# Patient Record
Sex: Male | Born: 1965
Health system: Southern US, Community
[De-identification: ages and names within clinical notes are randomized; demographics above are authoritative.]

## PROBLEM LIST (undated history)

## (undated) DIAGNOSIS — F17200 Nicotine dependence, unspecified, uncomplicated: Secondary | ICD-10-CM

## (undated) DIAGNOSIS — I739 Peripheral vascular disease, unspecified: Secondary | ICD-10-CM

## (undated) DIAGNOSIS — Z939 Artificial opening status, unspecified: Secondary | ICD-10-CM

## (undated) DIAGNOSIS — E079 Disorder of thyroid, unspecified: Secondary | ICD-10-CM

## (undated) DIAGNOSIS — I10 Essential (primary) hypertension: Secondary | ICD-10-CM

## (undated) DIAGNOSIS — N289 Disorder of kidney and ureter, unspecified: Secondary | ICD-10-CM

## (undated) DIAGNOSIS — R809 Proteinuria, unspecified: Secondary | ICD-10-CM

## (undated) DIAGNOSIS — E119 Type 2 diabetes mellitus without complications: Secondary | ICD-10-CM

## (undated) DIAGNOSIS — E669 Obesity, unspecified: Secondary | ICD-10-CM

## (undated) DIAGNOSIS — E785 Hyperlipidemia, unspecified: Secondary | ICD-10-CM

## (undated) HISTORY — DX: Essential (primary) hypertension: I10

## (undated) HISTORY — DX: Proteinuria, unspecified: R80.9

## (undated) HISTORY — DX: Hyperlipidemia, unspecified: E78.5

## (undated) HISTORY — DX: Peripheral vascular disease, unspecified: I73.9

## (undated) HISTORY — DX: Artificial opening status, unspecified: Z93.9

## (undated) HISTORY — DX: Obesity, unspecified: E66.9

## (undated) HISTORY — DX: Nicotine dependence, unspecified, uncomplicated: F17.200

## (undated) HISTORY — DX: Type 2 diabetes mellitus without complications: E11.9

---

## 1993-08-16 DIAGNOSIS — E119 Type 2 diabetes mellitus without complications: Secondary | ICD-10-CM

## 1993-08-16 HISTORY — DX: Type 2 diabetes mellitus without complications: E11.9

## 2015-12-15 DIAGNOSIS — E782 Mixed hyperlipidemia: Secondary | ICD-10-CM | POA: Diagnosis not present

## 2015-12-15 DIAGNOSIS — F17200 Nicotine dependence, unspecified, uncomplicated: Secondary | ICD-10-CM | POA: Diagnosis not present

## 2015-12-15 DIAGNOSIS — E1140 Type 2 diabetes mellitus with diabetic neuropathy, unspecified: Secondary | ICD-10-CM | POA: Diagnosis not present

## 2016-02-16 DIAGNOSIS — E782 Mixed hyperlipidemia: Secondary | ICD-10-CM | POA: Diagnosis not present

## 2016-02-16 DIAGNOSIS — Z125 Encounter for screening for malignant neoplasm of prostate: Secondary | ICD-10-CM | POA: Diagnosis not present

## 2016-02-16 DIAGNOSIS — E1140 Type 2 diabetes mellitus with diabetic neuropathy, unspecified: Secondary | ICD-10-CM | POA: Diagnosis not present

## 2016-04-26 DIAGNOSIS — E782 Mixed hyperlipidemia: Secondary | ICD-10-CM | POA: Diagnosis not present

## 2016-04-26 DIAGNOSIS — Z23 Encounter for immunization: Secondary | ICD-10-CM | POA: Diagnosis not present

## 2016-04-26 DIAGNOSIS — E1140 Type 2 diabetes mellitus with diabetic neuropathy, unspecified: Secondary | ICD-10-CM | POA: Diagnosis not present

## 2016-04-26 DIAGNOSIS — Z7984 Long term (current) use of oral hypoglycemic drugs: Secondary | ICD-10-CM | POA: Diagnosis not present

## 2016-04-26 DIAGNOSIS — F17200 Nicotine dependence, unspecified, uncomplicated: Secondary | ICD-10-CM | POA: Diagnosis not present

## 2017-02-15 DIAGNOSIS — E782 Mixed hyperlipidemia: Secondary | ICD-10-CM | POA: Diagnosis not present

## 2017-02-15 DIAGNOSIS — I10 Essential (primary) hypertension: Secondary | ICD-10-CM | POA: Diagnosis not present

## 2017-02-15 DIAGNOSIS — E1140 Type 2 diabetes mellitus with diabetic neuropathy, unspecified: Secondary | ICD-10-CM | POA: Diagnosis not present

## 2017-02-23 ENCOUNTER — Other Ambulatory Visit (HOSPITAL_COMMUNITY): Payer: Self-pay | Admitting: Family Medicine

## 2017-02-23 DIAGNOSIS — I739 Peripheral vascular disease, unspecified: Secondary | ICD-10-CM

## 2017-02-24 ENCOUNTER — Encounter (HOSPITAL_COMMUNITY): Payer: Self-pay

## 2017-03-15 ENCOUNTER — Ambulatory Visit (HOSPITAL_COMMUNITY): Payer: BLUE CROSS/BLUE SHIELD

## 2017-03-21 ENCOUNTER — Ambulatory Visit (HOSPITAL_COMMUNITY)
Admission: RE | Admit: 2017-03-21 | Discharge: 2017-03-21 | Disposition: A | Discharge status: Home / Self Care | Payer: BLUE CROSS/BLUE SHIELD | Source: Ambulatory Visit | Attending: Family Medicine | Admitting: Family Medicine

## 2017-03-21 DIAGNOSIS — I739 Peripheral vascular disease, unspecified: Secondary | ICD-10-CM | POA: Diagnosis not present

## 2017-03-21 NOTE — Progress Notes (Signed)
VASCULAR LAB PRELIMINARY  ARTERIAL  ABI completed:    RIGHT    LEFT    PRESSURE WAVEFORM  PRESSURE WAVEFORM  BRACHIAL 183 Tri BRACHIAL 173 Tri  DP 136 Bi DP 93 Mono  PT 159 Bi PT 92 Mono  GREAT TOE  NA GREAT TOE  NA    RIGHT LEFT  ABI 0.87 0.51   ABI of the right lower extremity demonstrates mild peripheral arterial disease.  ABI of the left lower extremity demonstrates moderate peripheral arterial disease.   Andres Richardson, RVT 03/21/2017, 9:30 AM

## 2017-03-29 ENCOUNTER — Other Ambulatory Visit: Payer: Self-pay

## 2017-03-29 DIAGNOSIS — I739 Peripheral vascular disease, unspecified: Secondary | ICD-10-CM

## 2017-03-30 ENCOUNTER — Encounter: Payer: Self-pay | Admitting: Surgery

## 2017-04-05 ENCOUNTER — Encounter: Payer: Self-pay | Admitting: Vascular Surgery

## 2017-04-06 ENCOUNTER — Encounter: Payer: Self-pay | Admitting: Vascular Surgery

## 2017-04-07 ENCOUNTER — Ambulatory Visit (HOSPITAL_COMMUNITY)
Admission: RE | Admit: 2017-04-07 | Discharge: 2017-04-07 | Disposition: A | Discharge status: Home / Self Care | Payer: BLUE CROSS/BLUE SHIELD | Source: Ambulatory Visit | Attending: Surgery | Admitting: Surgery

## 2017-04-07 ENCOUNTER — Ambulatory Visit (INDEPENDENT_AMBULATORY_CARE_PROVIDER_SITE_OTHER): Payer: BLUE CROSS/BLUE SHIELD | Admitting: Vascular Surgery

## 2017-04-07 ENCOUNTER — Encounter: Payer: Self-pay | Admitting: Vascular Surgery

## 2017-04-07 VITALS — BP 125/79 | HR 60 | Temp 97.5°F | Resp 20 | Ht 71.0 in | Wt 236.0 lb

## 2017-04-07 DIAGNOSIS — Z716 Tobacco abuse counseling: Secondary | ICD-10-CM

## 2017-04-07 DIAGNOSIS — I7409 Other arterial embolism and thrombosis of abdominal aorta: Secondary | ICD-10-CM

## 2017-04-07 DIAGNOSIS — I739 Peripheral vascular disease, unspecified: Secondary | ICD-10-CM | POA: Insufficient documentation

## 2017-04-07 LAB — VAS US LOWER EXTREMITY ARTERIAL DUPLEX
LEFT PERO DIST SYS: 24 cm/s
LPOPDPSV: -52 cm/s
LPOPPPSV: 37 cm/s
LSFDPSV: -56 cm/s
Left ant tibial distal sys: -13 cm/s
Left super femoral mid sys PSV: -55 cm/s
Left super femoral prox sys PSV: -41 cm/s
RIGHT ANT DIST TIBAL SYS PSV: 28 cm/s
RIGHT POST TIB DIST SYS: 68 cm/s
RPERPSV: 30 cm/s
RPOPDPSV: -56 cm/s
RSFMPSV: -86 cm/s
RSFPPSV: -79 cm/s
Right popliteal prox sys PSV: 46 cm/s
Right super femoral dist sys PSV: -70 cm/s
left post tibial dist sys: 52 cm/s

## 2017-04-07 NOTE — Progress Notes (Signed)
Patient name: Andres Richardson MRN: [id number] DOB: 04-03-1966 Sex: male   REASON FOR CONSULT:    Peripheral vascular disease with bilateral lower extremity claudication. The consult is requested by Dr. [name].   HPI:   Andres Richardson is a pleasant 51 y.o. male,  Who is had bilateral lower extreme claudication for about 18 months. He experiences pain in both calves and feet which is brought on by ambulation and relieved with rest. There are no other aggravating or alleviating factors. His symptoms are much worse on the left side. He denies any significant thigh or hip claudication. He also describes rest pain in the left foot.  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 He denies any history of nonhealing wounds.  He also notes some hyperpigmentation in both lower extremities and leg swelling. This is worse when his been driving a lot. He spends long hours on the road.  He does not take aspirin on a regular basis. He does not tolerate statins.  I have reviewed the records that were sent from the referring office. The patient has a history of type 2 diabetes which is controlled on oral medications. The patient also has hypertension and hyperlipidemia.  Past Medical History:  Diagnosis Date  . Diabetes mellitus without complication (HCC)    Type !! 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 Diabetic neuropathy.  . Hyperlipidemia    Mixed  . Hypertension    Essential  . Microalbuminuria   . Obesity   . Peripheral arterial disease (HCC)    With Claudication  . Tobacco use disorder     No family history on file.  There is no family history of premature cardiovascular disease.  SOCIAL HISTORY: He has cut back to 1 pack per day. He had smoked up to 2-1/2 packs per day up to a year ago. He began smoking when he was 51 years old.  Social History   Social  History  . Marital status: Single    Spouse name: N/A  . Number of children: N/A  . Years of education: N/A   Occupational History  . Not on file.   Social History Main Topics  . Smoking status: Current Every Day Smoker    Packs/day: 1.00  . Smokeless tobacco: Never Used     Comment: Uses Nicorette Gum.   . Alcohol use Not on file  . Drug use: Unknown  . Sexual activity: Not on file   Other Topics Concern  . Not on file   Social History Narrative  . No narrative on file    Allergies  Allergen Reactions  . Adhesive [Tape] Rash    Current Outpatient Prescriptions  Medication Sig Dispense Refill  . buPROPion (WELLBUTRIN XL) 150 MG 24 hr tablet Take 150 mg by mouth 2 (two) times daily.    Marland Kitchen GLIPIZIDE PO Take 5 mg by mouth 1 day or 1 dose.    . ibuprofen (ADVIL,MOTRIN) 200 MG tablet Take 200 mg by mouth every 6 (six) hours as needed.    . metFORMIN (GLUCOPHAGE-XR) 750 MG 24 hr tablet Take 750 mg by mouth 2 (two) times daily.    . nicotine polacrilex (NICORETTE) 4 MG gum Take 4 mg by mouth as needed for smoking cessation.    . sildenafil (REVATIO) 20 MG tablet Take 20 mg by  mouth 3 (three) times daily. 2-5 tab Prn    . sildenafil (VIAGRA) 25 MG tablet Take 25 mg by mouth daily as needed for erectile dysfunction.     No current facility-administered medications for this visit.     REVIEW OF SYSTEMS:  [X]  denotes positive finding, [ ]  denotes negative finding Cardiac  Comments:  Chest pain or chest pressure:    Shortness of breath upon exertion:    Short of breath when lying flat:    Irregular heart rhythm:        Vascular    Pain in calf, thigh, or hip brought on by ambulation: X   Pain in feet at night that wakes you up from your sleep:  X   Blood clot in your veins:    Leg swelling:  X       Pulmonary    Oxygen at home:    Productive cough:     Wheezing:         Neurologic    Sudden weakness in arms or legs:     Sudden numbness in arms or legs:     Sudden  onset of difficulty speaking or slurred speech:    Temporary loss of vision in one eye:     Problems with dizziness:         Gastrointestinal    Blood in stool:     Vomited blood:         Genitourinary    Burning when urinating:     Blood in urine:        Psychiatric    Major depression:         Hematologic    Bleeding problems:    Problems with blood clotting too easily:        Skin    Rashes or ulcers:        Constitutional    Fever or chills:     PHYSICAL EXAM:   Vitals:   04/07/17 1414  BP: 125/79  Pulse: 60  Resp: 20  Temp: (!) 97.5 F (36.4 C)  TempSrc: Oral  SpO2: 97%  Weight: 236 lb (107 kg)  Height: 5\' 11"  (1.803 m)    GENERAL: The patient is a well-nourished male, in no acute distress. The vital signs are documented above. CARDIAC: There is a regular rate and rhythm.  VASCULAR: I do not detect carotid bruits. I cannot palpate femoral, popliteal, or pedal pulses bilaterally. He has no significant lower extremity swelling. He has hyperpigmentation bilaterally consistent with chronic venous insufficiency. PULMONARY: There is good air exchange bilaterally without wheezing or rales. ABDOMEN: Soft and non-tender with normal pitched bowel sounds. Because of his obesity, it is difficult to palpate for an aneurysm. I cannot palpate an aneurysm. MUSCULOSKELETAL: There are no major deformities or cyanosis. NEUROLOGIC: No focal weakness or paresthesias are detected. SKIN: There are no ulcers or rashes noted. PSYCHIATRIC: The patient has a normal affect.  DATA:    ARTERIAL DOPPLER STUDY: I have reviewed the arterial Doppler study that was done on 03/21/2017 at [facility].  On the right side, there was a biphasic dorsalis pedis and posterior tibial signal with an ABI of 87%.  On the left side it was a monophasic dorsalis pedis and posterior tibial signal with an ABI of 51%.  ARTERIAL DUPLEX: I have independently interpreted his bilateral lower extremity arterial  duplex can today.  On the right side there are monophasic Doppler signals from the common femoral artery distally.  On  the left side there are monophasic Doppler signals from the common femoral artery distally.  By duplex there is no significant infrainguinal arterial occlusive disease noted bilaterally. The patient has evidence of aortoiliac occlusive disease.  MEDICAL ISSUES:   AORTOILIAC OCCLUSIVE DISEASE: Based on his exam and noninvasive studies I think the patient has significant aortoiliac occlusive disease. I cannot palpate femoral pulses. Given his having rest pain and disabling claudication, I think that we should consider revascularization. I've explained that normally I would recommend an arteriogram. However given that he has absent femoral pulses I think would be better to start with a CT angiogram. If this shows that he has disease amenable to angioplasty then certainly we could consider this. Otherwise it would provide a roadmap for possible aortobifemoral bypass grafting. Certainly if her heart surgery he would need preoperative cardiac evaluation. We have also discussed the option of conservative treatment which would be to try to get off the cigarettes completely, which she is working on, and a structured walking program. However symptoms are significantly disabling and he progressed to the point of rest pain so that I feel that this option is likely not going to be successful.  He currently does not take aspirin and I have instructed him to begin taking 81 mg of aspirin daily. He does not tolerate statins.  He will have to work around his work schedule but hopefully can schedule a CT Carlyon Prows in the near future. I'll see him back after discussed these results and make further recommendations.  CHRONIC VENOUS INSUFFICIENCY: Based on his exam, he does have evidence of chronic venous insufficiency with hyperpigmentation bilaterally. I've explained that normally the treatment for  this would be intermittent leg elevation and compression stockings. However, given his severe peripheral vascular disease, he really cannot elevate his legs. Therefore we will simply follow this and if he ultimately returned undergoes revascularization we can rediscuss these conservative measures for the treatment of his chronic venous insufficiency. 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  We have also had a long discussion about the importance of tobacco cessation. I spent more than 3 minutes on this conversation.  Waverly Ferrari [facility] [place] [phone]

## 2017-04-07 NOTE — Addendum Note (Signed)
Addended by: Burton Apley A on: 04/07/2017 03:22 PM   Modules accepted: Orders

## 2017-04-11 ENCOUNTER — Other Ambulatory Visit: Payer: BLUE CROSS/BLUE SHIELD

## 2017-04-13 ENCOUNTER — Ambulatory Visit: Payer: BLUE CROSS/BLUE SHIELD | Admitting: Vascular Surgery

## 2017-05-04 ENCOUNTER — Ambulatory Visit
Admission: RE | Admit: 2017-05-04 | Discharge: 2017-05-04 | Disposition: A | Discharge status: Home / Self Care | Payer: BLUE CROSS/BLUE SHIELD | Source: Ambulatory Visit | Attending: Vascular Surgery | Admitting: Vascular Surgery

## 2017-05-04 ENCOUNTER — Ambulatory Visit (INDEPENDENT_AMBULATORY_CARE_PROVIDER_SITE_OTHER): Payer: BLUE CROSS/BLUE SHIELD | Admitting: Vascular Surgery

## 2017-05-04 ENCOUNTER — Encounter: Payer: Self-pay | Admitting: Vascular Surgery

## 2017-05-04 VITALS — BP 150/86 | HR 97 | Temp 97.5°F | Resp 20 | Ht 71.0 in | Wt 237.0 lb

## 2017-05-04 DIAGNOSIS — I7409 Other arterial embolism and thrombosis of abdominal aorta: Secondary | ICD-10-CM

## 2017-05-04 DIAGNOSIS — R161 Splenomegaly, not elsewhere classified: Secondary | ICD-10-CM | POA: Diagnosis not present

## 2017-05-04 MED ORDER — IOPAMIDOL (ISOVUE-370) INJECTION 76%
125.0000 mL | Freq: Once | INTRAVENOUS | Status: AC | PRN
  Administered 2017-05-04: 125 mL via INTRAVENOUS

## 2017-05-04 NOTE — Progress Notes (Signed)
Patient name: Andres Richardson MRN: [id number] DOB: 12-Jul-1966 Sex: male  REASON FOR VISIT:    Follow up of aortoiliac occlusive disease.  HPI:   Andres Richardson is a pleasant 51 y.o. male who I saw in consultation on 04/07/2017 with bilateral lower extremity claudication. The symptoms had been going on for 18 months. Arterial Doppler study showed an ABI of 87% on the right and 51% on the left. I could not palpate femoral pulses. I recommended a CT angiogram. I also encouraged him to begin taking aspirin daily. We also discussed the importance of tobacco cessation. He also had evidence of chronic venous insufficiency.  Since I saw him last his pain remains stable. He has claudication of both thighs and calves which is brought on by ambulation and relieved with rest. He also has some mild rest pain bilaterally. His symptoms are worse on the left side.  He is continuing to work on tobacco cessation. He had smoked 2-1/2 packs per day but has cut back to 8-9 cigarettes a day. 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 20 laps around the truck as 1 mile. He is able to walk 6-7 laps around his truck.   Past Medical History:  Diagnosis Date  . Diabetes mellitus without complication (HCC)    Type !! 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 Diabetic neuropathy.  . Hyperlipidemia    Mixed  . Hypertension    Essential  . Microalbuminuria   . Obesity   . Peripheral arterial disease (HCC)    With Claudication  . Tobacco use disorder     No family history on file.  SOCIAL HISTORY: Social History  Substance Use Topics  . Smoking status: Current Every Day Smoker    Packs/day: 1.00  . Smokeless tobacco: Never Used     Comment: Uses Nicorette Gum.   . Alcohol use Not on file    Allergies  Allergen Reactions  . Adhesive [Tape] Rash    Current Outpatient Prescriptions  Medication Sig Dispense Refill  . buPROPion (WELLBUTRIN XL) 150 MG 24 hr tablet Take 150 mg by mouth 2  (two) times daily.    Marland Kitchen GLIPIZIDE PO Take 5 mg by mouth 1 day or 1 dose.    . ibuprofen (ADVIL,MOTRIN) 200 MG tablet Take 200 mg by mouth every 6 (six) hours as needed.    . metFORMIN (GLUCOPHAGE-XR) 750 MG 24 hr tablet Take 750 mg by mouth 2 (two) times daily.    . nicotine polacrilex (NICORETTE) 4 MG gum Take 4 mg by mouth as needed for smoking cessation.    . sildenafil (REVATIO) 20 MG tablet Take 20 mg by mouth 3 (three) times daily. 2-5 tab Prn    . sildenafil (VIAGRA) 25 MG tablet Take 25 mg by mouth daily as needed for erectile dysfunction.     No current facility-administered medications for this visit.     REVIEW OF SYSTEMS:   denotes positive finding,  denotes negative finding Cardiac  Comments:  Chest pain or chest pressure:    Shortness of breath upon exertion:    Short of breath when lying flat:    Irregular heart rhythm:        Vascular    Pain in calf, thigh, or hip brought on by ambulation:    Pain in feet at night that wakes you up from your sleep:     Blood clot in your veins:    Leg swelling:  Pulmonary    Oxygen at home:    Productive cough:     Wheezing:         Neurologic    Sudden weakness in arms or legs:     Sudden numbness in arms or legs:     Sudden onset of difficulty speaking or slurred speech:    Temporary loss of vision in one eye:     Problems with dizziness:         Gastrointestinal    Blood in stool:     Vomited blood:         Genitourinary    Burning when urinating:     Blood in urine:        Psychiatric    Major depression:         Hematologic    Bleeding problems:    Problems with blood clotting too easily:        Skin    Rashes or ulcers:        Constitutional    Fever or chills:     PHYSICAL EXAM:   Vitals:   05/04/17 1132  BP: (!) 150/86  Pulse: 97  Resp: 20  Temp: (!) 97.5 F (36.4 C)  SpO2: 99%  Weight: 237 lb (107.5 kg)  Height:  (1.803 m)    GENERAL: The patient is a well-nourished  male, in no acute distress. The vital signs are documented above. CARDIAC: There is a regular rate and rhythm.  VASCULAR: I do not detect carotid bruits. I cannot palpate femoral pulses.  I cannot palpate pedal pulses. PULMONRY: There is good air exchange bilaterally without wheezing or rales. ABDOMEN: Soft and non-tender with normal pitched bowel sounds.  MUSCULOSKELETAL: There are no major deformities or cyanosis. NEUROLOGIC: No focal weakness or paresthesias are detected. SKIN: There are no ulcers or rashes noted. PSYCHIATRIC: The patient has a normal affect.  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 He has mild arterial disease on the right with extensive plaque in the abdominal aorta also.  MEDICAL ISSUES:   AORTOILIAC OCCLUSIVE DISEASE:  This patient has diffuse disease of his infrarenal aorta although no critical stenosis. The right common iliac artery has some moderate diffuse disease but the artery appears patent. However I am unable to palpate a right femoral pulse. 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 We have discussed the options today in the office. Conservative treatment would include continued efforts to get off nicotine completely. We've also discussed the importance of continuing to walk as much as possible and again the importance of nutrition.   If this is not successful then we could consider arteriography and potentially he could have an endovascular approach on the right opening up the option for a right to left femorofemoral bypass graft. However, if he has significant infrarenal aortic disease and this would compromise the inflow on the right and the other consideration would be aortofemoral bypass grafting. We have discussed the risks and benefits of all these options and he is agreeable to attempt a conservative  approach. He is not really anxious to consider aortobifemoral bypass grafting at this point. If he does ultimately come to that and certainly he would require preoperative cardiac clearance.   I have ordered follow up ABIs in 6 months and I'll see him back at that time. He  knows to call sooner if he has problems.   Waverly Ferrari [facility] [place] [phone]

## 2017-05-05 NOTE — Addendum Note (Signed)
Addended by: Burton Apley A on: 05/05/2017 03:55 PM   Modules accepted: Orders

## 2017-05-16 ENCOUNTER — Encounter (HOSPITAL_COMMUNITY): Payer: BLUE CROSS/BLUE SHIELD

## 2017-05-16 ENCOUNTER — Encounter: Payer: BLUE CROSS/BLUE SHIELD | Admitting: Surgery

## 2017-09-02 DIAGNOSIS — H52223 Regular astigmatism, bilateral: Secondary | ICD-10-CM | POA: Diagnosis not present

## 2017-11-02 ENCOUNTER — Encounter: Payer: Self-pay | Admitting: Vascular Surgery

## 2017-11-02 ENCOUNTER — Other Ambulatory Visit: Payer: Self-pay

## 2017-11-02 ENCOUNTER — Ambulatory Visit (HOSPITAL_COMMUNITY)
Admission: RE | Admit: 2017-11-02 | Discharge: 2017-11-02 | Disposition: A | Discharge status: Home / Self Care | Payer: BLUE CROSS/BLUE SHIELD | Source: Ambulatory Visit | Attending: Vascular Surgery | Admitting: Vascular Surgery

## 2017-11-02 ENCOUNTER — Ambulatory Visit: Payer: BLUE CROSS/BLUE SHIELD | Admitting: Vascular Surgery

## 2017-11-02 VITALS — BP 153/89 | HR 87 | Resp 20 | Ht 71.0 in | Wt 235.0 lb

## 2017-11-02 DIAGNOSIS — I7409 Other arterial embolism and thrombosis of abdominal aorta: Secondary | ICD-10-CM | POA: Diagnosis not present

## 2017-11-02 NOTE — Progress Notes (Signed)
Patient name: Andres Richardson MRN: [id number] DOB: 1965-11-08 Sex: male  REASON FOR VISIT:   Follow-up of aortoiliac occlusive disease  HPI:   Andres Richardson is a pleasant 52 y.o. male who I last saw on 05/04/2017.  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  He had mild arterial disease on the right with extensive plaque in the abdominal aorta also.  There was diffuse infrarenal aortic disease but no critical stenosis.  The right common iliac artery had some moderate diffuse disease but was patent.  I was unable to palpate femoral pulses on either side.  We discussed the importance of tobacco cessation.  My feeling at that time was that we should try conservative treatment but that if this was not successful we could consider arteriography and possibly an endovascular approach on the right opening up the option for a right to left femorofemoral bypass.  The other consideration would be aortofemoral bypass grafting.  Since I saw him last his symptoms remain stable.  He experiences pain mostly in his feet which is brought on by ambulation and relieved with rest.  The symptoms have not progressed over the last 6 months.  He denies significant hip or thigh claudication although he occasionally gets some pain in the left hip which sounds potentially like arthritic pain.  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  He denies any history of rest pain or nonhealing ulcers.  Of note the places he has been traveling recently are quite cold to he is not been walking as much lately.  He does take 81 mg of aspirin a day although this is not listed on his medications.  He is not on a statin.  He continues to try to work on tobacco cessation.  He had smoked 2-1/2 packs/day for a long time but is cut back to 8-12 cigarettes a day.  He denies any  previous history of DVT or phlebitis.  Past Medical History:  Diagnosis Date  . Diabetes mellitus without complication (HCC)    Type !! 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 Diabetic neuropathy.  . Hyperlipidemia    Mixed  . Hypertension    Essential  . Microalbuminuria   . Obesity   . Peripheral arterial disease (HCC)    With Claudication  . Tobacco use disorder     History reviewed. No pertinent family history.  SOCIAL HISTORY: Currently 8-12 cigarettes a day. Social History   Tobacco Use  . Smoking status: Current Every Day Smoker    Packs/day: 1.00  . Smokeless tobacco: Never Used  . Tobacco comment: Uses Nicorette Gum.   Substance Use Topics  . Alcohol use: Not on file    Allergies  Allergen Reactions  . Adhesive [Tape] Rash    Current Outpatient Medications  Medication Sig Dispense Refill  . buPROPion (WELLBUTRIN XL) 150 MG 24 hr tablet Take 150 mg by mouth 2 (two) times daily.    Marland Kitchen GLIPIZIDE PO Take 5 mg by mouth 1 day or 1 dose.    . ibuprofen (ADVIL,MOTRIN) 200 MG tablet Take 200 mg by mouth every 6 (six) hours as needed.    . metFORMIN (GLUCOPHAGE-XR) 750 MG 24 hr tablet Take 750 mg by mouth 2 (two) times daily.    . nicotine polacrilex (NICORETTE) 4 MG gum Take 4 mg by mouth as  needed for smoking cessation.    . sildenafil (REVATIO) 20 MG tablet Take 20 mg by mouth 3 (three) times daily. 2-5 tab Prn    . sildenafil (VIAGRA) 25 MG tablet Take 25 mg by mouth daily as needed for erectile dysfunction.     No current facility-administered medications for this visit.     REVIEW OF SYSTEMS:  [X]  denotes positive finding, [ ]  denotes negative finding Cardiac  Comments:  Chest pain or chest pressure:    Shortness of breath upon exertion:    Short of breath when lying flat:    Irregular heart rhythm:        Vascular    Pain in calf, thigh, or hip brought on by ambulation: x   Pain in feet at night that wakes you up from your sleep:  x   Blood clot  in your veins:    Leg swelling:  x       Pulmonary    Oxygen at home:    Productive cough:     Wheezing:         Neurologic    Sudden weakness in arms or legs:     Sudden numbness in arms or legs:     Sudden onset of difficulty speaking or slurred speech:    Temporary loss of vision in one eye:     Problems with dizziness:         Gastrointestinal    Blood in stool:     Vomited blood:         Genitourinary    Burning when urinating:     Blood in urine:        Psychiatric    Major depression:         Hematologic    Bleeding problems:    Problems with blood clotting too easily:        Skin    Rashes or ulcers:        Constitutional    Fever or chills:     PHYSICAL EXAM:   Vitals:   11/02/17 0943 11/02/17 0946  BP: (!) 155/88 (!) 153/89  Pulse: 87   Resp: 20   SpO2: 98%   Weight: 235 lb (106.6 kg)   Height: 5\' 11"  (1.803 m)     GENERAL: The patient is a well-nourished male, in no acute distress. The vital signs are documented above. CARDIAC: There is a regular rate and rhythm.  VASCULAR: I do not detect carotid bruits. On the right side he has a palpable femoral pulse.  It is slightly diminished.  I cannot palpate a popliteal or pedal pulses. On the left side I cannot palpate a femoral pulse.  I cannot palpate pedal pulses.  Both feet are warm and well perfused. PULMONARY: There is good air exchange bilaterally without wheezing or rales. ABDOMEN: Soft and non-tender with normal pitched bowel sounds.  I do not palpate an abdominal aortic aneurysm. MUSCULOSKELETAL: There are no major deformities or cyanosis. NEUROLOGIC: No focal weakness or paresthesias are detected. SKIN: There are no ulcers or rashes noted.  He does have some hyperpigmentation bilaterally consistent with chronic venous insufficiency. PSYCHIATRIC: The patient has a normal affect.  DATA:    ARTERIAL DOPPLER STUDY: I have independently interpreted his arterial Doppler study.  On the right  side has a monophasic dorsalis pedis signal with a biphasic posterior tibial signal.  ABI is 84%.  Toe pressure is 85 mmHg.  On the left side, he has a  monophasic posterior tibial signal.  A dorsalis pedis signal cannot be obtained.  ABI  is 55%.  Toe pressure is 52 mmHg  MEDICAL ISSUES:   MULTILEVEL ARTERIAL OCCLUSIVE DISEASE: This patient has aortoiliac occlusive disease with a left common iliac artery occlusion and also infrainguinal arterial occlusive disease on the right.  When I saw him previously we discussed conservative treatment with hopes of avoiding aortofemoral bypass grafting or an endovascular approach to the right iliac disease with a femorofemoral bypass.  His symptoms have improved since last time he continues to work on tobacco cessation.  We again discussed the importance of exercise and maintaining a structured walking routine.  We also discussed the importance of nutrition.  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  I think this is very encouraging.  I have encouraged him to try to get off the cigarettes completely.  Given that his symptoms are stable I have stretched his follow-up out to 1 year.  I have ordered follow-up ABIs at that time and I will see him back at that time.  He knows to call sooner if he has problems.    CHRONIC VENOUS INSUFFICIENCY: On exam he does have some evidence of chronic venous insufficiency.  I encouraged him to elevate his legs some on long trips if possible and also to consider wearing compression stockings.  He did not want a prescription for these today.  I will see him back in 1 year.  He knows to call sooner if he has problems.    Waverly Ferrari [facility] [phone]

## 2018-04-01 DIAGNOSIS — J811 Chronic pulmonary edema: Secondary | ICD-10-CM | POA: Diagnosis not present

## 2018-04-01 DIAGNOSIS — S36530A Laceration of ascending [right] colon, initial encounter: Secondary | ICD-10-CM | POA: Diagnosis not present

## 2018-04-01 DIAGNOSIS — J181 Lobar pneumonia, unspecified organism: Secondary | ICD-10-CM | POA: Diagnosis not present

## 2018-04-01 DIAGNOSIS — R0989 Other specified symptoms and signs involving the circulatory and respiratory systems: Secondary | ICD-10-CM | POA: Diagnosis not present

## 2018-04-01 DIAGNOSIS — J810 Acute pulmonary edema: Secondary | ICD-10-CM | POA: Diagnosis not present

## 2018-04-01 DIAGNOSIS — J9584 Transfusion-related acute lung injury (TRALI): Secondary | ICD-10-CM | POA: Diagnosis not present

## 2018-04-01 DIAGNOSIS — M40204 Unspecified kyphosis, thoracic region: Secondary | ICD-10-CM | POA: Diagnosis not present

## 2018-04-01 DIAGNOSIS — J9601 Acute respiratory failure with hypoxia: Secondary | ICD-10-CM | POA: Diagnosis not present

## 2018-04-01 DIAGNOSIS — R1011 Right upper quadrant pain: Secondary | ICD-10-CM | POA: Diagnosis not present

## 2018-04-01 DIAGNOSIS — R0902 Hypoxemia: Secondary | ICD-10-CM | POA: Diagnosis not present

## 2018-04-01 DIAGNOSIS — F17210 Nicotine dependence, cigarettes, uncomplicated: Secondary | ICD-10-CM | POA: Diagnosis not present

## 2018-04-01 DIAGNOSIS — Z4682 Encounter for fitting and adjustment of non-vascular catheter: Secondary | ICD-10-CM | POA: Diagnosis not present

## 2018-04-01 DIAGNOSIS — S31630A Puncture wound without foreign body of abdominal wall, right upper quadrant with penetration into peritoneal cavity, initial encounter: Secondary | ICD-10-CM | POA: Diagnosis not present

## 2018-04-01 DIAGNOSIS — W3400XA Accidental discharge from unspecified firearms or gun, initial encounter: Secondary | ICD-10-CM | POA: Diagnosis not present

## 2018-04-01 DIAGNOSIS — S36409A Unspecified injury of unspecified part of small intestine, initial encounter: Secondary | ICD-10-CM | POA: Diagnosis not present

## 2018-04-01 DIAGNOSIS — J189 Pneumonia, unspecified organism: Secondary | ICD-10-CM | POA: Diagnosis not present

## 2018-04-01 DIAGNOSIS — S31109A Unspecified open wound of abdominal wall, unspecified quadrant without penetration into peritoneal cavity, initial encounter: Secondary | ICD-10-CM | POA: Diagnosis not present

## 2018-04-01 DIAGNOSIS — E8770 Fluid overload, unspecified: Secondary | ICD-10-CM | POA: Diagnosis not present

## 2018-04-01 DIAGNOSIS — D649 Anemia, unspecified: Secondary | ICD-10-CM | POA: Diagnosis not present

## 2018-04-01 DIAGNOSIS — R079 Chest pain, unspecified: Secondary | ICD-10-CM | POA: Diagnosis not present

## 2018-04-01 DIAGNOSIS — K5900 Constipation, unspecified: Secondary | ICD-10-CM | POA: Diagnosis not present

## 2018-04-01 DIAGNOSIS — I708 Atherosclerosis of other arteries: Secondary | ICD-10-CM | POA: Diagnosis not present

## 2018-04-01 DIAGNOSIS — I471 Supraventricular tachycardia: Secondary | ICD-10-CM | POA: Diagnosis not present

## 2018-04-01 DIAGNOSIS — S31139A Puncture wound of abdominal wall without foreign body, unspecified quadrant without penetration into peritoneal cavity, initial encounter: Secondary | ICD-10-CM | POA: Diagnosis not present

## 2018-04-01 DIAGNOSIS — T1490XA Injury, unspecified, initial encounter: Secondary | ICD-10-CM | POA: Diagnosis not present

## 2018-04-01 DIAGNOSIS — R0603 Acute respiratory distress: Secondary | ICD-10-CM | POA: Diagnosis not present

## 2018-04-01 DIAGNOSIS — A419 Sepsis, unspecified organism: Secondary | ICD-10-CM | POA: Diagnosis not present

## 2018-04-01 DIAGNOSIS — R109 Unspecified abdominal pain: Secondary | ICD-10-CM | POA: Diagnosis not present

## 2018-04-01 DIAGNOSIS — Y249XXA Unspecified firearm discharge, undetermined intent, initial encounter: Secondary | ICD-10-CM | POA: Diagnosis not present

## 2018-04-01 DIAGNOSIS — R918 Other nonspecific abnormal finding of lung field: Secondary | ICD-10-CM | POA: Diagnosis not present

## 2018-04-01 DIAGNOSIS — T794XXA Traumatic shock, initial encounter: Secondary | ICD-10-CM | POA: Diagnosis not present

## 2018-04-01 DIAGNOSIS — S36438A Laceration of other part of small intestine, initial encounter: Secondary | ICD-10-CM | POA: Diagnosis not present

## 2018-04-01 DIAGNOSIS — R0602 Shortness of breath: Secondary | ICD-10-CM | POA: Diagnosis not present

## 2018-04-01 DIAGNOSIS — E119 Type 2 diabetes mellitus without complications: Secondary | ICD-10-CM | POA: Diagnosis not present

## 2018-04-01 DIAGNOSIS — S31130A Puncture wound of abdominal wall without foreign body, right upper quadrant without penetration into peritoneal cavity, initial encounter: Secondary | ICD-10-CM | POA: Diagnosis not present

## 2018-04-01 DIAGNOSIS — S36509A Unspecified injury of unspecified part of colon, initial encounter: Secondary | ICD-10-CM | POA: Diagnosis not present

## 2018-04-01 DIAGNOSIS — Z9889 Other specified postprocedural states: Secondary | ICD-10-CM | POA: Diagnosis not present

## 2018-04-01 DIAGNOSIS — R008 Other abnormalities of heart beat: Secondary | ICD-10-CM | POA: Diagnosis not present

## 2018-04-01 DIAGNOSIS — R000 Tachycardia, unspecified: Secondary | ICD-10-CM | POA: Diagnosis not present

## 2018-04-01 DIAGNOSIS — R0600 Dyspnea, unspecified: Secondary | ICD-10-CM | POA: Diagnosis not present

## 2018-04-02 DIAGNOSIS — J9601 Acute respiratory failure with hypoxia: Secondary | ICD-10-CM | POA: Diagnosis not present

## 2018-04-02 DIAGNOSIS — S31130A Puncture wound of abdominal wall without foreign body, right upper quadrant without penetration into peritoneal cavity, initial encounter: Secondary | ICD-10-CM | POA: Diagnosis not present

## 2018-04-02 DIAGNOSIS — T794XXA Traumatic shock, initial encounter: Secondary | ICD-10-CM | POA: Diagnosis not present

## 2018-04-03 DIAGNOSIS — J9601 Acute respiratory failure with hypoxia: Secondary | ICD-10-CM | POA: Diagnosis not present

## 2018-04-03 DIAGNOSIS — R1011 Right upper quadrant pain: Secondary | ICD-10-CM | POA: Diagnosis not present

## 2018-04-03 DIAGNOSIS — R0902 Hypoxemia: Secondary | ICD-10-CM | POA: Diagnosis not present

## 2018-04-03 DIAGNOSIS — D649 Anemia, unspecified: Secondary | ICD-10-CM | POA: Diagnosis not present

## 2018-04-03 DIAGNOSIS — E119 Type 2 diabetes mellitus without complications: Secondary | ICD-10-CM | POA: Diagnosis not present

## 2018-04-04 DIAGNOSIS — R1011 Right upper quadrant pain: Secondary | ICD-10-CM | POA: Diagnosis not present

## 2018-04-04 DIAGNOSIS — J9601 Acute respiratory failure with hypoxia: Secondary | ICD-10-CM | POA: Diagnosis not present

## 2018-04-04 DIAGNOSIS — D649 Anemia, unspecified: Secondary | ICD-10-CM | POA: Diagnosis not present

## 2018-04-04 DIAGNOSIS — E119 Type 2 diabetes mellitus without complications: Secondary | ICD-10-CM | POA: Diagnosis not present

## 2018-04-04 DIAGNOSIS — R918 Other nonspecific abnormal finding of lung field: Secondary | ICD-10-CM | POA: Diagnosis not present

## 2018-04-04 DIAGNOSIS — R0902 Hypoxemia: Secondary | ICD-10-CM | POA: Diagnosis not present

## 2018-04-05 DIAGNOSIS — J189 Pneumonia, unspecified organism: Secondary | ICD-10-CM | POA: Diagnosis not present

## 2018-04-05 DIAGNOSIS — R1011 Right upper quadrant pain: Secondary | ICD-10-CM | POA: Diagnosis not present

## 2018-04-05 DIAGNOSIS — E119 Type 2 diabetes mellitus without complications: Secondary | ICD-10-CM | POA: Diagnosis not present

## 2018-04-05 DIAGNOSIS — D649 Anemia, unspecified: Secondary | ICD-10-CM | POA: Diagnosis not present

## 2018-04-05 DIAGNOSIS — J9601 Acute respiratory failure with hypoxia: Secondary | ICD-10-CM | POA: Diagnosis not present

## 2018-04-05 DIAGNOSIS — R0902 Hypoxemia: Secondary | ICD-10-CM | POA: Diagnosis not present

## 2018-04-06 DIAGNOSIS — D649 Anemia, unspecified: Secondary | ICD-10-CM | POA: Diagnosis not present

## 2018-04-06 DIAGNOSIS — R0902 Hypoxemia: Secondary | ICD-10-CM | POA: Diagnosis not present

## 2018-04-06 DIAGNOSIS — J9601 Acute respiratory failure with hypoxia: Secondary | ICD-10-CM | POA: Diagnosis not present

## 2018-04-06 DIAGNOSIS — E119 Type 2 diabetes mellitus without complications: Secondary | ICD-10-CM | POA: Diagnosis not present

## 2018-04-06 DIAGNOSIS — R1011 Right upper quadrant pain: Secondary | ICD-10-CM | POA: Diagnosis not present

## 2018-04-06 DIAGNOSIS — J810 Acute pulmonary edema: Secondary | ICD-10-CM | POA: Diagnosis not present

## 2018-04-07 DIAGNOSIS — J811 Chronic pulmonary edema: Secondary | ICD-10-CM | POA: Diagnosis not present

## 2018-04-07 DIAGNOSIS — J9601 Acute respiratory failure with hypoxia: Secondary | ICD-10-CM | POA: Diagnosis not present

## 2018-04-07 DIAGNOSIS — D649 Anemia, unspecified: Secondary | ICD-10-CM | POA: Diagnosis not present

## 2018-04-07 DIAGNOSIS — R1011 Right upper quadrant pain: Secondary | ICD-10-CM | POA: Diagnosis not present

## 2018-04-07 DIAGNOSIS — E119 Type 2 diabetes mellitus without complications: Secondary | ICD-10-CM | POA: Diagnosis not present

## 2018-04-07 DIAGNOSIS — R0902 Hypoxemia: Secondary | ICD-10-CM | POA: Diagnosis not present

## 2018-04-08 DIAGNOSIS — R0902 Hypoxemia: Secondary | ICD-10-CM | POA: Diagnosis not present

## 2018-04-08 DIAGNOSIS — E8770 Fluid overload, unspecified: Secondary | ICD-10-CM | POA: Diagnosis not present

## 2018-04-08 DIAGNOSIS — R079 Chest pain, unspecified: Secondary | ICD-10-CM | POA: Diagnosis not present

## 2018-04-09 DIAGNOSIS — R918 Other nonspecific abnormal finding of lung field: Secondary | ICD-10-CM | POA: Diagnosis not present

## 2018-04-09 DIAGNOSIS — Z4682 Encounter for fitting and adjustment of non-vascular catheter: Secondary | ICD-10-CM | POA: Diagnosis not present

## 2018-04-09 DIAGNOSIS — J9601 Acute respiratory failure with hypoxia: Secondary | ICD-10-CM | POA: Diagnosis not present

## 2018-04-09 DIAGNOSIS — R0902 Hypoxemia: Secondary | ICD-10-CM | POA: Diagnosis not present

## 2018-04-09 DIAGNOSIS — S31130A Puncture wound of abdominal wall without foreign body, right upper quadrant without penetration into peritoneal cavity, initial encounter: Secondary | ICD-10-CM | POA: Diagnosis not present

## 2018-04-09 DIAGNOSIS — E119 Type 2 diabetes mellitus without complications: Secondary | ICD-10-CM | POA: Diagnosis not present

## 2018-04-10 DIAGNOSIS — R0600 Dyspnea, unspecified: Secondary | ICD-10-CM | POA: Diagnosis not present

## 2018-04-10 DIAGNOSIS — R0989 Other specified symptoms and signs involving the circulatory and respiratory systems: Secondary | ICD-10-CM | POA: Diagnosis not present

## 2018-04-10 DIAGNOSIS — E8770 Fluid overload, unspecified: Secondary | ICD-10-CM | POA: Diagnosis not present

## 2018-04-10 DIAGNOSIS — R0902 Hypoxemia: Secondary | ICD-10-CM | POA: Diagnosis not present

## 2018-04-11 DIAGNOSIS — R918 Other nonspecific abnormal finding of lung field: Secondary | ICD-10-CM | POA: Diagnosis not present

## 2018-04-11 DIAGNOSIS — R0902 Hypoxemia: Secondary | ICD-10-CM | POA: Diagnosis not present

## 2018-04-11 DIAGNOSIS — R0989 Other specified symptoms and signs involving the circulatory and respiratory systems: Secondary | ICD-10-CM | POA: Diagnosis not present

## 2018-04-13 DIAGNOSIS — R079 Chest pain, unspecified: Secondary | ICD-10-CM | POA: Diagnosis not present

## 2018-04-13 DIAGNOSIS — R0602 Shortness of breath: Secondary | ICD-10-CM | POA: Diagnosis not present

## 2018-04-14 DIAGNOSIS — E119 Type 2 diabetes mellitus without complications: Secondary | ICD-10-CM | POA: Diagnosis not present

## 2018-04-14 DIAGNOSIS — J9601 Acute respiratory failure with hypoxia: Secondary | ICD-10-CM | POA: Diagnosis not present

## 2018-04-14 DIAGNOSIS — R1011 Right upper quadrant pain: Secondary | ICD-10-CM | POA: Diagnosis not present

## 2018-04-14 DIAGNOSIS — D649 Anemia, unspecified: Secondary | ICD-10-CM | POA: Diagnosis not present

## 2018-04-17 DIAGNOSIS — A419 Sepsis, unspecified organism: Secondary | ICD-10-CM | POA: Diagnosis not present

## 2018-04-17 DIAGNOSIS — T794XXA Traumatic shock, initial encounter: Secondary | ICD-10-CM | POA: Diagnosis not present

## 2018-04-17 DIAGNOSIS — J9601 Acute respiratory failure with hypoxia: Secondary | ICD-10-CM | POA: Diagnosis not present

## 2018-04-17 DIAGNOSIS — E119 Type 2 diabetes mellitus without complications: Secondary | ICD-10-CM | POA: Diagnosis not present

## 2018-04-18 DIAGNOSIS — E119 Type 2 diabetes mellitus without complications: Secondary | ICD-10-CM | POA: Diagnosis not present

## 2018-04-18 DIAGNOSIS — J9601 Acute respiratory failure with hypoxia: Secondary | ICD-10-CM | POA: Diagnosis not present

## 2018-04-18 DIAGNOSIS — T794XXA Traumatic shock, initial encounter: Secondary | ICD-10-CM | POA: Diagnosis not present

## 2018-04-18 DIAGNOSIS — A419 Sepsis, unspecified organism: Secondary | ICD-10-CM | POA: Diagnosis not present

## 2018-04-19 DIAGNOSIS — T794XXA Traumatic shock, initial encounter: Secondary | ICD-10-CM | POA: Diagnosis not present

## 2018-04-19 DIAGNOSIS — A419 Sepsis, unspecified organism: Secondary | ICD-10-CM | POA: Diagnosis not present

## 2018-04-19 DIAGNOSIS — E119 Type 2 diabetes mellitus without complications: Secondary | ICD-10-CM | POA: Diagnosis not present

## 2018-04-19 DIAGNOSIS — J9601 Acute respiratory failure with hypoxia: Secondary | ICD-10-CM | POA: Diagnosis not present

## 2018-04-20 DIAGNOSIS — J9601 Acute respiratory failure with hypoxia: Secondary | ICD-10-CM | POA: Diagnosis not present

## 2018-04-20 DIAGNOSIS — R008 Other abnormalities of heart beat: Secondary | ICD-10-CM | POA: Diagnosis not present

## 2018-04-20 DIAGNOSIS — A419 Sepsis, unspecified organism: Secondary | ICD-10-CM | POA: Diagnosis not present

## 2018-04-20 DIAGNOSIS — J181 Lobar pneumonia, unspecified organism: Secondary | ICD-10-CM | POA: Diagnosis not present

## 2018-04-20 DIAGNOSIS — T794XXA Traumatic shock, initial encounter: Secondary | ICD-10-CM | POA: Diagnosis not present

## 2018-04-20 DIAGNOSIS — E119 Type 2 diabetes mellitus without complications: Secondary | ICD-10-CM | POA: Diagnosis not present

## 2018-04-21 DIAGNOSIS — J9601 Acute respiratory failure with hypoxia: Secondary | ICD-10-CM | POA: Diagnosis not present

## 2018-04-21 DIAGNOSIS — R0603 Acute respiratory distress: Secondary | ICD-10-CM | POA: Diagnosis not present

## 2018-04-21 DIAGNOSIS — T794XXA Traumatic shock, initial encounter: Secondary | ICD-10-CM | POA: Diagnosis not present

## 2018-04-21 DIAGNOSIS — E119 Type 2 diabetes mellitus without complications: Secondary | ICD-10-CM | POA: Diagnosis not present

## 2018-04-21 DIAGNOSIS — A419 Sepsis, unspecified organism: Secondary | ICD-10-CM | POA: Diagnosis not present

## 2018-04-24 DIAGNOSIS — R000 Tachycardia, unspecified: Secondary | ICD-10-CM | POA: Diagnosis not present

## 2018-04-24 DIAGNOSIS — R0603 Acute respiratory distress: Secondary | ICD-10-CM | POA: Diagnosis not present

## 2018-04-24 DIAGNOSIS — Z9889 Other specified postprocedural states: Secondary | ICD-10-CM | POA: Diagnosis not present

## 2018-04-24 DIAGNOSIS — R109 Unspecified abdominal pain: Secondary | ICD-10-CM | POA: Diagnosis not present

## 2018-04-24 DIAGNOSIS — I471 Supraventricular tachycardia: Secondary | ICD-10-CM | POA: Diagnosis not present

## 2018-04-29 DIAGNOSIS — J9601 Acute respiratory failure with hypoxia: Secondary | ICD-10-CM | POA: Diagnosis not present

## 2018-04-29 DIAGNOSIS — S31130A Puncture wound of abdominal wall without foreign body, right upper quadrant without penetration into peritoneal cavity, initial encounter: Secondary | ICD-10-CM | POA: Diagnosis not present

## 2018-04-29 DIAGNOSIS — E119 Type 2 diabetes mellitus without complications: Secondary | ICD-10-CM | POA: Diagnosis not present

## 2018-05-02 DIAGNOSIS — Z09 Encounter for follow-up examination after completed treatment for conditions other than malignant neoplasm: Secondary | ICD-10-CM | POA: Diagnosis not present

## 2018-05-02 DIAGNOSIS — Z9889 Other specified postprocedural states: Secondary | ICD-10-CM | POA: Diagnosis not present

## 2018-05-02 DIAGNOSIS — Z5189 Encounter for other specified aftercare: Secondary | ICD-10-CM | POA: Diagnosis not present

## 2018-05-11 DIAGNOSIS — S31109A Unspecified open wound of abdominal wall, unspecified quadrant without penetration into peritoneal cavity, initial encounter: Secondary | ICD-10-CM | POA: Diagnosis not present

## 2018-05-12 DIAGNOSIS — Z9889 Other specified postprocedural states: Secondary | ICD-10-CM | POA: Diagnosis not present

## 2018-05-12 DIAGNOSIS — S31109A Unspecified open wound of abdominal wall, unspecified quadrant without penetration into peritoneal cavity, initial encounter: Secondary | ICD-10-CM | POA: Diagnosis not present

## 2018-05-15 DIAGNOSIS — Z5189 Encounter for other specified aftercare: Secondary | ICD-10-CM | POA: Diagnosis not present

## 2018-05-17 DIAGNOSIS — S31139D Puncture wound of abdominal wall without foreign body, unspecified quadrant without penetration into peritoneal cavity, subsequent encounter: Secondary | ICD-10-CM | POA: Diagnosis not present

## 2018-05-24 DIAGNOSIS — S31139A Puncture wound of abdominal wall without foreign body, unspecified quadrant without penetration into peritoneal cavity, initial encounter: Secondary | ICD-10-CM | POA: Diagnosis not present

## 2018-05-24 DIAGNOSIS — W3400XA Accidental discharge from unspecified firearms or gun, initial encounter: Secondary | ICD-10-CM | POA: Diagnosis not present

## 2018-05-31 DIAGNOSIS — Z23 Encounter for immunization: Secondary | ICD-10-CM | POA: Diagnosis not present

## 2018-06-14 DIAGNOSIS — W3400XA Accidental discharge from unspecified firearms or gun, initial encounter: Secondary | ICD-10-CM | POA: Diagnosis not present

## 2018-06-14 DIAGNOSIS — S31139A Puncture wound of abdominal wall without foreign body, unspecified quadrant without penetration into peritoneal cavity, initial encounter: Secondary | ICD-10-CM | POA: Diagnosis not present

## 2018-06-28 DIAGNOSIS — S31139D Puncture wound of abdominal wall without foreign body, unspecified quadrant without penetration into peritoneal cavity, subsequent encounter: Secondary | ICD-10-CM | POA: Diagnosis not present

## 2018-06-28 DIAGNOSIS — W3400XD Accidental discharge from unspecified firearms or gun, subsequent encounter: Secondary | ICD-10-CM | POA: Diagnosis not present

## 2018-07-05 DIAGNOSIS — S31139D Puncture wound of abdominal wall without foreign body, unspecified quadrant without penetration into peritoneal cavity, subsequent encounter: Secondary | ICD-10-CM | POA: Diagnosis not present

## 2018-07-05 DIAGNOSIS — W3400XD Accidental discharge from unspecified firearms or gun, subsequent encounter: Secondary | ICD-10-CM | POA: Diagnosis not present

## 2018-07-26 DIAGNOSIS — E1140 Type 2 diabetes mellitus with diabetic neuropathy, unspecified: Secondary | ICD-10-CM | POA: Diagnosis not present

## 2018-07-26 DIAGNOSIS — I10 Essential (primary) hypertension: Secondary | ICD-10-CM | POA: Diagnosis not present

## 2018-07-26 DIAGNOSIS — E782 Mixed hyperlipidemia: Secondary | ICD-10-CM | POA: Diagnosis not present

## 2018-07-26 DIAGNOSIS — E119 Type 2 diabetes mellitus without complications: Secondary | ICD-10-CM | POA: Diagnosis not present

## 2018-11-27 DIAGNOSIS — I10 Essential (primary) hypertension: Secondary | ICD-10-CM | POA: Diagnosis not present

## 2018-11-27 DIAGNOSIS — E119 Type 2 diabetes mellitus without complications: Secondary | ICD-10-CM | POA: Diagnosis not present

## 2018-11-27 DIAGNOSIS — E785 Hyperlipidemia, unspecified: Secondary | ICD-10-CM | POA: Diagnosis not present

## 2019-05-10 ENCOUNTER — Other Ambulatory Visit: Payer: Self-pay

## 2019-05-10 ENCOUNTER — Inpatient Hospital Stay (HOSPITAL_COMMUNITY)
Admission: EM | Admit: 2019-05-10 | Discharge: 2019-05-14 | DRG: 638 | Disposition: A | Discharge status: Home / Self Care | Payer: BLUE CROSS/BLUE SHIELD | Attending: Internal Medicine | Admitting: Internal Medicine

## 2019-05-10 ENCOUNTER — Encounter (HOSPITAL_COMMUNITY): Payer: Self-pay | Admitting: Emergency Medicine

## 2019-05-10 DIAGNOSIS — R17 Unspecified jaundice: Secondary | ICD-10-CM | POA: Diagnosis present

## 2019-05-10 DIAGNOSIS — Z20828 Contact with and (suspected) exposure to other viral communicable diseases: Secondary | ICD-10-CM | POA: Diagnosis present

## 2019-05-10 DIAGNOSIS — N179 Acute kidney failure, unspecified: Secondary | ICD-10-CM | POA: Diagnosis present

## 2019-05-10 DIAGNOSIS — D72829 Elevated white blood cell count, unspecified: Secondary | ICD-10-CM | POA: Diagnosis present

## 2019-05-10 DIAGNOSIS — F17210 Nicotine dependence, cigarettes, uncomplicated: Secondary | ICD-10-CM | POA: Diagnosis present

## 2019-05-10 DIAGNOSIS — E1151 Type 2 diabetes mellitus with diabetic peripheral angiopathy without gangrene: Secondary | ICD-10-CM | POA: Diagnosis present

## 2019-05-10 DIAGNOSIS — I10 Essential (primary) hypertension: Secondary | ICD-10-CM | POA: Diagnosis present

## 2019-05-10 DIAGNOSIS — E1110 Type 2 diabetes mellitus with ketoacidosis without coma: Principal | ICD-10-CM | POA: Diagnosis present

## 2019-05-10 DIAGNOSIS — Z7984 Long term (current) use of oral hypoglycemic drugs: Secondary | ICD-10-CM

## 2019-05-10 DIAGNOSIS — R112 Nausea with vomiting, unspecified: Secondary | ICD-10-CM | POA: Diagnosis present

## 2019-05-10 DIAGNOSIS — E1140 Type 2 diabetes mellitus with diabetic neuropathy, unspecified: Secondary | ICD-10-CM | POA: Diagnosis present

## 2019-05-10 DIAGNOSIS — R1310 Dysphagia, unspecified: Secondary | ICD-10-CM | POA: Diagnosis present

## 2019-05-10 DIAGNOSIS — E782 Mixed hyperlipidemia: Secondary | ICD-10-CM | POA: Diagnosis present

## 2019-05-10 DIAGNOSIS — E876 Hypokalemia: Secondary | ICD-10-CM | POA: Diagnosis present

## 2019-05-10 LAB — CBC
HCT: 49.4 % (ref 39.0–52.0)
Hemoglobin: 16.6 g/dL (ref 13.0–17.0)
MCH: 29.9 pg (ref 26.0–34.0)
MCHC: 33.6 g/dL (ref 30.0–36.0)
MCV: 89 fL (ref 80.0–100.0)
Platelets: 414 10*3/uL — ABNORMAL HIGH (ref 150–400)
RBC: 5.55 MIL/uL (ref 4.22–5.81)
RDW: 14.9 % (ref 11.5–15.5)
WBC: 16.3 10*3/uL — ABNORMAL HIGH (ref 4.0–10.5)
nRBC: 0 % (ref 0.0–0.2)

## 2019-05-10 LAB — POCT I-STAT EG7
Acid-base deficit: 10 mmol/L — ABNORMAL HIGH (ref 0.0–2.0)
Bicarbonate: 13.4 mmol/L — ABNORMAL LOW (ref 20.0–28.0)
Calcium, Ion: 1.01 mmol/L — ABNORMAL LOW (ref 1.15–1.40)
HCT: 51 % (ref 39.0–52.0)
Hemoglobin: 17.3 g/dL — ABNORMAL HIGH (ref 13.0–17.0)
O2 Saturation: 86 %
Potassium: 5.2 mmol/L — ABNORMAL HIGH (ref 3.5–5.1)
Sodium: 134 mmol/L — ABNORMAL LOW (ref 135–145)
TCO2: 14 mmol/L — ABNORMAL LOW (ref 22–32)
pCO2, Ven: 25.8 mmHg — ABNORMAL LOW (ref 44.0–60.0)
pH, Ven: 7.325 (ref 7.250–7.430)
pO2, Ven: 55 mmHg — ABNORMAL HIGH (ref 32.0–45.0)

## 2019-05-10 LAB — LIPASE, BLOOD: Lipase: 193 U/L — ABNORMAL HIGH (ref 11–51)

## 2019-05-10 LAB — COMPREHENSIVE METABOLIC PANEL
ALT: 16 U/L (ref 0–44)
AST: 13 U/L — ABNORMAL LOW (ref 15–41)
Albumin: 4 g/dL (ref 3.5–5.0)
Alkaline Phosphatase: 104 U/L (ref 38–126)
Anion gap: 38 — ABNORMAL HIGH (ref 5–15)
BUN: 76 mg/dL — ABNORMAL HIGH (ref 6–20)
CO2: 12 mmol/L — ABNORMAL LOW (ref 22–32)
Calcium: 9 mg/dL (ref 8.9–10.3)
Chloride: 86 mmol/L — ABNORMAL LOW (ref 98–111)
Creatinine, Ser: 3.7 mg/dL — ABNORMAL HIGH (ref 0.61–1.24)
GFR calc Af Amer: 20 mL/min — ABNORMAL LOW (ref >60)
GFR calc non Af Amer: 18 mL/min — ABNORMAL LOW (ref >60)
Glucose, Bld: 1076 mg/dL (ref 70–99)
Potassium: 4.8 mmol/L (ref 3.5–5.1)
Sodium: 136 mmol/L (ref 135–145)
Total Bilirubin: 2.8 mg/dL — ABNORMAL HIGH (ref 0.3–1.2)
Total Protein: 8 g/dL (ref 6.5–8.1)

## 2019-05-10 LAB — CBG MONITORING, ED
Glucose-Capillary: >600 mg/dL (ref 70–99)
Glucose-Capillary: >600 mg/dL (ref 70–99)

## 2019-05-10 MED ORDER — DEXTROSE-NACL 5-0.45 % IV SOLN
INTRAVENOUS | Status: DC
  Administered 2019-05-11 – 2019-05-12 (×3): via INTRAVENOUS

## 2019-05-10 MED ORDER — LACTATED RINGERS IV BOLUS
1000.0000 mL | Freq: Once | INTRAVENOUS | Status: AC
  Administered 2019-05-10: 1000 mL via INTRAVENOUS

## 2019-05-10 MED ORDER — SODIUM CHLORIDE 0.9% FLUSH: 3.0000 mL | Freq: Once | INTRAVENOUS | Status: DC

## 2019-05-10 MED ORDER — INSULIN REGULAR(HUMAN) IN NACL 100-0.9 UT/100ML-% IV SOLN
INTRAVENOUS | Status: DC
  Administered 2019-05-10: 5.4 [IU]/h via INTRAVENOUS
  Filled 2019-05-10: qty 100

## 2019-05-10 MED ORDER — LACTATED RINGERS IV BOLUS
1000.0000 mL | Freq: Once | INTRAVENOUS | Status: AC
  Administered 2019-05-11: 1000 mL via INTRAVENOUS
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
## 2019-05-10 NOTE — ED Triage Notes (Signed)
Pt reports vomiting and lower abd painx 3 days, denies fevers or diarrhea, denies any recent sick contacts.

## 2019-05-11 DIAGNOSIS — N179 Acute kidney failure, unspecified: Secondary | ICD-10-CM

## 2019-05-11 DIAGNOSIS — E1110 Type 2 diabetes mellitus with ketoacidosis without coma: Secondary | ICD-10-CM | POA: Diagnosis present

## 2019-05-11 LAB — BASIC METABOLIC PANEL
Anion gap: 29 — ABNORMAL HIGH (ref 5–15)
Anion gap: 22 — ABNORMAL HIGH (ref 5–15)
Anion gap: 15 (ref 5–15)
Anion gap: 15 (ref 5–15)
Anion gap: 14 (ref 5–15)
BUN: 78 mg/dL — ABNORMAL HIGH (ref 6–20)
BUN: 74 mg/dL — ABNORMAL HIGH (ref 6–20)
BUN: 73 mg/dL — ABNORMAL HIGH (ref 6–20)
BUN: 60 mg/dL — ABNORMAL HIGH (ref 6–20)
BUN: 55 mg/dL — ABNORMAL HIGH (ref 6–20)
CO2: 17 mmol/L — ABNORMAL LOW (ref 22–32)
CO2: 20 mmol/L — ABNORMAL LOW (ref 22–32)
CO2: 27 mmol/L (ref 22–32)
CO2: 25 mmol/L (ref 22–32)
CO2: 24 mmol/L (ref 22–32)
Calcium: 9 mg/dL (ref 8.9–10.3)
Calcium: 8.8 mg/dL — ABNORMAL LOW (ref 8.9–10.3)
Calcium: 9 mg/dL (ref 8.9–10.3)
Calcium: 8.7 mg/dL — ABNORMAL LOW (ref 8.9–10.3)
Calcium: 8.3 mg/dL — ABNORMAL LOW (ref 8.9–10.3)
Chloride: 95 mmol/L — ABNORMAL LOW (ref 98–111)
Chloride: 102 mmol/L (ref 98–111)
Chloride: 105 mmol/L (ref 98–111)
Chloride: 110 mmol/L (ref 98–111)
Chloride: 108 mmol/L (ref 98–111)
Creatinine, Ser: 3.35 mg/dL — ABNORMAL HIGH (ref 0.61–1.24)
Creatinine, Ser: 2.65 mg/dL — ABNORMAL HIGH (ref 0.61–1.24)
Creatinine, Ser: 2.37 mg/dL — ABNORMAL HIGH (ref 0.61–1.24)
Creatinine, Ser: 1.75 mg/dL — ABNORMAL HIGH (ref 0.61–1.24)
Creatinine, Ser: 1.71 mg/dL — ABNORMAL HIGH (ref 0.61–1.24)
GFR calc Af Amer: 23 mL/min — ABNORMAL LOW (ref >60)
GFR calc Af Amer: 31 mL/min — ABNORMAL LOW (ref >60)
GFR calc Af Amer: 35 mL/min — ABNORMAL LOW (ref >60)
GFR calc Af Amer: 50 mL/min — ABNORMAL LOW (ref >60)
GFR calc Af Amer: 52 mL/min — ABNORMAL LOW (ref >60)
GFR calc non Af Amer: 20 mL/min — ABNORMAL LOW (ref >60)
GFR calc non Af Amer: 26 mL/min — ABNORMAL LOW (ref >60)
GFR calc non Af Amer: 30 mL/min — ABNORMAL LOW (ref >60)
GFR calc non Af Amer: 43 mL/min — ABNORMAL LOW (ref >60)
GFR calc non Af Amer: 45 mL/min — ABNORMAL LOW (ref >60)
Glucose, Bld: 791 mg/dL (ref 70–99)
Glucose, Bld: 521 mg/dL (ref 70–99)
Glucose, Bld: 264 mg/dL — ABNORMAL HIGH (ref 70–99)
Glucose, Bld: 157 mg/dL — ABNORMAL HIGH (ref 70–99)
Glucose, Bld: 268 mg/dL — ABNORMAL HIGH (ref 70–99)
Potassium: 4.1 mmol/L (ref 3.5–5.1)
Potassium: 4 mmol/L (ref 3.5–5.1)
Potassium: 3.7 mmol/L (ref 3.5–5.1)
Potassium: 3.7 mmol/L (ref 3.5–5.1)
Potassium: 3.4 mmol/L — ABNORMAL LOW (ref 3.5–5.1)
Sodium: 141 mmol/L (ref 135–145)
Sodium: 144 mmol/L (ref 135–145)
Sodium: 147 mmol/L — ABNORMAL HIGH (ref 135–145)
Sodium: 150 mmol/L — ABNORMAL HIGH (ref 135–145)
Sodium: 146 mmol/L — ABNORMAL HIGH (ref 135–145)

## 2019-05-11 LAB — URINALYSIS, ROUTINE W REFLEX MICROSCOPIC
Bacteria, UA: NONE SEEN
Bilirubin Urine: NEGATIVE
Glucose, UA: >=500 mg/dL — AB
Ketones, ur: 80 mg/dL — AB
Leukocytes,Ua: NEGATIVE
Nitrite: NEGATIVE
Protein, ur: 30 mg/dL — AB
Specific Gravity, Urine: 1.021 (ref 1.005–1.030)
pH: 5 (ref 5.0–8.0)

## 2019-05-11 LAB — CBC
HCT: 48.7 % (ref 39.0–52.0)
Hemoglobin: 16.2 g/dL (ref 13.0–17.0)
MCH: 29 pg (ref 26.0–34.0)
MCHC: 33.3 g/dL (ref 30.0–36.0)
MCV: 87.3 fL (ref 80.0–100.0)
Platelets: 309 10*3/uL (ref 150–400)
RBC: 5.58 MIL/uL (ref 4.22–5.81)
RDW: 14.4 % (ref 11.5–15.5)
WBC: 12.2 10*3/uL — ABNORMAL HIGH (ref 4.0–10.5)
nRBC: 0 % (ref 0.0–0.2)

## 2019-05-11 LAB — CBG MONITORING, ED
Glucose-Capillary: 121 mg/dL — ABNORMAL HIGH (ref 70–99)
Glucose-Capillary: 127 mg/dL — ABNORMAL HIGH (ref 70–99)
Glucose-Capillary: 200 mg/dL — ABNORMAL HIGH (ref 70–99)
Glucose-Capillary: 201 mg/dL — ABNORMAL HIGH (ref 70–99)
Glucose-Capillary: 219 mg/dL — ABNORMAL HIGH (ref 70–99)
Glucose-Capillary: 292 mg/dL — ABNORMAL HIGH (ref 70–99)
Glucose-Capillary: 377 mg/dL — ABNORMAL HIGH (ref 70–99)
Glucose-Capillary: 426 mg/dL — ABNORMAL HIGH (ref 70–99)
Glucose-Capillary: 459 mg/dL — ABNORMAL HIGH (ref 70–99)
Glucose-Capillary: 500 mg/dL — ABNORMAL HIGH (ref 70–99)
Glucose-Capillary: >600 mg/dL (ref 70–99)
Glucose-Capillary: >600 mg/dL (ref 70–99)

## 2019-05-11 LAB — HIV ANTIBODY (ROUTINE TESTING W REFLEX): HIV Screen 4th Generation wRfx: NONREACTIVE

## 2019-05-11 LAB — GLUCOSE, CAPILLARY
Glucose-Capillary: 152 mg/dL — ABNORMAL HIGH (ref 70–99)
Glucose-Capillary: 178 mg/dL — ABNORMAL HIGH (ref 70–99)
Glucose-Capillary: 191 mg/dL — ABNORMAL HIGH (ref 70–99)
Glucose-Capillary: 195 mg/dL — ABNORMAL HIGH (ref 70–99)
Glucose-Capillary: 373 mg/dL — ABNORMAL HIGH (ref 70–99)

## 2019-05-11 LAB — BRAIN NATRIURETIC PEPTIDE: B Natriuretic Peptide: 39.1 pg/mL (ref 0.0–100.0)

## 2019-05-11 LAB — HEMOGLOBIN A1C
Hgb A1c MFr Bld: 12.1 % — ABNORMAL HIGH (ref 4.8–5.6)
Mean Plasma Glucose: 300.57 mg/dL

## 2019-05-11 MED ORDER — ONDANSETRON HCL 4 MG/2ML IJ SOLN
4.0000 mg | Freq: Four times a day (QID) | INTRAMUSCULAR | Status: DC | PRN
  Administered 2019-05-11 – 2019-05-12 (×3): 4 mg via INTRAVENOUS
  Filled 2019-05-11 (×4): qty 2

## 2019-05-11 MED ORDER — ENOXAPARIN SODIUM 40 MG/0.4ML ~~LOC~~ SOLN
40.0000 mg | SUBCUTANEOUS | Status: DC
  Administered 2019-05-11 – 2019-05-14 (×4): 40 mg via SUBCUTANEOUS
  Filled 2019-05-11 (×4): qty 0.4

## 2019-05-11 MED ORDER — ACETAMINOPHEN 325 MG PO TABS
650.0000 mg | ORAL_TABLET | Freq: Four times a day (QID) | ORAL | Status: DC | PRN
  Administered 2019-05-13 – 2019-05-14 (×3): 650 mg via ORAL
  Filled 2019-05-11 (×3): qty 2

## 2019-05-11 MED ORDER — MORPHINE SULFATE (PF) 2 MG/ML IV SOLN
2.0000 mg | INTRAVENOUS | Status: DC | PRN
  Filled 2019-05-11: qty 1

## 2019-05-11 MED ORDER — METOCLOPRAMIDE HCL 5 MG/ML IJ SOLN
10.0000 mg | Freq: Four times a day (QID) | INTRAMUSCULAR | Status: DC | PRN
  Administered 2019-05-11 – 2019-05-12 (×2): 10 mg via INTRAVENOUS
  Filled 2019-05-11 (×2): qty 2

## 2019-05-11 MED ORDER — SODIUM CHLORIDE 0.9 % IV SOLN: INTRAVENOUS | Status: DC

## 2019-05-11 MED ORDER — SODIUM CHLORIDE 0.9 % IV SOLN
INTRAVENOUS | Status: DC
  Administered 2019-05-11: 04:00:00 via INTRAVENOUS

## 2019-05-11 MED ORDER — INSULIN STARTER KIT- SYRINGES (SPANISH)
1.0000 | Freq: Once | Status: DC
  Filled 2019-05-11: qty 1

## 2019-05-11 MED ORDER — TRAMADOL HCL 50 MG PO TABS: 50.0000 mg | ORAL_TABLET | Freq: Four times a day (QID) | ORAL | Status: DC | PRN

## 2019-05-11 MED ORDER — LIVING WELL WITH DIABETES BOOK
Freq: Once | Status: DC
  Filled 2019-05-11: qty 1

## 2019-05-11 MED ORDER — DEXTROSE-NACL 5-0.45 % IV SOLN: INTRAVENOUS | Status: DC

## 2019-05-11 MED ORDER — INSULIN REGULAR(HUMAN) IN NACL 100-0.9 UT/100ML-% IV SOLN
INTRAVENOUS | Status: DC
  Administered 2019-05-11: 7.3 [IU]/h via INTRAVENOUS
  Administered 2019-05-11: 12.7 [IU]/h via INTRAVENOUS
  Administered 2019-05-12: 4.6 [IU]/h via INTRAVENOUS
  Filled 2019-05-11 (×2): qty 100
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
## 2019-05-11 NOTE — ED Notes (Signed)
Insulin drip rate changed. Witnessed by Mel Almond, RN

## 2019-05-11 NOTE — Progress Notes (Signed)
Patient ID: Andres Richardson, male   DOB: 28-Mar-1966, 53 y.o.   MRN: [id number] Patient was admitted early this morning for DKA and started on insulin drip and IV fluids.  Patient seen and examined at bedside.  Plan of care discussed with him.  I have reviewed patient's medical records including this morning's H&P myself.  Patient is still nauseous and intermittently vomiting.  Will not advance diet yet.  Anion gap in the latest BMP is still 15.  Continue insulin drip and IV fluids and follow repeat BMP.

## 2019-05-11 NOTE — Progress Notes (Addendum)
Inpatient Diabetes Program Recommendations  AACE/ADA: New Consensus Statement on Inpatient Glycemic Control (2015)  Target Ranges:  Prepandial:   less than 140 mg/dL      Peak postprandial:   less than 180 mg/dL (1-2 hours)      Critically ill patients:  140 - 180 mg/dL   Lab Results  Component Value Date   GLUCAP 201 (H) 05/11/2019    Review of Glycemic Control Results for EYDAN, CHIANESE (MRN [id number]) as of 05/11/2019 11:38  Ref. Range 05/11/2019 08:43 05/11/2019 09:47 05/11/2019 10:52  Glucose-Capillary Latest Ref Range: 70 - 99 mg/dL 219 (H) 200 (H) 201 (H)   Diabetes history: DM 2 Outpatient Diabetes medications: Metformin 750 mg bid, Glucotrol 5 mg daily Current orders for Inpatient glycemic control:  DKA order set/IV insulin  Inpatient Diabetes Program Recommendations:    Patient admitted with glucose>600 mg/dL.   He is only on oral agents at home for DM. Still on insulin drip and BMP's being checked q 4 hours.  Patient will likely  Need insulin at d/c.   -Please add A1C to labs. Consider weight based basal insulin once patient is ready for transition.   -Consider Levemir 10 units bid upon transition off insulin drip (once acidosis cleared).   Thanks  Adah Perl, RN, BC-ADM Inpatient Diabetes Coordinator Pager [phone] (8a-5p)  Addendum [phone]- Spoke with RN regarding patient care. She states that patient is not feeling well at this time and is vommitting.  Will f/u on 9/26 regarding A1C and potential need for insulin.
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
## 2019-05-12 DIAGNOSIS — D72829 Elevated white blood cell count, unspecified: Secondary | ICD-10-CM

## 2019-05-12 LAB — GLUCOSE, CAPILLARY
Glucose-Capillary: 261 mg/dL — ABNORMAL HIGH (ref 70–99)
Glucose-Capillary: 136 mg/dL — ABNORMAL HIGH (ref 70–99)
Glucose-Capillary: 181 mg/dL — ABNORMAL HIGH (ref 70–99)
Glucose-Capillary: 178 mg/dL — ABNORMAL HIGH (ref 70–99)
Glucose-Capillary: 168 mg/dL — ABNORMAL HIGH (ref 70–99)
Glucose-Capillary: 175 mg/dL — ABNORMAL HIGH (ref 70–99)
Glucose-Capillary: 149 mg/dL — ABNORMAL HIGH (ref 70–99)
Glucose-Capillary: 125 mg/dL — ABNORMAL HIGH (ref 70–99)
Glucose-Capillary: 147 mg/dL — ABNORMAL HIGH (ref 70–99)
Glucose-Capillary: 241 mg/dL — ABNORMAL HIGH (ref 70–99)
Glucose-Capillary: 264 mg/dL — ABNORMAL HIGH (ref 70–99)
Glucose-Capillary: 256 mg/dL — ABNORMAL HIGH (ref 70–99)
Glucose-Capillary: 254 mg/dL — ABNORMAL HIGH (ref 70–99)

## 2019-05-12 LAB — BASIC METABOLIC PANEL
Anion gap: 12 (ref 5–15)
Anion gap: 8 (ref 5–15)
BUN: 35 mg/dL — ABNORMAL HIGH (ref 6–20)
BUN: 44 mg/dL — ABNORMAL HIGH (ref 6–20)
CO2: 26 mmol/L (ref 22–32)
CO2: 29 mmol/L (ref 22–32)
Calcium: 8.2 mg/dL — ABNORMAL LOW (ref 8.9–10.3)
Calcium: 8.4 mg/dL — ABNORMAL LOW (ref 8.9–10.3)
Chloride: 109 mmol/L (ref 98–111)
Chloride: 111 mmol/L (ref 98–111)
Creatinine, Ser: 1.24 mg/dL (ref 0.61–1.24)
Creatinine, Ser: 1.48 mg/dL — ABNORMAL HIGH (ref 0.61–1.24)
GFR calc Af Amer: >60 mL/min (ref >60)
GFR calc Af Amer: >60 mL/min (ref >60)
GFR calc non Af Amer: 53 mL/min — ABNORMAL LOW (ref >60)
GFR calc non Af Amer: >60 mL/min (ref >60)
Glucose, Bld: 139 mg/dL — ABNORMAL HIGH (ref 70–99)
Glucose, Bld: 152 mg/dL — ABNORMAL HIGH (ref 70–99)
Potassium: 3.1 mmol/L — ABNORMAL LOW (ref 3.5–5.1)
Potassium: 3.2 mmol/L — ABNORMAL LOW (ref 3.5–5.1)
Sodium: 147 mmol/L — ABNORMAL HIGH (ref 135–145)
Sodium: 148 mmol/L — ABNORMAL HIGH (ref 135–145)

## 2019-05-12 LAB — CBC WITH DIFFERENTIAL/PLATELET
Abs Immature Granulocytes: 0.03 10*3/uL (ref 0.00–0.07)
Basophils Absolute: 0 10*3/uL (ref 0.0–0.1)
Basophils Relative: 0 %
Eosinophils Absolute: 0 10*3/uL (ref 0.0–0.5)
Eosinophils Relative: 0 %
HCT: 42.3 % (ref 39.0–52.0)
Hemoglobin: 14.4 g/dL (ref 13.0–17.0)
Immature Granulocytes: 0 %
Lymphocytes Relative: 13 %
Lymphs Abs: 1.2 10*3/uL (ref 0.7–4.0)
MCH: 29.8 pg (ref 26.0–34.0)
MCHC: 34 g/dL (ref 30.0–36.0)
MCV: 87.6 fL (ref 80.0–100.0)
Monocytes Absolute: 0.9 10*3/uL (ref 0.1–1.0)
Monocytes Relative: 11 %
Neutro Abs: 6.8 10*3/uL (ref 1.7–7.7)
Neutrophils Relative %: 76 %
Platelets: 253 10*3/uL (ref 150–400)
RBC: 4.83 MIL/uL (ref 4.22–5.81)
RDW: 14.5 % (ref 11.5–15.5)
WBC: 9 10*3/uL (ref 4.0–10.5)
nRBC: 0 % (ref 0.0–0.2)

## 2019-05-12 LAB — COMPREHENSIVE METABOLIC PANEL
ALT: 11 U/L (ref 0–44)
AST: 12 U/L — ABNORMAL LOW (ref 15–41)
Albumin: 3 g/dL — ABNORMAL LOW (ref 3.5–5.0)
Alkaline Phosphatase: 72 U/L (ref 38–126)
Anion gap: 11 (ref 5–15)
BUN: 40 mg/dL — ABNORMAL HIGH (ref 6–20)
CO2: 26 mmol/L (ref 22–32)
Calcium: 8.2 mg/dL — ABNORMAL LOW (ref 8.9–10.3)
Chloride: 110 mmol/L (ref 98–111)
Creatinine, Ser: 1.3 mg/dL — ABNORMAL HIGH (ref 0.61–1.24)
GFR calc Af Amer: >60 mL/min (ref >60)
GFR calc non Af Amer: >60 mL/min (ref >60)
Glucose, Bld: 191 mg/dL — ABNORMAL HIGH (ref 70–99)
Potassium: 3.2 mmol/L — ABNORMAL LOW (ref 3.5–5.1)
Sodium: 147 mmol/L — ABNORMAL HIGH (ref 135–145)
Total Bilirubin: 0.3 mg/dL (ref 0.3–1.2)
Total Protein: 5.9 g/dL — ABNORMAL LOW (ref 6.5–8.1)

## 2019-05-12 LAB — MAGNESIUM: Magnesium: 2.3 mg/dL (ref 1.7–2.4)

## 2019-05-12 MED ORDER — INSULIN DETEMIR 100 UNIT/ML ~~LOC~~ SOLN
10.0000 [IU] | Freq: Two times a day (BID) | SUBCUTANEOUS | Status: DC
  Administered 2019-05-12 (×2): 10 [IU] via SUBCUTANEOUS
  Filled 2019-05-12 (×4): qty 0.1

## 2019-05-12 MED ORDER — POTASSIUM CHLORIDE 10 MEQ/100ML IV SOLN
10.0000 meq | INTRAVENOUS | Status: AC
  Administered 2019-05-12 (×4): 10 meq via INTRAVENOUS
  Filled 2019-05-12 (×3): qty 100

## 2019-05-12 MED ORDER — SODIUM CHLORIDE 0.45 % IV SOLN
INTRAVENOUS | Status: DC
  Administered 2019-05-12 – 2019-05-13 (×3): via INTRAVENOUS

## 2019-05-12 MED ORDER — PANTOPRAZOLE SODIUM 40 MG IV SOLR
40.0000 mg | INTRAVENOUS | Status: DC
  Administered 2019-05-12 – 2019-05-14 (×3): 40 mg via INTRAVENOUS
  Filled 2019-05-12 (×4): qty 40

## 2019-05-12 MED ORDER — INSULIN ASPART 100 UNIT/ML ~~LOC~~ SOLN
0.0000 [IU] | Freq: Three times a day (TID) | SUBCUTANEOUS | Status: DC
  Administered 2019-05-12: 5 [IU] via SUBCUTANEOUS
  Administered 2019-05-12: 8 [IU] via SUBCUTANEOUS
  Administered 2019-05-13: 19:00:00 2 [IU] via SUBCUTANEOUS
  Administered 2019-05-13: 5 [IU] via SUBCUTANEOUS

## 2019-05-12 MED ORDER — ONDANSETRON HCL 4 MG/2ML IJ SOLN
4.0000 mg | Freq: Four times a day (QID) | INTRAMUSCULAR | Status: DC
  Administered 2019-05-12 – 2019-05-13 (×6): 4 mg via INTRAVENOUS
  Filled 2019-05-12 (×6): qty 2

## 2019-05-12 MED ORDER — INSULIN ASPART 100 UNIT/ML ~~LOC~~ SOLN
0.0000 [IU] | Freq: Every day | SUBCUTANEOUS | Status: DC
  Administered 2019-05-12: 3 [IU] via SUBCUTANEOUS
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
## 2019-05-12 NOTE — ED Notes (Signed)
Withdrew 2 mg morphine medication for pt, pt stated he was not in pain so morphine was not given. Was unable to return morphine to [facility] due to the pt being off the floor. Pharmacy was contacted, was asked if we should return the medication or waste. Was directed to waste.   Wasted 2mg  morphine with patty perez RN.

## 2019-05-13 DIAGNOSIS — R1310 Dysphagia, unspecified: Secondary | ICD-10-CM

## 2019-05-13 LAB — NOVEL CORONAVIRUS, NAA (HOSP ORDER, SEND-OUT TO REF LAB; TAT 18-24 HRS): SARS-CoV-2, NAA: NOT DETECTED

## 2019-05-13 LAB — GLUCOSE, CAPILLARY
Glucose-Capillary: 234 mg/dL — ABNORMAL HIGH (ref 70–99)
Glucose-Capillary: 423 mg/dL — ABNORMAL HIGH (ref 70–99)
Glucose-Capillary: 145 mg/dL — ABNORMAL HIGH (ref 70–99)
Glucose-Capillary: 156 mg/dL — ABNORMAL HIGH (ref 70–99)

## 2019-05-13 LAB — BASIC METABOLIC PANEL
Anion gap: 8 (ref 5–15)
BUN: 18 mg/dL (ref 6–20)
CO2: 27 mmol/L (ref 22–32)
Calcium: 7.7 mg/dL — ABNORMAL LOW (ref 8.9–10.3)
Chloride: 107 mmol/L (ref 98–111)
Creatinine, Ser: 0.91 mg/dL (ref 0.61–1.24)
GFR calc Af Amer: >60 mL/min (ref >60)
GFR calc non Af Amer: >60 mL/min (ref >60)
Glucose, Bld: 307 mg/dL — ABNORMAL HIGH (ref 70–99)
Potassium: 3.4 mmol/L — ABNORMAL LOW (ref 3.5–5.1)
Sodium: 142 mmol/L (ref 135–145)

## 2019-05-13 LAB — MAGNESIUM: Magnesium: 2 mg/dL (ref 1.7–2.4)

## 2019-05-13 LAB — CBC WITH DIFFERENTIAL/PLATELET
Abs Immature Granulocytes: 0.02 10*3/uL (ref 0.00–0.07)
Basophils Absolute: 0 10*3/uL (ref 0.0–0.1)
Basophils Relative: 0 %
Eosinophils Absolute: 0 10*3/uL (ref 0.0–0.5)
Eosinophils Relative: 1 %
HCT: 37.2 % — ABNORMAL LOW (ref 39.0–52.0)
Hemoglobin: 12.5 g/dL — ABNORMAL LOW (ref 13.0–17.0)
Immature Granulocytes: 0 %
Lymphocytes Relative: 22 %
Lymphs Abs: 1.3 10*3/uL (ref 0.7–4.0)
MCH: 30.3 pg (ref 26.0–34.0)
MCHC: 33.6 g/dL (ref 30.0–36.0)
MCV: 90.3 fL (ref 80.0–100.0)
Monocytes Absolute: 0.7 10*3/uL (ref 0.1–1.0)
Monocytes Relative: 11 %
Neutro Abs: 4 10*3/uL (ref 1.7–7.7)
Neutrophils Relative %: 66 %
Platelets: 171 10*3/uL (ref 150–400)
RBC: 4.12 MIL/uL — ABNORMAL LOW (ref 4.22–5.81)
RDW: 14 % (ref 11.5–15.5)
WBC: 6.1 10*3/uL (ref 4.0–10.5)
nRBC: 0 % (ref 0.0–0.2)

## 2019-05-13 MED ORDER — INSULIN DETEMIR 100 UNIT/ML ~~LOC~~ SOLN
15.0000 [IU] | Freq: Two times a day (BID) | SUBCUTANEOUS | Status: DC
  Administered 2019-05-13 – 2019-05-14 (×3): 15 [IU] via SUBCUTANEOUS
  Filled 2019-05-13 (×4): qty 0.15

## 2019-05-13 MED ORDER — ONDANSETRON HCL 4 MG/2ML IJ SOLN: 4.0000 mg | Freq: Four times a day (QID) | INTRAMUSCULAR | Status: DC | PRN

## 2019-05-13 MED ORDER — LIP MEDEX EX OINT
TOPICAL_OINTMENT | CUTANEOUS | Status: DC | PRN
  Filled 2019-05-13: qty 7

## 2019-05-13 MED ORDER — PHENOL 1.4 % MT LIQD
1.0000 | OROMUCOSAL | Status: DC | PRN
  Administered 2019-05-13: 1 via OROMUCOSAL
  Filled 2019-05-13: qty 177

## 2019-05-13 MED ORDER — ORAL CARE MOUTH RINSE: 15.0000 mL | Freq: Two times a day (BID) | OROMUCOSAL | Status: DC

## 2019-05-13 MED ORDER — POTASSIUM CHLORIDE CRYS ER 20 MEQ PO TBCR
40.0000 meq | EXTENDED_RELEASE_TABLET | Freq: Once | ORAL | Status: AC
  Administered 2019-05-13: 40 meq via ORAL
  Filled 2019-05-13: qty 2

## 2019-05-13 MED ORDER — INSULIN ASPART 100 UNIT/ML ~~LOC~~ SOLN
20.0000 [IU] | Freq: Once | SUBCUTANEOUS | Status: AC
  Administered 2019-05-13: 20 [IU] via SUBCUTANEOUS
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
## 2019-05-13 NOTE — Progress Notes (Signed)
BG 423. Patient has consumed multiple fruit juice containers including apple and grape. Patient has also consumed applesauce and chicken broth. Dr. Starla Link notified via page.

## 2019-05-13 NOTE — Progress Notes (Signed)
Inpatient Diabetes Program Recommendations  AACE/ADA: New Consensus Statement on Inpatient Glycemic Control (2015)  Target Ranges:  Prepandial:   less than 140 mg/dL      Peak postprandial:   less than 180 mg/dL (1-2 hours)      Critically ill patients:  140 - 180 mg/dL   Lab Results  Component Value Date   GLUCAP 234 (H) 05/13/2019   HGBA1C 12.1 (H) 05/11/2019    Review of Glycemic Control  Blood sugars 307, 234 this am.  Inpatient Diabetes Program Recommendations:     Increase Levemir to 12 units bid.   Spoke with pt at length regarding HgbA1C of 12.1% and importance of getting his blood sugars in control and aim for 7% HgbA1C. Pt has given himself insulin injections and RN to also teach insulin pen administration. Discussed giving insulin in abdomen and rotating sites. Has PCP (Dr Justin Mend at [facility]) and will call tomorrow to set up appt for f/u from hospitalization within a week. Discussed monitoring blood sugars 3-4x/day and recording in logbook. Instructed to take logbook, meter to MD appt. Discussed taking both basal and bolus insulin for the best control. Discussed diet, exercise and pt states he is still only taking liquids at this point. States throat is really sore. Discussed hypoglycemia s/s and treatment. Pt voices understanding.  Will have Diabetes Coordinator to f/u in am for any further questions. Pt seems to be motivated to be on top of his diabetes.  Thank you. Lorenda Peck, RD, LDN, CDE Inpatient Diabetes Coordinator [phone]

## 2019-05-13 NOTE — Progress Notes (Signed)
   05/13/19 2021  MEWS Score  Resp 18  Pulse Rate 81  BP 124/60  Temp 98.2 F (36.8 C)  SpO2 99 %  O2 Device Nasal Cannula  O2 Flow Rate (L/min) 2 L/min  MEWS Score  MEWS RR 0  MEWS Pulse 0  MEWS Systolic 0  MEWS LOC 0  MEWS Temp 0  MEWS Score 0  MEWS Score Color Green  MEWS Assessment  Is this an acute change? No  MEWS Guidelines - (patients age 53 and over)  Red - At High Risk for Deterioration Yellow - At risk for Deterioration  1. Go to room and assess patient 2. Validate data. Is this patient's baseline? If data confirmed: 3. Is this an acute change? 4. Administer prn meds/treatments as ordered. 5. Note Sepsis score 6. Review goals of care 7. Sports coach, RRT nurse and Provider. 8. Ask Provider to come to bedside.  9. Document patient condition/interventions/response. 10. Increase frequency of vital signs and focused assessments to at least q15 minutes x 4, then q30 minutes x2. - If stable, then q1h x3, then q4h x3 and then q8h or dept. routine. - If unstable, contact Provider & RRT nurse. Prepare for possible transfer. 11. Add entry in progress notes using the smart phrase ".MEWS". 1. Go to room and assess patient 2. Validate data. Is this patient's baseline? If data confirmed: 3. Is this an acute change? 4. Administer prn meds/treatments as ordered? 5. Note Sepsis score 6. Review goals of care 7. Sports coach and Provider 8. Call RRT nurse as needed. 9. Document patient condition/interventions/response. 10. Increase frequency of vital signs and focused assessments to at least q2h x2. - If stable, then q4h x2 and then q8h or dept. routine. - If unstable, contact Provider & RRT nurse. Prepare for possible transfer. 11. Add entry in progress notes using the smart phrase ".MEWS".  Green - Likely stable Lavender - Comfort Care Only  1. Continue routine/ordered monitoring.  2. Review goals of care. 1. Continue routine/ordered monitoring. 2.  Review goals of care.

## 2019-05-13 NOTE — Plan of Care (Signed)
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
## 2019-05-14 LAB — BASIC METABOLIC PANEL
Anion gap: 6 (ref 5–15)
BUN: 10 mg/dL (ref 6–20)
CO2: 28 mmol/L (ref 22–32)
Calcium: 7.8 mg/dL — ABNORMAL LOW (ref 8.9–10.3)
Chloride: 108 mmol/L (ref 98–111)
Creatinine, Ser: 0.9 mg/dL (ref 0.61–1.24)
GFR calc Af Amer: >60 mL/min (ref >60)
GFR calc non Af Amer: >60 mL/min (ref >60)
Glucose, Bld: 162 mg/dL — ABNORMAL HIGH (ref 70–99)
Potassium: 3.9 mmol/L (ref 3.5–5.1)
Sodium: 142 mmol/L (ref 135–145)

## 2019-05-14 LAB — MAGNESIUM: Magnesium: 1.9 mg/dL (ref 1.7–2.4)

## 2019-05-14 LAB — GLUCOSE, CAPILLARY: Glucose-Capillary: 119 mg/dL — ABNORMAL HIGH (ref 70–99)

## 2019-05-14 MED ORDER — PHENOL 1.4 % MT LIQD: 1.0000 | OROMUCOSAL | 0 refills | Status: DC | PRN

## 2019-05-14 MED ORDER — PANTOPRAZOLE SODIUM 40 MG PO TBEC: 40.0000 mg | DELAYED_RELEASE_TABLET | Freq: Every day | ORAL | 0 refills | Status: DC

## 2019-05-14 MED ORDER — INSULIN DETEMIR 100 UNIT/ML ~~LOC~~ SOLN: 15.0000 [IU] | Freq: Two times a day (BID) | SUBCUTANEOUS | 0 refills | Status: DC

## 2019-05-14 MED ORDER — "INSULIN SYRINGE 31G X 5/16"" 0.3 ML MISC": 0 refills | Status: DC

## 2019-05-14 MED ORDER — ONDANSETRON HCL 4 MG PO TABS: 4.0000 mg | ORAL_TABLET | Freq: Four times a day (QID) | ORAL | 0 refills | Status: DC | PRN

## 2019-05-14 NOTE — Progress Notes (Signed)
PT Cancellation Note  Patient Details Name: Andres Richardson MRN: [id number] DOB: 08-18-1965   Cancelled Treatment:    Reason Eval/Treat Not Completed: PT screened, no needs identified, will sign off patient very pleasant but surprised to see therapy this morning, reports he is moving great and actually just finished taking a shower, reports no skilled PT needs. PT signing off, thank you for the referral.    Deniece Ree PT, DPT, CBIS  Supplemental Physical Therapist [facility]    Pager [phone] Acute Rehab Office [phone]

## 2019-05-14 NOTE — Progress Notes (Signed)
Nsg Discharge Note  Admit Date:  05/10/2019 Discharge date: 05/14/2019   Allyson Sabal to be D/C'd Home per MD order.  AVS completed.  Copy for chart, and copy for patient signed, and dated. Patient/caregiver able to verbalize understanding.  Discharge Medication: Allergies as of 05/14/2019      Reactions   Adhesive [tape] Rash      Medication List    STOP taking these medications   glipiZIDE 5 MG tablet Commonly known as: GLUCOTROL   metFORMIN 750 MG 24 hr tablet Commonly known as: GLUCOPHAGE-XR     TAKE these medications   insulin detemir 100 UNIT/ML injection Commonly known as: LEVEMIR Inject 0.15 mLs (15 Units total) into the skin 2 (two) times daily.   INSULIN SYRINGE .3CC/31GX5/16" 31G X 5/16" 0.3 ML Misc Levemir 15 units twice a day   ondansetron 4 MG tablet Commonly known as: Zofran Take 1 tablet (4 mg total) by mouth every 6 (six) hours as needed for nausea or vomiting.   pantoprazole 40 MG tablet Commonly known as: Protonix Take 1 tablet (40 mg total) by mouth daily.   phenol 1.4 % Liqd Commonly known as: CHLORASEPTIC Use as directed 1 spray in the mouth or throat as needed for throat irritation / pain.       Discharge Assessment: Vitals:   05/14/19 0407 05/14/19 0757  BP: 120/68 124/67  Pulse: 80 75  Resp: 18 17  Temp: 98.5 F (36.9 C) 98.5 F (36.9 C)  SpO2: 100% 100%   Skin clean, dry and intact without evidence of skin break down, no evidence of skin tears noted. IV catheter discontinued intact. Site without signs and symptoms of complications - no redness or edema noted at insertion site, patient denies c/o pain - only slight tenderness at site.  Dressing with slight pressure applied.  D/c Instructions-Education: Discharge instructions given to patient/family with verbalized understanding. D/c education completed with patient/family including follow up instructions, medication list, d/c activities limitations if indicated, with other d/c  instructions as indicated by MD - patient able to verbalize understanding, all questions fully answered. Patient instructed to return to ED, call 911, or call MD for any changes in condition.  Patient escorted via [place], and D/C home via private auto.  Walker Shadow, RN 05/14/2019 10:33 AM
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
## 2019-05-14 NOTE — Progress Notes (Signed)
Inpatient Diabetes Program Recommendations  AACE/ADA: New Consensus Statement on Inpatient Glycemic Control (2015)  Target Ranges:  Prepandial:   less than 140 mg/dL      Peak postprandial:   less than 180 mg/dL (1-2 hours)      Critically ill patients:  140 - 180 mg/dL   Lab Results  Component Value Date   GLUCAP 119 (H) 05/14/2019   HGBA1C 12.1 (H) 05/11/2019    Review of Glycemic Control  Diabetes history: DM 2  Inpatient Diabetes Program Recommendations:    Spoke with patient regarding DM. Pt has lost weight in the past for DM control. Discussed at length lifestyle modifications especially diet and exercise. Discussed at length the type of insulin, when to take and how to take. Discussed hypoglycemia. Pt had no further questions.  Thanks,  Tama Headings RN, MSN, BC-ADM Inpatient Diabetes Coordinator Team Pager [phone] (8a-5p)

## 2019-06-06 ENCOUNTER — Inpatient Hospital Stay: Payer: Self-pay | Admitting: Family Medicine

## 2020-08-16 DIAGNOSIS — N493 Fournier gangrene: Secondary | ICD-10-CM

## 2020-08-16 DIAGNOSIS — I2510 Atherosclerotic heart disease of native coronary artery without angina pectoris: Secondary | ICD-10-CM

## 2020-08-16 HISTORY — DX: Fournier gangrene: N49.3

## 2020-08-16 HISTORY — DX: Atherosclerotic heart disease of native coronary artery without angina pectoris: I25.10

## 2021-02-22 ENCOUNTER — Inpatient Hospital Stay (HOSPITAL_COMMUNITY)
Admission: EM | Admit: 2021-02-22 | Discharge: 2021-03-10 | DRG: 717 | Disposition: A | Discharge status: Home Health | Payer: Self-pay | Attending: Student | Admitting: Student

## 2021-02-22 ENCOUNTER — Other Ambulatory Visit: Payer: Self-pay

## 2021-02-22 ENCOUNTER — Encounter (HOSPITAL_COMMUNITY): Payer: Self-pay | Admitting: Emergency Medicine

## 2021-02-22 DIAGNOSIS — E1110 Type 2 diabetes mellitus with ketoacidosis without coma: Secondary | ICD-10-CM | POA: Diagnosis present

## 2021-02-22 DIAGNOSIS — Z20822 Contact with and (suspected) exposure to covid-19: Secondary | ICD-10-CM | POA: Diagnosis present

## 2021-02-22 DIAGNOSIS — Z66 Do not resuscitate: Secondary | ICD-10-CM | POA: Diagnosis present

## 2021-02-22 DIAGNOSIS — E876 Hypokalemia: Secondary | ICD-10-CM | POA: Diagnosis present

## 2021-02-22 DIAGNOSIS — Z7984 Long term (current) use of oral hypoglycemic drugs: Secondary | ICD-10-CM

## 2021-02-22 DIAGNOSIS — Z794 Long term (current) use of insulin: Secondary | ICD-10-CM

## 2021-02-22 DIAGNOSIS — E1151 Type 2 diabetes mellitus with diabetic peripheral angiopathy without gangrene: Secondary | ICD-10-CM | POA: Diagnosis present

## 2021-02-22 DIAGNOSIS — E1140 Type 2 diabetes mellitus with diabetic neuropathy, unspecified: Secondary | ICD-10-CM | POA: Diagnosis present

## 2021-02-22 DIAGNOSIS — F17210 Nicotine dependence, cigarettes, uncomplicated: Secondary | ICD-10-CM | POA: Diagnosis present

## 2021-02-22 DIAGNOSIS — K611 Rectal abscess: Secondary | ICD-10-CM | POA: Diagnosis present

## 2021-02-22 DIAGNOSIS — I10 Essential (primary) hypertension: Secondary | ICD-10-CM | POA: Diagnosis present

## 2021-02-22 DIAGNOSIS — E870 Hyperosmolality and hypernatremia: Secondary | ICD-10-CM | POA: Diagnosis present

## 2021-02-22 DIAGNOSIS — E669 Obesity, unspecified: Secondary | ICD-10-CM | POA: Diagnosis present

## 2021-02-22 DIAGNOSIS — E782 Mixed hyperlipidemia: Secondary | ICD-10-CM | POA: Diagnosis present

## 2021-02-22 DIAGNOSIS — N179 Acute kidney failure, unspecified: Secondary | ICD-10-CM | POA: Diagnosis present

## 2021-02-22 DIAGNOSIS — R5381 Other malaise: Secondary | ICD-10-CM | POA: Diagnosis present

## 2021-02-22 DIAGNOSIS — Z6831 Body mass index (BMI) 31.0-31.9, adult: Secondary | ICD-10-CM

## 2021-02-22 DIAGNOSIS — E1010 Type 1 diabetes mellitus with ketoacidosis without coma: Secondary | ICD-10-CM

## 2021-02-22 DIAGNOSIS — K660 Peritoneal adhesions (postprocedural) (postinfection): Secondary | ICD-10-CM | POA: Diagnosis present

## 2021-02-22 DIAGNOSIS — N493 Fournier gangrene: Principal | ICD-10-CM | POA: Clinically undetermined

## 2021-02-22 DIAGNOSIS — Z939 Artificial opening status, unspecified: Secondary | ICD-10-CM

## 2021-02-22 LAB — MRSA NEXT GEN BY PCR, NASAL: MRSA by PCR Next Gen: NOT DETECTED

## 2021-02-22 LAB — COMPREHENSIVE METABOLIC PANEL
ALT: 10 U/L (ref 0–44)
AST: 10 U/L — ABNORMAL LOW (ref 15–41)
Albumin: 3.3 g/dL — ABNORMAL LOW (ref 3.5–5.0)
Alkaline Phosphatase: 80 U/L (ref 38–126)
Anion gap: 19 — ABNORMAL HIGH (ref 5–15)
BUN: 31 mg/dL — ABNORMAL HIGH (ref 6–20)
CO2: 17 mmol/L — ABNORMAL LOW (ref 22–32)
Calcium: 8.9 mg/dL (ref 8.9–10.3)
Chloride: 102 mmol/L (ref 98–111)
Creatinine, Ser: 1.41 mg/dL — ABNORMAL HIGH (ref 0.61–1.24)
GFR, Estimated: 59 mL/min — ABNORMAL LOW (ref >60)
Glucose, Bld: 498 mg/dL — ABNORMAL HIGH (ref 70–99)
Potassium: 4.1 mmol/L (ref 3.5–5.1)
Sodium: 138 mmol/L (ref 135–145)
Total Bilirubin: 1.4 mg/dL — ABNORMAL HIGH (ref 0.3–1.2)
Total Protein: 7.7 g/dL (ref 6.5–8.1)

## 2021-02-22 LAB — BASIC METABOLIC PANEL
Anion gap: 13 (ref 5–15)
Anion gap: 13 (ref 5–15)
Anion gap: 9 (ref 5–15)
BUN: 27 mg/dL — ABNORMAL HIGH (ref 6–20)
BUN: 27 mg/dL — ABNORMAL HIGH (ref 6–20)
BUN: 25 mg/dL — ABNORMAL HIGH (ref 6–20)
CO2: 23 mmol/L (ref 22–32)
CO2: 24 mmol/L (ref 22–32)
CO2: 26 mmol/L (ref 22–32)
Calcium: 8.9 mg/dL (ref 8.9–10.3)
Calcium: 8.8 mg/dL — ABNORMAL LOW (ref 8.9–10.3)
Calcium: 8.6 mg/dL — ABNORMAL LOW (ref 8.9–10.3)
Chloride: 109 mmol/L (ref 98–111)
Chloride: 108 mmol/L (ref 98–111)
Chloride: 110 mmol/L (ref 98–111)
Creatinine, Ser: 1.39 mg/dL — ABNORMAL HIGH (ref 0.61–1.24)
Creatinine, Ser: 1.21 mg/dL (ref 0.61–1.24)
Creatinine, Ser: 1.21 mg/dL (ref 0.61–1.24)
GFR, Estimated: 60 mL/min — ABNORMAL LOW (ref >60)
GFR, Estimated: >60 mL/min (ref >60)
GFR, Estimated: >60 mL/min (ref >60)
Glucose, Bld: 238 mg/dL — ABNORMAL HIGH (ref 70–99)
Glucose, Bld: 213 mg/dL — ABNORMAL HIGH (ref 70–99)
Glucose, Bld: 174 mg/dL — ABNORMAL HIGH (ref 70–99)
Potassium: 3.9 mmol/L (ref 3.5–5.1)
Potassium: 3.6 mmol/L (ref 3.5–5.1)
Potassium: 3.7 mmol/L (ref 3.5–5.1)
Sodium: 145 mmol/L (ref 135–145)
Sodium: 145 mmol/L (ref 135–145)
Sodium: 145 mmol/L (ref 135–145)

## 2021-02-22 LAB — I-STAT CHEM 8, ED
BUN: 28 mg/dL — ABNORMAL HIGH (ref 6–20)
Calcium, Ion: 1.11 mmol/L — ABNORMAL LOW (ref 1.15–1.40)
Chloride: 106 mmol/L (ref 98–111)
Creatinine, Ser: 1.2 mg/dL (ref 0.61–1.24)
Glucose, Bld: 520 mg/dL (ref 70–99)
HCT: 39 % (ref 39.0–52.0)
Hemoglobin: 13.3 g/dL (ref 13.0–17.0)
Potassium: 4.1 mmol/L (ref 3.5–5.1)
Sodium: 139 mmol/L (ref 135–145)
TCO2: 18 mmol/L — ABNORMAL LOW (ref 22–32)

## 2021-02-22 LAB — RESP PANEL BY RT-PCR (FLU A&B, COVID) ARPGX2
Influenza A by PCR: NEGATIVE
Influenza B by PCR: NEGATIVE
SARS Coronavirus 2 by RT PCR: NEGATIVE

## 2021-02-22 LAB — CBC WITH DIFFERENTIAL/PLATELET
Abs Immature Granulocytes: 0.1 10*3/uL — ABNORMAL HIGH (ref 0.00–0.07)
Basophils Absolute: 0 10*3/uL (ref 0.0–0.1)
Basophils Relative: 0 %
Eosinophils Absolute: 0 10*3/uL (ref 0.0–0.5)
Eosinophils Relative: 0 %
HCT: 40.5 % (ref 39.0–52.0)
Hemoglobin: 13.2 g/dL (ref 13.0–17.0)
Immature Granulocytes: 1 %
Lymphocytes Relative: 5 %
Lymphs Abs: 0.7 10*3/uL (ref 0.7–4.0)
MCH: 29 pg (ref 26.0–34.0)
MCHC: 32.6 g/dL (ref 30.0–36.0)
MCV: 89 fL (ref 80.0–100.0)
Monocytes Absolute: 1 10*3/uL (ref 0.1–1.0)
Monocytes Relative: 8 %
Neutro Abs: 11.4 10*3/uL — ABNORMAL HIGH (ref 1.7–7.7)
Neutrophils Relative %: 86 %
Platelets: 286 10*3/uL (ref 150–400)
RBC: 4.55 MIL/uL (ref 4.22–5.81)
RDW: 14.7 % (ref 11.5–15.5)
WBC: 13.2 10*3/uL — ABNORMAL HIGH (ref 4.0–10.5)
nRBC: 0 % (ref 0.0–0.2)

## 2021-02-22 LAB — CBG MONITORING, ED
Glucose-Capillary: 424 mg/dL — ABNORMAL HIGH (ref 70–99)
Glucose-Capillary: 465 mg/dL — ABNORMAL HIGH (ref 70–99)
Glucose-Capillary: 430 mg/dL — ABNORMAL HIGH (ref 70–99)
Glucose-Capillary: 437 mg/dL — ABNORMAL HIGH (ref 70–99)

## 2021-02-22 LAB — HEMOGLOBIN A1C
Hgb A1c MFr Bld: 9.5 % — ABNORMAL HIGH (ref 4.8–5.6)
Mean Plasma Glucose: 225.95 mg/dL

## 2021-02-22 LAB — GLUCOSE, CAPILLARY
Glucose-Capillary: 333 mg/dL — ABNORMAL HIGH (ref 70–99)
Glucose-Capillary: 239 mg/dL — ABNORMAL HIGH (ref 70–99)
Glucose-Capillary: 214 mg/dL — ABNORMAL HIGH (ref 70–99)
Glucose-Capillary: 214 mg/dL — ABNORMAL HIGH (ref 70–99)
Glucose-Capillary: 205 mg/dL — ABNORMAL HIGH (ref 70–99)
Glucose-Capillary: 210 mg/dL — ABNORMAL HIGH (ref 70–99)
Glucose-Capillary: 190 mg/dL — ABNORMAL HIGH (ref 70–99)
Glucose-Capillary: 152 mg/dL — ABNORMAL HIGH (ref 70–99)
Glucose-Capillary: 181 mg/dL — ABNORMAL HIGH (ref 70–99)

## 2021-02-22 LAB — BLOOD GAS, VENOUS
Acid-base deficit: 8.9 mmol/L — ABNORMAL HIGH (ref 0.0–2.0)
Bicarbonate: 15.5 mmol/L — ABNORMAL LOW (ref 20.0–28.0)
FIO2: 21
O2 Saturation: 88.1 %
Patient temperature: 98.6
pCO2, Ven: 30.2 mmHg — ABNORMAL LOW (ref 44.0–60.0)
pH, Ven: 7.33 (ref 7.250–7.430)
pO2, Ven: 64.5 mmHg — ABNORMAL HIGH (ref 32.0–45.0)

## 2021-02-22 LAB — BETA-HYDROXYBUTYRIC ACID
Beta-Hydroxybutyric Acid: 5.56 mmol/L — ABNORMAL HIGH (ref 0.05–0.27)
Beta-Hydroxybutyric Acid: 1.9 mmol/L — ABNORMAL HIGH (ref 0.05–0.27)
Beta-Hydroxybutyric Acid: 1.92 mmol/L — ABNORMAL HIGH (ref 0.05–0.27)
Beta-Hydroxybutyric Acid: 0.4 mmol/L — ABNORMAL HIGH (ref 0.05–0.27)

## 2021-02-22 LAB — HIV ANTIBODY (ROUTINE TESTING W REFLEX): HIV Screen 4th Generation wRfx: NONREACTIVE

## 2021-02-22 MED ORDER — DEXTROSE 50 % IV SOLN: 0.0000 mL | INTRAVENOUS | Status: DC | PRN

## 2021-02-22 MED ORDER — ALUM & MAG HYDROXIDE-SIMETH 200-200-20 MG/5ML PO SUSP
30.0000 mL | Freq: Once | ORAL | Status: AC
  Administered 2021-02-22: 30 mL via ORAL
  Filled 2021-02-22: qty 30

## 2021-02-22 MED ORDER — METOPROLOL TARTRATE 5 MG/5ML IV SOLN
5.0000 mg | Freq: Four times a day (QID) | INTRAVENOUS | Status: DC | PRN
  Administered 2021-02-23 (×2): 5 mg via INTRAVENOUS
  Filled 2021-02-22 (×2): qty 5

## 2021-02-22 MED ORDER — PANTOPRAZOLE SODIUM 40 MG IV SOLR
40.0000 mg | Freq: Once | INTRAVENOUS | Status: DC
  Filled 2021-02-22: qty 40

## 2021-02-22 MED ORDER — ONDANSETRON HCL 4 MG/2ML IJ SOLN
4.0000 mg | Freq: Once | INTRAMUSCULAR | Status: AC
  Administered 2021-02-22: 4 mg via INTRAVENOUS
  Filled 2021-02-22: qty 2

## 2021-02-22 MED ORDER — DEXTROSE IN LACTATED RINGERS 5 % IV SOLN: INTRAVENOUS | Status: DC

## 2021-02-22 MED ORDER — SODIUM CHLORIDE 0.9 % IV BOLUS
2000.0000 mL | Freq: Once | INTRAVENOUS | Status: AC
  Administered 2021-02-22: 2000 mL via INTRAVENOUS

## 2021-02-22 MED ORDER — INSULIN REGULAR(HUMAN) IN NACL 100-0.9 UT/100ML-% IV SOLN
INTRAVENOUS | Status: DC
  Administered 2021-02-22: 10 [IU]/h via INTRAVENOUS
  Administered 2021-02-22: 15 [IU]/h via INTRAVENOUS
  Administered 2021-02-23: 4.2 [IU]/h via INTRAVENOUS
  Filled 2021-02-22 (×3): qty 100

## 2021-02-22 MED ORDER — POTASSIUM CHLORIDE 10 MEQ/100ML IV SOLN
10.0000 meq | INTRAVENOUS | Status: AC
  Administered 2021-02-22: 10 meq via INTRAVENOUS
  Filled 2021-02-22: qty 100

## 2021-02-22 MED ORDER — ONDANSETRON HCL 4 MG/2ML IJ SOLN
4.0000 mg | Freq: Four times a day (QID) | INTRAMUSCULAR | Status: DC | PRN
  Administered 2021-02-22 – 2021-02-25 (×8): 4 mg via INTRAVENOUS
  Filled 2021-02-22 (×8): qty 2

## 2021-02-22 MED ORDER — ENOXAPARIN SODIUM 40 MG/0.4ML IJ SOSY
40.0000 mg | PREFILLED_SYRINGE | INTRAMUSCULAR | Status: DC
  Administered 2021-02-22 – 2021-02-23 (×2): 40 mg via SUBCUTANEOUS
  Filled 2021-02-22: qty 0.4

## 2021-02-22 MED ORDER — HYDRALAZINE HCL 20 MG/ML IJ SOLN
10.0000 mg | Freq: Three times a day (TID) | INTRAMUSCULAR | Status: DC | PRN
  Administered 2021-02-22: 10 mg via INTRAVENOUS
  Filled 2021-02-22: qty 1

## 2021-02-22 MED ORDER — LIDOCAINE VISCOUS HCL 2 % MT SOLN
15.0000 mL | Freq: Once | OROMUCOSAL | Status: AC
  Administered 2021-02-22: 15 mL via ORAL
  Filled 2021-02-22: qty 15

## 2021-02-22 MED ORDER — LACTATED RINGERS IV SOLN: INTRAVENOUS | Status: DC

## 2021-02-22 MED ORDER — CHLORHEXIDINE GLUCONATE CLOTH 2 % EX PADS
6.0000 | MEDICATED_PAD | Freq: Every day | CUTANEOUS | Status: DC
  Administered 2021-02-23 – 2021-03-09 (×13): 6 via TOPICAL

## 2021-02-22 MED ORDER — ORAL CARE MOUTH RINSE
15.0000 mL | Freq: Two times a day (BID) | OROMUCOSAL | Status: DC
  Administered 2021-02-22 – 2021-03-10 (×26): 15 mL via OROMUCOSAL

## 2021-02-22 MED ORDER — ACETAMINOPHEN 325 MG PO TABS
650.0000 mg | ORAL_TABLET | Freq: Four times a day (QID) | ORAL | Status: DC | PRN
  Administered 2021-02-22 – 2021-02-24 (×4): 650 mg via ORAL
  Filled 2021-02-22 (×4): qty 2

## 2021-02-22 NOTE — ED Provider Notes (Signed)
[facility]-EMERGENCY DEPT Provider Note   CSN: [id number] Arrival date & time: 02/22/21  1033     History No chief complaint on file.   Andres Richardson is a 55 y.o. male.  Patient states he has been vomiting for 4 days.  No diarrhea no fever no chills.  He is afraid he does not DKA   Emesis Severity:  Moderate Duration: 4 days. Timing:  Constant Quality:  Bilious material Able to tolerate:  Liquids Progression:  Worsening Chronicity:  Recurrent Recent urination:  Normal Relieved by:  Nothing Worsened by:  Nothing Ineffective treatments:  None tried Associated symptoms: no abdominal pain, no cough, no diarrhea and no headaches       Past Medical History:  Diagnosis Date   Diabetes mellitus without complication (HCC)    Type !! 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 Diabetic neuropathy.   Hyperlipidemia    Mixed   Hypertension    Essential   Microalbuminuria    Obesity    Peripheral arterial disease (HCC)    With Claudication   Tobacco use disorder     Patient Active Problem List   Diagnosis Date Noted   DKA (diabetic ketoacidoses) 05/11/2019    No past surgical history on file.     No family history on file.  Social History   Tobacco Use   Smoking status: Every Day    Packs/day: 1.00    Pack years: 0.00    Types: Cigarettes   Smokeless tobacco: Never   Tobacco comments:    Uses Nicorette Gum.     Home Medications Prior to Admission medications   Medication Sig Start Date End Date Taking? Authorizing Provider  insulin detemir (LEVEMIR) 100 UNIT/ML injection Inject 0.15 mLs (15 Units total) into the skin 2 (two) times daily. 05/14/19   Glade Lloyd, MD  Insulin Syringe-Needle U-100 (INSULIN SYRINGE .3CC/31GX5/16") 31G X 5/16" 0.3 ML MISC Levemir 15 units twice a day 05/14/19   Glade Lloyd, MD  ondansetron (ZOFRAN) 4 MG tablet Take 1 tablet (4 mg total) by mouth every 6 (six) hours as needed for nausea or  vomiting. 05/14/19   Glade Lloyd, MD  pantoprazole (PROTONIX) 40 MG tablet Take 1 tablet (40 mg total) by mouth daily. 05/14/19 06/13/19  Glade Lloyd, MD  phenol (CHLORASEPTIC) 1.4 % LIQD Use as directed 1 spray in the mouth or throat as needed for throat irritation / pain. 05/14/19   Glade Lloyd, MD    Allergies    Adhesive [tape]  Review of Systems   Review of Systems  Constitutional:  Negative for appetite change and fatigue.  HENT:  Negative for congestion, ear discharge and sinus pressure.   Eyes:  Negative for discharge.  Respiratory:  Negative for cough.   Cardiovascular:  Negative for chest pain.  Gastrointestinal:  Positive for vomiting. Negative for abdominal pain and diarrhea.  Genitourinary:  Negative for frequency and hematuria.  Musculoskeletal:  Negative for back pain.  Skin:  Negative for rash.  Neurological:  Negative for seizures and headaches.  Psychiatric/Behavioral:  Negative for hallucinations.    Physical Exam Updated Vital Signs BP (!) 189/86 (BP Location: Right Arm)   Pulse (!) 107   Temp 98.7 F (37.1 C) (Oral)   Resp 17   Ht 5\' 11"  (1.803 m)   Wt 93 kg   SpO2 99%   BMI 28.60 kg/m   Physical Exam Vitals reviewed.  Constitutional:      Appearance: He is well-developed.  HENT:  Head: Normocephalic.     Nose: Nose normal.     Mouth/Throat:     Comments: Dry mucous membrane Eyes:     General: No scleral icterus.    Conjunctiva/sclera: Conjunctivae normal.  Neck:     Thyroid: No thyromegaly.  Cardiovascular:     Rate and Rhythm: Normal rate and regular rhythm.     Heart sounds: No murmur heard.   No friction rub. No gallop.  Pulmonary:     Breath sounds: No stridor. No wheezing or rales.  Chest:     Chest wall: No tenderness.  Abdominal:     General: There is no distension.     Tenderness: There is no abdominal tenderness. There is no rebound.  Musculoskeletal:        General: Normal range of motion.     Cervical back: Neck  supple.  Lymphadenopathy:     Cervical: No cervical adenopathy.  Skin:    Findings: No erythema or rash.  Neurological:     Mental Status: He is alert and oriented to person, place, and time.     Motor: No abnormal muscle tone.     Coordination: Coordination normal.  Psychiatric:        Behavior: Behavior normal.    ED Results / Procedures / Treatments   Labs (all labs ordered are listed, but only abnormal results are displayed) Labs Reviewed  CBC WITH DIFFERENTIAL/PLATELET - Abnormal; Notable for the following components:      Result Value   WBC 13.2 (*)    Neutro Abs 11.4 (*)    Abs Immature Granulocytes 0.10 (*)    All other components within normal limits  COMPREHENSIVE METABOLIC PANEL - Abnormal; Notable for the following components:   CO2 17 (*)    Glucose, Bld 498 (*)    BUN 31 (*)    Creatinine, Ser 1.41 (*)    Albumin 3.3 (*)    AST 10 (*)    Total Bilirubin 1.4 (*)    GFR, Estimated 59 (*)    Anion gap 19 (*)    All other components within normal limits  CBG MONITORING, ED - Abnormal; Notable for the following components:   Glucose-Capillary 424 (*)    All other components within normal limits  I-STAT CHEM 8, ED - Abnormal; Notable for the following components:   BUN 28 (*)    Glucose, Bld 520 (*)    Calcium, Ion 1.11 (*)    TCO2 18 (*)    All other components within normal limits  CBG MONITORING, ED - Abnormal; Notable for the following components:   Glucose-Capillary 465 (*)    All other components within normal limits  RESP PANEL BY RT-PCR (FLU A&B, COVID) ARPGX2  BETA-HYDROXYBUTYRIC ACID  BETA-HYDROXYBUTYRIC ACID  URINALYSIS, ROUTINE W REFLEX MICROSCOPIC  BLOOD GAS, VENOUS  CBG MONITORING, ED    EKG None  Radiology No results found.  Procedures Procedures   Medications Ordered in ED Medications  pantoprazole (PROTONIX) injection 40 mg (has no administration in time range)  alum & mag hydroxide-simeth (MAALOX/MYLANTA) 200-200-20 MG/5ML  suspension 30 mL (has no administration in time range)    And  lidocaine (XYLOCAINE) 2 % viscous mouth solution 15 mL (has no administration in time range)  insulin regular, human (MYXREDLIN) 100 units/ 100 mL infusion (has no administration in time range)  lactated ringers infusion (has no administration in time range)  dextrose 5 % in lactated ringers infusion (0 mLs Intravenous Hold 02/22/21 1259)  dextrose 50 % solution 0-50 mL (has no administration in time range)  potassium chloride 10 mEq in 100 mL IVPB (has no administration in time range)  sodium chloride 0.9 % bolus 2,000 mL (2,000 mLs Intravenous New Bag/Given 02/22/21 1143)  ondansetron (ZOFRAN) injection 4 mg (4 mg Intravenous Given 02/22/21 1137)    ED Course  I have reviewed the triage vital signs and the nursing notes.  Pertinent labs & imaging results that were available during my care of the patient were reviewed by me and considered in my medical decision making (see chart for details). CRITICAL CARE Performed by: Bethann Berkshire Total critical care time: 35 minutes Critical care time was exclusive of separately billable procedures and treating other patients. Critical care was necessary to treat or prevent imminent or life-threatening deterioration. Critical care was time spent personally by me on the following activities: development of treatment plan with patient and/or surrogate as well as nursing, discussions with consultants, evaluation of patient's response to treatment, examination of patient, obtaining history from patient or surrogate, ordering and performing treatments and interventions, ordering and review of laboratory studies, ordering and review of radiographic studies, pulse oximetry and re-evaluation of patient's condition.    MDM Rules/Calculators/A&P                         Patient in DKA.  He is started on insulin drip and will be admitted to medicine Final Clinical Impression(s) / ED Diagnoses Final  diagnoses:  None    Rx / DC Orders ED Discharge Orders     None        Bethann Berkshire, MD 02/22/21 1305

## 2021-02-22 NOTE — ED Triage Notes (Signed)
Patient states he is unable to keep anything down, unable to keep down his meds, abdominal pain from emesis x4 days. Endorses generalized fatigue. Hot and cold chills.

## 2021-02-22 NOTE — H&P (Signed)
History and Physical    Andres Richardson QRF:[id number] DOB: July 14, 1966 DOA: 02/22/2021  PCP: [name], MD  Patient coming from: Home  Chief Complaint: N/V  HPI: Andres Richardson is a 55 y.o. male with medical history significant of DM2, HLD, HTN. Presenting with N/V. His symptoms started 4 days ago. It was sudden onset. He was unable to keep solid, fluids or meds down. He states that his sugars have been under better control as of late and that he was trying to quit smoking. Despite reducing his diet to just fluids and taking pepto bismol, he could not get his nausea and vomiting under control. He eventually transitioned to just dry heaving. When his symptoms were still present this morning, he decided to come to the ED. He denies any other aggravating or alleviating factors.    ED Course: He was found to have elevated sugar, GAP, and was acidotic. He was started on Endotool. [facility] was called for admission.   Review of Systems:  Denies CP, dyspnea, palpitations, diarrhea, fever. Review of systems is otherwise negative for all not mentioned in HPI.   PMHx Past Medical History:  Diagnosis Date   Diabetes mellitus without complication (HCC)    Type !! 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 Diabetic neuropathy.   Hyperlipidemia    Mixed   Hypertension    Essential   Microalbuminuria    Obesity    Peripheral arterial disease (HCC)    With Claudication   Tobacco use disorder     PSHx No past surgical history on file.  SocHx  reports that he has been smoking. He has been smoking an average of 1.00 packs per day. He has never used smokeless tobacco. No history on file for alcohol use and drug use.  Allergies  Allergen Reactions   Adhesive [Tape] Rash    FamHx No family history on file.  Prior to Admission medications   Medication Sig Start Date End Date Taking? Authorizing Provider  insulin detemir (LEVEMIR) 100 UNIT/ML injection Inject 0.15 mLs (15 Units total) into  the skin 2 (two) times daily. 05/14/19   Glade Lloyd, MD  Insulin Syringe-Needle U-100 (INSULIN SYRINGE .3CC/31GX5/16") 31G X 5/16" 0.3 ML MISC Levemir 15 units twice a day 05/14/19   Glade Lloyd, MD  ondansetron (ZOFRAN) 4 MG tablet Take 1 tablet (4 mg total) by mouth every 6 (six) hours as needed for nausea or vomiting. 05/14/19   Glade Lloyd, MD  pantoprazole (PROTONIX) 40 MG tablet Take 1 tablet (40 mg total) by mouth daily. 05/14/19 06/13/19  Glade Lloyd, MD  phenol (CHLORASEPTIC) 1.4 % LIQD Use as directed 1 spray in the mouth or throat as needed for throat irritation / pain. 05/14/19   Glade Lloyd, MD    Physical Exam: Vitals:   02/22/21 1043 02/22/21 1050 02/22/21 1211  BP: (!) 174/93  (!) 189/86  Pulse: (!) 122  (!) 107  Resp: 18  17  Temp: 98.7 F (37.1 C)    TempSrc: Oral    SpO2: 100%  99%  Weight:  93 kg   Height:  5\' 11"  (1.803 m)     General: 55 y.o. male resting in bed in NAD Eyes: PERRL, normal sclera ENMT: Nares patent w/o discharge, orophaynx clear, dentition normal, ears w/o discharge/lesions/ulcers Neck: Supple, trachea midline Cardiovascular: RRR, +S1, S2, no m/g/r, equal pulses throughout Respiratory: CTABL, no w/r/r, normal WOB GI: BS+, NDNT, no masses noted, no organomegaly noted MSK: No e/c/c Skin: No rashes, bruises, ulcerations  noted Neuro: A&O x 3, no focal deficits Psyc: Appropriate interaction and affect, calm/cooperative  Labs on Admission: I have personally reviewed following labs and imaging studies  CBC: Recent Labs  Lab 02/22/21 1118 02/22/21 1155  WBC 13.2*  --   NEUTROABS 11.4*  --   HGB 13.2 13.3  HCT 40.5 39.0  MCV 89.0  --   PLT 286  --    Basic Metabolic Panel: Recent Labs  Lab 02/22/21 1118 02/22/21 1155  NA 138 139  K 4.1 4.1  CL 102 106  CO2 17*  --   GLUCOSE 498* 520*  BUN 31* 28*  CREATININE 1.41* 1.20  CALCIUM 8.9  --    GFR: Estimated Creatinine Clearance: 81.1 mL/min (by C-G formula based on SCr  of 1.2 mg/dL). Liver Function Tests: Recent Labs  Lab 02/22/21 1118  AST 10*  ALT 10  ALKPHOS 80  BILITOT 1.4*  PROT 7.7  ALBUMIN 3.3*   No results for input(s): LIPASE, AMYLASE in the last 168 hours. No results for input(s): AMMONIA in the last 168 hours. Coagulation Profile: No results for input(s): INR, PROTIME in the last 168 hours. Cardiac Enzymes: No results for input(s): CKTOTAL, CKMB, CKMBINDEX, TROPONINI in the last 168 hours. BNP (last 3 results) No results for input(s): PROBNP in the last 8760 hours. HbA1C: No results for input(s): HGBA1C in the last 72 hours. CBG: Recent Labs  Lab 02/22/21 1042 02/22/21 1136  GLUCAP 424* 465*   Lipid Profile: No results for input(s): CHOL, HDL, LDLCALC, TRIG, CHOLHDL, LDLDIRECT in the last 72 hours. Thyroid Function Tests: No results for input(s): TSH, T4TOTAL, FREET4, T3FREE, THYROIDAB in the last 72 hours. Anemia Panel: No results for input(s): VITAMINB12, FOLATE, FERRITIN, TIBC, IRON, RETICCTPCT in the last 72 hours. Urine analysis:    Component Value Date/Time   COLORURINE YELLOW 05/10/2019 2350   APPEARANCEUR CLEAR 05/10/2019 2350   LABSPEC 1.021 05/10/2019 2350   PHURINE 5.0 05/10/2019 2350   GLUCOSEU >=500 (A) 05/10/2019 2350   HGBUR SMALL (A) 05/10/2019 2350   BILIRUBINUR NEGATIVE 05/10/2019 2350   KETONESUR 80 (A) 05/10/2019 2350   PROTEINUR 30 (A) 05/10/2019 2350   NITRITE NEGATIVE 05/10/2019 2350   LEUKOCYTESUR NEGATIVE 05/10/2019 2350    Radiological Exams on Admission: No results found.  EKG: None obtained in ED  Assessment/Plan DM2 DKA N/V     - admit to inpt, SDU     - insulin gtt, fluids; A1c     - NPO for now except for non-calorics     - anti-emetics     - DM coordinator  HTN     - says he doesn't take BP meds     - with N/V, will start him on PRN Iv hydralazine first; as he is able to tolerate PO; will transition to PO meds  DVT prophylaxis: lovenox  Code Status: DNR  Family  Communication: w/ brother at bedside  Consults called: None   Status is: Inpatient  Remains inpatient appropriate because:Inpatient level of care appropriate due to severity of illness  Dispo: The patient is from: Home              Anticipated d/c is to: Home              Patient currently is not medically stable to d/c.   Difficult to place patient No  Time spent coordinating admission: 45 minutes  Clea Dubach A Brittanya Winburn DO [facility]  If 7PM-7AM, please contact night-coverage [email or web address]  02/22/2021, 1:09 PM

## 2021-02-23 ENCOUNTER — Encounter (HOSPITAL_COMMUNITY): Payer: Self-pay | Admitting: Internal Medicine

## 2021-02-23 ENCOUNTER — Inpatient Hospital Stay (HOSPITAL_COMMUNITY): Payer: Self-pay

## 2021-02-23 DIAGNOSIS — N493 Fournier gangrene: Secondary | ICD-10-CM | POA: Clinically undetermined

## 2021-02-23 LAB — BASIC METABOLIC PANEL
Anion gap: 10 (ref 5–15)
Anion gap: 10 (ref 5–15)
Anion gap: 10 (ref 5–15)
BUN: 25 mg/dL — ABNORMAL HIGH (ref 6–20)
BUN: 24 mg/dL — ABNORMAL HIGH (ref 6–20)
BUN: 20 mg/dL (ref 6–20)
CO2: 27 mmol/L (ref 22–32)
CO2: 20 mmol/L — ABNORMAL LOW (ref 22–32)
CO2: 25 mmol/L (ref 22–32)
Calcium: 8.6 mg/dL — ABNORMAL LOW (ref 8.9–10.3)
Calcium: 8.6 mg/dL — ABNORMAL LOW (ref 8.9–10.3)
Calcium: 8.6 mg/dL — ABNORMAL LOW (ref 8.9–10.3)
Chloride: 110 mmol/L (ref 98–111)
Chloride: 116 mmol/L — ABNORMAL HIGH (ref 98–111)
Chloride: 110 mmol/L (ref 98–111)
Creatinine, Ser: 1.14 mg/dL (ref 0.61–1.24)
Creatinine, Ser: 1.34 mg/dL — ABNORMAL HIGH (ref 0.61–1.24)
Creatinine, Ser: 1.08 mg/dL (ref 0.61–1.24)
GFR, Estimated: >60 mL/min (ref >60)
GFR, Estimated: >60 mL/min (ref >60)
GFR, Estimated: >60 mL/min (ref >60)
Glucose, Bld: 165 mg/dL — ABNORMAL HIGH (ref 70–99)
Glucose, Bld: 165 mg/dL — ABNORMAL HIGH (ref 70–99)
Glucose, Bld: 177 mg/dL — ABNORMAL HIGH (ref 70–99)
Potassium: 3.6 mmol/L (ref 3.5–5.1)
Potassium: 5 mmol/L (ref 3.5–5.1)
Potassium: 3.6 mmol/L (ref 3.5–5.1)
Sodium: 147 mmol/L — ABNORMAL HIGH (ref 135–145)
Sodium: 146 mmol/L — ABNORMAL HIGH (ref 135–145)
Sodium: 145 mmol/L (ref 135–145)

## 2021-02-23 LAB — CBC
HCT: 33.3 % — ABNORMAL LOW (ref 39.0–52.0)
Hemoglobin: 10.9 g/dL — ABNORMAL LOW (ref 13.0–17.0)
MCH: 29.2 pg (ref 26.0–34.0)
MCHC: 32.7 g/dL (ref 30.0–36.0)
MCV: 89.3 fL (ref 80.0–100.0)
Platelets: 266 10*3/uL (ref 150–400)
RBC: 3.73 MIL/uL — ABNORMAL LOW (ref 4.22–5.81)
RDW: 14.9 % (ref 11.5–15.5)
WBC: 12.6 10*3/uL — ABNORMAL HIGH (ref 4.0–10.5)
nRBC: 0 % (ref 0.0–0.2)

## 2021-02-23 LAB — GLUCOSE, CAPILLARY
Glucose-Capillary: 147 mg/dL — ABNORMAL HIGH (ref 70–99)
Glucose-Capillary: 153 mg/dL — ABNORMAL HIGH (ref 70–99)
Glucose-Capillary: 156 mg/dL — ABNORMAL HIGH (ref 70–99)
Glucose-Capillary: 159 mg/dL — ABNORMAL HIGH (ref 70–99)
Glucose-Capillary: 160 mg/dL — ABNORMAL HIGH (ref 70–99)
Glucose-Capillary: 161 mg/dL — ABNORMAL HIGH (ref 70–99)
Glucose-Capillary: 163 mg/dL — ABNORMAL HIGH (ref 70–99)
Glucose-Capillary: 172 mg/dL — ABNORMAL HIGH (ref 70–99)
Glucose-Capillary: 178 mg/dL — ABNORMAL HIGH (ref 70–99)
Glucose-Capillary: 196 mg/dL — ABNORMAL HIGH (ref 70–99)
Glucose-Capillary: 213 mg/dL — ABNORMAL HIGH (ref 70–99)
Glucose-Capillary: 251 mg/dL — ABNORMAL HIGH (ref 70–99)

## 2021-02-23 LAB — BETA-HYDROXYBUTYRIC ACID
Beta-Hydroxybutyric Acid: 0.29 mmol/L — ABNORMAL HIGH (ref 0.05–0.27)
Beta-Hydroxybutyric Acid: 0.36 mmol/L — ABNORMAL HIGH (ref 0.05–0.27)

## 2021-02-23 LAB — VITAMIN B12: Vitamin B-12: 158 pg/mL — ABNORMAL LOW (ref 180–914)

## 2021-02-23 LAB — FOLATE: Folate: 7.8 ng/mL (ref >5.9)

## 2021-02-23 MED ORDER — CYANOCOBALAMIN 1000 MCG/ML IJ SOLN
1000.0000 ug | Freq: Every day | INTRAMUSCULAR | Status: DC
  Administered 2021-02-23 – 2021-03-10 (×16): 1000 ug via INTRAMUSCULAR
  Filled 2021-02-23 (×17): qty 1

## 2021-02-23 MED ORDER — CLINDAMYCIN PHOSPHATE 600 MG/50ML IV SOLN
600.0000 mg | Freq: Three times a day (TID) | INTRAVENOUS | Status: AC
  Administered 2021-02-23 – 2021-02-26 (×10): 600 mg via INTRAVENOUS
  Filled 2021-02-23 (×11): qty 50

## 2021-02-23 MED ORDER — INSULIN ASPART 100 UNIT/ML IJ SOLN
0.0000 [IU] | INTRAMUSCULAR | Status: DC
  Administered 2021-02-23: 8 [IU] via SUBCUTANEOUS
  Administered 2021-02-23: 3 [IU] via SUBCUTANEOUS
  Administered 2021-02-24: 5 [IU] via SUBCUTANEOUS
  Administered 2021-02-24: 3 [IU] via SUBCUTANEOUS
  Administered 2021-02-24: 5 [IU] via SUBCUTANEOUS
  Administered 2021-02-24: 8 [IU] via SUBCUTANEOUS
  Administered 2021-02-24 (×2): 5 [IU] via SUBCUTANEOUS
  Administered 2021-02-25: 8 [IU] via SUBCUTANEOUS
  Administered 2021-02-25: 3 [IU] via SUBCUTANEOUS
  Administered 2021-02-25: 8 [IU] via SUBCUTANEOUS
  Administered 2021-02-25: 11 [IU] via SUBCUTANEOUS
  Administered 2021-02-25: 3 [IU] via SUBCUTANEOUS
  Administered 2021-02-26 (×2): 2 [IU] via SUBCUTANEOUS
  Administered 2021-02-26: 3 [IU] via SUBCUTANEOUS
  Administered 2021-02-26: 2 [IU] via SUBCUTANEOUS
  Administered 2021-02-27: 3 [IU] via SUBCUTANEOUS
  Administered 2021-02-27 – 2021-02-28 (×2): 2 [IU] via SUBCUTANEOUS
  Administered 2021-02-28 (×3): 5 [IU] via SUBCUTANEOUS
  Administered 2021-02-28: 3 [IU] via SUBCUTANEOUS
  Administered 2021-03-01: 11 [IU] via SUBCUTANEOUS
  Administered 2021-03-01 (×2): 5 [IU] via SUBCUTANEOUS
  Administered 2021-03-01 (×2): 2 [IU] via SUBCUTANEOUS
  Administered 2021-03-02: 3 [IU] via SUBCUTANEOUS
  Administered 2021-03-02 (×2): 2 [IU] via SUBCUTANEOUS
  Administered 2021-03-02: 3 [IU] via SUBCUTANEOUS
  Administered 2021-03-02 (×2): 5 [IU] via SUBCUTANEOUS
  Administered 2021-03-03: 2 [IU] via SUBCUTANEOUS
  Administered 2021-03-03 (×2): 3 [IU] via SUBCUTANEOUS
  Administered 2021-03-03: 2 [IU] via SUBCUTANEOUS
  Administered 2021-03-03: 5 [IU] via SUBCUTANEOUS
  Administered 2021-03-03: 2 [IU] via SUBCUTANEOUS
  Administered 2021-03-04: 3 [IU] via SUBCUTANEOUS
  Administered 2021-03-04: 2 [IU] via SUBCUTANEOUS
  Administered 2021-03-04 (×2): 3 [IU] via SUBCUTANEOUS
  Administered 2021-03-04: 5 [IU] via SUBCUTANEOUS
  Administered 2021-03-04 – 2021-03-05 (×3): 3 [IU] via SUBCUTANEOUS
  Administered 2021-03-05: 2 [IU] via SUBCUTANEOUS
  Administered 2021-03-05: 5 [IU] via SUBCUTANEOUS
  Administered 2021-03-05: 3 [IU] via SUBCUTANEOUS
  Administered 2021-03-06: 2 [IU] via SUBCUTANEOUS
  Administered 2021-03-06: 5 [IU] via SUBCUTANEOUS
  Administered 2021-03-06 (×3): 3 [IU] via SUBCUTANEOUS
  Administered 2021-03-07: 2 [IU] via SUBCUTANEOUS
  Administered 2021-03-07: 3 [IU] via SUBCUTANEOUS
  Administered 2021-03-07: 5 [IU] via SUBCUTANEOUS
  Administered 2021-03-07: 3 [IU] via SUBCUTANEOUS
  Administered 2021-03-07: 5 [IU] via SUBCUTANEOUS
  Administered 2021-03-08 (×3): 3 [IU] via SUBCUTANEOUS
  Administered 2021-03-08: 2 [IU] via SUBCUTANEOUS
  Administered 2021-03-08: 5 [IU] via SUBCUTANEOUS
  Administered 2021-03-09: 8 [IU] via SUBCUTANEOUS
  Administered 2021-03-09 – 2021-03-10 (×6): 3 [IU] via SUBCUTANEOUS
  Administered 2021-03-10: 2 [IU] via SUBCUTANEOUS

## 2021-02-23 MED ORDER — VANCOMYCIN HCL 2000 MG/400ML IV SOLN
2000.0000 mg | Freq: Once | INTRAVENOUS | Status: AC
  Administered 2021-02-23: 2000 mg via INTRAVENOUS
  Filled 2021-02-23: qty 400

## 2021-02-23 MED ORDER — SODIUM CHLORIDE 0.9 % IV SOLN
12.5000 mg | Freq: Four times a day (QID) | INTRAVENOUS | Status: DC | PRN
  Administered 2021-02-25: 12.5 mg via INTRAVENOUS
  Filled 2021-02-23: qty 0.5
  Filled 2021-02-23: qty 12.5

## 2021-02-23 MED ORDER — SODIUM CHLORIDE (PF) 0.9 % IJ SOLN
INTRAMUSCULAR | Status: AC
  Filled 2021-02-23: qty 50

## 2021-02-23 MED ORDER — NICOTINE 21 MG/24HR TD PT24
21.0000 mg | MEDICATED_PATCH | Freq: Every day | TRANSDERMAL | Status: DC
  Administered 2021-02-23 – 2021-03-10 (×16): 21 mg via TRANSDERMAL
  Filled 2021-02-23 (×16): qty 1

## 2021-02-23 MED ORDER — INSULIN DETEMIR 100 UNIT/ML ~~LOC~~ SOLN
10.0000 [IU] | Freq: Two times a day (BID) | SUBCUTANEOUS | Status: DC
  Administered 2021-02-23 – 2021-02-24 (×3): 10 [IU] via SUBCUTANEOUS
  Filled 2021-02-23 (×4): qty 0.1

## 2021-02-23 MED ORDER — PROCHLORPERAZINE EDISYLATE 10 MG/2ML IJ SOLN
10.0000 mg | Freq: Four times a day (QID) | INTRAMUSCULAR | Status: DC | PRN
  Administered 2021-02-23 – 2021-02-25 (×5): 10 mg via INTRAVENOUS
  Filled 2021-02-23 (×6): qty 2

## 2021-02-23 MED ORDER — PIPERACILLIN-TAZOBACTAM 3.375 G IVPB 30 MIN
3.3750 g | Freq: Three times a day (TID) | INTRAVENOUS | Status: DC
  Administered 2021-02-23 – 2021-02-27 (×12): 3.375 g via INTRAVENOUS
  Filled 2021-02-23 (×20): qty 50

## 2021-02-23 MED ORDER — VANCOMYCIN HCL 1000 MG/200ML IV SOLN
1000.0000 mg | Freq: Two times a day (BID) | INTRAVENOUS | Status: DC
  Administered 2021-02-24 – 2021-02-25 (×3): 1000 mg via INTRAVENOUS
  Filled 2021-02-23 (×3): qty 200

## 2021-02-23 MED ORDER — MORPHINE SULFATE (PF) 2 MG/ML IV SOLN
2.0000 mg | INTRAVENOUS | Status: DC | PRN
  Administered 2021-02-23 – 2021-03-03 (×9): 2 mg via INTRAVENOUS
  Filled 2021-02-23 (×10): qty 1

## 2021-02-23 MED ORDER — DEXTROSE-NACL 5-0.45 % IV SOLN: INTRAVENOUS | Status: DC

## 2021-02-23 MED ORDER — IOHEXOL 350 MG/ML SOLN
80.0000 mL | Freq: Once | INTRAVENOUS | Status: AC | PRN
  Administered 2021-02-23: 80 mL via INTRAVENOUS

## 2021-02-23 NOTE — Consult Note (Signed)
General Surgery [facility]  Reason for Consult: Fournier's gangrene  Referring Physician: Dr. Lacretia Nicks, [facility]  Andres Richardson is an 55 y.o. male.  HPI: Patient is a 55 year old male admitted to the medical service with diabetic ketoacidosis.  The patient had complained of abdominal pain, nausea, and dry heaves.  A CT scan of the abdomen and pelvis was ordered for evaluation.  CT scan demonstrated findings of inflammation, a small abscess, and gas in the tissues of the perineum consistent with a developing Fournier's gangrene.  Patient states that he has had an infection in that area on the medial right buttock for approximately 2 weeks.  It has waxed and waned.  He has had some drainage.  He has had no prior significant infections of the perineum.  General surgery was called for evaluation and management.  Past Medical History:  Diagnosis Date   Diabetes mellitus without complication (HCC)    Type !! 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 Diabetic neuropathy.   Hyperlipidemia    Mixed   Hypertension    Essential   Microalbuminuria    Obesity    Peripheral arterial disease (HCC)    With Claudication   Tobacco use disorder     History reviewed. No pertinent surgical history.  History reviewed. No pertinent family history.  Social History:  reports that he has been smoking cigarettes. He has been smoking an average of 1.00 packs per day. He has never used smokeless tobacco. He reports that he does not drink alcohol and does not use drugs.  Allergies:  Allergies  Allergen Reactions   Adhesive [Tape] Rash    Medications: I have reviewed the patient's current medications.  Results for orders placed or performed during the hospital encounter of 02/22/21 (from the past 48 hour(s))  CBG monitoring, ED     Status: Abnormal   Collection Time: 02/22/21 10:42 AM  Result Value Ref Range   Glucose-Capillary 424 (H) 70 - 99 mg/dL     Comment: Glucose reference range applies only to samples taken after fasting for at least 8 hours.   Comment 1 Notify RN   CBC with Differential/Platelet     Status: Abnormal   Collection Time: 02/22/21 11:18 AM  Result Value Ref Range   WBC 13.2 (H) 4.0 - 10.5 K/uL   RBC 4.55 4.22 - 5.81 MIL/uL   Hemoglobin 13.2 13.0 - 17.0 g/dL   HCT 50.5 39.7 - 67.3 %   MCV 89.0 80.0 - 100.0 fL   MCH 29.0 26.0 - 34.0 pg   MCHC 32.6 30.0 - 36.0 g/dL   RDW 41.9 37.9 - 02.4 %   Platelets 286 150 - 400 K/uL   nRBC 0.0 0.0 - 0.2 %   Neutrophils Relative % 86 %   Neutro Abs 11.4 (H) 1.7 - 7.7 K/uL   Lymphocytes Relative 5 %   Lymphs Abs 0.7 0.7 - 4.0 K/uL   Monocytes Relative 8 %   Monocytes Absolute 1.0 0.1 - 1.0 K/uL   Eosinophils Relative 0 %   Eosinophils Absolute 0.0 0.0 - 0.5 K/uL   Basophils Relative 0 %   Basophils Absolute 0.0 0.0 - 0.1 K/uL   Immature Granulocytes 1 %   Abs Immature Granulocytes 0.10 (H) 0.00 - 0.07 K/uL    Comment: Performed at [facility], [address] [place], [place] [address]  Comprehensive metabolic panel     Status: Abnormal   Collection Time: 02/22/21 11:18 AM  Result Value Ref Range   Sodium 138 135 - 145 mmol/L   Potassium 4.1 3.5 - 5.1 mmol/L   Chloride 102 98 - 111 mmol/L   CO2 17 (L) 22 - 32 mmol/L   Glucose, Bld 498 (H) 70 - 99 mg/dL    Comment: Glucose reference range applies only to samples taken after fasting for at least 8 hours.   BUN 31 (H) 6 - 20 mg/dL   Creatinine, Ser 1.611.41 (H) 0.61 - 1.24 mg/dL   Calcium 8.9 8.9 - 09.610.3 mg/dL   Total Protein 7.7 6.5 - 8.1 g/dL   Albumin 3.3 (L) 3.5 - 5.0 g/dL   AST 10 (L) 15 - 41 U/L   ALT 10 0 - 44 U/L   Alkaline Phosphatase 80 38 - 126 U/L   Total Bilirubin 1.4 (H) 0.3 - 1.2 mg/dL   GFR, Estimated 59 (L) >60 mL/min    Comment: (NOTE) Calculated using the CKD-EPI Creatinine Equation (2021)    Anion gap 19 (H) 5 - 15    Comment: Performed at [facility], [address] [place], [place] [id number]  CBG monitoring, ED     Status: Abnormal   Collection Time: 02/22/21 11:36 AM  Result Value Ref Range   Glucose-Capillary 465 (H) 70 - 99 mg/dL    Comment: Glucose reference range applies only to samples taken after fasting for at least 8 hours.  I-stat chem 8, ED (not at [facility] or [facility])     Status: Abnormal   Collection Time: 02/22/21 11:55 AM  Result Value Ref Range   Sodium 139 135 - 145 mmol/L   Potassium 4.1 3.5 - 5.1 mmol/L   Chloride 106 98 - 111 mmol/L   BUN 28 (H) 6 - 20 mg/dL   Creatinine, Ser 9.811.20 0.61 - 1.24 mg/dL   Glucose, Bld 191520 (HH) 70 - 99 mg/dL    Comment: Glucose reference range applies only to samples taken after fasting for at least 8 hours.   Calcium, Ion 1.11 (L) 1.15 - 1.40 mmol/L   TCO2 18 (L) 22 - 32 mmol/L   Hemoglobin 13.3 13.0 - 17.0 g/dL   HCT 47.839.0 29.539.0 - 62.152.0 %   Comment NOTIFIED PHYSICIAN   Beta-hydroxybutyric acid     Status: Abnormal   Collection Time: 02/22/21  1:25 PM  Result Value Ref Range   Beta-Hydroxybutyric Acid 5.56 (H) 0.05 - 0.27 mmol/L    Comment: RESULTS CONFIRMED BY MANUAL DILUTION Performed at [facility], [address] [place], [place] [address]   Blood gas, venous     Status: Abnormal   Collection Time: 02/22/21  1:25 PM  Result Value Ref Range   FIO2 21.00    pH, Ven 7.330 7.250 - 7.430   pCO2, Ven 30.2 (L) 44.0 - 60.0 mmHg   pO2, Ven 64.5 (H) 32.0 - 45.0 mmHg   Bicarbonate 15.5 (L) 20.0 - 28.0 mmol/L   Acid-base deficit 8.9 (H) 0.0 - 2.0 mmol/L   O2 Saturation 88.1 %   Patient temperature 98.6     Comment: Performed at [facility], [address] [place], [place] [address]  Resp Panel by RT-PCR (Flu A&B, Covid) Nasopharyngeal Swab     Status: None   Collection Time: 02/22/21  1:25 PM   Specimen: Nasopharyngeal Swab; Nasopharyngeal(NP) swabs in vial transport medium  Result Value Ref Range   SARS Coronavirus 2 by RT PCR NEGATIVE NEGATIVE     Comment: (NOTE)  SARS-CoV-2 target nucleic acids are NOT DETECTED.  The SARS-CoV-2 RNA is generally detectable in upper respiratory specimens during the acute phase of infection. The lowest concentration of SARS-CoV-2 viral copies this assay can detect is 138 copies/mL. A negative result does not preclude SARS-Cov-2 infection and should not be used as the sole basis for treatment or other patient management decisions. A negative result may occur with  improper specimen collection/handling, submission of specimen other than nasopharyngeal swab, presence of viral mutation(s) within the areas targeted by this assay, and inadequate number of viral copies(<138 copies/mL). A negative result must be combined with clinical observations, patient history, and epidemiological information. The expected result is Negative.  Fact Sheet for Patients:  BloggerCourse.com  Fact Sheet for Healthcare Providers:  SeriousBroker.it  This test is no t yet approved or cleared by the [place] FDA and  has been authorized for detection and/or diagnosis of SARS-CoV-2 by FDA under an Emergency Use Authorization (EUA). This EUA will remain  in effect (meaning this test can be used) for the duration of the COVID-19 declaration under Section 564(b)(1) of the Act, 21 U.S.C.section 360bbb-3(b)(1), unless the authorization is terminated  or revoked sooner.       Influenza A by PCR NEGATIVE NEGATIVE   Influenza B by PCR NEGATIVE NEGATIVE    Comment: (NOTE) The Xpert Xpress SARS-CoV-2/FLU/RSV plus assay is intended as an aid in the diagnosis of influenza from Nasopharyngeal swab specimens and should not be used as a sole basis for treatment. Nasal washings and aspirates are unacceptable for Xpert Xpress SARS-CoV-2/FLU/RSV testing.  Fact Sheet for Patients: BloggerCourse.com  Fact Sheet for Healthcare  Providers: SeriousBroker.it  This test is not yet approved or cleared by the [place] FDA and has been authorized for detection and/or diagnosis of SARS-CoV-2 by FDA under an Emergency Use Authorization (EUA). This EUA will remain in effect (meaning this test can be used) for the duration of the COVID-19 declaration under Section 564(b)(1) of the Act, 21 U.S.C. section 360bbb-3(b)(1), unless the authorization is terminated or revoked.  Performed at [facility], [address], [place], [place] [address]   CBG monitoring, ED     Status: Abnormal   Collection Time: 02/22/21  1:26 PM  Result Value Ref Range   Glucose-Capillary 430 (H) 70 - 99 mg/dL    Comment: Glucose reference range applies only to samples taken after fasting for at least 8 hours.  CBG monitoring, ED     Status: Abnormal   Collection Time: 02/22/21  2:26 PM  Result Value Ref Range   Glucose-Capillary 437 (H) 70 - 99 mg/dL    Comment: Glucose reference range applies only to samples taken after fasting for at least 8 hours.  MRSA Next Gen by PCR, Nasal     Status: None   Collection Time: 02/22/21  3:10 PM   Specimen: Nasal Mucosa; Nasal Swab  Result Value Ref Range   MRSA by PCR Next Gen NOT DETECTED NOT DETECTED    Comment: (NOTE) The GeneXpert MRSA Assay (FDA approved for NASAL specimens only), is one component of a comprehensive MRSA colonization surveillance program. It is not intended to diagnose MRSA infection nor to guide or monitor treatment for MRSA infections. Test performance is not FDA approved in patients less than 23 years old. Performed at [facility], [address], [place], [place] [address]   Glucose, capillary     Status: Abnormal   Collection Time: 02/22/21  3:18 PM  Result Value Ref Range   Glucose-Capillary 333 (H) 70 - 99 mg/dL    Comment: Glucose reference range applies only to samples taken after fasting for at least  8 hours.  Glucose, capillary     Status: Abnormal   Collection Time: 02/22/21  4:25 PM  Result Value Ref Range   Glucose-Capillary 239 (H) 70 - 99 mg/dL    Comment: Glucose reference range applies only to samples taken after fasting for at least 8 hours.  HIV Antibody (routine testing w rflx)     Status: None   Collection Time: 02/22/21  4:38 PM  Result Value Ref Range   HIV Screen 4th Generation wRfx Non Reactive Non Reactive    Comment: Performed at [facility], [address] [place], [place] [address]  Beta-hydroxybutyric acid     Status: Abnormal   Collection Time: 02/22/21  4:38 PM  Result Value Ref Range   Beta-Hydroxybutyric Acid 1.90 (H) 0.05 - 0.27 mmol/L    Comment: Performed at [facility], [address], [place], [place] [address]  Hemoglobin A1c     Status: Abnormal   Collection Time: 02/22/21  4:38 PM  Result Value Ref Range   Hgb A1c MFr Bld 9.5 (H) 4.8 - 5.6 %    Comment: (NOTE) Pre diabetes:          5.7%-6.4%  Diabetes:              >6.4%  Glycemic control for   <7.0% adults with diabetes    Mean Plasma Glucose 225.95 mg/dL    Comment: Performed at [facility], [address] [place], [place] [address]  Basic metabolic panel     Status: Abnormal   Collection Time: 02/22/21  4:38 PM  Result Value Ref Range   Sodium 145 135 - 145 mmol/L   Potassium 3.9 3.5 - 5.1 mmol/L   Chloride 109 98 - 111 mmol/L   CO2 23 22 - 32 mmol/L   Glucose, Bld 238 (H) 70 - 99 mg/dL    Comment: Glucose reference range applies only to samples taken after fasting for at least 8 hours.   BUN 27 (H) 6 - 20 mg/dL   Creatinine, Ser 5.18 (H) 0.61 - 1.24 mg/dL   Calcium 8.9 8.9 - 84.1 mg/dL   GFR, Estimated 60 (L) >60 mL/min    Comment: (NOTE) Calculated using the CKD-EPI Creatinine Equation (2021)    Anion gap 13 5 - 15    Comment: Performed at [facility], [address], [place], [place] [address]  Glucose, capillary      Status: Abnormal   Collection Time: 02/22/21  4:42 PM  Result Value Ref Range   Glucose-Capillary 214 (H) 70 - 99 mg/dL    Comment: Glucose reference range applies only to samples taken after fasting for at least 8 hours.  Glucose, capillary     Status: Abnormal   Collection Time: 02/22/21  5:52 PM  Result Value Ref Range   Glucose-Capillary 214 (H) 70 - 99 mg/dL    Comment: Glucose reference range applies only to samples taken after fasting for at least 8 hours.  Glucose, capillary     Status: Abnormal   Collection Time: 02/22/21  6:57 PM  Result Value Ref Range   Glucose-Capillary 205 (H) 70 - 99 mg/dL    Comment: Glucose reference range applies only to samples taken after fasting for at least 8 hours.  Beta-hydroxybutyric acid  Status: Abnormal   Collection Time: 02/22/21  7:11 PM  Result Value Ref Range   Beta-Hydroxybutyric Acid 1.92 (H) 0.05 - 0.27 mmol/L    Comment: Performed at [facility], [address], [place], [place] [address]  Basic metabolic panel     Status: Abnormal   Collection Time: 02/22/21  7:11 PM  Result Value Ref Range   Sodium 145 135 - 145 mmol/L   Potassium 3.6 3.5 - 5.1 mmol/L   Chloride 108 98 - 111 mmol/L   CO2 24 22 - 32 mmol/L   Glucose, Bld 213 (H) 70 - 99 mg/dL    Comment: Glucose reference range applies only to samples taken after fasting for at least 8 hours.   BUN 27 (H) 6 - 20 mg/dL   Creatinine, Ser 6.59 0.61 - 1.24 mg/dL   Calcium 8.8 (L) 8.9 - 10.3 mg/dL   GFR, Estimated >93 >57 mL/min    Comment: (NOTE) Calculated using the CKD-EPI Creatinine Equation (2021)    Anion gap 13 5 - 15    Comment: Performed at [facility], [address] [place], [place] [address]  Glucose, capillary     Status: Abnormal   Collection Time: 02/22/21  8:03 PM  Result Value Ref Range   Glucose-Capillary 210 (H) 70 - 99 mg/dL    Comment: Glucose reference range applies only to samples taken after fasting for at  least 8 hours.  Glucose, capillary     Status: Abnormal   Collection Time: 02/22/21  9:05 PM  Result Value Ref Range   Glucose-Capillary 190 (H) 70 - 99 mg/dL    Comment: Glucose reference range applies only to samples taken after fasting for at least 8 hours.  Glucose, capillary     Status: Abnormal   Collection Time: 02/22/21 10:22 PM  Result Value Ref Range   Glucose-Capillary 152 (H) 70 - 99 mg/dL    Comment: Glucose reference range applies only to samples taken after fasting for at least 8 hours.  Glucose, capillary     Status: Abnormal   Collection Time: 02/22/21 11:19 PM  Result Value Ref Range   Glucose-Capillary 181 (H) 70 - 99 mg/dL    Comment: Glucose reference range applies only to samples taken after fasting for at least 8 hours.  Basic metabolic panel     Status: Abnormal   Collection Time: 02/22/21 11:22 PM  Result Value Ref Range   Sodium 145 135 - 145 mmol/L   Potassium 3.7 3.5 - 5.1 mmol/L   Chloride 110 98 - 111 mmol/L   CO2 26 22 - 32 mmol/L   Glucose, Bld 174 (H) 70 - 99 mg/dL    Comment: Glucose reference range applies only to samples taken after fasting for at least 8 hours.   BUN 25 (H) 6 - 20 mg/dL   Creatinine, Ser 3.90 0.61 - 1.24 mg/dL   Calcium 8.6 (L) 8.9 - 10.3 mg/dL   GFR, Estimated >30 >09 mL/min    Comment: (NOTE) Calculated using the CKD-EPI Creatinine Equation (2021)    Anion gap 9 5 - 15    Comment: Performed at [facility], [address], [place], [place] [address]  Beta-hydroxybutyric acid     Status: Abnormal   Collection Time: 02/22/21 11:22 PM  Result Value Ref Range   Beta-Hydroxybutyric Acid 0.40 (H) 0.05 - 0.27 mmol/L    Comment: Performed at [facility], [address] [place], [place] [address]  Glucose, capillary     Status: Abnormal   Collection Time: 02/23/21 12:08 AM  Result Value Ref Range   Glucose-Capillary 161 (H) 70 - 99 mg/dL    Comment: Glucose reference range applies only to  samples taken after fasting for at least 8 hours.  Glucose, capillary     Status: Abnormal   Collection Time: 02/23/21  1:01 AM  Result Value Ref Range   Glucose-Capillary 159 (H) 70 - 99 mg/dL    Comment: Glucose reference range applies only to samples taken after fasting for at least 8 hours.  Glucose, capillary     Status: Abnormal   Collection Time: 02/23/21  2:05 AM  Result Value Ref Range   Glucose-Capillary 160 (H) 70 - 99 mg/dL    Comment: Glucose reference range applies only to samples taken after fasting for at least 8 hours.  Basic metabolic panel     Status: Abnormal   Collection Time: 02/23/21  3:12 AM  Result Value Ref Range   Sodium 147 (H) 135 - 145 mmol/L   Potassium 3.6 3.5 - 5.1 mmol/L   Chloride 110 98 - 111 mmol/L   CO2 27 22 - 32 mmol/L   Glucose, Bld 165 (H) 70 - 99 mg/dL    Comment: Glucose reference range applies only to samples taken after fasting for at least 8 hours.   BUN 25 (H) 6 - 20 mg/dL   Creatinine, Ser 4.54 0.61 - 1.24 mg/dL   Calcium 8.6 (L) 8.9 - 10.3 mg/dL   GFR, Estimated >09 >81 mL/min    Comment: (NOTE) Calculated using the CKD-EPI Creatinine Equation (2021)    Anion gap 10 5 - 15    Comment: Performed at [facility], [address] [place], [place] [address]  Beta-hydroxybutyric acid     Status: Abnormal   Collection Time: 02/23/21  3:12 AM  Result Value Ref Range   Beta-Hydroxybutyric Acid 0.29 (H) 0.05 - 0.27 mmol/L    Comment: Performed at [facility], [address], [place], [place] [address]  Glucose, capillary     Status: Abnormal   Collection Time: 02/23/21  4:07 AM  Result Value Ref Range   Glucose-Capillary 163 (H) 70 - 99 mg/dL    Comment: Glucose reference range applies only to samples taken after fasting for at least 8 hours.  Glucose, capillary     Status: Abnormal   Collection Time: 02/23/21  6:25 AM  Result Value Ref Range   Glucose-Capillary 172 (H) 70 - 99 mg/dL    Comment:  Glucose reference range applies only to samples taken after fasting for at least 8 hours.  Basic metabolic panel     Status: Abnormal   Collection Time: 02/23/21  7:42 AM  Result Value Ref Range   Sodium 146 (H) 135 - 145 mmol/L   Potassium 5.0 3.5 - 5.1 mmol/L    Comment: DELTA CHECK NOTED   Chloride 116 (H) 98 - 111 mmol/L   CO2 20 (L) 22 - 32 mmol/L   Glucose, Bld 165 (H) 70 - 99 mg/dL    Comment: Glucose reference range applies only to samples taken after fasting for at least 8 hours.   BUN 24 (H) 6 - 20 mg/dL   Creatinine, Ser 2.13 (H) 0.61 - 1.24 mg/dL   Calcium 8.6 (L) 8.9 - 10.3 mg/dL   GFR, Estimated >08 >65 mL/min    Comment: (NOTE) Calculated using the CKD-EPI Creatinine Equation (2021)  Anion gap 10 5 - 15    Comment: Performed at [facility], [address], [place], [place] [address]  Glucose, capillary     Status: Abnormal   Collection Time: 02/23/21  8:35 AM  Result Value Ref Range   Glucose-Capillary 147 (H) 70 - 99 mg/dL    Comment: Glucose reference range applies only to samples taken after fasting for at least 8 hours.  Beta-hydroxybutyric acid     Status: Abnormal   Collection Time: 02/23/21  9:56 AM  Result Value Ref Range   Beta-Hydroxybutyric Acid 0.36 (H) 0.05 - 0.27 mmol/L    Comment: Performed at [facility], [address], [place], [place] [address]  CBC     Status: Abnormal   Collection Time: 02/23/21  9:56 AM  Result Value Ref Range   WBC 12.6 (H) 4.0 - 10.5 K/uL   RBC 3.73 (L) 4.22 - 5.81 MIL/uL   Hemoglobin 10.9 (L) 13.0 - 17.0 g/dL   HCT 09.8 (L) 11.9 - 14.7 %   MCV 89.3 80.0 - 100.0 fL   MCH 29.2 26.0 - 34.0 pg   MCHC 32.7 30.0 - 36.0 g/dL   RDW 82.9 56.2 - 13.0 %   Platelets 266 150 - 400 K/uL   nRBC 0.0 0.0 - 0.2 %    Comment: Performed at [facility], [address], [place], [place] [address]  Vitamin B12     Status: Abnormal   Collection Time: 02/23/21  9:56 AM  Result  Value Ref Range   Vitamin B-12 158 (L) 180 - 914 pg/mL    Comment: (NOTE) This assay is not validated for testing neonatal or myeloproliferative syndrome specimens for Vitamin B12 levels. Performed at [facility], [address] [place], [place] [address]   Folate, serum, performed at [facility] lab     Status: None   Collection Time: 02/23/21  9:56 AM  Result Value Ref Range   Folate 7.8 >5.9 ng/mL    Comment: Performed at [facility], [address] [place], [place] [address]  Glucose, capillary     Status: Abnormal   Collection Time: 02/23/21 10:31 AM  Result Value Ref Range   Glucose-Capillary 156 (H) 70 - 99 mg/dL    Comment: Glucose reference range applies only to samples taken after fasting for at least 8 hours.  Glucose, capillary     Status: Abnormal   Collection Time: 02/23/21 12:26 PM  Result Value Ref Range   Glucose-Capillary 153 (H) 70 - 99 mg/dL    Comment: Glucose reference range applies only to samples taken after fasting for at least 8 hours.  Basic metabolic panel     Status: Abnormal   Collection Time: 02/23/21  2:19 PM  Result Value Ref Range   Sodium 145 135 - 145 mmol/L   Potassium 3.6 3.5 - 5.1 mmol/L    Comment: DELTA CHECK NOTED   Chloride 110 98 - 111 mmol/L   CO2 25 22 - 32 mmol/L   Glucose, Bld 177 (H) 70 - 99 mg/dL    Comment: Glucose reference range applies only to samples taken after fasting for at least 8 hours.   BUN 20 6 - 20 mg/dL   Creatinine, Ser 1.32 0.61 - 1.24 mg/dL   Calcium 8.6 (L) 8.9 - 10.3 mg/dL   GFR, Estimated >44 >01 mL/min    Comment: (NOTE) Calculated using the CKD-EPI Creatinine Equation (2021)    Anion  gap 10 5 - 15    Comment: Performed at [facility], [address] [place], [place] [address]  Glucose, capillary     Status: Abnormal   Collection Time: 02/23/21  2:29 PM  Result Value Ref Range   Glucose-Capillary 196 (H) 70 - 99 mg/dL    Comment: Glucose  reference range applies only to samples taken after fasting for at least 8 hours.  Glucose, capillary     Status: Abnormal   Collection Time: 02/23/21  4:56 PM  Result Value Ref Range   Glucose-Capillary 178 (H) 70 - 99 mg/dL    Comment: Glucose reference range applies only to samples taken after fasting for at least 8 hours.  Glucose, capillary     Status: Abnormal   Collection Time: 02/23/21  7:46 PM  Result Value Ref Range   Glucose-Capillary 251 (H) 70 - 99 mg/dL    Comment: Glucose reference range applies only to samples taken after fasting for at least 8 hours.    CT ABDOMEN PELVIS W CONTRAST  Result Date: 02/23/2021 CLINICAL DATA:  Abdominal pain with nausea and vomiting EXAM: CT ABDOMEN AND PELVIS WITH CONTRAST TECHNIQUE: Multidetector CT imaging of the abdomen and pelvis was performed using the standard protocol following bolus administration of intravenous contrast. CONTRAST:  80mL OMNIPAQUE IOHEXOL 350 MG/ML SOLN COMPARISON:  05/04/2017 FINDINGS: Lower chest: Mild emphysematous changes are noted. No focal infiltrate or sizable effusion is seen. Hepatobiliary: Fatty infiltration of the liver is noted. The gallbladder is well distended. Dependent density is seen which may be related to gallbladder sludge or small stones. Pancreas: Unremarkable. No pancreatic ductal dilatation or surrounding inflammatory changes. Spleen: Normal in size without focal abnormality. Adrenals/Urinary Tract: Adrenal glands are within normal limits. Kidneys are well visualized bilaterally. Normal enhancement pattern is seen bilaterally. No renal calculi or obstructive changes are noted. Delayed images demonstrate normal excretion of contrast material. Ureters are within normal limits. Bladder is well distended. Stomach/Bowel: Colon is well visualized without obstructive or inflammatory changes. Mild retained fecal material is noted within the right colon consistent with a degree of constipation. The appendix is not  well visualized and may have been surgically removed. Small bowel shows no obstructive changes. The stomach is within normal limits. Vascular/Lymphatic: Aortic atherosclerosis. Few small reactive lymph nodes are noted in the right iliac chain. Reproductive: Prostate is unremarkable. Other: There is subcutaneous inflammatory change in the region of the perineum extending into both buttocks superiorly slightly greater on the left than the right. A small focal subcutaneous fluid collection is noted which measures 2.7 x 2.2 cm best noted on image number 12 of series 4 in the medial aspect of the right buttock along the intergluteal cleft. The subcutaneous air extends superiorly adjacent to the right inguinal canal. No other definitive fluid collection is seen. These changes are most consistent with Fournier's gangrene. Additionally some inflammatory change extends along the right lateral pelvic wall posteriorly with some reactive lymphadenopathy identified. No free fluid is noted. Musculoskeletal: No acute bony abnormality is noted. IMPRESSION: Changes in the subcutaneous tissues of the perineum/bilateral buttocks and extending into the right lateral pelvic wall consistent with Fournier's gangrene. Some thickening of the operator internus muscle on the right is noted. A small subcutaneous fluid collection is noted in the medial aspect of the right buttocks. Mild changes of colonic constipation in the right colon. Dependent density in the gallbladder likely representing sludge or small gallstones. Mild emphysematous changes. These results will be called to  the ordering clinician or representative by the Radiologist Assistant, and communication documented in the PACS or [employer]. Electronically Signed   By: Alcide Clever M.D.   On: 02/23/2021 20:01    Review of Systems  Constitutional:  Positive for appetite change.  HENT: Negative.    Eyes: Negative.   Respiratory: Negative.    Cardiovascular: Negative.    Gastrointestinal:  Positive for abdominal pain, nausea, rectal pain and vomiting.  Endocrine: Negative.   Genitourinary: Negative.   Musculoskeletal: Negative.   Skin: Negative.   Allergic/Immunologic: Negative.   Neurological: Negative.   Hematological: Negative.   Psychiatric/Behavioral: Negative.     Physical Exam  Blood pressure (!) 192/100, pulse (!) 122, temperature (!) 102 F (38.9 C), temperature source Oral, resp. rate (!) 23, height  (1.803 m), weight 101.7 kg, SpO2 98 %.  CONSTITUTIONAL: no acute distress; conversant; no obvious deformities  EYES: conjunctiva moist; no lid lag; anicteric; pupils equal bilaterally  NECK: trachea midline; no thyroid nodularity  LUNGS: respiratory effort normal & unlabored; no wheeze; no rales; no tactile fremitus  CV: Mild tachycardia, rhythm regular; no palpable thrills; no murmur; no edema bilat lower extremities  GI: abdomen soft, quiet, mild distension; no hepatosplenomegaly; no obvious hernia  GU: In the perianal area there is induration in the medial right buttock with an area of fluctuance and a small amount of foul-smelling drainage.  There is no obvious cellulitis.  There is minimal tenderness on the left side.  Scrotum appears normal and without tenderness.  MSK: normal range of motion of extremities; no clubbing; no cyanosis  PSYCH: appropriate affect for situation; alert and oriented to person, place, & time  LYMPHATIC: no palpable cervical lymphadenopathy; no evidence lymphedema in extremities    Assessment/Plan:  Fournier's gangrene of perineum Diabetes mellitus, resolving DKA   IV abx's per medical service - clindamycin, Zosyn, Vancomycin ordered  NPO, prepare for OR  Plan operative incision, drainage, and open packing  I discussed the above findings with the patient.  I explained the procedure with incision, drainage, and open packing in order to try to control the infection.  The patient  understands and agrees to proceed.  The operating room is currently busy with other emergent procedures.  This will probably take several hours.  We will hold Lovenox on this patient.  We will await administration of intravenous antibiotics.  The plan will be to take him to the operating room for incision, drainage, and open packing the first case tomorrow morning, February 24, 2021.  This is discussed with the patient and the operating room.  The risks and benefits of the procedure have been discussed at length with the patient.  The patient understands the proposed procedure, potential alternative treatments, and the course of recovery to be expected.  All of the patient's questions have been answered at this time.  The patient wishes to proceed with surgery.  Darnell Level, MD [facility] Office: [phone]   Darnell Level 02/23/2021, 10:31 PM

## 2021-02-23 NOTE — Progress Notes (Signed)
eLink Physician-Brief Progress Note Patient Name: Andres Richardson DOB: 1965/11/01 MRN: [id number]   Date of Service  02/23/2021  HPI/Events of Note  RN informs me that [facility] is advising transition to [place] insulin. From a glycemic standpoint, the patient is ready to transition. However, clinically the patient is still having ongoing nausea/vomiting.   eICU Interventions  Continue insulin infusion w/ dextrose fluids until patient can tolerate PO / nausea and vomiting resolve.     Intervention Category Major Interventions: Hyperglycemia - active titration of insulin therapy  Marveen Reeks Edmonia Gonser 02/23/2021, 1:23 AM

## 2021-02-23 NOTE — Progress Notes (Addendum)
Inpatient Diabetes Program Recommendations  AACE/ADA: New Consensus Statement on Inpatient Glycemic Control (2015)  Target Ranges:  Prepandial:   less than 140 mg/dL      Peak postprandial:   less than 180 mg/dL (1-2 hours)      Critically ill patients:  140 - 180 mg/dL   Lab Results  Component Value Date   GLUCAP 172 (H) 02/23/2021   HGBA1C 9.5 (H) 02/22/2021    Review of Glycemic Control Results for Andres Richardson, Andres Richardson (MRN [id number]) as of 02/23/2021 08:25  Ref. Range 02/23/2021 00:08 02/23/2021 01:01 02/23/2021 02:05 02/23/2021 04:07 02/23/2021 06:25  Glucose-Capillary Latest Ref Range: 70 - 99 mg/dL 497 (H) 026 (H) 378 (H) 163 (H) 172 (H)   Diabetes history: DM 2 Outpatient Diabetes medications:  Glucotrol XL 5 mg daily, Metformin 750 mg bid, Novolin 70/30 20 units q HS Current orders for Inpatient glycemic control:  IV insulin Inpatient Diabetes Program Recommendations:    Please consider adding Levemir 10 units bid (start Levemir 2 hours prior to stopping IV insulin) and Novolog moderate tid with meals and HS scale. Will follow-up with patient when appropriate.  Thanks,  Beryl Meager, RN, BC-ADM Inpatient Diabetes Coordinator Pager [phone]  (8a-5p)

## 2021-02-23 NOTE — Progress Notes (Signed)
Pharmacy Antibiotic Note  Andres Richardson is a 55 y.o. male admitted on 02/22/2021 with DKA found to have fournier's gangrene  Pharmacy has been consulted to dose vancomycin and zosyn.  Plan: Vancomycin 2gm IV x 1 then 1gm q12h (AUC 540.9, Scr 1.08) Zosyn 3.375g IV Q8H infused over 4hrs. Clinda per MD Daily Scr x 3  Height: 5\' 11"  (180.3 cm) Weight: 101.7 kg (224 lb 3.3 oz) IBW/kg (Calculated) : 75.3  Temp (24hrs), Avg:100.4 F (38 C), Min:99.3 F (37.4 C), Max:102 F (38.9 C)  Recent Labs  Lab 02/22/21 1118 02/22/21 1155 02/22/21 1911 02/22/21 2322 02/23/21 0312 02/23/21 0742 02/23/21 0956 02/23/21 1419  WBC 13.2*  --   --   --   --   --  12.6*  --   CREATININE 1.41*   < > 1.21 1.21 1.14 1.34*  --  1.08   < > = values in this interval not displayed.    Estimated Creatinine Clearance: 93.9 mL/min (by C-G formula based on SCr of 1.08 mg/dL).    Allergies  Allergen Reactions   Adhesive [Tape] Rash    Antimicrobials this admission: 7/11 vanc >> 7/12 zosyn >> 7/12 clinda >>  Dose adjustments this admission:   Microbiology results: 7/11 BCA: 7/11 MRSA PCR: neg  Thank you for allowing pharmacy to be a part of this patient's care.  9/11 RPh 02/23/2021, 10:14 PM
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
## 2021-02-23 NOTE — Progress Notes (Signed)
Night Coverage   Called from daytime provider with results from CT abd/pelvis.  Notable for changes in subcutaneous tissues of perineum/bilateral buttocks and extending into R lateral pelvic wall c/w fournier's gangrene.    Vitals reviewed Today's Vitals   02/23/21 1700 02/23/21 1800 02/23/21 1900 02/23/21 2000  BP: (!) 172/84 (!) 109/58    Pulse: (!) 119 96    Resp: 18 17    Temp:   100 F (37.8 C)   TempSrc:   Oral   SpO2: 98% 97%    Weight:      Height:      PainSc:    0-No pain   Body mass index is 31.27 kg/m.  Tachycardia improved from earlier in day.  Last temp, 100 degrees F.  Last BP appropriate.  AP 55 yo hx T2DM who presented with DKA, nausea/vomiting, now found to have Fournier's gangrene.  Discussed with surgery on call, Dr. Gerrit Friends, they'll see. Broad spectrum abx - vanc/zosyn + clinda.  Blood cultures Trend vitals.  Discussed pt with RN.

## 2021-02-23 NOTE — Progress Notes (Signed)
eLink Physician-Brief Progress Note Patient Name: EGE MUCKEY DOB: [birth year] MRN: [id number]   Date of Service  02/23/2021  HPI/Events of Note  Ongoing nausea/vomiting despite Zofran.  eICU Interventions  Ordered compazine PRN.     Intervention Category Minor Interventions: Other:  Janae Bridgeman 02/23/2021, 5:18 AM

## 2021-02-24 ENCOUNTER — Encounter (HOSPITAL_COMMUNITY)
Admission: EM | Disposition: A | Discharge status: Home Health | Payer: Self-pay | Source: Home / Self Care | Attending: Internal Medicine

## 2021-02-24 ENCOUNTER — Inpatient Hospital Stay (HOSPITAL_COMMUNITY): Payer: Self-pay | Admitting: Certified Registered Nurse Anesthetist

## 2021-02-24 ENCOUNTER — Encounter (HOSPITAL_COMMUNITY): Payer: Self-pay | Admitting: Internal Medicine

## 2021-02-24 HISTORY — PX: INCISION AND DRAINAGE ABSCESS: SHX5864

## 2021-02-24 LAB — BASIC METABOLIC PANEL
Anion gap: 13 (ref 5–15)
BUN: 18 mg/dL (ref 6–20)
CO2: 22 mmol/L (ref 22–32)
Calcium: 8.1 mg/dL — ABNORMAL LOW (ref 8.9–10.3)
Chloride: 109 mmol/L (ref 98–111)
Creatinine, Ser: 1.1 mg/dL (ref 0.61–1.24)
GFR, Estimated: >60 mL/min (ref >60)
Glucose, Bld: 197 mg/dL — ABNORMAL HIGH (ref 70–99)
Potassium: 3.5 mmol/L (ref 3.5–5.1)
Sodium: 144 mmol/L (ref 135–145)

## 2021-02-24 LAB — GLUCOSE, CAPILLARY
Glucose-Capillary: 196 mg/dL — ABNORMAL HIGH (ref 70–99)
Glucose-Capillary: 221 mg/dL — ABNORMAL HIGH (ref 70–99)
Glucose-Capillary: 222 mg/dL — ABNORMAL HIGH (ref 70–99)
Glucose-Capillary: 227 mg/dL — ABNORMAL HIGH (ref 70–99)
Glucose-Capillary: 249 mg/dL — ABNORMAL HIGH (ref 70–99)
Glucose-Capillary: 269 mg/dL — ABNORMAL HIGH (ref 70–99)

## 2021-02-24 LAB — CBC WITH DIFFERENTIAL/PLATELET
Abs Immature Granulocytes: 0.13 10*3/uL — ABNORMAL HIGH (ref 0.00–0.07)
Basophils Absolute: 0 10*3/uL (ref 0.0–0.1)
Basophils Relative: 0 %
Eosinophils Absolute: 0 10*3/uL (ref 0.0–0.5)
Eosinophils Relative: 0 %
HCT: 37.7 % — ABNORMAL LOW (ref 39.0–52.0)
Hemoglobin: 11.8 g/dL — ABNORMAL LOW (ref 13.0–17.0)
Immature Granulocytes: 1 %
Lymphocytes Relative: 6 %
Lymphs Abs: 0.7 10*3/uL (ref 0.7–4.0)
MCH: 29 pg (ref 26.0–34.0)
MCHC: 31.3 g/dL (ref 30.0–36.0)
MCV: 92.6 fL (ref 80.0–100.0)
Monocytes Absolute: 1.3 10*3/uL — ABNORMAL HIGH (ref 0.1–1.0)
Monocytes Relative: 11 %
Neutro Abs: 10 10*3/uL — ABNORMAL HIGH (ref 1.7–7.7)
Neutrophils Relative %: 82 %
Platelets: 256 10*3/uL (ref 150–400)
RBC: 4.07 MIL/uL — ABNORMAL LOW (ref 4.22–5.81)
RDW: 15 % (ref 11.5–15.5)
WBC: 12.2 10*3/uL — ABNORMAL HIGH (ref 4.0–10.5)
nRBC: 0 % (ref 0.0–0.2)

## 2021-02-24 LAB — CREATININE, SERUM
Creatinine, Ser: 1.09 mg/dL (ref 0.61–1.24)
GFR, Estimated: >60 mL/min (ref >60)

## 2021-02-24 SURGERY — INCISION AND DRAINAGE, ABSCESS
Anesthesia: General

## 2021-02-24 MED ORDER — LACTATED RINGERS IV SOLN: INTRAVENOUS | Status: DC

## 2021-02-24 MED ORDER — ROCURONIUM BROMIDE 100 MG/10ML IV SOLN
INTRAVENOUS | Status: DC | PRN
  Administered 2021-02-24: 60 mg via INTRAVENOUS

## 2021-02-24 MED ORDER — FENTANYL CITRATE (PF) 100 MCG/2ML IJ SOLN: 25.0000 ug | INTRAMUSCULAR | Status: DC | PRN

## 2021-02-24 MED ORDER — ONDANSETRON HCL 4 MG/2ML IJ SOLN
INTRAMUSCULAR | Status: DC | PRN
  Administered 2021-02-24: 4 mg via INTRAVENOUS

## 2021-02-24 MED ORDER — PROPOFOL 10 MG/ML IV BOLUS
INTRAVENOUS | Status: AC
  Filled 2021-02-24: qty 40

## 2021-02-24 MED ORDER — LIDOCAINE HCL (CARDIAC) PF 100 MG/5ML IV SOSY
PREFILLED_SYRINGE | INTRAVENOUS | Status: DC | PRN
  Administered 2021-02-24: 100 mg via INTRAVENOUS

## 2021-02-24 MED ORDER — INSULIN DETEMIR 100 UNIT/ML ~~LOC~~ SOLN
12.0000 [IU] | Freq: Two times a day (BID) | SUBCUTANEOUS | Status: DC
  Administered 2021-02-24 – 2021-03-06 (×21): 12 [IU] via SUBCUTANEOUS
  Filled 2021-02-24 (×22): qty 0.12

## 2021-02-24 MED ORDER — ROCURONIUM BROMIDE 10 MG/ML (PF) SYRINGE
PREFILLED_SYRINGE | INTRAVENOUS | Status: AC
  Filled 2021-02-24: qty 10

## 2021-02-24 MED ORDER — DEXAMETHASONE SODIUM PHOSPHATE 10 MG/ML IJ SOLN
INTRAMUSCULAR | Status: AC
  Filled 2021-02-24: qty 1

## 2021-02-24 MED ORDER — OXYCODONE HCL 5 MG PO TABS: 5.0000 mg | ORAL_TABLET | Freq: Once | ORAL | Status: DC | PRN

## 2021-02-24 MED ORDER — ACETAMINOPHEN 500 MG PO TABS: 1000.0000 mg | ORAL_TABLET | Freq: Once | ORAL | Status: DC | PRN

## 2021-02-24 MED ORDER — ACETAMINOPHEN 160 MG/5ML PO SOLN: 1000.0000 mg | Freq: Once | ORAL | Status: DC | PRN

## 2021-02-24 MED ORDER — MIDAZOLAM HCL 2 MG/2ML IJ SOLN
INTRAMUSCULAR | Status: AC
  Filled 2021-02-24: qty 2

## 2021-02-24 MED ORDER — DEXAMETHASONE SODIUM PHOSPHATE 10 MG/ML IJ SOLN
INTRAMUSCULAR | Status: DC | PRN
  Administered 2021-02-24: 5 mg via INTRAVENOUS

## 2021-02-24 MED ORDER — PROPOFOL 10 MG/ML IV BOLUS
INTRAVENOUS | Status: DC | PRN
  Administered 2021-02-24: 150 mg via INTRAVENOUS

## 2021-02-24 MED ORDER — SODIUM CHLORIDE 0.45 % IV SOLN: INTRAVENOUS | Status: DC

## 2021-02-24 MED ORDER — PHENYLEPHRINE 40 MCG/ML (10ML) SYRINGE FOR IV PUSH (FOR BLOOD PRESSURE SUPPORT)
PREFILLED_SYRINGE | INTRAVENOUS | Status: AC
  Filled 2021-02-24: qty 10

## 2021-02-24 MED ORDER — LIDOCAINE 2% (20 MG/ML) 5 ML SYRINGE
INTRAMUSCULAR | Status: AC
  Filled 2021-02-24: qty 5

## 2021-02-24 MED ORDER — OXYCODONE HCL 5 MG/5ML PO SOLN: 5.0000 mg | Freq: Once | ORAL | Status: DC | PRN

## 2021-02-24 MED ORDER — PHENYLEPHRINE HCL (PRESSORS) 10 MG/ML IV SOLN
INTRAVENOUS | Status: AC
  Filled 2021-02-24: qty 1

## 2021-02-24 MED ORDER — ACETAMINOPHEN 10 MG/ML IV SOLN: 1000.0000 mg | Freq: Once | INTRAVENOUS | Status: DC | PRN

## 2021-02-24 MED ORDER — SODIUM CHLORIDE 0.9 % IV SOLN
INTRAVENOUS | Status: DC | PRN
  Administered 2021-02-24: 30 ug/min via INTRAVENOUS

## 2021-02-24 MED ORDER — PHENYLEPHRINE HCL (PRESSORS) 10 MG/ML IV SOLN
INTRAVENOUS | Status: DC | PRN
  Administered 2021-02-24 (×3): 80 ug via INTRAVENOUS

## 2021-02-24 MED ORDER — SODIUM CHLORIDE 0.9 % IR SOLN
Status: DC | PRN
  Administered 2021-02-24: 1000 mL

## 2021-02-24 MED ORDER — CHLORHEXIDINE GLUCONATE 0.12 % MT SOLN
15.0000 mL | OROMUCOSAL | Status: AC
  Administered 2021-02-24: 15 mL via OROMUCOSAL

## 2021-02-24 MED ORDER — ONDANSETRON HCL 4 MG/2ML IJ SOLN
INTRAMUSCULAR | Status: AC
  Filled 2021-02-24: qty 2

## 2021-02-24 MED ORDER — FENTANYL CITRATE (PF) 100 MCG/2ML IJ SOLN
INTRAMUSCULAR | Status: AC
  Filled 2021-02-24: qty 2

## 2021-02-24 MED ORDER — MIDAZOLAM HCL 5 MG/5ML IJ SOLN
INTRAMUSCULAR | Status: DC | PRN
  Administered 2021-02-24: 2 mg via INTRAVENOUS

## 2021-02-24 MED ORDER — FENTANYL CITRATE (PF) 100 MCG/2ML IJ SOLN
INTRAMUSCULAR | Status: DC | PRN
  Administered 2021-02-24: 50 ug via INTRAVENOUS

## 2021-02-24 MED ORDER — SUGAMMADEX SODIUM 200 MG/2ML IV SOLN
INTRAVENOUS | Status: DC | PRN
  Administered 2021-02-24: 200 mg via INTRAVENOUS

## 2021-02-24 SURGICAL SUPPLY — 30 items
BAG COUNTER SPONGE SURGICOUNT (BAG) IMPLANT
BLADE SURG SZ11 CARB STEEL (BLADE) ×2 IMPLANT
BNDG GAUZE ELAST 4 BULKY (GAUZE/BANDAGES/DRESSINGS) ×2 IMPLANT
CHLORAPREP W/TINT 26 (MISCELLANEOUS) IMPLANT
COVER SURGICAL LIGHT HANDLE (MISCELLANEOUS) ×2 IMPLANT
DECANTER SPIKE VIAL GLASS SM (MISCELLANEOUS) IMPLANT
DRAPE LAPAROSCOPIC ABDOMINAL (DRAPES) IMPLANT
DRAPE LAPAROTOMY TRNSV 102X78 (DRAPES) IMPLANT
DRSG PAD ABDOMINAL 8X10 ST (GAUZE/BANDAGES/DRESSINGS) ×4 IMPLANT
ELECT REM PT RETURN 15FT ADLT (MISCELLANEOUS) ×2 IMPLANT
GAUZE SPONGE 4X4 12PLY STRL (GAUZE/BANDAGES/DRESSINGS) IMPLANT
GLOVE SURG POLYISO LF SZ7 (GLOVE) ×2 IMPLANT
GLOVE SURG UNDER POLY LF SZ7 (GLOVE) ×2 IMPLANT
GOWN STRL REUS W/TWL LRG LVL3 (GOWN DISPOSABLE) ×2 IMPLANT
GOWN STRL REUS W/TWL XL LVL3 (GOWN DISPOSABLE) ×2 IMPLANT
KIT BASIN OR (CUSTOM PROCEDURE TRAY) ×4 IMPLANT
KIT TURNOVER KIT A (KITS) ×2 IMPLANT
PACK GENERAL/GYN (CUSTOM PROCEDURE TRAY) ×2 IMPLANT
PACK LITHOTOMY IV (CUSTOM PROCEDURE TRAY) ×2 IMPLANT
PENCIL SMOKE EVACUATOR (MISCELLANEOUS) IMPLANT
SPONGE T-LAP 18X18 ~~LOC~~+RFID (SPONGE) ×4 IMPLANT
SURGILUBE 2OZ TUBE FLIPTOP (MISCELLANEOUS) ×2 IMPLANT
SUT MNCRL AB 4-0 PS2 18 (SUTURE) IMPLANT
SWAB COLLECTION DEVICE MRSA (MISCELLANEOUS) ×2 IMPLANT
SWAB CULTURE ESWAB REG 1ML (MISCELLANEOUS) ×2 IMPLANT
TAPE CLOTH SURG 6X10 WHT LF (GAUZE/BANDAGES/DRESSINGS) ×2 IMPLANT
TOWEL OR 17X26 10 PK STRL BLUE (TOWEL DISPOSABLE) ×2 IMPLANT
TOWEL OR NON WOVEN STRL DISP B (DISPOSABLE) ×2 IMPLANT
TRAY FOLEY MTR SLVR 16FR STAT (SET/KITS/TRAYS/PACK) ×2 IMPLANT
TRAY PREP A LATEX SAFE STRL (SET/KITS/TRAYS/PACK) ×2 IMPLANT

## 2021-02-24 NOTE — Progress Notes (Signed)
Pre Procedure note for inpatients:   Andres Richardson has been scheduled for Procedure(s): INCISION AND DRAINAGE PERINEUM (N/A) today. The various methods of treatment have been discussed with the patient. After consideration of the risks, benefits and treatment options the patient has consented to the planned procedure.   The patient has been seen and labs reviewed. There are no changes in the patient's condition to prevent proceeding with the planned procedure today.  Recent labs:  Lab Results  Component Value Date   WBC 12.2 (H) 02/24/2021   HGB 11.8 (L) 02/24/2021   HCT 37.7 (L) 02/24/2021   PLT 256 02/24/2021   GLUCOSE 197 (H) 02/24/2021   ALT 10 02/22/2021   AST 10 (L) 02/22/2021   NA 144 02/24/2021   K 3.5 02/24/2021   CL 109 02/24/2021   CREATININE 1.10 02/24/2021   BUN 18 02/24/2021   CO2 22 02/24/2021   HGBA1C 9.5 (H) 02/22/2021    [name], MD 02/24/2021 7:24 AM

## 2021-02-24 NOTE — Consult Note (Signed)
H&P Physician requesting consult: Feliciana RossettiLuke Kinsinger  Chief Complaint: Fournier gangrene  History of Present Illness: 55 year old male admitted with diabetic ketoacidosis complained of abdominal pain.  He underwent CT scan of the abdomen and pelvis that demonstrating findings consistent with perirectal abscess and Fournier's gangrene.  There was tracking up the perineum and right inguinal area.  There was some air close to the bulbous urethra and corpora there.  I was asked to assess and be available for surgery in the operating room.  Past Medical History:  Diagnosis Date   Diabetes mellitus without complication (HCC)    Type !! 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 Diabetic neuropathy.   Hyperlipidemia    Mixed   Hypertension    Essential   Microalbuminuria    Obesity    Peripheral arterial disease (HCC)    With Claudication   Tobacco use disorder    History reviewed. No pertinent surgical history.  Home Medications:  Medications Prior to Admission  Medication Sig Dispense Refill Last Dose   glipiZIDE (GLUCOTROL XL) 5 MG 24 hr tablet Take 5 mg by mouth daily.   02/21/2021   metFORMIN (GLUCOPHAGE-XR) 750 MG 24 hr tablet Take 750 mg by mouth 2 (two) times daily.   02/21/2021   NOVOLIN 70/30 RELION (70-30) 100 UNIT/ML injection Inject 20 Units into the skin at bedtime.   02/21/2021   Insulin Syringe-Needle U-100 (INSULIN SYRINGE .3CC/31GX5/16") 31G X 5/16" 0.3 ML MISC Levemir 15 units twice a day (Patient not taking: Reported on 02/22/2021) 100 each 0 Not Taking   ondansetron (ZOFRAN) 4 MG tablet Take 1 tablet (4 mg total) by mouth every 6 (six) hours as needed for nausea or vomiting. (Patient not taking: Reported on 02/22/2021) 20 tablet 0 Not Taking   pantoprazole (PROTONIX) 40 MG tablet Take 1 tablet (40 mg total) by mouth daily. (Patient not taking: Reported on 02/22/2021) 30 tablet 0 Not Taking   phenol (CHLORASEPTIC) 1.4 % LIQD Use as directed 1 spray in the mouth or throat as needed  for throat irritation / pain. (Patient not taking: Reported on 02/22/2021) 29 mL 0 Not Taking   Allergies:  Allergies  Allergen Reactions   Adhesive [Tape] Rash    History reviewed. No pertinent family history. Social History:  reports that he has been smoking cigarettes. He has been smoking an average of 1.00 packs per day. He has never used smokeless tobacco. He reports that he does not drink alcohol and does not use drugs.  ROS: A complete review of systems was performed.  All systems are negative except for pertinent findings as noted. ROS   Physical Exam:  Vital signs in last 24 hours: Temp:  [97.9 F (36.6 C)-102 F (38.9 C)] 99.5 F (37.5 C) (07/12 1526) Pulse Rate:  [105-124] 108 (07/12 1800) Resp:  [20-29] 26 (07/12 1800) BP: (105-192)/(60-100) 107/60 (07/12 1800) SpO2:  [90 %-99 %] 97 % (07/12 1800) Weight:  [101.7 kg] 101.7 kg (07/12 0751) General:  Alert and oriented, No acute distress HEENT: Normocephalic, atraumatic Neck: No JVD or lymphadenopathy Cardiovascular: Regular rate and rhythm Lungs: Regular rate and effort Abdomen: Soft, nontender, nondistended, no abdominal masses Back: No CVA tenderness Genitourinary: Patient has normal phallus.  There is no crepitus or fluctuance.  Scrotum itself was normal.  There was some erythema but no crepitus.  No fluctuance.  No evidence of scrotal or penile involvement. Extremities: No edema Neurologic: Grossly intact  Laboratory Data:  Results for orders placed or performed during the hospital encounter  of 02/22/21 (from the past 24 hour(s))  Glucose, capillary     Status: Abnormal   Collection Time: 02/23/21  7:46 PM  Result Value Ref Range   Glucose-Capillary 251 (H) 70 - 99 mg/dL  Glucose, capillary     Status: Abnormal   Collection Time: 02/23/21 11:33 PM  Result Value Ref Range   Glucose-Capillary 213 (H) 70 - 99 mg/dL  Creatinine, serum     Status: None   Collection Time: 02/24/21  2:59 AM  Result Value Ref  Range   Creatinine, Ser 1.09 0.61 - 1.24 mg/dL   GFR, Estimated >23 >53 mL/min  Glucose, capillary     Status: Abnormal   Collection Time: 02/24/21  4:18 AM  Result Value Ref Range   Glucose-Capillary 221 (H) 70 - 99 mg/dL  Basic metabolic panel     Status: Abnormal   Collection Time: 02/24/21  6:36 AM  Result Value Ref Range   Sodium 144 135 - 145 mmol/L   Potassium 3.5 3.5 - 5.1 mmol/L   Chloride 109 98 - 111 mmol/L   CO2 22 22 - 32 mmol/L   Glucose, Bld 197 (H) 70 - 99 mg/dL   BUN 18 6 - 20 mg/dL   Creatinine, Ser 6.14 0.61 - 1.24 mg/dL   Calcium 8.1 (L) 8.9 - 10.3 mg/dL   GFR, Estimated >43 >15 mL/min   Anion gap 13 5 - 15  CBC with Differential/Platelet     Status: Abnormal   Collection Time: 02/24/21  6:36 AM  Result Value Ref Range   WBC 12.2 (H) 4.0 - 10.5 K/uL   RBC 4.07 (L) 4.22 - 5.81 MIL/uL   Hemoglobin 11.8 (L) 13.0 - 17.0 g/dL   HCT 40.0 (L) 86.7 - 61.9 %   MCV 92.6 80.0 - 100.0 fL   MCH 29.0 26.0 - 34.0 pg   MCHC 31.3 30.0 - 36.0 g/dL   RDW 50.9 32.6 - 71.2 %   Platelets 256 150 - 400 K/uL   nRBC 0.0 0.0 - 0.2 %   Neutrophils Relative % 82 %   Neutro Abs 10.0 (H) 1.7 - 7.7 K/uL   Lymphocytes Relative 6 %   Lymphs Abs 0.7 0.7 - 4.0 K/uL   Monocytes Relative 11 %   Monocytes Absolute 1.3 (H) 0.1 - 1.0 K/uL   Eosinophils Relative 0 %   Eosinophils Absolute 0.0 0.0 - 0.5 K/uL   Basophils Relative 0 %   Basophils Absolute 0.0 0.0 - 0.1 K/uL   Immature Granulocytes 1 %   Abs Immature Granulocytes 0.13 (H) 0.00 - 0.07 K/uL  Glucose, capillary     Status: Abnormal   Collection Time: 02/24/21  7:40 AM  Result Value Ref Range   Glucose-Capillary 227 (H) 70 - 99 mg/dL  Glucose, capillary     Status: Abnormal   Collection Time: 02/24/21  9:55 AM  Result Value Ref Range   Glucose-Capillary 222 (H) 70 - 99 mg/dL   Comment 1 Notify RN    Comment 2 Document in Chart   Glucose, capillary     Status: Abnormal   Collection Time: 02/24/21  3:13 PM  Result Value Ref  Range   Glucose-Capillary 269 (H) 70 - 99 mg/dL   Comment 1 Notify RN    Comment 2 Document in Chart    Recent Results (from the past 240 hour(s))  Resp Panel by RT-PCR (Flu A&B, Covid) Nasopharyngeal Swab     Status: None   Collection Time: 02/22/21  1:25 PM   Specimen: Nasopharyngeal Swab; Nasopharyngeal(NP) swabs in vial transport medium  Result Value Ref Range Status   SARS Coronavirus 2 by RT PCR NEGATIVE NEGATIVE Final    Comment: (NOTE) SARS-CoV-2 target nucleic acids are NOT DETECTED.  The SARS-CoV-2 RNA is generally detectable in upper respiratory specimens during the acute phase of infection. The lowest concentration of SARS-CoV-2 viral copies this assay can detect is 138 copies/mL. A negative result does not preclude SARS-Cov-2 infection and should not be used as the sole basis for treatment or other patient management decisions. A negative result may occur with  improper specimen collection/handling, submission of specimen other than nasopharyngeal swab, presence of viral mutation(s) within the areas targeted by this assay, and inadequate number of viral copies(<138 copies/mL). A negative result must be combined with clinical observations, patient history, and epidemiological information. The expected result is Negative.  Fact Sheet for Patients:  BloggerCourse.com  Fact Sheet for Healthcare Providers:  SeriousBroker.it  This test is no t yet approved or cleared by the [place] FDA and  has been authorized for detection and/or diagnosis of SARS-CoV-2 by FDA under an Emergency Use Authorization (EUA). This EUA will remain  in effect (meaning this test can be used) for the duration of the COVID-19 declaration under Section 564(b)(1) of the Act, 21 U.S.C.section 360bbb-3(b)(1), unless the authorization is terminated  or revoked sooner.       Influenza A by PCR NEGATIVE NEGATIVE Final   Influenza B by PCR  NEGATIVE NEGATIVE Final    Comment: (NOTE) The Xpert Xpress SARS-CoV-2/FLU/RSV plus assay is intended as an aid in the diagnosis of influenza from Nasopharyngeal swab specimens and should not be used as a sole basis for treatment. Nasal washings and aspirates are unacceptable for Xpert Xpress SARS-CoV-2/FLU/RSV testing.  Fact Sheet for Patients: BloggerCourse.com  Fact Sheet for Healthcare Providers: SeriousBroker.it  This test is not yet approved or cleared by the [place] FDA and has been authorized for detection and/or diagnosis of SARS-CoV-2 by FDA under an Emergency Use Authorization (EUA). This EUA will remain in effect (meaning this test can be used) for the duration of the COVID-19 declaration under Section 564(b)(1) of the Act, 21 U.S.C. section 360bbb-3(b)(1), unless the authorization is terminated or revoked.  Performed at [facility], [address] [place], [place] [address]   MRSA Next Gen by PCR, Nasal     Status: None   Collection Time: 02/22/21  3:10 PM   Specimen: Nasal Mucosa; Nasal Swab  Result Value Ref Range Status   MRSA by PCR Next Gen NOT DETECTED NOT DETECTED Final    Comment: (NOTE) The GeneXpert MRSA Assay (FDA approved for NASAL specimens only), is one component of a comprehensive MRSA colonization surveillance program. It is not intended to diagnose MRSA infection nor to guide or monitor treatment for MRSA infections. Test performance is not FDA approved in patients less than 13 years old. Performed at [facility], [address], [place], [place] [address]    Creatinine: Recent Labs    02/22/21 1911 02/22/21 2322 02/23/21 0312 02/23/21 0742 02/23/21 1419 02/24/21 0259 02/24/21 0636  CREATININE 1.21 1.21 1.14 1.34* 1.08 1.09 1.10   CT scan personally reviewed and is detailed in the history of present illness  Impression/Assessment:   Fournier's gangrene  Plan:  I will be available for the operating room for assessment with general surgery.  Discussed the possible need for debridement of scrotum and penis with the  patient.  Please note the patient was seen at 730 this morning prior to going to surgery.  Ray Church, III 02/24/2021, 6:25 PM

## 2021-02-24 NOTE — Progress Notes (Signed)
Inpatient Diabetes Program Recommendations  AACE/ADA: New Consensus Statement on Inpatient Glycemic Control (2015)  Target Ranges:  Prepandial:   less than 140 mg/dL      Peak postprandial:   less than 180 mg/dL (1-2 hours)      Critically ill patients:  140 - 180 mg/dL   Lab Results  Component Value Date   GLUCAP 222 (H) 02/24/2021   HGBA1C 9.5 (H) 02/22/2021    Review of Glycemic Control  Diabetes history: DM2 Outpatient Diabetes medications: Novolin 70/30 20 units QHS, metformin 750 mg BID, glipizide 5 mg QD Current orders for Inpatient glycemic control: Levemir 10 units BID, Novolog 0-15 units Q4H  HgbA1C - 9.5% CBGs 227, 222 Received Decadron 5 mg this am.  Inpatient Diabetes Program Recommendations:    Increase Levemir to 12 units BID Will need meal coverage insulin when eating regular diet.  Spoke with pt about his HgbA1C of 9.5%. Pt states he has been taking 70/30 insulin 20 units QHS. Questioned pt about taking 70/30 before bedtime, instead of with a meal. Discussed how 70/30 works and a BID dosing at breakfast and dinner would better control his blood sugars.  Pt voices understanding. [place] Spaniel he has lost his insurance and has been getting insulin from [employer]. Stressed importance of f/u with PCP to manage his diabetes. Mentioned [facility] since pt has no insurance. Will order [facility] consult for assistance with PCP. Will continue to follow.  Thank you. Ailene Ards, RD, LDN, CDE Inpatient Diabetes Coordinator [phone]

## 2021-02-24 NOTE — Transfer of Care (Signed)
Immediate Anesthesia Transfer of Care Note  Patient: Andres Richardson  Procedure(s) Performed: INCISION AND DRAINAGE PERINEUM  Patient Location: PACU  Anesthesia Type:General  Level of Consciousness: awake and patient cooperative  Airway & Oxygen Therapy: Patient Spontanous Breathing and Patient connected to face mask oxygen  Post-op Assessment: Report given to RN and Post -op Vital signs reviewed and stable  Post vital signs: Reviewed and stable  Last Vitals:  Vitals Value Taken Time  BP 105/75 02/24/21 0951  Temp 36.9 C 02/24/21 0951  Pulse 109 02/24/21 0956  Resp 20 02/24/21 0956  SpO2 96 % 02/24/21 0956  Vitals shown include unvalidated device data.  Last Pain:  Vitals:   02/24/21 0951  TempSrc:   PainSc: 0-No pain      Patients Stated Pain Goal: 0 (02/23/21 0400)  Complications: No notable events documented.

## 2021-02-24 NOTE — Progress Notes (Signed)
PROGRESS NOTE  Andres Richardson  IRS:[id number] DOB: August 11, 1966 DOA: 02/22/2021 PCP: [name], MD   Brief Narrative: Andres Richardson is a 55 y.o. male with a history of tobacco use, T2DM who presented to the ED with several days of worsening intractable nausea and vomiting found to be in DKA started on insulin infusion at admission 7/10. Metabolic parameters improved though the patient demonstrated ongoing fever and vomiting for which CT abd/pelvis was performed revealing perirectal abscess and changes consistent with Fournier's gangrene. Surgery was emergently consulted and took the patient for debridement.   Assessment & Plan: Principal Problem:   Fournier's gangrene Active Problems:   DKA (diabetic ketoacidosis) (HCC)  DKA in T2DM: Has had diabetes since mid-1980's, only on insulin for past year or so managed by PCP. DKA improving metabolically, still elevated BHB.  - Continue levemir BID + SSI. Will augment.  Fournier's gangrene: Precipitant of DKA.  - Monitor cultures - Continue broad IV antibiotics.  - Wound care and pain control as ordered. Will need trip back to OR in 24-48 hours.   Hypernatremia:  - Continue low level hypotonic saline for now, monitor in AM.   HTN: Elevated due to duress.  - Continue prn's and monitoring in PCU  Tobacco use:   - Patch ordered.   Obesity: Estimated body mass index is 31.27 kg/m as calculated from the following:   Height as of this encounter: 5\' 11"  (1.803 m).   Weight as of this encounter: 101.7 kg.  DVT prophylaxis: SCDs Code Status: DNR Family Communication: Friend at bedside Disposition Plan:  Status is: Inpatient  Remains inpatient appropriate because:Persistent severe electrolyte disturbances, Ongoing diagnostic testing needed not appropriate for outpatient work up, and Inpatient level of care appropriate due to severity of illness  Dispo: The patient is from: Home              Anticipated d/c is to: Home               Patient currently is not medically stable to d/c.   Difficult to place patient No  Consultants:  Surgery Urology  Procedures:  Debridement of perineal area (15 x 10 x 8 cm, containing skin, subcutaneous tissue, fascia)  Antimicrobials: Vancomycin, zosyn, clindamycin   Subjective: Nausea and vomiting much better since surgery, pain very well controlled. Eating some. No actual complaints. Of note, he denied having any wounds or other pain yesterday.  Objective: Vitals:   02/24/21 1400 02/24/21 1526 02/24/21 1600 02/24/21 1800  BP: (!) 151/75   107/60  Pulse: (!) 111  (!) 105 (!) 108  Resp: (!) 24   (!) 26  Temp:  99.5 F (37.5 C)    TempSrc:  Oral    SpO2: 95%  96% 97%  Weight:      Height:        Intake/Output Summary (Last 24 hours) at 02/24/2021 1926 Last data filed at 02/24/2021 1800 Gross per 24 hour  Intake 2366.65 ml  Output 1800 ml  Net 566.65 ml   Filed Weights   02/22/21 1509 02/23/21 0400 02/24/21 0751  Weight: 101.1 kg 101.7 kg 101.7 kg   Gen: 56 y.o. male in no distress Pulm: Nonlabored breathing room air. Clear. CV: Regular tachycardia No murmur, rub, or gallop. No JVD, no dependent edema. GI: Abdomen soft, non-tender, non-distended, with normoactive bowel sounds.  Ext: Warm, no deformities Skin: Perineum not examined right after surgery. No other new rashes, lesions or ulcers on visualized skin. Neuro: Alert  and oriented. No focal neurological deficits. Psych: Judgement and insight appear fair. Mood euthymic & affect congruent. Behavior is appropriate.    Data Reviewed: I have personally reviewed following labs and imaging studies  CBC: Recent Labs  Lab 02/22/21 1118 02/22/21 1155 02/23/21 0956 02/24/21 0636  WBC 13.2*  --  12.6* 12.2*  NEUTROABS 11.4*  --   --  10.0*  HGB 13.2 13.3 10.9* 11.8*  HCT 40.5 39.0 33.3* 37.7*  MCV 89.0  --  89.3 92.6  PLT 286  --  266 256   Basic Metabolic Panel: Recent Labs  Lab 02/22/21 2322 02/23/21 0312  02/23/21 0742 02/23/21 1419 02/24/21 0259 02/24/21 0636  NA 145 147* 146* 145  --  144  K 3.7 3.6 5.0 3.6  --  3.5  CL 110 110 116* 110  --  109  CO2 26 27 20* 25  --  22  GLUCOSE 174* 165* 165* 177*  --  197*  BUN 25* 25* 24* 20  --  18  CREATININE 1.21 1.14 1.34* 1.08 1.09 1.10  CALCIUM 8.6* 8.6* 8.6* 8.6*  --  8.1*   GFR: Estimated Creatinine Clearance: 92.2 mL/min (by C-G formula based on SCr of 1.1 mg/dL). Liver Function Tests: Recent Labs  Lab 02/22/21 1118  AST 10*  ALT 10  ALKPHOS 80  BILITOT 1.4*  PROT 7.7  ALBUMIN 3.3*   No results for input(s): LIPASE, AMYLASE in the last 168 hours. No results for input(s): AMMONIA in the last 168 hours. Coagulation Profile: No results for input(s): INR, PROTIME in the last 168 hours. Cardiac Enzymes: No results for input(s): CKTOTAL, CKMB, CKMBINDEX, TROPONINI in the last 168 hours. BNP (last 3 results) No results for input(s): PROBNP in the last 8760 hours. HbA1C: Recent Labs    02/22/21 1638  HGBA1C 9.5*   CBG: Recent Labs  Lab 02/24/21 0418 02/24/21 0740 02/24/21 0955 02/24/21 1513 02/24/21 1917  GLUCAP 221* 227* 222* 269* 249*   Lipid Profile: No results for input(s): CHOL, HDL, LDLCALC, TRIG, CHOLHDL, LDLDIRECT in the last 72 hours. Thyroid Function Tests: No results for input(s): TSH, T4TOTAL, FREET4, T3FREE, THYROIDAB in the last 72 hours. Anemia Panel: Recent Labs    02/23/21 0956  VITAMINB12 158*  FOLATE 7.8   Urine analysis:    Component Value Date/Time   COLORURINE YELLOW 05/10/2019 2350   APPEARANCEUR CLEAR 05/10/2019 2350   LABSPEC 1.021 05/10/2019 2350   PHURINE 5.0 05/10/2019 2350   GLUCOSEU >=500 (A) 05/10/2019 2350   HGBUR SMALL (A) 05/10/2019 2350   BILIRUBINUR NEGATIVE 05/10/2019 2350   KETONESUR 80 (A) 05/10/2019 2350   PROTEINUR 30 (A) 05/10/2019 2350   NITRITE NEGATIVE 05/10/2019 2350   LEUKOCYTESUR NEGATIVE 05/10/2019 2350   Recent Results (from the past 240 hour(s))   Resp Panel by RT-PCR (Flu A&B, Covid) Nasopharyngeal Swab     Status: None   Collection Time: 02/22/21  1:25 PM   Specimen: Nasopharyngeal Swab; Nasopharyngeal(NP) swabs in vial transport medium  Result Value Ref Range Status   SARS Coronavirus 2 by RT PCR NEGATIVE NEGATIVE Final    Comment: (NOTE) SARS-CoV-2 target nucleic acids are NOT DETECTED.  The SARS-CoV-2 RNA is generally detectable in upper respiratory specimens during the acute phase of infection. The lowest concentration of SARS-CoV-2 viral copies this assay can detect is 138 copies/mL. A negative result does not preclude SARS-Cov-2 infection and should not be used as the sole basis for treatment or other patient management decisions. A negative  result may occur with  improper specimen collection/handling, submission of specimen other than nasopharyngeal swab, presence of viral mutation(s) within the areas targeted by this assay, and inadequate number of viral copies(<138 copies/mL). A negative result must be combined with clinical observations, patient history, and epidemiological information. The expected result is Negative.  Fact Sheet for Patients:  BloggerCourse.com  Fact Sheet for Healthcare Providers:  SeriousBroker.it  This test is no t yet approved or cleared by the [place] FDA and  has been authorized for detection and/or diagnosis of SARS-CoV-2 by FDA under an Emergency Use Authorization (EUA). This EUA will remain  in effect (meaning this test can be used) for the duration of the COVID-19 declaration under Section 564(b)(1) of the Act, 21 U.S.C.section 360bbb-3(b)(1), unless the authorization is terminated  or revoked sooner.       Influenza A by PCR NEGATIVE NEGATIVE Final   Influenza B by PCR NEGATIVE NEGATIVE Final    Comment: (NOTE) The Xpert Xpress SARS-CoV-2/FLU/RSV plus assay is intended as an aid in the diagnosis of influenza from  Nasopharyngeal swab specimens and should not be used as a sole basis for treatment. Nasal washings and aspirates are unacceptable for Xpert Xpress SARS-CoV-2/FLU/RSV testing.  Fact Sheet for Patients: BloggerCourse.com  Fact Sheet for Healthcare Providers: SeriousBroker.it  This test is not yet approved or cleared by the [place] FDA and has been authorized for detection and/or diagnosis of SARS-CoV-2 by FDA under an Emergency Use Authorization (EUA). This EUA will remain in effect (meaning this test can be used) for the duration of the COVID-19 declaration under Section 564(b)(1) of the Act, 21 U.S.C. section 360bbb-3(b)(1), unless the authorization is terminated or revoked.  Performed at [facility], [address], [place], [place] [address]   MRSA Next Gen by PCR, Nasal     Status: None   Collection Time: 02/22/21  3:10 PM   Specimen: Nasal Mucosa; Nasal Swab  Result Value Ref Range Status   MRSA by PCR Next Gen NOT DETECTED NOT DETECTED Final    Comment: (NOTE) The GeneXpert MRSA Assay (FDA approved for NASAL specimens only), is one component of a comprehensive MRSA colonization surveillance program. It is not intended to diagnose MRSA infection nor to guide or monitor treatment for MRSA infections. Test performance is not FDA approved in patients less than 80 years old. Performed at [facility], [address], [place], [place] [address]       Radiology Studies: CT ABDOMEN PELVIS W CONTRAST  Result Date: 02/23/2021 CLINICAL DATA:  Abdominal pain with nausea and vomiting EXAM: CT ABDOMEN AND PELVIS WITH CONTRAST TECHNIQUE: Multidetector CT imaging of the abdomen and pelvis was performed using the standard protocol following bolus administration of intravenous contrast. CONTRAST:  23mL OMNIPAQUE IOHEXOL 350 MG/ML SOLN COMPARISON:  05/04/2017 FINDINGS: Lower chest: Mild  emphysematous changes are noted. No focal infiltrate or sizable effusion is seen. Hepatobiliary: Fatty infiltration of the liver is noted. The gallbladder is well distended. Dependent density is seen which may be related to gallbladder sludge or small stones. Pancreas: Unremarkable. No pancreatic ductal dilatation or surrounding inflammatory changes. Spleen: Normal in size without focal abnormality. Adrenals/Urinary Tract: Adrenal glands are within normal limits. Kidneys are well visualized bilaterally. Normal enhancement pattern is seen bilaterally. No renal calculi or obstructive changes are noted. Delayed images demonstrate normal excretion of contrast material. Ureters are within normal limits. Bladder is well distended. Stomach/Bowel: Colon is well visualized without obstructive or inflammatory changes. Mild  retained fecal material is noted within the right colon consistent with a degree of constipation. The appendix is not well visualized and may have been surgically removed. Small bowel shows no obstructive changes. The stomach is within normal limits. Vascular/Lymphatic: Aortic atherosclerosis. Few small reactive lymph nodes are noted in the right iliac chain. Reproductive: Prostate is unremarkable. Other: There is subcutaneous inflammatory change in the region of the perineum extending into both buttocks superiorly slightly greater on the left than the right. A small focal subcutaneous fluid collection is noted which measures 2.7 x 2.2 cm best noted on image number 12 of series 4 in the medial aspect of the right buttock along the intergluteal cleft. The subcutaneous air extends superiorly adjacent to the right inguinal canal. No other definitive fluid collection is seen. These changes are most consistent with Fournier's gangrene. Additionally some inflammatory change extends along the right lateral pelvic wall posteriorly with some reactive lymphadenopathy identified. No free fluid is noted.  Musculoskeletal: No acute bony abnormality is noted. IMPRESSION: Changes in the subcutaneous tissues of the perineum/bilateral buttocks and extending into the right lateral pelvic wall consistent with Fournier's gangrene. Some thickening of the operator internus muscle on the right is noted. A small subcutaneous fluid collection is noted in the medial aspect of the right buttocks. Mild changes of colonic constipation in the right colon. Dependent density in the gallbladder likely representing sludge or small gallstones. Mild emphysematous changes. These results will be called to the ordering clinician or representative by the Radiologist Assistant, and communication documented in the PACS or [employer] Dashboard. Electronically Signed   By: Alcide CleverMark  Lukens M.D.   On: 02/23/2021 20:01    Scheduled Meds:  Chlorhexidine Gluconate Cloth  6 each Topical Q0600   cyanocobalamin  1,000 mcg Intramuscular Daily   insulin aspart  0-15 Units Subcutaneous Q4H   insulin detemir  10 Units Subcutaneous BID   mouth rinse  15 mL Mouth Rinse BID   nicotine  21 mg Transdermal Daily   pantoprazole (PROTONIX) IV  40 mg Intravenous Once   Continuous Infusions:  clindamycin (CLEOCIN) IV Stopped (02/24/21 1358)   piperacillin-tazobactam Stopped (02/24/21 1842)   promethazine (PHENERGAN) injection (IM or IVPB)     vancomycin Stopped (02/24/21 1130)     LOS: 2 days   Time spent: 35 minutes.  Tyrone Nineyan B Tomasina Keasling, MD [facility] [email or web address] 02/24/2021, 7:26 PM

## 2021-02-24 NOTE — Op Note (Signed)
Operative Note  Preoperative diagnosis:  1.  Fournier's gangrene  Postoperative diagnosis: 1.  Fournier's gangrene  Procedure(s): 1.  Simple Foley catheter insertion 2.  Examination under anesthesia with digital debridement of perineum   Surgeon: Modena Slater, MD  Co-surgeon: Feliciana Rossetti  Anesthesia: General  Complications: None immediate  EBL: Minimal  Specimens: 1.  None for my portion  Drains/Catheters: 1.  Foley catheter  Intraoperative findings: Perineum had been debrided with viable tissue underneath.  All necrotic tissue had been debrided.  Urethra was not violated.  Debridement did not involve the urethra.  There was some tracking up the inguinal area bilaterally but there was no evidence of any scrotal or penile involvement.  Indication: 55 year old male with Fournier's gangrene presents for debridement  Description of procedure:  The patient was identified and consent was obtained.    Andres Richardson with general surgery performed his portion of the surgery which was the majority of the surgery.  Upon entry, the patient was under general anesthesia and in lithotomy position.  He had undergone extensive perineal debridement.  I scrubbed in and inspected the area.  I first placed a 82 French Foley catheter which passed easily into the bladder with return of clear yellow urine.  I inspected the entire perineal wound.  There was no evidence of tracking towards the urethra.  There was no evidence of urethral injury or urethral involvement.  Scrotum was normal without crepitus or fluctuance.  I palpated up the inguinal region/groin bilaterally and digitally probed that area.  This had already been drained .  The dartos was felt intact and I did not feel it necessary to explore further.  No further debridement was necessary.  The case was handed back over to general surgery  Plan: Continue Foley catheter as long as it is necessary for urinary diversion.  We will be  available if needed for future debridements.  Did not appear to be affecting any genitourinary organs.

## 2021-02-24 NOTE — Anesthesia Preprocedure Evaluation (Addendum)
Anesthesia Evaluation  Patient identified by MRN, date of birth, ID band Patient awake    Reviewed: Allergy & Precautions, NPO status , Patient's Chart, lab work & pertinent test results  History of Anesthesia Complications Negative for: history of anesthetic complications  Airway Mallampati: III  TM Distance: >3 FB Neck ROM: Full    Dental  (+) Dental Advisory Given, Poor Dentition   Pulmonary Current Smoker and Patient abstained from smoking.,    breath sounds clear to auscultation       Cardiovascular hypertension, + Peripheral Vascular Disease   Rhythm:Regular     Neuro/Psych    GI/Hepatic negative GI ROS, Neg liver ROS,   Endo/Other  diabetes, Poorly ControlledLab Results      Component                Value               Date                      HGBA1C                   9.5 (H)             02/22/2021             Renal/GU negative Renal ROS     Musculoskeletal   Abdominal   Peds  Hematology   Anesthesia Other Findings fournier gangrene  Reproductive/Obstetrics                            Anesthesia Physical Anesthesia Plan  ASA: 3  Anesthesia Plan: General   Post-op Pain Management:    Induction: Intravenous  PONV Risk Score and Plan: 1 and Ondansetron and Treatment may vary due to age or medical condition  Airway Management Planned: Oral ETT and LMA  Additional Equipment: None  Intra-op Plan:   Post-operative Plan: Extubation in OR  Informed Consent: I have reviewed the patients History and Physical, chart, labs and discussed the procedure including the risks, benefits and alternatives for the proposed anesthesia with the patient or authorized representative who has indicated his/her understanding and acceptance.     Dental advisory given  Plan Discussed with: CRNA and Surgeon  Anesthesia Plan Comments:         Anesthesia Quick Evaluation

## 2021-02-24 NOTE — Anesthesia Procedure Notes (Signed)
Procedure Name: Intubation Date/Time: 02/24/2021 8:40 AM Performed by: Jari Pigg, CRNA Pre-anesthesia Checklist: Patient identified, Emergency Drugs available, Suction available and Patient being monitored Patient Re-evaluated:Patient Re-evaluated prior to induction Oxygen Delivery Method: Circle system utilized Preoxygenation: Pre-oxygenation with 100% oxygen Induction Type: IV induction Ventilation: Mask ventilation without difficulty Laryngoscope Size: Mac and 4 Grade View: Grade I Tube type: Oral Number of attempts: 1 Airway Equipment and Method: Stylet and Oral airway Placement Confirmation: ETT inserted through vocal cords under direct vision, positive ETCO2 and breath sounds checked- equal and bilateral Secured at: 23 cm Tube secured with: Tape Dental Injury: Teeth and Oropharynx as per pre-operative assessment

## 2021-02-24 NOTE — Op Note (Signed)
Preoperative diagnosis: Fournier's gangrene  Postoperative diagnosis: same   Procedure: debridement of perineal area (15 x 10 x 8 cm, containing skin, subcutaneous tissue, fascia)  Surgeon: Feliciana Rossetti, M.D.  Co-Surgeon: Janeece Agee, Urology  Anesthesia: General  Indications for procedure: Andres Richardson is a 55 y.o. year old male with symptoms of perianal pain and drainage. On exam he had induration. He was in DKA and treated in the ICU. He had a CT scan showing gas around the anal area and tracking along the scrotum. He was therefore taking to the operating room.  Description of procedure: The patient was brought into the operative suite. Anesthesia was administered with General endotracheal anesthesia. WHO checklist was applied. The patient was then placed in lithotomy position. The area was prepped and draped in the usual sterile fashion.  Next, incision was made over the induration on the left and right of the anus. Wallace Cullens thin fluid drained out consistent with necrotizing soft tissue infection. Cultures were sent. Cautery and knife were used for sharp debridement. The infection tracted anteriorly along both inguinal areas with further thin gray drainage and necrotic fat. It also tracted posteriorly on the right side. Continued sharp dissection was performed of subcutaneous tissue and musculature. Bleeding was treated with cautery. The wound measured 15 cm by 10 cm and 8 cm deep with tracking up to the anterior groin on the right side. The majority of tissue surrounding the anal canal was necrotic leading to an [place] of tissue containing the anus.  At this time Dr. Alvester Morin came in and placed a foley. He examined the contents of the scrotum and penis. It appeared the scrotum was intact and there was no tracking within the dartos fascia or spongiosum/urethra.  The area was packed with salinated kerlex and dressing placed. He returned to the ICU hemodynamically stable.  Excisional  debridement:  1.  Progress note or procedure note with a detailed description of the procedure.  2.  Tool used for debridement (curette, scapel, etc.)  cautery, scalpel, curette, blunt dissection  3.  Frequency of surgical debridement.   First time  4.  Measurement of total devitalized tissue (wound surface) before and after surgical debridement.   15 x 10  5.  Area and depth of devitalized tissue removed from wound.  Perineal 8 cm depth  6.  Blood loss and description of tissue removed.  200 ml  7.  Evidence of the progress of the wound's response to treatment.  A.  Current wound volume (current dimensions and depth).  15 x 10 x 8 cm  B.  Presence (and extent of) of infection.  Yes, necrotizing infection  C.  Presence (and extent of) of non viable tissue.  yes  D.  Other material in the wound that is expected to inhibit healing.  no  8.  Was there any viable tissue removed (measurements): no  Findings: soft tissue infection of the perineal space  Specimen: culture of soft tissue infection  Implant: kerlex   Blood loss: 200 ml  Local anesthesia: none  Complications: none  Treatment plan: He will continue on antibiotics, strict glycemic control with return to the operating room in 24-48h for recheck and potential debridement and potential fecal diversion  Feliciana Rossetti, M.D. General, Bariatric, & Minimally Invasive Surgery [facility]

## 2021-02-25 ENCOUNTER — Encounter (HOSPITAL_COMMUNITY)
Admission: EM | Disposition: A | Discharge status: Home Health | Payer: Self-pay | Source: Home / Self Care | Attending: Internal Medicine

## 2021-02-25 ENCOUNTER — Encounter (HOSPITAL_COMMUNITY): Payer: Self-pay | Admitting: Internal Medicine

## 2021-02-25 ENCOUNTER — Inpatient Hospital Stay (HOSPITAL_COMMUNITY): Payer: Self-pay | Admitting: Certified Registered Nurse Anesthetist

## 2021-02-25 DIAGNOSIS — N493 Fournier gangrene: Principal | ICD-10-CM

## 2021-02-25 HISTORY — PX: INCISION AND DRAINAGE ABSCESS: SHX5864

## 2021-02-25 HISTORY — PX: LAPAROSCOPIC DIVERTED COLOSTOMY: SHX5892

## 2021-02-25 LAB — BASIC METABOLIC PANEL
Anion gap: 10 (ref 5–15)
BUN: 24 mg/dL — ABNORMAL HIGH (ref 6–20)
CO2: 23 mmol/L (ref 22–32)
Calcium: 7.2 mg/dL — ABNORMAL LOW (ref 8.9–10.3)
Chloride: 107 mmol/L (ref 98–111)
Creatinine, Ser: 1.26 mg/dL — ABNORMAL HIGH (ref 0.61–1.24)
GFR, Estimated: >60 mL/min (ref >60)
Glucose, Bld: 190 mg/dL — ABNORMAL HIGH (ref 70–99)
Potassium: 3.3 mmol/L — ABNORMAL LOW (ref 3.5–5.1)
Sodium: 140 mmol/L (ref 135–145)

## 2021-02-25 LAB — CBC WITH DIFFERENTIAL/PLATELET
Abs Immature Granulocytes: 0.08 10*3/uL — ABNORMAL HIGH (ref 0.00–0.07)
Basophils Absolute: 0 10*3/uL (ref 0.0–0.1)
Basophils Relative: 0 %
Eosinophils Absolute: 0 10*3/uL (ref 0.0–0.5)
Eosinophils Relative: 0 %
HCT: 33.6 % — ABNORMAL LOW (ref 39.0–52.0)
Hemoglobin: 10.8 g/dL — ABNORMAL LOW (ref 13.0–17.0)
Immature Granulocytes: 1 %
Lymphocytes Relative: 7 %
Lymphs Abs: 0.8 10*3/uL (ref 0.7–4.0)
MCH: 29.3 pg (ref 26.0–34.0)
MCHC: 32.1 g/dL (ref 30.0–36.0)
MCV: 91.1 fL (ref 80.0–100.0)
Monocytes Absolute: 1 10*3/uL (ref 0.1–1.0)
Monocytes Relative: 10 %
Neutro Abs: 8.6 10*3/uL — ABNORMAL HIGH (ref 1.7–7.7)
Neutrophils Relative %: 82 %
Platelets: 222 10*3/uL (ref 150–400)
RBC: 3.69 MIL/uL — ABNORMAL LOW (ref 4.22–5.81)
RDW: 15.1 % (ref 11.5–15.5)
WBC: 10.5 10*3/uL (ref 4.0–10.5)
nRBC: 0 % (ref 0.0–0.2)

## 2021-02-25 LAB — GLUCOSE, CAPILLARY
Glucose-Capillary: 173 mg/dL — ABNORMAL HIGH (ref 70–99)
Glucose-Capillary: 166 mg/dL — ABNORMAL HIGH (ref 70–99)
Glucose-Capillary: 151 mg/dL — ABNORMAL HIGH (ref 70–99)
Glucose-Capillary: 169 mg/dL — ABNORMAL HIGH (ref 70–99)
Glucose-Capillary: 280 mg/dL — ABNORMAL HIGH (ref 70–99)
Glucose-Capillary: 304 mg/dL — ABNORMAL HIGH (ref 70–99)
Glucose-Capillary: 272 mg/dL — ABNORMAL HIGH (ref 70–99)

## 2021-02-25 SURGERY — INCISION AND DRAINAGE, ABSCESS
Anesthesia: General | Site: Perineum

## 2021-02-25 MED ORDER — SUGAMMADEX SODIUM 200 MG/2ML IV SOLN
INTRAVENOUS | Status: DC | PRN
  Administered 2021-02-25: 200 mg via INTRAVENOUS

## 2021-02-25 MED ORDER — MIDAZOLAM HCL 2 MG/2ML IJ SOLN
INTRAMUSCULAR | Status: DC | PRN
  Administered 2021-02-25: 2 mg via INTRAVENOUS

## 2021-02-25 MED ORDER — AMISULPRIDE (ANTIEMETIC) 5 MG/2ML IV SOLN: 10.0000 mg | Freq: Once | INTRAVENOUS | Status: DC | PRN

## 2021-02-25 MED ORDER — ALBUMIN HUMAN 5 % IV SOLN: INTRAVENOUS | Status: DC | PRN

## 2021-02-25 MED ORDER — BUPIVACAINE-EPINEPHRINE (PF) 0.25% -1:200000 IJ SOLN
INTRAMUSCULAR | Status: DC | PRN
  Administered 2021-02-25: 20 mL

## 2021-02-25 MED ORDER — ONDANSETRON HCL 4 MG/2ML IJ SOLN
INTRAMUSCULAR | Status: AC
  Filled 2021-02-25: qty 2

## 2021-02-25 MED ORDER — OXYCODONE HCL 5 MG PO TABS
ORAL_TABLET | ORAL | Status: AC
  Administered 2021-02-25: 5 mg via ORAL
  Filled 2021-02-25: qty 1

## 2021-02-25 MED ORDER — OXYCODONE HCL 5 MG PO TABS: 5.0000 mg | ORAL_TABLET | Freq: Once | ORAL | Status: AC | PRN

## 2021-02-25 MED ORDER — FENTANYL CITRATE (PF) 100 MCG/2ML IJ SOLN
INTRAMUSCULAR | Status: AC
  Administered 2021-02-25: 25 ug via INTRAVENOUS
  Filled 2021-02-25: qty 2

## 2021-02-25 MED ORDER — SODIUM CHLORIDE 0.45 % IV SOLN: INTRAVENOUS | Status: AC

## 2021-02-25 MED ORDER — ACETAMINOPHEN 10 MG/ML IV SOLN
1000.0000 mg | Freq: Once | INTRAVENOUS | Status: DC | PRN
  Administered 2021-02-25: 1000 mg via INTRAVENOUS

## 2021-02-25 MED ORDER — PHENYLEPHRINE HCL-NACL 10-0.9 MG/250ML-% IV SOLN
INTRAVENOUS | Status: DC | PRN
  Administered 2021-02-25: 50 ug/min via INTRAVENOUS

## 2021-02-25 MED ORDER — DEXAMETHASONE SODIUM PHOSPHATE 10 MG/ML IJ SOLN
INTRAMUSCULAR | Status: AC
  Filled 2021-02-25: qty 1

## 2021-02-25 MED ORDER — ROCURONIUM BROMIDE 10 MG/ML (PF) SYRINGE
PREFILLED_SYRINGE | INTRAVENOUS | Status: AC
  Filled 2021-02-25: qty 10

## 2021-02-25 MED ORDER — PANTOPRAZOLE SODIUM 40 MG IV SOLR
40.0000 mg | INTRAVENOUS | Status: DC
  Administered 2021-02-25 – 2021-02-28 (×4): 40 mg via INTRAVENOUS
  Filled 2021-02-25 (×4): qty 40

## 2021-02-25 MED ORDER — BUPIVACAINE-EPINEPHRINE (PF) 0.25% -1:200000 IJ SOLN
INTRAMUSCULAR | Status: AC
  Filled 2021-02-25: qty 30

## 2021-02-25 MED ORDER — ORAL CARE MOUTH RINSE: 15.0000 mL | Freq: Once | OROMUCOSAL | Status: DC

## 2021-02-25 MED ORDER — ACETAMINOPHEN 325 MG PO TABS: 325.0000 mg | ORAL_TABLET | ORAL | Status: DC | PRN

## 2021-02-25 MED ORDER — SODIUM CHLORIDE 0.9 % IR SOLN
Status: DC | PRN
  Administered 2021-02-25: 1000 mL

## 2021-02-25 MED ORDER — ROCURONIUM BROMIDE 10 MG/ML (PF) SYRINGE
PREFILLED_SYRINGE | INTRAVENOUS | Status: DC | PRN
  Administered 2021-02-25: 60 mg via INTRAVENOUS
  Administered 2021-02-25: 10 mg via INTRAVENOUS

## 2021-02-25 MED ORDER — DEXAMETHASONE SODIUM PHOSPHATE 10 MG/ML IJ SOLN
INTRAMUSCULAR | Status: DC | PRN
  Administered 2021-02-25: 5 mg via INTRAVENOUS

## 2021-02-25 MED ORDER — PROPOFOL 10 MG/ML IV BOLUS
INTRAVENOUS | Status: AC
  Filled 2021-02-25: qty 20

## 2021-02-25 MED ORDER — ACETAMINOPHEN 10 MG/ML IV SOLN
INTRAVENOUS | Status: AC
  Filled 2021-02-25: qty 100

## 2021-02-25 MED ORDER — ACETAMINOPHEN 160 MG/5ML PO SOLN: 325.0000 mg | ORAL | Status: DC | PRN

## 2021-02-25 MED ORDER — FENTANYL CITRATE (PF) 250 MCG/5ML IJ SOLN
INTRAMUSCULAR | Status: AC
  Filled 2021-02-25: qty 5

## 2021-02-25 MED ORDER — ONDANSETRON HCL 4 MG/2ML IJ SOLN
INTRAMUSCULAR | Status: DC | PRN
  Administered 2021-02-25: 4 mg via INTRAVENOUS

## 2021-02-25 MED ORDER — POTASSIUM CHLORIDE 10 MEQ/100ML IV SOLN
10.0000 meq | INTRAVENOUS | Status: AC
  Administered 2021-02-25 (×3): 10 meq via INTRAVENOUS
  Filled 2021-02-25 (×3): qty 100

## 2021-02-25 MED ORDER — LIDOCAINE HCL (CARDIAC) PF 100 MG/5ML IV SOSY
PREFILLED_SYRINGE | INTRAVENOUS | Status: DC | PRN
  Administered 2021-02-25: 60 mg via INTRAVENOUS

## 2021-02-25 MED ORDER — VANCOMYCIN HCL 750 MG/150ML IV SOLN
750.0000 mg | Freq: Two times a day (BID) | INTRAVENOUS | Status: DC
  Administered 2021-02-25 – 2021-02-27 (×4): 750 mg via INTRAVENOUS
  Filled 2021-02-25 (×5): qty 150

## 2021-02-25 MED ORDER — FENTANYL CITRATE (PF) 250 MCG/5ML IJ SOLN
INTRAMUSCULAR | Status: DC | PRN
  Administered 2021-02-25: 100 ug via INTRAVENOUS

## 2021-02-25 MED ORDER — PROPOFOL 10 MG/ML IV BOLUS
INTRAVENOUS | Status: DC | PRN
  Administered 2021-02-25: 150 mg via INTRAVENOUS

## 2021-02-25 MED ORDER — PHENYLEPHRINE 40 MCG/ML (10ML) SYRINGE FOR IV PUSH (FOR BLOOD PRESSURE SUPPORT)
PREFILLED_SYRINGE | INTRAVENOUS | Status: DC | PRN
  Administered 2021-02-25 (×3): 120 ug via INTRAVENOUS

## 2021-02-25 MED ORDER — CHLORHEXIDINE GLUCONATE 0.12 % MT SOLN: 15.0000 mL | Freq: Once | OROMUCOSAL | Status: DC

## 2021-02-25 MED ORDER — MIDAZOLAM HCL 2 MG/2ML IJ SOLN
INTRAMUSCULAR | Status: AC
  Filled 2021-02-25: qty 2

## 2021-02-25 MED ORDER — LACTATED RINGERS IV SOLN: INTRAVENOUS | Status: DC

## 2021-02-25 MED ORDER — PHENYLEPHRINE HCL (PRESSORS) 10 MG/ML IV SOLN
INTRAVENOUS | Status: AC
  Filled 2021-02-25: qty 1

## 2021-02-25 MED ORDER — ALBUMIN HUMAN 5 % IV SOLN
INTRAVENOUS | Status: AC
  Filled 2021-02-25: qty 500

## 2021-02-25 MED ORDER — FENTANYL CITRATE (PF) 100 MCG/2ML IJ SOLN
25.0000 ug | INTRAMUSCULAR | Status: DC | PRN
  Administered 2021-02-25: 50 ug via INTRAVENOUS

## 2021-02-25 MED ORDER — PROMETHAZINE HCL 25 MG/ML IJ SOLN: 6.2500 mg | INTRAMUSCULAR | Status: DC | PRN

## 2021-02-25 MED ORDER — OXYCODONE HCL 5 MG/5ML PO SOLN: 5.0000 mg | Freq: Once | ORAL | Status: AC | PRN

## 2021-02-25 MED ORDER — LIDOCAINE 2% (20 MG/ML) 5 ML SYRINGE
INTRAMUSCULAR | Status: AC
  Filled 2021-02-25: qty 5

## 2021-02-25 SURGICAL SUPPLY — 33 items
BAG COUNTER SPONGE SURGICOUNT (BAG) IMPLANT
BLADE SURG SZ11 CARB STEEL (BLADE) ×3 IMPLANT
BNDG GAUZE ELAST 4 BULKY (GAUZE/BANDAGES/DRESSINGS) ×3 IMPLANT
CHLORAPREP W/TINT 26 (MISCELLANEOUS) ×6 IMPLANT
COVER SURGICAL LIGHT HANDLE (MISCELLANEOUS) ×3 IMPLANT
DECANTER SPIKE VIAL GLASS SM (MISCELLANEOUS) IMPLANT
DRAPE LAPAROSCOPIC ABDOMINAL (DRAPES) IMPLANT
DRAPE LAPAROTOMY TRNSV 102X78 (DRAPES) IMPLANT
DRSG PAD ABDOMINAL 8X10 ST (GAUZE/BANDAGES/DRESSINGS) ×6 IMPLANT
DRSG TEGADERM 2-3/8X2-3/4 SM (GAUZE/BANDAGES/DRESSINGS) ×3 IMPLANT
ELECT REM PT RETURN 15FT ADLT (MISCELLANEOUS) ×3 IMPLANT
GAUZE SPONGE 2X2 8PLY STRL LF (GAUZE/BANDAGES/DRESSINGS) ×2 IMPLANT
GAUZE SPONGE 4X4 12PLY STRL (GAUZE/BANDAGES/DRESSINGS) IMPLANT
GLOVE SURG POLYISO LF SZ7 (GLOVE) ×3 IMPLANT
GLOVE SURG UNDER POLY LF SZ7 (GLOVE) ×3 IMPLANT
GOWN STRL REUS W/TWL LRG LVL3 (GOWN DISPOSABLE) ×3 IMPLANT
GOWN STRL REUS W/TWL XL LVL3 (GOWN DISPOSABLE) ×3 IMPLANT
KIT BASIN OR (CUSTOM PROCEDURE TRAY) ×3 IMPLANT
KIT TURNOVER KIT A (KITS) ×3 IMPLANT
LOOP OSTOMY BRIDGE (OSTOMY) ×3 IMPLANT
PACK GENERAL/GYN (CUSTOM PROCEDURE TRAY) ×3 IMPLANT
PENCIL SMOKE EVACUATOR (MISCELLANEOUS) ×3 IMPLANT
SHEET LAVH (DRAPES) ×3 IMPLANT
SPONGE GAUZE 2X2 STER 10/PKG (GAUZE/BANDAGES/DRESSINGS) ×1
STAPLER VISISTAT 35W (STAPLE) ×3 IMPLANT
SURGILUBE 2OZ TUBE FLIPTOP (MISCELLANEOUS) IMPLANT
SUT MNCRL AB 4-0 PS2 18 (SUTURE) IMPLANT
SUT SILK 2 0 SH CR/8 (SUTURE) ×3 IMPLANT
SUT VIC AB 3-0 SH 18 (SUTURE) ×3 IMPLANT
SWAB COLLECTION DEVICE MRSA (MISCELLANEOUS) IMPLANT
SWAB CULTURE ESWAB REG 1ML (MISCELLANEOUS) IMPLANT
TOWEL OR 17X26 10 PK STRL BLUE (TOWEL DISPOSABLE) ×3 IMPLANT
TOWEL OR NON WOVEN STRL DISP B (DISPOSABLE) ×3 IMPLANT

## 2021-02-25 NOTE — Anesthesia Preprocedure Evaluation (Addendum)
Anesthesia Evaluation  Patient identified by MRN, date of birth, ID band Patient awake    Reviewed: Allergy & Precautions, NPO status , Patient's Chart, lab work & pertinent test results  Airway Mallampati: II  TM Distance: >3 FB Neck ROM: Full    Dental  (+) Edentulous Upper, Edentulous Lower   Pulmonary Current Smoker and Patient abstained from smoking.,     + decreased breath sounds      Cardiovascular hypertension, + Peripheral Vascular Disease   Rhythm:Regular Rate:Normal  EKG:  Sinus tach w/ PVC's   Neuro/Psych negative neurological ROS  negative psych ROS   GI/Hepatic negative GI ROS, Neg liver ROS,   Endo/Other  diabetes, Type 2, Oral Hypoglycemic Agents, Insulin Dependent  Renal/GU negative Renal ROS     Musculoskeletal negative musculoskeletal ROS (+)   Abdominal Normal abdominal exam  (+)   Peds  Hematology negative hematology ROS (+)   Anesthesia Other Findings   Reproductive/Obstetrics                           Anesthesia Physical Anesthesia Plan  ASA: 3  Anesthesia Plan: General   Post-op Pain Management:    Induction: Intravenous  PONV Risk Score and Plan: 2 and Ondansetron, Dexamethasone and Midazolam  Airway Management Planned: Oral ETT  Additional Equipment: None  Intra-op Plan:   Post-operative Plan: Extubation in OR  Informed Consent: I have reviewed the patients History and Physical, chart, labs and discussed the procedure including the risks, benefits and alternatives for the proposed anesthesia with the patient or authorized representative who has indicated his/her understanding and acceptance.   Patient has DNR.  Discussed DNR with patient and Suspend DNR.   Dental advisory given  Plan Discussed with: CRNA  Anesthesia Plan Comments:       Anesthesia Quick Evaluation

## 2021-02-25 NOTE — Transfer of Care (Signed)
Immediate Anesthesia Transfer of Care Note  Patient: Andres Richardson  Procedure(s) Performed: REPEAT WASHOUT OF PERINEUM (Perineum) LAPAROSCOPIC DIVERTING SIGMOID COLOSTOMY (Abdomen)  Patient Location: PACU  Anesthesia Type:General  Level of Consciousness: awake, alert , oriented and patient cooperative  Airway & Oxygen Therapy: Patient Spontanous Breathing and Patient connected to face mask oxygen  Post-op Assessment: Report given to RN and Post -op Vital signs reviewed and stable  Post vital signs: Reviewed and stable  Last Vitals:  Vitals Value Taken Time  BP 101/51 02/25/21 1230  Temp    Pulse 94 02/25/21 1232  Resp 21 02/25/21 1232  SpO2 98 % 02/25/21 1232  Vitals shown include unvalidated device data.  Last Pain:  Vitals:   02/25/21 0745  TempSrc:   PainSc: 0-No pain      Patients Stated Pain Goal: 0 (02/23/21 0400)  Complications: No notable events documented.

## 2021-02-25 NOTE — Progress Notes (Signed)
Pharmacy Antibiotic Note  Andres Richardson is a 55 y.o. male admitted on 02/22/2021 with DKA found to have fournier's gangrene.  S/p debridement on 7/12 and loop sigmoid colostomy 7/13. Pharmacy has been consulted to dose vancomycin and zosyn, clindamycin per MD.  02/25/2021 - day #2 full abx - Tm 100.8 - WBC 10.5, improved - SCr 1.26, increased  Plan: Decrease vancomycin to 750mg  q12h for rising SCr (est AUC 468, Scr 1.26) Continue Zosyn 3.375g IV Q8H infused over 4hrs. Clinda per MD Monitor SCr daily F/u cultures taken in OR  Height: 5\' 11"  (180.3 cm) Weight: 101.7 kg (224 lb 3.3 oz) IBW/kg (Calculated) : 75.3  Temp (24hrs), Avg:99.5 F (37.5 C), Min:98 F (36.7 C), Max:100.8 F (38.2 C)  Recent Labs  Lab 02/22/21 1118 02/22/21 1155 02/23/21 0742 02/23/21 0956 02/23/21 1419 02/24/21 0259 02/24/21 0636 02/25/21 0300  WBC 13.2*  --   --  12.6*  --   --  12.2* 10.5  CREATININE 1.41*   < > 1.34*  --  1.08 1.09 1.10 1.26*   < > = values in this interval not displayed.     Estimated Creatinine Clearance: 80.5 mL/min (A) (by C-G formula based on SCr of 1.26 mg/dL (H)).    Allergies  Allergen Reactions   Adhesive [Tape] Rash    Antimicrobials this admission: 7/11 vanc >> 7/12 zosyn >> 7/12 clinda >>  Dose adjustments this admission: 7/13 decrease vancomycin from 1g to 750mg  q12h for rising SCr  Microbiology results: 7/10 MRSA PCR: neg 7/11 BCx: ngtd 7/12 Bcx: ngtd 7/12 perineum abscess: pending - AFB: sent - Fungus: sent  Thank you for allowing pharmacy to be a part of this patient's care.  9/11, PharmD, BCPS Pharmacy: [phone] 02/25/2021, 1:50 PM

## 2021-02-25 NOTE — Progress Notes (Signed)
Day of Surgery   Chief Complaint/Subjective: Pain minimal  Review of Systems See above, otherwise other systems negative   Objective: Vital signs in last 24 hours: Temp:  [97.9 F (36.6 C)-100.8 F (38.2 C)] 99.4 F (37.4 C) (07/13 0826) Pulse Rate:  [97-111] 98 (07/13 0826) Resp:  [15-26] 15 (07/13 0826) BP: (95-151)/(60-88) 127/73 (07/13 0826) SpO2:  [95 %-99 %] 98 % (07/13 0826) Last BM Date: 02/20/21 Intake/Output from previous day: 07/12 0701 - 07/13 0700 In: 3119.2 [P.O.:500; I.V.:1662.1; IV Piggyback:957.1] Out: 1550 [Urine:1350; Blood:200] Intake/Output this shift: No intake/output data recorded.  PE: Gen: NAD Resp: nonlabored Card: RRR Abd: soft, Nt, ND  Lab Results:  Recent Labs    02/24/21 0636 02/25/21 0300  WBC 12.2* 10.5  HGB 11.8* 10.8*  HCT 37.7* 33.6*  PLT 256 222   BMET Recent Labs    02/24/21 0636 02/25/21 0300  NA 144 140  K 3.5 3.3*  CL 109 107  CO2 22 23  GLUCOSE 197* 190*  BUN 18 24*  CREATININE 1.10 1.26*  CALCIUM 8.1* 7.2*   PT/INR No results for input(s): LABPROT, INR in the last 72 hours. CMP     Component Value Date/Time   NA 140 02/25/2021 0300   K 3.3 (L) 02/25/2021 0300   CL 107 02/25/2021 0300   CO2 23 02/25/2021 0300   GLUCOSE 190 (H) 02/25/2021 0300   BUN 24 (H) 02/25/2021 0300   CREATININE 1.26 (H) 02/25/2021 0300   CALCIUM 7.2 (L) 02/25/2021 0300   PROT 7.7 02/22/2021 1118   ALBUMIN 3.3 (L) 02/22/2021 1118   AST 10 (L) 02/22/2021 1118   ALT 10 02/22/2021 1118   ALKPHOS 80 02/22/2021 1118   BILITOT 1.4 (H) 02/22/2021 1118   GFRNONAA >60 02/25/2021 0300   GFRAA >60 05/14/2019 0317   Lipase     Component Value Date/Time   LIPASE 193 (H) 05/10/2019 1946    Studies/Results: CT ABDOMEN PELVIS W CONTRAST  Result Date: 02/23/2021 CLINICAL DATA:  Abdominal pain with nausea and vomiting EXAM: CT ABDOMEN AND PELVIS WITH CONTRAST TECHNIQUE: Multidetector CT imaging of the abdomen and pelvis was performed  using the standard protocol following bolus administration of intravenous contrast. CONTRAST:  19mL OMNIPAQUE IOHEXOL 350 MG/ML SOLN COMPARISON:  05/04/2017 FINDINGS: Lower chest: Mild emphysematous changes are noted. No focal infiltrate or sizable effusion is seen. Hepatobiliary: Fatty infiltration of the liver is noted. The gallbladder is well distended. Dependent density is seen which may be related to gallbladder sludge or small stones. Pancreas: Unremarkable. No pancreatic ductal dilatation or surrounding inflammatory changes. Spleen: Normal in size without focal abnormality. Adrenals/Urinary Tract: Adrenal glands are within normal limits. Kidneys are well visualized bilaterally. Normal enhancement pattern is seen bilaterally. No renal calculi or obstructive changes are noted. Delayed images demonstrate normal excretion of contrast material. Ureters are within normal limits. Bladder is well distended. Stomach/Bowel: Colon is well visualized without obstructive or inflammatory changes. Mild retained fecal material is noted within the right colon consistent with a degree of constipation. The appendix is not well visualized and may have been surgically removed. Small bowel shows no obstructive changes. The stomach is within normal limits. Vascular/Lymphatic: Aortic atherosclerosis. Few small reactive lymph nodes are noted in the right iliac chain. Reproductive: Prostate is unremarkable. Other: There is subcutaneous inflammatory change in the region of the perineum extending into both buttocks superiorly slightly greater on the left than the right. A small focal subcutaneous fluid collection is noted which measures 2.7 x  2.2 cm best noted on image number 12 of series 4 in the medial aspect of the right buttock along the intergluteal cleft. The subcutaneous air extends superiorly adjacent to the right inguinal canal. No other definitive fluid collection is seen. These changes are most consistent with Fournier's  gangrene. Additionally some inflammatory change extends along the right lateral pelvic wall posteriorly with some reactive lymphadenopathy identified. No free fluid is noted. Musculoskeletal: No acute bony abnormality is noted. IMPRESSION: Changes in the subcutaneous tissues of the perineum/bilateral buttocks and extending into the right lateral pelvic wall consistent with Fournier's gangrene. Some thickening of the operator internus muscle on the right is noted. A small subcutaneous fluid collection is noted in the medial aspect of the right buttocks. Mild changes of colonic constipation in the right colon. Dependent density in the gallbladder likely representing sludge or small gallstones. Mild emphysematous changes. These results will be called to the ordering clinician or representative by the Radiologist Assistant, and communication documented in the PACS or [employer]. Electronically Signed   By: Alcide Clever M.D.   On: 02/23/2021 20:01    Anti-infectives: Anti-infectives (From admission, onward)    Start     Dose/Rate Route Frequency Ordered Stop   02/24/21 1000  [MAR Hold]  vancomycin (VANCOREADY) IVPB 1000 mg/200 mL        (MAR Hold since Wed 02/25/2021 at 0834.Hold Reason: Transfer to a Procedural area)   1,000 mg 200 mL/hr over 60 Minutes Intravenous Every 12 hours 02/23/21 2215     02/23/21 2215  [MAR Hold]  clindamycin (CLEOCIN) IVPB 600 mg        (MAR Hold since Wed 02/25/2021 at 0834.Hold Reason: Transfer to a Procedural area)   600 mg 100 mL/hr over 30 Minutes Intravenous Every 8 hours 02/23/21 2122     02/23/21 2200  vancomycin (VANCOREADY) IVPB 2000 mg/400 mL        2,000 mg 200 mL/hr over 120 Minutes Intravenous  Once 02/23/21 2133 02/23/21 2353   02/23/21 2200  [MAR Hold]  piperacillin-tazobactam (ZOSYN) IVPB 3.375 g        (MAR Hold since Wed 02/25/2021 at 0834.Hold Reason: Transfer to a Procedural area)   3.375 g 12.5 mL/hr over 4 Hours Intravenous Every 8 hours 02/23/21  2133         Assessment/Plan  s/p Procedure(s): REPEAT INCISION AND DRAINAGE LAPAROSCOPIC DIVERTED COLOSTOMY 02/25/2021  -OR today for look at exploration with colostomy diversion  FEN - NPO VTE - lovenox ID - zosyn, vancomycin Disposition - inpatient   LOS: 3 days   [name] , MD [facility] 02/25/2021, 8:44 AM Please see Amion for pager number during day hours 7:00am-4:30pm or 7:00am -11:30am on weekends

## 2021-02-25 NOTE — Anesthesia Procedure Notes (Signed)
Procedure Name: Intubation Date/Time: 02/25/2021 10:46 AM Performed by: Yolonda Kida, CRNA Pre-anesthesia Checklist: Patient identified, Emergency Drugs available, Suction available and Patient being monitored Patient Re-evaluated:Patient Re-evaluated prior to induction Oxygen Delivery Method: Circle system utilized Preoxygenation: Pre-oxygenation with 100% oxygen Induction Type: IV induction Ventilation: Mask ventilation without difficulty Laryngoscope Size: Miller and 3 Grade View: Grade I Tube type: Oral Tube size: 7.5 mm Number of attempts: 1 Airway Equipment and Method: Stylet Placement Confirmation: ETT inserted through vocal cords under direct vision, positive ETCO2 and breath sounds checked- equal and bilateral Secured at: 22 cm Tube secured with: Tape Dental Injury: Teeth and Oropharynx as per pre-operative assessment

## 2021-02-25 NOTE — Op Note (Signed)
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  Asst: none  Anesthesia: general  Indications for procedure: QASIM DIVELEY is a 55 y.o. year old male with soft tissue necrotizing infection treated with debridement yesterday. He presents for reevaluation and fecal diversion.  Description of procedure: The patient was brought into the operative suite. Anesthesia was administered with General endotracheal anesthesia. WHO checklist was applied. The patient was then placed in lithotomy position. The area was prepped and draped in the usual sterile fashion.  Next, a small right subcostal incision was made. A 101mm trocar was used to gain access to the peritoneal cavity by optical entry technique. Pneumoperitoneum was applied with a high flow and low pressure. The laparoscope was reinserted to confirm position.  There were lots of filmy adhesions in the right abdomen that were taken down with laparoscope blunt dissection to identify a safe space for a second 5 mm trocar in the right mid abdomen.  Marcaine was injected in the TAP plane on the right. There was a large mid abdominal hernia from previous laparotomy with small intestine adhered to the inferior edge. These adhesions were taken down to visualize the sigmoid colon. The sigmoid colon was freed from adhesions to the abdominal wall and white line of Toldt incised sharply. Blunt and sharp dissection of the adhesions was performed for 35 minutes. The sigmoid colon was mobile enough for loop colostomy. Marcaine was inject in the TAP on the left. Next a incision was made in the left abdomen cautery and blunt dissection used to dissect down to the fascia and a cruciate incision was made through the fascia at the rectus in the sigmoid colon was  brought out of this hole and colostomy was created with 3 to 4 cm of Brooking on the proximal end using 3-0 Vicryl.  A ostomy rod was placed under the sigmoid colon to keep it above the fascia and this rod was sutured in place with 2-0 silk in 4 positions.  Ostomy appliance was applied.  Abdomen was desufflated trochars removed and all ports were closed with staples and dressing placed.  Next attention was turned towards the perineum.  There was some gray filmy liquid along the anus and over the right posterior gluteal area.  Blunt dissection with laparotomy sponge was performed and underlying there was no gross necrosis.  Area was irrigated with warm saline.  And a new saline and Kerlix was placed for dressing.  Patient awoke from anesthesia was brought to PACU in stable condition.  All counts were correct.  Findings: sigmoid loop colostomy, small amount gray fluid along anus and right gluteus without further necrosis  Specimen: none  Implant: ostomy appliance and ostomy rod   Blood loss: 20  Local anesthesia:  20 ml marcaine   Complications: none  Feliciana Rossetti, M.D. General, Bariatric, & Minimally Invasive Surgery [facility]

## 2021-02-25 NOTE — Anesthesia Postprocedure Evaluation (Signed)
Anesthesia Post Note  Patient: Andres Richardson  Procedure(s) Performed: REPEAT WASHOUT OF PERINEUM (Perineum) LAPAROSCOPIC DIVERTING SIGMOID COLOSTOMY (Abdomen)     Patient location during evaluation: PACU Anesthesia Type: General Level of consciousness: awake and alert, patient cooperative and oriented Pain management: pain level controlled Vital Signs Assessment: post-procedure vital signs reviewed and stable Respiratory status: spontaneous breathing, nonlabored ventilation, respiratory function stable and patient connected to nasal cannula oxygen Cardiovascular status: blood pressure returned to baseline and stable Postop Assessment: no apparent nausea or vomiting Anesthetic complications: no   No notable events documented.  Last Vitals:  Vitals:   02/25/21 1245 02/25/21 1309  BP: 122/65   Pulse: 93 91  Resp: 16 13  Temp:  36.7 C  SpO2: 94% 96%    Last Pain:  Vitals:   02/25/21 1331  TempSrc:   PainSc: 1                  Kenyotta Dorfman,E. Karyna Bessler

## 2021-02-25 NOTE — Progress Notes (Signed)
PROGRESS NOTE    Andres Richardson  QZE:[id number] DOB: [birth year] DOA: 02/22/2021 PCP: [name], MD   No chief complaint on file.  Brief Narrative: 55 year old male with diabetes to with diabetes admitted with intractable nausea vomiting, DKA managed with insulin drip on 7/10.  Patient however demonstrated ongoing fever and vomiting CT abdomen showed perirectal abscess for fournier gangrene surgery, urology was consulted. Patient is status post debridement by general surgery and urology 7/12  Subjective:  Overnight low-grade fever C/o nausea, going to OR again today. No other complaints   Assessment & Plan:  Fournier's gangrene: General surgery urology following s/p debridement 7/12.  On broad-spectrum antibiotic vancomycin, Zosyn and clindamycin.  Low-grade fever overnight, leukocytosis resolved.  Follow-up aerobic anaerobic fungal OR culture from 7/12. Going to OR again today. Cont to monitor Recent Labs  Lab 02/22/21 1118 02/23/21 0956 02/24/21 0636 02/25/21 0300  WBC 13.2* 12.6* 12.2* 10.5     DKA Type 2 diabetes mellitus, DKA is resolved.  Diabetes poorly controlled, a1c 9.5 Continue Levemir, SSI diabetic diet, DM coordinator following Recent Labs  Lab 02/24/21 1513 02/24/21 1917 02/24/21 2337 02/25/21 0351 02/25/21 0723  GLUCAP 269* 249* 196* 173* 166*   Nausea- add ppi, cont prn zofran  AKI creatinine slightly up overnight monitor continue hydration Recent Labs  Lab 02/23/21 0312 02/23/21 0742 02/23/21 1419 02/24/21 0259 02/24/21 0636 02/25/21 0300  BUN 25* 24* 20  --  18 24*  CREATININE 1.14 1.34* 1.08 1.09 1.10 1.26*    Hypokalemia we will replete Hypernatremia monitor Hypertension, continue as needed meds Tobacco abuse continue nicotine patch  Morbid obesity BMI more than 30 will benefit with weight loss healthy lifestyle.   Diet Order             Diet NPO time specified Except for: Sips with Meds  Diet effective midnight                    Patient's Body mass index is 31.27 kg/m. DVT prophylaxis: SCD. Lovenox once okay w Surgery Code Status:   Code Status: DNR  Family Communication: plan of care discussed with patient at bedside.  Status is: Inpatient Remains inpatient appropriate because:IV treatments appropriate due to intensity of illness or inability to take PO and Inpatient level of care appropriate due to severity of illness Dispo: The patient is from: Home              Anticipated d/c is to: Home              Patient currently is not medically stable to d/c.   Difficult to place patient No Unresulted Labs (From admission, onward)     Start     Ordered   02/24/21 0909  Fungus Culture With Stain  RELEASE UPON ORDERING,   TIMED       Comments: Specimen A: Pre-op diagnosis: fournier gangrene    02/24/21 0909   02/24/21 0909  Aerobic/Anaerobic Culture w Gram Stain (surgical/deep wound)  RELEASE UPON ORDERING,   TIMED       Comments: Specimen A: Pre-op diagnosis: fournier gangrene    02/24/21 0909   02/24/21 0909  Acid Fast Culture with reflexed sensitivities  RELEASE UPON ORDERING,   TIMED       Comments: Specimen A: Pre-op diagnosis: fournier gangrene    02/24/21 0909   02/24/21 0909  Acid Fast Smear (AFB)  RELEASE UPON ORDERING,   TIMED       Comments: Specimen A:  Pre-op diagnosis: fournier gangrene    02/24/21 0909   02/24/21 0500  Creatinine, serum  Daily,   R     Question:  Specimen collection method  Answer:  Lab=Lab collect   02/23/21 2215   02/23/21 2123  Culture, blood (routine x 2)  BLOOD CULTURE X 2,   R      02/23/21 2122            Medications reviewed:  Scheduled Meds:  Chlorhexidine Gluconate Cloth  6 each Topical Q0600   cyanocobalamin  1,000 mcg Intramuscular Daily   insulin aspart  0-15 Units Subcutaneous Q4H   insulin detemir  12 Units Subcutaneous BID   mouth rinse  15 mL Mouth Rinse BID   nicotine  21 mg Transdermal Daily   pantoprazole (PROTONIX) IV  40 mg  Intravenous Once   Continuous Infusions:  sodium chloride Stopped (02/25/21 0610)   clindamycin (CLEOCIN) IV Stopped (02/25/21 0641)   piperacillin-tazobactam 12.5 mL/hr at 02/25/21 0700   promethazine (PHENERGAN) injection (IM or IVPB) Stopped (02/25/21 0444)   vancomycin Stopped (02/24/21 2319)    Consultants:see note  Procedures:see note  Antimicrobials: Anti-infectives (From admission, onward)    Start     Dose/Rate Route Frequency Ordered Stop   02/24/21 1000  vancomycin (VANCOREADY) IVPB 1000 mg/200 mL        1,000 mg 200 mL/hr over 60 Minutes Intravenous Every 12 hours 02/23/21 2215     02/23/21 2215  clindamycin (CLEOCIN) IVPB 600 mg        600 mg 100 mL/hr over 30 Minutes Intravenous Every 8 hours 02/23/21 2122     02/23/21 2200  vancomycin (VANCOREADY) IVPB 2000 mg/400 mL        2,000 mg 200 mL/hr over 120 Minutes Intravenous  Once 02/23/21 2133 02/23/21 2353   02/23/21 2200  piperacillin-tazobactam (ZOSYN) IVPB 3.375 g        3.375 g 12.5 mL/hr over 4 Hours Intravenous Every 8 hours 02/23/21 2133        Culture/Microbiology No results found for: SDES, SPECREQUEST, CULT, REPTSTATUS  Other culture-see note  Objective: Vitals: Today's Vitals   02/25/21 0000 02/25/21 0400 02/25/21 0600 02/25/21 0745  BP: 95/65  (!) 148/88   Pulse: (!) 106  97 99  Resp: (!) 22     Temp: 99.6 F (37.6 C) 98.8 F (37.1 C)    TempSrc: Axillary Axillary    SpO2: 99%  95% 95%  Weight:      Height:      PainSc: 0-No pain   0-No pain    Intake/Output Summary (Last 24 hours) at 02/25/2021 0825 Last data filed at 02/25/2021 0700 Gross per 24 hour  Intake 3119.22 ml  Output 1550 ml  Net 1569.22 ml   Filed Weights   02/22/21 1509 02/23/21 0400 02/24/21 0751  Weight: 101.1 kg 101.7 kg 101.7 kg   Weight change:   Intake/Output from previous day: 07/12 0701 - 07/13 0700 In: 3119.2 [P.O.:500; I.V.:1662.1; IV Piggyback:957.1] Out: 1550 [Urine:1350; Blood:200] Intake/Output  this shift: No intake/output data recorded. Filed Weights   02/22/21 1509 02/23/21 0400 02/24/21 0751  Weight: 101.1 kg 101.7 kg 101.7 kg    Examination: General exam: AAOx3, weak, pleasant HEENT:Oral mucosa moist, Ear/Nose WNL grossly,dentition normal. Respiratory system: bilaterally diminished, no crackles, no use of accessory muscle, non tender. Cardiovascular system: S1 & S2 +,No JVD. Gastrointestinal system: Abdomen soft, NT,ND, BS+. Nervous System:Alert, awake, moving extremities Extremities: no edema, distal peripheral pulses palpable.  Skin: No rashes,no icterus. MSK: Normal muscle bulk,tone, power  Data Reviewed: I have personally reviewed following labs and imaging studies CBC: Recent Labs  Lab 02/22/21 1118 02/22/21 1155 02/23/21 0956 02/24/21 0636 02/25/21 0300  WBC 13.2*  --  12.6* 12.2* 10.5  NEUTROABS 11.4*  --   --  10.0* 8.6*  HGB 13.2 13.3 10.9* 11.8* 10.8*  HCT 40.5 39.0 33.3* 37.7* 33.6*  MCV 89.0  --  89.3 92.6 91.1  PLT 286  --  266 256 222   Basic Metabolic Panel: Recent Labs  Lab 02/23/21 0312 02/23/21 0742 02/23/21 1419 02/24/21 0259 02/24/21 0636 02/25/21 0300  NA 147* 146* 145  --  144 140  K 3.6 5.0 3.6  --  3.5 3.3*  CL 110 116* 110  --  109 107  CO2 27 20* 25  --  22 23  GLUCOSE 165* 165* 177*  --  197* 190*  BUN 25* 24* 20  --  18 24*  CREATININE 1.14 1.34* 1.08 1.09 1.10 1.26*  CALCIUM 8.6* 8.6* 8.6*  --  8.1* 7.2*   GFR: Estimated Creatinine Clearance: 80.5 mL/min (A) (by C-G formula based on SCr of 1.26 mg/dL (H)). Liver Function Tests: Recent Labs  Lab 02/22/21 1118  AST 10*  ALT 10  ALKPHOS 80  BILITOT 1.4*  PROT 7.7  ALBUMIN 3.3*   No results for input(s): LIPASE, AMYLASE in the last 168 hours. No results for input(s): AMMONIA in the last 168 hours. Coagulation Profile: No results for input(s): INR, PROTIME in the last 168 hours. Cardiac Enzymes: No results for input(s): CKTOTAL, CKMB, CKMBINDEX, TROPONINI in  the last 168 hours. BNP (last 3 results) No results for input(s): PROBNP in the last 8760 hours. HbA1C: Recent Labs    02/22/21 1638  HGBA1C 9.5*   CBG: Recent Labs  Lab 02/24/21 1513 02/24/21 1917 02/24/21 2337 02/25/21 0351 02/25/21 0723  GLUCAP 269* 249* 196* 173* 166*   Lipid Profile: No results for input(s): CHOL, HDL, LDLCALC, TRIG, CHOLHDL, LDLDIRECT in the last 72 hours. Thyroid Function Tests: No results for input(s): TSH, T4TOTAL, FREET4, T3FREE, THYROIDAB in the last 72 hours. Anemia Panel: Recent Labs    02/23/21 0956  VITAMINB12 158*  FOLATE 7.8   Sepsis Labs: No results for input(s): PROCALCITON, LATICACIDVEN in the last 168 hours.  Recent Results (from the past 240 hour(s))  Resp Panel by RT-PCR (Flu A&B, Covid) Nasopharyngeal Swab     Status: None   Collection Time: 02/22/21  1:25 PM   Specimen: Nasopharyngeal Swab; Nasopharyngeal(NP) swabs in vial transport medium  Result Value Ref Range Status   SARS Coronavirus 2 by RT PCR NEGATIVE NEGATIVE Final    Comment: (NOTE) SARS-CoV-2 target nucleic acids are NOT DETECTED.  The SARS-CoV-2 RNA is generally detectable in upper respiratory specimens during the acute phase of infection. The lowest concentration of SARS-CoV-2 viral copies this assay can detect is 138 copies/mL. A negative result does not preclude SARS-Cov-2 infection and should not be used as the sole basis for treatment or other patient management decisions. A negative result may occur with  improper specimen collection/handling, submission of specimen other than nasopharyngeal swab, presence of viral mutation(s) within the areas targeted by this assay, and inadequate number of viral copies(<138 copies/mL). A negative result must be combined with clinical observations, patient history, and epidemiological information. The expected result is Negative.  Fact Sheet for Patients:  BloggerCourse.com  Fact Sheet for  Healthcare Providers:  SeriousBroker.it  This test  is no t yet approved or cleared by the [place] and  has been authorized for detection and/or diagnosis of SARS-CoV-2 by FDA under an Emergency Use Authorization (EUA). This EUA will remain  in effect (meaning this test can be used) for the duration of the COVID-19 declaration under Section 564(b)(1) of the Act, 21 U.S.C.section 360bbb-3(b)(1), unless the authorization is terminated  or revoked sooner.       Influenza A by PCR NEGATIVE NEGATIVE Final   Influenza B by PCR NEGATIVE NEGATIVE Final    Comment: (NOTE) The Xpert Xpress SARS-CoV-2/FLU/RSV plus assay is intended as an aid in the diagnosis of influenza from Nasopharyngeal swab specimens and should not be used as a sole basis for treatment. Nasal washings and aspirates are unacceptable for Xpert Xpress SARS-CoV-2/FLU/RSV testing.  Fact Sheet for Patients: BloggerCourse.com  Fact Sheet for Healthcare Providers: SeriousBroker.it  This test is not yet approved or cleared by the [place] FDA and has been authorized for detection and/or diagnosis of SARS-CoV-2 by FDA under an Emergency Use Authorization (EUA). This EUA will remain in effect (meaning this test can be used) for the duration of the COVID-19 declaration under Section 564(b)(1) of the Act, 21 U.S.C. section 360bbb-3(b)(1), unless the authorization is terminated or revoked.  Performed at [facility], [address], [place], [place] [address]   MRSA Next Gen by PCR, Nasal     Status: None   Collection Time: 02/22/21  3:10 PM   Specimen: Nasal Mucosa; Nasal Swab  Result Value Ref Range Status   MRSA by PCR Next Gen NOT DETECTED NOT DETECTED Final    Comment: (NOTE) The GeneXpert MRSA Assay (FDA approved for NASAL specimens only), is one component of a comprehensive MRSA colonization  surveillance program. It is not intended to diagnose MRSA infection nor to guide or monitor treatment for MRSA infections. Test performance is not FDA approved in patients less than 2 years old. Performed at [facility], [address] [place], [place] [address]      Radiology Studies: CT ABDOMEN PELVIS W CONTRAST  Result Date: 02/23/2021 CLINICAL DATA:  Abdominal pain with nausea and vomiting EXAM: CT ABDOMEN AND PELVIS WITH CONTRAST TECHNIQUE: Multidetector CT imaging of the abdomen and pelvis was performed using the standard protocol following bolus administration of intravenous contrast. CONTRAST:  37mL OMNIPAQUE IOHEXOL 350 MG/ML SOLN COMPARISON:  05/04/2017 FINDINGS: Lower chest: Mild emphysematous changes are noted. No focal infiltrate or sizable effusion is seen. Hepatobiliary: Fatty infiltration of the liver is noted. The gallbladder is well distended. Dependent density is seen which may be related to gallbladder sludge or small stones. Pancreas: Unremarkable. No pancreatic ductal dilatation or surrounding inflammatory changes. Spleen: Normal in size without focal abnormality. Adrenals/Urinary Tract: Adrenal glands are within normal limits. Kidneys are well visualized bilaterally. Normal enhancement pattern is seen bilaterally. No renal calculi or obstructive changes are noted. Delayed images demonstrate normal excretion of contrast material. Ureters are within normal limits. Bladder is well distended. Stomach/Bowel: Colon is well visualized without obstructive or inflammatory changes. Mild retained fecal material is noted within the right colon consistent with a degree of constipation. The appendix is not well visualized and may have been surgically removed. Small bowel shows no obstructive changes. The stomach is within normal limits. Vascular/Lymphatic: Aortic atherosclerosis. Few small reactive lymph nodes are noted in the right iliac chain. Reproductive: Prostate is  unremarkable. Other: There is subcutaneous inflammatory change in the region of the perineum extending  into both buttocks superiorly slightly greater on the left than the right. A small focal subcutaneous fluid collection is noted which measures 2.7 x 2.2 cm best noted on image number 12 of series 4 in the medial aspect of the right buttock along the intergluteal cleft. The subcutaneous air extends superiorly adjacent to the right inguinal canal. No other definitive fluid collection is seen. These changes are most consistent with Fournier's gangrene. Additionally some inflammatory change extends along the right lateral pelvic wall posteriorly with some reactive lymphadenopathy identified. No free fluid is noted. Musculoskeletal: No acute bony abnormality is noted. IMPRESSION: Changes in the subcutaneous tissues of the perineum/bilateral buttocks and extending into the right lateral pelvic wall consistent with Fournier's gangrene. Some thickening of the operator internus muscle on the right is noted. A small subcutaneous fluid collection is noted in the medial aspect of the right buttocks. Mild changes of colonic constipation in the right colon. Dependent density in the gallbladder likely representing sludge or small gallstones. Mild emphysematous changes. These results will be called to the ordering clinician or representative by the Radiologist Assistant, and communication documented in the PACS or [employer]. Electronically Signed   By: Alcide Clever M.D.   On: 02/23/2021 20:01     LOS: 3 days   Lanae Boast, MD [facility]  02/25/2021, 8:25 AM

## 2021-02-25 NOTE — Consult Note (Signed)
WOC consulted in the hallway in ICU for new diverting ostomy.  Patient has Fourniers gangrene and needs diverting ostomy.  Dr. Francena Hanly noted patient to go to surgery around 10am. Discussed with WOC Nurse in the hallway, however I was tied up with another new COVID + patient with teaching family members present on the unit.  10 mins later attempted to see patient for marking, he has been taken to surgery already.   I had suggested marking as best practice for placement, however I was not able to see patient.  Made surgeon aware.   Laurin Paulo [facility], CNS, [employer] [phone]

## 2021-02-26 ENCOUNTER — Encounter (HOSPITAL_COMMUNITY): Payer: Self-pay | Admitting: General Surgery

## 2021-02-26 LAB — CBC
HCT: 31.3 % — ABNORMAL LOW (ref 39.0–52.0)
Hemoglobin: 10.1 g/dL — ABNORMAL LOW (ref 13.0–17.0)
MCH: 29.6 pg (ref 26.0–34.0)
MCHC: 32.3 g/dL (ref 30.0–36.0)
MCV: 91.8 fL (ref 80.0–100.0)
Platelets: 252 10*3/uL (ref 150–400)
RBC: 3.41 MIL/uL — ABNORMAL LOW (ref 4.22–5.81)
RDW: 15.2 % (ref 11.5–15.5)
WBC: 10.8 10*3/uL — ABNORMAL HIGH (ref 4.0–10.5)
nRBC: 0 % (ref 0.0–0.2)

## 2021-02-26 LAB — BASIC METABOLIC PANEL
Anion gap: 11 (ref 5–15)
BUN: 25 mg/dL — ABNORMAL HIGH (ref 6–20)
CO2: 21 mmol/L — ABNORMAL LOW (ref 22–32)
Calcium: 7.1 mg/dL — ABNORMAL LOW (ref 8.9–10.3)
Chloride: 107 mmol/L (ref 98–111)
Creatinine, Ser: 1.24 mg/dL (ref 0.61–1.24)
GFR, Estimated: >60 mL/min (ref >60)
Glucose, Bld: 184 mg/dL — ABNORMAL HIGH (ref 70–99)
Potassium: 3.4 mmol/L — ABNORMAL LOW (ref 3.5–5.1)
Sodium: 139 mmol/L (ref 135–145)

## 2021-02-26 LAB — GLUCOSE, CAPILLARY
Glucose-Capillary: 151 mg/dL — ABNORMAL HIGH (ref 70–99)
Glucose-Capillary: 130 mg/dL — ABNORMAL HIGH (ref 70–99)
Glucose-Capillary: 134 mg/dL — ABNORMAL HIGH (ref 70–99)
Glucose-Capillary: 150 mg/dL — ABNORMAL HIGH (ref 70–99)
Glucose-Capillary: 110 mg/dL — ABNORMAL HIGH (ref 70–99)
Glucose-Capillary: 123 mg/dL — ABNORMAL HIGH (ref 70–99)

## 2021-02-26 MED ORDER — JUVEN PO PACK
1.0000 | PACK | Freq: Two times a day (BID) | ORAL | Status: DC
  Administered 2021-02-26 – 2021-03-10 (×22): 1 via ORAL
  Filled 2021-02-26 (×24): qty 1

## 2021-02-26 MED ORDER — BOOST / RESOURCE BREEZE PO LIQD CUSTOM
1.0000 | Freq: Two times a day (BID) | ORAL | Status: DC
  Administered 2021-02-26 – 2021-03-04 (×4): 1 via ORAL

## 2021-02-26 MED ORDER — PROSOURCE PLUS PO LIQD
30.0000 mL | Freq: Two times a day (BID) | ORAL | Status: DC
  Administered 2021-02-26 – 2021-02-28 (×4): 30 mL via ORAL
  Filled 2021-02-26 (×8): qty 30

## 2021-02-26 MED ORDER — METHOCARBAMOL 500 MG PO TABS
500.0000 mg | ORAL_TABLET | Freq: Three times a day (TID) | ORAL | Status: DC
  Administered 2021-02-26 – 2021-03-10 (×37): 500 mg via ORAL
  Filled 2021-02-26 (×37): qty 1

## 2021-02-26 MED ORDER — ZINC SULFATE 220 (50 ZN) MG PO CAPS
220.0000 mg | ORAL_CAPSULE | Freq: Every day | ORAL | Status: DC
  Administered 2021-02-26 – 2021-03-07 (×10): 220 mg via ORAL
  Filled 2021-02-26 (×11): qty 1

## 2021-02-26 MED ORDER — ASCORBIC ACID 500 MG PO TABS
500.0000 mg | ORAL_TABLET | Freq: Two times a day (BID) | ORAL | Status: DC
  Administered 2021-02-26 – 2021-03-10 (×25): 500 mg via ORAL
  Filled 2021-02-26 (×25): qty 1

## 2021-02-26 MED ORDER — ACETAMINOPHEN 500 MG PO TABS
1000.0000 mg | ORAL_TABLET | Freq: Four times a day (QID) | ORAL | Status: DC
  Administered 2021-02-26 – 2021-03-10 (×39): 1000 mg via ORAL
  Filled 2021-02-26 (×44): qty 2

## 2021-02-26 MED ORDER — ADULT MULTIVITAMIN W/MINERALS CH
1.0000 | ORAL_TABLET | Freq: Every day | ORAL | Status: DC
  Administered 2021-02-26 – 2021-03-10 (×13): 1 via ORAL
  Filled 2021-02-26 (×13): qty 1

## 2021-02-26 MED ORDER — OXYCODONE HCL 5 MG PO TABS
5.0000 mg | ORAL_TABLET | ORAL | Status: DC | PRN
  Administered 2021-02-26 – 2021-02-28 (×7): 10 mg via ORAL
  Administered 2021-02-28 – 2021-03-02 (×2): 5 mg via ORAL
  Administered 2021-03-02 – 2021-03-06 (×7): 10 mg via ORAL
  Administered 2021-03-06: 5 mg via ORAL
  Administered 2021-03-07 – 2021-03-10 (×7): 10 mg via ORAL
  Filled 2021-02-26 (×12): qty 2
  Filled 2021-02-26: qty 1
  Filled 2021-02-26 (×5): qty 2
  Filled 2021-02-26: qty 1
  Filled 2021-02-26 (×5): qty 2

## 2021-02-26 MED ORDER — ENOXAPARIN SODIUM 40 MG/0.4ML IJ SOSY
40.0000 mg | PREFILLED_SYRINGE | INTRAMUSCULAR | Status: DC
  Administered 2021-02-26 – 2021-03-10 (×13): 40 mg via SUBCUTANEOUS
  Filled 2021-02-26 (×13): qty 0.4

## 2021-02-26 NOTE — Progress Notes (Signed)
Initial Nutrition Assessment  DOCUMENTATION CODES:   Obesity unspecified  INTERVENTION:  - will order Boost Breeze BID, each supplement provides 250 kcal and 9 grams of protein. - will order 1 packet Juven BID, each packet provides 95 calories, 2.5 grams of protein (collagen), and 9.8 grams of carbohydrate (3 grams sugar); also contains 7 grams of L-arginine and L-glutamine, 300 mg vitamin C, 15 mg vitamin E, 1.2 mcg vitamin B-12, 9.5 mg zinc, 200 mg calcium, and 1.5 g  Calcium Beta-hydroxy-Beta-methylbutyrate to support wound healing. - will order 30 ml Prosource Plus BID, each supplement provides 100 kcal and 15 grams protein.  - will order 1 tablet multivitamin with minerals/day. - recommend 220 mg zinc sulfate/day and 500 mg ascorbic acid BID to aid in wound healing.   NUTRITION DIAGNOSIS:   Increased nutrient needs related to post-op healing, wound healing as evidenced by estimated needs.  GOAL:   Patient will meet greater than or equal to 90% of their needs  MONITOR:   PO intake, Supplement acceptance, Diet advancement, Labs, Weight trends, Skin  REASON FOR ASSESSMENT:   Diagnosis  ASSESSMENT:   55 year old male with medical history of DM, PAD, HTN, HLD, and microalbuminuria. He was admitted on 7/10 with intractable N/V and found to be in DKA. CT scan showed perirectal abscess and he was dx with Fournier's gangrene.  Unable to see patient x2 attempts. He is POD #2 I&D of perineum including skin, subcutaneous tissue, and fascia. POD #1 lap loop sigmoid colostomy.   He has not been seen by a Anna RD at any time in the past.  Weight on 7/12 was 224 lb, weight on 7/10 was 205 lb, and PTA the most recently documented weight was on 05/11/19 when he weighed 203 lb.  No information documented in the edema section of flow sheet.   Per notes: - DM poorly controlled PTA - plan for patient to remain on CLD until bowel function returns - AKI--resolved - no plan for return  to the OR   Labs reviewed; HgbA1c on 7/10: 9.5%, CBGs: 151, 130, 134 mg/dl, K: 3.4 mmol/l, BUN: 25 mg/dl, Ca: 7.1 mg/dl, Y10 on 1/75: 102 pg/ml. Medications reviewed; 1000 mcg IM cyanocobalamin/day, sliding scale novolog, 12 units levemir BID, 40 mg IV protonix/day.     NUTRITION - FOCUSED PHYSICAL EXAM:  Unable to complete at this time.  Diet Order:   Diet Order             Diet clear liquid Room service appropriate? Yes; Fluid consistency: Thin  Diet effective now                   EDUCATION NEEDS:   Not appropriate for education at this time  Skin:  Skin Assessment: Skin Integrity Issues: Skin Integrity Issues:: Incisions Incisions: perineum (7/12 and 7/13); abdomen (7/13 x2)  Last BM:  7/8 (2 days PTA), per documentation in flow sheet  Height:   Ht Readings from Last 1 Encounters:  02/24/21 5\' 11"  (1.803 m)    Weight:   Wt Readings from Last 1 Encounters:  02/24/21 101.7 kg      Estimated Nutritional Needs:  Kcal:  2350-2600 kcal Protein:  120-135 grams Fluid:  >/= 2.5 L/day       04/27/21, MS, RD, LDN, CNSC Inpatient Clinical Dietitian RD pager # available in [facility]  After hours/weekend pager # available in [facility]

## 2021-02-26 NOTE — Progress Notes (Addendum)
PROGRESS NOTE    Andres Richardson  BVQ:[id number] DOB: 1965/09/24 DOA: 02/22/2021 PCP: [name], MD   No chief complaint on file.  Brief Narrative: 55 year old male with diabetes to with diabetes admitted with intractable nausea vomiting, DKA managed with insulin drip on 7/10.  Patient however demonstrated ongoing fever and vomiting CT abdomen showed perirectal abscess for fournier gangrene surgery, urology was consulted. Patient is status post debridement by general surgery and urology 7/12- "laparoscopic creation of loop sigmoid colostomy, laparoscopic lysis of adhesions, perineal exam under anesthesia"  Subjective: Seen this morning is resting comfortably denies nausea vomiting. Left-sided colostomy , Foley in place    Assessment & Plan:  Fournier's gangrene: General surgery urology following- s/p debridement 7/12 and loop sigmoid colostomy, LOS by gen surgery 7/13. Cont on vancomycin, Zosyn and clindamycin.  Tmax 99.4. ,leukocytosis resolved.  OR culture 7/12- stain w/ abundant GPR, few GPC GNCB. Recent Labs  Lab 02/22/21 1118 02/23/21 0956 02/24/21 0636 02/25/21 0300 02/26/21 0249  WBC 13.2* 12.6* 12.2* 10.5 10.8*    DKA Type 2 diabetes mellitus-poorly controlled DKA is resolved.  Diabetes poorly controlled, a1c 9.5 Continue Levemir 12 u bid,SSI diabetic diet, DM coordinator following Recent Labs  Lab 02/25/21 1532 02/25/21 1947 02/25/21 2317 02/26/21 0327 02/26/21 0739  GLUCAP 280* 304* 272* 151* 130*   Nausea- cont prn zofran, PPI.  AKI -resolved  Recent Labs  Lab 02/23/21 0742 02/23/21 1419 02/24/21 0259 02/24/21 0636 02/25/21 0300 02/26/21 0249  BUN 24* 20  --  18 24* 25*  CREATININE 1.34* 1.08 1.09 1.10 1.26* 1.24  Hypokalemia 3.4 replete po. Hypernatremia resolved Hypertension, controlled. Tobacco abuse continue nicotine patch  Morbid obesity BMI more than 30 will benefit with weight loss healthy lifestyle.   Diet Order             Diet  clear liquid Room service appropriate? Yes; Fluid consistency: Thin  Diet effective now                   Patient's Body mass index is 31.27 kg/m. DVT prophylaxis: SCD. Lovenox once okay w Surgery Code Status:   Code Status: DNR  Family Communication: plan of care discussed with patient at bedside.  Status is: Inpatient Remains inpatient appropriate because:IV treatments appropriate due to intensity of illness or inability to take PO and Inpatient level of care appropriate due to severity of illness Dispo: The patient is from: Home              Anticipated d/c is to: To be decided.  Okay for transfer out of stepdown today.  PT OT once Foley out tomorrow              Patient currently is not medically stable to d/c.   Difficult to place patient No Unresulted Labs (From admission, onward)     Start     Ordered   02/26/21 0500  Basic metabolic panel  Daily,   R     Question:  Specimen collection method  Answer:  Lab=Lab collect   02/25/21 0827   02/26/21 0500  CBC  Daily,   R     Question:  Specimen collection method  Answer:  Lab=Lab collect   02/25/21 0827   02/24/21 0909  Fungus Culture With Stain  RELEASE UPON ORDERING,   TIMED       Comments: Specimen A: Pre-op diagnosis: fournier gangrene    02/24/21 0909   02/24/21 0909  Acid Fast Culture with reflexed  sensitivities  RELEASE UPON ORDERING,   TIMED       Comments: Specimen A: Pre-op diagnosis: fournier gangrene    02/24/21 0909   02/24/21 0909  Acid Fast Smear (AFB)  RELEASE UPON ORDERING,   TIMED       Comments: Specimen A: Pre-op diagnosis: fournier gangrene    02/24/21 0909            Medications reviewed:  Scheduled Meds:  Chlorhexidine Gluconate Cloth  6 each Topical Q0600   cyanocobalamin  1,000 mcg Intramuscular Daily   insulin aspart  0-15 Units Subcutaneous Q4H   insulin detemir  12 Units Subcutaneous BID   mouth rinse  15 mL Mouth Rinse BID   nicotine  21 mg Transdermal Daily   pantoprazole  (PROTONIX) IV  40 mg Intravenous Q24H   Continuous Infusions:  clindamycin (CLEOCIN) IV Stopped (02/26/21 0254)   piperacillin-tazobactam 3.375 g (02/26/21 0556)   promethazine (PHENERGAN) injection (IM or IVPB) Stopped (02/25/21 0444)   vancomycin Stopped (02/25/21 2248)    Consultants:see note  Procedures:see note  Antimicrobials: Anti-infectives (From admission, onward)    Start     Dose/Rate Route Frequency Ordered Stop   02/25/21 2200  vancomycin (VANCOREADY) IVPB 750 mg/150 mL        750 mg 150 mL/hr over 60 Minutes Intravenous Every 12 hours 02/25/21 1349     02/24/21 1000  vancomycin (VANCOREADY) IVPB 1000 mg/200 mL  Status:  Discontinued        1,000 mg 200 mL/hr over 60 Minutes Intravenous Every 12 hours 02/23/21 2215 02/25/21 1349   02/23/21 2215  clindamycin (CLEOCIN) IVPB 600 mg        600 mg 100 mL/hr over 30 Minutes Intravenous Every 8 hours 02/23/21 2122     02/23/21 2200  vancomycin (VANCOREADY) IVPB 2000 mg/400 mL        2,000 mg 200 mL/hr over 120 Minutes Intravenous  Once 02/23/21 2133 02/23/21 2353   02/23/21 2200  piperacillin-tazobactam (ZOSYN) IVPB 3.375 g        3.375 g 12.5 mL/hr over 4 Hours Intravenous Every 8 hours 02/23/21 2133        Culture/Microbiology    Component Value Date/Time   SDES  02/24/2021 2706    ABSCESS PERINEUM Performed at [facility], [address], [place], [place] [address]    SPECREQUEST  02/24/2021 8315    NONE Performed at [facility], [address], [place], [place] [address]    CULT  02/24/2021 [phone]    CULTURE REINCUBATED FOR BETTER GROWTH Performed at [facility], [address] [place], [place] [address]    REPTSTATUS PENDING 02/24/2021 9485    Other culture-see note  Objective: Vitals: Today's Vitals   02/26/21 0500 02/26/21 0600 02/26/21 0700 02/26/21 0800  BP:  (!) 157/71    Pulse: 96 94 89   Resp: 17 14 17    Temp:    99 F (37.2 C)  TempSrc:     Oral  SpO2: 97% 98% 93%   Weight:      Height:      PainSc: 6        Intake/Output Summary (Last 24 hours) at 02/26/2021 0847 Last data filed at 02/26/2021 02/28/2021 Gross per 24 hour  Intake 1127.26 ml  Output 1470 ml  Net -342.74 ml   Filed Weights   02/22/21 1509 02/23/21 0400 02/24/21 0751  Weight: 101.1 kg 101.7 kg 101.7 kg   Weight  change:   Intake/Output from previous day: 07/13 0701 - 07/14 0700 In: 1127.3 [I.V.:401.5; IV Piggyback:725.8] Out: 1470 [Urine:1450; Blood:20] Intake/Output this shift: No intake/output data recorded. Filed Weights   02/22/21 1509 02/23/21 0400 02/24/21 0751  Weight: 101.1 kg 101.7 kg 101.7 kg    Examination: General exam: AAOx3,older than stated age, weak appearing. HEENT:Oral mucosa moist, Ear/Nose WNL grossly, dentition normal. Respiratory system: bilaterally clear breath sound, no use of accessory muscle Cardiovascular system: S1 & S2 +, No JVD,. Gastrointestinal system: Abdomen soft, surgical site clean dry intact with dressing no swelling, left-sided colostomy in place and empty.foley+ Nervous System:Alert, awake, moving extremities and grossly nonfocal Extremities: no edema, distal peripheral pulses palpable.  Skin: No rashes,no icterus. MSK: Normal muscle bulk,tone, power   Data Reviewed: I have personally reviewed following labs and imaging studies CBC: Recent Labs  Lab 02/22/21 1118 02/22/21 1155 02/23/21 0956 02/24/21 0636 02/25/21 0300 02/26/21 0249  WBC 13.2*  --  12.6* 12.2* 10.5 10.8*  NEUTROABS 11.4*  --   --  10.0* 8.6*  --   HGB 13.2 13.3 10.9* 11.8* 10.8* 10.1*  HCT 40.5 39.0 33.3* 37.7* 33.6* 31.3*  MCV 89.0  --  89.3 92.6 91.1 91.8  PLT 286  --  266 256 222 252   Basic Metabolic Panel: Recent Labs  Lab 02/23/21 0742 02/23/21 1419 02/24/21 0259 02/24/21 0636 02/25/21 0300 02/26/21 0249  NA 146* 145  --  144 140 139  K 5.0 3.6  --  3.5 3.3* 3.4*  CL 116* 110  --  109 107 107  CO2 20* 25  --  22 23 21*   GLUCOSE 165* 177*  --  197* 190* 184*  BUN 24* 20  --  18 24* 25*  CREATININE 1.34* 1.08 1.09 1.10 1.26* 1.24  CALCIUM 8.6* 8.6*  --  8.1* 7.2* 7.1*   GFR: Estimated Creatinine Clearance: 81.8 mL/min (by C-G formula based on SCr of 1.24 mg/dL). Liver Function Tests: Recent Labs  Lab 02/22/21 1118  AST 10*  ALT 10  ALKPHOS 80  BILITOT 1.4*  PROT 7.7  ALBUMIN 3.3*   No results for input(s): LIPASE, AMYLASE in the last 168 hours. No results for input(s): AMMONIA in the last 168 hours. Coagulation Profile: No results for input(s): INR, PROTIME in the last 168 hours. Cardiac Enzymes: No results for input(s): CKTOTAL, CKMB, CKMBINDEX, TROPONINI in the last 168 hours. BNP (last 3 results) No results for input(s): PROBNP in the last 8760 hours. HbA1C: No results for input(s): HGBA1C in the last 72 hours.  CBG: Recent Labs  Lab 02/25/21 1532 02/25/21 1947 02/25/21 2317 02/26/21 0327 02/26/21 0739  GLUCAP 280* 304* 272* 151* 130*   Lipid Profile: No results for input(s): CHOL, HDL, LDLCALC, TRIG, CHOLHDL, LDLDIRECT in the last 72 hours. Thyroid Function Tests: No results for input(s): TSH, T4TOTAL, FREET4, T3FREE, THYROIDAB in the last 72 hours. Anemia Panel: Recent Labs    02/23/21 0956  VITAMINB12 158*  FOLATE 7.8   Sepsis Labs: No results for input(s): PROCALCITON, LATICACIDVEN in the last 168 hours.  Recent Results (from the past 240 hour(s))  Resp Panel by RT-PCR (Flu A&B, Covid) Nasopharyngeal Swab     Status: None   Collection Time: 02/22/21  1:25 PM   Specimen: Nasopharyngeal Swab; Nasopharyngeal(NP) swabs in vial transport medium  Result Value Ref Range Status   SARS Coronavirus 2 by RT PCR NEGATIVE NEGATIVE Final    Comment: (NOTE) SARS-CoV-2 target nucleic acids are NOT DETECTED.  The SARS-CoV-2 RNA is generally detectable in upper respiratory specimens during the acute phase of infection. The lowest concentration of SARS-CoV-2 viral copies this  assay can detect is 138 copies/mL. A negative result does not preclude SARS-Cov-2 infection and should not be used as the sole basis for treatment or other patient management decisions. A negative result may occur with  improper specimen collection/handling, submission of specimen other than nasopharyngeal swab, presence of viral mutation(s) within the areas targeted by this assay, and inadequate number of viral copies(<138 copies/mL). A negative result must be combined with clinical observations, patient history, and epidemiological information. The expected result is Negative.  Fact Sheet for Patients:  BloggerCourse.comhttps://www.fda.gov/media/152166/download  Fact Sheet for Healthcare Providers:  SeriousBroker.ithttps://www.fda.gov/media/152162/download  This test is no t yet approved or cleared by the [place] FDA and  has been authorized for detection and/or diagnosis of SARS-CoV-2 by FDA under an Emergency Use Authorization (EUA). This EUA will remain  in effect (meaning this test can be used) for the duration of the COVID-19 declaration under Section 564(b)(1) of the Act, 21 U.S.C.section 360bbb-3(b)(1), unless the authorization is terminated  or revoked sooner.       Influenza A by PCR NEGATIVE NEGATIVE Final   Influenza B by PCR NEGATIVE NEGATIVE Final    Comment: (NOTE) The Xpert Xpress SARS-CoV-2/FLU/RSV plus assay is intended as an aid in the diagnosis of influenza from Nasopharyngeal swab specimens and should not be used as a sole basis for treatment. Nasal washings and aspirates are unacceptable for Xpert Xpress SARS-CoV-2/FLU/RSV testing.  Fact Sheet for Patients: BloggerCourse.comhttps://www.fda.gov/media/152166/download  Fact Sheet for Healthcare Providers: SeriousBroker.ithttps://www.fda.gov/media/152162/download  This test is not yet approved or cleared by the [place] FDA and has been authorized for detection and/or diagnosis of SARS-CoV-2 by FDA under an Emergency Use Authorization (EUA). This EUA will  remain in effect (meaning this test can be used) for the duration of the COVID-19 declaration under Section 564(b)(1) of the Act, 21 U.S.C. section 360bbb-3(b)(1), unless the authorization is terminated or revoked.  Performed at [facility], [address] [place], [place] [address]   MRSA Next Gen by PCR, Nasal     Status: None   Collection Time: 02/22/21  3:10 PM   Specimen: Nasal Mucosa; Nasal Swab  Result Value Ref Range Status   MRSA by PCR Next Gen NOT DETECTED NOT DETECTED Final    Comment: (NOTE) The GeneXpert MRSA Assay (FDA approved for NASAL specimens only), is one component of a comprehensive MRSA colonization surveillance program. It is not intended to diagnose MRSA infection nor to guide or monitor treatment for MRSA infections. Test performance is not FDA approved in patients less than 55 years old. Performed at [facility], [address] [place], [place] [address]   Culture, blood (routine x 2)     Status: None (Preliminary result)   Collection Time: 02/23/21 10:09 PM   Specimen: BLOOD  Result Value Ref Range Status   Specimen Description   Final    BLOOD BLOOD LEFT HAND Performed at [facility], [address] [place], [place] [address]    Special Requests   Final    BOTTLES DRAWN AEROBIC ONLY Blood Culture adequate volume Performed at [facility], [address] [place], [place] [address]    Culture   Final    NO GROWTH 2 DAYS Performed at [facility], [address] [place], [place] [address]    Report Status PENDING  Incomplete  Culture, blood (routine x 2)     Status: None (Preliminary result)   Collection Time: 02/24/21  2:59 AM   Specimen: BLOOD  Result Value Ref Range Status   Specimen Description   Final    BLOOD BLOOD RIGHT HAND Performed at [facility], [address] [place], [place] [address]    Special Requests   Final     BOTTLES DRAWN AEROBIC ONLY Blood Culture adequate volume Performed at [facility], [address] [place] [address], [place], [place] [address]    Culture   Final    NO GROWTH 2 DAYS Performed at [facility], [address], [place], [place] [address]    Report Status PENDING  Incomplete  Aerobic/Anaerobic Culture w Gram Stain (surgical/deep wound)     Status: None (Preliminary result)   Collection Time: 02/24/21  9:03 AM   Specimen: PATH Cytology Peritoneal fluid; Body Fluid  Result Value Ref Range Status   Specimen Description   Final    ABSCESS PERINEUM Performed at [facility], [address], [place], [place] [address]    Special Requests   Final    NONE Performed at [facility], [address], [place], [place] [address]    Gram Stain   Final    RARE WBC PRESENT,BOTH PMN AND MONONUCLEAR ABUNDANT GRAM POSITIVE RODS FEW GRAM POSITIVE COCCI IN PAIRS MODERATE GRAM NEGATIVE COCCOBACILLI    Culture   Final    CULTURE REINCUBATED FOR BETTER GROWTH Performed at [facility], [address], [place], [place] [address]    Report Status PENDING  Incomplete     Radiology Studies: No results found.   LOS: 4 days   Lanae Boast, MD [facility]  02/26/2021, 8:47 AM

## 2021-02-26 NOTE — Progress Notes (Signed)
Inpatient Diabetes Program Recommendations  AACE/ADA: New Consensus Statement on Inpatient Glycemic Control (2015)  Target Ranges:  Prepandial:   less than 140 mg/dL      Peak postprandial:   less than 180 mg/dL (1-2 hours)      Critically ill patients:  140 - 180 mg/dL   Lab Results  Component Value Date   GLUCAP 130 (H) 02/26/2021   HGBA1C 9.5 (H) 02/22/2021    Review of Glycemic Control  Post-prandials elevated.  Current orders for Inpatient glycemic control: Levemir 12 units BID, Novolog 0-15 units Q4H  Still on CL diet  Inpatient Diabetes Program Recommendations:    When diet is advanced, add Novolog 3-4 units TID for meal coverage insulin if eating > 50%  Continue to follow glucose trends.   Thank you. Ailene Ards, RD, LDN, CDE Inpatient Diabetes Coordinator [phone]

## 2021-02-26 NOTE — Progress Notes (Signed)
Progress Note  1 Day Post-Op  Subjective: Patient reports gas pains in abdomen and pain in perineum. Has not really used any of the PO pain meds. Has been tolerating CLD overnight and denies nausea. Has not been out of bed yet. He reports he has a friend who is an Charity fundraiser who may be able to help with dressing changes after he is discharged.   Objective: Vital signs in last 24 hours: Temp:  [98 F (36.7 C)-99 F (37.2 C)] 99 F (37.2 C) (07/14 0800) Pulse Rate:  [85-96] 89 (07/14 0700) Resp:  [10-20] 17 (07/14 0700) BP: (95-157)/(49-78) 157/71 (07/14 0600) SpO2:  [90 %-98 %] 93 % (07/14 0700) Last BM Date: 02/20/21  Intake/Output from previous day: 07/13 0701 - 07/14 0700 In: 1127.3 [I.V.:401.5; IV Piggyback:725.8] Out: 1470 [Urine:1450; Blood:20] Intake/Output this shift: No intake/output data recorded.  PE: General: pleasant, WD, obese male who is laying in bed in NAD Heart: regular, rate, and rhythm.   Lungs:  Respiratory effort nonlabored Abd: soft, appropriately ttp, mild-mod distention, BS hypoactive, stoma in LLQ without much in bag GU: foley present, perineal wound with small amount purulent drainage on packing, cellulitis improving     Lab Results:  Recent Labs    02/25/21 0300 02/26/21 0249  WBC 10.5 10.8*  HGB 10.8* 10.1*  HCT 33.6* 31.3*  PLT 222 252   BMET Recent Labs    02/25/21 0300 02/26/21 0249  NA 140 139  K 3.3* 3.4*  CL 107 107  CO2 23 21*  GLUCOSE 190* 184*  BUN 24* 25*  CREATININE 1.26* 1.24  CALCIUM 7.2* 7.1*   PT/INR No results for input(s): LABPROT, INR in the last 72 hours. CMP     Component Value Date/Time   NA 139 02/26/2021 0249   K 3.4 (L) 02/26/2021 0249   CL 107 02/26/2021 0249   CO2 21 (L) 02/26/2021 0249   GLUCOSE 184 (H) 02/26/2021 0249   BUN 25 (H) 02/26/2021 0249   CREATININE 1.24 02/26/2021 0249   CALCIUM 7.1 (L) 02/26/2021 0249   PROT 7.7 02/22/2021 1118   ALBUMIN 3.3 (L) 02/22/2021 1118   AST 10 (L)  02/22/2021 1118   ALT 10 02/22/2021 1118   ALKPHOS 80 02/22/2021 1118   BILITOT 1.4 (H) 02/22/2021 1118   GFRNONAA >60 02/26/2021 0249   GFRAA >60 05/14/2019 0317   Lipase     Component Value Date/Time   LIPASE 193 (H) 05/10/2019 1946       Studies/Results: No results found.  Anti-infectives: Anti-infectives (From admission, onward)    Start     Dose/Rate Route Frequency Ordered Stop   02/25/21 2200  vancomycin (VANCOREADY) IVPB 750 mg/150 mL        750 mg 150 mL/hr over 60 Minutes Intravenous Every 12 hours 02/25/21 1349     02/24/21 1000  vancomycin (VANCOREADY) IVPB 1000 mg/200 mL  Status:  Discontinued        1,000 mg 200 mL/hr over 60 Minutes Intravenous Every 12 hours 02/23/21 2215 02/25/21 1349   02/23/21 2215  clindamycin (CLEOCIN) IVPB 600 mg        600 mg 100 mL/hr over 30 Minutes Intravenous Every 8 hours 02/23/21 2122     02/23/21 2200  vancomycin (VANCOREADY) IVPB 2000 mg/400 mL        2,000 mg 200 mL/hr over 120 Minutes Intravenous  Once 02/23/21 2133 02/23/21 2353   02/23/21 2200  piperacillin-tazobactam (ZOSYN) IVPB 3.375 g  3.375 g 12.5 mL/hr over 4 Hours Intravenous Every 8 hours 02/23/21 2133          Assessment/Plan Fournier's gangrene  POD2 I&D perineum 15 x 10 x 8 cm, containing skin, subcutaneous tissue, fascia POD1 laparoscopic loop sigmoid colostomy with LOA  - no output from colostomy yet but tolerating CLD - keep on CLD until more bowel function  - WOC to see for new colostomy  - PT/OT, needs to start mobilizing  - ordered pressure redistribution mat for chair  - wound care: BID saline WTD dressing to perineal wounds, cover with ABD and brief - WBC 10.8, afeb, VSS; cx pending - continue abx   FEN: CLD, IVF per TRH VTE: SCDs, ok to start LMWH or SQH from a surgery standpoint  ID: [email or web address]/[email or web address]/[email or web address] 7/11>> Foley: keep today, possibly remove 7/15  - below per primary -  DKA/T2DM, poorly controlled HLD HTN PAD with  claudication  Tobacco abuse Obesity - BMI 31.27  LOS: 4 days    Juliet Rude, [facility] Surgery 02/26/2021, 8:55 AM Please see Amion for pager number during day hours 7:00am-4:30pm
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
## 2021-02-26 NOTE — Evaluation (Signed)
Occupational Therapy Evaluation Patient Details Name: Andres Richardson MRN: [id number] DOB: 12/30/1965 Today's Date: 02/26/2021    History of Present Illness patient admiited for Fournier's gangrene. 55 year old male with diabetes to with diabetes admitted with intractable nausea vomiting, DKA managed with insulin drip on 7/10. Patient is status post debridement by general surgery and urology 7/12- "laparoscopic creation of loop sigmoid colostomy, laparoscopic lysis of adhesions, perineal exam under anesthesia"   Clinical Impression   [name] is a 55 year old man s/p above procedures who presents with pain and decreased activity tolerance. Patient min assist for bed transfers and min guard with RW to ambulate short distance. Patient needing max assist for LB ADLs and is currently total assist for toileting due to catheter and colostomy. Patient will benefit from skilled OT services while in hospital to improve deficits and learn compensatory strategies as needed in order to return PLOF.       Follow Up Recommendations  Home health OT    Equipment Recommendations  3 in 1 bedside commode    Recommendations for Other Services       Precautions / Restrictions Precautions Precautions: Other (comment) Precaution Comments: L side colostomy, perineal wounds Restrictions Weight Bearing Restrictions: No      Mobility Bed Mobility Overal bed mobility: Needs Assistance Bed Mobility: Supine to Sit;Sit to Supine     Supine to sit: Min assist Sit to supine: Min assist        Transfers Overall transfer level: Needs assistance Equipment used: Rolling walker (2 wheeled) Transfers: Sit to/from [employer] Sit to Stand: Min guard Stand pivot transfers: Min guard       General transfer comment: Min guard for standing and ambulating towards window and back to bed.    Balance Overall balance assessment: Mild deficits observed, not formally tested                                          ADL either performed or assessed with clinical judgement   ADL Overall ADL's : Needs assistance/impaired Eating/Feeding: Independent   Grooming: Set up;Sitting;Bed level   Upper Body Bathing: Set up;Sitting   Lower Body Bathing: Maximal assistance;Sitting/lateral leans   Upper Body Dressing : Set up;Sitting   Lower Body Dressing: Maximal assistance;Sit to/from stand   Toilet Transfer: Min guard;BSC;Comfort height toilet;RW   Toileting- Architect and Hygiene: Total assistance Toileting - Clothing Manipulation Details (indicate cue type and reason): colostomy and foley catheter     Functional mobility during ADLs: Min guard;Rolling walker       Vision Baseline Vision/History: Wears glasses       Perception     Praxis      Pertinent Vitals/Pain Pain Assessment: Faces Faces Pain Scale: Hurts little more Pain Location: abd/perineal Pain Descriptors / Indicators: Grimacing;Guarding Pain Intervention(s): Repositioned;Premedicated before session     Hand Dominance Right   Extremity/Trunk Assessment Upper Extremity Assessment Upper Extremity Assessment: Overall WFL for tasks assessed   Lower Extremity Assessment Lower Extremity Assessment: Overall WFL for tasks assessed   Cervical / Trunk Assessment Cervical / Trunk Assessment: Kyphotic   Communication Communication Communication: No difficulties   Cognition Arousal/Alertness: Awake/alert Behavior During Therapy: WFL for tasks assessed/performed Overall Cognitive Status: Within Functional Limits for tasks assessed  General Comments       Exercises     Shoulder Instructions      Home Living Family/patient expects to be discharged to:: Private residence Living Arrangements: Non-relatives/Friends Available Help at Discharge: Available PRN/intermittently Type of Home: House Home Access: Stairs to  enter Entrance Stairs-Number of Steps: 3   Home Layout: Two level;Able to live on main level with bedroom/bathroom     Bathroom Shower/Tub: Producer, television/film/video: Handicapped height     Home Equipment: Cane - single point          Prior Functioning/Environment Level of Independence: Independent                 OT Problem List: Decreased activity tolerance;Decreased knowledge of use of DME or AE;Obesity;Pain      OT Treatment/Interventions: Self-care/ADL training;DME and/or AE instruction;Therapeutic activities;Patient/family education    OT Goals(Current goals can be found in the care plan section) Acute Rehab OT Goals Patient Stated Goal: improve independence to go home OT Goal Formulation: With patient Time For Goal Achievement: 03/12/21 Potential to Achieve Goals: Good  OT Frequency: Min 2X/week   Barriers to D/C:            Co-evaluation              AM-PAC OT "6 Clicks" Daily Activity     Outcome Measure Help from another person eating meals?: None Help from another person taking care of personal grooming?: A Little Help from another person toileting, which includes using toliet, bedpan, or urinal?: Total (foley, colostomy) Help from another person bathing (including washing, rinsing, drying)?: A Lot Help from another person to put on and taking off regular upper body clothing?: A Little Help from another person to put on and taking off regular lower body clothing?: A Lot 6 Click Score: 15   End of Session Equipment Utilized During Treatment: Rolling walker Nurse Communication: Mobility status  Activity Tolerance: Patient tolerated treatment well Patient left: in bed;with call bell/phone within reach;with family/visitor present  OT Visit Diagnosis: Pain                Time: 3383-2919 OT Time Calculation (min): 18 min Charges:  OT General Charges $OT Visit: 1 Visit OT Evaluation $OT Eval Low Complexity: 1 Low  Andres Richardson,  OTR/L Acute Care Rehab Services  Office [phone] Pager: [phone]   Andres Richardson 02/26/2021, 4:37 PM

## 2021-02-27 LAB — CBC
HCT: 30.8 % — ABNORMAL LOW (ref 39.0–52.0)
Hemoglobin: 9.6 g/dL — ABNORMAL LOW (ref 13.0–17.0)
MCH: 28.8 pg (ref 26.0–34.0)
MCHC: 31.2 g/dL (ref 30.0–36.0)
MCV: 92.5 fL (ref 80.0–100.0)
Platelets: 254 10*3/uL (ref 150–400)
RBC: 3.33 MIL/uL — ABNORMAL LOW (ref 4.22–5.81)
RDW: 15.3 % (ref 11.5–15.5)
WBC: 10.2 10*3/uL (ref 4.0–10.5)
nRBC: 0 % (ref 0.0–0.2)

## 2021-02-27 LAB — GLUCOSE, CAPILLARY
Glucose-Capillary: 120 mg/dL — ABNORMAL HIGH (ref 70–99)
Glucose-Capillary: 86 mg/dL (ref 70–99)
Glucose-Capillary: 76 mg/dL (ref 70–99)
Glucose-Capillary: 138 mg/dL — ABNORMAL HIGH (ref 70–99)
Glucose-Capillary: 155 mg/dL — ABNORMAL HIGH (ref 70–99)
Glucose-Capillary: 107 mg/dL — ABNORMAL HIGH (ref 70–99)

## 2021-02-27 LAB — BASIC METABOLIC PANEL
Anion gap: 9 (ref 5–15)
BUN: 22 mg/dL — ABNORMAL HIGH (ref 6–20)
CO2: 23 mmol/L (ref 22–32)
Calcium: 6.8 mg/dL — ABNORMAL LOW (ref 8.9–10.3)
Chloride: 104 mmol/L (ref 98–111)
Creatinine, Ser: 1.22 mg/dL (ref 0.61–1.24)
GFR, Estimated: >60 mL/min (ref >60)
Glucose, Bld: 127 mg/dL — ABNORMAL HIGH (ref 70–99)
Potassium: 2.9 mmol/L — ABNORMAL LOW (ref 3.5–5.1)
Sodium: 136 mmol/L (ref 135–145)

## 2021-02-27 MED ORDER — POTASSIUM CHLORIDE 10 MEQ/100ML IV SOLN
10.0000 meq | INTRAVENOUS | Status: AC
  Administered 2021-02-27 (×4): 10 meq via INTRAVENOUS
  Filled 2021-02-27 (×4): qty 100

## 2021-02-27 MED ORDER — SODIUM CHLORIDE 0.9 % IV SOLN
3.0000 g | Freq: Four times a day (QID) | INTRAVENOUS | Status: DC
  Administered 2021-02-27 – 2021-03-06 (×27): 3 g via INTRAVENOUS
  Filled 2021-02-27 (×3): qty 8
  Filled 2021-02-27 (×3): qty 3
  Filled 2021-02-27 (×2): qty 8
  Filled 2021-02-27: qty 3
  Filled 2021-02-27: qty 8
  Filled 2021-02-27 (×3): qty 3
  Filled 2021-02-27 (×2): qty 8
  Filled 2021-02-27: qty 3
  Filled 2021-02-27 (×3): qty 8
  Filled 2021-02-27 (×2): qty 3
  Filled 2021-02-27 (×4): qty 8
  Filled 2021-02-27 (×3): qty 3
  Filled 2021-02-27: qty 8
  Filled 2021-02-27: qty 3

## 2021-02-27 MED ORDER — POTASSIUM CHLORIDE CRYS ER 20 MEQ PO TBCR
40.0000 meq | EXTENDED_RELEASE_TABLET | Freq: Once | ORAL | Status: AC
  Administered 2021-02-27: 40 meq via ORAL
  Filled 2021-02-27: qty 2

## 2021-02-27 NOTE — Progress Notes (Signed)
PROGRESS NOTE    Andres Richardson  AVW:[id number] DOB: [birth year] DOA: 02/22/2021 PCP: [name], MD   No chief complaint on file.  Brief Narrative: 55 year old male with diabetes to with diabetes admitted with intractable nausea vomiting, DKA managed with insulin drip on 7/10.  Patient however demonstrated ongoing fever and vomiting CT abdomen showed perirectal abscess for fournier gangrene surgery, urology was consulted. Patient is status post debridement by general surgery and urology 7/12- "laparoscopic creation of loop sigmoid colostomy, laparoscopic lysis of adhesions, perineal exam under anesthesia"  Subjective: Seen this morning.  Patient is wondering about advancing diet. Passing gas in the colostomy with some stool. K low, no fever.   Assessment & Plan:  Fournier's gangrene: CCS and urology following- s/p debridement 7/12 and loop sigmoid colostomy, LOS by gen surgery 7/13. leukocytosis resolved.  OR culture 7/12- stain w/ abundant GPR, few GPC GNCB- culture strep anginosis, no anerobes.Cont on vancomycin, Zosyn. S/p Clindamycinx 72 hr. Await CCS inputs.  Recent Labs  Lab 02/23/21 0956 02/24/21 0636 02/25/21 0300 02/26/21 0249 02/27/21 0245  WBC 12.6* 12.2* 10.5 10.8* 10.2   Hyponatremia: we will replete aggressively  DKA-resolved Type 2 diabetes mellitus-poorly controlled Diabetes poorly controlled, a1c 9.5 Continue Levemir 12 u bid,SSI diabetic diet, DM coordinator following. Monitor cbg. Recent Labs  Lab 02/26/21 1549 02/26/21 2003 02/26/21 2326 02/27/21 0338 02/27/21 0738  GLUCAP 150* 110* 123* 120* 86   Nausea- cont prn zofran, PPI.  AKI -resolved  Recent Labs  Lab 02/23/21 1419 02/24/21 0259 02/24/21 0636 02/25/21 0300 02/26/21 0249 02/27/21 0245  BUN 20  --  18 24* 25* 22*  CREATININE 1.08 1.09 1.10 1.26* 1.24 1.22  Hypernatremia resolved Hypertension:BP stable Tobacco abuse continue nicotine patch  Morbid obesity BMI more than 30 will  benefit with weight loss healthy lifestyle.  Diet Order             Diet clear liquid Room service appropriate? Yes; Fluid consistency: Thin  Diet effective now                   Patient's Body mass index is 31.27 kg/m. DVT prophylaxis: enoxaparin (LOVENOX) injection 40 mg Start: 02/26/21 1000SCD. Lovenox once okay w Surgery Code Status:   Code Status: DNR  Family Communication: plan of care discussed with patient at bedside.  Status is: Inpatient Remains inpatient appropriate because:IV treatments appropriate due to intensity of illness or inability to take PO and Inpatient level of care appropriate due to severity of illness Dispo: The patient is from: Home              Anticipated d/c is to: HHPT.              Patient currently is not medically stable to d/c.   Difficult to place patient No Unresulted Labs (From admission, onward)     Start     Ordered   02/26/21 0500  Basic metabolic panel  Daily,   R     Question:  Specimen collection method  Answer:  Lab=Lab collect   02/25/21 0827   02/26/21 0500  CBC  Daily,   R     Question:  Specimen collection method  Answer:  Lab=Lab collect   02/25/21 0827   02/24/21 0909  Acid Fast Culture with reflexed sensitivities  RELEASE UPON ORDERING,   TIMED       Comments: Specimen A: Pre-op diagnosis: fournier gangrene    02/24/21 0909   02/24/21 0909  Acid Fast  Smear (AFB)  RELEASE UPON ORDERING,   TIMED       Comments: Specimen A: Pre-op diagnosis: fournier gangrene    02/24/21 0909            Medications reviewed:  Scheduled Meds:  (feeding supplement) PROSource Plus  30 mL Oral BID BM   acetaminophen  1,000 mg Oral Q6H   vitamin C  500 mg Oral BID   Chlorhexidine Gluconate Cloth  6 each Topical Q0600   cyanocobalamin  1,000 mcg Intramuscular Daily   enoxaparin (LOVENOX) injection  40 mg Subcutaneous Q24H   feeding supplement  1 Container Oral BID BM   insulin aspart  0-15 Units Subcutaneous Q4H   insulin detemir   12 Units Subcutaneous BID   mouth rinse  15 mL Mouth Rinse BID   methocarbamol  500 mg Oral TID   multivitamin with minerals  1 tablet Oral Daily   nicotine  21 mg Transdermal Daily   nutrition supplement (JUVEN)  1 packet Oral BID BM   pantoprazole (PROTONIX) IV  40 mg Intravenous Q24H   zinc sulfate  220 mg Oral Daily   Continuous Infusions:  piperacillin-tazobactam 3.375 g (02/27/21 0606)   potassium chloride 10 mEq (02/27/21 0943)   promethazine (PHENERGAN) injection (IM or IVPB) Stopped (02/25/21 0444)   vancomycin Stopped (02/26/21 2256)    Consultants:see note  Procedures:see note  Antimicrobials: Anti-infectives (From admission, onward)    Start     Dose/Rate Route Frequency Ordered Stop   02/25/21 2200  vancomycin (VANCOREADY) IVPB 750 mg/150 mL        750 mg 150 mL/hr over 60 Minutes Intravenous Every 12 hours 02/25/21 1349     02/24/21 1000  vancomycin (VANCOREADY) IVPB 1000 mg/200 mL  Status:  Discontinued        1,000 mg 200 mL/hr over 60 Minutes Intravenous Every 12 hours 02/23/21 2215 02/25/21 1349   02/23/21 2215  clindamycin (CLEOCIN) IVPB 600 mg        600 mg 100 mL/hr over 30 Minutes Intravenous Every 8 hours 02/23/21 2122 02/26/21 2209   02/23/21 2200  vancomycin (VANCOREADY) IVPB 2000 mg/400 mL        2,000 mg 200 mL/hr over 120 Minutes Intravenous  Once 02/23/21 2133 02/23/21 2353   02/23/21 2200  piperacillin-tazobactam (ZOSYN) IVPB 3.375 g        3.375 g 12.5 mL/hr over 4 Hours Intravenous Every 8 hours 02/23/21 2133        Culture/Microbiology    Component Value Date/Time   SDES  02/24/2021 8657    ABSCESS PERINEUM Performed at [facility], [address], [place], [place] [address]    SPECREQUEST  02/24/2021 2952    NONE Performed at [facility], [address] [place], [place] [address]    CULT  02/24/2021 [phone]    FEW STREPTOCOCCUS ANGINOSIS SUSCEPTIBILITIES TO FOLLOW NO ANAEROBES ISOLATED;  CULTURE IN PROGRESS FOR 5 DAYS    REPTSTATUS PENDING 02/24/2021 0272    Other culture-see note  Objective: Vitals: Today's Vitals   02/27/21 0400 02/27/21 0500 02/27/21 0600 02/27/21 0800  BP: (!) 149/61  (!) 159/71   Pulse: 88 84 86   Resp: 19 13 15    Temp:    98.9 F (37.2 C)  TempSrc:    Oral  SpO2: 94% 92% 91%   Weight:      Height:      PainSc: 4        Intake/Output  Summary (Last 24 hours) at 02/27/2021 1031 Last data filed at 02/27/2021 0606 Gross per 24 hour  Intake 1015.4 ml  Output 1550 ml  Net -534.6 ml   Filed Weights   02/22/21 1509 02/23/21 0400 02/24/21 0751  Weight: 101.1 kg 101.7 kg 101.7 kg   Weight change:   Intake/Output from previous day: 07/14 0701 - 07/15 0700 In: 1015.4 [P.O.:225; IV Piggyback:790.4] Out: 1550 [Urine:1550] Intake/Output this shift: No intake/output data recorded. Filed Weights   02/22/21 1509 02/23/21 0400 02/24/21 0751  Weight: 101.1 kg 101.7 kg 101.7 kg    Examination: General exam: AAOx3, pleasant, on RA HEENT:Oral mucosa moist, Ear/Nose WNL grossly, dentition normal. Respiratory system: bilaterally clear breath sounds, no use of accessory muscle Cardiovascular system: S1 & S2 +, No JVD,. Gastrointestinal system: Abdomen soft, left colostomy site with small amount of stool, NT,ND, BS+ Nervous System:Alert, awake, moving extremities and grossly nonfocal Extremities: no edema, distal peripheral pulses palpable.  Skin: No rashes,no icterus. MSK: Normal muscle bulk,tone, power   Data Reviewed: I have personally reviewed following labs and imaging studies CBC: Recent Labs  Lab 02/22/21 1118 02/22/21 1155 02/23/21 0956 02/24/21 0636 02/25/21 0300 02/26/21 0249 02/27/21 0245  WBC 13.2*  --  12.6* 12.2* 10.5 10.8* 10.2  NEUTROABS 11.4*  --   --  10.0* 8.6*  --   --   HGB 13.2   < > 10.9* 11.8* 10.8* 10.1* 9.6*  HCT 40.5   < > 33.3* 37.7* 33.6* 31.3* 30.8*  MCV 89.0  --  89.3 92.6 91.1 91.8 92.5  PLT 286  --  266  256 222 252 254   < > = values in this interval not displayed.   Basic Metabolic Panel: Recent Labs  Lab 02/23/21 1419 02/24/21 0259 02/24/21 0636 02/25/21 0300 02/26/21 0249 02/27/21 0245  NA 145  --  144 140 139 136  K 3.6  --  3.5 3.3* 3.4* 2.9*  CL 110  --  109 107 107 104  CO2 25  --  22 23 21* 23  GLUCOSE 177*  --  197* 190* 184* 127*  BUN 20  --  18 24* 25* 22*  CREATININE 1.08 1.09 1.10 1.26* 1.24 1.22  CALCIUM 8.6*  --  8.1* 7.2* 7.1* 6.8*   GFR: Estimated Creatinine Clearance: 83.1 mL/min (by C-G formula based on SCr of 1.22 mg/dL). Liver Function Tests: Recent Labs  Lab 02/22/21 1118  AST 10*  ALT 10  ALKPHOS 80  BILITOT 1.4*  PROT 7.7  ALBUMIN 3.3*   No results for input(s): LIPASE, AMYLASE in the last 168 hours. No results for input(s): AMMONIA in the last 168 hours. Coagulation Profile: No results for input(s): INR, PROTIME in the last 168 hours. Cardiac Enzymes: No results for input(s): CKTOTAL, CKMB, CKMBINDEX, TROPONINI in the last 168 hours. BNP (last 3 results) No results for input(s): PROBNP in the last 8760 hours. HbA1C: No results for input(s): HGBA1C in the last 72 hours.  CBG: Recent Labs  Lab 02/26/21 1549 02/26/21 2003 02/26/21 2326 02/27/21 0338 02/27/21 0738  GLUCAP 150* 110* 123* 120* 86   Lipid Profile: No results for input(s): CHOL, HDL, LDLCALC, TRIG, CHOLHDL, LDLDIRECT in the last 72 hours. Thyroid Function Tests: No results for input(s): TSH, T4TOTAL, FREET4, T3FREE, THYROIDAB in the last 72 hours. Anemia Panel: No results for input(s): VITAMINB12, FOLATE, FERRITIN, TIBC, IRON, RETICCTPCT in the last 72 hours.  Sepsis Labs: No results for input(s): PROCALCITON, LATICACIDVEN in the last 168 hours.  Recent Results (from the past 240 hour(s))  Resp Panel by RT-PCR (Flu A&B, Covid) Nasopharyngeal Swab     Status: None   Collection Time: 02/22/21  1:25 PM   Specimen: Nasopharyngeal Swab; Nasopharyngeal(NP) swabs in vial  transport medium  Result Value Ref Range Status   SARS Coronavirus 2 by RT PCR NEGATIVE NEGATIVE Final    Comment: (NOTE) SARS-CoV-2 target nucleic acids are NOT DETECTED.  The SARS-CoV-2 RNA is generally detectable in upper respiratory specimens during the acute phase of infection. The lowest concentration of SARS-CoV-2 viral copies this assay can detect is 138 copies/mL. A negative result does not preclude SARS-Cov-2 infection and should not be used as the sole basis for treatment or other patient management decisions. A negative result may occur with  improper specimen collection/handling, submission of specimen other than nasopharyngeal swab, presence of viral mutation(s) within the areas targeted by this assay, and inadequate number of viral copies(<138 copies/mL). A negative result must be combined with clinical observations, patient history, and epidemiological information. The expected result is Negative.  Fact Sheet for Patients:  BloggerCourse.com  Fact Sheet for Healthcare Providers:  SeriousBroker.it  This test is no t yet approved or cleared by the [place] FDA and  has been authorized for detection and/or diagnosis of SARS-CoV-2 by FDA under an Emergency Use Authorization (EUA). This EUA will remain  in effect (meaning this test can be used) for the duration of the COVID-19 declaration under Section 564(b)(1) of the Act, 21 U.S.C.section 360bbb-3(b)(1), unless the authorization is terminated  or revoked sooner.       Influenza A by PCR NEGATIVE NEGATIVE Final   Influenza B by PCR NEGATIVE NEGATIVE Final    Comment: (NOTE) The Xpert Xpress SARS-CoV-2/FLU/RSV plus assay is intended as an aid in the diagnosis of influenza from Nasopharyngeal swab specimens and should not be used as a sole basis for treatment. Nasal washings and aspirates are unacceptable for Xpert Xpress SARS-CoV-2/FLU/RSV testing.  Fact  Sheet for Patients: BloggerCourse.com  Fact Sheet for Healthcare Providers: SeriousBroker.it  This test is not yet approved or cleared by the [place] FDA and has been authorized for detection and/or diagnosis of SARS-CoV-2 by FDA under an Emergency Use Authorization (EUA). This EUA will remain in effect (meaning this test can be used) for the duration of the COVID-19 declaration under Section 564(b)(1) of the Act, 21 U.S.C. section 360bbb-3(b)(1), unless the authorization is terminated or revoked.  Performed at [facility], [address], [place], [place] [address]   MRSA Next Gen by PCR, Nasal     Status: None   Collection Time: 02/22/21  3:10 PM   Specimen: Nasal Mucosa; Nasal Swab  Result Value Ref Range Status   MRSA by PCR Next Gen NOT DETECTED NOT DETECTED Final    Comment: (NOTE) The GeneXpert MRSA Assay (FDA approved for NASAL specimens only), is one component of a comprehensive MRSA colonization surveillance program. It is not intended to diagnose MRSA infection nor to guide or monitor treatment for MRSA infections. Test performance is not FDA approved in patients less than 56 years old. Performed at [facility], [address] [place], [place] [address]   Culture, blood (routine x 2)     Status: None (Preliminary result)   Collection Time: 02/23/21 10:09 PM   Specimen: BLOOD  Result Value Ref Range Status   Specimen Description   Final    BLOOD BLOOD LEFT HAND Performed at [facility],  [address] [place], [place] [address]    Special Requests   Final    BOTTLES DRAWN AEROBIC ONLY Blood Culture adequate volume Performed at [facility], [address] [place], [place] [address]    Culture   Final    NO GROWTH 3 DAYS Performed at [facility], [address] [place], [place] [address]    Report Status PENDING   Incomplete  Culture, blood (routine x 2)     Status: None (Preliminary result)   Collection Time: 02/24/21  2:59 AM   Specimen: BLOOD  Result Value Ref Range Status   Specimen Description   Final    BLOOD BLOOD RIGHT HAND Performed at [facility], [address] [place] [place] [place], [place] [address]    Special Requests   Final    BOTTLES DRAWN AEROBIC ONLY Blood Culture adequate volume Performed at [facility], [address] [place], [place] [address]    Culture   Final    NO GROWTH 3 DAYS Performed at [facility], [address] [place], [place] [address]    Report Status PENDING  Incomplete  Fungus Culture With Stain     Status: None (Preliminary result)   Collection Time: 02/24/21  9:03 AM   Specimen: PATH Cytology Peritoneal fluid; Body Fluid  Result Value Ref Range Status   Fungus Stain Final report  Final    Comment: (NOTE) Performed At: [facility] [address] [place], [place] [id number] Jolene SchimkeNagendra Sanjai MD BM:[id number]    Fungus (Mycology) Culture PENDING  Incomplete   Fungal Source ABSCESS  Final    Comment: PERINEUM Performed at [facility], [address] [place], [place] [address]   Aerobic/Anaerobic Culture w Gram Stain (surgical/deep wound)     Status: None (Preliminary result)   Collection Time: 02/24/21  9:03 AM   Specimen: PATH Cytology Peritoneal fluid; Body Fluid  Result Value Ref Range Status   Specimen Description   Final    ABSCESS PERINEUM Performed at [facility], [address] [place], [place] [address]    Special Requests   Final    NONE Performed at [facility], [address] [place], [place] [address]    Gram Stain   Final    RARE WBC PRESENT,BOTH PMN AND MONONUCLEAR ABUNDANT GRAM POSITIVE RODS FEW GRAM POSITIVE COCCI IN PAIRS MODERATE GRAM NEGATIVE COCCOBACILLI Performed at [facility], [address]  [place], [place] [address]    Culture   Final    FEW STREPTOCOCCUS ANGINOSIS SUSCEPTIBILITIES TO FOLLOW NO ANAEROBES ISOLATED; CULTURE IN PROGRESS FOR 5 DAYS    Report Status PENDING  Incomplete  Fungus Culture Result     Status: None   Collection Time: 02/24/21  9:03 AM  Result Value Ref Range Status   Result 1 Comment  Final    Comment: (NOTE) KOH/Calcofluor preparation:  no fungus observed. Performed At: [facility] [address] [place], [place] [id number] Jolene SchimkeNagendra Sanjai MD JJ:[id number]      Radiology Studies: No results found.   LOS: 5 days   Lanae Boastamesh Aleila Syverson, MD [facility]  02/27/2021, 10:31 AM

## 2021-02-27 NOTE — Progress Notes (Signed)
Progress Note  2 Days Post-Op  Subjective: Patient tolerating diet. Pain in backside with shifting around.   Objective: Vital signs in last 24 hours: Temp:  [97.8 F (36.6 C)-99 F (37.2 C)] 98.9 F (37.2 C) (07/15 0800) Pulse Rate:  [84-96] 86 (07/15 0600) Resp:  [13-19] 15 (07/15 0600) BP: (91-159)/(17-71) 159/71 (07/15 0600) SpO2:  [91 %-96 %] 91 % (07/15 0600) Last BM Date: 02/20/21  Intake/Output from previous day: 07/14 0701 - 07/15 0700 In: 1015.4 [P.O.:225; IV Piggyback:790.4] Out: 1550 [Urine:1550] Intake/Output this shift: Total I/O In: 29.3 [IV Piggyback:29.3] Out: -   PE: General: pleasant, WD, obese male who is laying in bed in NAD Heart: regular, rate, and rhythm.   Lungs:  Respiratory effort nonlabored Abd: soft, appropriately ttp, mild-mod distention, BS hypoactive, stoma in LLQ without much in bag GU: foley present, perineal wound with some purulent drainage on packing, cellulitis improving       Lab Results:  Recent Labs    02/26/21 0249 02/27/21 0245  WBC 10.8* 10.2  HGB 10.1* 9.6*  HCT 31.3* 30.8*  PLT 252 254   BMET Recent Labs    02/26/21 0249 02/27/21 0245  NA 139 136  K 3.4* 2.9*  CL 107 104  CO2 21* 23  GLUCOSE 184* 127*  BUN 25* 22*  CREATININE 1.24 1.22  CALCIUM 7.1* 6.8*   PT/INR No results for input(s): LABPROT, INR in the last 72 hours. CMP     Component Value Date/Time   NA 136 02/27/2021 0245   K 2.9 (L) 02/27/2021 0245   CL 104 02/27/2021 0245   CO2 23 02/27/2021 0245   GLUCOSE 127 (H) 02/27/2021 0245   BUN 22 (H) 02/27/2021 0245   CREATININE 1.22 02/27/2021 0245   CALCIUM 6.8 (L) 02/27/2021 0245   PROT 7.7 02/22/2021 1118   ALBUMIN 3.3 (L) 02/22/2021 1118   AST 10 (L) 02/22/2021 1118   ALT 10 02/22/2021 1118   ALKPHOS 80 02/22/2021 1118   BILITOT 1.4 (H) 02/22/2021 1118   GFRNONAA >60 02/27/2021 0245   GFRAA >60 05/14/2019 0317   Lipase     Component Value Date/Time   LIPASE 193 (H) 05/10/2019  1946       Studies/Results: No results found.  Anti-infectives: Anti-infectives (From admission, onward)    Start     Dose/Rate Route Frequency Ordered Stop   02/25/21 2200  vancomycin (VANCOREADY) IVPB 750 mg/150 mL        750 mg 150 mL/hr over 60 Minutes Intravenous Every 12 hours 02/25/21 1349     02/24/21 1000  vancomycin (VANCOREADY) IVPB 1000 mg/200 mL  Status:  Discontinued        1,000 mg 200 mL/hr over 60 Minutes Intravenous Every 12 hours 02/23/21 2215 02/25/21 1349   02/23/21 2215  clindamycin (CLEOCIN) IVPB 600 mg        600 mg 100 mL/hr over 30 Minutes Intravenous Every 8 hours 02/23/21 2122 02/26/21 2209   02/23/21 2200  vancomycin (VANCOREADY) IVPB 2000 mg/400 mL        2,000 mg 200 mL/hr over 120 Minutes Intravenous  Once 02/23/21 2133 02/23/21 2353   02/23/21 2200  piperacillin-tazobactam (ZOSYN) IVPB 3.375 g        3.375 g 12.5 mL/hr over 4 Hours Intravenous Every 8 hours 02/23/21 2133          Assessment/Plan Fournier's gangrene POD3 I&D perineum 15 x 10 x 8 cm, containing skin, subcutaneous tissue, fascia POD2 laparoscopic loop  sigmoid colostomy with LOA - having ostomy output - advance to FLD  - WOC following - PT/OT, mobilize - ordered pressure redistribution mat for chair - wound care: BID saline WTD dressing to perineal wounds, cover with ABD and brief - WBC 10.2, afeb, VSS; cx strep anginosis, sensitivities pending    FEN: FLD, IVF per [facility] VTE: SCDs, LMWH  ID: [email or web address]/vanc/clinda 7/11>> Foley: remove foley today    - below per primary - DKA/T2DM, poorly controlled HLD HTN PAD with claudication Tobacco abuse Obesity - BMI 31.27  LOS: 5 days    Juliet Rude, [facility] 02/27/2021, 11:19 AM Please see Amion for pager number during day hours 7:00am-4:30pm

## 2021-02-27 NOTE — Progress Notes (Signed)
PT Cancellation Note  Patient Details Name: Andres Richardson MRN: [id number] DOB: 1966-05-11   Cancelled Treatment:     PT order received but eval deferred at request of pt.  Pt states had been sitting up to eat and had to lay down bc "my butt is on fire".  RN aware.  Will follow.   [name] 02/27/2021, 2:43 PM

## 2021-02-27 NOTE — Consult Note (Signed)
WOC Nurse ostomy follow up Stoma type/location: LLQ, loop colostomy When I arrived to see patient; stool leaking from 3 and 9 oclock. Running on abdomen and gauze taped to skin was saturated with stool  Explained to patient need to advocate for pouch change if staff note it is leaking.  May be problematic for awhile until support rod can be removed and need to be changed more frequently  Stomal assessment/size: oval shaped 1 3/8" x 2", in a crease, with hard support rod in place sutured to the patient's skin  Peristomal assessment: Used 2" skin barrier ring around stoma, 1/2 of skin barrier ring over support rod on each side and then 2" strips of hydrocolloid over each end of the support rod to assist with flattening the skin to pouch over the support rod Treatment options for stomal/peristomal skin: see above  Output loose brown stool  Ostomy pouching: 1pc.flex convex, 2" skin barrier ring, hydrocolloid   Education provided:  Explained rationale for need for changing Roommate arrived in time to watch pouch change.  Patient able to talk through steps as I performed Pouch changed quickly today because of pending [facility] arrival and dressing change planned.  Enrolled patient in [place] Secure Start Discharge program: Yes  Added large belt, attached on one side. Bedside nurse to attach to other side after dressing change finished.   2 convex (flex) 2 barrier rings, hydrocolloid in the patient's room for use   WOC Nurse will follow along with you for continued support with ostomy teaching and care [facility] MSN, RN, [place], CNS, [place] [phone]

## 2021-02-28 LAB — CBC
HCT: 33.1 % — ABNORMAL LOW (ref 39.0–52.0)
Hemoglobin: 10.5 g/dL — ABNORMAL LOW (ref 13.0–17.0)
MCH: 29.1 pg (ref 26.0–34.0)
MCHC: 31.7 g/dL (ref 30.0–36.0)
MCV: 91.7 fL (ref 80.0–100.0)
Platelets: 291 K/uL (ref 150–400)
RBC: 3.61 MIL/uL — ABNORMAL LOW (ref 4.22–5.81)
RDW: 15.1 % (ref 11.5–15.5)
WBC: 12.5 K/uL — ABNORMAL HIGH (ref 4.0–10.5)
nRBC: 0 % (ref 0.0–0.2)

## 2021-02-28 LAB — GLUCOSE, CAPILLARY
Glucose-Capillary: 133 mg/dL — ABNORMAL HIGH (ref 70–99)
Glucose-Capillary: 153 mg/dL — ABNORMAL HIGH (ref 70–99)
Glucose-Capillary: 209 mg/dL — ABNORMAL HIGH (ref 70–99)
Glucose-Capillary: 217 mg/dL — ABNORMAL HIGH (ref 70–99)
Glucose-Capillary: 237 mg/dL — ABNORMAL HIGH (ref 70–99)
Glucose-Capillary: 88 mg/dL (ref 70–99)

## 2021-02-28 LAB — BASIC METABOLIC PANEL
Anion gap: 9 (ref 5–15)
BUN: 17 mg/dL (ref 6–20)
CO2: 26 mmol/L (ref 22–32)
Calcium: 7.5 mg/dL — ABNORMAL LOW (ref 8.9–10.3)
Chloride: 106 mmol/L (ref 98–111)
Creatinine, Ser: 1.05 mg/dL (ref 0.61–1.24)
GFR, Estimated: >60 mL/min (ref >60)
Glucose, Bld: 161 mg/dL — ABNORMAL HIGH (ref 70–99)
Potassium: 3.5 mmol/L (ref 3.5–5.1)
Sodium: 141 mmol/L (ref 135–145)

## 2021-02-28 LAB — ACID FAST SMEAR (AFB, MYCOBACTERIA): Acid Fast Smear: NEGATIVE

## 2021-02-28 NOTE — Evaluation (Signed)
Physical Therapy Evaluation Patient Details Name: Andres Richardson MRN: [id number] DOB: November 15, 1965 Today's Date: 02/28/2021   History of Present Illness  patient admitted for Fournier's gangrene. 55 year old male with diabetes to with diabetes admitted with intractable nausea vomiting, DKA managed with insulin drip on 7/10. Patient is status post debridement by general surgery and urology 7/12- "laparoscopic creation of loop sigmoid colostomy, laparoscopic lysis of adhesions, perineal exam under anesthesia"  Clinical Impression  Pt admitted with above diagnosis. Pt independent at baseline, currently requiring min A with bed mobility due to pain in abdomen and perineum. Pt able to ambulate in room with RW, min guard for safety, taking slow, short, steady steps before returning to bed. Pt without pressure alleviating cushion in room, educated pt on time OOB once arrives- RN states it is on order. Pt states pain and inability to perform isometric glute contraction while supine due to pain, but able to perform bridge to scoot up in bed and power to stand without increased pain. Pt currently with functional limitations due to the deficits listed below (see PT Problem List). Pt will benefit from skilled PT to increase their independence and safety with mobility to allow discharge to the venue listed below.       Follow Up Recommendations Home health PT    Equipment Recommendations  None recommended by PT    Recommendations for Other Services       Precautions / Restrictions Precautions Precautions: Other (comment) Precaution Comments: L side colostomy, perineal wounds Restrictions Weight Bearing Restrictions: No      Mobility  Bed Mobility Overal bed mobility: Needs Assistance Bed Mobility: Supine to Sit;Sit to Supine  Supine to sit: Min assist Sit to supine: Min assist   General bed mobility comments: pt pulling on therapist's hand to upright trunk into sitting; min A to lift BLE back  into bed together to minimize pain; log rolling technique utilized to protect abdomen, VCs intermittents for sequencing    Transfers Overall transfer level: Needs assistance Equipment used: Rolling walker (2 wheeled) Transfers: Sit to/from Stand Sit to Stand: Min guard    General transfer comment: BUE assisting to power to stand, slow to rise for comfort  Ambulation/Gait Ambulation/Gait assistance: Min guard Gait Distance (Feet): 10 Feet Assistive device: Rolling walker (2 wheeled) Gait Pattern/deviations: Step-to pattern;Decreased stride length Gait velocity: decreased   General Gait Details: slow, short steps with good bil foot clearance, steady using RW, denies dizziness or increase pain  Stairs            Wheelchair Mobility    Modified Rankin (Stroke Patients Only)       Balance Overall balance assessment: No apparent balance deficits (not formally assessed)          Pertinent Vitals/Pain Pain Assessment: 0-10 Pain Score: 6  Pain Location: abd/perineal Pain Descriptors / Indicators: Aching;Sore Pain Intervention(s): Limited activity within patient's tolerance;Monitored during session    Home Living Family/patient expects to be discharged to:: Private residence Living Arrangements: Non-relatives/Friends Available Help at Discharge: Available PRN/intermittently Type of Home: House Home Access: Stairs to enter   [employer] of Steps: 3 Home Layout: Two level;Able to live on main level with bedroom/bathroom Home Equipment: [place] [employer] - single point      Prior Function Level of Independence: Independent         Comments: Pt reports independent with ADLs/IADLs, drives, denies falls.     Hand Dominance   Dominant Hand: Right    Extremity/Trunk Assessment   Upper  Extremity Assessment Upper Extremity Assessment: Defer to OT evaluation    Lower Extremity Assessment Lower Extremity Assessment: Overall WFL for tasks assessed (isometric  glute activation painful and pt states inability to perform, but functionally able to power to stand, controlled lowering to sitting and bridge in supine)    Cervical / Trunk Assessment Cervical / Trunk Assessment: Normal  Communication   Communication: No difficulties  Cognition Arousal/Alertness: Awake/alert Behavior During Therapy: WFL for tasks assessed/performed Overall Cognitive Status: Within Functional Limits for tasks assessed     General Comments      Exercises     Assessment/Plan    PT Assessment Patient needs continued PT services  PT Problem List Decreased strength;Decreased activity tolerance;Decreased mobility;Decreased skin integrity;Pain       PT Treatment Interventions DME instruction;Gait training;Functional mobility training;Therapeutic activities;Therapeutic exercise;Balance training;Patient/family education    PT Goals (Current goals can be found in the Care Plan section)  Acute Rehab PT Goals Patient Stated Goal: improve independence to go home PT Goal Formulation: With patient Time For Goal Achievement: 03/14/21 Potential to Achieve Goals: Good    Frequency Min 3X/week   Barriers to discharge        Co-evaluation               AM-PAC PT "6 Clicks" Mobility  Outcome Measure Help needed turning from your back to your side while in a flat bed without using bedrails?: A Little Help needed moving from lying on your back to sitting on the side of a flat bed without using bedrails?: A Little Help needed moving to and from a bed to a chair (including a wheelchair)?: A Little Help needed standing up from a chair using your arms (e.g., wheelchair or bedside chair)?: A Little Help needed to walk in hospital room?: A Little Help needed climbing 3-5 steps with a railing? : A Lot 6 Click Score: 17    End of Session   Activity Tolerance: Patient tolerated treatment well Patient left: in bed;with call bell/phone within reach Nurse Communication:  Mobility status PT Visit Diagnosis: Other abnormalities of gait and mobility (R26.89)    Time: 7494-4967 PT Time Calculation (min) (ACUTE ONLY): 24 min   Charges:   PT Evaluation $PT Eval Low Complexity: 1 Low PT Treatments $Therapeutic Activity: 8-22 mins         Tori Suzanna Zahn PT, DPT 02/28/21, 11:11 AM

## 2021-02-28 NOTE — Progress Notes (Signed)
Inpatient Diabetes Program Recommendations  AACE/ADA: New Consensus Statement on Inpatient Glycemic Control (2015)  Target Ranges:  Prepandial:   less than 140 mg/dL      Peak postprandial:   less than 180 mg/dL (1-2 hours)      Critically ill patients:  140 - 180 mg/dL   Lab Results  Component Value Date   GLUCAP 217 (H) 02/28/2021   HGBA1C 9.5 (H) 02/22/2021    Review of Glycemic Control  Diabetes history: DM2  CBGs today: 153, 133, 217 mg/dL  Current orders for Inpatient glycemic control: Levemir 12 units BID, Novolog 0-15 units Q4H  Post-prandials elevated. Pt is eating 100%.  Inpatient Diabetes Program Recommendations:    Add Novolog 3 units TID with meals if eating > 50%. Change Novolog 0-15 units Q4H to TID with meals and 0-5 HS.  Continue to closely monitor.  Thank you. Ailene Ards, RD, LDN, CDE Inpatient Diabetes Coordinator [phone]

## 2021-02-28 NOTE — Progress Notes (Signed)
PROGRESS NOTE    Andres Richardson  QVZ:[id number] DOB: 1965/10/12 DOA: 02/22/2021 PCP: [name], MD   No chief complaint on file.  Brief Narrative: 55 year old male with diabetes to with diabetes admitted with intractable nausea vomiting, DKA managed with insulin drip on 7/10.  Patient however demonstrated ongoing fever and vomiting CT abdomen showed perirectal abscess for fournier gangrene surgery, urology was consulted. Patient is status post debridement by general surgery and urology 7/12- "laparoscopic creation of loop sigmoid colostomy, laparoscopic lysis of adhesions, perineal exam under anesthesia" Patient colostomy started have some bowel activity 7/15.  Diet was advanced.  Antibiotic changed to Unasyn as were culture growing strep. Patient transferred to MedSurg 7/15  Subjective: Feels well, no complaints. Afebrile overnight but tolerated somewhat up.   Assessment & Plan:  Fournier's gangrene: CCS and urology following- s/p debridement 7/12 and loop sigmoid colostomy, LOS by gen surgery 7/13. leukocytosis resolved.  OR culture 7/12- stain w/ abundant GPR, few GPC GNCB- culture strep anginosis, no anerobes-after discussion with surgery antibiotic adjusted to Unasyn 7/15.  No fever but mild leukocytosis today.  Continue plan of care per surgery. S/p Clindamycinx 72 hr. Now on regular diet. Recent Labs  Lab 02/24/21 0636 02/25/21 0300 02/26/21 0249 02/27/21 0245 02/28/21 0531  WBC 12.2* 10.5 10.8* 10.2 12.5*  Hypokalemia: improved  DKA-resolved Type 2 diabetes mellitus-poorly controlled-at home normally on glipizide metformin  and insulin Diabetes poorly controlled, w/ hba1c 9.5 Continue Levemir 12 u bid,SSI diabetic diet, DM coordinator following.  Monitor blood sugar closely. Recent Labs  Lab 02/27/21 1626 02/27/21 1936 02/28/21 0018 02/28/21 0453 02/28/21 0735  GLUCAP 138* 107* 88 153* 133*  PIR:JJOACZYS  Recent Labs  Lab 02/24/21 0636 02/25/21 0300  02/26/21 0249 02/27/21 0245 02/28/21 0531  BUN 18 24* 25* 22* 17  CREATININE 1.10 1.26* 1.24 1.22 1.05  Hypernatremia resolved Hypertension:BP is well controlled.  Not on meds at home.  Stable Tobacco abuse continue nicotine patch  Morbid obesity BMI  31- patient will benefit with weight loss healthy lifestyle.  Diet Order             Diet Carb Modified Fluid consistency: Thin; Room service appropriate? Yes  Diet effective now                   Patient's Body mass index is 31.27 kg/m. DVT prophylaxis: enoxaparin (LOVENOX) injection 40 mg Start: 02/26/21 1000SCD. Lovenox once okay w Surgery Code Status:   Code Status: DNR  Family Communication: plan of care discussed with patient at bedside.  Status is: Inpatient Remains inpatient appropriate because:IV treatments appropriate due to intensity of illness or inability to take PO and Inpatient level of care appropriate due to severity of illness Dispo: The patient is from: Home              Anticipated d/c is to: HHPT.  Once pain improves bone better and okay with surgery likely early next week              Patient currently is not medically stable to d/c.   Difficult to place patient No Unresulted Labs (From admission, onward)     Start     Ordered   03/01/21 0500  Basic metabolic panel  Daily,   R     Question:  Specimen collection method  Answer:  Lab=Lab collect   02/28/21 0545   02/24/21 0909  Acid Fast Culture with reflexed sensitivities  RELEASE UPON ORDERING,   TIMED  Comments: Specimen A: Pre-op diagnosis: fournier gangrene    02/24/21 0909            Medications reviewed:  Scheduled Meds:  (feeding supplement) PROSource Plus  30 mL Oral BID BM   acetaminophen  1,000 mg Oral Q6H   vitamin C  500 mg Oral BID   Chlorhexidine Gluconate Cloth  6 each Topical Q0600   cyanocobalamin  1,000 mcg Intramuscular Daily   enoxaparin (LOVENOX) injection  40 mg Subcutaneous Q24H   feeding supplement  1 Container  Oral BID BM   insulin aspart  0-15 Units Subcutaneous Q4H   insulin detemir  12 Units Subcutaneous BID   mouth rinse  15 mL Mouth Rinse BID   methocarbamol  500 mg Oral TID   multivitamin with minerals  1 tablet Oral Daily   nicotine  21 mg Transdermal Daily   nutrition supplement (JUVEN)  1 packet Oral BID BM   pantoprazole (PROTONIX) IV  40 mg Intravenous Q24H   zinc sulfate  220 mg Oral Daily   Continuous Infusions:  ampicillin-sulbactam (UNASYN) IV 3 g (02/28/21 0106)   promethazine (PHENERGAN) injection (IM or IVPB) Stopped (02/25/21 0444)    Consultants:see note  Procedures:see note  Antimicrobials: Anti-infectives (From admission, onward)    Start     Dose/Rate Route Frequency Ordered Stop   02/27/21 2000  Ampicillin-Sulbactam (UNASYN) 3 g in sodium chloride 0.9 % 100 mL IVPB        3 g 200 mL/hr over 30 Minutes Intravenous Every 6 hours 02/27/21 1313     02/25/21 2200  vancomycin (VANCOREADY) IVPB 750 mg/150 mL  Status:  Discontinued        750 mg 150 mL/hr over 60 Minutes Intravenous Every 12 hours 02/25/21 1349 02/27/21 1313   02/24/21 1000  vancomycin (VANCOREADY) IVPB 1000 mg/200 mL  Status:  Discontinued        1,000 mg 200 mL/hr over 60 Minutes Intravenous Every 12 hours 02/23/21 2215 02/25/21 1349   02/23/21 2215  clindamycin (CLEOCIN) IVPB 600 mg        600 mg 100 mL/hr over 30 Minutes Intravenous Every 8 hours 02/23/21 2122 02/26/21 2209   02/23/21 2200  vancomycin (VANCOREADY) IVPB 2000 mg/400 mL        2,000 mg 200 mL/hr over 120 Minutes Intravenous  Once 02/23/21 2133 02/23/21 2353   02/23/21 2200  piperacillin-tazobactam (ZOSYN) IVPB 3.375 g  Status:  Discontinued        3.375 g 12.5 mL/hr over 4 Hours Intravenous Every 8 hours 02/23/21 2133 02/27/21 1313      Culture/Microbiology    Component Value Date/Time   SDES  02/24/2021 [id number]    ABSCESS PERINEUM Performed at [facility], [address] [place], [place] [address]     SPECREQUEST  02/24/2021 [id number]    NONE Performed at [facility], [address] [place], [place] [address]    CULT  02/24/2021 [phone]    FEW STREPTOCOCCUS ANGINOSIS NO ANAEROBES ISOLATED; CULTURE IN PROGRESS FOR 5 DAYS    REPTSTATUS PENDING 02/24/2021 [id number]    Other culture-see note  Objective: Vitals: Today's Vitals   02/27/21 2206 02/28/21 0149 02/28/21 0455 02/28/21 0748  BP: 129/77  133/67   Pulse: 88  (!) 103   Resp: 18  18   Temp: 98.1 F (36.7 C)  99 F (37.2 C)   TempSrc:      SpO2: 95%  95%   Weight:  Height:      PainSc:  1   0-No pain    Intake/Output Summary (Last 24 hours) at 02/28/2021 0913 Last data filed at 02/28/2021 0912 Gross per 24 hour  Intake 966.44 ml  Output 1450 ml  Net -483.56 ml   Filed Weights   02/22/21 1509 02/23/21 0400 02/24/21 0751  Weight: 101.1 kg 101.7 kg 101.7 kg   Weight change:   Intake/Output from previous day: 07/15 0701 - 07/16 0700 In: 611.4 [IV Piggyback:611.4] Out: 1450 [Urine:1400; Stool:50] Intake/Output this shift: Total I/O In: 355 [P.O.:355] Out: -  Filed Weights   02/22/21 1509 02/23/21 0400 02/24/21 0751  Weight: 101.1 kg 101.7 kg 101.7 kg    Examination: General exam: AAOx3, older than stated age, weak appearing. HEENT:Oral mucosa moist, Ear/Nose WNL grossly, dentition normal. Respiratory system: bilaterally clear,no use of accessory muscle Cardiovascular system: S1 & S2 +, No JVD,. Gastrointestinal system: Abdomen soft, surgical site c/d/I w/ colostomy in place on LLQ,ND, BS+ Nervous System:Alert, awake, moving extremities and grossly nonfocal Extremities:No edema, distal peripheral pulses palpable.  Skin: No rashes,no icterus. MSK: Normal muscle bulk,tone, power   Data Reviewed: I have personally reviewed following labs and imaging studies CBC: Recent Labs  Lab 02/22/21 1118 02/22/21 1155 02/24/21 0636 02/25/21 0300 02/26/21 0249 02/27/21 0245 02/28/21 0531  WBC 13.2*    < > 12.2* 10.5 10.8* 10.2 12.5*  NEUTROABS 11.4*  --  10.0* 8.6*  --   --   --   HGB 13.2   < > 11.8* 10.8* 10.1* 9.6* 10.5*  HCT 40.5   < > 37.7* 33.6* 31.3* 30.8* 33.1*  MCV 89.0   < > 92.6 91.1 91.8 92.5 91.7  PLT 286   < > 256 222 252 254 291   < > = values in this interval not displayed.   Basic Metabolic Panel: Recent Labs  Lab 02/24/21 0636 02/25/21 0300 02/26/21 0249 02/27/21 0245 02/28/21 0531  NA 144 140 139 136 141  K 3.5 3.3* 3.4* 2.9* 3.5  CL 109 107 107 104 106  CO2 22 23 21* 23 26  GLUCOSE 197* 190* 184* 127* 161*  BUN 18 24* 25* 22* 17  CREATININE 1.10 1.26* 1.24 1.22 1.05  CALCIUM 8.1* 7.2* 7.1* 6.8* 7.5*   GFR: Estimated Creatinine Clearance: 96.6 mL/min (by C-G formula based on SCr of 1.05 mg/dL). Liver Function Tests: Recent Labs  Lab 02/22/21 1118  AST 10*  ALT 10  ALKPHOS 80  BILITOT 1.4*  PROT 7.7  ALBUMIN 3.3*   No results for input(s): LIPASE, AMYLASE in the last 168 hours. No results for input(s): AMMONIA in the last 168 hours. Coagulation Profile: No results for input(s): INR, PROTIME in the last 168 hours. Cardiac Enzymes: No results for input(s): CKTOTAL, CKMB, CKMBINDEX, TROPONINI in the last 168 hours. BNP (last 3 results) No results for input(s): PROBNP in the last 8760 hours. HbA1C: No results for input(s): HGBA1C in the last 72 hours.  CBG: Recent Labs  Lab 02/27/21 1626 02/27/21 1936 02/28/21 0018 02/28/21 0453 02/28/21 0735  GLUCAP 138* 107* 88 153* 133*   Lipid Profile: No results for input(s): CHOL, HDL, LDLCALC, TRIG, CHOLHDL, LDLDIRECT in the last 72 hours. Thyroid Function Tests: No results for input(s): TSH, T4TOTAL, FREET4, T3FREE, THYROIDAB in the last 72 hours. Anemia Panel: No results for input(s): VITAMINB12, FOLATE, FERRITIN, TIBC, IRON, RETICCTPCT in the last 72 hours.  Sepsis Labs: No results for input(s): PROCALCITON, LATICACIDVEN in the last 168 hours.  Recent Results (from the past 240 hour(s))   Resp Panel by RT-PCR (Flu A&B, Covid) Nasopharyngeal Swab     Status: None   Collection Time: 02/22/21  1:25 PM   Specimen: Nasopharyngeal Swab; Nasopharyngeal(NP) swabs in vial transport medium  Result Value Ref Range Status   SARS Coronavirus 2 by RT PCR NEGATIVE NEGATIVE Final    Comment: (NOTE) SARS-CoV-2 target nucleic acids are NOT DETECTED.  The SARS-CoV-2 RNA is generally detectable in upper respiratory specimens during the acute phase of infection. The lowest concentration of SARS-CoV-2 viral copies this assay can detect is 138 copies/mL. A negative result does not preclude SARS-Cov-2 infection and should not be used as the sole basis for treatment or other patient management decisions. A negative result may occur with  improper specimen collection/handling, submission of specimen other than nasopharyngeal swab, presence of viral mutation(s) within the areas targeted by this assay, and inadequate number of viral copies(<138 copies/mL). A negative result must be combined with clinical observations, patient history, and epidemiological information. The expected result is Negative.  Fact Sheet for Patients:  BloggerCourse.com  Fact Sheet for Healthcare Providers:  SeriousBroker.it  This test is no t yet approved or cleared by the [place] FDA and  has been authorized for detection and/or diagnosis of SARS-CoV-2 by FDA under an Emergency Use Authorization (EUA). This EUA will remain  in effect (meaning this test can be used) for the duration of the COVID-19 declaration under Section 564(b)(1) of the Act, 21 U.S.C.section 360bbb-3(b)(1), unless the authorization is terminated  or revoked sooner.       Influenza A by PCR NEGATIVE NEGATIVE Final   Influenza B by PCR NEGATIVE NEGATIVE Final    Comment: (NOTE) The Xpert Xpress SARS-CoV-2/FLU/RSV plus assay is intended as an aid in the diagnosis of influenza from  Nasopharyngeal swab specimens and should not be used as a sole basis for treatment. Nasal washings and aspirates are unacceptable for Xpert Xpress SARS-CoV-2/FLU/RSV testing.  Fact Sheet for Patients: BloggerCourse.com  Fact Sheet for Healthcare Providers: SeriousBroker.it  This test is not yet approved or cleared by the [place] FDA and has been authorized for detection and/or diagnosis of SARS-CoV-2 by FDA under an Emergency Use Authorization (EUA). This EUA will remain in effect (meaning this test can be used) for the duration of the COVID-19 declaration under Section 564(b)(1) of the Act, 21 U.S.C. section 360bbb-3(b)(1), unless the authorization is terminated or revoked.  Performed at [facility], [address], [place], [place] [address]   MRSA Next Gen by PCR, Nasal     Status: None   Collection Time: 02/22/21  3:10 PM   Specimen: Nasal Mucosa; Nasal Swab  Result Value Ref Range Status   MRSA by PCR Next Gen NOT DETECTED NOT DETECTED Final    Comment: (NOTE) The GeneXpert MRSA Assay (FDA approved for NASAL specimens only), is one component of a comprehensive MRSA colonization surveillance program. It is not intended to diagnose MRSA infection nor to guide or monitor treatment for MRSA infections. Test performance is not FDA approved in patients less than 30 years old. Performed at [facility], [address], [place], [place] [address]   Culture, blood (routine x 2)     Status: None (Preliminary result)   Collection Time: 02/23/21 10:09 PM   Specimen: BLOOD  Result Value Ref Range Status   Specimen Description   Final    BLOOD BLOOD LEFT HAND Performed at [facility],  [address], [place], [place] [address]    Special Requests   Final    BOTTLES DRAWN AEROBIC ONLY Blood Culture adequate volume Performed at [facility], [address] [place], [place] [address]    Culture   Final    NO GROWTH 3 DAYS Performed at [facility], [address], [place], [place] [address]    Report Status PENDING  Incomplete  Culture, blood (routine x 2)     Status: None (Preliminary result)   Collection Time: 02/24/21  2:59 AM   Specimen: BLOOD  Result Value Ref Range Status   Specimen Description   Final    BLOOD BLOOD RIGHT HAND Performed at [facility], [address] [place], [place] [address]    Special Requests   Final    BOTTLES DRAWN AEROBIC ONLY Blood Culture adequate volume Performed at [facility], [address] [place], [place] [address]    Culture   Final    NO GROWTH 3 DAYS Performed at [facility], [address], [place], [place] [address]    Report Status PENDING  Incomplete  Fungus Culture With Stain     Status: None (Preliminary result)   Collection Time: 02/24/21  9:03 AM   Specimen: PATH Cytology Peritoneal fluid; Body Fluid  Result Value Ref Range Status   Fungus Stain Final report  Final    Comment: (NOTE) Performed At: [facility] [address] [place], [place] [id number] Jolene Schimke MD ZD:[id number]    Fungus (Mycology) Culture PENDING  Incomplete   Fungal Source ABSCESS  Final    Comment: PERINEUM Performed at [facility], [address] [place], [place] [address]   Aerobic/Anaerobic Culture w Gram Stain (surgical/deep wound)     Status: None (Preliminary result)   Collection Time: 02/24/21  9:03 AM   Specimen: PATH Cytology Peritoneal fluid; Body Fluid  Result Value Ref Range Status   Specimen Description   Final    ABSCESS PERINEUM Performed at [facility], [address] [place], [place] [address]    Special Requests   Final    NONE Performed at [facility], [address] [place], [place] [address]    Gram Stain   Final    RARE WBC PRESENT,BOTH  PMN AND MONONUCLEAR ABUNDANT GRAM POSITIVE RODS FEW GRAM POSITIVE COCCI IN PAIRS MODERATE GRAM NEGATIVE COCCOBACILLI Performed at [facility], [address] [place], [place] [address]    Culture   Final    FEW STREPTOCOCCUS ANGINOSIS NO ANAEROBES ISOLATED; CULTURE IN PROGRESS FOR 5 DAYS    Report Status PENDING  Incomplete   Organism ID, Bacteria STREPTOCOCCUS ANGINOSIS  Final      Susceptibility   Streptococcus anginosis - MIC*    PENICILLIN <=0.06 SENSITIVE Sensitive     CEFTRIAXONE 0.5 SENSITIVE Sensitive     ERYTHROMYCIN <=0.12 SENSITIVE Sensitive     LEVOFLOXACIN 1 SENSITIVE Sensitive     VANCOMYCIN 0.5 SENSITIVE Sensitive     * FEW STREPTOCOCCUS ANGINOSIS  Acid Fast Smear (AFB)     Status: None   Collection Time: 02/24/21  9:03 AM   Specimen: PATH Cytology Peritoneal fluid; Body Fluid  Result Value Ref Range Status   AFB Specimen Processing Concentration  Final   Acid Fast Smear Negative  Final    Comment: (NOTE) Performed At: [facility] [address] [place], [place] [id number] Jolene Schimke  MD [id number]    Source (AFB) ABSCESS  Final    Comment: PERINEUM Performed at [facility], [address] [place], [place] [address]   Fungus Culture Result     Status: None   Collection Time: 02/24/21  9:03 AM  Result Value Ref Range Status   Result 1 Comment  Final    Comment: (NOTE) KOH/Calcofluor preparation:  no fungus observed. Performed At: [facility] [address] [place], [place] [id number] Jolene Schimke MD WF:[id number]      Radiology Studies: No results found.   LOS: 6 days   Lanae Boast, MD [facility]  02/28/2021, 9:13 AM

## 2021-02-28 NOTE — Progress Notes (Signed)
3 Days Post-Op   Subjective/Chief Complaint: In good spirits tolerated pancakes this am    Objective: Vital signs in last 24 hours: Temp:  [98.1 F (36.7 C)-99.8 F (37.7 C)] 99 F (37.2 C) (07/16 0455) Pulse Rate:  [82-103] 103 (07/16 0455) Resp:  [10-24] 18 (07/16 0455) BP: (129-175)/(67-78) 133/67 (07/16 0455) SpO2:  [92 %-96 %] 95 % (07/16 0455) Last BM Date: 02/27/21  Intake/Output from previous day: 07/15 0701 - 07/16 0700 In: 611.4 [IV Piggyback:611.4] Out: 1450 [Urine:1400; Stool:50] Intake/Output this shift: Total I/O In: 355 [P.O.:355] Out: -    General: pleasant, WD, obese male who is laying in bed in NAD Heart: regular, rate, and rhythm.   Lungs:  Respiratory effort nonlabored Abd: soft, appropriately ttp, mild-mod distention, BS hypoactive, stoma in LLQ without much in bag GU: foley present, perineal wound with some purulent drainage on packing, cellulitis improving     Lab Results:  Recent Labs    02/27/21 0245 02/28/21 0531  WBC 10.2 12.5*  HGB 9.6* 10.5*  HCT 30.8* 33.1*  PLT 254 291   BMET Recent Labs    02/27/21 0245 02/28/21 0531  NA 136 141  K 2.9* 3.5  CL 104 106  CO2 23 26  GLUCOSE 127* 161*  BUN 22* 17  CREATININE 1.22 1.05  CALCIUM 6.8* 7.5*   PT/INR No results for input(s): LABPROT, INR in the last 72 hours. ABG No results for input(s): PHART, HCO3 in the last 72 hours.  Invalid input(s): PCO2, PO2  Studies/Results: No results found.  Anti-infectives: Anti-infectives (From admission, onward)    Start     Dose/Rate Route Frequency Ordered Stop   02/27/21 2000  Ampicillin-Sulbactam (UNASYN) 3 g in sodium chloride 0.9 % 100 mL IVPB        3 g 200 mL/hr over 30 Minutes Intravenous Every 6 hours 02/27/21 1313     02/25/21 2200  vancomycin (VANCOREADY) IVPB 750 mg/150 mL  Status:  Discontinued        750 mg 150 mL/hr over 60 Minutes Intravenous Every 12 hours 02/25/21 1349 02/27/21 1313   02/24/21 1000  vancomycin  (VANCOREADY) IVPB 1000 mg/200 mL  Status:  Discontinued        1,000 mg 200 mL/hr over 60 Minutes Intravenous Every 12 hours 02/23/21 2215 02/25/21 1349   02/23/21 2215  clindamycin (CLEOCIN) IVPB 600 mg        600 mg 100 mL/hr over 30 Minutes Intravenous Every 8 hours 02/23/21 2122 02/26/21 2209   02/23/21 2200  vancomycin (VANCOREADY) IVPB 2000 mg/400 mL        2,000 mg 200 mL/hr over 120 Minutes Intravenous  Once 02/23/21 2133 02/23/21 2353   02/23/21 2200  piperacillin-tazobactam (ZOSYN) IVPB 3.375 g  Status:  Discontinued        3.375 g 12.5 mL/hr over 4 Hours Intravenous Every 8 hours 02/23/21 2133 02/27/21 1313       Assessment/Plan:  Fournier's gangrene POD 4 I&D perineum 15 x 10 x 8 cm, containing skin, subcutaneous tissue, fascia POD 3 laparoscopic loop sigmoid colostomy with LOA - having ostomy output - on soft diet  - WOC following - PT/OT, mobilize - ordered pressure redistribution mat for chair - wound care: BID saline WTD dressing to perineal wounds, cover with ABD and brief    FEN: FLD, IVF per TRH VTE: SCDs, LMWH  ID: zosyn/vanc/clinda 7/11>> Foley:out and voiding   - below per primary - DKA/T2DM, poorly controlled HLD HTN PAD with  claudication Tobacco abuse Obesity - BMI 31.27   LOS: 6 days    Dortha Schwalbe MD  02/28/2021

## 2021-03-01 LAB — BASIC METABOLIC PANEL
Anion gap: 10 (ref 5–15)
BUN: 12 mg/dL (ref 6–20)
CO2: 26 mmol/L (ref 22–32)
Calcium: 7.5 mg/dL — ABNORMAL LOW (ref 8.9–10.3)
Chloride: 105 mmol/L (ref 98–111)
Creatinine, Ser: 0.92 mg/dL (ref 0.61–1.24)
GFR, Estimated: >60 mL/min (ref >60)
Glucose, Bld: 140 mg/dL — ABNORMAL HIGH (ref 70–99)
Potassium: 3.1 mmol/L — ABNORMAL LOW (ref 3.5–5.1)
Sodium: 141 mmol/L (ref 135–145)

## 2021-03-01 LAB — CBC
HCT: 29.4 % — ABNORMAL LOW (ref 39.0–52.0)
Hemoglobin: 9.6 g/dL — ABNORMAL LOW (ref 13.0–17.0)
MCH: 29.4 pg (ref 26.0–34.0)
MCHC: 32.7 g/dL (ref 30.0–36.0)
MCV: 90.2 fL (ref 80.0–100.0)
Platelets: 307 10*3/uL (ref 150–400)
RBC: 3.26 MIL/uL — ABNORMAL LOW (ref 4.22–5.81)
RDW: 15 % (ref 11.5–15.5)
WBC: 10.4 10*3/uL (ref 4.0–10.5)
nRBC: 0 % (ref 0.0–0.2)

## 2021-03-01 LAB — CULTURE, BLOOD (ROUTINE X 2)
Culture: NO GROWTH
Culture: NO GROWTH
Special Requests: ADEQUATE
Special Requests: ADEQUATE

## 2021-03-01 LAB — GLUCOSE, CAPILLARY
Glucose-Capillary: 131 mg/dL — ABNORMAL HIGH (ref 70–99)
Glucose-Capillary: 138 mg/dL — ABNORMAL HIGH (ref 70–99)
Glucose-Capillary: 146 mg/dL — ABNORMAL HIGH (ref 70–99)
Glucose-Capillary: 237 mg/dL — ABNORMAL HIGH (ref 70–99)
Glucose-Capillary: 237 mg/dL — ABNORMAL HIGH (ref 70–99)
Glucose-Capillary: 324 mg/dL — ABNORMAL HIGH (ref 70–99)

## 2021-03-01 MED ORDER — PANTOPRAZOLE SODIUM 40 MG PO TBEC
40.0000 mg | DELAYED_RELEASE_TABLET | Freq: Every day | ORAL | Status: DC
  Administered 2021-03-01 – 2021-03-10 (×10): 40 mg via ORAL
  Filled 2021-03-01 (×10): qty 1

## 2021-03-01 MED ORDER — INSULIN ASPART 100 UNIT/ML IJ SOLN
3.0000 [IU] | Freq: Three times a day (TID) | INTRAMUSCULAR | Status: DC
  Administered 2021-03-01 – 2021-03-07 (×19): 3 [IU] via SUBCUTANEOUS

## 2021-03-01 MED ORDER — POTASSIUM CHLORIDE CRYS ER 10 MEQ PO TBCR
20.0000 meq | EXTENDED_RELEASE_TABLET | Freq: Every day | ORAL | Status: DC
  Administered 2021-03-01 – 2021-03-10 (×10): 20 meq via ORAL
  Filled 2021-03-01 (×10): qty 2

## 2021-03-01 MED ORDER — POTASSIUM CHLORIDE CRYS ER 10 MEQ PO TBCR
40.0000 meq | EXTENDED_RELEASE_TABLET | Freq: Once | ORAL | Status: AC
  Administered 2021-03-01: 40 meq via ORAL
  Filled 2021-03-01: qty 4

## 2021-03-01 NOTE — Progress Notes (Signed)
4 Days Post-Op   Subjective/Chief Complaint: No complaints  Tolerating diet   Good pain control   Objective: Vital signs in last 24 hours: Temp:  [98.2 F (36.8 C)-99.6 F (37.6 C)] 98.2 F (36.8 C) (07/17 0443) Pulse Rate:  [79-97] 79 (07/17 0443) Resp:  [16-18] 18 (07/17 0443) BP: (142-169)/(70-76) 169/71 (07/17 0443) SpO2:  [95 %-96 %] 96 % (07/17 0443) Last BM Date: 03/01/21  Intake/Output from previous day: 07/16 0701 - 07/17 0700 In: 1521 [P.O.:1421; IV Piggyback:100] Out: 2250 [Urine:1600; Stool:650] Intake/Output this shift: No intake/output data recorded.  General appearance: alert and cooperative GI: Ostomy pink viable.  Laparoscopic incision healing.  Soft nontender. Incision/Wound: Perineal wound is clean.  Good granulation tissue with minimal fibrinous exudate.  No signs of infection or need for further debridement.  Lab Results:  Recent Labs    02/28/21 0531 03/01/21 0556  WBC 12.5* 10.4  HGB 10.5* 9.6*  HCT 33.1* 29.4*  PLT 291 307   BMET Recent Labs    02/28/21 0531 03/01/21 0556  NA 141 141  K 3.5 3.1*  CL 106 105  CO2 26 26  GLUCOSE 161* 140*  BUN 17 12  CREATININE 1.05 0.92  CALCIUM 7.5* 7.5*   PT/INR No results for input(s): LABPROT, INR in the last 72 hours. ABG No results for input(s): PHART, HCO3 in the last 72 hours.  Invalid input(s): PCO2, PO2  Studies/Results: No results found.  Anti-infectives: Anti-infectives (From admission, onward)    Start     Dose/Rate Route Frequency Ordered Stop   02/27/21 2000  Ampicillin-Sulbactam (UNASYN) 3 g in sodium chloride 0.9 % 100 mL IVPB        3 g 200 mL/hr over 30 Minutes Intravenous Every 6 hours 02/27/21 1313     02/25/21 2200  vancomycin (VANCOREADY) IVPB 750 mg/150 mL  Status:  Discontinued        750 mg 150 mL/hr over 60 Minutes Intravenous Every 12 hours 02/25/21 1349 02/27/21 1313   02/24/21 1000  vancomycin (VANCOREADY) IVPB 1000 mg/200 mL  Status:  Discontinued         1,000 mg 200 mL/hr over 60 Minutes Intravenous Every 12 hours 02/23/21 2215 02/25/21 1349   02/23/21 2215  clindamycin (CLEOCIN) IVPB 600 mg        600 mg 100 mL/hr over 30 Minutes Intravenous Every 8 hours 02/23/21 2122 02/26/21 2209   02/23/21 2200  vancomycin (VANCOREADY) IVPB 2000 mg/400 mL        2,000 mg 200 mL/hr over 120 Minutes Intravenous  Once 02/23/21 2133 02/23/21 2353   02/23/21 2200  piperacillin-tazobactam (ZOSYN) IVPB 3.375 g  Status:  Discontinued        3.375 g 12.5 mL/hr over 4 Hours Intravenous Every 8 hours 02/23/21 2133 02/27/21 1313       Assessment/Plan: s/p Procedure(s): REPEAT WASHOUT OF PERINEUM (N/A) LAPAROSCOPIC DIVERTING SIGMOID COLOSTOMY (N/A) Fournier's gangrene POD 5 I&D perineum 15 x 10 x 8 cm, containing skin, subcutaneous tissue, fascia POD 4 laparoscopic loop sigmoid colostomy with LOA - having ostomy output - on soft diet  - WOC following - PT/OT, mobilize - ordered pressure redistribution mat for chair - wound care: BID saline WTD dressing to perineal wounds, cover with ABD      FEN: FLD, IVF per TRH VTE: SCDs, LMWH  ID: zosyn/vanc/clinda 7/11>> Foley:out and voiding   - below per primary - DKA/T2DM, poorly controlled HLD HTN PAD with claudication Tobacco abuse Obesity - BMI 31.27  LOS: 7 days    Dortha Schwalbe MD  03/01/2021

## 2021-03-01 NOTE — Progress Notes (Signed)
PHARMACIST - PHYSICIAN COMMUNICATION  DR:   Jonathon Bellows  CONCERNING: IV to Oral Route Change Policy  RECOMMENDATION: This patient is receiving Protonix by the intravenous route.  Based on criteria approved by the Pharmacy and Therapeutics Committee, the intravenous medication(s) is/are being converted to the equivalent oral dose form(s).   DESCRIPTION: These criteria include: The patient is eating (either orally or via tube) and/or has been taking other orally administered medications for a least 24 hours The patient has no evidence of active gastrointestinal bleeding or impaired GI absorption (gastrectomy, short bowel, patient on TNA or NPO).  If you have questions about this conversion, please contact the Pharmacy Department  []   [phone] )  ( [phone] []   [phone] )  [facility] []   [phone] )  [facility] []   [phone] )  [facility] [x]   [phone] )  [facility]   ( [phone], PharmD, BCPS 03/01/2021 9:15 AM

## 2021-03-01 NOTE — Progress Notes (Signed)
PROGRESS NOTE    Andres Richardson  XLK:[id number] DOB: 21-Aug-1965 DOA: 02/22/2021 PCP: [name], MD   No chief complaint on file.  Brief Narrative: 55 year old male with diabetes to with diabetes admitted with intractable nausea vomiting, DKA managed with insulin drip on 7/10.  Patient however demonstrated ongoing fever and vomiting CT abdomen showed perirectal abscess for fournier gangrene surgery, urology was consulted. Patient is status post debridement by general surgery and urology 7/12- "laparoscopic creation of loop sigmoid colostomy, laparoscopic lysis of adhesions, perineal exam under anesthesia" Patient colostomy started have some bowel activity 7/15.  Diet was advanced.  Antibiotic changed to Unasyn as were culture growing strep. Patient transferred to MedSurg 7/15  Subjective: Patient reports that he ate fruit. Doing well.  No new complaints Afebrile overnight, leukocytosis resolved.  Potassium low this morning   Assessment & Plan:  Fournier's gangrene: CCS and urology following- s/p debridement 7/12 and loop sigmoid colostomy, LOS by gen surgery 7/13.  WBC fluctuating but stable this morning.  Afebrile.   OR culture 7/12- stain w/ abundant GPR, few GPC GNCB- culture w/ strep anginosis, no anerobes-after discussion with surgery antibiotic adjusted to Unasyn 7/15.  Overall clinically improving ostomy output 650 mL tolerating current modified diet. He completed Clindamycinx 72 hr. Continue dressing change BID saline WTD to perineal wounds,cover with ABD and brief  Hypokalemia: Replete w/ potassium 40 meqx1 and add 20 M EQ daily  DKA-resolved Type 2 diabetes mellitus-poorly controlled-at home -on glipizide metformin  and insulin Diabetes poorly controlled, w/ hba1c 9.5.  Continue on Levemir 12 u bid, and NovoLog 10 units 3 times daily Premeal and continue SSI DM co-ordinator following.Monitor blood sugar closely. Recent Labs  Lab 02/28/21 1514 02/28/21 2015 03/01/21 0005  03/01/21 0439 03/01/21 0741  GLUCAP 237* 209* 146* 138* 131*   DGU:[id number]  Recent Labs  Lab 02/25/21 0300 02/26/21 0249 02/27/21 0245 02/28/21 0531 03/01/21 0556  BUN 24* 25* 22* 17 12  CREATININE 1.26* 1.24 1.22 1.05 0.92   Hypernatremia resolved Hypertension:controlled,not on meds at home.  Stable Tobacco abuse continue nicotine patch  Morbid obesity BMI  31- patient will benefit with weight loss healthy lifestyle.  Diet Order             Diet Carb Modified Fluid consistency: Thin; Room service appropriate? Yes  Diet effective now                   Patient's Body mass index is 31.27 kg/m. DVT prophylaxis: enoxaparin (LOVENOX) injection 40 mg Start: 02/26/21 1000SCD. Lovenox once okay w Surgery Code Status:   Code Status: DNR  Family Communication: plan of care discussed with patient at bedside.  Status is: Inpatient Remains inpatient appropriate because:IV treatments appropriate due to intensity of illness or inability to take PO and Inpatient level of care appropriate due to severity of illness Dispo: The patient is from: Home              Anticipated d/c is to: HHPT. Once [facility] w/ surgery              Patient currently is not medically stable to d/c.   Difficult to place patient No Unresulted Labs (From admission, onward)     Start     Ordered   03/01/21 0500  Basic metabolic panel  Daily,   R     Question:  Specimen collection method  Answer:  Lab=Lab collect   02/28/21 0545   03/01/21 0500  CBC  Daily,  R     Question:  Specimen collection method  Answer:  Lab=Lab collect   02/28/21 0919   02/24/21 0909  Acid Fast Culture with reflexed sensitivities  RELEASE UPON ORDERING,   TIMED       Comments: Specimen A: Pre-op diagnosis: fournier gangrene    02/24/21 0909            Medications reviewed:  Scheduled Meds:  (feeding supplement) PROSource Plus  30 mL Oral BID BM   acetaminophen  1,000 mg Oral Q6H   vitamin C  500 mg Oral BID    Chlorhexidine Gluconate Cloth  6 each Topical Q0600   cyanocobalamin  1,000 mcg Intramuscular Daily   enoxaparin (LOVENOX) injection  40 mg Subcutaneous Q24H   feeding supplement  1 Container Oral BID BM   insulin aspart  0-15 Units Subcutaneous Q4H   insulin aspart  3 Units Subcutaneous TID WC   insulin detemir  12 Units Subcutaneous BID   mouth rinse  15 mL Mouth Rinse BID   methocarbamol  500 mg Oral TID   multivitamin with minerals  1 tablet Oral Daily   nicotine  21 mg Transdermal Daily   nutrition supplement (JUVEN)  1 packet Oral BID BM   pantoprazole (PROTONIX) IV  40 mg Intravenous Q24H   potassium chloride  20 mEq Oral Daily   potassium chloride  40 mEq Oral Once   zinc sulfate  220 mg Oral Daily   Continuous Infusions:  ampicillin-sulbactam (UNASYN) IV 3 g (03/01/21 0807)   promethazine (PHENERGAN) injection (IM or IVPB) Stopped (02/25/21 0444)    Consultants:see note  Procedures:see note  Antimicrobials: Anti-infectives (From admission, onward)    Start     Dose/Rate Route Frequency Ordered Stop   02/27/21 2000  Ampicillin-Sulbactam (UNASYN) 3 g in sodium chloride 0.9 % 100 mL IVPB        3 g 200 mL/hr over 30 Minutes Intravenous Every 6 hours 02/27/21 1313     02/25/21 2200  vancomycin (VANCOREADY) IVPB 750 mg/150 mL  Status:  Discontinued        750 mg 150 mL/hr over 60 Minutes Intravenous Every 12 hours 02/25/21 1349 02/27/21 1313   02/24/21 1000  vancomycin (VANCOREADY) IVPB 1000 mg/200 mL  Status:  Discontinued        1,000 mg 200 mL/hr over 60 Minutes Intravenous Every 12 hours 02/23/21 2215 02/25/21 1349   02/23/21 2215  clindamycin (CLEOCIN) IVPB 600 mg        600 mg 100 mL/hr over 30 Minutes Intravenous Every 8 hours 02/23/21 2122 02/26/21 2209   02/23/21 2200  vancomycin (VANCOREADY) IVPB 2000 mg/400 mL        2,000 mg 200 mL/hr over 120 Minutes Intravenous  Once 02/23/21 2133 02/23/21 2353   02/23/21 2200  piperacillin-tazobactam (ZOSYN) IVPB 3.375 g   Status:  Discontinued        3.375 g 12.5 mL/hr over 4 Hours Intravenous Every 8 hours 02/23/21 2133 02/27/21 1313      Culture/Microbiology    Component Value Date/Time   SDES  02/24/2021 9528    ABSCESS PERINEUM Performed at [facility], [address] [place], [place] [address]    SPECREQUEST  02/24/2021 4010    NONE Performed at [facility], [address], [place], [place] [address]    CULT  02/24/2021 [phone]    FEW STREPTOCOCCUS ANGINOSIS NO ANAEROBES ISOLATED; CULTURE IN PROGRESS FOR 5 DAYS    REPTSTATUS  PENDING 02/24/2021 0903    Other culture-see note  Objective: Vitals: Today's Vitals   02/28/21 2200 03/01/21 0010 03/01/21 0041 03/01/21 0443  BP:    (!) 169/71  Pulse:    79  Resp:    18  Temp:    98.2 F (36.8 C)  TempSrc:      SpO2:    96%  Weight:      Height:      PainSc: 0-No pain 8  4      Intake/Output Summary (Last 24 hours) at 03/01/2021 0818 Last data filed at 03/01/2021 0519 Gross per 24 hour  Intake 1521 ml  Output 2250 ml  Net -729 ml    Filed Weights   02/22/21 1509 02/23/21 0400 02/24/21 0751  Weight: 101.1 kg 101.7 kg 101.7 kg   Weight change:   Intake/Output from previous day: 07/16 0701 - 07/17 0700 In: 1521 [P.O.:1421; IV Piggyback:100] Out: 2250 [Urine:1600; Stool:650] Intake/Output this shift: No intake/output data recorded. Filed Weights   02/22/21 1509 02/23/21 0400 02/24/21 0751  Weight: 101.1 kg 101.7 kg 101.7 kg    Examination: General exam: AAOx 3, obese, on RA, pleasant  HEENT:Oral mucosa moist, Ear/Nose WNL grossly, dentition normal. Respiratory system: bilaterally diminished,no use of accessory muscle Cardiovascular system: S1 & S2 +, No JVD,. Gastrointestinal system: Abdomen soft, surgical site with dressing intact colostomy still present NT,ND, BS+ Nervous System:Alert, awake, moving extremities and grossly nonfocal Extremities: no edema, distal peripheral pulses  palpable.  Skin: No rashes,no icterus. MSK: Normal muscle bulk,tone, power   Data Reviewed: I have personally reviewed following labs and imaging studies CBC: Recent Labs  Lab 02/22/21 1118 02/22/21 1155 02/24/21 0636 02/25/21 0300 02/26/21 0249 02/27/21 0245 02/28/21 0531 03/01/21 0556  WBC 13.2*   < > 12.2* 10.5 10.8* 10.2 12.5* 10.4  NEUTROABS 11.4*  --  10.0* 8.6*  --   --   --   --   HGB 13.2   < > 11.8* 10.8* 10.1* 9.6* 10.5* 9.6*  HCT 40.5   < > 37.7* 33.6* 31.3* 30.8* 33.1* 29.4*  MCV 89.0   < > 92.6 91.1 91.8 92.5 91.7 90.2  PLT 286   < > 256 222 252 254 291 307   < > = values in this interval not displayed.    Basic Metabolic Panel: Recent Labs  Lab 02/25/21 0300 02/26/21 0249 02/27/21 0245 02/28/21 0531 03/01/21 0556  NA 140 139 136 141 141  K 3.3* 3.4* 2.9* 3.5 3.1*  CL 107 107 104 106 105  CO2 23 21* 23 26 26   GLUCOSE 190* 184* 127* 161* 140*  BUN 24* 25* 22* 17 12  CREATININE 1.26* 1.24 1.22 1.05 0.92  CALCIUM 7.2* 7.1* 6.8* 7.5* 7.5*    GFR: Estimated Creatinine Clearance: 110.2 mL/min (by C-G formula based on SCr of 0.92 mg/dL). Liver Function Tests: Recent Labs  Lab 02/22/21 1118  AST 10*  ALT 10  ALKPHOS 80  BILITOT 1.4*  PROT 7.7  ALBUMIN 3.3*    No results for input(s): LIPASE, AMYLASE in the last 168 hours. No results for input(s): AMMONIA in the last 168 hours. Coagulation Profile: No results for input(s): INR, PROTIME in the last 168 hours. Cardiac Enzymes: No results for input(s): CKTOTAL, CKMB, CKMBINDEX, TROPONINI in the last 168 hours. BNP (last 3 results) No results for input(s): PROBNP in the last 8760 hours. HbA1C: No results for input(s): HGBA1C in the last 72 hours.  CBG: Recent Labs  Lab 02/28/21  1514 02/28/21 2015 03/01/21 0005 03/01/21 0439 03/01/21 0741  GLUCAP 237* 209* 146* 138* 131*    Lipid Profile: No results for input(s): CHOL, HDL, LDLCALC, TRIG, CHOLHDL, LDLDIRECT in the last 72 hours. Thyroid  Function Tests: No results for input(s): TSH, T4TOTAL, FREET4, T3FREE, THYROIDAB in the last 72 hours. Anemia Panel: No results for input(s): VITAMINB12, FOLATE, FERRITIN, TIBC, IRON, RETICCTPCT in the last 72 hours.  Sepsis Labs: No results for input(s): PROCALCITON, LATICACIDVEN in the last 168 hours.  Recent Results (from the past 240 hour(s))  Resp Panel by RT-PCR (Flu A&B, Covid) Nasopharyngeal Swab     Status: None   Collection Time: 02/22/21  1:25 PM   Specimen: Nasopharyngeal Swab; Nasopharyngeal(NP) swabs in vial transport medium  Result Value Ref Range Status   SARS Coronavirus 2 by RT PCR NEGATIVE NEGATIVE Final    Comment: (NOTE) SARS-CoV-2 target nucleic acids are NOT DETECTED.  The SARS-CoV-2 RNA is generally detectable in upper respiratory specimens during the acute phase of infection. The lowest concentration of SARS-CoV-2 viral copies this assay can detect is 138 copies/mL. A negative result does not preclude SARS-Cov-2 infection and should not be used as the sole basis for treatment or other patient management decisions. A negative result may occur with  improper specimen collection/handling, submission of specimen other than nasopharyngeal swab, presence of viral mutation(s) within the areas targeted by this assay, and inadequate number of viral copies(<138 copies/mL). A negative result must be combined with clinical observations, patient history, and epidemiological information. The expected result is Negative.  Fact Sheet for Patients:  BloggerCourse.comhttps://www.fda.gov/media/152166/download  Fact Sheet for Healthcare Providers:  SeriousBroker.ithttps://www.fda.gov/media/152162/download  This test is no t yet approved or cleared by the [place] FDA and  has been authorized for detection and/or diagnosis of SARS-CoV-2 by FDA under an Emergency Use Authorization (EUA). This EUA will remain  in effect (meaning this test can be used) for the duration of the COVID-19 declaration under  Section 564(b)(1) of the Act, 21 U.S.C.section 360bbb-3(b)(1), unless the authorization is terminated  or revoked sooner.       Influenza A by PCR NEGATIVE NEGATIVE Final   Influenza B by PCR NEGATIVE NEGATIVE Final    Comment: (NOTE) The Xpert Xpress SARS-CoV-2/FLU/RSV plus assay is intended as an aid in the diagnosis of influenza from Nasopharyngeal swab specimens and should not be used as a sole basis for treatment. Nasal washings and aspirates are unacceptable for Xpert Xpress SARS-CoV-2/FLU/RSV testing.  Fact Sheet for Patients: BloggerCourse.comhttps://www.fda.gov/media/152166/download  Fact Sheet for Healthcare Providers: SeriousBroker.ithttps://www.fda.gov/media/152162/download  This test is not yet approved or cleared by the [place] FDA and has been authorized for detection and/or diagnosis of SARS-CoV-2 by FDA under an Emergency Use Authorization (EUA). This EUA will remain in effect (meaning this test can be used) for the duration of the COVID-19 declaration under Section 564(b)(1) of the Act, 21 U.S.C. section 360bbb-3(b)(1), unless the authorization is terminated or revoked.  Performed at [facility], [address] [place], [place] [id number]   MRSA Next Gen by PCR, Nasal     Status: None   Collection Time: 02/22/21  3:10 PM   Specimen: Nasal Mucosa; Nasal Swab  Result Value Ref Range Status   MRSA by PCR Next Gen NOT DETECTED NOT DETECTED Final    Comment: (NOTE) The GeneXpert MRSA Assay (FDA approved for NASAL specimens only), is one component of a comprehensive MRSA colonization surveillance program. It is not intended to diagnose MRSA infection nor to  guide or monitor treatment for MRSA infections. Test performance is not FDA approved in patients less than 70 years old. Performed at [facility], [address] [place], [place] [address]   Culture, blood (routine x 2)     Status: None (Preliminary result)   Collection Time: 02/23/21 10:09  PM   Specimen: BLOOD  Result Value Ref Range Status   Specimen Description   Final    BLOOD BLOOD LEFT HAND Performed at [facility], [address], [place], [place] [address]    Special Requests   Final    BOTTLES DRAWN AEROBIC ONLY Blood Culture adequate volume Performed at [facility], [address] [place], [place] [address]    Culture   Final    NO GROWTH 4 DAYS Performed at [facility], [address] [place], [place] [address]    Report Status PENDING  Incomplete  Culture, blood (routine x 2)     Status: None (Preliminary result)   Collection Time: 02/24/21  2:59 AM   Specimen: BLOOD  Result Value Ref Range Status   Specimen Description   Final    BLOOD BLOOD RIGHT HAND Performed at [facility], [address] [place], [place] [address]    Special Requests   Final    BOTTLES DRAWN AEROBIC ONLY Blood Culture adequate volume Performed at [facility], [address] [place], [place] [address]    Culture   Final    NO GROWTH 4 DAYS Performed at [facility], [address] [place], [place] [address]    Report Status PENDING  Incomplete  Fungus Culture With Stain     Status: None (Preliminary result)   Collection Time: 02/24/21  9:03 AM   Specimen: PATH Cytology Peritoneal fluid; Body Fluid  Result Value Ref Range Status   Fungus Stain Final report  Final    Comment: (NOTE) Performed At: [facility] [address] [place], [place] [id number] Jolene Schimke MD UT:[id number]    Fungus (Mycology) Culture PENDING  Incomplete   Fungal Source ABSCESS  Final    Comment: PERINEUM Performed at [facility], [address], [place], [place] [address]   Aerobic/Anaerobic Culture w Gram Stain (surgical/deep wound)     Status: None (Preliminary result)   Collection Time: 02/24/21  9:03 AM   Specimen: PATH Cytology Peritoneal fluid; Body Fluid  Result  Value Ref Range Status   Specimen Description   Final    ABSCESS PERINEUM Performed at [facility], [address], [place], [place] [address]    Special Requests   Final    NONE Performed at [facility], [address] [place], [place] [address]    Gram Stain   Final    RARE WBC PRESENT,BOTH PMN AND MONONUCLEAR ABUNDANT GRAM POSITIVE RODS FEW GRAM POSITIVE COCCI IN PAIRS MODERATE GRAM NEGATIVE COCCOBACILLI Performed at [facility], [address] [place], [place] [address]    Culture   Final    FEW STREPTOCOCCUS ANGINOSIS NO ANAEROBES ISOLATED; CULTURE IN PROGRESS FOR 5 DAYS    Report Status PENDING  Incomplete   Organism ID, Bacteria STREPTOCOCCUS ANGINOSIS  Final      Susceptibility   Streptococcus anginosis - MIC*    PENICILLIN <=0.06 SENSITIVE Sensitive     CEFTRIAXONE 0.5 SENSITIVE Sensitive     ERYTHROMYCIN <=0.12 SENSITIVE Sensitive     LEVOFLOXACIN 1 SENSITIVE  Sensitive     VANCOMYCIN 0.5 SENSITIVE Sensitive     * FEW STREPTOCOCCUS ANGINOSIS  Acid Fast Smear (AFB)     Status: None   Collection Time: 02/24/21  9:03 AM   Specimen: PATH Cytology Peritoneal fluid; Body Fluid  Result Value Ref Range Status   AFB Specimen Processing Concentration  Final   Acid Fast Smear Negative  Final    Comment: (NOTE) Performed At: [facility] [address] [place], [place] [id number] Jolene Schimke MD JX:[id number]    Source (AFB) ABSCESS  Final    Comment: PERINEUM Performed at [facility], [address], [place], [place] [address]   Fungus Culture Result     Status: None   Collection Time: 02/24/21  9:03 AM  Result Value Ref Range Status   Result 1 Comment  Final    Comment: (NOTE) KOH/Calcofluor preparation:  no fungus observed. Performed At: [facility] [address] [place], [place] [id number] Jolene Schimke MD BM:[id number]       Radiology Studies: No results found.    LOS: 7 days   Lanae Boast, MD [facility]  03/01/2021, 8:18 AM

## 2021-03-02 LAB — GLUCOSE, CAPILLARY
Glucose-Capillary: 149 mg/dL — ABNORMAL HIGH (ref 70–99)
Glucose-Capillary: 210 mg/dL — ABNORMAL HIGH (ref 70–99)
Glucose-Capillary: 139 mg/dL — ABNORMAL HIGH (ref 70–99)
Glucose-Capillary: 166 mg/dL — ABNORMAL HIGH (ref 70–99)
Glucose-Capillary: 157 mg/dL — ABNORMAL HIGH (ref 70–99)
Glucose-Capillary: 206 mg/dL — ABNORMAL HIGH (ref 70–99)

## 2021-03-02 LAB — BASIC METABOLIC PANEL
Anion gap: 11 (ref 5–15)
BUN: 14 mg/dL (ref 6–20)
CO2: 27 mmol/L (ref 22–32)
Calcium: 7.9 mg/dL — ABNORMAL LOW (ref 8.9–10.3)
Chloride: 103 mmol/L (ref 98–111)
Creatinine, Ser: 1.08 mg/dL (ref 0.61–1.24)
GFR, Estimated: >60 mL/min (ref >60)
Glucose, Bld: 151 mg/dL — ABNORMAL HIGH (ref 70–99)
Potassium: 3.5 mmol/L (ref 3.5–5.1)
Sodium: 141 mmol/L (ref 135–145)

## 2021-03-02 LAB — CBC
HCT: 30.8 % — ABNORMAL LOW (ref 39.0–52.0)
Hemoglobin: 10 g/dL — ABNORMAL LOW (ref 13.0–17.0)
MCH: 29 pg (ref 26.0–34.0)
MCHC: 32.5 g/dL (ref 30.0–36.0)
MCV: 89.3 fL (ref 80.0–100.0)
Platelets: 353 10*3/uL (ref 150–400)
RBC: 3.45 MIL/uL — ABNORMAL LOW (ref 4.22–5.81)
RDW: 14.9 % (ref 11.5–15.5)
WBC: 9.8 10*3/uL (ref 4.0–10.5)
nRBC: 0 % (ref 0.0–0.2)

## 2021-03-02 NOTE — Consult Note (Addendum)
WOC Nurse ostomy follow up Stoma type/location: LLQ loop colostomy with plastic bridge sutured to skin. Pouch leakage has occurred at 3 o'clock and belt is stained with dried stool Stomal assessment/size:  Oval in a crease, 1 and 3/8 x 2 inches.  Red in center, sloughing at periphery Peristomal assessment: Intact Treatment options for stomal/peristomal skin:  skin barrier ring around stoma.  1/2 skin barrier ring over each side of plastic bridge. Hydrocolloid half sheet over each side of plastic bridge with barrier ring to promote a flat pouching surface. Output: Light brown effluent, loose Ostomy pouching: 1pc., 2 and 1/8 inch easily compressible convex pouch with viewing window removed and with aforementioned modifications to pouching surface. A belt is attached. Education provided:  Patient reports that he and his friend, Onalee Hua, had a session with my associate, M. Austin and that he feels good about his ability to perform. I am hopeful this is true, but given the complexity of the pouching procedure and the limited success even ostomy nurses are having in maintaining a seal, have my doubts. Patient states that pouch has been changed several times since it was applied since Friday.  I tell him that the date on the pouch is Friday, 7/15, but he remains adamant.  I ask him if perhaps the pouch has been emptied and again, he is adamant that it was "changed". Pouching procedure will continue to be complicated by the presence of the stoma rod and even after removal, it will be challenging given its location in a deep crease.  Patient is able to perform Lock and Roll closure with assistance and cueing today. Patient states that he does not like the belt; he is reminded that the belt is an essential piece of his pouching system and that without it, there is likely to be leakage at the 3 and 9 o'clock positions. Substantial teaching is still needed regarding techniques required for predictable and repeatable  wear time with this complex ostomy. Enrolled patient in [place] [employer] Start Discharge program: Yes, previously.  3 pouches (1-piece, easily compressible, convex pouch 2 and 1/8 inches) with 2 hydrocolloid sheet dressings and 4 skin barrier rings in room. A new large ostomy appliance belt is provided as well as a bottle of stoma powder.  WOC nursing team will continue to follow, and will remain available to this patient, the nursing and medical teams.  Thanks, Ladona Mow, MSN, RN, GNP, [place]  Pager# [phone]

## 2021-03-02 NOTE — Progress Notes (Signed)
Physical Therapy Treatment Patient Details Name: Andres Richardson MRN: [id number] DOB: 02-Oct-1965 Today's Date: 03/02/2021    History of Present Illness 55 year old male admitted with intractable nausea vomiting, DKA managed with insulin drip on 7/10.  Patient however demonstrated ongoing fever and vomiting CT abdomen showed perirectal abscess for fournier gangrene surgery, urology was consulted.  Patient is status post debridement by general surgery and urology 7/12- "laparoscopic creation of loop sigmoid colostomy, laparoscopic lysis of adhesions, perineal exam under anesthesia"    PT Comments    Pt assisted with ambulating in hallway.  Pt reports increased pain with getting into/out of bed due to perineal wounds.  Pt able to mobilize however may need assist at home for bathing and dressing changes.    Follow Up Recommendations  Home health PT     Equipment Recommendations  None recommended by PT    Recommendations for Other Services       Precautions / Restrictions Precautions Precautions: Other (comment) Precaution Comments: L side colostomy, perineal wounds    Mobility  Bed Mobility Overal bed mobility: Needs Assistance Bed Mobility: Rolling;Sidelying to Sit;Sit to Sidelying Rolling: Min guard Sidelying to sit: Min guard     Sit to sidelying: Min guard General bed mobility comments: min/guard for safety, cues for log roll technique, pt reliant on UE assist due to pain    Transfers Overall transfer level: Needs assistance Equipment used: Rolling walker (2 wheeled) Transfers: Sit to/from Stand Sit to Stand: Min guard         General transfer comment: B UE assisting, slow to rise for comfort  Ambulation/Gait Ambulation/Gait assistance: Min guard Gait Distance (Feet): 120 Feet Assistive device: Rolling walker (2 wheeled) Gait Pattern/deviations: Decreased stride length;Step-through pattern;Wide base of support Gait velocity: decreased   General Gait Details:  initial wide short steps however pt improved BOS with increased distance, SpO2 88% on room air upon returning to bed   Stairs             Wheelchair Mobility    Modified Rankin (Stroke Patients Only)       Balance                                            Cognition Arousal/Alertness: Awake/alert Behavior During Therapy: WFL for tasks assessed/performed Overall Cognitive Status: Within Functional Limits for tasks assessed                                        Exercises      General Comments        Pertinent Vitals/Pain Pain Assessment: 0-10 Pain Score: 6  Pain Location: abd/perineal especially with transitional movement Pain Descriptors / Indicators: Aching;Sore Pain Intervention(s): Repositioned;Monitored during session;Premedicated before session    Home Living                      Prior Function            PT Goals (current goals can now be found in the care plan section) Progress towards PT goals: Progressing toward goals    Frequency    Min 3X/week      PT Plan Current plan remains appropriate    Co-evaluation  AM-PAC PT "6 Clicks" Mobility   Outcome Measure  Help needed turning from your back to your side while in a flat bed without using bedrails?: A Little Help needed moving from lying on your back to sitting on the side of a flat bed without using bedrails?: A Little Help needed moving to and from a bed to a chair (including a wheelchair)?: A Little Help needed standing up from a chair using your arms (e.g., wheelchair or bedside chair)?: A Little Help needed to walk in hospital room?: A Little Help needed climbing 3-5 steps with a railing? : A Lot 6 Click Score: 17    End of Session Equipment Utilized During Treatment: Gait belt Activity Tolerance: Patient tolerated treatment well Patient left: in bed;with call bell/phone within reach;with family/visitor present Nurse  Communication: Mobility status PT Visit Diagnosis: Other abnormalities of gait and mobility (R26.89)     Time: 7564-3329 PT Time Calculation (min) (ACUTE ONLY): 20 min  Charges:  $Gait Training: 8-22 mins                     Paulino Door, DPT Acute Rehabilitation Services Pager: [phone] Office: [phone]    Maida Sale E 03/02/2021, 3:51 PM

## 2021-03-02 NOTE — Progress Notes (Addendum)
5 Days Post-Op  Subjective: CC: Tolerating CM diet without abdominal pain, n/v. Having colostomy output. Has not performed bag emptying/changing. Lives at home with roommate. Roommate watched pouch change with WOCN on 7/15. He is working with therapies who recommend HHPT. Voiding.   Objective: Vital signs in last 24 hours: Temp:  [98.1 F (36.7 C)-99.3 F (37.4 C)] 98.2 F (36.8 C) (07/18 0348) Pulse Rate:  [79-84] 79 (07/18 0348) Resp:  [20] 20 (07/17 1314) BP: (151-173)/(71-77) 163/74 (07/18 0348) SpO2:  [97 %] 97 % (07/18 0348) Last BM Date: 03/01/21  Intake/Output from previous day: 07/17 0701 - 07/18 0700 In: 720 [P.O.:720] Out: 1950 [Urine:1650; Stool:300] Intake/Output this shift: No intake/output data recorded.  PE: Gen:  Alert, NAD, pleasant Pulm: Normal rate and effort  Abd: Soft, ND, NT +BS, laparoscopic sites with staples in place, c/d/I. Ostomy bag with stool. Stoma unable to be visualized 2/2 stool Psych: A&Ox3  Skin: no rashes noted, warm and dry GU: Chaperone present. There is some purulent drainage on packing. Wound as noted below with mixed granulation tissue and fibropurulent tissue    Lab Results:  Recent Labs    03/01/21 0556 03/02/21 0500  WBC 10.4 9.8  HGB 9.6* 10.0*  HCT 29.4* 30.8*  PLT 307 353   BMET Recent Labs    03/01/21 0556 03/02/21 0500  NA 141 141  K 3.1* 3.5  CL 105 103  CO2 26 27  GLUCOSE 140* 151*  BUN 12 14  CREATININE 0.92 1.08  CALCIUM 7.5* 7.9*   PT/INR No results for input(s): LABPROT, INR in the last 72 hours. CMP     Component Value Date/Time   NA 141 03/02/2021 0500   K 3.5 03/02/2021 0500   CL 103 03/02/2021 0500   CO2 27 03/02/2021 0500   GLUCOSE 151 (H) 03/02/2021 0500   BUN 14 03/02/2021 0500   CREATININE 1.08 03/02/2021 0500   CALCIUM 7.9 (L) 03/02/2021 0500   PROT 7.7 02/22/2021 1118   ALBUMIN 3.3 (L) 02/22/2021 1118   AST 10 (L) 02/22/2021 1118   ALT 10 02/22/2021 1118   ALKPHOS 80  02/22/2021 1118   BILITOT 1.4 (H) 02/22/2021 1118   GFRNONAA >60 03/02/2021 0500   GFRAA >60 05/14/2019 0317   Lipase     Component Value Date/Time   LIPASE 193 (H) 05/10/2019 1946       Studies/Results: No results found.  Anti-infectives: Anti-infectives (From admission, onward)    Start     Dose/Rate Route Frequency Ordered Stop   02/27/21 2000  Ampicillin-Sulbactam (UNASYN) 3 g in sodium chloride 0.9 % 100 mL IVPB        3 g 200 mL/hr over 30 Minutes Intravenous Every 6 hours 02/27/21 1313     02/25/21 2200  vancomycin (VANCOREADY) IVPB 750 mg/150 mL  Status:  Discontinued        750 mg 150 mL/hr over 60 Minutes Intravenous Every 12 hours 02/25/21 1349 02/27/21 1313   02/24/21 1000  vancomycin (VANCOREADY) IVPB 1000 mg/200 mL  Status:  Discontinued        1,000 mg 200 mL/hr over 60 Minutes Intravenous Every 12 hours 02/23/21 2215 02/25/21 1349   02/23/21 2215  clindamycin (CLEOCIN) IVPB 600 mg        600 mg 100 mL/hr over 30 Minutes Intravenous Every 8 hours 02/23/21 2122 02/26/21 2209   02/23/21 2200  vancomycin (VANCOREADY) IVPB 2000 mg/400 mL        2,000 mg  200 mL/hr over 120 Minutes Intravenous  Once 02/23/21 2133 02/23/21 2353   02/23/21 2200  piperacillin-tazobactam (ZOSYN) IVPB 3.375 g  Status:  Discontinued        3.375 g 12.5 mL/hr over 4 Hours Intravenous Every 8 hours 02/23/21 2133 02/27/21 1313        Assessment/Plan Fournier's gangrene POD6 I&D perineum 15 x 10 x 8 cm, containing skin, subcutaneous tissue, fascia - Dr. Sheliah Hatch 7/12 POD5 laparoscopic loop sigmoid colostomy with LOA - Dr. Sheliah Hatch - 7/13 - Cx w/ strep anginosis. Cont abx - Tolerating diet and having ostomy output - WOC following - PT/OT, mobilize. Recommended for [facility] - wound care: Increased to TID WTD dressing changes. Consider hydro if not cleaning up in next 24-48 hours.    FEN: CM, IVF per [facility] VTE: SCDs, LMWH  ID: Unasyn Foley: Voiding    - below per primary - DKA/T2DM,  poorly controlled HLD HTN PAD with claudication Tobacco abuse Obesity - BMI 31.27   LOS: 8 days    Jacinto Halim , [facility] 03/02/2021, 8:26 AM Please see Amion for pager number during day hours 7:00am-4:30pm

## 2021-03-02 NOTE — Progress Notes (Signed)
PROGRESS NOTE    Andres Richardson  EHM:[id number] DOB: Dec 14, 1965 DOA: 02/22/2021 PCP: [name], MD   No chief complaint on file.  Brief Narrative: 55 year old male with diabetes to with diabetes admitted with intractable nausea vomiting, DKA managed with insulin drip on 7/10.  Patient however demonstrated ongoing fever and vomiting CT abdomen showed perirectal abscess for fournier gangrene surgery, urology was consulted. Patient is status post debridement by general surgery and urology 7/12- "laparoscopic creation of loop sigmoid colostomy, laparoscopic lysis of adhesions, perineal exam under anesthesia" Patient colostomy started have some bowel activity 7/15.  Diet was advanced.  Antibiotic changed to Unasyn as were culture growing strep. Patient transferred to MedSurg 7/15 Clinically improving having colostomy output tolerating regular diet  Subjective: No new complaints.  Tolerating regular diet.  Waiting for wound dressing change by surgery this morning  Assessment & Plan:  Fournier's gangrene: CCS and urology following- s/p debridement 7/12 and loop sigmoid colostomy, LOS by gen surgery 7/13.  WBC fluctuating but stable this morning.  Afebrile.   OR culture 7/12- stain w/ abundant GPR, few GPC GNCB- culture w/ strep anginosis, no anerobes-after discussion with surgery antibiotic adjusted to Unasyn 7/15.  Overall clinically improving ostomy output 650 mL tolerating current modified diet. He completed Clindamycinx 72 hr. dressing change at bedside this morning wound care: Increased to 3 times daily WTD daily dressing changes, consider Hydro if not cleaning up in next 24 to 48 hours.    Hypokalemia: Resolved   DKA-resolved Type 2 diabetes mellitus-poorly controlled-at home -on glipizide metformin  and insulin Blood sugar well controlled currently, poorly controlled, w/ hba1c 9.5.  Continue on Levemir 12 u bid, and NovoLog 3 units  TID AC, SSI  Recent Labs  Lab 03/01/21 1607  03/01/21 1952 03/02/21 0059 03/02/21 0342 03/02/21 0715  GLUCAP 237* 237* 210* 149* 139*   ZMO:[id number]  Hypernatremia resolved Hypertension:controlled,not on meds at home.  Stable Tobacco abuse continue nicotine patch.  Cessation advise.  Patient  Morbid obesity BMI  31-He will benefit with weight loss healthy lifestyle.  Diet Order             Diet Carb Modified Fluid consistency: Thin; Room service appropriate? Yes  Diet effective now                   Patient's Body mass index is 31.27 kg/m. DVT prophylaxis: enoxaparin (LOVENOX) injection 40 mg Start: 02/26/21 1000SCD. Lovenox once okay w Surgery Code Status:   Code Status: DNR  Family Communication: plan of care discussed with patient at bedside.  Status is: Inpatient Remains inpatient appropriate because:IV treatments appropriate due to intensity of illness or inability to take PO and Inpatient level of care appropriate due to severity of illness Dispo: The patient is from: Home              Anticipated d/c is to: HHPT.Once okAy w/ surgery.              Patient currently is not medically stable to d/c.   Difficult to place patient No Unresulted Labs (From admission, onward)     Start     Ordered   03/01/21 0500  Basic metabolic panel  Daily,   R     Question:  Specimen collection method  Answer:  Lab=Lab collect   02/28/21 0545   03/01/21 0500  CBC  Daily,   R     Question:  Specimen collection method  Answer:  Lab=Lab collect  02/28/21 0919   02/24/21 0909  Acid Fast Culture with reflexed sensitivities  RELEASE UPON ORDERING,   TIMED       Comments: Specimen A: Pre-op diagnosis: fournier gangrene    02/24/21 0909            Medications reviewed:  Scheduled Meds:  (feeding supplement) PROSource Plus  30 mL Oral BID BM   acetaminophen  1,000 mg Oral Q6H   vitamin C  500 mg Oral BID   Chlorhexidine Gluconate Cloth  6 each Topical Q0600   cyanocobalamin  1,000 mcg Intramuscular Daily   enoxaparin  (LOVENOX) injection  40 mg Subcutaneous Q24H   feeding supplement  1 Container Oral BID BM   insulin aspart  0-15 Units Subcutaneous Q4H   insulin aspart  3 Units Subcutaneous TID WC   insulin detemir  12 Units Subcutaneous BID   mouth rinse  15 mL Mouth Rinse BID   methocarbamol  500 mg Oral TID   multivitamin with minerals  1 tablet Oral Daily   nicotine  21 mg Transdermal Daily   nutrition supplement (JUVEN)  1 packet Oral BID BM   pantoprazole  40 mg Oral Daily   potassium chloride  20 mEq Oral Daily   zinc sulfate  220 mg Oral Daily   Continuous Infusions:  ampicillin-sulbactam (UNASYN) IV 3 g (03/02/21 0809)   promethazine (PHENERGAN) injection (IM or IVPB) Stopped (02/25/21 0444)    Consultants:see note  Procedures:see note  Antimicrobials: Anti-infectives (From admission, onward)    Start     Dose/Rate Route Frequency Ordered Stop   02/27/21 2000  Ampicillin-Sulbactam (UNASYN) 3 g in sodium chloride 0.9 % 100 mL IVPB        3 g 200 mL/hr over 30 Minutes Intravenous Every 6 hours 02/27/21 1313     02/25/21 2200  vancomycin (VANCOREADY) IVPB 750 mg/150 mL  Status:  Discontinued        750 mg 150 mL/hr over 60 Minutes Intravenous Every 12 hours 02/25/21 1349 02/27/21 1313   02/24/21 1000  vancomycin (VANCOREADY) IVPB 1000 mg/200 mL  Status:  Discontinued        1,000 mg 200 mL/hr over 60 Minutes Intravenous Every 12 hours 02/23/21 2215 02/25/21 1349   02/23/21 2215  clindamycin (CLEOCIN) IVPB 600 mg        600 mg 100 mL/hr over 30 Minutes Intravenous Every 8 hours 02/23/21 2122 02/26/21 2209   02/23/21 2200  vancomycin (VANCOREADY) IVPB 2000 mg/400 mL        2,000 mg 200 mL/hr over 120 Minutes Intravenous  Once 02/23/21 2133 02/23/21 2353   02/23/21 2200  piperacillin-tazobactam (ZOSYN) IVPB 3.375 g  Status:  Discontinued        3.375 g 12.5 mL/hr over 4 Hours Intravenous Every 8 hours 02/23/21 2133 02/27/21 1313      Culture/Microbiology    Component Value  Date/Time   SDES  02/24/2021 2992    ABSCESS PERINEUM Performed at [facility], [address], [place], [place] [address]    SPECREQUEST  02/24/2021 4196    NONE Performed at [facility], [address] [place], [place] [address]    CULT  02/24/2021 [id number]    FEW STREPTOCOCCUS ANGINOSIS HOLDING FOR POSSIBLE ANAEROBE Performed at [facility], [address] [place], [place] [address]    REPTSTATUS PENDING 02/24/2021 7408    Other culture-see note  Objective: Vitals: Today's Vitals   03/02/21 0315 03/02/21 0348  03/02/21 0800 03/02/21 0916  BP:  (!) 163/74    Pulse:  79    Resp:      Temp:  98.2 F (36.8 C)    TempSrc:  Oral    SpO2:  97%    Weight:      Height:      PainSc: 5   8  4      Intake/Output Summary (Last 24 hours) at 03/02/2021 1112 Last data filed at 03/02/2021 0422 Gross per 24 hour  Intake 480 ml  Output 1650 ml  Net -1170 ml    Filed Weights   02/22/21 1509 02/23/21 0400 02/24/21 0751  Weight: 101.1 kg 101.7 kg 101.7 kg   Weight change:   Intake/Output from previous day: 07/17 0701 - 07/18 0700 In: 720 [P.O.:720] Out: 1950 [Urine:1650; Stool:300] Intake/Output this shift: No intake/output data recorded. Filed Weights   02/22/21 1509 02/23/21 0400 02/24/21 0751  Weight: 101.1 kg 101.7 kg 101.7 kg    Examination: General exam: AAOx 3 older than stated age, weak appearing. HEENT:Oral mucosa moist, Ear/Nose WNL grossly, dentition normal. Respiratory system: bilaterally clear and breath sounds, no use of accessory muscle Cardiovascular system: S1 & S2 +, No JVD,. Gastrointestinal system: Abdomen soft, perineal wound see pictures below colostomy bag with stool in place Nervous System:Alert, awake, moving extremities and grossly nonfocal Extremities: NO edema, distal peripheral pulses palpable.  Skin: No rashes,no icterus. MSK: Normal muscle bulk,tone, power    Data Reviewed: I have personally  reviewed following labs and imaging studies CBC: Recent Labs  Lab 02/24/21 0636 02/25/21 0300 02/26/21 0249 02/27/21 0245 02/28/21 0531 03/01/21 0556 03/02/21 0500  WBC 12.2* 10.5 10.8* 10.2 12.5* 10.4 9.8  NEUTROABS 10.0* 8.6*  --   --   --   --   --   HGB 11.8* 10.8* 10.1* 9.6* 10.5* 9.6* 10.0*  HCT 37.7* 33.6* 31.3* 30.8* 33.1* 29.4* 30.8*  MCV 92.6 91.1 91.8 92.5 91.7 90.2 89.3  PLT 256 222 252 254 291 307 353    Basic Metabolic Panel: Recent Labs  Lab 02/26/21 0249 02/27/21 0245 02/28/21 0531 03/01/21 0556 03/02/21 0500  NA 139 136 141 141 141  K 3.4* 2.9* 3.5 3.1* 3.5  CL 107 104 106 105 103  CO2 21* 23 26 26 27   GLUCOSE 184* 127* 161* 140* 151*  BUN 25* 22* 17 12 14   CREATININE 1.24 1.22 1.05 0.92 1.08  CALCIUM 7.1* 6.8* 7.5* 7.5* 7.9*    GFR: Estimated Creatinine Clearance: 93.9 mL/min (by C-G formula based on SCr of 1.08 mg/dL). Liver Function Tests: No results for input(s): AST, ALT, ALKPHOS, BILITOT, PROT, ALBUMIN in the last 168 hours.  No results for input(s): LIPASE, AMYLASE in the last 168 hours. No results for input(s): AMMONIA in the last 168 hours. Coagulation Profile: No results for input(s): INR, PROTIME in the last 168 hours. Cardiac Enzymes: No results for input(s): CKTOTAL, CKMB, CKMBINDEX, TROPONINI in the last 168 hours. BNP (last 3 results) No results for input(s): PROBNP in the last 8760 hours. HbA1C: No results for input(s): HGBA1C in the last 72 hours.  CBG: Recent Labs  Lab 03/01/21 1607 03/01/21 1952 03/02/21 0059 03/02/21 0342 03/02/21 0715  GLUCAP 237* 237* 210* 149* 139*    Lipid Profile: No results for input(s): CHOL, HDL, LDLCALC, TRIG, CHOLHDL, LDLDIRECT in the last 72 hours. Thyroid Function Tests: No results for input(s): TSH, T4TOTAL, FREET4, T3FREE, THYROIDAB in the last 72 hours. Anemia Panel: No results for input(s): VITAMINB12,  FOLATE, FERRITIN, TIBC, IRON, RETICCTPCT in the last 72 hours.  Sepsis  Labs: No results for input(s): PROCALCITON, LATICACIDVEN in the last 168 hours.  Recent Results (from the past 240 hour(s))  Resp Panel by RT-PCR (Flu A&B, Covid) Nasopharyngeal Swab     Status: None   Collection Time: 02/22/21  1:25 PM   Specimen: Nasopharyngeal Swab; Nasopharyngeal(NP) swabs in vial transport medium  Result Value Ref Range Status   SARS Coronavirus 2 by RT PCR NEGATIVE NEGATIVE Final    Comment: (NOTE) SARS-CoV-2 target nucleic acids are NOT DETECTED.  The SARS-CoV-2 RNA is generally detectable in upper respiratory specimens during the acute phase of infection. The lowest concentration of SARS-CoV-2 viral copies this assay can detect is 138 copies/mL. A negative result does not preclude SARS-Cov-2 infection and should not be used as the sole basis for treatment or other patient management decisions. A negative result may occur with  improper specimen collection/handling, submission of specimen other than nasopharyngeal swab, presence of viral mutation(s) within the areas targeted by this assay, and inadequate number of viral copies(<138 copies/mL). A negative result must be combined with clinical observations, patient history, and epidemiological information. The expected result is Negative.  Fact Sheet for Patients:  BloggerCourse.com  Fact Sheet for Healthcare Providers:  SeriousBroker.it  This test is no t yet approved or cleared by the [place] FDA and  has been authorized for detection and/or diagnosis of SARS-CoV-2 by FDA under an Emergency Use Authorization (EUA). This EUA will remain  in effect (meaning this test can be used) for the duration of the COVID-19 declaration under Section 564(b)(1) of the Act, 21 U.S.C.section 360bbb-3(b)(1), unless the authorization is terminated  or revoked sooner.       Influenza A by PCR NEGATIVE NEGATIVE Final   Influenza B by PCR NEGATIVE NEGATIVE Final     Comment: (NOTE) The Xpert Xpress SARS-CoV-2/FLU/RSV plus assay is intended as an aid in the diagnosis of influenza from Nasopharyngeal swab specimens and should not be used as a sole basis for treatment. Nasal washings and aspirates are unacceptable for Xpert Xpress SARS-CoV-2/FLU/RSV testing.  Fact Sheet for Patients: BloggerCourse.com  Fact Sheet for Healthcare Providers: SeriousBroker.it  This test is not yet approved or cleared by the [place] FDA and has been authorized for detection and/or diagnosis of SARS-CoV-2 by FDA under an Emergency Use Authorization (EUA). This EUA will remain in effect (meaning this test can be used) for the duration of the COVID-19 declaration under Section 564(b)(1) of the Act, 21 U.S.C. section 360bbb-3(b)(1), unless the authorization is terminated or revoked.  Performed at [facility], [address], [place], [place] [address]   MRSA Next Gen by PCR, Nasal     Status: None   Collection Time: 02/22/21  3:10 PM   Specimen: Nasal Mucosa; Nasal Swab  Result Value Ref Range Status   MRSA by PCR Next Gen NOT DETECTED NOT DETECTED Final    Comment: (NOTE) The GeneXpert MRSA Assay (FDA approved for NASAL specimens only), is one component of a comprehensive MRSA colonization surveillance program. It is not intended to diagnose MRSA infection nor to guide or monitor treatment for MRSA infections. Test performance is not FDA approved in patients less than 58 years old. Performed at [facility], [address], [place], [place] [address]   Culture, blood (routine x 2)     Status: None   Collection Time: 02/23/21 10:09 PM   Specimen: BLOOD  Result Value  Ref Range Status   Specimen Description   Final    BLOOD BLOOD LEFT HAND Performed at [facility], [address], [place], [place] [address]    Special Requests   Final    BOTTLES  DRAWN AEROBIC ONLY Blood Culture adequate volume Performed at [facility], [address] [place], [place] [address]    Culture   Final    NO GROWTH 5 DAYS Performed at [facility], [address], [place], [place] [address]    Report Status 03/01/2021 FINAL  Final  Culture, blood (routine x 2)     Status: None   Collection Time: 02/24/21  2:59 AM   Specimen: BLOOD  Result Value Ref Range Status   Specimen Description   Final    BLOOD BLOOD RIGHT HAND Performed at [facility], [address], [place], [place] [address]    Special Requests   Final    BOTTLES DRAWN AEROBIC ONLY Blood Culture adequate volume Performed at [facility], [address] [place], [place] [address]    Culture   Final    NO GROWTH 5 DAYS Performed at [facility], [address], [place], [place] [address]    Report Status 03/01/2021 FINAL  Final  Fungus Culture With Stain     Status: None (Preliminary result)   Collection Time: 02/24/21  9:03 AM   Specimen: PATH Cytology Peritoneal fluid; Body Fluid  Result Value Ref Range Status   Fungus Stain Final report  Final    Comment: (NOTE) Performed At: [facility] [address] [place], [place] [id number] Jolene Schimke MD [id number]    Fungus (Mycology) Culture PENDING  Incomplete   Fungal Source ABSCESS  Final    Comment: PERINEUM Performed at [facility], [address], [place], [place] [address]   Aerobic/Anaerobic Culture w Gram Stain (surgical/deep wound)     Status: None (Preliminary result)   Collection Time: 02/24/21  9:03 AM   Specimen: PATH Cytology Peritoneal fluid; Body Fluid  Result Value Ref Range Status   Specimen Description   Final    ABSCESS PERINEUM Performed at [facility], [address] [place], [place] [address]    Special Requests   Final    NONE Performed at [facility], [address] [place], [place] [address]    Gram Stain   Final    RARE WBC PRESENT,BOTH PMN AND MONONUCLEAR ABUNDANT GRAM POSITIVE RODS FEW GRAM POSITIVE COCCI IN PAIRS MODERATE GRAM NEGATIVE COCCOBACILLI    Culture   Final    FEW STREPTOCOCCUS ANGINOSIS HOLDING FOR POSSIBLE ANAEROBE Performed at [facility], [address] [place], [place] [address]    Report Status PENDING  Incomplete   Organism ID, Bacteria STREPTOCOCCUS ANGINOSIS  Final      Susceptibility   Streptococcus anginosis - MIC*    PENICILLIN <=0.06 SENSITIVE Sensitive     CEFTRIAXONE 0.5 SENSITIVE Sensitive     ERYTHROMYCIN <=0.12 SENSITIVE Sensitive     LEVOFLOXACIN 1 SENSITIVE Sensitive     VANCOMYCIN 0.5 SENSITIVE Sensitive     * FEW STREPTOCOCCUS ANGINOSIS  Acid Fast Smear (AFB)     Status: None   Collection Time: 02/24/21  9:03 AM   Specimen: PATH Cytology Peritoneal fluid; Body Fluid  Result Value Ref Range Status   AFB Specimen Processing Concentration  Final   Acid Fast Smear Negative  Final  Comment: (NOTE) Performed At: [facility] [address] [place], [place] [id number] Jolene Schimke MD QV:[id number]    Source (AFB) ABSCESS  Final    Comment: PERINEUM Performed at [facility], [address], [place], [place] [address]   Fungus Culture Result     Status: None   Collection Time: 02/24/21  9:03 AM  Result Value Ref Range Status   Result 1 Comment  Final    Comment: (NOTE) KOH/Calcofluor preparation:  no fungus observed. Performed At: [facility] [address] [place], [place] [id number] Jolene Schimke MD KZ:[id number]       Radiology Studies: No results found.   LOS: 8 days   Lanae Boast, MD [facility]  03/02/2021, 11:12 AM

## 2021-03-03 LAB — AEROBIC/ANAEROBIC CULTURE W GRAM STAIN (SURGICAL/DEEP WOUND)

## 2021-03-03 LAB — BASIC METABOLIC PANEL
Anion gap: 10 (ref 5–15)
BUN: 13 mg/dL (ref 6–20)
CO2: 26 mmol/L (ref 22–32)
Calcium: 8 mg/dL — ABNORMAL LOW (ref 8.9–10.3)
Chloride: 104 mmol/L (ref 98–111)
Creatinine, Ser: 1.05 mg/dL (ref 0.61–1.24)
GFR, Estimated: >60 mL/min (ref >60)
Glucose, Bld: 104 mg/dL — ABNORMAL HIGH (ref 70–99)
Potassium: 3.3 mmol/L — ABNORMAL LOW (ref 3.5–5.1)
Sodium: 140 mmol/L (ref 135–145)

## 2021-03-03 LAB — CBC
HCT: 35.1 % — ABNORMAL LOW (ref 39.0–52.0)
Hemoglobin: 11.4 g/dL — ABNORMAL LOW (ref 13.0–17.0)
MCH: 28.9 pg (ref 26.0–34.0)
MCHC: 32.5 g/dL (ref 30.0–36.0)
MCV: 89.1 fL (ref 80.0–100.0)
Platelets: 355 10*3/uL (ref 150–400)
RBC: 3.94 MIL/uL — ABNORMAL LOW (ref 4.22–5.81)
RDW: 14.7 % (ref 11.5–15.5)
WBC: 8.3 10*3/uL (ref 4.0–10.5)
nRBC: 0 % (ref 0.0–0.2)

## 2021-03-03 LAB — GLUCOSE, CAPILLARY
Glucose-Capillary: 121 mg/dL — ABNORMAL HIGH (ref 70–99)
Glucose-Capillary: 123 mg/dL — ABNORMAL HIGH (ref 70–99)
Glucose-Capillary: 150 mg/dL — ABNORMAL HIGH (ref 70–99)
Glucose-Capillary: 168 mg/dL — ABNORMAL HIGH (ref 70–99)
Glucose-Capillary: 191 mg/dL — ABNORMAL HIGH (ref 70–99)
Glucose-Capillary: 197 mg/dL — ABNORMAL HIGH (ref 70–99)
Glucose-Capillary: 221 mg/dL — ABNORMAL HIGH (ref 70–99)

## 2021-03-03 NOTE — Progress Notes (Signed)
Occupational Therapy Treatment Patient Details Name: Andres Richardson MRN: [id number] DOB: 01-25-1966 Today's Date: 03/03/2021    History of present illness 55 year old male admitted with intractable nausea vomiting, DKA managed with insulin drip on 7/10.  Patient however demonstrated ongoing fever and vomiting CT abdomen showed perirectal abscess for fournier gangrene surgery, urology was consulted.  Patient is status post debridement by general surgery and urology 7/12- "laparoscopic creation of loop sigmoid colostomy, laparoscopic lysis of adhesions, perineal exam under anesthesia"   OT comments  patient was educated on LB dressing with education on using reacher and sock aid to don/doff pants and socks. patient was able to demonstrate understanding with AE with supervision to complete task. Patient was educated on where to obtain LB dressing equipment. Patient verbalized understanding. Patient inquired about resources for caregivers at home. Patient was educated on speaking with SW for more info. SW was sent message for patient at this time. Patient's d/c plan remains appropriate at this time.   Follow Up Recommendations  Home health OT    Equipment Recommendations  3 in 1 bedside commode    Recommendations for Other Services      Precautions / Restrictions Precautions Precautions: Other (comment) Precaution Comments: L side colostomy, perineal wounds       Mobility Bed Mobility                    Transfers                      Balance                                           ADL either performed or assessed with clinical judgement   ADL                         Lower Body Dressing Details (indicate cue type and reason): patient was educated on LB dressing with education on using reacher and sock aid to don/doff pants and socks. patient was able to demonstrate understanding with AE with supervision to complete task.                      Vision       Perception     Praxis      Cognition Arousal/Alertness: Awake/alert Behavior During Therapy: WFL for tasks assessed/performed Overall Cognitive Status: Within Functional Limits for tasks assessed                                          Exercises     Shoulder Instructions       General Comments      Pertinent Vitals/ Pain       Pain Assessment: Faces Faces Pain Scale: Hurts little more Pain Location: abd/perineal especially with transitional movement Pain Descriptors / Indicators: Aching;Sore  Home Living                                          Prior Functioning/Environment              Frequency  Min 2X/week  Progress Toward Goals  OT Goals(current goals can now be found in the care plan section)  Progress towards OT goals: Progressing toward goals  Acute Rehab OT Goals Patient Stated Goal: to be able to get back home. OT Goal Formulation: With patient Time For Goal Achievement: 03/12/21 Potential to Achieve Goals: Good  Plan Discharge plan remains appropriate    Co-evaluation                 AM-PAC OT "6 Clicks" Daily Activity     Outcome Measure   Help from another person eating meals?: None Help from another person taking care of personal grooming?: A Little Help from another person toileting, which includes using toliet, bedpan, or urinal?: Total Help from another person bathing (including washing, rinsing, drying)?: A Lot Help from another person to put on and taking off regular upper body clothing?: A Little Help from another person to put on and taking off regular lower body clothing?: A Little 6 Click Score: 16    End of Session Equipment Utilized During Treatment: Other (comment) (reacher and sock aid.)  OT Visit Diagnosis: Pain Pain - part of body:  (pain is in abdomen and rectal area.)   Activity Tolerance Patient tolerated treatment well   Patient  Left in bed;with call bell/phone within reach   Nurse Communication Other (comment) (nurse cleared patient to participate in therapy.)        Time: 0820-0837 OT Time Calculation (min): 17 min  Charges: OT General Charges $OT Visit: 1 Visit OT Treatments $Self Care/Home Management : 8-22 mins  Sharyn Blitz OTR/L, MS Acute Rehabilitation Department Office# [phone] Pager# [phone]    [place] Guest Kate Larock 03/03/2021, 10:56 AM

## 2021-03-03 NOTE — Progress Notes (Signed)
Inpatient Diabetes Program Recommendations  AACE/ADA: New Consensus Statement on Inpatient Glycemic Control (2015)  Target Ranges:  Prepandial:   less than 140 mg/dL      Peak postprandial:   less than 180 mg/dL (1-2 hours)      Critically ill patients:  140 - 180 mg/dL   Lab Results  Component Value Date   GLUCAP 191 (H) 03/03/2021   HGBA1C 9.5 (H) 02/22/2021    Review of Glycemic Control  CBGs 123, 191 mg/dL.  Current orders for Inpatient glycemic control:  Levemir 12 units BID, Novolog 0-15 units Q4H and Novolog 3 units TID with meals  Inpatient Diabetes Program Recommendations:    Agree with orders.  Pt will need to be discharged on Novolin ReliOn 70/30 insulin at [facility]. Recommend 10 units before breakfast and 10 units before dinner.   PTA, pt was on 70/30 20 units QHS. Not appropriate timing since 70/30 is to be taken with meals.  Only concern pt has with his diabetes is making sure he gets an affordable insulin. Will also need syringes. Has meter and supplies at home.  Asked RN to allow pt to give his own insulin to check his technique.   Will continue to follow.  Thank you. Ailene Ards, RD, LDN, CDE Inpatient Diabetes Coordinator [phone]

## 2021-03-03 NOTE — Progress Notes (Signed)
6 Days Post-Op  Subjective: CC: Pain improved. Tolerating diet without abdominal pain, n/v. Reports ostomy output. Ostomy teaching with WOCN yesterday. Worked with therapies yesterday. Voiding.   Objective: Vital signs in last 24 hours: Temp:  [98.4 F (36.9 C)-99.1 F (37.3 C)] 98.9 F (37.2 C) (07/19 0318) Pulse Rate:  [82-88] 88 (07/19 0318) Resp:  [14-18] 18 (07/19 0318) BP: (149-156)/(71-83) 153/83 (07/19 0318) SpO2:  [96 %-97 %] 96 % (07/19 0318) Last BM Date: 03/03/21  Intake/Output from previous day: 07/18 0701 - 07/19 0700 In: 2242 [P.O.:2242] Out: 2800 [Urine:2800] Intake/Output this shift: Total I/O In: 472 [P.O.:472] Out: -   PE: Gen:  Alert, NAD, pleasant Pulm: Normal rate and effort  Abd: Soft, ND, NT +BS, laparoscopic sites with staples in place, c/d/I. Ostomy bag in place and reported to just by emptied. No stool in bag. Stoma viable Psych: A&Ox3 Skin: no rashes noted, warm and dry GU: Chaperone present. There is no drainage on packing. Wound as noted below with more granulation tissue when compared to yesterday. Still some fibropurulent material noted within wound    Lab Results:  Recent Labs    03/02/21 0500 03/03/21 0451  WBC 9.8 8.3  HGB 10.0* 11.4*  HCT 30.8* 35.1*  PLT 353 355   BMET Recent Labs    03/02/21 0500 03/03/21 0451  NA 141 140  K 3.5 3.3*  CL 103 104  CO2 27 26  GLUCOSE 151* 104*  BUN 14 13  CREATININE 1.08 1.05  CALCIUM 7.9* 8.0*   PT/INR No results for input(s): LABPROT, INR in the last 72 hours. CMP     Component Value Date/Time   NA 140 03/03/2021 0451   K 3.3 (L) 03/03/2021 0451   CL 104 03/03/2021 0451   CO2 26 03/03/2021 0451   GLUCOSE 104 (H) 03/03/2021 0451   BUN 13 03/03/2021 0451   CREATININE 1.05 03/03/2021 0451   CALCIUM 8.0 (L) 03/03/2021 0451   PROT 7.7 02/22/2021 1118   ALBUMIN 3.3 (L) 02/22/2021 1118   AST 10 (L) 02/22/2021 1118   ALT 10 02/22/2021 1118   ALKPHOS 80 02/22/2021 1118    BILITOT 1.4 (H) 02/22/2021 1118   GFRNONAA >60 03/03/2021 0451   GFRAA >60 05/14/2019 0317   Lipase     Component Value Date/Time   LIPASE 193 (H) 05/10/2019 1946       Studies/Results: No results found.  Anti-infectives: Anti-infectives (From admission, onward)    Start     Dose/Rate Route Frequency Ordered Stop   02/27/21 2000  Ampicillin-Sulbactam (UNASYN) 3 g in sodium chloride 0.9 % 100 mL IVPB        3 g 200 mL/hr over 30 Minutes Intravenous Every 6 hours 02/27/21 1313     02/25/21 2200  vancomycin (VANCOREADY) IVPB 750 mg/150 mL  Status:  Discontinued        750 mg 150 mL/hr over 60 Minutes Intravenous Every 12 hours 02/25/21 1349 02/27/21 1313   02/24/21 1000  vancomycin (VANCOREADY) IVPB 1000 mg/200 mL  Status:  Discontinued        1,000 mg 200 mL/hr over 60 Minutes Intravenous Every 12 hours 02/23/21 2215 02/25/21 1349   02/23/21 2215  clindamycin (CLEOCIN) IVPB 600 mg        600 mg 100 mL/hr over 30 Minutes Intravenous Every 8 hours 02/23/21 2122 02/26/21 2209   02/23/21 2200  vancomycin (VANCOREADY) IVPB 2000 mg/400 mL        2,000 mg  200 mL/hr over 120 Minutes Intravenous  Once 02/23/21 2133 02/23/21 2353   02/23/21 2200  piperacillin-tazobactam (ZOSYN) IVPB 3.375 g  Status:  Discontinued        3.375 g 12.5 mL/hr over 4 Hours Intravenous Every 8 hours 02/23/21 2133 02/27/21 1313        Assessment/Plan POD 7 I&D perineum 15 x 10 x 8 cm, containing skin, subcutaneous tissue, fascia for Fournier's gangrene - Dr. Sheliah Hatch 7/12 POD 6 laparoscopic loop sigmoid colostomy with LOA - Dr. Sheliah Hatch - 7/13 - Cx w/ strep anginosis. Cont abx. Can transition to oral abx  - Tolerating diet and having ostomy output - Will discuss with MD timing of stoma rod removal  - WOC following for teaching and assisting with leakage from pouching device - written for [facility] RN and referral placed to ostomy clinic - PT/OT, mobilize. Recommended for [facility] - written for  - wound care:  Increased to TID WTD dressing changes 7/18. Wound looks cleaner today.  - Suspect if wound continues to improve tomorrow he may be stable for discharge from our standpoint tomorrow    FEN: CM, IVF per TRH VTE: SCDs, LMWH  ID: Unasyn Foley: Voiding    - below per primary - DKA/T2DM, poorly controlled HLD HTN PAD with claudication Tobacco abuse Obesity - BMI 31.27   LOS: 9 days    Jacinto Halim , [facility] Surgery 03/03/2021, 9:18 AM Please see Amion for pager number during day hours 7:00am-4:30pm

## 2021-03-03 NOTE — Progress Notes (Signed)
PROGRESS NOTE    Andres Richardson  SJG:[id number] DOB: 10-11-1965 DOA: 02/22/2021 PCP: [name], MD   No chief complaint on file.  Brief Narrative: 55 year old male with diabetes to with diabetes admitted with intractable nausea vomiting, DKA managed with insulin drip on 7/10.  Patient however demonstrated ongoing fever and vomiting CT abdomen showed perirectal abscess for fournier gangrene surgery, urology was consulted. Patient is status post debridement by general surgery and urology 7/12- "laparoscopic creation of loop sigmoid colostomy, laparoscopic lysis of adhesions, perineal exam under anesthesia" Patient colostomy started have some bowel activity 7/15.  Diet was advanced.  Antibiotic changed to Unasyn as were culture growing strep. Patient transferred to MedSurg 7/15 Clinically improving having colostomy output tolerating regular diet  Subjective: Seen this morning.  He is resting comfortably has no new complaints.  Tolerating diet. Assessment & Plan:  Fournier's gangrene: CCS and urology following- s/p debridement 7/12 and loop sigmoid colostomy, LOS by gen surgery 7/13.  WBC fluctuating but stable this morning.  Afebrile.   OR culture 7/12- stain w/ abundant GPR, few GPC GNCB- culture w/ strep anginosis, no anerobes-after discussion with surgery antibiotic adjusted to Unasyn 7/15.  Overall clinically improving ostomy output 650 mL tolerating current modified diet. He completed Clindamycinx 72 hr. dressing changed by surgery today wound looks much cleaner with granulation tissue if continues to improve he may be stable for discharge tomorrow,  Hypokalemia: Repleted  DKA-resolved Type 2 diabetes mellitus- With oorly controlled, w/ hba1c 9.5.On glipizide metformin  and insulin Blood sugar well controlled at this time continue Levemir 12 BID,NovoLog 3 units 3 times daily SSI.   Recent Labs  Lab 03/02/21 1949 03/03/21 0008 03/03/21 0318 03/03/21 0732 03/03/21 1050  GLUCAP 206*  150* 121* 123* 191*   AKI: Resolved Hypernatremia resolved Hypertension:stable. Not on meds at home.  Stable Tobacco abuse continue nicotine patch.  Cessation advise.  Patient  Morbid obesity BMI  31-He will benefit with weight loss healthy lifestyle.  Diet Order             Diet Carb Modified Fluid consistency: Thin; Room service appropriate? Yes  Diet effective now                   Patient's Body mass index is 31.27 kg/m. DVT prophylaxis: enoxaparin (LOVENOX) injection 40 mg Start: 02/26/21 1000SCD. Lovenox once okay w Surgery Code Status:   Code Status: DNR  Family Communication: plan of care discussed with patient at bedside.  Status is: Inpatient Remains inpatient appropriate because:IV treatments appropriate due to intensity of illness or inability to take PO and Inpatient level of care appropriate due to severity of illness Dispo: The patient is from: Home              Anticipated d/c is to: HHPT.Once okAy w/ surgery.              Patient currently is not medically stable to d/c.   Difficult to place patient No Unresulted Labs (From admission, onward)     Start     Ordered   02/24/21 0909  Acid Fast Culture with reflexed sensitivities  RELEASE UPON ORDERING,   TIMED       Comments: Specimen A: Pre-op diagnosis: fournier gangrene    02/24/21 0909            Medications reviewed:  Scheduled Meds:  (feeding supplement) PROSource Plus  30 mL Oral BID BM   acetaminophen  1,000 mg Oral Q6H  vitamin C  500 mg Oral BID   Chlorhexidine Gluconate Cloth  6 each Topical Q0600   cyanocobalamin  1,000 mcg Intramuscular Daily   enoxaparin (LOVENOX) injection  40 mg Subcutaneous Q24H   feeding supplement  1 Container Oral BID BM   insulin aspart  0-15 Units Subcutaneous Q4H   insulin aspart  3 Units Subcutaneous TID WC   insulin detemir  12 Units Subcutaneous BID   mouth rinse  15 mL Mouth Rinse BID   methocarbamol  500 mg Oral TID   multivitamin with minerals  1  tablet Oral Daily   nicotine  21 mg Transdermal Daily   nutrition supplement (JUVEN)  1 packet Oral BID BM   pantoprazole  40 mg Oral Daily   potassium chloride  20 mEq Oral Daily   zinc sulfate  220 mg Oral Daily   Continuous Infusions:  ampicillin-sulbactam (UNASYN) IV 3 g (03/03/21 0955)   promethazine (PHENERGAN) injection (IM or IVPB) Stopped (02/25/21 0444)    Consultants:see note  Procedures:see note  Antimicrobials: Anti-infectives (From admission, onward)    Start     Dose/Rate Route Frequency Ordered Stop   02/27/21 2000  Ampicillin-Sulbactam (UNASYN) 3 g in sodium chloride 0.9 % 100 mL IVPB        3 g 200 mL/hr over 30 Minutes Intravenous Every 6 hours 02/27/21 1313     02/25/21 2200  vancomycin (VANCOREADY) IVPB 750 mg/150 mL  Status:  Discontinued        750 mg 150 mL/hr over 60 Minutes Intravenous Every 12 hours 02/25/21 1349 02/27/21 1313   02/24/21 1000  vancomycin (VANCOREADY) IVPB 1000 mg/200 mL  Status:  Discontinued        1,000 mg 200 mL/hr over 60 Minutes Intravenous Every 12 hours 02/23/21 2215 02/25/21 1349   02/23/21 2215  clindamycin (CLEOCIN) IVPB 600 mg        600 mg 100 mL/hr over 30 Minutes Intravenous Every 8 hours 02/23/21 2122 02/26/21 2209   02/23/21 2200  vancomycin (VANCOREADY) IVPB 2000 mg/400 mL        2,000 mg 200 mL/hr over 120 Minutes Intravenous  Once 02/23/21 2133 02/23/21 2353   02/23/21 2200  piperacillin-tazobactam (ZOSYN) IVPB 3.375 g  Status:  Discontinued        3.375 g 12.5 mL/hr over 4 Hours Intravenous Every 8 hours 02/23/21 2133 02/27/21 1313      Culture/Microbiology    Component Value Date/Time   SDES  02/24/2021 7829    ABSCESS PERINEUM Performed at [facility], [address], [place], [place] [address]    SPECREQUEST  02/24/2021 0865    NONE Performed at [facility], [address] [place], [place] [address]    CULT  02/24/2021 [phone]    FEW STREPTOCOCCUS ANGINOSIS FEW  PREVOTELLA SPECIES BETA LACTAMASE POSITIVE Performed at [facility], [address], [place], [place] [address]    REPTSTATUS 03/03/2021 FINAL 02/24/2021 2440    Other culture-see note  Objective: Vitals: Today's Vitals   03/03/21 0318 03/03/21 0509 03/03/21 0555 03/03/21 0912  BP: (!) 153/83     Pulse: 88     Resp: 18     Temp: 98.9 F (37.2 C)     TempSrc: Oral     SpO2: 96%     Weight:      Height:      PainSc:  8  5  0-No pain    Intake/Output Summary (Last 24  hours) at 03/03/2021 1058 Last data filed at 03/03/2021 0909 Gross per 24 hour  Intake 2124 ml  Output 2625 ml  Net -501 ml    Filed Weights   02/22/21 1509 02/23/21 0400 02/24/21 0751  Weight: 101.1 kg 101.7 kg 101.7 kg   Weight change:   Intake/Output from previous day: 07/18 0701 - 07/19 0700 In: 2242 [P.O.:2242] Out: 2800 [Urine:2800] Intake/Output this shift: Total I/O In: 472 [P.O.:472] Out: -  Filed Weights   02/22/21 1509 02/23/21 0400 02/24/21 0751  Weight: 101.1 kg 101.7 kg 101.7 kg    Examination: General exam: AAOx 3, pleasant,  older than stated age, weak appearing. HEENT:Oral mucosa moist, Ear/Nose WNL grossly, dentition normal. Respiratory system: bilaterally clear breath sounds, no use of accessory muscle Cardiovascular system: S1 & S2 +, No JVD,. Gastrointestinal system: Abdomen soft, NT,ND, BS+ Nervous System:Alert, awake, moving extremities and grossly nonfocal Extremities: no edema, distal peripheral pulses palpable.  Skin: No rashes,no icterus. MSK: Normal muscle bulk,tone, power    Data Reviewed: I have personally reviewed following labs and imaging studies CBC: Recent Labs  Lab 02/25/21 0300 02/26/21 0249 02/27/21 0245 02/28/21 0531 03/01/21 0556 03/02/21 0500 03/03/21 0451  WBC 10.5   < > 10.2 12.5* 10.4 9.8 8.3  NEUTROABS 8.6*  --   --   --   --   --   --   HGB 10.8*   < > 9.6* 10.5* 9.6* 10.0* 11.4*  HCT 33.6*   < > 30.8* 33.1* 29.4* 30.8* 35.1*  MCV  91.1   < > 92.5 91.7 90.2 89.3 89.1  PLT 222   < > 254 291 307 353 355   < > = values in this interval not displayed.    Basic Metabolic Panel: Recent Labs  Lab 02/27/21 0245 02/28/21 0531 03/01/21 0556 03/02/21 0500 03/03/21 0451  NA 136 141 141 141 140  K 2.9* 3.5 3.1* 3.5 3.3*  CL 104 106 105 103 104  CO2 23 26 26 27 26   GLUCOSE 127* 161* 140* 151* 104*  BUN 22* 17 12 14 13   CREATININE 1.22 1.05 0.92 1.08 1.05  CALCIUM 6.8* 7.5* 7.5* 7.9* 8.0*    GFR: Estimated Creatinine Clearance: 96.6 mL/min (by C-G formula based on SCr of 1.05 mg/dL). Liver Function Tests: No results for input(s): AST, ALT, ALKPHOS, BILITOT, PROT, ALBUMIN in the last 168 hours.  No results for input(s): LIPASE, AMYLASE in the last 168 hours. No results for input(s): AMMONIA in the last 168 hours. Coagulation Profile: No results for input(s): INR, PROTIME in the last 168 hours. Cardiac Enzymes: No results for input(s): CKTOTAL, CKMB, CKMBINDEX, TROPONINI in the last 168 hours. BNP (last 3 results) No results for input(s): PROBNP in the last 8760 hours. HbA1C: No results for input(s): HGBA1C in the last 72 hours.  CBG: Recent Labs  Lab 03/02/21 1949 03/03/21 0008 03/03/21 0318 03/03/21 0732 03/03/21 1050  GLUCAP 206* 150* 121* 123* 191*    Lipid Profile: No results for input(s): CHOL, HDL, LDLCALC, TRIG, CHOLHDL, LDLDIRECT in the last 72 hours. Thyroid Function Tests: No results for input(s): TSH, T4TOTAL, FREET4, T3FREE, THYROIDAB in the last 72 hours. Anemia Panel: No results for input(s): VITAMINB12, FOLATE, FERRITIN, TIBC, IRON, RETICCTPCT in the last 72 hours.  Sepsis Labs: No results for input(s): PROCALCITON, LATICACIDVEN in the last 168 hours.  Recent Results (from the past 240 hour(s))  Resp Panel by RT-PCR (Flu A&B, Covid) Nasopharyngeal Swab     Status: None  Collection Time: 02/22/21  1:25 PM   Specimen: Nasopharyngeal Swab; Nasopharyngeal(NP) swabs in vial transport  medium  Result Value Ref Range Status   SARS Coronavirus 2 by RT PCR NEGATIVE NEGATIVE Final    Comment: (NOTE) SARS-CoV-2 target nucleic acids are NOT DETECTED.  The SARS-CoV-2 RNA is generally detectable in upper respiratory specimens during the acute phase of infection. The lowest concentration of SARS-CoV-2 viral copies this assay can detect is 138 copies/mL. A negative result does not preclude SARS-Cov-2 infection and should not be used as the sole basis for treatment or other patient management decisions. A negative result may occur with  improper specimen collection/handling, submission of specimen other than nasopharyngeal swab, presence of viral mutation(s) within the areas targeted by this assay, and inadequate number of viral copies(<138 copies/mL). A negative result must be combined with clinical observations, patient history, and epidemiological information. The expected result is Negative.  Fact Sheet for Patients:  BloggerCourse.com  Fact Sheet for Healthcare Providers:  SeriousBroker.it  This test is no t yet approved or cleared by the [place] FDA and  has been authorized for detection and/or diagnosis of SARS-CoV-2 by FDA under an Emergency Use Authorization (EUA). This EUA will remain  in effect (meaning this test can be used) for the duration of the COVID-19 declaration under Section 564(b)(1) of the Act, 21 U.S.C.section 360bbb-3(b)(1), unless the authorization is terminated  or revoked sooner.       Influenza A by PCR NEGATIVE NEGATIVE Final   Influenza B by PCR NEGATIVE NEGATIVE Final    Comment: (NOTE) The Xpert Xpress SARS-CoV-2/FLU/RSV plus assay is intended as an aid in the diagnosis of influenza from Nasopharyngeal swab specimens and should not be used as a sole basis for treatment. Nasal washings and aspirates are unacceptable for Xpert Xpress SARS-CoV-2/FLU/RSV testing.  Fact Sheet for  Patients: BloggerCourse.com  Fact Sheet for Healthcare Providers: SeriousBroker.it  This test is not yet approved or cleared by the [place] FDA and has been authorized for detection and/or diagnosis of SARS-CoV-2 by FDA under an Emergency Use Authorization (EUA). This EUA will remain in effect (meaning this test can be used) for the duration of the COVID-19 declaration under Section 564(b)(1) of the Act, 21 U.S.C. section 360bbb-3(b)(1), unless the authorization is terminated or revoked.  Performed at [facility], [address] [place], [place] [address]   MRSA Next Gen by PCR, Nasal     Status: None   Collection Time: 02/22/21  3:10 PM   Specimen: Nasal Mucosa; Nasal Swab  Result Value Ref Range Status   MRSA by PCR Next Gen NOT DETECTED NOT DETECTED Final    Comment: (NOTE) The GeneXpert MRSA Assay (FDA approved for NASAL specimens only), is one component of a comprehensive MRSA colonization surveillance program. It is not intended to diagnose MRSA infection nor to guide or monitor treatment for MRSA infections. Test performance is not FDA approved in patients less than 94 years old. Performed at [facility], [address] [place], [place] [address]   Culture, blood (routine x 2)     Status: None   Collection Time: 02/23/21 10:09 PM   Specimen: BLOOD  Result Value Ref Range Status   Specimen Description   Final    BLOOD BLOOD LEFT HAND Performed at [facility], [address] [place], [place] [address]    Special Requests   Final    BOTTLES DRAWN AEROBIC ONLY Blood Culture adequate volume Performed  at [facility], [address] [place], [place] [address]    Culture   Final    NO GROWTH 5 DAYS Performed at [facility], [address] [place], [place] [address]    Report Status 03/01/2021 FINAL  Final  Culture, blood  (routine x 2)     Status: None   Collection Time: 02/24/21  2:59 AM   Specimen: BLOOD  Result Value Ref Range Status   Specimen Description   Final    BLOOD BLOOD RIGHT HAND Performed at [facility], [address] [place], [place] [address]    Special Requests   Final    BOTTLES DRAWN AEROBIC ONLY Blood Culture adequate volume Performed at [facility], [address] [place], [place] [address]    Culture   Final    NO GROWTH 5 DAYS Performed at [facility], [address] [place], [place] [address]    Report Status 03/01/2021 FINAL  Final  Fungus Culture With Stain     Status: None (Preliminary result)   Collection Time: 02/24/21  9:03 AM   Specimen: PATH Cytology Peritoneal fluid; Body Fluid  Result Value Ref Range Status   Fungus Stain Final report  Final    Comment: (NOTE) Performed At: [facility] [address] [place], [place] [id number] Jolene SchimkeNagendra Sanjai MD ON:[id number]:[phone]    Fungus (Mycology) Culture PENDING  Incomplete   Fungal Source ABSCESS  Final    Comment: PERINEUM Performed at [facility], [address] [place], [place] [address]   Aerobic/Anaerobic Culture w Gram Stain (surgical/deep wound)     Status: None   Collection Time: 02/24/21  9:03 AM   Specimen: PATH Cytology Peritoneal fluid; Body Fluid  Result Value Ref Range Status   Specimen Description   Final    ABSCESS PERINEUM Performed at [facility], [address] [place], [place] [address]    Special Requests   Final    NONE Performed at [facility], [address] [place], [place] [address]    Gram Stain   Final    RARE WBC PRESENT,BOTH PMN AND MONONUCLEAR ABUNDANT GRAM POSITIVE RODS FEW GRAM POSITIVE COCCI IN PAIRS MODERATE GRAM NEGATIVE COCCOBACILLI    Culture   Final    FEW STREPTOCOCCUS ANGINOSIS FEW PREVOTELLA SPECIES BETA LACTAMASE POSITIVE Performed at [facility], [address] [place], [place] [address]    Report Status 03/03/2021 FINAL  Final   Organism ID, Bacteria STREPTOCOCCUS ANGINOSIS  Final      Susceptibility   Streptococcus anginosis - MIC*    PENICILLIN <=0.06 SENSITIVE Sensitive     CEFTRIAXONE 0.5 SENSITIVE Sensitive     ERYTHROMYCIN <=0.12 SENSITIVE Sensitive     LEVOFLOXACIN 1 SENSITIVE Sensitive     VANCOMYCIN 0.5 SENSITIVE Sensitive     * FEW STREPTOCOCCUS ANGINOSIS  Acid Fast Smear (AFB)     Status: None   Collection Time: 02/24/21  9:03 AM   Specimen: PATH Cytology Peritoneal fluid; Body Fluid  Result Value Ref Range Status   AFB Specimen Processing Concentration  Final   Acid Fast Smear Negative  Final    Comment: (NOTE) Performed At: [facility]  [address] [place], [place] [id number] Jolene SchimkeNagendra Sanjai MD EP:[id number]:[phone]    Source (AFB) ABSCESS  Final    Comment: PERINEUM Performed at [facility], [address] [place], [place] [address]   Fungus  Culture Result     Status: None   Collection Time: 02/24/21  9:03 AM  Result Value Ref Range Status   Result 1 Comment  Final    Comment: (NOTE) KOH/Calcofluor preparation:  no fungus observed. Performed At: [facility] [address] [place], [place] [address] Jolene Schimke MD WU:[id number]       Radiology Studies: No results found.   LOS: 9 days   Lanae Boast, MD [facility]  03/03/2021, 10:58 AM

## 2021-03-04 LAB — GLUCOSE, CAPILLARY
Glucose-Capillary: 193 mg/dL — ABNORMAL HIGH (ref 70–99)
Glucose-Capillary: 188 mg/dL — ABNORMAL HIGH (ref 70–99)
Glucose-Capillary: 232 mg/dL — ABNORMAL HIGH (ref 70–99)
Glucose-Capillary: 180 mg/dL — ABNORMAL HIGH (ref 70–99)
Glucose-Capillary: 163 mg/dL — ABNORMAL HIGH (ref 70–99)
Glucose-Capillary: 162 mg/dL — ABNORMAL HIGH (ref 70–99)

## 2021-03-04 NOTE — Consult Note (Addendum)
WOC Nurse ostomy follow up Surgical team following for assessment and plan of care for perineal wound.    PA at the bedside to remove plastic rod from stoma. Stoma is located in a deep crease at 3:00 o'clock and 9:00 o'clock.  Stoma is red and oval, approx  1 3/8 X 2 inches, flush with skin level, intact skin surrounding after rod and sutures removed.   Stoma type/location: LLQ, loop colostomy. Pt assisted with pouch change using barrier ring and one piece flexible pouch and belt.  He is able to open and close independently to empty.  Output: mod amt loose brown stool   Enrolled patient in [employer] Discharge program: Yes, previously. 5 sets of convex pouches [employer] [place] # [phone] and 10 barrier rings [employer] [place] # [phone])  and a belt left at the bedside for use after discharge.  Reviewed pouching routines and ordering supplies.  Pt states he feels confident with pouch application. He has been provided with indigent product information and is aware he needs to call to set it up after discharge.    Thank-you,  Cammie Mcgee MSN, RN, CWOCN, [place], CNS [phone]

## 2021-03-04 NOTE — Progress Notes (Signed)
PROGRESS NOTE    Andres Richardson  ZOX:[id number] DOB: [birth year] DOA: 02/22/2021 PCP: Shirlean MylarWebb, Carol, MD   No chief complaint on file.  Brief Narrative: 55 year old male with diabetes to with diabetes admitted with intractable nausea vomiting, DKA managed with insulin drip on 7/10.  Patient however demonstrated ongoing fever and vomiting CT abdomen showed perirectal abscess for fournier gangrene surgery, urology was consulted. Patient is status post debridement by general surgery and urology 7/12- "laparoscopic creation of loop sigmoid colostomy, laparoscopic lysis of adhesions, perineal exam under anesthesia" Patient colostomy started have some bowel activity 7/15.  Diet was advanced.  Antibiotic changed to Unasyn as were culture growing strep. Patient transferred to MedSurg 7/15 Clinically improving having colostomy output tolerating regular diet  Subjective: Seen this am. Concerned abt not having to get HH at home Concerned abt dressing. His friend goes to work and cannot do dressing.  Assessment & Plan:  Fournier's gangrene: CCS and urology following- s/p debridement 7/12 and loop sigmoid colostomy, LOS by gen surgery 7/13.  WBC fluctuating but stable this morning.  Afebrile.   OR culture 7/12- stain w/ abundant GPR, few GPC GNCB- culture w/ strep anginosis, no anerobes-after discussion with surgery antibiotic adjusted to Unasyn 7/15.  Overall clinically improving, surgery is okay with discharge, looking into arranging home health versus SNF   Hypokalemia: Repleted  DKA-resolved Type 2 diabetes mellitus- With oorly controlled, w/ hba1c 9.5.On glipizide metformin  and insulin.blood sugar controlled continue current Levemir but at home he will use NovoLog 70/30 10 units twice daily-follow up w/ endocrine/PCP to adjust insulin. Recent Labs  Lab 03/03/21 2039 03/03/21 2355 03/04/21 0434 03/04/21 0746 03/04/21 1155  GLUCAP 221* 197* 193* 188* 232*   AKI: Resolved Hypernatremia  resolved Hypertension:stable. Not on meds at home.  Stable Tobacco abuse continue nicotine patch.  Cessation advise.  Patient  Morbid obesity BMI  31-He will benefit with weight loss healthy lifestyle.  Diet Order             Diet Carb Modified Fluid consistency: Thin; Room service appropriate? Yes  Diet effective now                   Patient's Body mass index is 31.27 kg/m. DVT prophylaxis: enoxaparin (LOVENOX) injection 40 mg Start: 02/26/21 1000SCD. Lovenox once okay w Surgery Code Status:   Code Status: DNR  Family Communication: plan of care discussed with patient at bedside.  Status is: Inpatient Remains inpatient appropriate because:IV treatments appropriate due to intensity of illness or inability to take PO and Inpatient level of care appropriate due to severity of illness Dispo: The patient is from: Home.              Anticipated d/c is to: HHPT likely tomorrow.              Patient currently is not medically stable to d/c.   Difficult to place patient No Unresulted Labs (From admission, onward)     Start     Ordered   02/24/21 0909  Acid Fast Culture with reflexed sensitivities  RELEASE UPON ORDERING,   TIMED       Comments: Specimen A: Pre-op diagnosis: fournier gangrene    02/24/21 0909            Medications reviewed:  Scheduled Meds:  (feeding supplement) PROSource Plus  30 mL Oral BID BM   acetaminophen  1,000 mg Oral Q6H   vitamin C  500 mg Oral BID   Chlorhexidine  Gluconate Cloth  6 each Topical Q0600   cyanocobalamin  1,000 mcg Intramuscular Daily   enoxaparin (LOVENOX) injection  40 mg Subcutaneous Q24H   feeding supplement  1 Container Oral BID BM   insulin aspart  0-15 Units Subcutaneous Q4H   insulin aspart  3 Units Subcutaneous TID WC   insulin detemir  12 Units Subcutaneous BID   mouth rinse  15 mL Mouth Rinse BID   methocarbamol  500 mg Oral TID   multivitamin with minerals  1 tablet Oral Daily   nicotine  21 mg Transdermal Daily    nutrition supplement (JUVEN)  1 packet Oral BID BM   pantoprazole  40 mg Oral Daily   potassium chloride  20 mEq Oral Daily   zinc sulfate  220 mg Oral Daily   Continuous Infusions:  ampicillin-sulbactam (UNASYN) IV 3 g (03/04/21 1318)   promethazine (PHENERGAN) injection (IM or IVPB) Stopped (02/25/21 0444)    Consultants:see note  Procedures:see note  Antimicrobials: Anti-infectives (From admission, onward)    Start     Dose/Rate Route Frequency Ordered Stop   02/27/21 2000  Ampicillin-Sulbactam (UNASYN) 3 g in sodium chloride 0.9 % 100 mL IVPB        3 g 200 mL/hr over 30 Minutes Intravenous Every 6 hours 02/27/21 1313     02/25/21 2200  vancomycin (VANCOREADY) IVPB 750 mg/150 mL  Status:  Discontinued        750 mg 150 mL/hr over 60 Minutes Intravenous Every 12 hours 02/25/21 1349 02/27/21 1313   02/24/21 1000  vancomycin (VANCOREADY) IVPB 1000 mg/200 mL  Status:  Discontinued        1,000 mg 200 mL/hr over 60 Minutes Intravenous Every 12 hours 02/23/21 2215 02/25/21 1349   02/23/21 2215  clindamycin (CLEOCIN) IVPB 600 mg        600 mg 100 mL/hr over 30 Minutes Intravenous Every 8 hours 02/23/21 2122 02/26/21 2209   02/23/21 2200  vancomycin (VANCOREADY) IVPB 2000 mg/400 mL        2,000 mg 200 mL/hr over 120 Minutes Intravenous  Once 02/23/21 2133 02/23/21 2353   02/23/21 2200  piperacillin-tazobactam (ZOSYN) IVPB 3.375 g  Status:  Discontinued        3.375 g 12.5 mL/hr over 4 Hours Intravenous Every 8 hours 02/23/21 2133 02/27/21 1313      Culture/Microbiology    Component Value Date/Time   SDES  02/24/2021 3716    ABSCESS PERINEUM Performed at [facility], [address], [place], [place] [address]    SPECREQUEST  02/24/2021 3810    NONE Performed at [facility], [address], [place], [place] [address]    CULT  02/24/2021 [id number]    FEW STREPTOCOCCUS ANGINOSIS FEW PREVOTELLA SPECIES BETA LACTAMASE POSITIVE Performed at  [facility], [address] [place], [place] [address]    REPTSTATUS 03/03/2021 FINAL 02/24/2021 4235    Other culture-see note  Objective: Vitals: Today's Vitals   03/04/21 0542 03/04/21 0915 03/04/21 0959 03/04/21 1321  BP:    (!) 149/79  Pulse:    84  Resp:    18  Temp:    98.4 F (36.9 C)  TempSrc:    Oral  SpO2:    97%  Weight:      Height:      PainSc: 2  6  4       Intake/Output Summary (Last 24 hours) at 03/04/2021 1424 Last data filed at 03/04/2021 1000  Gross per 24 hour  Intake 1660 ml  Output 1050 ml  Net 610 ml    Filed Weights   02/22/21 1509 02/23/21 0400 02/24/21 0751  Weight: 101.1 kg 101.7 kg 101.7 kg   Weight change:   Intake/Output from previous day: 07/19 0701 - 07/20 0700 In: 1892 [P.O.:1892] Out: 2050 [Urine:2050] Intake/Output this shift: Total I/O In: 240 [P.O.:240] Out: 50 [Stool:50] Filed Weights   02/22/21 1509 02/23/21 0400 02/24/21 0751  Weight: 101.1 kg 101.7 kg 101.7 kg    Examination: General exam: AAOx3,older than stated age, weak appearing. HEENT:Oral mucosa moist, Ear/Nose WNL grossly, dentition normal. Respiratory system: bilaterally clear, no use of accessory muscle Cardiovascular system: S1 & S2 +, No JVD,. Gastrointestinal system: Abdomen soft, NT,ND, BS+ perineal wound in dressing Nervous System:Alert, awake, moving extremities and grossly nonfocal Extremities: no edema, distal peripheral pulses palpable.  Skin: No rashes,no icterus. MSK: Normal muscle bulk,tone, power    Data Reviewed: I have personally reviewed following labs and imaging studies CBC: Recent Labs  Lab 02/27/21 0245 02/28/21 0531 03/01/21 0556 03/02/21 0500 03/03/21 0451  WBC 10.2 12.5* 10.4 9.8 8.3  HGB 9.6* 10.5* 9.6* 10.0* 11.4*  HCT 30.8* 33.1* 29.4* 30.8* 35.1*  MCV 92.5 91.7 90.2 89.3 89.1  PLT 254 291 307 353 355    Basic Metabolic Panel: Recent Labs  Lab 02/27/21 0245 02/28/21 0531 03/01/21 0556 03/02/21 0500  03/03/21 0451  NA 136 141 141 141 140  K 2.9* 3.5 3.1* 3.5 3.3*  CL 104 106 105 103 104  CO2 23 26 26 27 26   GLUCOSE 127* 161* 140* 151* 104*  BUN 22* 17 12 14 13   CREATININE 1.22 1.05 0.92 1.08 1.05  CALCIUM 6.8* 7.5* 7.5* 7.9* 8.0*    GFR: Estimated Creatinine Clearance: 96.6 mL/min (by C-G formula based on SCr of 1.05 mg/dL). Liver Function Tests: No results for input(s): AST, ALT, ALKPHOS, BILITOT, PROT, ALBUMIN in the last 168 hours.  No results for input(s): LIPASE, AMYLASE in the last 168 hours. No results for input(s): AMMONIA in the last 168 hours. Coagulation Profile: No results for input(s): INR, PROTIME in the last 168 hours. Cardiac Enzymes: No results for input(s): CKTOTAL, CKMB, CKMBINDEX, TROPONINI in the last 168 hours. BNP (last 3 results) No results for input(s): PROBNP in the last 8760 hours. HbA1C: No results for input(s): HGBA1C in the last 72 hours.  CBG: Recent Labs  Lab 03/03/21 2039 03/03/21 2355 03/04/21 0434 03/04/21 0746 03/04/21 1155  GLUCAP 221* 197* 193* 188* 232*    Lipid Profile: No results for input(s): CHOL, HDL, LDLCALC, TRIG, CHOLHDL, LDLDIRECT in the last 72 hours. Thyroid Function Tests: No results for input(s): TSH, T4TOTAL, FREET4, T3FREE, THYROIDAB in the last 72 hours. Anemia Panel: No results for input(s): VITAMINB12, FOLATE, FERRITIN, TIBC, IRON, RETICCTPCT in the last 72 hours.  Sepsis Labs: No results for input(s): PROCALCITON, LATICACIDVEN in the last 168 hours.  Recent Results (from the past 240 hour(s))  MRSA Next Gen by PCR, Nasal     Status: None   Collection Time: 02/22/21  3:10 PM   Specimen: Nasal Mucosa; Nasal Swab  Result Value Ref Range Status   MRSA by PCR Next Gen NOT DETECTED NOT DETECTED Final    Comment: (NOTE) The GeneXpert MRSA Assay (FDA approved for NASAL specimens only), is one component of a comprehensive MRSA colonization surveillance program. It is not intended to diagnose MRSA infection  nor to guide or monitor treatment for MRSA infections. Test  performance is not FDA approved in patients less than 6 years old. Performed at [facility], [address] [place], [place] [address]   Culture, blood (routine x 2)     Status: None   Collection Time: 02/23/21 10:09 PM   Specimen: BLOOD  Result Value Ref Range Status   Specimen Description   Final    BLOOD BLOOD LEFT HAND Performed at [facility], [address], [place], [place] [address]    Special Requests   Final    BOTTLES DRAWN AEROBIC ONLY Blood Culture adequate volume Performed at [facility], [address], [place], [place] [address]    Culture   Final    NO GROWTH 5 DAYS Performed at [facility], [address], [place], [place] [address]    Report Status 03/01/2021 FINAL  Final  Culture, blood (routine x 2)     Status: None   Collection Time: 02/24/21  2:59 AM   Specimen: BLOOD  Result Value Ref Range Status   Specimen Description   Final    BLOOD BLOOD RIGHT HAND Performed at [facility], [address] [place], [place] [address]    Special Requests   Final    BOTTLES DRAWN AEROBIC ONLY Blood Culture adequate volume Performed at [facility], [address], [place], [place] [address]    Culture   Final    NO GROWTH 5 DAYS Performed at [facility], [address], [place], [place] [address]    Report Status 03/01/2021 FINAL  Final  Fungus Culture With Stain     Status: None (Preliminary result)   Collection Time: 02/24/21  9:03 AM   Specimen: PATH Cytology Peritoneal fluid; Body Fluid  Result Value Ref Range Status   Fungus Stain Final report  Final    Comment: (NOTE) Performed At: [facility] [address] [place], [place] [id number] Jolene Schimke MD IZ:[id number]    Fungus (Mycology) Culture PENDING  Incomplete   Fungal Source ABSCESS  Final    Comment:  PERINEUM Performed at [facility], [address] [place], [place] [address]   Aerobic/Anaerobic Culture w Gram Stain (surgical/deep wound)     Status: None   Collection Time: 02/24/21  9:03 AM   Specimen: PATH Cytology Peritoneal fluid; Body Fluid  Result Value Ref Range Status   Specimen Description   Final    ABSCESS PERINEUM Performed at [facility], [address], [place], [place] [address]    Special Requests   Final    NONE Performed at [facility], [address], [place], [place] [address]    Gram Stain   Final    RARE WBC PRESENT,BOTH PMN AND MONONUCLEAR ABUNDANT GRAM POSITIVE RODS FEW GRAM POSITIVE COCCI IN PAIRS MODERATE GRAM NEGATIVE COCCOBACILLI    Culture   Final    FEW STREPTOCOCCUS ANGINOSIS FEW PREVOTELLA SPECIES BETA LACTAMASE POSITIVE Performed at [facility], [address] [place], [place] [address]    Report Status 03/03/2021 FINAL  Final   Organism ID, Bacteria STREPTOCOCCUS ANGINOSIS  Final      Susceptibility   Streptococcus anginosis - MIC*    PENICILLIN <=0.06 SENSITIVE Sensitive     CEFTRIAXONE 0.5 SENSITIVE Sensitive     ERYTHROMYCIN <=0.12 SENSITIVE Sensitive     LEVOFLOXACIN 1 SENSITIVE Sensitive     VANCOMYCIN 0.5 SENSITIVE Sensitive     * FEW  STREPTOCOCCUS ANGINOSIS  Acid Fast Smear (AFB)     Status: None   Collection Time: 02/24/21  9:03 AM   Specimen: PATH Cytology Peritoneal fluid; Body Fluid  Result Value Ref Range Status   AFB Specimen Processing Concentration  Final   Acid Fast Smear Negative  Final    Comment: (NOTE) Performed At: [facility] [address] [place], [place] [id number] Jolene Schimke MD WU:[id number]    Source (AFB) ABSCESS  Final    Comment: PERINEUM Performed at [facility], [address], [place], [place] [address]   Fungus Culture Result     Status: None   Collection Time: 02/24/21  9:03 AM  Result Value  Ref Range Status   Result 1 Comment  Final    Comment: (NOTE) KOH/Calcofluor preparation:  no fungus observed. Performed At: [facility] [address] [place], [place] [id number] Jolene Schimke MD NG:[id number]       Radiology Studies: No results found.   LOS: 10 days   Lanae Boast, MD [facility]  03/04/2021, 2:24 PM

## 2021-03-04 NOTE — Progress Notes (Signed)
7 Days Post-Op  Subjective: CC: Doing well. No abdominal pain, n/v. Ostomy functioning (nothing recorded on I/O however). Tolerating dressing changes.   Objective: Vital signs in last 24 hours: Temp:  [98.3 F (36.8 C)-99.1 F (37.3 C)] 98.3 F (36.8 C) (07/20 0455) Pulse Rate:  [86-95] 95 (07/20 0455) Resp:  [16-20] 18 (07/20 0455) BP: (130-158)/(81-93) 137/81 (07/20 0455) SpO2:  [96 %-100 %] 97 % (07/20 0455) Last BM Date: 03/03/21  Intake/Output from previous day: 07/19 0701 - 07/20 0700 In: 1892 [P.O.:1892] Out: 2050 [Urine:2050] Intake/Output this shift: No intake/output data recorded.  PE: Gen:  Alert, NAD, pleasant Pulm: Normal rate and effort  Abd: Soft, ND, NT +BS, laparoscopic sites with staples in place, c/d/I. Stoma viable. Stoma rod removed.  Psych: A&Ox3 Skin: no rashes noted, warm and dry GU: Chaperone present. There is no drainage on packing. Wound as noted below with >85% granulation tissue. No drainage.       Lab Results:  Recent Labs    03/02/21 0500 03/03/21 0451  WBC 9.8 8.3  HGB 10.0* 11.4*  HCT 30.8* 35.1*  PLT 353 355   BMET Recent Labs    03/02/21 0500 03/03/21 0451  NA 141 140  K 3.5 3.3*  CL 103 104  CO2 27 26  GLUCOSE 151* 104*  BUN 14 13  CREATININE 1.08 1.05  CALCIUM 7.9* 8.0*   PT/INR No results for input(s): LABPROT, INR in the last 72 hours. CMP     Component Value Date/Time   NA 140 03/03/2021 0451   K 3.3 (L) 03/03/2021 0451   CL 104 03/03/2021 0451   CO2 26 03/03/2021 0451   GLUCOSE 104 (H) 03/03/2021 0451   BUN 13 03/03/2021 0451   CREATININE 1.05 03/03/2021 0451   CALCIUM 8.0 (L) 03/03/2021 0451   PROT 7.7 02/22/2021 1118   ALBUMIN 3.3 (L) 02/22/2021 1118   AST 10 (L) 02/22/2021 1118   ALT 10 02/22/2021 1118   ALKPHOS 80 02/22/2021 1118   BILITOT 1.4 (H) 02/22/2021 1118   GFRNONAA >60 03/03/2021 0451   GFRAA >60 05/14/2019 0317   Lipase     Component Value Date/Time   LIPASE 193 (H)  05/10/2019 1946       Studies/Results: No results found.  Anti-infectives: Anti-infectives (From admission, onward)    Start     Dose/Rate Route Frequency Ordered Stop   02/27/21 2000  Ampicillin-Sulbactam (UNASYN) 3 g in sodium chloride 0.9 % 100 mL IVPB        3 g 200 mL/hr over 30 Minutes Intravenous Every 6 hours 02/27/21 1313     02/25/21 2200  vancomycin (VANCOREADY) IVPB 750 mg/150 mL  Status:  Discontinued        750 mg 150 mL/hr over 60 Minutes Intravenous Every 12 hours 02/25/21 1349 02/27/21 1313   02/24/21 1000  vancomycin (VANCOREADY) IVPB 1000 mg/200 mL  Status:  Discontinued        1,000 mg 200 mL/hr over 60 Minutes Intravenous Every 12 hours 02/23/21 2215 02/25/21 1349   02/23/21 2215  clindamycin (CLEOCIN) IVPB 600 mg        600 mg 100 mL/hr over 30 Minutes Intravenous Every 8 hours 02/23/21 2122 02/26/21 2209   02/23/21 2200  vancomycin (VANCOREADY) IVPB 2000 mg/400 mL        2,000 mg 200 mL/hr over 120 Minutes Intravenous  Once 02/23/21 2133 02/23/21 2353   02/23/21 2200  piperacillin-tazobactam (ZOSYN) IVPB 3.375 g  Status:  Discontinued        3.375 g 12.5 mL/hr over 4 Hours Intravenous Every 8 hours 02/23/21 2133 02/27/21 1313        Assessment/Plan POD 8 I&D perineum 15 x 10 x 8 cm, containing skin, subcutaneous tissue, fascia for Fournier's gangrene - Dr. Sheliah Hatch 7/12 POD 7 laparoscopic loop sigmoid colostomy with LOA - Dr. Sheliah Hatch - 7/13 - Cx w/ strep anginosis, Prevotella, beta lactamase +. Cont abx for 14 days total. Can transition to oral abx  - Tolerating diet and having ostomy output - Stoma rod removal  - WOC following for teaching. Referral placed to ostomy clinic - PT/OT, mobilize. Recommended for [facility] - written for  - Wound care: Wound clean. BID WTD - Stable for d/c from our standpoint. However it appears he may not be eligible for [facility], reports he does not have anyone to help with dressing changes at home and does not feel he could  change this himself. CM working on exploring what options. May need SNF? If cannot get wound care at home.    FEN: CM, IVF per TRH VTE: SCDs, LMWH  ID: Unasyn Foley: Voiding    - below per primary - DKA/T2DM, poorly controlled HLD HTN PAD with claudication Tobacco abuse Obesity - BMI 31.27   LOS: 10 days    Jacinto Halim , [facility] 03/04/2021, 10:01 AM Please see Amion for pager number during day hours 7:00am-4:30pm

## 2021-03-04 NOTE — TOC Initial Note (Addendum)
Transition of Care [facility]) - Initial/Assessment Note    Patient Details  Name: Andres Richardson MRN: [id number] Date of Birth: 1966-07-05  [facility] CM/SW Contact:    Ida Rogue, LCSW Phone Number: 03/04/2021, 9:21 AM  Clinical Narrative:    Contacted charity [facility] to ask for [facility] RN, PT.  Was seen by Pearson Grippe, who responded that household income is too high to qualify for charity.  Andres Richardson states he is comfortable with pouch changes, feels he needs no help with that.  Currently wound care requiring dressing change TID.  I contacted WOC, surgery to update.  Will follow up with patient who states he has an RN friend who can help, but not TID nor BID. TOC will continue to follow during the course of hospitalization.  Addendum:  Spoke with patient, roommate Andres Richardson.  Andres Richardson contacted RN friend yesterday evening.  She is working 12 hour shifts-unable to help currently.  Significant other listed in chart is Andres Richardson's sister, and she is not an option.  Andres Richardson has reached out to his cousin in [place] who is a retired Chief Financial Officer to see if she might be able to come help him out.  Hopes to have an answer by the end of the day.                Expected Discharge Plan: Home/Self Care Barriers to Discharge: Other (must enter comment) (Plan for wound care at home)   Patient Goals and CMS Choice        Expected Discharge Plan and Services Expected Discharge Plan: Home/Self Care   Discharge Planning Services: CM Consult   Living arrangements for the past 2 months: Single Family Home                                      Prior Living Arrangements/Services Living arrangements for the past 2 months: Single Family Home Lives with:: Roommate Patient language and need for interpreter reviewed:: Yes        Need for Family Participation in Patient Care: Yes (Comment) Care giver support system in place?: Yes (comment)   Criminal Activity/Legal Involvement Pertinent to  Current Situation/Hospitalization: No - Comment as needed  Activities of Daily Living Home Assistive Devices/Equipment: Eyeglasses, CBG Meter ADL Screening (condition at time of admission) Patient's cognitive ability adequate to safely complete daily activities?: Yes Is the patient deaf or have difficulty hearing?: No Does the patient have difficulty seeing, even when wearing glasses/contacts?: No Does the patient have difficulty concentrating, remembering, or making decisions?: No Patient able to express need for assistance with ADLs?: Yes Does the patient have difficulty dressing or bathing?: Yes Independently performs ADLs?: No Communication: Independent Dressing (OT): Needs assistance Is this a change from baseline?: Change from baseline, expected to last >3 days Grooming: Independent Feeding: Independent Bathing: Needs assistance Is this a change from baseline?: Change from baseline, expected to last >3 days Toileting: Needs assistance Is this a change from baseline?: Change from baseline, expected to last >3days In/Out Bed: Needs assistance Is this a change from baseline?: Change from baseline, expected to last >3 days Walks in Home: Needs assistance Is this a change from baseline?: Change from baseline, expected to last >3 days Does the patient have difficulty walking or climbing stairs?: Yes (secondary to weakness) Weakness of Legs: Both Weakness of Arms/Hands: None  Permission Sought/Granted Permission sought to share information  with : Family Supports Permission granted to share information with : Yes, Verbal Permission Granted  Share Information with NAME: Andres Richardson (Friend)   [phone]           Emotional Assessment Appearance:: Appears stated age Attitude/Demeanor/Rapport: Engaged Affect (typically observed): Appropriate Orientation: : Oriented to Self, Oriented to Place, Oriented to  Time, Oriented to Situation Alcohol / Substance Use: Not  Applicable Psych Involvement: No (comment)  Admission diagnosis:  DKA (diabetic ketoacidosis) (HCC) [E11.10] Patient Active Problem List   Diagnosis Date Noted   Fournier's gangrene 02/23/2021   DKA (diabetic ketoacidosis) (HCC) 02/22/2021   DKA (diabetic ketoacidoses) 05/11/2019   PCP:  [name], MD Pharmacy:   [facility] [address], [place] - [address] [place] [address] Phone: [phone] Fax: [phone]     Social Determinants of Health (SDOH) Interventions    Readmission Risk Interventions No flowsheet data found.

## 2021-03-04 NOTE — Anesthesia Postprocedure Evaluation (Signed)
Anesthesia Post Note  Patient: Andres Richardson  Procedure(s) Performed: INCISION AND DRAINAGE PERINEUM     Patient location during evaluation: PACU Anesthesia Type: General Level of consciousness: awake and alert Pain management: pain level controlled Vital Signs Assessment: post-procedure vital signs reviewed and stable Respiratory status: spontaneous breathing, nonlabored ventilation, respiratory function stable and patient connected to nasal cannula oxygen Cardiovascular status: blood pressure returned to baseline and stable Postop Assessment: no apparent nausea or vomiting Anesthetic complications: no   No notable events documented.  Last Vitals:   Reviewed and stable              Fin Hupp

## 2021-03-04 NOTE — Progress Notes (Signed)
PT Cancellation Note  Patient Details Name: Andres Richardson MRN: [id number] DOB: [birth year]   Cancelled Treatment:    Reason Eval/Treat Not Completed: Fatigue/lethargy limiting ability to participate attempted to coordinate pain meds with nursing and RN reports pt did not get much sleep last night and currently sleeping.  Will check back as schedule permits.   Demareon Coldwell,KATHrine E 03/04/2021, 2:15 PM Paulino Door, DPT Acute Rehabilitation Services Pager: [phone] Office: [phone]

## 2021-03-05 LAB — GLUCOSE, CAPILLARY
Glucose-Capillary: 112 mg/dL — ABNORMAL HIGH (ref 70–99)
Glucose-Capillary: 128 mg/dL — ABNORMAL HIGH (ref 70–99)
Glucose-Capillary: 172 mg/dL — ABNORMAL HIGH (ref 70–99)
Glucose-Capillary: 186 mg/dL — ABNORMAL HIGH (ref 70–99)
Glucose-Capillary: 196 mg/dL — ABNORMAL HIGH (ref 70–99)
Glucose-Capillary: 210 mg/dL — ABNORMAL HIGH (ref 70–99)

## 2021-03-05 MED ORDER — ENSURE MAX PROTEIN PO LIQD
11.0000 [oz_av] | Freq: Two times a day (BID) | ORAL | Status: DC
  Administered 2021-03-07: 11 [oz_av] via ORAL
  Filled 2021-03-05 (×9): qty 330

## 2021-03-05 NOTE — Progress Notes (Signed)
PROGRESS NOTE    Andres Richardson  QQV:[id number] DOB: 1966/03/07 DOA: 02/22/2021 PCP: [name], MD   No chief complaint on file.  Brief Narrative: 55 year old male with diabetes to with diabetes admitted with intractable nausea vomiting, DKA managed with insulin drip on 7/10.  Patient however demonstrated ongoing fever and vomiting CT abdomen showed perirectal abscess for fournier gangrene surgery, urology was consulted. Patient is status post debridement by general surgery and urology 7/12- "laparoscopic creation of loop sigmoid colostomy, laparoscopic lysis of adhesions, perineal exam under anesthesia" Patient colostomy started have some bowel activity 7/15.  Diet was advanced.  Antibiotic changed to Unasyn as were culture growing strep. Patient transferred to MedSurg 7/15 Clinically improving having colostomy output tolerating regular diet  Subjective: Seen and examined.  Doing well tolerating diet.  He is concerned about wound care at home as he has nobody to help. He spoke to his cousin and he has not heard back from her yet  Assessment & Plan:  Fournier's gangrene: CCS and urology following- s/p debridement 7/12 and loop sigmoid colostomy, LOS by gen surgery 7/13.  WBC fluctuating but stable this morning.  Afebrile.   OR culture 7/12-w/ strep anginosis, no anerobes-now on Unasyn and improving since 7/15.  Continue wound dressing as per surgery currently on twice daily.  Looking for home health to arrange for wound care if not may need SNF.    DKA-resolved Type 2 diabetes mellitus-poorly controlled w/ hba1c 9.5.On glipizide metformin  and insulin.blood sugar controlled continue current Levemir but at home he will use NovoLog 70/30 10 units twice daily, and metformin.  Advised to hold off on glipizide and follow-up with PCP to adjust meds. Recent Labs  Lab 03/04/21 2001 03/04/21 2308 03/05/21 0321 03/05/21 0753 03/05/21 1130  GLUCAP 163* 162* 128* 112* 186*   AKI:  Resolved Hypokalemia: Stable Hypernatremia resolved  Hypertension:stable.  Not on meds.  Tobacco abuse continue nicotine patch.  Cessation advise.  Patient  Morbid obesity BMI  31-He will benefit with weight loss healthy lifestyle.  Dietitian consulted.  Diet Order             Diet Carb Modified Fluid consistency: Thin; Room service appropriate? Yes  Diet effective now                   Patient's Body mass index is 31.27 kg/m. DVT prophylaxis: enoxaparin (LOVENOX) injection 40 mg Start: 02/26/21 1000SCD. Lovenox once okay w Surgery Code Status:   Code Status: DNR  Family Communication: plan of care discussed with patient at bedside.  Status is: Inpatient Remains inpatient appropriate because:IV treatments appropriate due to intensity of illness or inability to take PO and Inpatient level of care appropriate due to severity of illness Dispo: The patient is from: Home.              Anticipated d/c is to: HHPT               Patient currently is medically stable for discharge once home health set up. Per SW HH rejected.   Difficult to place patient No Unresulted Labs (From admission, onward)     Start     Ordered   02/24/21 0909  Acid Fast Culture with reflexed sensitivities  RELEASE UPON ORDERING,   TIMED       Comments: Specimen A: Pre-op diagnosis: fournier gangrene    02/24/21 0909            Medications reviewed:  Scheduled Meds:  acetaminophen  1,000 mg Oral Q6H   vitamin C  500 mg Oral BID   Chlorhexidine Gluconate Cloth  6 each Topical Q0600   cyanocobalamin  1,000 mcg Intramuscular Daily   enoxaparin (LOVENOX) injection  40 mg Subcutaneous Q24H   insulin aspart  0-15 Units Subcutaneous Q4H   insulin aspart  3 Units Subcutaneous TID WC   insulin detemir  12 Units Subcutaneous BID   mouth rinse  15 mL Mouth Rinse BID   methocarbamol  500 mg Oral TID   multivitamin with minerals  1 tablet Oral Daily   nicotine  21 mg Transdermal Daily   nutrition  supplement (JUVEN)  1 packet Oral BID BM   pantoprazole  40 mg Oral Daily   potassium chloride  20 mEq Oral Daily   Ensure Max Protein  11 oz Oral BID   zinc sulfate  220 mg Oral Daily   Continuous Infusions:  ampicillin-sulbactam (UNASYN) IV 3 g (03/05/21 0850)   promethazine (PHENERGAN) injection (IM or IVPB) Stopped (02/25/21 0444)    Consultants:see note  Procedures:see note  Antimicrobials: Anti-infectives (From admission, onward)    Start     Dose/Rate Route Frequency Ordered Stop   02/27/21 2000  Ampicillin-Sulbactam (UNASYN) 3 g in sodium chloride 0.9 % 100 mL IVPB        3 g 200 mL/hr over 30 Minutes Intravenous Every 6 hours 02/27/21 1313     02/25/21 2200  vancomycin (VANCOREADY) IVPB 750 mg/150 mL  Status:  Discontinued        750 mg 150 mL/hr over 60 Minutes Intravenous Every 12 hours 02/25/21 1349 02/27/21 1313   02/24/21 1000  vancomycin (VANCOREADY) IVPB 1000 mg/200 mL  Status:  Discontinued        1,000 mg 200 mL/hr over 60 Minutes Intravenous Every 12 hours 02/23/21 2215 02/25/21 1349   02/23/21 2215  clindamycin (CLEOCIN) IVPB 600 mg        600 mg 100 mL/hr over 30 Minutes Intravenous Every 8 hours 02/23/21 2122 02/26/21 2209   02/23/21 2200  vancomycin (VANCOREADY) IVPB 2000 mg/400 mL        2,000 mg 200 mL/hr over 120 Minutes Intravenous  Once 02/23/21 2133 02/23/21 2353   02/23/21 2200  piperacillin-tazobactam (ZOSYN) IVPB 3.375 g  Status:  Discontinued        3.375 g 12.5 mL/hr over 4 Hours Intravenous Every 8 hours 02/23/21 2133 02/27/21 1313      Culture/Microbiology    Component Value Date/Time   SDES  02/24/2021 1308    ABSCESS PERINEUM Performed at [facility], [address] [place], [place] [address]    SPECREQUEST  02/24/2021 6962    NONE Performed at [facility], [address], [place], [place] [address]    CULT  02/24/2021 [phone]    FEW STREPTOCOCCUS ANGINOSIS FEW PREVOTELLA SPECIES BETA  LACTAMASE POSITIVE Performed at [facility], [address], [place], [place] [address]    REPTSTATUS 03/03/2021 FINAL 02/24/2021 7253    Other culture-see note  Objective: Vitals: Today's Vitals   03/05/21 0749 03/05/21 0905 03/05/21 0950 03/05/21 1320  BP:    (!) 150/78  Pulse:    87  Resp:    18  Temp:    98.4 F (36.9 C)  TempSrc:      SpO2:    97%  Weight:      Height:      PainSc: Asleep 5  Asleep  Intake/Output Summary (Last 24 hours) at 03/05/2021 1330 Last data filed at 03/05/2021 0847 Gross per 24 hour  Intake 834 ml  Output 1825 ml  Net -991 ml    Filed Weights   02/22/21 1509 02/23/21 0400 02/24/21 0751  Weight: 101.1 kg 101.7 kg 101.7 kg   Weight change:   Intake/Output from previous day: 07/20 0701 - 07/21 0700 In: 720 [P.O.:720] Out: 1475 [Urine:1425; Stool:50] Intake/Output this shift: Total I/O In: 354 [P.O.:354] Out: 400 [Urine:400] Filed Weights   02/22/21 1509 02/23/21 0400 02/24/21 0751  Weight: 101.1 kg 101.7 kg 101.7 kg    Examination: General exam: AAOx 3, pleasant, older than stated age, weak appearing. HEENT:Oral mucosa moist, Ear/Nose WNL grossly, dentition normal. Respiratory system: bilaterally diminished,  no use of accessory muscle Cardiovascular system: S1 & S2 +, No JVD,. Gastrointestinal system: Abdomen soft,NT,ND, BS+ Nervous System:Alert, awake, moving extremities and grossly nonfocal Extremities: no edema, distal peripheral pulses palpable.  Skin: No rashes,no icterus. MSK: Normal muscle bulk,tone, power  Perineal site with dressing in place  Data Reviewed: I have personally reviewed following labs and imaging studies CBC: Recent Labs  Lab 02/27/21 0245 02/28/21 0531 03/01/21 0556 03/02/21 0500 03/03/21 0451  WBC 10.2 12.5* 10.4 9.8 8.3  HGB 9.6* 10.5* 9.6* 10.0* 11.4*  HCT 30.8* 33.1* 29.4* 30.8* 35.1*  MCV 92.5 91.7 90.2 89.3 89.1  PLT 254 291 307 353 355    Basic Metabolic Panel: Recent Labs   Lab 02/27/21 0245 02/28/21 0531 03/01/21 0556 03/02/21 0500 03/03/21 0451  NA 136 141 141 141 140  K 2.9* 3.5 3.1* 3.5 3.3*  CL 104 106 105 103 104  CO2 23 26 26 27 26   GLUCOSE 127* 161* 140* 151* 104*  BUN 22* 17 12 14 13   CREATININE 1.22 1.05 0.92 1.08 1.05  CALCIUM 6.8* 7.5* 7.5* 7.9* 8.0*    GFR: Estimated Creatinine Clearance: 96.6 mL/min (by C-G formula based on SCr of 1.05 mg/dL). Liver Function Tests: No results for input(s): AST, ALT, ALKPHOS, BILITOT, PROT, ALBUMIN in the last 168 hours.  No results for input(s): LIPASE, AMYLASE in the last 168 hours. No results for input(s): AMMONIA in the last 168 hours. Coagulation Profile: No results for input(s): INR, PROTIME in the last 168 hours. Cardiac Enzymes: No results for input(s): CKTOTAL, CKMB, CKMBINDEX, TROPONINI in the last 168 hours. BNP (last 3 results) No results for input(s): PROBNP in the last 8760 hours. HbA1C: No results for input(s): HGBA1C in the last 72 hours.  CBG: Recent Labs  Lab 03/04/21 2001 03/04/21 2308 03/05/21 0321 03/05/21 0753 03/05/21 1130  GLUCAP 163* 162* 128* 112* 186*    Lipid Profile: No results for input(s): CHOL, HDL, LDLCALC, TRIG, CHOLHDL, LDLDIRECT in the last 72 hours. Thyroid Function Tests: No results for input(s): TSH, T4TOTAL, FREET4, T3FREE, THYROIDAB in the last 72 hours. Anemia Panel: No results for input(s): VITAMINB12, FOLATE, FERRITIN, TIBC, IRON, RETICCTPCT in the last 72 hours.  Sepsis Labs: No results for input(s): PROCALCITON, LATICACIDVEN in the last 168 hours.  Recent Results (from the past 240 hour(s))  Culture, blood (routine x 2)     Status: None   Collection Time: 02/23/21 10:09 PM   Specimen: BLOOD  Result Value Ref Range Status   Specimen Description   Final    BLOOD BLOOD LEFT HAND Performed at [facility], [address] [place], [place]    Special Requests   Final    BOTTLES DRAWN AEROBIC ONLY  Blood  Culture adequate volume Performed at [facility], [address] [place], [place] [address]    Culture   Final    NO GROWTH 5 DAYS Performed at [facility], [address] [place], [place] [address]    Report Status 03/01/2021 FINAL  Final  Culture, blood (routine x 2)     Status: None   Collection Time: 02/24/21  2:59 AM   Specimen: BLOOD  Result Value Ref Range Status   Specimen Description   Final    BLOOD BLOOD RIGHT HAND Performed at [facility], [address] [place], [place] [address]    Special Requests   Final    BOTTLES DRAWN AEROBIC ONLY Blood Culture adequate volume Performed at [facility], [address] [place], [place] [address]    Culture   Final    NO GROWTH 5 DAYS Performed at [facility], [address] [place], [place] [address]    Report Status 03/01/2021 FINAL  Final  Fungus Culture With Stain     Status: None (Preliminary result)   Collection Time: 02/24/21  9:03 AM   Specimen: PATH Cytology Peritoneal fluid; Body Fluid  Result Value Ref Range Status   Fungus Stain Final report  Final    Comment: (NOTE) Performed At: [facility] [address] [place], [place] [id number] Jolene SchimkeNagendra Sanjai MD ON:[phone]    Fungus (Mycology) Culture PENDING  Incomplete   Fungal Source ABSCESS  Final    Comment: PERINEUM Performed at [facility], [address] [place], [place] [address]   Aerobic/Anaerobic Culture w Gram Stain (surgical/deep wound)     Status: None   Collection Time: 02/24/21  9:03 AM   Specimen: PATH Cytology Peritoneal fluid; Body Fluid  Result Value Ref Range Status   Specimen Description   Final    ABSCESS PERINEUM Performed at [facility], [address] [place], [place] [address]    Special Requests   Final    NONE Performed at [facility], [address] [place], [place] [address]     Gram Stain   Final    RARE WBC PRESENT,BOTH PMN AND MONONUCLEAR ABUNDANT GRAM POSITIVE RODS FEW GRAM POSITIVE COCCI IN PAIRS MODERATE GRAM NEGATIVE COCCOBACILLI    Culture   Final    FEW STREPTOCOCCUS ANGINOSIS FEW PREVOTELLA SPECIES BETA LACTAMASE POSITIVE Performed at [facility], [address] [place], [place] [address]    Report Status 03/03/2021 FINAL  Final   Organism ID, Bacteria STREPTOCOCCUS ANGINOSIS  Final      Susceptibility   Streptococcus anginosis - MIC*    PENICILLIN <=0.06 SENSITIVE Sensitive     CEFTRIAXONE 0.5 SENSITIVE Sensitive     ERYTHROMYCIN <=0.12 SENSITIVE Sensitive     LEVOFLOXACIN 1 SENSITIVE Sensitive     VANCOMYCIN 0.5 SENSITIVE Sensitive     * FEW STREPTOCOCCUS ANGINOSIS  Acid Fast Smear (AFB)     Status: None   Collection Time: 02/24/21  9:03 AM   Specimen: PATH Cytology Peritoneal fluid; Body Fluid  Result Value Ref Range Status   AFB Specimen Processing Concentration  Final   Acid Fast Smear Negative  Final    Comment: (NOTE) Performed At: [facility] [address] [place], [place] [id number] Jolene SchimkeNagendra Sanjai MD EP:[id number]    Source (AFB) ABSCESS  Final    Comment: PERINEUM Performed at [facility], [address] [place] [address]   Fungus Culture Result     Status: None   Collection Time: 02/24/21  9:03 AM  Result Value Ref Range Status   Result 1 Comment  Final    Comment: (NOTE) KOH/Calcofluor preparation:  no fungus observed. Performed At: [facility] [address] [place], [place] [address] Jolene Schimke MD FA:[id number]       Radiology Studies: No results found.   LOS: 11 days   Lanae Boast, MD [facility]  03/05/2021, 1:30 PM

## 2021-03-05 NOTE — Progress Notes (Signed)
Occupational Therapy Treatment Patient Details Name: Andres Richardson MRN: [id number] DOB: Feb 23, 1966 Today's Date: 03/05/2021    History of present illness 55 year old male admitted with intractable nausea vomiting, DKA managed with insulin drip on 7/10.  Patient however demonstrated ongoing fever and vomiting CT abdomen showed perirectal abscess for fournier gangrene surgery, urology was consulted.  Patient is status post debridement by general surgery and urology 7/12- "laparoscopic creation of loop sigmoid colostomy, laparoscopic lysis of adhesions, perineal exam under anesthesia"   OT comments  Pt progressing well towards OT goals, pleasant and participatory throughout session. Extended time spent collaborating with pt on compensatory strategies for bed mobility, ADLs, and IADLs in the home to minimize pain in wound region. With task modification, pt able to demo laps in room using RW and ADLs standing at Supervision level. Pt able to demo increased independence with LB dressing tasks bed level and with minimized pain. Plan to further assess carryover of LB ADL strategies and standing tolerance for safe ADL completion at home. Updated DME recs to include rolling walker as pt does endorse feeling more stable using this AD.    Follow Up Recommendations  Home health OT    Equipment Recommendations  3 in 1 bedside commode;Other (comment) (Rolling walker)    Recommendations for Other Services      Precautions / Restrictions Precautions Precautions: Other (comment) Precaution Comments: L side colostomy, perineal wounds Restrictions Weight Bearing Restrictions: No       Mobility Bed Mobility Overal bed mobility: Needs Assistance Bed Mobility: Rolling;Sidelying to Sit;Sit to Sidelying Rolling: Supervision Sidelying to sit: Supervision     Sit to sidelying: Supervision General bed mobility comments: use of bed rail, assistance to raise bed for comfort but pt able to demo all bed  mobility without physical assist using strategies to minimize pain    Transfers Overall transfer level: Needs assistance Equipment used: Rolling walker (2 wheeled) Transfers: Sit to/from Stand Sit to Stand: Supervision         General transfer comment: supervision for safety    Balance Overall balance assessment: Needs assistance Sitting-balance support: No upper extremity supported;Feet supported Sitting balance-Leahy Scale: Fair     Standing balance support: Single extremity supported;Bilateral upper extremity supported;During functional activity Standing balance-Leahy Scale: Fair Standing balance comment: fair static standing, benefits from UE support with mobility                           ADL either performed or assessed with clinical judgement   ADL Overall ADL's : Needs assistance/impaired     Grooming: Set up;Standing;Brushing hair Grooming Details (indicate cue type and reason): able to alternate UEs on RW to complete task in standing             Lower Body Dressing: Minimal assistance;Bed level Lower Body Dressing Details (indicate cue type and reason): Collaborated on compensatory strategies for LB dressing to minimize pain/prolonged sitting. Encouraged pt that dressing in bed may be the best method intiially with pt able to demo figure four position to bring feet to self and bridge hips in bed with minimal pain             Functional mobility during ADLs: Supervision/safety;Rolling walker General ADL Comments: Collaborated with pt on compensatory strategies for ADLs/bed mobility and IADLs at home. Once pt in standing, able to complete various tasks without LOB. Likely able to sponge bathe standing at sink, perform LB dressing bed level and place  cat food bowls/litter box in higher place if bending found to be an issue at home.     Vision   Vision Assessment?: No apparent visual deficits   Perception     Praxis      Cognition  Arousal/Alertness: Awake/alert Behavior During Therapy: WFL for tasks assessed/performed Overall Cognitive Status: Within Functional Limits for tasks assessed                                 General Comments: very pleasant and participatory        Exercises     Shoulder Instructions       General Comments      Pertinent Vitals/ Pain       Pain Assessment: Faces Faces Pain Scale: Hurts little more Pain Location: bottom with bed mobility/sitting Pain Descriptors / Indicators: Grimacing;Sore Pain Intervention(s): Monitored during session;Premedicated before session  Home Living                                          Prior Functioning/Environment              Frequency  Min 2X/week        Progress Toward Goals  OT Goals(current goals can now be found in the care plan section)  Progress towards OT goals: Progressing toward goals  Acute Rehab OT Goals Patient Stated Goal: to be able to get back home. OT Goal Formulation: With patient Time For Goal Achievement: 03/12/21 Potential to Achieve Goals: Good ADL Goals Pt Will Perform Lower Body Dressing: with modified independence;sit to/from stand Additional ADL Goal #1: Patient will stand at sink to perform grooming task as evidence of improving activity tolerance Additional ADL Goal #2: Patient will perform 10 min functional activity or exercise activity as evidence of improving activity tolerance  Plan Discharge plan remains appropriate    Co-evaluation                 AM-PAC OT "6 Clicks" Daily Activity     Outcome Measure   Help from another person eating meals?: None Help from another person taking care of personal grooming?: A Little Help from another person toileting, which includes using toliet, bedpan, or urinal?: A Lot Help from another person bathing (including washing, rinsing, drying)?: A Little Help from another person to put on and taking off regular upper  body clothing?: A Little Help from another person to put on and taking off regular lower body clothing?: A Little 6 Click Score: 18    End of Session Equipment Utilized During Treatment: Rolling walker  OT Visit Diagnosis: Pain Pain - part of body:  (bottom)   Activity Tolerance Patient tolerated treatment well   Patient Left in bed;with call bell/phone within reach   Nurse Communication Mobility status        Time: 1100-1136 OT Time Calculation (min): 36 min  Charges: OT General Charges $OT Visit: 1 Visit OT Treatments $Self Care/Home Management : 8-22 mins $Therapeutic Activity: 8-22 mins  Bradd Canary, OTR/L Acute Rehab Services Office: [phone]    Lorre Munroe 03/05/2021, 12:31 PM

## 2021-03-05 NOTE — Progress Notes (Signed)
Nutrition Follow-up  DOCUMENTATION CODES:   Obesity unspecified  INTERVENTION:   -Needs updated weight (last recorded 7/12)  -Juven Fruit Punch BID, each serving provides 95kcal and 2.5g of protein (amino acids glutamine and arginine)  -Ensure MAX Protein po BID, each supplement provides 150 kcal and 30 grams of protein   -Multivitamin with minerals daily  -D/c Prosource, Boost Breeze  NUTRITION DIAGNOSIS:   Increased nutrient needs related to post-op healing, wound healing as evidenced by estimated needs.  Ongoing.  GOAL:   Patient will meet greater than or equal to 90% of their needs  Progressing.  MONITOR:   PO intake, Supplement acceptance, Diet advancement, Labs, Weight trends, Skin  ASSESSMENT:   55 year old male with medical history of DM, PAD, HTN, HLD, and microalbuminuria. He was admitted on 7/10 with intractable N/V and found to be in DKA. CT scan showed perirectal abscess and he was dx with Fournier's gangrene.  7/10: admitted 7/12: s/p debridement of perineum 7/13: s/p colostomy, lap LOA  Currently consuming 0-100% of meals at this time. Patient refusing Prosource supplements, will d/c. Only accepted 1 Boost Breeze in the past 2 days. Will continue vitamins and Juven.  Admission weight: 205 lbs. Last recorded weight: 224 lbs.  I/Os: -1.1L since admit UOP: 1825 ml x 24 hrs  Medications: Vitamin C, Vitamin B-12, Multivitamin with minerals daily, KLOR-CON, Zinc sulfate  Labs reviewed: CBGs: 112-232  Diet Order:   Diet Order             Diet Carb Modified Fluid consistency: Thin; Room service appropriate? Yes  Diet effective now                   EDUCATION NEEDS:   Not appropriate for education at this time  Skin:  Skin Assessment: Skin Integrity Issues: Skin Integrity Issues:: Incisions Incisions: perineum (7/12 and 7/13); abdomen (7/13 x2)  Last BM:  7/8 (2 days PTA), per documentation in flow sheet  Height:   Ht Readings  from Last 1 Encounters:  02/24/21 5\' 11"  (1.803 m)    Weight:   Wt Readings from Last 1 Encounters:  02/24/21 101.7 kg    Ideal Body Weight:  78.2 kg  BMI:  Body mass index is 31.27 kg/m.  Estimated Nutritional Needs:   Kcal:  2350-2600 kcal  Protein:  120-135 grams  Fluid:  >/= 2.5 L/day  04/27/21, MS, RD, LDN Inpatient Clinical Dietitian Contact information available via Amion

## 2021-03-05 NOTE — Progress Notes (Signed)
Physical Therapy Treatment Patient Details Name: Andres Richardson: [id number] DOB: 03/18/1966 Today's Date: 03/05/2021    History of Present Illness 55 year old male admitted with intractable nausea vomiting, DKA managed with insulin drip on 7/10.  Patient however demonstrated ongoing fever and vomiting CT abdomen showed perirectal abscess for fournier gangrene surgery, urology was consulted.  Patient is status post debridement by general surgery and urology 7/12- "laparoscopic creation of loop sigmoid colostomy, laparoscopic lysis of adhesions, perineal exam under anesthesia"    PT Comments    Pt is progressing toward acute PT goals with increased ambulation distance today. Pt demonstrated compensatory strategies he has learned himself and during OT session to perform bed mobility for pain control. Pt ambulated total of ~257ft with MIN guard- supervision for safety requiring x1 standing rest break. Pt will benefit from skilled PT to increase their independence and safety with mobility to allow discharge to the venue listed below.    Follow Up Recommendations  Home health PT     Equipment Recommendations  None recommended by PT    Recommendations for Other Services       Precautions / Restrictions Precautions Precautions: Other (comment) Precaution Comments: perianal wounds with mobility Restrictions Weight Bearing Restrictions: No    Mobility  Bed Mobility Overal bed mobility: Needs Assistance Bed Mobility: Rolling;Sidelying to Sit;Sit to Sidelying Rolling: Supervision Sidelying to sit: Supervision     Sit to sidelying: Supervision General bed mobility comments: pt with use of strategies he has learned and from OT session earlier today. Pt able to verbally direct care for set up of supine to sit transfers form elevated surface using log roll technique with use of bed rails and quickly swinging LEs back into bed once seated for sit to supine transfer at EOS.     Transfers Overall transfer level: Needs assistance Equipment used: Rolling walker (2 wheeled) Transfers: Sit to/from Stand Sit to Stand: Supervision         General transfer comment: supervision for safety  Ambulation/Gait Ambulation/Gait assistance: Min guard;Supervision Gait Distance (Feet): 160 Feet Assistive device: Rolling walker (2 wheeled) Gait Pattern/deviations: Decreased stride length;Step-through pattern Gait velocity: decreased   General Gait Details: Pt ambulated an additional 9ft, MIN guard progressing to supervision for safety with no LOB observed, pt required x1 standing rest break due to fatigue.   Stairs             Wheelchair Mobility    Modified Rankin (Stroke Patients Only)       Balance Overall balance assessment: Needs assistance Sitting-balance support: No upper extremity supported;Feet supported Sitting balance-Leahy Scale: Fair     Standing balance support: Bilateral upper extremity supported;During functional activity;No upper extremity supported Standing balance-Leahy Scale: Fair Standing balance comment: pt able to maintain static standing balance without UE support                            Cognition Arousal/Alertness: Awake/alert Behavior During Therapy: WFL for tasks assessed/performed Overall Cognitive Status: Within Functional Limits for tasks assessed                                 General Comments: required increased encouragement from PT to participate in mobility this session, ultimately agreeable to ambulation      Exercises      General Comments        Pertinent Vitals/Pain Pain Assessment: Faces  Faces Pain Scale: Hurts a little bit Pain Location: bottom with bed mobility/sitting Pain Descriptors / Indicators: Grimacing;Sore Pain Intervention(s): Limited activity within patient's tolerance;Monitored during session;Repositioned    Home Living                       Prior Function            PT Goals (current goals can now be found in the care plan section) Acute Rehab PT Goals Patient Stated Goal: to be able to get back home. PT Goal Formulation: With patient Time For Goal Achievement: 03/14/21 Potential to Achieve Goals: Good Progress towards PT goals: Progressing toward goals    Frequency    Min 3X/week      PT Plan Current plan remains appropriate    Co-evaluation              AM-PAC PT "6 Clicks" Mobility   Outcome Measure  Help needed turning from your back to your side while in a flat bed without using bedrails?: A Little Help needed moving from lying on your back to sitting on the side of a flat bed without using bedrails?: A Little Help needed moving to and from a bed to a chair (including a wheelchair)?: A Little Help needed standing up from a chair using your arms (e.g., wheelchair or bedside chair)?: A Little Help needed to walk in hospital room?: A Little Help needed climbing 3-5 steps with a railing? : A Lot 6 Click Score: 17    End of Session Equipment Utilized During Treatment: Gait belt Activity Tolerance: Patient tolerated treatment well Patient left: in bed;with call bell/phone within reach;with family/visitor present Nurse Communication: Mobility status PT Visit Diagnosis: Other abnormalities of gait and mobility (R26.89)     Time: 1610-9604 PT Time Calculation (min) (ACUTE ONLY): 27 min  Charges:  $Therapeutic Activity: 23-37 mins                     Lyman Speller PT, DPT  Acute Rehabilitation Services  Office [phone]   03/05/2021, 4:31 PM

## 2021-03-05 NOTE — Progress Notes (Signed)
8 Days Post-Op    CC: Abdominal pain nausea and vomiting x4 days.  Subjective: Patient is in bed seems comfortable.  His ostomy is working well.  Wounds are stable.  He is still working on disposition and dressing changes after discharge.  Objective: Vital signs in last 24 hours: Temp:  [98.4 F (36.9 C)-98.9 F (37.2 C)] 98.8 F (37.1 C) (07/21 0324) Pulse Rate:  [81-88] 88 (07/21 0324) Resp:  [16-19] 19 (07/21 0324) BP: (133-149)/(69-82) 133/69 (07/21 0324) SpO2:  [97 %] 97 % (07/21 0324) Last BM Date: 03/04/21 720 p.o. recorded 1425 urine Stool 50 Afebrile vital signs are stable Glucose 232-112 yesterday/today No other labs since 03/03/2021 intake/Output from previous day: 07/20 0701 - 07/21 0700 In: 720 [P.O.:720] Out: 1475 [Urine:1425; Stool:50] Intake/Output this shift: Total I/O In: 354 [P.O.:354] Out: 400 [Urine:400]  Lab Results:  Recent Labs    03/03/21 0451  WBC 8.3  HGB 11.4*  HCT 35.1*  PLT 355    BMET Recent Labs    03/03/21 0451  NA 140  K 3.3*  CL 104  CO2 26  GLUCOSE 104*  BUN 13  CREATININE 1.05  CALCIUM 8.0*   PT/INR No results for input(s): LABPROT, INR in the last 72 hours.  No results for input(s): AST, ALT, ALKPHOS, BILITOT, PROT, ALBUMIN in the last 168 hours.   Lipase     Component Value Date/Time   LIPASE 193 (H) 05/10/2019 1946     Medications:  (feeding supplement) PROSource Plus  30 mL Oral BID BM   acetaminophen  1,000 mg Oral Q6H   vitamin C  500 mg Oral BID   Chlorhexidine Gluconate Cloth  6 each Topical Q0600   cyanocobalamin  1,000 mcg Intramuscular Daily   enoxaparin (LOVENOX) injection  40 mg Subcutaneous Q24H   feeding supplement  1 Container Oral BID BM   insulin aspart  0-15 Units Subcutaneous Q4H   insulin aspart  3 Units Subcutaneous TID WC   insulin detemir  12 Units Subcutaneous BID   mouth rinse  15 mL Mouth Rinse BID   methocarbamol  500 mg Oral TID   multivitamin with minerals  1 tablet Oral  Daily   nicotine  21 mg Transdermal Daily   nutrition supplement (JUVEN)  1 packet Oral BID BM   pantoprazole  40 mg Oral Daily   potassium chloride  20 mEq Oral Daily   zinc sulfate  220 mg Oral Daily    Assessment/Plan Fournier's gangrene POD 9 I&D perineum 15 x 10 x 8 cm, containing skin, subcutaneous tissue, fascia for Fournier's gangrene - Dr. Sheliah Hatch 7/12 POD 8 laparoscopic loop sigmoid colostomy with LOA - Dr. Sheliah Hatch - 7/13 - Cx w/ strep anginosis, Prevotella, beta lactamase +. Cont abx for 14 days total. Can transition to oral abx  - Tolerating diet and having ostomy output - Stoma rod removal  - WOC following for teaching. Referral placed to ostomy clinic - PT/OT, mobilize. Recommended for [facility] - written for - Wound care: Wound clean. BID WTD - Stable for d/c from our standpoint. However it appears he may not be eligible for [facility], reports he does not have anyone to help with dressing changes at home and does not feel he could change this himself. CM working on exploring what options. May need SNF? If cannot get wound care at home.  Disposition still pending.   FEN: CM, IVF per [facility] VTE: SCDs, LMWH  ID: Unasyn Foley: Voiding    -  below per primary - DKA/T2DM, poorly controlled HLD HTN PAD with claudication Tobacco abuse Obesity - BMI 31.27    LOS: 11 days    Andres Richardson 03/05/2021 Please see Amion

## 2021-03-06 LAB — GLUCOSE, CAPILLARY
Glucose-Capillary: 106 mg/dL — ABNORMAL HIGH (ref 70–99)
Glucose-Capillary: 127 mg/dL — ABNORMAL HIGH (ref 70–99)
Glucose-Capillary: 131 mg/dL — ABNORMAL HIGH (ref 70–99)
Glucose-Capillary: 170 mg/dL — ABNORMAL HIGH (ref 70–99)
Glucose-Capillary: 174 mg/dL — ABNORMAL HIGH (ref 70–99)
Glucose-Capillary: 208 mg/dL — ABNORMAL HIGH (ref 70–99)

## 2021-03-06 MED ORDER — AMOXICILLIN-POT CLAVULANATE 875-125 MG PO TABS
1.0000 | ORAL_TABLET | Freq: Two times a day (BID) | ORAL | Status: DC
  Administered 2021-03-06 – 2021-03-10 (×9): 1 via ORAL
  Filled 2021-03-06 (×9): qty 1

## 2021-03-06 NOTE — Progress Notes (Signed)
9 Days Post-Op    CC: Abdominal pain, nausea and vomiting, x4 days.  Subjective: No real change this AM.  Still awaiting disposition.  Wound changes at 5 AM.  Ostomy working well tolerating diet well.  Objective: Vital signs in last 24 hours: Temp:  [98.2 F (36.8 C)-98.4 F (36.9 C)] 98.4 F (36.9 C) (07/22 0402) Pulse Rate:  [86-96] 86 (07/22 0402) Resp:  [18-20] 20 (07/22 0402) BP: (121-150)/(67-78) 121/67 (07/22 0402) SpO2:  [97 %-98 %] 97 % (07/22 0402) Last BM Date: 03/04/21 Afebrile vital signs are stable Glucose control improved 210-106 range yesterday   Intake/Output from previous day: 07/21 0701 - 07/22 0700 In: 944 [P.O.:944] Out: 1200 [Urine:1200] Intake/Output this shift: No intake/output data recorded.  General appearance: alert, cooperative, and no distress Resp: clear to auscultation bilaterally GI: Ostomy working well.  Lab Results:  No results for input(s): WBC, HGB, HCT, PLT in the last 72 hours.  BMET No results for input(s): NA, K, CL, CO2, GLUCOSE, BUN, CREATININE, CALCIUM in the last 72 hours. PT/INR No results for input(s): LABPROT, INR in the last 72 hours.  No results for input(s): AST, ALT, ALKPHOS, BILITOT, PROT, ALBUMIN in the last 168 hours.   Lipase     Component Value Date/Time   LIPASE 193 (H) 05/10/2019 1946     Medications:  acetaminophen  1,000 mg Oral Q6H   vitamin C  500 mg Oral BID   Chlorhexidine Gluconate Cloth  6 each Topical Q0600   cyanocobalamin  1,000 mcg Intramuscular Daily   enoxaparin (LOVENOX) injection  40 mg Subcutaneous Q24H   insulin aspart  0-15 Units Subcutaneous Q4H   insulin aspart  3 Units Subcutaneous TID WC   insulin detemir  12 Units Subcutaneous BID   mouth rinse  15 mL Mouth Rinse BID   methocarbamol  500 mg Oral TID   multivitamin with minerals  1 tablet Oral Daily   nicotine  21 mg Transdermal Daily   nutrition supplement (JUVEN)  1 packet Oral BID BM   pantoprazole  40 mg Oral Daily    potassium chloride  20 mEq Oral Daily   Ensure Max Protein  11 oz Oral BID   zinc sulfate  220 mg Oral Daily    Assessment/Plan Fournier's gangrene POD 10 I&D perineum 15 x 10 x 8 cm, containing skin, subcutaneous tissue, fascia for Fournier's gangrene - Dr. Sheliah Hatch 7/12 POD 9 laparoscopic loop sigmoid colostomy with LOA - Dr. Sheliah Hatch - 7/13 - Cx w/ strep anginosis, Prevotella, beta lactamase +. Cont abx for 14 days total. Can transition to oral abx  - Tolerating diet and having ostomy output - Stoma rod removal  - WOC following for teaching. Referral placed to ostomy clinic - PT/OT, mobilize. Recommended for [facility] - written for - Wound care: Wound clean. BID WTD - Stable for d/c from our standpoint. However it appears he may not be eligible for [facility], reports he does not have anyone to help with dressing changes at home and does not feel he could change this himself. CM working on exploring what options. May need SNF? If cannot get wound care at home.  Disposition still pending.   FEN: CM, IVF per [facility] VTE: SCDs, LMWH  ID: Unasyn Foley: Voiding    - below per primary - DKA/T2DM, poorly controlled HLD HTN PAD with claudication Tobacco abuse Obesity - BMI 31.27      LOS: 12 days    Andres Richardson 03/06/2021 Please see Amion

## 2021-03-06 NOTE — Progress Notes (Signed)
PROGRESS NOTE    Andres Richardson  FFM:[id number] DOB: 1965-08-30 DOA: 02/22/2021 PCP: [name], MD   No chief complaint on file.  Brief Narrative: 55 year old male with diabetes to with diabetes admitted with intractable nausea vomiting, DKA managed with insulin drip on 7/10.  Patient however demonstrated ongoing fever and vomiting CT abdomen showed perirectal abscess for fournier gangrene surgery, urology was consulted. Patient is status post debridement by general surgery and urology 7/12- "laparoscopic creation of loop sigmoid colostomy, laparoscopic lysis of adhesions, perineal exam under anesthesia" Patient colostomy started have some bowel activity 7/15.  Diet was advanced.  Antibiotic changed to Unasyn as were culture growing strep. Patient transferred to MedSurg 7/15 Clinically improving having colostomy output tolerating regular diet Antibiotic has been changed to oral.  At this time is a stable for discharge awaiting disposition as unable to arrange home health to change his dressing at home.  Subjective: Resting well no complaint.  Dressing changed at 5 AM today.  Tolerating diet and having ostomy output  Assessment & Plan:  Fournier's gangrene: Seen by surgery and urology.S/P debridement 7/12 and loop sigmoid colostomy, LOS by gen surgery 7/13.  Overall afebrile  OR culture 7/12-w/ strep anginosis, no anerobes-ON  Unasyn> changed to Augmentin today, complete 14 days course. Continue wound dressing as per surgery currently on twice daily.  Looking for home health to arrange for wound care if not may need SNF-awaiting for disposition..    DKA-resolved Type 2 diabetes mellitus-poorly controlled w/ hba1c 9.5.On glipizide metformin  and insulin.blood sugar controlled continue current Levemir but at home he will use NovoLog 70/30 10 units twice daily, and metformin. Advised to hold off on glipizide and follow-up with PCP to adjust meds. Recent Labs  Lab 03/05/21 1718 03/05/21 1933  03/05/21 2337 03/06/21 0358 03/06/21 0731  GLUCAP 196* 210* 172* 208* 106*   TTS:[id number] Hypokalemia: Stable Hypernatremia resolved  Hypertension:stable.  Not on meds.  Tobacco abuse continue nicotine patch.  Cessation advise.  Patient  Morbid obesity BMI  31-He will benefit with weight loss healthy lifestyle.  Dietitian consulted.  Diet Order             Diet Carb Modified Fluid consistency: Thin; Room service appropriate? Yes  Diet effective now                   Patient's Body mass index is 31.27 kg/m. DVT prophylaxis: enoxaparin (LOVENOX) injection 40 mg Start: 02/26/21 1000SCD. Lovenox once okay w Surgery Code Status:   Code Status: DNR  Family Communication: plan of care discussed with patient at bedside.  Status is: Inpatient Remains inpatient appropriate because:IV treatments appropriate due to intensity of illness or inability to take PO and Inpatient level of care appropriate due to severity of illness Dispo: The patient is from: Home.              Anticipated d/c is to: HHPT               Patient currently is medically stable for discharge once home health set up for wound care.   Difficult to place patient yes Unresulted Labs (From admission, onward)     Start     Ordered   02/24/21 0909  Acid Fast Culture with reflexed sensitivities  RELEASE UPON ORDERING,   TIMED       Comments: Specimen A: Pre-op diagnosis: fournier gangrene    02/24/21 0909            Medications reviewed:  Scheduled Meds:  acetaminophen  1,000 mg Oral Q6H   amoxicillin-clavulanate  1 tablet Oral Q12H   vitamin C  500 mg Oral BID   Chlorhexidine Gluconate Cloth  6 each Topical Q0600   cyanocobalamin  1,000 mcg Intramuscular Daily   enoxaparin (LOVENOX) injection  40 mg Subcutaneous Q24H   insulin aspart  0-15 Units Subcutaneous Q4H   insulin aspart  3 Units Subcutaneous TID WC   insulin detemir  12 Units Subcutaneous BID   mouth rinse  15 mL Mouth Rinse BID    methocarbamol  500 mg Oral TID   multivitamin with minerals  1 tablet Oral Daily   nicotine  21 mg Transdermal Daily   nutrition supplement (JUVEN)  1 packet Oral BID BM   pantoprazole  40 mg Oral Daily   potassium chloride  20 mEq Oral Daily   Ensure Max Protein  11 oz Oral BID   zinc sulfate  220 mg Oral Daily   Continuous Infusions:  promethazine (PHENERGAN) injection (IM or IVPB) Stopped (02/25/21 0444)    Consultants:see note  Procedures:see note  Antimicrobials: Anti-infectives (From admission, onward)    Start     Dose/Rate Route Frequency Ordered Stop   03/06/21 1400  amoxicillin-clavulanate (AUGMENTIN) 875-125 MG per tablet 1 tablet        1 tablet Oral Every 12 hours 03/06/21 0836 03/11/21 0959   02/27/21 2000  Ampicillin-Sulbactam (UNASYN) 3 g in sodium chloride 0.9 % 100 mL IVPB  Status:  Discontinued        3 g 200 mL/hr over 30 Minutes Intravenous Every 6 hours 02/27/21 1313 03/06/21 0836   02/25/21 2200  vancomycin (VANCOREADY) IVPB 750 mg/150 mL  Status:  Discontinued        750 mg 150 mL/hr over 60 Minutes Intravenous Every 12 hours 02/25/21 1349 02/27/21 1313   02/24/21 1000  vancomycin (VANCOREADY) IVPB 1000 mg/200 mL  Status:  Discontinued        1,000 mg 200 mL/hr over 60 Minutes Intravenous Every 12 hours 02/23/21 2215 02/25/21 1349   02/23/21 2215  clindamycin (CLEOCIN) IVPB 600 mg        600 mg 100 mL/hr over 30 Minutes Intravenous Every 8 hours 02/23/21 2122 02/26/21 2209   02/23/21 2200  vancomycin (VANCOREADY) IVPB 2000 mg/400 mL        2,000 mg 200 mL/hr over 120 Minutes Intravenous  Once 02/23/21 2133 02/23/21 2353   02/23/21 2200  piperacillin-tazobactam (ZOSYN) IVPB 3.375 g  Status:  Discontinued        3.375 g 12.5 mL/hr over 4 Hours Intravenous Every 8 hours 02/23/21 2133 02/27/21 1313      Culture/Microbiology    Component Value Date/Time   SDES  02/24/2021 0973    ABSCESS PERINEUM Performed at [facility], [address] [place], [place] [address]    SPECREQUEST  02/24/2021 2426    NONE Performed at [facility], [address], [place], [place] [address]    CULT  02/24/2021 [phone]    FEW STREPTOCOCCUS ANGINOSIS FEW PREVOTELLA SPECIES BETA LACTAMASE POSITIVE Performed at [facility], [address], [place], [place] [address]    REPTSTATUS 03/03/2021 FINAL 02/24/2021 2119    Other culture-see note  Objective: Vitals: Today's Vitals   03/06/21 0300 03/06/21 0402 03/06/21 0409 03/06/21 0454  BP:  121/67    Pulse:  86    Resp:  20    Temp:  98.4 F (36.9  C)    TempSrc:  Oral    SpO2:  97%    Weight:      Height:      PainSc: Asleep  6  2     Intake/Output Summary (Last 24 hours) at 03/06/2021 1034 Last data filed at 03/06/2021 0900 Gross per 24 hour  Intake 710 ml  Output 800 ml  Net -90 ml    Filed Weights   02/22/21 1509 02/23/21 0400 02/24/21 0751  Weight: 101.1 kg 101.7 kg 101.7 kg   Weight change:   Intake/Output from previous day: 07/21 0701 - 07/22 0700 In: 944 [P.O.:944] Out: 1200 [Urine:1200] Intake/Output this shift: Total I/O In: 120 [P.O.:120] Out: -  Filed Weights   02/22/21 1509 02/23/21 0400 02/24/21 0751  Weight: 101.1 kg 101.7 kg 101.7 kg    Examination: General exam: AAOx 3,older than stated age, weak appearing. HEENT:Oral mucosa moist, Ear/Nose WNL grossly, dentition normal. Respiratory system: bilaterally diminished, , no use of accessory muscle Cardiovascular system: S1 & S2 +, No JVD,. Gastrointestinal system: Abdomen soft, NT,ND, BS+, colostomy present with stool output Nervous System:Alert, awake, moving extremities and grossly nonfocal Extremities: no edema, distal peripheral pulses palpable.  Skin: No rashes,no icterus. MSK: Normal muscle bulk,tone, power   Data Reviewed: I have personally reviewed following labs and imaging studies CBC: Recent Labs  Lab 02/28/21 0531 03/01/21 0556 03/02/21 0500  03/03/21 0451  WBC 12.5* 10.4 9.8 8.3  HGB 10.5* 9.6* 10.0* 11.4*  HCT 33.1* 29.4* 30.8* 35.1*  MCV 91.7 90.2 89.3 89.1  PLT 291 307 353 355    Basic Metabolic Panel: Recent Labs  Lab 02/28/21 0531 03/01/21 0556 03/02/21 0500 03/03/21 0451  NA 141 141 141 140  K 3.5 3.1* 3.5 3.3*  CL 106 105 103 104  CO2 26 26 27 26   GLUCOSE 161* 140* 151* 104*  BUN 17 12 14 13   CREATININE 1.05 0.92 1.08 1.05  CALCIUM 7.5* 7.5* 7.9* 8.0*    GFR: Estimated Creatinine Clearance: 96.6 mL/min (by C-G formula based on SCr of 1.05 mg/dL). Liver Function Tests: No results for input(s): AST, ALT, ALKPHOS, BILITOT, PROT, ALBUMIN in the last 168 hours.  No results for input(s): LIPASE, AMYLASE in the last 168 hours. No results for input(s): AMMONIA in the last 168 hours. Coagulation Profile: No results for input(s): INR, PROTIME in the last 168 hours. Cardiac Enzymes: No results for input(s): CKTOTAL, CKMB, CKMBINDEX, TROPONINI in the last 168 hours. BNP (last 3 results) No results for input(s): PROBNP in the last 8760 hours. HbA1C: No results for input(s): HGBA1C in the last 72 hours.  CBG: Recent Labs  Lab 03/05/21 1718 03/05/21 1933 03/05/21 2337 03/06/21 0358 03/06/21 0731  GLUCAP 196* 210* 172* 208* 106*    Lipid Profile: No results for input(s): CHOL, HDL, LDLCALC, TRIG, CHOLHDL, LDLDIRECT in the last 72 hours. Thyroid Function Tests: No results for input(s): TSH, T4TOTAL, FREET4, T3FREE, THYROIDAB in the last 72 hours. Anemia Panel: No results for input(s): VITAMINB12, FOLATE, FERRITIN, TIBC, IRON, RETICCTPCT in the last 72 hours.  Sepsis Labs: No results for input(s): PROCALCITON, LATICACIDVEN in the last 168 hours.  No results found for this or any previous visit (from the past 240 hour(s)).     Radiology Studies: No results found.   LOS: 12 days   03/08/21, MD [facility]  03/06/2021, 10:34 AM

## 2021-03-06 NOTE — Consult Note (Signed)
WOC Nurse ostomy follow up Stoma type/location: LLQ loop colostomy Stomal assessment/size: oval in a crease, 1 and 1/4 inch x 2 inches, red, skin level and friable mucosa Peristomal assessment: intact Treatment options for stomal/peristomal skin: skin barrier ring and easily compressible convex pouch Output : tan stool, liquid to very soft Ostomy pouching: 1pc.easily compressible convex plus skin barrier ring  Education provided: Patient is lamenting over "push to get me to leave". Discussed some of his barriers to caregiver search. Reports that he instructed bedside RN last night on emptying, he is encouraged to empty his own pouch from this point forward and gain independence in this skill. Enrolled patient in [facility] [employer] Discharge program: Yes, previously  WOC nursing team will follow, and will remain available to this patient, the nursing and medical teams. Next scheduled visit is on Monday, 7/25.  Supplies in room. Thanks, Ladona Mow, MSN, RN, GNP, [facility]  Pager# [phone]

## 2021-03-06 NOTE — Progress Notes (Signed)
Physical Therapy Treatment Patient Details Name: MOE GRACA MRN: [id number] DOB: 11/24/1965 Today's Date: 03/06/2021    History of Present Illness 55 year old male admitted with intractable nausea vomiting, DKA managed with insulin drip on 7/10.  Patient however demonstrated ongoing fever and vomiting CT abdomen showed perirectal abscess for fournier gangrene surgery, urology was consulted.  Patient is status post debridement by general surgery and urology 7/12- "laparoscopic creation of loop sigmoid colostomy, laparoscopic lysis of adhesions, perineal exam under anesthesia"    PT Comments    Pt is progressing toward acute PT goals with supervision for safety with bed mobility and ambulation this session. Introduced standing LE there ex, pt unable to tolerate AROM exercises on R LE due to pain from perianal wounds. Pt with 15s SLS on R LE with single UE support and 5s on L LE requiring B UE support, limited by pain. Pt will benefit from continued skilled PT in order to address listed impairments and maximize functional mobility and activity tolerance.     Follow Up Recommendations  Home health PT     Equipment Recommendations  None recommended by PT    Recommendations for Other Services       Precautions / Restrictions Precautions Precautions: Other (comment) Precaution Comments: caution with perianal wounds with mobility Restrictions Weight Bearing Restrictions: No    Mobility  Bed Mobility Overal bed mobility: Needs Assistance Bed Mobility: Rolling;Sidelying to Sit;Sit to Sidelying Rolling: Supervision Sidelying to sit: Supervision     Sit to sidelying: Supervision General bed mobility comments: pt with use of compensatory strategies for bed mobility. Pt transfers form elevated surface using log roll technique with use of bed rails and quickly swinging LEs back into bed once seated for sit to supine transfer at EOS.    Transfers Overall transfer level: Needs  assistance Equipment used: Rolling walker (2 wheeled) Transfers: Sit to/from Stand Sit to Stand: Supervision;From elevated surface         General transfer comment: supervision for safety. Pt reported feeling dizzy upon which improved with prolonged standing.  Ambulation/Gait Ambulation/Gait assistance: Supervision Gait Distance (Feet): 150 Feet Assistive device: Rolling walker (2 wheeled) Gait Pattern/deviations: Decreased stride length;Step-through pattern;Antalgic Gait velocity: decreased   General Gait Details: Pt ambulated an additional 23ft following x1 standing rest break due to fatigue. supervision provided for safety with no LOB observed. Pt noted with slight decreased in step length and very mild limp on R LE, pt reported increased pain while ambulating.   Stairs             Wheelchair Mobility    Modified Rankin (Stroke Patients Only)       Balance Overall balance assessment: Needs assistance Sitting-balance support: No upper extremity supported;Feet supported Sitting balance-Leahy Scale: Fair     Standing balance support: Bilateral upper extremity supported;During functional activity;No upper extremity supported Standing balance-Leahy Scale: Fair Standing balance comment: pt able to maintain static standing balance without UE support Single Leg Stance - Right Leg: 15 (limited by pain, single UE support) Single Leg Stance - Left Leg: 5 (limited by pain, required B UE support)                        Cognition Arousal/Alertness: Awake/alert Behavior During Therapy: WFL for tasks assessed/performed Overall Cognitive Status: Within Functional Limits for tasks assessed  Exercises General Exercises - Lower Extremity Hip ABduction/ADduction: AROM;Left;10 reps;Standing (B UE support) Hip Flexion/Marching: AROM;Both;10 reps;Standing (single UE support on counter) Other Exercises Other  Exercises: Standing L hip extension x5, single UE support on counter Other Exercises: Standing knee flexion x 10 on L    General Comments        Pertinent Vitals/Pain Pain Assessment: Faces Faces Pain Scale: Hurts even more Pain Location: bottom with bed mobility/sitting Pain Descriptors / Indicators: Grimacing;Sore;Tender Pain Intervention(s): Limited activity within patient's tolerance;Monitored during session;Repositioned;Premedicated before session    Home Living                      Prior Function            PT Goals (current goals can now be found in the care plan section) Acute Rehab PT Goals Patient Stated Goal: to be able to get back home. PT Goal Formulation: With patient Time For Goal Achievement: 03/14/21 Potential to Achieve Goals: Good Progress towards PT goals: Progressing toward goals    Frequency    Min 3X/week      PT Plan Current plan remains appropriate    Co-evaluation              AM-PAC PT "6 Clicks" Mobility   Outcome Measure  Help needed turning from your back to your side while in a flat bed without using bedrails?: A Little Help needed moving from lying on your back to sitting on the side of a flat bed without using bedrails?: A Little Help needed moving to and from a bed to a chair (including a wheelchair)?: A Little Help needed standing up from a chair using your arms (e.g., wheelchair or bedside chair)?: A Little Help needed to walk in hospital room?: A Little Help needed climbing 3-5 steps with a railing? : A Lot 6 Click Score: 17    End of Session Equipment Utilized During Treatment: Gait belt Activity Tolerance: Patient tolerated treatment well;Patient limited by pain Patient left: in bed;with call bell/phone within reach;with family/visitor present Nurse Communication: Mobility status PT Visit Diagnosis: Other abnormalities of gait and mobility (R26.89)     Time: 1341-1400 PT Time Calculation (min) (ACUTE  ONLY): 19 min  Charges:  $Therapeutic Activity: 8-22 mins                     Lyman Speller PT, DPT  Acute Rehabilitation Services  Office [phone]     03/06/2021, 2:17 PM

## 2021-03-07 LAB — GLUCOSE, CAPILLARY
Glucose-Capillary: 103 mg/dL — ABNORMAL HIGH (ref 70–99)
Glucose-Capillary: 125 mg/dL — ABNORMAL HIGH (ref 70–99)
Glucose-Capillary: 153 mg/dL — ABNORMAL HIGH (ref 70–99)
Glucose-Capillary: 165 mg/dL — ABNORMAL HIGH (ref 70–99)
Glucose-Capillary: 200 mg/dL — ABNORMAL HIGH (ref 70–99)
Glucose-Capillary: 206 mg/dL — ABNORMAL HIGH (ref 70–99)
Glucose-Capillary: 217 mg/dL — ABNORMAL HIGH (ref 70–99)

## 2021-03-07 MED ORDER — INSULIN DETEMIR 100 UNIT/ML ~~LOC~~ SOLN
15.0000 [IU] | Freq: Two times a day (BID) | SUBCUTANEOUS | Status: DC
  Administered 2021-03-07: 15 [IU] via SUBCUTANEOUS
  Filled 2021-03-07 (×2): qty 0.15

## 2021-03-07 MED ORDER — INSULIN ASPART PROT & ASPART (70-30 MIX) 100 UNIT/ML ~~LOC~~ SUSP
15.0000 [IU] | Freq: Two times a day (BID) | SUBCUTANEOUS | Status: DC
  Administered 2021-03-08 – 2021-03-10 (×5): 15 [IU] via SUBCUTANEOUS
  Filled 2021-03-07: qty 10

## 2021-03-07 NOTE — Progress Notes (Addendum)
PROGRESS NOTE    Andres Richardson  SWN:[id number] DOB: [birth year] DOA: 02/22/2021 PCP: [name], MD   No chief complaint on file.  Brief Narrative: 55 year old male with diabetes to with diabetes admitted with intractable nausea vomiting, DKA managed with insulin drip on 7/10.  Patient however demonstrated ongoing fever and vomiting CT abdomen showed perirectal abscess concerning for fournier gangrene - Surgery, Urology were consulted. Patient is status post debridement by general surgery and urology 7/12- "laparoscopic creation of loop sigmoid colostomy, laparoscopic lysis of adhesions, perineal exam under anesthesia" Patient colostomy started have some bowel activity 7/15.  Diet was advanced.  Antibiotic changed to Unasyn as culture growing strep. Patient transferred to MedSurg 7/15 At this time is a stable for discharge awaiting disposition as unable to arrange home health to change his dressing at home.  Subjective: Resting well, no complaints.  Dressing changed.  Tolerating diet and having ostomy output. AM sugar 200. Patient reports his dressing change is only happening once daily.  Assessment & Plan:  Fournier's gangrene:  Seen by surgery and urology. S/P debridement 7/12 and loop sigmoid colostomy by gen surgery 7/13.   - OR culture 7/12-w/ strep anginosis - Unasyn> changed to Augmentin today, complete 14 days course.  - Continue wound dressing as per surgery currently on twice daily.  Looking for home health to arrange for wound care if not may need SNF-awaiting disposition; patient working on finding someone to help  DKA-resolved Type 2 diabetes mellitus-poorly controlled  Hba1c 9.5 on admission - Home regimen: glipizide, metformin, he takes 70/30 at home as determir too expensive - Will transition to 70/30 to find stable dose for patient in prep for discharge given previously poorly controlled BGs - can resume oral antihyperglycemics on discharge  Recent Labs  Lab  03/06/21 2348 03/07/21 0359 03/07/21 0800 03/07/21 1135 03/07/21 1650  GLUCAP 127* 206* 200* 217* 103*   XFG:[id number] Hypokalemia: Stable Hypernatremia resolved Hypertension:stable.  Not on meds. Tobacco abuse continue nicotine patch.  Cessation advised.  Obesity BMI  31-He will benefit with weight loss healthy lifestyle.  Dietitian consulted.  Diet Order             Diet Carb Modified Fluid consistency: Thin; Room service appropriate? Yes  Diet effective now                   Patient's Body mass index is 31.27 kg/m. DVT prophylaxis: enoxaparin (LOVENOX) injection 40 mg Start: 02/26/21 1000SCD. Lovenox once okay w Surgery Code Status:   Code Status: DNR  Family Communication: plan of care discussed with patient at bedside.  Status is: Inpatient Remains inpatient appropriate because:IV treatments appropriate due to intensity of illness or inability to take PO and Inpatient level of care appropriate due to severity of illness Dispo: The patient is from: Home.              Anticipated d/c is to: HHPT               Patient currently is medically stable for discharge once home health set up for wound care.   Difficult to place patient yes Unresulted Labs (From admission, onward)     Start     Ordered   02/24/21 0909  Acid Fast Culture with reflexed sensitivities  RELEASE UPON ORDERING,   TIMED       Comments: Specimen A: Pre-op diagnosis: fournier gangrene    02/24/21 0909            Medications  reviewed:  Scheduled Meds:  acetaminophen  1,000 mg Oral Q6H   amoxicillin-clavulanate  1 tablet Oral Q12H   vitamin C  500 mg Oral BID   Chlorhexidine Gluconate Cloth  6 each Topical Q0600   cyanocobalamin  1,000 mcg Intramuscular Daily   enoxaparin (LOVENOX) injection  40 mg Subcutaneous Q24H   insulin aspart  0-15 Units Subcutaneous Q4H   insulin aspart  3 Units Subcutaneous TID WC   insulin detemir  15 Units Subcutaneous BID   mouth rinse  15 mL Mouth Rinse BID    methocarbamol  500 mg Oral TID   multivitamin with minerals  1 tablet Oral Daily   nicotine  21 mg Transdermal Daily   nutrition supplement (JUVEN)  1 packet Oral BID BM   pantoprazole  40 mg Oral Daily   potassium chloride  20 mEq Oral Daily   Ensure Max Protein  11 oz Oral BID   zinc sulfate  220 mg Oral Daily   Continuous Infusions:  promethazine (PHENERGAN) injection (IM or IVPB) Stopped (02/25/21 0444)    Consultants:see note  Procedures:see note  Antimicrobials: Anti-infectives (From admission, onward)    Start     Dose/Rate Route Frequency Ordered Stop   03/06/21 1400  amoxicillin-clavulanate (AUGMENTIN) 875-125 MG per tablet 1 tablet        1 tablet Oral Every 12 hours 03/06/21 0836 03/11/21 0959   02/27/21 2000  Ampicillin-Sulbactam (UNASYN) 3 g in sodium chloride 0.9 % 100 mL IVPB  Status:  Discontinued        3 g 200 mL/hr over 30 Minutes Intravenous Every 6 hours 02/27/21 1313 03/06/21 0836   02/25/21 2200  vancomycin (VANCOREADY) IVPB 750 mg/150 mL  Status:  Discontinued        750 mg 150 mL/hr over 60 Minutes Intravenous Every 12 hours 02/25/21 1349 02/27/21 1313   02/24/21 1000  vancomycin (VANCOREADY) IVPB 1000 mg/200 mL  Status:  Discontinued        1,000 mg 200 mL/hr over 60 Minutes Intravenous Every 12 hours 02/23/21 2215 02/25/21 1349   02/23/21 2215  clindamycin (CLEOCIN) IVPB 600 mg        600 mg 100 mL/hr over 30 Minutes Intravenous Every 8 hours 02/23/21 2122 02/26/21 2209   02/23/21 2200  vancomycin (VANCOREADY) IVPB 2000 mg/400 mL        2,000 mg 200 mL/hr over 120 Minutes Intravenous  Once 02/23/21 2133 02/23/21 2353   02/23/21 2200  piperacillin-tazobactam (ZOSYN) IVPB 3.375 g  Status:  Discontinued        3.375 g 12.5 mL/hr over 4 Hours Intravenous Every 8 hours 02/23/21 2133 02/27/21 1313      Culture/Microbiology    Component Value Date/Time   SDES  02/24/2021 9937    ABSCESS PERINEUM Performed at [facility], [address], [place], [place] [address]    SPECREQUEST  02/24/2021 8938    NONE Performed at [facility], [address], [place], [place] [address]    CULT  02/24/2021 [phone]    FEW STREPTOCOCCUS ANGINOSIS FEW PREVOTELLA SPECIES BETA LACTAMASE POSITIVE Performed at [facility], [address], [place], [place] [address]    REPTSTATUS 03/03/2021 FINAL 02/24/2021 8242    Other culture-see note  Objective: Vitals: Today's Vitals   03/07/21 0356 03/07/21 1141 03/07/21 1504 03/07/21 1607  BP: (!) 147/75  127/75   Pulse: 88  88   Resp: 20  18   Temp: 98.6  F (37 C)  98.9 F (37.2 C)   TempSrc: Oral  Oral   SpO2: 98%  98%   Weight:      Height:      PainSc:  0-No pain  (P) 0-No pain    Intake/Output Summary (Last 24 hours) at 03/07/2021 1653 Last data filed at 03/07/2021 0900 Gross per 24 hour  Intake 440 ml  Output 1300 ml  Net -860 ml    Filed Weights   02/22/21 1509 02/23/21 0400 02/24/21 0751  Weight: 101.1 kg 101.7 kg 101.7 kg   Weight change:   Intake/Output from previous day: 07/22 0701 - 07/23 0700 In: 600 [P.O.:600] Out: 1800 [Urine:1800] Intake/Output this shift: Total I/O In: 200 [P.O.:200] Out: -  Filed Weights   02/22/21 1509 02/23/21 0400 02/24/21 0751  Weight: 101.1 kg 101.7 kg 101.7 kg    Examination: General exam: AAOx 3,older than stated age  HEENT:Oral mucosa moist, Ear/Nose WNL grossly, dentition normal. Respiratory system: CTAB, NL WOB Cardiovascular system: S1 & S2 +, No JVD Gastrointestinal system: Abdomen soft, NT,ND, BS+, colostomy present with stool output GU: rectal wound with gauze dressing, good granulation tissue Nervous System:Alert, awake, moving extremities and grossly nonfocal Extremities: no edema, distal peripheral pulses palpable.  Skin: No rashes, no jaundice. MSK: Normal muscle bulk,tone, power   Data Reviewed: I have personally reviewed following labs and imaging studies CBC: Recent Labs   Lab 03/01/21 0556 03/02/21 0500 03/03/21 0451  WBC 10.4 9.8 8.3  HGB 9.6* 10.0* 11.4*  HCT 29.4* 30.8* 35.1*  MCV 90.2 89.3 89.1  PLT 307 353 355    Basic Metabolic Panel: Recent Labs  Lab 03/01/21 0556 03/02/21 0500 03/03/21 0451  NA 141 141 140  K 3.1* 3.5 3.3*  CL 105 103 104  CO2 26 27 26   GLUCOSE 140* 151* 104*  BUN 12 14 13   CREATININE 0.92 1.08 1.05  CALCIUM 7.5* 7.9* 8.0*    GFR: Estimated Creatinine Clearance: 96.6 mL/min (by C-G formula based on SCr of 1.05 mg/dL). Liver Function Tests: No results for input(s): AST, ALT, ALKPHOS, BILITOT, PROT, ALBUMIN in the last 168 hours.  No results for input(s): LIPASE, AMYLASE in the last 168 hours. No results for input(s): AMMONIA in the last 168 hours. Coagulation Profile: No results for input(s): INR, PROTIME in the last 168 hours. Cardiac Enzymes: No results for input(s): CKTOTAL, CKMB, CKMBINDEX, TROPONINI in the last 168 hours. BNP (last 3 results) No results for input(s): PROBNP in the last 8760 hours. HbA1C: No results for input(s): HGBA1C in the last 72 hours.  CBG: Recent Labs  Lab 03/06/21 2348 03/07/21 0359 03/07/21 0800 03/07/21 1135 03/07/21 1650  GLUCAP 127* 206* 200* 217* 103*    Lipid Profile: No results for input(s): CHOL, HDL, LDLCALC, TRIG, CHOLHDL, LDLDIRECT in the last 72 hours. Thyroid Function Tests: No results for input(s): TSH, T4TOTAL, FREET4, T3FREE, THYROIDAB in the last 72 hours. Anemia Panel: No results for input(s): VITAMINB12, FOLATE, FERRITIN, TIBC, IRON, RETICCTPCT in the last 72 hours.  Sepsis Labs: No results for input(s): PROCALCITON, LATICACIDVEN in the last 168 hours.  No results found for this or any previous visit (from the past 240 hour(s)).    Radiology Studies: No results found.   LOS: 13 days   03/09/21, MD MPH [facility]  03/07/2021, 4:53 PM

## 2021-03-07 NOTE — Progress Notes (Signed)
10 Days Post-Op   Subjective/Chief Complaint: Comfortable No acute issues over night   Objective: Vital signs in last 24 hours: Temp:  [98.6 F (37 C)-99.5 F (37.5 C)] 98.6 F (37 C) (07/23 0356) Pulse Rate:  [88-91] 88 (07/23 0356) Resp:  [18-20] 20 (07/23 0356) BP: (104-147)/(69-75) 147/75 (07/23 0356) SpO2:  [95 %-98 %] 98 % (07/23 0356) Last BM Date: 03/06/21  Intake/Output from previous day: 07/22 0701 - 07/23 0700 In: 600 [P.O.:600] Out: 1800 [Urine:1800] Intake/Output this shift: No intake/output data recorded.  Exam: Comfortable Dressings intact Ostomy viable  Lab Results:  No results for input(s): WBC, HGB, HCT, PLT in the last 72 hours. BMET No results for input(s): NA, K, CL, CO2, GLUCOSE, BUN, CREATININE, CALCIUM in the last 72 hours. PT/INR No results for input(s): LABPROT, INR in the last 72 hours. ABG No results for input(s): PHART, HCO3 in the last 72 hours.  Invalid input(s): PCO2, PO2  Studies/Results: No results found.  Anti-infectives: Anti-infectives (From admission, onward)    Start     Dose/Rate Route Frequency Ordered Stop   03/06/21 1400  amoxicillin-clavulanate (AUGMENTIN) 875-125 MG per tablet 1 tablet        1 tablet Oral Every 12 hours 03/06/21 0836 03/11/21 0959   02/27/21 2000  Ampicillin-Sulbactam (UNASYN) 3 g in sodium chloride 0.9 % 100 mL IVPB  Status:  Discontinued        3 g 200 mL/hr over 30 Minutes Intravenous Every 6 hours 02/27/21 1313 03/06/21 0836   02/25/21 2200  vancomycin (VANCOREADY) IVPB 750 mg/150 mL  Status:  Discontinued        750 mg 150 mL/hr over 60 Minutes Intravenous Every 12 hours 02/25/21 1349 02/27/21 1313   02/24/21 1000  vancomycin (VANCOREADY) IVPB 1000 mg/200 mL  Status:  Discontinued        1,000 mg 200 mL/hr over 60 Minutes Intravenous Every 12 hours 02/23/21 2215 02/25/21 1349   02/23/21 2215  clindamycin (CLEOCIN) IVPB 600 mg        600 mg 100 mL/hr over 30 Minutes Intravenous Every 8  hours 02/23/21 2122 02/26/21 2209   02/23/21 2200  vancomycin (VANCOREADY) IVPB 2000 mg/400 mL        2,000 mg 200 mL/hr over 120 Minutes Intravenous  Once 02/23/21 2133 02/23/21 2353   02/23/21 2200  piperacillin-tazobactam (ZOSYN) IVPB 3.375 g  Status:  Discontinued        3.375 g 12.5 mL/hr over 4 Hours Intravenous Every 8 hours 02/23/21 2133 02/27/21 1313       Assessment/Plan: Fournier's gangrene POD 10 I&D perineum 15 x 10 x 8 cm, containing skin, subcutaneous tissue, fascia for Fournier's gangrene - Dr. Sheliah Hatch 7/12 POD 9 laparoscopic loop sigmoid colostomy with LOA - Dr. Sheliah Hatch - 7/13  Continue current wound care HH vs SNF Surgery will see again on Monday unless there are problems   Abigail Miyamoto MD 03/07/2021

## 2021-03-08 LAB — BASIC METABOLIC PANEL
Anion gap: 6 (ref 5–15)
BUN: 12 mg/dL (ref 6–20)
CO2: 27 mmol/L (ref 22–32)
Calcium: 8.2 mg/dL — ABNORMAL LOW (ref 8.9–10.3)
Chloride: 105 mmol/L (ref 98–111)
Creatinine, Ser: 1.09 mg/dL (ref 0.61–1.24)
GFR, Estimated: >60 mL/min (ref >60)
Glucose, Bld: 196 mg/dL — ABNORMAL HIGH (ref 70–99)
Potassium: 4.2 mmol/L (ref 3.5–5.1)
Sodium: 138 mmol/L (ref 135–145)

## 2021-03-08 LAB — CBC
HCT: 28.9 % — ABNORMAL LOW (ref 39.0–52.0)
Hemoglobin: 9.2 g/dL — ABNORMAL LOW (ref 13.0–17.0)
MCH: 28.5 pg (ref 26.0–34.0)
MCHC: 31.8 g/dL (ref 30.0–36.0)
MCV: 89.5 fL (ref 80.0–100.0)
Platelets: 424 10*3/uL — ABNORMAL HIGH (ref 150–400)
RBC: 3.23 MIL/uL — ABNORMAL LOW (ref 4.22–5.81)
RDW: 14.4 % (ref 11.5–15.5)
WBC: 9.7 10*3/uL (ref 4.0–10.5)
nRBC: 0 % (ref 0.0–0.2)

## 2021-03-08 LAB — GLUCOSE, CAPILLARY
Glucose-Capillary: 158 mg/dL — ABNORMAL HIGH (ref 70–99)
Glucose-Capillary: 171 mg/dL — ABNORMAL HIGH (ref 70–99)
Glucose-Capillary: 175 mg/dL — ABNORMAL HIGH (ref 70–99)
Glucose-Capillary: 200 mg/dL — ABNORMAL HIGH (ref 70–99)
Glucose-Capillary: 210 mg/dL — ABNORMAL HIGH (ref 70–99)
Glucose-Capillary: 251 mg/dL — ABNORMAL HIGH (ref 70–99)

## 2021-03-08 MED ORDER — ZINC SULFATE 220 (50 ZN) MG PO CAPS
220.0000 mg | ORAL_CAPSULE | Freq: Every day | ORAL | Status: DC
  Administered 2021-03-08 – 2021-03-10 (×3): 220 mg via ORAL
  Filled 2021-03-08 (×3): qty 1

## 2021-03-09 DIAGNOSIS — E1165 Type 2 diabetes mellitus with hyperglycemia: Secondary | ICD-10-CM

## 2021-03-09 DIAGNOSIS — F17200 Nicotine dependence, unspecified, uncomplicated: Secondary | ICD-10-CM

## 2021-03-09 LAB — GLUCOSE, CAPILLARY
Glucose-Capillary: 178 mg/dL — ABNORMAL HIGH (ref 70–99)
Glucose-Capillary: 153 mg/dL — ABNORMAL HIGH (ref 70–99)
Glucose-Capillary: 178 mg/dL — ABNORMAL HIGH (ref 70–99)
Glucose-Capillary: 216 mg/dL — ABNORMAL HIGH (ref 70–99)
Glucose-Capillary: 263 mg/dL — ABNORMAL HIGH (ref 70–99)

## 2021-03-09 NOTE — Progress Notes (Signed)
OT Cancellation Note  Patient Details Name: IVEN EARNHART MRN: [id number] DOB: 25-Aug-1965   Cancelled Treatment:    Reason Eval/Treat Not Completed: Patient declined, no reason specified patient refused to participate stating he was going home tomorrow and he needed his nap today.   Sharyn Blitz OTR/L, MS Acute Rehabilitation Department Office# [phone] Pager# [phone]   Chalmers Guest Yehya Brendle 03/09/2021, 1:42 PM

## 2021-03-09 NOTE — TOC Progression Note (Addendum)
Transition of Care [facility]) - Progression Note    Patient Details  Name: Andres Richardson MRN: [id number] Date of Birth: 10-Oct-1965  [facility] CM/SW Contact  Darleene Cleaver, [place] Phone Number: 03/09/2021, 5:01 PM  Clinical Narrative:     [facility] came to assess patient, however he does not want to go to SNF.  Per [facility], patient has made plans to have a friend help out with wound care.  Patient does not qualify for charity home health services.  CSW was advised to see if patient can be set up with an appointment at [facility] [phone] to provide extra support for patient and caregiver if needed.  CSW had to leave a voice mail, left message for them to call covering [facility] worker tomorrow.   Expected Discharge Plan: Home/Self Care Barriers to Discharge: Other (must enter comment) (Plan for wound care at home)  Expected Discharge Plan and Services Expected Discharge Plan: Home/Self Care   Discharge Planning Services: CM Consult   Living arrangements for the past 2 months: Single Family Home                                       Social Determinants of Health (SDOH) Interventions    Readmission Risk Interventions No flowsheet data found.

## 2021-03-09 NOTE — Progress Notes (Signed)
Nutrition Note  RD consulted for nutrition education regarding diabetes.   Lab Results  Component Value Date   HGBA1C 9.5 (H) 02/22/2021    RD provided "Carbohydrate Counting for People with Diabetes" handout from the Academy of Nutrition and Dietetics. Discussed different food groups and their effects on blood sugar, emphasizing carbohydrate-containing foods. Provided list of carbohydrates and recommended serving sizes of common foods.  Discussed diet and wound healing. Discussed protein foods and high protein snacks as pt states he does not like the Ensure Max or other protein shakes.  Discussed importance of controlled and consistent carbohydrate intake throughout the day. Provided examples of ways to balance meals/snacks and encouraged intake of high-fiber, whole grain complex carbohydrates. Teach back method used.  Expect good compliance. Will have support at home.  Body mass index is 31.27 kg/m. Pt meets criteria for overweight based on current BMI.  Current diet order is CHO modified, patient is consuming approximately 100% of meals at this time. Labs and medications reviewed. No further nutrition interventions warranted at this time. RD contact information provided. If additional nutrition issues arise, please re-consult RD.  Tilda Franco, MS, RD, LDN Inpatient Clinical Dietitian Contact information available via Amion

## 2021-03-09 NOTE — Consult Note (Signed)
WOC Nurse ostomy follow up Stoma type/location: LLQ, loop colostomy Output brown stool  Ostomy pouching: 1pc.flex convex, belt, 2" skin barrier ring Education provided: patient and roommate feel comfortable with pouching and care Enrolled patient in [place] Secure [employer] Discharge program: Yes Trying to assist patient with indigent services.  Reminded him to call and stay on hold.  Will plan to change pouch in the am prior to DC.   Alfonso Carden [facility], CNS, [employer] [phone]

## 2021-03-09 NOTE — Progress Notes (Signed)
Physical Therapy Treatment Patient Details Name: Andres Richardson MRN: [id number] DOB: 11-Jan-1966 Today's Date: 03/09/2021    History of Present Illness 55 year old male admitted with intractable nausea vomiting, DKA managed with insulin drip on 7/10.  Patient however demonstrated ongoing fever and vomiting CT abdomen showed perirectal abscess for fournier gangrene surgery, urology was consulted.  Patient is status post debridement by general surgery and urology 7/12- "laparoscopic creation of loop sigmoid colostomy, laparoscopic lysis of adhesions, perineal exam under anesthesia"    PT Comments    Pt required max encouragement for OOB and ambulation this session. Pt required min assist to log roll to EOB and exhibited compensatory strategies to then come to standing at EOB, as pt reports bottom/sacral pain too great to sit upright at EOB. Pt required min assist to power up to standing and to steady. Pt able to ambulate 145 feet in hall with min guard and no LOB. Pt returned to room and standing (due to pain during sitting) LE TE focused on ROM and strength. Pt had mild complaints of dizziness upon standing but stated they had subsided during ambulation.    Follow Up Recommendations  Home health PT     Equipment Recommendations  None recommended by PT    Recommendations for Other Services       Precautions / Restrictions Precautions Precautions: Other (comment) Precaution Comments: caution with perianal wounds with mobility Restrictions Weight Bearing Restrictions: No    Mobility  Bed Mobility Overal bed mobility: Needs Assistance Bed Mobility: Rolling;Sidelying to Sit;Sit to Sidelying Rolling: Supervision Sidelying to sit: Supervision Supine to sit: Min assist Sit to supine: Min assist Sit to sidelying: Supervision General bed mobility comments: pt with use of compensatory strategies for bed mobility due to pain. Pt transfers form elevated surface using log roll technique with  use of bed rails and quickly swinging LEs back into bed once seated for sit to supine transfer at EOS.    Transfers Overall transfer level: Needs assistance Equipment used: Rolling walker (2 wheeled) Transfers: Sit to/from Stand Sit to Stand: Supervision;From elevated surface Stand pivot transfers: Min guard       General transfer comment: supervision for safety.  Ambulation/Gait Ambulation/Gait assistance: Supervision Gait Distance (Feet): 145 Feet Assistive device: Rolling walker (2 wheeled) Gait Pattern/deviations: Decreased stride length;Step-through pattern;Antalgic Gait velocity: decreased   General Gait Details: No LOB observed. Pt noted with slight decreased in step length and very mild limp on R LE, pt reported increased pain while ambulating.   Stairs             Wheelchair Mobility    Modified Rankin (Stroke Patients Only)       Balance Overall balance assessment: Needs assistance Sitting-balance support: No upper extremity supported;Feet supported Sitting balance-Leahy Scale: Fair     Standing balance support: Bilateral upper extremity supported;During functional activity;No upper extremity supported Standing balance-Leahy Scale: Fair                              Cognition Arousal/Alertness: Awake/alert Behavior During Therapy: WFL for tasks assessed/performed Overall Cognitive Status: Within Functional Limits for tasks assessed                                 General Comments: required increased encouragement from PT to participate in mobility this session, ultimately agreeable and in good spirits once up to ambulation  Exercises General Exercises - Lower Extremity Hip ABduction/ADduction: AROM;Left;10 reps;Standing Hip Flexion/Marching: AROM;10 reps;Standing;Left Heel Raises: AROM;Left;5 reps;Standing Mini-Sqauts: AROM;Both;10 reps;Standing Other Exercises Other Exercises: Standing L hip extension x10 B UE  support on elevated bed rail Other Exercises: Standing knee flexion x 10 on L    General Comments        Pertinent Vitals/Pain Pain Assessment: Faces Faces Pain Scale: Hurts even more Pain Location: bottom with bed mobility/sitting Pain Descriptors / Indicators: Grimacing;Sore;Tender Pain Intervention(s): Limited activity within patient's tolerance;Monitored during session;Repositioned    Home Living                      Prior Function            PT Goals (current goals can now be found in the care plan section) Acute Rehab PT Goals Patient Stated Goal: to be able to get back home. PT Goal Formulation: With patient Time For Goal Achievement: 03/14/21 Potential to Achieve Goals: Good Progress towards PT goals: Progressing toward goals    Frequency    Min 3X/week      PT Plan Current plan remains appropriate    Co-evaluation              AM-PAC PT "6 Clicks" Mobility   Outcome Measure  Help needed turning from your back to your side while in a flat bed without using bedrails?: A Little Help needed moving from lying on your back to sitting on the side of a flat bed without using bedrails?: A Little Help needed moving to and from a bed to a chair (including a wheelchair)?: A Little Help needed standing up from a chair using your arms (e.g., wheelchair or bedside chair)?: A Little Help needed to walk in hospital room?: A Little Help needed climbing 3-5 steps with a railing? : A Lot 6 Click Score: 17    End of Session Equipment Utilized During Treatment: Gait belt Activity Tolerance: Patient tolerated treatment well;Patient limited by pain Patient left: in bed;with call bell/phone within reach;with family/visitor present;with bed alarm set Nurse Communication: Mobility status PT Visit Diagnosis: Other abnormalities of gait and mobility (R26.89)     Time: 1425-1440 PT Time Calculation (min) (ACUTE ONLY): 15 min  Charges:  $Gait Training: 8-22  mins                     Alma Friendly, PTA Student  Acute Rehabilitation Services Pager : [phone] Office : [phone]    Alma Friendly 03/09/2021, 3:05 PM

## 2021-03-09 NOTE — Progress Notes (Signed)
12 Days Post-Op  Subjective: CC: Doing well. No pain. Tolerating diet without n/v. Having ostomy output. Reports that his friends daughter is going to help with dressing changes at home and was taught how to do them yesterday evening and will be coming back again today to ensure she feels comfortable with this prior to d/c.   Objective: Vital signs in last 24 hours: Temp:  [98.4 F (36.9 C)-98.6 F (37 C)] 98.4 F (36.9 C) (07/25 0419) Pulse Rate:  [88-92] 88 (07/25 0419) Resp:  [18-19] 18 (07/25 0419) BP: (119-145)/(67-77) 136/75 (07/25 0419) SpO2:  [96 %-98 %] 96 % (07/25 0419) Last BM Date: 03/06/21  Intake/Output from previous day: 07/24 0701 - 07/25 0700 In: 720 [P.O.:720] Out: 1150 [Urine:1150] Intake/Output this shift: Total I/O In: 300 [P.O.:300] Out: 400 [Urine:400]  PE: Gen:  Alert, NAD, pleasant Pulm: Normal rate and effort  Abd: Soft, ND, NT +BS, laparoscopic sites with staples in place, c/d/I.  Psych: A&Ox3 Skin: no rashes noted, warm and dry GU: Chaperone present. There is no drainage on packing. Wound as noted below with >85% granulation tissue. No drainage from wound.     Lab Results:  Recent Labs    03/08/21 0853  WBC 9.7  HGB 9.2*  HCT 28.9*  PLT 424*   BMET Recent Labs    03/08/21 0853  NA 138  K 4.2  CL 105  CO2 27  GLUCOSE 196*  BUN 12  CREATININE 1.09  CALCIUM 8.2*   PT/INR No results for input(s): LABPROT, INR in the last 72 hours. CMP     Component Value Date/Time   NA 138 03/08/2021 0853   K 4.2 03/08/2021 0853   CL 105 03/08/2021 0853   CO2 27 03/08/2021 0853   GLUCOSE 196 (H) 03/08/2021 0853   BUN 12 03/08/2021 0853   CREATININE 1.09 03/08/2021 0853   CALCIUM 8.2 (L) 03/08/2021 0853   PROT 7.7 02/22/2021 1118   ALBUMIN 3.3 (L) 02/22/2021 1118   AST 10 (L) 02/22/2021 1118   ALT 10 02/22/2021 1118   ALKPHOS 80 02/22/2021 1118   BILITOT 1.4 (H) 02/22/2021 1118   GFRNONAA >60 03/08/2021 0853   GFRAA >60  05/14/2019 0317   Lipase     Component Value Date/Time   LIPASE 193 (H) 05/10/2019 1946    Studies/Results: No results found.  Anti-infectives: Anti-infectives (From admission, onward)    Start     Dose/Rate Route Frequency Ordered Stop   03/06/21 1400  amoxicillin-clavulanate (AUGMENTIN) 875-125 MG per tablet 1 tablet        1 tablet Oral Every 12 hours 03/06/21 0836 03/11/21 0959   02/27/21 2000  Ampicillin-Sulbactam (UNASYN) 3 g in sodium chloride 0.9 % 100 mL IVPB  Status:  Discontinued        3 g 200 mL/hr over 30 Minutes Intravenous Every 6 hours 02/27/21 1313 03/06/21 0836   02/25/21 2200  vancomycin (VANCOREADY) IVPB 750 mg/150 mL  Status:  Discontinued        750 mg 150 mL/hr over 60 Minutes Intravenous Every 12 hours 02/25/21 1349 02/27/21 1313   02/24/21 1000  vancomycin (VANCOREADY) IVPB 1000 mg/200 mL  Status:  Discontinued        1,000 mg 200 mL/hr over 60 Minutes Intravenous Every 12 hours 02/23/21 2215 02/25/21 1349   02/23/21 2215  clindamycin (CLEOCIN) IVPB 600 mg        600 mg 100 mL/hr over 30 Minutes Intravenous Every 8 hours 02/23/21  2122 02/26/21 2209   02/23/21 2200  vancomycin (VANCOREADY) IVPB 2000 mg/400 mL        2,000 mg 200 mL/hr over 120 Minutes Intravenous  Once 02/23/21 2133 02/23/21 2353   02/23/21 2200  piperacillin-tazobactam (ZOSYN) IVPB 3.375 g  Status:  Discontinued        3.375 g 12.5 mL/hr over 4 Hours Intravenous Every 8 hours 02/23/21 2133 02/27/21 1313        Assessment/Plan Fournier's gangrene POD 13 I&D perineum 15 x 10 x 8 cm, containing skin, subcutaneous tissue, fascia for Fournier's gangrene - Dr. Sheliah Hatch 7/12 POD 12 laparoscopic loop sigmoid colostomy with LOA - Dr. Sheliah Hatch - 7/13 - Cx w/ strep anginosis, Prevotella, beta lactamase +. Cont abx for 14 days total.  - Tolerating diet and having ostomy output - Stoma rod removed - WOC following for teaching. Referral placed to ostomy clinic - PT/OT, mobilize.  Recommended for [facility] - written for - Wound care: Wound clean. BID WTD - Remove staples - Stable for d/c from our standpoint.  FEN: CM, IVF per [facility] VTE: SCDs, LMWH  ID: Augmentin  Foley: Voiding    - below per primary - DKA/T2DM, poorly controlled HLD HTN PAD with claudication Tobacco abuse Obesity - BMI 31.27   LOS: 15 days    Jacinto Halim , [facility] Surgery 03/09/2021, 12:33 PM Please see Amion for pager number during day hours 7:00am-4:30pm

## 2021-03-09 NOTE — Progress Notes (Signed)
PROGRESS NOTE  Andres Richardson TIW:[id number] DOB: February 24, 1966   PCP: [name], MD  Patient is from: Home.  DOA: 02/22/2021 LOS: 15  Chief complaints:  No chief complaint on file.    Brief Narrative / Interim history:  55 year old male with diabetes to with diabetes admitted with intractable nausea vomiting, DKA managed with insulin drip on 7/10.  Patient however demonstrated ongoing fever and vomiting CT abdomen showed perirectal abscess concerning for fournier gangrene - Surgery, Urology were consulted. Patient underwent debridement by general surgery and urology with "laparoscopic creation of loop sigmoid colostomy, laparoscopic lysis of adhesions, perineal exam under anesthesia" on 7/12. Patient colostomy started have some bowel activity 7/15.  Diet was advanced.  Antibiotic changed to Unasyn as culture growing strep.   Patient transferred to [facility] 7/15. At this time is a stable for discharge awaiting disposition as unable to arrange home health to change his dressing at home.  Patient working on finding someone to help with dressing at home.   Subjective: Seen and examined earlier this morning.  No major events overnight of this morning.  No complaints.  He says he will be going home tomorrow.  He says he has a family member who will be doing his dressing twice daily at home.  He says she was here yesterday to watch.  She will be back to practice dressing change with the guidance of RN today.  Objective: Vitals:   03/08/21 1341 03/08/21 1940 03/09/21 0419 03/09/21 1327  BP: (!) 145/67 119/77 136/75 128/68  Pulse: 92 89 88 94  Resp: 19 18 18 18   Temp: 98.6 F (37 C) 98.5 F (36.9 C) 98.4 F (36.9 C) 99.3 F (37.4 C)  TempSrc:  Oral Oral Oral  SpO2: 98% 98% 96% 98%  Weight:      Height:        Intake/Output Summary (Last 24 hours) at 03/09/2021 1452 Last data filed at 03/09/2021 1300 Gross per 24 hour  Intake 780 ml  Output 1400 ml  Net -620 ml   Filed Weights    02/22/21 1509 02/23/21 0400 02/24/21 0751  Weight: 101.1 kg 101.7 kg 101.7 kg    Examination:  GENERAL: No apparent distress.  Nontoxic. HEENT: MMM.  Vision and hearing grossly intact.  NECK: Supple.  No apparent JVD.  RESP: On RA.  No IWOB.  Fair aeration bilaterally. CVS:  RRR. Heart sounds normal.  ABD/GI/GU: BS+. Abd soft, NTND.  Ostomy with normal looking stool. MSK/EXT:  Moves extremities. No apparent deformity. No edema.  SKIN: Dressing over the wound looks DCI.  Wound appears clean.  See picture below for more. NEURO: Awake, alert and oriented appropriately.  No apparent focal neuro deficit. PSYCH: Calm. Normal affect.      Procedures:  7/12-ebridement by general surgery and urology with "laparoscopic creation of loop sigmoid colostomy, laparoscopic lysis of adhesions, perineal exam under anesthesia"   Microbiology summarized: COVID-19 and influenza PCR nonreactive. MRSA PCR screen negative. Fungal abscess culture pending Blood cultures negative Surgical wound culture with Streptococcus anginosus  Assessment & Plan: Fournier's gangrene: S/P debridement 7/12 and loop sigmoid colostomy by gen surgery 7/13.   -OR culture 7/12-w/ strep anginosis -Will complete 14 days of antibiotic course with IV Unasyn >> p.o. Augmentin on 7/27 -Twice daily dressing per surgery.  A friend is returning to practice dressing change today -HH RN on discharge.   Uncontrolled IDDM-2 with DKA, hyperglycemia and Fournier's gangrene: DKA resolved.  A1c 9.5%.  On glipizide, metformin and  70/30 at home. Recent Labs  Lab 03/08/21 2101 03/08/21 2356 03/09/21 0417 03/09/21 0737 03/09/21 1139  GLUCAP 210* 171* 178* 153* 178*  -Increase 70/30 from 15 to 17 units twice daily starting this evening -Continue SSI-moderate -Start atorvastatin  [id number] Hypokalemia: Resolved. Hypernatremia resolved Hypertension?  Normotensive.  Not on meds. Tobacco abuse continue nicotine patch.  Cessation  advised.   Class I obesity Body mass index is 31.27 kg/m. Nutrition Problem: Increased nutrient needs Etiology: post-op healing, wound healing Signs/Symptoms: estimated needs Interventions: Boost Breeze, Juven, Prostat, MVI, Refer to RD note for recommendations   DVT prophylaxis:  enoxaparin (LOVENOX) injection 40 mg Start: 02/26/21 1000  Code Status: Full code Family Communication: Patient and/or RN. Available if any question.  Level of care: Med-Surg Status is: Inpatient  Remains inpatient appropriate because:Unsafe d/c plan  Dispo: The patient is from: Home              Anticipated d/c is to: Home              Patient currently is medically stable to d/c.   Difficult to place patient No       Consultants:  General surgery   Sch Meds:  Scheduled Meds:  acetaminophen  1,000 mg Oral Q6H   amoxicillin-clavulanate  1 tablet Oral Q12H   vitamin C  500 mg Oral BID   Chlorhexidine Gluconate Cloth  6 each Topical Q0600   cyanocobalamin  1,000 mcg Intramuscular Daily   enoxaparin (LOVENOX) injection  40 mg Subcutaneous Q24H   insulin aspart  0-15 Units Subcutaneous Q4H   insulin aspart protamine- aspart  15 Units Subcutaneous BID WC   mouth rinse  15 mL Mouth Rinse BID   methocarbamol  500 mg Oral TID   multivitamin with minerals  1 tablet Oral Daily   nicotine  21 mg Transdermal Daily   nutrition supplement (JUVEN)  1 packet Oral BID BM   pantoprazole  40 mg Oral Daily   potassium chloride  20 mEq Oral Daily   zinc sulfate  220 mg Oral Daily   Continuous Infusions: PRN Meds:.dextrose, ondansetron (ZOFRAN) IV, oxyCODONE  Antimicrobials: Anti-infectives (From admission, onward)    Start     Dose/Rate Route Frequency Ordered Stop   03/06/21 1400  amoxicillin-clavulanate (AUGMENTIN) 875-125 MG per tablet 1 tablet        1 tablet Oral Every 12 hours 03/06/21 0836 03/11/21 0959   02/27/21 2000  Ampicillin-Sulbactam (UNASYN) 3 g in sodium chloride 0.9 % 100 mL IVPB   Status:  Discontinued        3 g 200 mL/hr over 30 Minutes Intravenous Every 6 hours 02/27/21 1313 03/06/21 0836   02/25/21 2200  vancomycin (VANCOREADY) IVPB 750 mg/150 mL  Status:  Discontinued        750 mg 150 mL/hr over 60 Minutes Intravenous Every 12 hours 02/25/21 1349 02/27/21 1313   02/24/21 1000  vancomycin (VANCOREADY) IVPB 1000 mg/200 mL  Status:  Discontinued        1,000 mg 200 mL/hr over 60 Minutes Intravenous Every 12 hours 02/23/21 2215 02/25/21 1349   02/23/21 2215  clindamycin (CLEOCIN) IVPB 600 mg        600 mg 100 mL/hr over 30 Minutes Intravenous Every 8 hours 02/23/21 2122 02/26/21 2209   02/23/21 2200  vancomycin (VANCOREADY) IVPB 2000 mg/400 mL        2,000 mg 200 mL/hr over 120 Minutes Intravenous  Once 02/23/21 2133 02/23/21 2353   02/23/21  2200  piperacillin-tazobactam (ZOSYN) IVPB 3.375 g  Status:  Discontinued        3.375 g 12.5 mL/hr over 4 Hours Intravenous Every 8 hours 02/23/21 2133 02/27/21 1313        I have personally reviewed the following labs and images: CBC: Recent Labs  Lab 03/03/21 0451 03/08/21 0853  WBC 8.3 9.7  HGB 11.4* 9.2*  HCT 35.1* 28.9*  MCV 89.1 89.5  PLT 355 424*   BMP &GFR Recent Labs  Lab 03/03/21 0451 03/08/21 0853  NA 140 138  K 3.3* 4.2  CL 104 105  CO2 26 27  GLUCOSE 104* 196*  BUN 13 12  CREATININE 1.05 1.09  CALCIUM 8.0* 8.2*   Estimated Creatinine Clearance: 93 mL/min (by C-G formula based on SCr of 1.09 mg/dL). Liver & Pancreas: No results for input(s): AST, ALT, ALKPHOS, BILITOT, PROT, ALBUMIN in the last 168 hours. No results for input(s): LIPASE, AMYLASE in the last 168 hours. No results for input(s): AMMONIA in the last 168 hours. Diabetic: No results for input(s): HGBA1C in the last 72 hours. Recent Labs  Lab 03/08/21 2101 03/08/21 2356 03/09/21 0417 03/09/21 0737 03/09/21 1139  GLUCAP 210* 171* 178* 153* 178*   Cardiac Enzymes: No results for input(s): CKTOTAL, CKMB, CKMBINDEX,  TROPONINI in the last 168 hours. No results for input(s): PROBNP in the last 8760 hours. Coagulation Profile: No results for input(s): INR, PROTIME in the last 168 hours. Thyroid Function Tests: No results for input(s): TSH, T4TOTAL, FREET4, T3FREE, THYROIDAB in the last 72 hours. Lipid Profile: No results for input(s): CHOL, HDL, LDLCALC, TRIG, CHOLHDL, LDLDIRECT in the last 72 hours. Anemia Panel: No results for input(s): VITAMINB12, FOLATE, FERRITIN, TIBC, IRON, RETICCTPCT in the last 72 hours. Urine analysis:    Component Value Date/Time   COLORURINE YELLOW 05/10/2019 2350   APPEARANCEUR CLEAR 05/10/2019 2350   LABSPEC 1.021 05/10/2019 2350   PHURINE 5.0 05/10/2019 2350   GLUCOSEU >=500 (A) 05/10/2019 2350   HGBUR SMALL (A) 05/10/2019 2350   BILIRUBINUR NEGATIVE 05/10/2019 2350   KETONESUR 80 (A) 05/10/2019 2350   PROTEINUR 30 (A) 05/10/2019 2350   NITRITE NEGATIVE 05/10/2019 2350   LEUKOCYTESUR NEGATIVE 05/10/2019 2350   Sepsis Labs: Invalid input(s): PROCALCITONIN, LACTICIDVEN  Microbiology: No results found for this or any previous visit (from the past 240 hour(s)).  Radiology Studies: No results found.      Audreyana Huntsberry T. Whitnee Orzel [facility]  If 7PM-7AM, please contact night-coverage [email or web address] 03/09/2021, 2:52 PM

## 2021-03-10 ENCOUNTER — Other Ambulatory Visit (HOSPITAL_COMMUNITY): Payer: Self-pay

## 2021-03-10 LAB — GLUCOSE, CAPILLARY
Glucose-Capillary: 100 mg/dL — ABNORMAL HIGH (ref 70–99)
Glucose-Capillary: 149 mg/dL — ABNORMAL HIGH (ref 70–99)
Glucose-Capillary: 172 mg/dL — ABNORMAL HIGH (ref 70–99)

## 2021-03-10 MED ORDER — AMOXICILLIN-POT CLAVULANATE 875-125 MG PO TABS
1.0000 | ORAL_TABLET | Freq: Two times a day (BID) | ORAL | 0 refills | Status: DC
  Filled 2021-03-10: qty 4, 2d supply, fill #0

## 2021-03-10 MED ORDER — NOVOLIN 70/30 (70-30) 100 UNIT/ML ~~LOC~~ SUSP
15.0000 [IU] | Freq: Two times a day (BID) | SUBCUTANEOUS | 1 refills | Status: DC
Start: 2021-03-10 — End: 2021-12-06
  Filled 2021-03-10: qty 20, 67d supply, fill #0

## 2021-03-10 MED ORDER — METFORMIN HCL ER 750 MG PO TB24
750.0000 mg | ORAL_TABLET | Freq: Two times a day (BID) | ORAL | 1 refills | Status: DC
  Filled 2021-03-10: qty 90, 45d supply, fill #0

## 2021-03-10 MED ORDER — ATORVASTATIN CALCIUM 20 MG PO TABS
20.0000 mg | ORAL_TABLET | Freq: Every day | ORAL | 1 refills | Status: DC
  Filled 2021-03-10: qty 90, 90d supply, fill #0

## 2021-03-10 MED ORDER — OXYCODONE HCL 5 MG PO TABS
5.0000 mg | ORAL_TABLET | Freq: Four times a day (QID) | ORAL | 0 refills | Status: DC | PRN
  Filled 2021-03-10: qty 15, 4d supply, fill #0

## 2021-03-10 MED ORDER — ADULT MULTIVITAMIN W/MINERALS CH: 1.0000 | ORAL_TABLET | Freq: Every day | ORAL | 1 refills | Status: DC

## 2021-03-10 MED ORDER — GLIPIZIDE ER 5 MG PO TB24
5.0000 mg | ORAL_TABLET | Freq: Every day | ORAL | 1 refills | Status: DC
  Filled 2021-03-10: qty 90, 90d supply, fill #0

## 2021-03-10 NOTE — Discharge Instructions (Addendum)
Wet to Dry WOUND CARE: - Change dressing twice daily - Supplies: sterile saline, kerlex, scissors, ABD pads, tape  Remove dressing and all packing carefully, moistening with sterile saline as needed to avoid packing/internal dressing sticking to the wound. 2.   Clean edges of skin around the wound with water/gauze, making sure there is no tape debris or leakage left on skin that could cause skin irritation or breakdown. 3.   Dampen and clean kerlex with sterile saline and pack wound from wound base to skin level, making sure to take note of any possible areas of wound tracking, tunneling and packing appropriately. Wound can be packed loosely. Trim kerlex to size if a whole kerlex is not required. 4.   Cover wound with a dry ABD pad and secure with tape.  5.   Write the date/time on the dry dressing/tape to better track when the last dressing change occurred. - apply any skin protectant/powder if recommended by clinician to protect skin/skin folds. - change dressing as needed if leakage occurs, wound gets contaminated, or patient requests to shower. - You may shower daily with wound open and following the shower the wound should be dried and a clean dressing placed.  - Medical grade tape as well as packing supplies can be found at [employer] on [address] or [employer] on [address]. The remaining supplies can be found at your local drug store, [employer] etc.  Call [facility] for any fever, vomiting, abdominal pain not controlled with medications or new abdominal pain, problems with ostomy, redness or streaking around your perineal wound, new drainage from your perineal wound or any other questions or concerns. Please do not drive or operate heavy machinery while taking narcotic pain medication.

## 2021-03-10 NOTE — Discharge Summary (Signed)
Physician Discharge Summary  Andres Richardson VVZ:[id number] DOB: 19-Apr-1966 DOA: 02/22/2021  PCP: [name], MD  Admit date: 02/22/2021 Discharge date: 03/10/2021  Admitted From: Home Disposition: Home  Recommendations for Outpatient Follow-up:  Follow ups as below. Please obtain CBC/BMP/Mag at follow up Please follow up on the following pending results: None  Home Health: HH RN/PT/OT Equipment/Devices: Wound dressing supplies  Discharge Condition: Stable CODE STATUS: Full code   Follow-up Information     [name], MD Follow up in 1 week(s).   Specialty: Family Medicine Contact information: [address] [place] [address] [phone]         Kinsinger, De Blanch, MD Follow up.   Specialty: General Surgery Why: 8/17 at 11:10 am. Please bring a copy of your photo ID and insurance card. Please arrive at 10:10am for paperwork. Contact information: [address] [place] [address] [phone]                 Hospital Course: 55 year old male with diabetes to with diabetes admitted with intractable nausea vomiting, DKA managed with insulin drip on 7/10.  Patient however demonstrated ongoing fever and vomiting CT abdomen showed perirectal abscess concerning for fournier gangrene - Surgery, Urology were consulted. Patient underwent debridement by general surgery and urology with "laparoscopic creation of loop sigmoid colostomy, laparoscopic lysis of adhesions, perineal exam under anesthesia" on 7/12. Patient colostomy started have some bowel activity 7/15.  Diet was advanced.  Antibiotic changed to Unasyn as culture growing strep.   Patient transferred to [facility] 7/15.  Continued on p.o. antibiotics and twice daily dressing changes with the help of general surgery and wound care nurse.  He is discharged home.  Family member to help with twice daily dressing change and ostomy care.  [facility] RN ordered as well.  See individual  problem list below for more on hospital course.  Discharge Diagnoses:  Fournier's gangrene: S/P debridement 7/12 and loop sigmoid colostomy by gen surgery 7/13.   -OR culture 7/12-w/ strep anginosis -Will complete 14 days of antibiotic course with IV Unasyn >> p.o. Augmentin on 7/27 -Twice daily dressing per surgery.  Family friend will help with ostomy care and twice daily dressing at home -[facility] RN ordered as well. -Outpatient follow-up with general surgery as above   Uncontrolled IDDM-2 with DKA, hyperglycemia and Fournier's gangrene: DKA resolved.  A1c 9.5%.  On glipizide, metformin and 70/30 at 20 units daily at home. Recent Labs  Lab 03/09/21 1621 03/09/21 2004 03/10/21 0002 03/10/21 0449 03/10/21 0721  GLUCAP 216* 263* 100* 172* 149*  -Continue home Novolin 70/30 at 15 units twice daily -Continue home metformin ER 500 mg daily -Continue home glipizide ER 5 mg daily -Added atorvastatin 20 mg daily  Debility/generalized weakness -HH PT/OT ordered.   XEN:MMHWKGSU Hypokalemia: Resolved. Hypernatremia resolved Hypertension?  Normotensive.  Not on meds. Tobacco abuse continue nicotine patch.  Cessation advised.   Class I obesity Body mass index is 31.27 kg/m. Nutrition Problem: Increased nutrient needs Etiology: post-op healing, wound healing Signs/Symptoms: estimated needs Interventions: Boost Breeze, Juven, Prostat, MVI, Refer to RD note for recommendations      Discharge Exam: Vitals:   03/09/21 2006 03/10/21 0452  BP: 101/62 134/74  Pulse: 83 90  Resp: 18 18  Temp: 99 F (37.2 C) 98.8 F (37.1 C)  SpO2: 96% 97%    GENERAL: No apparent distress.  Nontoxic. HEENT: MMM.  Vision and hearing grossly intact.  NECK: Supple.  No  apparent JVD.  RESP:  No IWOB.  Fair aeration bilaterally. CVS:  RRR. Heart sounds normal.  ABD/GI/GU: Bowel sounds present. Soft. Non tender.  Ostomy bag MSK/EXT:  Moves extremities. No apparent deformity. No edema.  SKIN: Dressing  over perineal wound DCI. NEURO: Awake, alert and oriented appropriately.  No apparent focal neuro deficit. PSYCH: Calm. Normal affect.   Discharge Instructions  Discharge Instructions     Amb Referral to Ostomy Clinic   Complete by: As directed    Reason for referral modifiers: Pre and post-operative counseling for ostomy management   Diet - low sodium heart healthy   Complete by: As directed    Diet Carb Modified   Complete by: As directed    Discharge instructions   Complete by: As directed    It has been a pleasure taking care of you!  You were hospitalized due to diabetic ketoacidosis and infection from uncontrolled diabetes.  You were treated surgically medically.  We are discharging you more antibiotics to complete treatment course.  It is very important that you take your diabetic medications as prescribed to get your diabetes under good control which could help with wound healing and reduce your risk of other diabetic complications.  Please review your new medication list and the directions on your medications before you take them.  Please follow the instruction by the surgeon about surgical wound care and follow-up.  Follow-up with your primary care doctor in 1 to 2 weeks or sooner if needed.  It is important that you quit smoking cigarettes.  You may use nicotine patch to help you quit smoking.  Nicotine patch is available over-the-counter.  You may also discuss other options to help you quit smoking with your primary care doctor. You can also talk to professional counselors at 1-800-QUIT-NOW 431-298-9804) for free smoking cessation counseling.     Take care,   Discharge wound care:   Complete by: As directed    Cleanse with NS, pat gently dry. Fill defects with saline moistened roll gauze, top with dry gauze, ABD pads and secure with mesh underwear. Change PRN for soiling, otherwise two times daily.   Increase activity slowly   Complete by: As directed        Allergies as of 03/10/2021       Reactions   Adhesive [tape] Rash        Medication List     STOP taking these medications    ondansetron 4 MG tablet Commonly known as: Zofran   phenol 1.4 % Liqd Commonly known as: CHLORASEPTIC       TAKE these medications    amoxicillin-clavulanate 875-125 MG tablet Commonly known as: AUGMENTIN Take 1 tablet by mouth every 12 (twelve) hours.   atorvastatin 20 MG tablet Commonly known as: Lipitor Take 1 tablet (20 mg total) by mouth daily.   glipiZIDE 5 MG 24 hr tablet Commonly known as: GLUCOTROL XL Take 1 tablet (5 mg total) by mouth daily.   metFORMIN 750 MG 24 hr tablet Commonly known as: GLUCOPHAGE-XR Take 1 tablet (750 mg total) by mouth 2 (two) times daily.   multivitamin with minerals Tabs tablet Take 1 tablet by mouth daily.   NovoLIN 70/30 (70-30) 100 UNIT/ML injection Generic drug: insulin NPH-regular Human Inject 15 Units into the skin 2 (two) times daily with a meal. What changed:  how much to take when to take this   oxyCODONE 5 MG immediate release tablet Commonly known as: Oxy IR/ROXICODONE Take 1 tablet by  mouth every 6 hours as needed for breakthrough pain.   pantoprazole 40 MG tablet Commonly known as: Protonix Take 1 tablet (40 mg total) by mouth daily.       ASK your doctor about these medications    INSULIN SYRINGE .3CC/31GX5/16" 31G X 5/16" 0.3 ML Misc Levemir 15 units twice a day               Durable Medical Equipment  (From admission, onward)           Start     Ordered   03/03/21 0921  For home use only DME 3 n 1  Once        03/03/21 0920              Discharge Care Instructions  (From admission, onward)           Start     Ordered   03/10/21 0000  Discharge wound care:       Comments: Cleanse with NS, pat gently dry. Fill defects with saline moistened roll gauze, top with dry gauze, ABD pads and secure with mesh underwear. Change PRN for soiling,  otherwise two times daily.   03/10/21 0804            Consultations: General surgery  Procedures/Studies: 7/12-ebridement by general surgery and urology with "laparoscopic creation of loop sigmoid colostomy, laparoscopic lysis of adhesions, perineal exam under anesthesia"   CT ABDOMEN PELVIS W CONTRAST  Result Date: 02/23/2021 CLINICAL DATA:  Abdominal pain with nausea and vomiting EXAM: CT ABDOMEN AND PELVIS WITH CONTRAST TECHNIQUE: Multidetector CT imaging of the abdomen and pelvis was performed using the standard protocol following bolus administration of intravenous contrast. CONTRAST:  8mL OMNIPAQUE IOHEXOL 350 MG/ML SOLN COMPARISON:  05/04/2017 FINDINGS: Lower chest: Mild emphysematous changes are noted. No focal infiltrate or sizable effusion is seen. Hepatobiliary: Fatty infiltration of the liver is noted. The gallbladder is well distended. Dependent density is seen which may be related to gallbladder sludge or small stones. Pancreas: Unremarkable. No pancreatic ductal dilatation or surrounding inflammatory changes. Spleen: Normal in size without focal abnormality. Adrenals/Urinary Tract: Adrenal glands are within normal limits. Kidneys are well visualized bilaterally. Normal enhancement pattern is seen bilaterally. No renal calculi or obstructive changes are noted. Delayed images demonstrate normal excretion of contrast material. Ureters are within normal limits. Bladder is well distended. Stomach/Bowel: Colon is well visualized without obstructive or inflammatory changes. Mild retained fecal material is noted within the right colon consistent with a degree of constipation. The appendix is not well visualized and may have been surgically removed. Small bowel shows no obstructive changes. The stomach is within normal limits. Vascular/Lymphatic: Aortic atherosclerosis. Few small reactive lymph nodes are noted in the right iliac chain. Reproductive: Prostate is unremarkable. Other: There is  subcutaneous inflammatory change in the region of the perineum extending into both buttocks superiorly slightly greater on the left than the right. A small focal subcutaneous fluid collection is noted which measures 2.7 x 2.2 cm best noted on image number 12 of series 4 in the medial aspect of the right buttock along the intergluteal cleft. The subcutaneous air extends superiorly adjacent to the right inguinal canal. No other definitive fluid collection is seen. These changes are most consistent with Fournier's gangrene. Additionally some inflammatory change extends along the right lateral pelvic wall posteriorly with some reactive lymphadenopathy identified. No free fluid is noted. Musculoskeletal: No acute bony abnormality is noted. IMPRESSION: Changes in the subcutaneous tissues of the  perineum/bilateral buttocks and extending into the right lateral pelvic wall consistent with Fournier's gangrene. Some thickening of the operator internus muscle on the right is noted. A small subcutaneous fluid collection is noted in the medial aspect of the right buttocks. Mild changes of colonic constipation in the right colon. Dependent density in the gallbladder likely representing sludge or small gallstones. Mild emphysematous changes. These results will be called to the ordering clinician or representative by the Radiologist Assistant, and communication documented in the PACS or [employer] Dashboard. Electronically Signed   By: Alcide CleverMark  Lukens M.D.   On: 02/23/2021 20:01       The results of significant diagnostics from this hospitalization (including imaging, microbiology, ancillary and laboratory) are listed below for reference.     Microbiology: No results found for this or any previous visit (from the past 240 hour(s)).   Labs:  CBC: Recent Labs  Lab 03/08/21 0853  WBC 9.7  HGB 9.2*  HCT 28.9*  MCV 89.5  PLT 424*   BMP &GFR Recent Labs  Lab 03/08/21 0853  NA 138  K 4.2  CL 105  CO2 27  GLUCOSE  196*  BUN 12  CREATININE 1.09  CALCIUM 8.2*   Estimated Creatinine Clearance: 93 mL/min (by C-G formula based on SCr of 1.09 mg/dL). Liver & Pancreas: No results for input(s): AST, ALT, ALKPHOS, BILITOT, PROT, ALBUMIN in the last 168 hours. No results for input(s): LIPASE, AMYLASE in the last 168 hours. No results for input(s): AMMONIA in the last 168 hours. Diabetic: No results for input(s): HGBA1C in the last 72 hours. Recent Labs  Lab 03/09/21 1621 03/09/21 2004 03/10/21 0002 03/10/21 0449 03/10/21 0721  GLUCAP 216* 263* 100* 172* 149*   Cardiac Enzymes: No results for input(s): CKTOTAL, CKMB, CKMBINDEX, TROPONINI in the last 168 hours. No results for input(s): PROBNP in the last 8760 hours. Coagulation Profile: No results for input(s): INR, PROTIME in the last 168 hours. Thyroid Function Tests: No results for input(s): TSH, T4TOTAL, FREET4, T3FREE, THYROIDAB in the last 72 hours. Lipid Profile: No results for input(s): CHOL, HDL, LDLCALC, TRIG, CHOLHDL, LDLDIRECT in the last 72 hours. Anemia Panel: No results for input(s): VITAMINB12, FOLATE, FERRITIN, TIBC, IRON, RETICCTPCT in the last 72 hours. Urine analysis:    Component Value Date/Time   COLORURINE YELLOW 05/10/2019 2350   APPEARANCEUR CLEAR 05/10/2019 2350   LABSPEC 1.021 05/10/2019 2350   PHURINE 5.0 05/10/2019 2350   GLUCOSEU >=500 (A) 05/10/2019 2350   HGBUR SMALL (A) 05/10/2019 2350   BILIRUBINUR NEGATIVE 05/10/2019 2350   KETONESUR 80 (A) 05/10/2019 2350   PROTEINUR 30 (A) 05/10/2019 2350   NITRITE NEGATIVE 05/10/2019 2350   LEUKOCYTESUR NEGATIVE 05/10/2019 2350   Sepsis Labs: Invalid input(s): PROCALCITONIN, LACTICIDVEN   Time coordinating discharge: 35 minutes  SIGNED:  Almon Herculesaye T Nikkol Pai, MD  [facility] 03/10/2021, 11:34 AM  If 7PM-7AM, please contact night-coverage [email or web address]

## 2021-03-10 NOTE — Progress Notes (Signed)
13 Days Post-Op  Subjective: CC: Doing well. No abdominal pain, n/v. Tolerating dressing changes. Reports his friends sister has learned dressing changes and will be performing them BID at discharge.   Objective: Vital signs in last 24 hours: Temp:  [98.8 F (37.1 C)-99.3 F (37.4 C)] 98.8 F (37.1 C) (07/26 0452) Pulse Rate:  [83-94] 90 (07/26 0452) Resp:  [18] 18 (07/26 0452) BP: (101-134)/(62-74) 134/74 (07/26 0452) SpO2:  [96 %-98 %] 97 % (07/26 0452) Last BM Date: 03/09/21  Intake/Output from previous day: 07/25 0701 - 07/26 0700 In: 780 [P.O.:780] Out: 2150 [Urine:2000; Stool:150] Intake/Output this shift: No intake/output data recorded.  PE: Gen:  Alert, NAD, pleasant Pulm: Normal rate and effort  Abd: Soft, ND, NT +BS, laparoscopic sites c/d/I with staples removed. Ostomy bag with stool in bag Psych: A&Ox3 Skin: no rashes noted, warm and dry GU: Perineal wound clean and without signs of infection or drainage. Stable from picture yesterday.   Lab Results:  Recent Labs    03/08/21 0853  WBC 9.7  HGB 9.2*  HCT 28.9*  PLT 424*   BMET Recent Labs    03/08/21 0853  NA 138  K 4.2  CL 105  CO2 27  GLUCOSE 196*  BUN 12  CREATININE 1.09  CALCIUM 8.2*   PT/INR No results for input(s): LABPROT, INR in the last 72 hours. CMP     Component Value Date/Time   NA 138 03/08/2021 0853   K 4.2 03/08/2021 0853   CL 105 03/08/2021 0853   CO2 27 03/08/2021 0853   GLUCOSE 196 (H) 03/08/2021 0853   BUN 12 03/08/2021 0853   CREATININE 1.09 03/08/2021 0853   CALCIUM 8.2 (L) 03/08/2021 0853   PROT 7.7 02/22/2021 1118   ALBUMIN 3.3 (L) 02/22/2021 1118   AST 10 (L) 02/22/2021 1118   ALT 10 02/22/2021 1118   ALKPHOS 80 02/22/2021 1118   BILITOT 1.4 (H) 02/22/2021 1118   GFRNONAA >60 03/08/2021 0853   GFRAA >60 05/14/2019 0317   Lipase     Component Value Date/Time   LIPASE 193 (H) 05/10/2019 1946    Studies/Results: No results  found.  Anti-infectives: Anti-infectives (From admission, onward)    Start     Dose/Rate Route Frequency Ordered Stop   03/10/21 0000  amoxicillin-clavulanate (AUGMENTIN) 875-125 MG tablet        1 tablet Oral Every 12 hours 03/10/21 0804     03/06/21 1400  amoxicillin-clavulanate (AUGMENTIN) 875-125 MG per tablet 1 tablet        1 tablet Oral Every 12 hours 03/06/21 0836 03/11/21 0959   02/27/21 2000  Ampicillin-Sulbactam (UNASYN) 3 g in sodium chloride 0.9 % 100 mL IVPB  Status:  Discontinued        3 g 200 mL/hr over 30 Minutes Intravenous Every 6 hours 02/27/21 1313 03/06/21 0836   02/25/21 2200  vancomycin (VANCOREADY) IVPB 750 mg/150 mL  Status:  Discontinued        750 mg 150 mL/hr over 60 Minutes Intravenous Every 12 hours 02/25/21 1349 02/27/21 1313   02/24/21 1000  vancomycin (VANCOREADY) IVPB 1000 mg/200 mL  Status:  Discontinued        1,000 mg 200 mL/hr over 60 Minutes Intravenous Every 12 hours 02/23/21 2215 02/25/21 1349   02/23/21 2215  clindamycin (CLEOCIN) IVPB 600 mg        600 mg 100 mL/hr over 30 Minutes Intravenous Every 8 hours 02/23/21 2122 02/26/21 2209   02/23/21  2200  vancomycin (VANCOREADY) IVPB 2000 mg/400 mL        2,000 mg 200 mL/hr over 120 Minutes Intravenous  Once 02/23/21 2133 02/23/21 2353   02/23/21 2200  piperacillin-tazobactam (ZOSYN) IVPB 3.375 g  Status:  Discontinued        3.375 g 12.5 mL/hr over 4 Hours Intravenous Every 8 hours 02/23/21 2133 02/27/21 1313        Assessment/Plan Fournier's gangrene POD 14 I&D perineum 15 x 10 x 8 cm, containing skin, subcutaneous tissue, fascia for Fournier's gangrene - Dr. Sheliah Hatch 7/12 POD 13 laparoscopic loop sigmoid colostomy with LOA - Dr. Sheliah Hatch - 7/13 - Cx w/ strep anginosis, Prevotella, beta lactamase +. Finishes abx today - Tolerating diet and having ostomy output - Stoma rod removed - WOC following for teaching. Referral placed to ostomy clinic - PT/OT, mobilize. Recommended for [facility] -  written for - Wound care: Wound clean. BID WTD - Staples removed - Stable for d/c from our standpoint. Sent for follow up. Pain meds sent to pharmacy.   FEN: CM, IVF per [facility] VTE: SCDs, LMWH  ID: Augmentin  Foley: Voiding    - below per primary - DKA/T2DM, poorly controlled HLD HTN PAD with claudication Tobacco abuse Obesity - BMI 31.27   LOS: 16 days    Jacinto Halim , [facility] 03/10/2021, 8:37 AM Please see Amion for pager number during day hours 7:00am-4:30pm

## 2021-03-10 NOTE — Progress Notes (Signed)
Physical Therapy Treatment Patient Details Name: Andres Richardson MRN: [id number] DOB: Jun 11, 1966 Today's Date: 03/10/2021    History of Present Illness 55 year old male admitted with intractable nausea vomiting, DKA managed with insulin drip on 7/10.  Patient however demonstrated ongoing fever and vomiting CT abdomen showed perirectal abscess for fournier gangrene surgery, urology was consulted.  Patient is status post debridement by general surgery and urology 7/12- "laparoscopic creation of loop sigmoid colostomy, laparoscopic lysis of adhesions, perineal exam under anesthesia"    PT Comments    Patient requires encouragement to participate in therapy and was agreeable to OOB if able to dress in preparation for discharge home. He requires supervision for bed mobility and transfers with RW and min assist to complete lower/upper body dressing. No overt LOB noted with standing balance or bed>chair transfer. Patient provided handout on HEP for functional LE strengthening and reviewed verbally at EOS. Acute PT goals met and pt is safe to mobilize with RN staff and mobility team at this time, and has no further acute PT needs. Will sign off to mobilize with supervision from NT or mobility team.    Follow Up Recommendations  Home health PT     Equipment Recommendations  None recommended by PT    Recommendations for Other Services       Precautions / Restrictions Precautions Precautions: Other (comment) Precaution Comments: caution with perianal wounds with mobility Restrictions Weight Bearing Restrictions: No    Mobility  Bed Mobility Overal bed mobility: Needs Assistance   Rolling: Supervision Sidelying to sit: Supervision       General bed mobility comments: pt using HOB elevated feature and bed rail to press up/raise trunk. pt taking extra time due to perineal wounds.    Transfers Overall transfer level: Needs assistance Equipment used: Rolling walker (2  wheeled) Transfers: Sit to/from [facility] Sit to Stand: Supervision;From elevated surface Stand pivot transfers: Supervision;From elevated surface       General transfer comment: supervisionf or safety, no LOB with small steps bed>chair  Ambulation/Gait             General Gait Details: pt declined to ambulate greater distance   Stairs             Wheelchair Mobility    Modified Rankin (Stroke Patients Only)       Balance Overall balance assessment: Needs assistance Sitting-balance support: No upper extremity supported;Feet supported Sitting balance-Leahy Scale: Fair     Standing balance support: Bilateral upper extremity supported;During functional activity;No upper extremity supported Standing balance-Leahy Scale: Fair Standing balance comment: pt rquired min assist for donning shorts and shirt in standing to work arms through sleeves and pull pants up fully. pt using intermittent support on RW while dressing.                            Cognition Arousal/Alertness: Awake/alert Behavior During Therapy: WFL for tasks assessed/performed Overall Cognitive Status: Within Functional Limits for tasks assessed                                 General Comments: pt requires encouragement and bartering to get dressed to mobilize OOB today.      Exercises Other Exercises Other Exercises: HEP handout provided and verbal review of exercises.    General Comments        Pertinent Vitals/Pain Pain Assessment: Faces Faces Pain  Scale: Hurts a little bit Pain Location: bottom with bed mobility/sitting Pain Descriptors / Indicators: Grimacing;Sore;Tender Pain Intervention(s): Limited activity within patient's tolerance;Monitored during session;Repositioned    Home Living                      Prior Function            PT Goals (current goals can now be found in the care plan section) Acute Rehab PT  Goals Patient Stated Goal: to go home today PT Goal Formulation: With patient Time For Goal Achievement: 03/14/21 Potential to Achieve Goals: Good Progress towards PT goals: Progressing toward goals    Frequency    Min 3X/week      PT Plan Current plan remains appropriate    Co-evaluation              AM-PAC PT "6 Clicks" Mobility   Outcome Measure  Help needed turning from your back to your side while in a flat bed without using bedrails?: A Little Help needed moving from lying on your back to sitting on the side of a flat bed without using bedrails?: A Little Help needed moving to and from a bed to a chair (including a wheelchair)?: A Little Help needed standing up from a chair using your arms (e.g., wheelchair or bedside chair)?: A Little Help needed to walk in hospital room?: A Little Help needed climbing 3-5 steps with a railing? : A Little 6 Click Score: 18    End of Session Equipment Utilized During Treatment: Gait belt Activity Tolerance: Patient tolerated treatment well Patient left: in chair;with call bell/phone within reach Nurse Communication: Mobility status PT Visit Diagnosis: Other abnormalities of gait and mobility (R26.89)     Time: 1000-1023 (time spent making HEP handout) PT Time Calculation (min) (ACUTE ONLY): 23 min  Charges:  $Therapeutic Activity: 8-22 mins                     Verner Mould, DPT Acute Rehabilitation Services Office [phone] Pager [phone]    Jacques Navy 03/10/2021, 12:43 PM

## 2021-03-10 NOTE — Consult Note (Signed)
WOC Nurse ostomy follow up Stoma type/location: LLQ, loop colostomy Stomal assessment/size: oval shaped in a crease, 1 1/4" x 2", pink, moist, friable Peristomal assessment: intact  Treatment options for stomal/peristomal skin: 2" skin barrier ring with extra 1/2 of barrier ring at 3 and 9 o'clock  Output gelatinous yellow stool Ostomy pouching: 1pc.convex with (2) 2" skin barrier as described below; ostomy belt  Education provided:  Allowed patient to participate in pouch change Required assistance with smoothing cut edge after skin barrier cut to fit Required assistance with placing barrier rings bc he can not see stoma Not able to place new skin barrier, will need assistance from support at home Independent with lock and roll and can verbalize using wick to clean bottom of spout Did require some cuing with lock and roll Understands rationale for use of belt and can verbalize how to attach to skin barrier He will need assistance at home for sure with pouch changes  Enrolled patient in [place] Secure Start Discharge program: Yes He has yet to enroll in the indigent program; I have reviewed this with him multiple times.  Even calling yesterday to make sure he could get through to them I have provided [facility] catalog today and marked items needed for care.  He will be seen in the outpatient clinic for ostomy follow up.   WOC Nurse will follow along with you for continued support with ostomy teaching and care Alynah Schone [facility], RN, [place], CNS, [place] [phone]

## 2021-03-26 LAB — FUNGUS CULTURE WITH STAIN

## 2021-03-26 LAB — FUNGAL ORGANISM REFLEX

## 2021-03-26 LAB — FUNGUS CULTURE RESULT

## 2021-04-14 LAB — ACID FAST CULTURE WITH REFLEXED SENSITIVITIES (MYCOBACTERIA): Acid Fast Culture: NEGATIVE

## 2021-05-10 ENCOUNTER — Other Ambulatory Visit: Payer: Self-pay

## 2021-05-10 ENCOUNTER — Emergency Department (HOSPITAL_BASED_OUTPATIENT_CLINIC_OR_DEPARTMENT_OTHER): Payer: Self-pay

## 2021-05-10 ENCOUNTER — Inpatient Hospital Stay (HOSPITAL_BASED_OUTPATIENT_CLINIC_OR_DEPARTMENT_OTHER)
Admission: EM | Admit: 2021-05-10 | Discharge: 2021-05-14 | DRG: 871 | Disposition: A | Discharge status: Home / Self Care | Payer: Self-pay | Attending: Internal Medicine | Admitting: Internal Medicine

## 2021-05-10 ENCOUNTER — Encounter (HOSPITAL_BASED_OUTPATIENT_CLINIC_OR_DEPARTMENT_OTHER): Payer: Self-pay | Admitting: *Deleted

## 2021-05-10 DIAGNOSIS — I872 Venous insufficiency (chronic) (peripheral): Secondary | ICD-10-CM | POA: Diagnosis present

## 2021-05-10 DIAGNOSIS — J9602 Acute respiratory failure with hypercapnia: Secondary | ICD-10-CM | POA: Diagnosis present

## 2021-05-10 DIAGNOSIS — Z8249 Family history of ischemic heart disease and other diseases of the circulatory system: Secondary | ICD-10-CM

## 2021-05-10 DIAGNOSIS — J189 Pneumonia, unspecified organism: Secondary | ICD-10-CM

## 2021-05-10 DIAGNOSIS — I808 Phlebitis and thrombophlebitis of other sites: Secondary | ICD-10-CM | POA: Diagnosis not present

## 2021-05-10 DIAGNOSIS — E669 Obesity, unspecified: Secondary | ICD-10-CM | POA: Diagnosis present

## 2021-05-10 DIAGNOSIS — R5381 Other malaise: Secondary | ICD-10-CM | POA: Diagnosis present

## 2021-05-10 DIAGNOSIS — I214 Non-ST elevation (NSTEMI) myocardial infarction: Secondary | ICD-10-CM | POA: Diagnosis present

## 2021-05-10 DIAGNOSIS — J9601 Acute respiratory failure with hypoxia: Secondary | ICD-10-CM | POA: Diagnosis present

## 2021-05-10 DIAGNOSIS — Z794 Long term (current) use of insulin: Secondary | ICD-10-CM

## 2021-05-10 DIAGNOSIS — E1040 Type 1 diabetes mellitus with diabetic neuropathy, unspecified: Secondary | ICD-10-CM | POA: Diagnosis present

## 2021-05-10 DIAGNOSIS — Z7982 Long term (current) use of aspirin: Secondary | ICD-10-CM

## 2021-05-10 DIAGNOSIS — J9622 Acute and chronic respiratory failure with hypercapnia: Secondary | ICD-10-CM

## 2021-05-10 DIAGNOSIS — I70212 Atherosclerosis of native arteries of extremities with intermittent claudication, left leg: Secondary | ICD-10-CM | POA: Diagnosis present

## 2021-05-10 DIAGNOSIS — Z933 Colostomy status: Secondary | ICD-10-CM

## 2021-05-10 DIAGNOSIS — J690 Pneumonitis due to inhalation of food and vomit: Secondary | ICD-10-CM | POA: Diagnosis present

## 2021-05-10 DIAGNOSIS — Z20822 Contact with and (suspected) exposure to covid-19: Secondary | ICD-10-CM | POA: Diagnosis present

## 2021-05-10 DIAGNOSIS — I2510 Atherosclerotic heart disease of native coronary artery without angina pectoris: Secondary | ICD-10-CM | POA: Diagnosis present

## 2021-05-10 DIAGNOSIS — Z6831 Body mass index (BMI) 31.0-31.9, adult: Secondary | ICD-10-CM

## 2021-05-10 DIAGNOSIS — E782 Mixed hyperlipidemia: Secondary | ICD-10-CM | POA: Diagnosis present

## 2021-05-10 DIAGNOSIS — A419 Sepsis, unspecified organism: Principal | ICD-10-CM | POA: Diagnosis present

## 2021-05-10 DIAGNOSIS — R030 Elevated blood-pressure reading, without diagnosis of hypertension: Secondary | ICD-10-CM | POA: Diagnosis present

## 2021-05-10 DIAGNOSIS — Z7984 Long term (current) use of oral hypoglycemic drugs: Secondary | ICD-10-CM

## 2021-05-10 DIAGNOSIS — D649 Anemia, unspecified: Secondary | ICD-10-CM | POA: Diagnosis present

## 2021-05-10 DIAGNOSIS — Z79899 Other long term (current) drug therapy: Secondary | ICD-10-CM

## 2021-05-10 DIAGNOSIS — R6521 Severe sepsis with septic shock: Secondary | ICD-10-CM | POA: Diagnosis present

## 2021-05-10 DIAGNOSIS — J9621 Acute and chronic respiratory failure with hypoxia: Secondary | ICD-10-CM

## 2021-05-10 DIAGNOSIS — Z87891 Personal history of nicotine dependence: Secondary | ICD-10-CM

## 2021-05-10 LAB — CBC WITH DIFFERENTIAL/PLATELET
Abs Immature Granulocytes: 0 10*3/uL (ref 0.00–0.07)
Basophils Absolute: 0 10*3/uL (ref 0.0–0.1)
Basophils Relative: 0 %
Eosinophils Absolute: 0.6 10*3/uL — ABNORMAL HIGH (ref 0.0–0.5)
Eosinophils Relative: 3 %
HCT: 37.3 % — ABNORMAL LOW (ref 39.0–52.0)
Hemoglobin: 10.9 g/dL — ABNORMAL LOW (ref 13.0–17.0)
Lymphocytes Relative: 38 %
Lymphs Abs: 7.1 10*3/uL — ABNORMAL HIGH (ref 0.7–4.0)
MCH: 25.8 pg — ABNORMAL LOW (ref 26.0–34.0)
MCHC: 29.2 g/dL — ABNORMAL LOW (ref 30.0–36.0)
MCV: 88.4 fL (ref 80.0–100.0)
Monocytes Absolute: 1.5 10*3/uL — ABNORMAL HIGH (ref 0.1–1.0)
Monocytes Relative: 8 %
Neutro Abs: 9.6 10*3/uL — ABNORMAL HIGH (ref 1.7–7.7)
Neutrophils Relative %: 51 %
Platelets: 568 10*3/uL — ABNORMAL HIGH (ref 150–400)
RBC: 4.22 MIL/uL (ref 4.22–5.81)
RDW: 16.4 % — ABNORMAL HIGH (ref 11.5–15.5)
WBC: 18.8 10*3/uL — ABNORMAL HIGH (ref 4.0–10.5)
nRBC: 0 % (ref 0.0–0.2)

## 2021-05-10 LAB — POCT I-STAT EG7
Acid-base deficit: 12 mmol/L — ABNORMAL HIGH (ref 0.0–2.0)
Bicarbonate: 17.9 mmol/L — ABNORMAL LOW (ref 20.0–28.0)
Calcium, Ion: 1.21 mmol/L (ref 1.15–1.40)
HCT: 37 % — ABNORMAL LOW (ref 39.0–52.0)
Hemoglobin: 12.6 g/dL — ABNORMAL LOW (ref 13.0–17.0)
O2 Saturation: 76 %
Patient temperature: 98.6
Potassium: 3.5 mmol/L (ref 3.5–5.1)
Sodium: 142 mmol/L (ref 135–145)
TCO2: 20 mmol/L — ABNORMAL LOW (ref 22–32)
pCO2, Ven: 55.3 mmHg (ref 44.0–60.0)
pH, Ven: 7.118 — CL (ref 7.250–7.430)
pO2, Ven: 54 mmHg — ABNORMAL HIGH (ref 32.0–45.0)

## 2021-05-10 LAB — URINALYSIS, ROUTINE W REFLEX MICROSCOPIC
Bilirubin Urine: NEGATIVE
Glucose, UA: 500 mg/dL — AB
Hgb urine dipstick: NEGATIVE
Ketones, ur: NEGATIVE mg/dL
Leukocytes,Ua: NEGATIVE
Nitrite: NEGATIVE
Protein, ur: 100 mg/dL — AB
Specific Gravity, Urine: 1.01 (ref 1.005–1.030)
pH: 6 (ref 5.0–8.0)

## 2021-05-10 LAB — COMPREHENSIVE METABOLIC PANEL
ALT: 18 U/L (ref 0–44)
AST: 20 U/L (ref 15–41)
Albumin: 3.6 g/dL (ref 3.5–5.0)
Alkaline Phosphatase: 64 U/L (ref 38–126)
Anion gap: 17 — ABNORMAL HIGH (ref 5–15)
BUN: 18 mg/dL (ref 6–20)
CO2: 17 mmol/L — ABNORMAL LOW (ref 22–32)
Calcium: 8.5 mg/dL — ABNORMAL LOW (ref 8.9–10.3)
Chloride: 105 mmol/L (ref 98–111)
Creatinine, Ser: 1.1 mg/dL (ref 0.61–1.24)
GFR, Estimated: >60 mL/min (ref >60)
Glucose, Bld: 436 mg/dL — ABNORMAL HIGH (ref 70–99)
Potassium: 3.7 mmol/L (ref 3.5–5.1)
Sodium: 139 mmol/L (ref 135–145)
Total Bilirubin: 0.2 mg/dL — ABNORMAL LOW (ref 0.3–1.2)
Total Protein: 7.4 g/dL (ref 6.5–8.1)

## 2021-05-10 LAB — LACTIC ACID, PLASMA
Lactic Acid, Venous: 8 mmol/L (ref 0.5–1.9)
Lactic Acid, Venous: 1.9 mmol/L (ref 0.5–1.9)

## 2021-05-10 LAB — CBG MONITORING, ED: Glucose-Capillary: 377 mg/dL — ABNORMAL HIGH (ref 70–99)

## 2021-05-10 LAB — PROCALCITONIN: Procalcitonin: 0.66 ng/mL

## 2021-05-10 LAB — APTT: aPTT: 31 seconds (ref 24–36)

## 2021-05-10 LAB — GLUCOSE, CAPILLARY
Glucose-Capillary: 356 mg/dL — ABNORMAL HIGH (ref 70–99)
Glucose-Capillary: 289 mg/dL — ABNORMAL HIGH (ref 70–99)
Glucose-Capillary: 282 mg/dL — ABNORMAL HIGH (ref 70–99)

## 2021-05-10 LAB — SEDIMENTATION RATE: Sed Rate: 53 mm/hr — ABNORMAL HIGH (ref 0–16)

## 2021-05-10 LAB — RESP PANEL BY RT-PCR (FLU A&B, COVID) ARPGX2
Influenza A by PCR: NEGATIVE
Influenza B by PCR: NEGATIVE
SARS Coronavirus 2 by RT PCR: NEGATIVE

## 2021-05-10 LAB — TROPONIN I (HIGH SENSITIVITY)
Troponin I (High Sensitivity): 103 ng/L (ref <18)
Troponin I (High Sensitivity): 893 ng/L (ref <18)
Troponin I (High Sensitivity): 1326 ng/L (ref <18)

## 2021-05-10 LAB — BRAIN NATRIURETIC PEPTIDE: B Natriuretic Peptide: 301.8 pg/mL — ABNORMAL HIGH (ref 0.0–100.0)

## 2021-05-10 LAB — MRSA NEXT GEN BY PCR, NASAL: MRSA by PCR Next Gen: NOT DETECTED

## 2021-05-10 LAB — PROTIME-INR
INR: 1.1 (ref 0.8–1.2)
Prothrombin Time: 13.7 seconds (ref 11.4–15.2)

## 2021-05-10 LAB — STREP PNEUMONIAE URINARY ANTIGEN: Strep Pneumo Urinary Antigen: NEGATIVE

## 2021-05-10 LAB — BETA-HYDROXYBUTYRIC ACID: Beta-Hydroxybutyric Acid: 0.2 mmol/L (ref 0.05–0.27)

## 2021-05-10 MED ORDER — INSULIN ASPART PROT & ASPART (70-30 MIX) 100 UNIT/ML ~~LOC~~ SUSP
15.0000 [IU] | Freq: Two times a day (BID) | SUBCUTANEOUS | Status: DC
  Administered 2021-05-10 – 2021-05-14 (×9): 15 [IU] via SUBCUTANEOUS
  Filled 2021-05-10 (×3): qty 10

## 2021-05-10 MED ORDER — VANCOMYCIN HCL IN DEXTROSE 1-5 GM/200ML-% IV SOLN
1000.0000 mg | Freq: Once | INTRAVENOUS | Status: AC
  Administered 2021-05-10: 1000 mg via INTRAVENOUS
  Filled 2021-05-10: qty 200

## 2021-05-10 MED ORDER — VANCOMYCIN HCL IN DEXTROSE 1-5 GM/200ML-% IV SOLN: 1000.0000 mg | Freq: Two times a day (BID) | INTRAVENOUS | Status: DC

## 2021-05-10 MED ORDER — SODIUM CHLORIDE 0.9 % IV SOLN
500.0000 mg | Freq: Every day | INTRAVENOUS | Status: DC
  Administered 2021-05-10 – 2021-05-11 (×2): 500 mg via INTRAVENOUS
  Filled 2021-05-10 (×3): qty 500

## 2021-05-10 MED ORDER — LACTATED RINGERS IV BOLUS
1000.0000 mL | Freq: Once | INTRAVENOUS | Status: AC
  Administered 2021-05-10: 1000 mL via INTRAVENOUS

## 2021-05-10 MED ORDER — IPRATROPIUM-ALBUTEROL 0.5-2.5 (3) MG/3ML IN SOLN
3.0000 mL | RESPIRATORY_TRACT | Status: DC | PRN
  Administered 2021-05-10: 3 mL via RESPIRATORY_TRACT

## 2021-05-10 MED ORDER — IOHEXOL 350 MG/ML SOLN
100.0000 mL | Freq: Once | INTRAVENOUS | Status: AC | PRN
  Administered 2021-05-10: 100 mL via INTRAVENOUS

## 2021-05-10 MED ORDER — HEPARIN SODIUM (PORCINE) 5000 UNIT/ML IJ SOLN
5000.0000 [IU] | Freq: Three times a day (TID) | INTRAMUSCULAR | Status: DC
  Administered 2021-05-10 – 2021-05-14 (×12): 5000 [IU] via SUBCUTANEOUS
  Filled 2021-05-10 (×12): qty 1

## 2021-05-10 MED ORDER — METRONIDAZOLE 500 MG/100ML IV SOLN
500.0000 mg | Freq: Once | INTRAVENOUS | Status: AC
  Administered 2021-05-10: 500 mg via INTRAVENOUS
  Filled 2021-05-10: qty 100

## 2021-05-10 MED ORDER — ORAL CARE MOUTH RINSE: 15.0000 mL | Freq: Two times a day (BID) | OROMUCOSAL | Status: DC

## 2021-05-10 MED ORDER — CHLORHEXIDINE GLUCONATE 0.12 % MT SOLN
15.0000 mL | Freq: Two times a day (BID) | OROMUCOSAL | Status: DC
  Filled 2021-05-10 (×8): qty 15

## 2021-05-10 MED ORDER — VANCOMYCIN HCL IN DEXTROSE 1-5 GM/200ML-% IV SOLN
1000.0000 mg | Freq: Once | INTRAVENOUS | Status: DC
  Filled 2021-05-10: qty 200

## 2021-05-10 MED ORDER — CLONIDINE HCL 0.1 MG PO TABS
0.1000 mg | ORAL_TABLET | Freq: Three times a day (TID) | ORAL | Status: DC
  Administered 2021-05-10: 0.1 mg via ORAL
  Filled 2021-05-10: qty 1

## 2021-05-10 MED ORDER — LACTATED RINGERS IV SOLN: INTRAVENOUS | Status: AC

## 2021-05-10 MED ORDER — ACETAMINOPHEN 325 MG PO TABS
650.0000 mg | ORAL_TABLET | Freq: Four times a day (QID) | ORAL | Status: DC | PRN
  Administered 2021-05-10 – 2021-05-14 (×4): 650 mg via ORAL
  Filled 2021-05-10 (×4): qty 2

## 2021-05-10 MED ORDER — METOPROLOL TARTRATE 25 MG PO TABS
25.0000 mg | ORAL_TABLET | Freq: Four times a day (QID) | ORAL | Status: DC
Start: 2021-05-10 — End: 2021-05-11
  Administered 2021-05-10 – 2021-05-11 (×3): 25 mg via ORAL
  Filled 2021-05-10 (×3): qty 1

## 2021-05-10 MED ORDER — SODIUM CHLORIDE 0.9 % IV SOLN
2.0000 g | Freq: Once | INTRAVENOUS | Status: AC
  Administered 2021-05-10: 2 g via INTRAVENOUS
  Filled 2021-05-10: qty 2

## 2021-05-10 MED ORDER — METRONIDAZOLE 500 MG/100ML IV SOLN: 500.0000 mg | Freq: Once | INTRAVENOUS | Status: DC

## 2021-05-10 MED ORDER — VANCOMYCIN HCL IN DEXTROSE 1-5 GM/200ML-% IV SOLN: 1000.0000 mg | Freq: Once | INTRAVENOUS | Status: DC

## 2021-05-10 MED ORDER — FUROSEMIDE 10 MG/ML IJ SOLN
60.0000 mg | Freq: Once | INTRAMUSCULAR | Status: DC
  Filled 2021-05-10: qty 6

## 2021-05-10 MED ORDER — CHLORHEXIDINE GLUCONATE CLOTH 2 % EX PADS
6.0000 | MEDICATED_PAD | Freq: Every day | CUTANEOUS | Status: DC
  Administered 2021-05-10 – 2021-05-11 (×2): 6 via TOPICAL

## 2021-05-10 MED ORDER — NITROGLYCERIN IN D5W 200-5 MCG/ML-% IV SOLN
0.0000 ug/min | INTRAVENOUS | Status: DC
  Administered 2021-05-10: 50 ug/min via INTRAVENOUS
  Filled 2021-05-10: qty 250

## 2021-05-10 MED ORDER — SODIUM CHLORIDE 0.9 % IV SOLN
INTRAVENOUS | Status: DC | PRN
  Administered 2021-05-10: 250 mL via INTRAVENOUS

## 2021-05-10 MED ORDER — SODIUM CHLORIDE 0.9 % IV SOLN
2.0000 g | Freq: Three times a day (TID) | INTRAVENOUS | Status: DC
  Administered 2021-05-10 – 2021-05-12 (×6): 2 g via INTRAVENOUS
  Filled 2021-05-10 (×6): qty 2

## 2021-05-10 MED ORDER — ASPIRIN EC 81 MG PO TBEC
81.0000 mg | DELAYED_RELEASE_TABLET | Freq: Every day | ORAL | Status: DC
  Administered 2021-05-10 – 2021-05-14 (×5): 81 mg via ORAL
  Filled 2021-05-10 (×5): qty 1

## 2021-05-10 MED ORDER — METRONIDAZOLE 500 MG/100ML IV SOLN: 500.0000 mg | Freq: Two times a day (BID) | INTRAVENOUS | Status: DC

## 2021-05-10 MED ORDER — VANCOMYCIN HCL 1750 MG/350ML IV SOLN
1750.0000 mg | INTRAVENOUS | Status: DC
  Filled 2021-05-10: qty 350

## 2021-05-10 MED ORDER — PANTOPRAZOLE SODIUM 40 MG PO TBEC
40.0000 mg | DELAYED_RELEASE_TABLET | Freq: Two times a day (BID) | ORAL | Status: DC
  Administered 2021-05-10 – 2021-05-14 (×8): 40 mg via ORAL
  Filled 2021-05-10 (×8): qty 1

## 2021-05-10 MED ORDER — CLONIDINE HCL 0.1 MG PO TABS
0.1000 mg | ORAL_TABLET | Freq: Four times a day (QID) | ORAL | Status: DC
  Administered 2021-05-10 – 2021-05-11 (×3): 0.1 mg via ORAL
  Filled 2021-05-10 (×3): qty 1

## 2021-05-10 NOTE — Sepsis Progress Note (Signed)
Secure chat with provider regarding fluid bolus per sepsis protocol. Provider is being cautious with fluids to help prevent overload. An additional liter bolus ordered.
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
## 2021-05-10 NOTE — ED Notes (Signed)
Patient transported to CT 

## 2021-05-10 NOTE — ED Notes (Signed)
[facility] adds the liter of LR ordered by Dr. Renaye Rakers.

## 2021-05-10 NOTE — Progress Notes (Signed)
Received PT on BiPAP from [facility]. [facility] removed PT from BiPAP per RT request. RT left PT off BiPAP to evaluate condition and for bedside RN, CNA to interview PT and complete full body inspection and cleaning. PT states he is breathing good at this time (chest a little heavy) and agrees to notify staff if breathing changes.BiPAP is in room and set up if / when needed.

## 2021-05-10 NOTE — ED Notes (Signed)
Our efforts thus far have been to place him on bi-pap and place IV's and draw and send labs. Lasix was not given, and is being held per order of Dr. Renaye Rakers. As I write this, I have just placed a temperature-sensing foley which returns clear yellow urine.

## 2021-05-10 NOTE — ED Provider Notes (Signed)
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 for transport; paramedics will do so. [MT]    Clinical Course User Index [MT] Terald Sleeper, MD     Final Clinical Impression(s) / ED Diagnoses Final diagnoses:  Sepsis, due to unspecified organism, unspecified whether acute organ dysfunction present [facility])  Multifocal pneumonia  Acute respiratory failure with hypoxia [facility])    Rx / DC Orders ED Discharge Orders     None        Terald Sleeper, MD 05/10/21 1056

## 2021-05-10 NOTE — ED Notes (Signed)
He remains stable and comfortable on the bi-pap. He is alert and oriented x 4. His significant other is aware of his tx to [facility]. [facility] leaves our facility with him at this time.

## 2021-05-10 NOTE — ED Notes (Signed)
Pgd Dr Sherene Sires [phone]

## 2021-05-10 NOTE — ED Notes (Signed)
We have just returned from CT, which he tolerated well. V.S. are better, and he tells [name] he is breathing "easier".

## 2021-05-10 NOTE — Progress Notes (Signed)
Pt currently on [place] and tolerating well.  Pt has no increase WOB at this time, BIPAP not indicated.

## 2021-05-10 NOTE — Sepsis Progress Note (Signed)
Sepsis protocol is being followed by [facility]. 

## 2021-05-10 NOTE — Consult Note (Signed)
WOC Nurse ostomy consult note Patient receiving care in [facility] 1240 This patient seen previous admission by the [facility] team. This consult completed remotely after review of chart and previous history. Stoma type/location: LLQ loop colostomy Stomal assessment/size: Not measured today Treatment options for stomal/peristomal skin: Barrier ring Ostomy pouching: 1pc. Flex convex Education provided: None Enrolled patient in [employer] DC program: Yes (Previously) Orders for ostomy care have been added to the chart with [facility] #s for supplies.  Thank you for the consult. WOC nurse will not follow at this time.   Please re-consult the WOC team if needed.  Renaldo Reel Katrinka Blazing, MSN, RN, CMSRN, Angus Seller, [facility] Wound Treatment Associate Pager [phone]

## 2021-05-10 NOTE — Progress Notes (Signed)
[facility] Physician-Brief Progress Note Patient Name: Andres Richardson DOB: 09/14/1965 MRN: [id number]   Date of Service  05/10/2021  HPI/Events of Note  Received request for prn tylenol order for fever. Pt on vancomycin and cefepime and was cultured today  eICU Interventions  Tylenol prn ordered     Intervention Category Minor Interventions: Routine modifications to care plan (e.g. PRN medications for pain, fever)  Rosalie Gums Carley Glendenning 05/10/2021, 10:05 PM

## 2021-05-10 NOTE — Progress Notes (Addendum)
Pharmacy Antibiotic Note  Andres Richardson is a 55 y.o. male admitted on 05/10/2021 with respiratory distress.  Pharmacy has been consulted for vancomycin and cefepime dosing for intra-abdominal infection.  Patient has a recent history of Fournier's gangrene.   SCr 1.1, hypothermic, WBC 18.8, LA 8.  Plan: Vanc 2gm IV x 1, then 1750mg  IV Q24H for AUC 495 using SCr 1.1 Cefepime 2gm IV Q8H Monitor renal fxn, clinical progress, vanc levels as indicated  Height: 5\' 11"  (180.3 cm) Weight: 99 kg (218 lb 4.1 oz) IBW/kg (Calculated) : 75.3  Temp (24hrs), Avg:97.4 F (36.3 C), Min:97.4 F (36.3 C), Max:97.4 F (36.3 C)  Recent Labs  Lab 05/10/21 0808  WBC 18.8*  CREATININE 1.10  LATICACIDVEN 8.0*    Estimated Creatinine Clearance: 91 mL/min (by C-G formula based on SCr of 1.1 mg/dL).    Allergies  Allergen Reactions   Adhesive [Tape] Rash    Vanc 9/25 >> Cefepime 9/25 >> Flagyl x1 9/25   9/25 BCx -   Tevion Laforge D. 10/25, PharmD, BCPS, BCCCP 05/10/2021, 9:09 AM   ===================  Addendum: Change vanc to 1gm IV Q12H since this dose is available at [facility] D. 05/12/2021, PharmD, BCPS, BCCCP 05/10/2021, 9:15 AM

## 2021-05-10 NOTE — ED Triage Notes (Signed)
Onset of shortness of breath upon waking, Wet breath sounds throughout lung fields bil. Pt pale, diaphoretic, labored breathing.

## 2021-05-10 NOTE — H&P (Signed)
NAME:  Andres Richardson, MRN:  [id number], DOB:  1965/08/22, LOS: 0 ADMISSION DATE:  05/10/2021, CONSULTATION DATE:  9/25  REFERRING MD:  Corliss Skains ER, CHIEF COMPLAINT:  sob  N and V    History of Present Illness:  45 yowm former smoker/ never vax for covid/  with hbp /dm complicated by recent fournier's no prior resp dz abruptly developed sob with cough N and V am 9/25 with patchy as dz on CTa and mild DKA on w/u  - initially place on bipap by EDP at [facility] and tx to [facility] late morning 9/25 to PCCM service   Pertinent  Medical History   55 y.o. male with history of type 1 diabetes on insulin, hypertension, hyperlipidemia, obesity, former tobacco use smoker, presented to ED with shortness of breath.  Patient ports he woke up  5 AM  9/25 with abrupt onset new problem of  difficulty breathing.    He said he felt nauseated and vomited a few times.  He denied fevers or chills.  He reports a mild chest pressure.   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  Denies abd pain, ongoing sob/ cough  - says appetite very good, no more nausea/ ostomy output nl   Admit date: 02/22/2021 Discharge date: 03/10/2021 Hospital Course: 45 yowm   with diabetes admitted with intractable nausea vomiting, DKA managed with insulin drip on 7/10.  Patient however demonstrated ongoing fever and vomiting CT abdomen showed perirectal abscess concerning for fournier gangrene - Surgery, Urology were consulted. Patient underwent debridement by general surgery and urology with "laparoscopic creation of loop sigmoid colostomy, laparoscopic lysis of adhesions, perineal exam under anesthesia" on 7/12. Patient colostomy started have some bowel activity 7/15.  Diet was advanced.  Antibiotic changed to Unasyn as culture growing strep.   Patient transferred to [facility] 7/15.  Continued on p.o.  antibiotics and twice daily dressing changes with the help of general surgery and wound care nurse and d/c home.    Discharge Diagnoses:  Fournier's gangrene: S/P debridement 7/12 and loop sigmoid colostomy by gen surgery 7/13.   -OR culture 7/12-w/ strep anginosis -rec 14 days of antibiotic course with IV Unasyn >> p.o. Augmentin on 7/27 -Twice daily dressing per surgery.  Family friend will help with ostomy care and twice daily dressing at home -[facility] RN ordered as well. -Outpatient follow-up with general surgery as above   Uncontrolled IDDM-2 with DKA, hyperglycemia and Fournier's gangrene: DKA resolved.  A1c 9.5%.  On glipizide, metformin and 70/30 at 20 units daily at home. Last Labs          Recent Labs  Lab 03/09/21 1621 03/09/21 2004 03/10/21 0002 03/10/21 0449 03/10/21 0721  GLUCAP 216* 263* 100* 172* 149*    -Continue home Novolin 70/30 at 15 units twice daily -Continue home metformin ER 500 mg daily -Continue home glipizide ER 5 mg daily -Added atorvastatin 20 mg daily   Debility/generalized weakness -HH PT/OT ordered.   KVQ:QVZDGLOV Hypokalemia: Resolved. Hypernatremia resolved Hypertension?  Normotensive.  Not on meds. Tobacco abuse continue nicotine patch.  Cessation advised.   Class I obesity Body mass index is 31.27 kg/m. Nutrition Problem: Increased nutrient needs Etiology: post-op healing, wound healing Signs/Symptoms: estimated needs Interventions: Boost Breeze, Juven, Prostat, MVI, Refer to RD note for recommendations   Micro studies: MRSA PCR Screen 9/25 neg Resp viral panel 9/25 neg  BC x 2  9/25 >>>    Significant Hospital Events: Including procedures, antibiotic start and stop dates in addition to other pertinent events   Transferred from [facility] to  [facility] ICU  9/25 CHEST CTA 1. No evidence of a pulmonary embolism 2. Patchy bilateral airspace opacities, greatest in the lower lobes, associated with small effusions and mediastinal and hilar  adenopathy. Suspect multifocal pneumonia.  9/25 ABDOMEN AND PELVIS CT 1. Soft tissue air along the perineum, mostly on the right extending to the right base of the penis, which is improved compare to the prior CT scan, and now with no evidence of an abscess. There are mild inflammatory changes along the right pelvic sidewall including thickening of the right obturator internus muscle and right pelvic and inguinal lymphadenopathy, which is similar to the prior CT.  Scheduled Meds:  chlorhexidine  15 mL Mouth Rinse BID   Chlorhexidine Gluconate Cloth  6 each Topical Daily   insulin aspart protamine- aspart  15 Units Subcutaneous BID WC   mouth rinse  15 mL Mouth Rinse q12n4p   Continuous Infusions:  ceFEPime (MAXIPIME) IV     lactated ringers     metronidazole     nitroGLYCERIN Stopped (05/10/21 0850)   vancomycin     vancomycin     PRN Meds:.    Interim History / Subjective:  Pleasant wm denies symptoms, wants to eat.   Objective   Blood pressure 133/65, pulse (!) 120, temperature 97.7 F (36.5 C), resp. rate 18, height 5\' 11"  (1.803 m), weight 99 kg, SpO2 92 %.    Vent Mode: BIPAP FiO2 (%):  [45 %-50 %] 45 % Set Rate:  [16 bmp] 16 bmp PEEP:  [6 cmH20] 6 cmH20 Pressure Support:  [12 cmH20] 12 cmH20  No intake or output data in the 24 hours ending 05/10/21 1218 Filed Weights   05/10/21 0811  Weight: 99 kg    Examination: Tmax  General: wm > stated age/ sats 92% on 2lpm NP  HENT: neck supple, no nodes  Lungs: with a few crackles in bases bilaterally  Cardiovascular: RRR no s3  Abdomen: soft / colostomy site looks good Extremities: warm s calf tenderness / edema Neuro: alert/ approp no deficits       Assessment & Plan:  1)  Acute hypoxemic/hypercarbic resp failure assoc with ? CAP but no evidence of a complication of Forniers >>> see sepsis  >>> no longer needing bipap > rx 02   2) Sepsis ? Etiology, only pos so far is CTa with neg covid need to cover for both HCAP  and CAP  rx maxepime (recent admit)  and vanc (until MRSA Screen back)  and zmax >>> PCT protocol/ await cultures   3) DM poor control but no evidence for DKA so just keep hydrrated and rx insulin     Best Practice (right click and "Reselect all SmartList Selections" daily)   Diet/type: NPO DVT prophylaxis: prophylactic heparin  GI prophylaxis: PPI Lines: N/A Foley:  N/A Code Status:  full code Last date of multidisciplinary goals of care discussion  - pending   Labs   CBC: Recent Labs  Lab 05/10/21 0808 05/10/21 0815  WBC 18.8*  --   NEUTROABS 9.6*  --   HGB 10.9* 12.6*  HCT 37.3* 37.0*  MCV 88.4  --   PLT 568*  --     Basic Metabolic Panel: Recent Labs  Lab 05/10/21 0808 05/10/21 0815  NA 139 142  K 3.7  3.5  CL 105  --   CO2 17*  --   GLUCOSE 436*  --   BUN 18  --   CREATININE 1.10  --   CALCIUM 8.5*  --    GFR: Estimated Creatinine Clearance: 91 mL/min (by C-G formula based on SCr of 1.1 mg/dL). Recent Labs  Lab 05/10/21 0808 05/10/21 1025  WBC 18.8*  --   LATICACIDVEN 8.0* 1.9    Liver Function Tests: Recent Labs  Lab 05/10/21 0808  AST 20  ALT 18  ALKPHOS 64  BILITOT 0.2*  PROT 7.4  ALBUMIN 3.6   No results for input(s): LIPASE, AMYLASE in the last 168 hours. No results for input(s): AMMONIA in the last 168 hours.  ABG    Component Value Date/Time   HCO3 17.9 (L) 05/10/2021 0815   TCO2 20 (L) 05/10/2021 0815   ACIDBASEDEF 12.0 (H) 05/10/2021 0815   O2SAT 76.0 05/10/2021 0815     Coagulation Profile: Recent Labs  Lab 05/10/21 0808  INR 1.1    Cardiac Enzymes: No results for input(s): CKTOTAL, CKMB, CKMBINDEX, TROPONINI in the last 168 hours.  HbA1C: Hgb A1c MFr Bld  Date/Time Value Ref Range Status  02/22/2021 04:38 PM 9.5 (H) 4.8 - 5.6 % Final    Comment:    (NOTE) Pre diabetes:          5.7%-6.4%  Diabetes:              >6.4%  Glycemic control for   <7.0% adults with diabetes   05/11/2019 04:45 PM 12.1 (H) 4.8  - 5.6 % Final    Comment:    (NOTE) Pre diabetes:          5.7%-6.4% Diabetes:              >6.4% Glycemic control for   <7.0% adults with diabetes     CBG: Recent Labs  Lab 05/10/21 1044 05/10/21 1144  GLUCAP 377* 356*      Past Medical History:  He,  has a past medical history of Diabetes mellitus without complication (HCC), Hyperlipidemia, Hypertension, Microalbuminuria, Obesity, Peripheral arterial disease (HCC), and Tobacco use disorder.   Surgical History:   Past Surgical History:  Procedure Laterality Date   INCISION AND DRAINAGE ABSCESS N/A 02/24/2021   Procedure: INCISION AND DRAINAGE PERINEUM;  Surgeon: Sheliah Hatch De Blanch, MD;  Location: [facility];  Service: General;  Laterality: N/A;   INCISION AND DRAINAGE ABSCESS N/A 02/25/2021   Procedure: REPEAT WASHOUT OF PERINEUM;  Surgeon: Sheliah Hatch De Blanch, MD;  Location: [facility];  Service: General;  Laterality: N/A;   LAPAROSCOPIC DIVERTED COLOSTOMY N/A 02/25/2021   Procedure: LAPAROSCOPIC DIVERTING SIGMOID COLOSTOMY;  Surgeon: [name], MD;  Location: [facility];  Service: General;  Laterality: N/A;     Social History:   reports that he quit smoking about 2 months ago. His smoking use included cigarettes. He smoked an average of 1 pack per day. He has never used smokeless tobacco. He reports that he does not drink alcohol and does not use drugs.   Family History:  His family history is not on file.   Allergies Allergies  Allergen Reactions   Adhesive [Tape] Rash     Home Medications  Prior to Admission medications   Medication Sig Start Date End Date Taking? Authorizing Provider          atorvastatin (LIPITOR) 20 MG tablet Take 1 tablet (20 mg total) by mouth daily. 03/10/21 09/06/21  Almon Hercules,  MD  glipiZIDE (GLUCOTROL XL) 5 MG 24 hr tablet Take 1 tablet (5 mg total) by mouth daily. 03/10/21   Almon Hercules, MD  Insulin Syringe-Needle U-100 (INSULIN SYRINGE .3CC/31GX5/16") 31G X 5/16" 0.3 ML MISC  Levemir 15 units twice a day Patient not taking: Reported on 02/22/2021 05/14/19   Glade Lloyd, MD  metFORMIN (GLUCOPHAGE-XR) 750 MG 24 hr tablet Take 1 tablet (750 mg total) by mouth 2 (two) times daily. 03/10/21   Almon Hercules, MD  Multiple Vitamin (MULTIVITAMIN WITH MINERALS) TABS tablet Take 1 tablet by mouth daily. 03/10/21   Almon Hercules, MD  insulin NPH-regular Human (NOVOLIN 70/30) (70-30) 100 UNIT/ML injection Inject 15 Units into the skin 2 (two) times daily with a meal. 03/10/21   Gonfa, Boyce Medici, MD  oxyCODONE (OXY IR/ROXICODONE) 5 MG immediate release tablet Take 1 tablet by mouth every 6 hours as needed for breakthrough pain. 03/10/21   Maczis, Elmer Sow, PA-C  pantoprazole (PROTONIX) 40 MG tablet Take 1 tablet (40 mg total) by mouth daily. Patient not taking: Reported on 02/22/2021 05/14/19 06/13/19  Glade Lloyd, MD      The patient is critically ill with multiple organ systems failure and requires high complexity decision making for assessment and support, frequent evaluation and titration of therapies, application of advanced monitoring technologies and extensive interpretation of multiple databases. Critical Care Time devoted to patient care services described in this note is 45 minutes.   Sandrea Hughs, MD Pulmonary and Critical Care Medicine [facility] Cell [phone]   After 7:00 pm call Elink  [phone]

## 2021-05-11 ENCOUNTER — Inpatient Hospital Stay (HOSPITAL_COMMUNITY): Payer: Self-pay

## 2021-05-11 DIAGNOSIS — R0602 Shortness of breath: Secondary | ICD-10-CM

## 2021-05-11 LAB — BASIC METABOLIC PANEL
Anion gap: 5 (ref 5–15)
BUN: 16 mg/dL (ref 6–20)
CO2: 24 mmol/L (ref 22–32)
Calcium: 8.1 mg/dL — ABNORMAL LOW (ref 8.9–10.3)
Chloride: 108 mmol/L (ref 98–111)
Creatinine, Ser: 0.85 mg/dL (ref 0.61–1.24)
GFR, Estimated: >60 mL/min (ref >60)
Glucose, Bld: 210 mg/dL — ABNORMAL HIGH (ref 70–99)
Potassium: 3.9 mmol/L (ref 3.5–5.1)
Sodium: 137 mmol/L (ref 135–145)

## 2021-05-11 LAB — GLUCOSE, CAPILLARY
Glucose-Capillary: 188 mg/dL — ABNORMAL HIGH (ref 70–99)
Glucose-Capillary: 209 mg/dL — ABNORMAL HIGH (ref 70–99)
Glucose-Capillary: 180 mg/dL — ABNORMAL HIGH (ref 70–99)
Glucose-Capillary: 141 mg/dL — ABNORMAL HIGH (ref 70–99)

## 2021-05-11 LAB — ECHOCARDIOGRAM COMPLETE
Area-P 1/2: 4.31 cm2
Calc EF: 54.5 %
Height: 71 in
S' Lateral: 4.2 cm
Single Plane A2C EF: 58.9 %
Single Plane A4C EF: 48.5 %
Weight: 3492.09 oz

## 2021-05-11 LAB — CBC
HCT: 30 % — ABNORMAL LOW (ref 39.0–52.0)
Hemoglobin: 9.2 g/dL — ABNORMAL LOW (ref 13.0–17.0)
MCH: 26.2 pg (ref 26.0–34.0)
MCHC: 30.7 g/dL (ref 30.0–36.0)
MCV: 85.5 fL (ref 80.0–100.0)
Platelets: 250 10*3/uL (ref 150–400)
RBC: 3.51 MIL/uL — ABNORMAL LOW (ref 4.22–5.81)
RDW: 16.5 % — ABNORMAL HIGH (ref 11.5–15.5)
WBC: 9.2 10*3/uL (ref 4.0–10.5)
nRBC: 0 % (ref 0.0–0.2)

## 2021-05-11 LAB — TROPONIN I (HIGH SENSITIVITY): Troponin I (High Sensitivity): 1359 ng/L (ref <18)

## 2021-05-11 LAB — SEDIMENTATION RATE: Sed Rate: 65 mm/hr — ABNORMAL HIGH (ref 0–16)

## 2021-05-11 MED ORDER — LACTATED RINGERS IV BOLUS
1000.0000 mL | Freq: Once | INTRAVENOUS | Status: AC
  Administered 2021-05-11: 1000 mL via INTRAVENOUS

## 2021-05-11 MED ORDER — PERFLUTREN LIPID MICROSPHERE
1.0000 mL | INTRAVENOUS | Status: AC | PRN
  Administered 2021-05-11: 2 mL via INTRAVENOUS
  Filled 2021-05-11: qty 10

## 2021-05-11 MED ORDER — INSULIN ASPART 100 UNIT/ML IJ SOLN
0.0000 [IU] | Freq: Three times a day (TID) | INTRAMUSCULAR | Status: DC
  Administered 2021-05-11: 2 [IU] via SUBCUTANEOUS
  Administered 2021-05-12: 3 [IU] via SUBCUTANEOUS
  Administered 2021-05-12 (×2): 2 [IU] via SUBCUTANEOUS
  Administered 2021-05-13: 1 [IU] via SUBCUTANEOUS
  Administered 2021-05-13: 5 [IU] via SUBCUTANEOUS
  Administered 2021-05-13: 9 [IU] via SUBCUTANEOUS
  Administered 2021-05-14: 5 [IU] via SUBCUTANEOUS
  Administered 2021-05-14: 2 [IU] via SUBCUTANEOUS

## 2021-05-11 MED ORDER — INSULIN ASPART 100 UNIT/ML IJ SOLN
0.0000 [IU] | Freq: Every day | INTRAMUSCULAR | Status: DC
  Administered 2021-05-12: 3 [IU] via SUBCUTANEOUS
  Administered 2021-05-13: 2 [IU] via SUBCUTANEOUS

## 2021-05-11 NOTE — Progress Notes (Signed)
Inpatient Diabetes Program Recommendations  AACE/ADA: New Consensus Statement on Inpatient Glycemic Control (2015)  Target Ranges:  Prepandial:   less than 140 mg/dL      Peak postprandial:   less than 180 mg/dL (1-2 hours)      Critically ill patients:  140 - 180 mg/dL   Lab Results  Component Value Date   GLUCAP 188 (H) 05/11/2021   HGBA1C 9.5 (H) 02/22/2021    Review of Glycemic Control  Diabetes history: DM Outpatient Diabetes medications: 70/30 insulin 15 units bid, Metformiin 750 mg bid, Glucotrol 5 mg qd Current orders for Inpatient glycemic control: 70/30 15 units bid  Inpatient Diabetes Program Recommendations:   -Add Novolog 0-9 units tid correction + 0-5 units hs Thank you, Darel Hong E. Lorelle Macaluso, RN, MSN, CDE  Diabetes Coordinator Inpatient Glycemic Control Team Team Pager [phone] (8am-5pm) 05/11/2021 11:58 AM

## 2021-05-11 NOTE — Progress Notes (Signed)
Pt arrived to 4W from ICU. Andres Richardson is A/Ox4, on RA, VSS. Oriented to room & call bell. Denies pain, n/v & SOB. SB assist from wheelchair to chair. Sister is at bedside. All questions & concerns addressed. Tele verification complete

## 2021-05-11 NOTE — Progress Notes (Signed)
NAME:  CORTEZ FLIPPEN, MRN:  [id number], DOB:  [birth year], LOS: 1 ADMISSION DATE:  05/10/2021, CONSULTATION DATE:  9/25  REFERRING MD:  [facility], CHIEF COMPLAINT:  sob  N and V    History of Present Illness:  55 yowm former smoker/ never vax for covid/  with hbp /dm complicated by recent fournier's no prior resp dz abruptly developed sob with cough N and V am 9/25 with patchy as dz on CTa and mild DKA on w/u  - initially place on bipap by EDP at [facility] and tx to [facility] late morning 9/25 to PCCM service   Pertinent  Medical History   55 y.o. male with history of type 1 diabetes on insulin, hypertension, hyperlipidemia, obesity, former tobacco use smoker, presented to ED with shortness of breath.  Patient ports he woke up  5 AM  9/25 with abrupt onset new problem of  difficulty breathing.    He said he felt nauseated and vomited a few times.  He denied fevers or chills.  He reports a mild chest pressure.   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  Denies abd pain, ongoing sob/ cough  - says appetite very good, no more nausea/ ostomy output nl      Significant Hospital Events: Including procedures, antibiotic start and stop dates in addition to other pertinent events   Transferred from [facility] to  [facility] ICU  9/25 CHEST CTA 1. No evidence of a pulmonary embolism 2. Patchy bilateral airspace opacities, greatest in the lower lobes, associated with small effusions and mediastinal and hilar adenopathy. Suspect multifocal pneumonia.  9/25 ABDOMEN AND PELVIS CT 1. Soft tissue air along the perineum, mostly on the right extending to the right base of the penis, which is improved compare to the prior CT scan, and now with no evidence of an abscess. There are mild inflammatory changes along the right pelvic sidewall including thickening of the right  obturator internus muscle and right pelvic and inguinal lymphadenopathy, which is similar to the prior CT. 9/26 off pressors, room air, BP dropped after BP meds, 1L LR bolus  Scheduled Meds:  aspirin EC  81 mg Oral Daily   chlorhexidine  15 mL Mouth Rinse BID   Chlorhexidine Gluconate Cloth  6 each Topical Daily   heparin injection (subcutaneous)  5,000 Units Subcutaneous Q8H   insulin aspart protamine- aspart  15 Units Subcutaneous BID WC   mouth rinse  15 mL Mouth Rinse q12n4p   pantoprazole  40 mg Oral BID AC   Continuous Infusions:  sodium chloride 10 mL/hr at 05/11/21 0711   azithromycin 500 mg (05/11/21 0921)   ceFEPime (MAXIPIME) IV Stopped (05/11/21 0912)   lactated ringers     PRN Meds:.    Interim History / Subjective:  No acute events overnight.  Off pressors.  Pleasant.  Blood pressures dropped after morning BP meds.  1 L LR given.  Objective   Blood pressure (!) 114/52, pulse 91, temperature 99.1 F (37.3 C), resp. rate 20, height 5\' 11"  (1.803 m), weight 99 kg, SpO2 97 %.        Intake/Output Summary (Last 24 hours) at 05/11/2021 1007 Last data filed at 05/11/2021 0924 Gross per 24 hour  Intake 2946.71 ml  Output 3100 ml  Net -153.29 ml   Filed Weights   05/10/21 0811  Weight: 99 kg  Examination: Tmax  General: Sitting up in chair, no acute distress HENT: neck supple, no nodes  Lungs: Clear, very distant sounds Cardiovascular: RRR no s3  Abdomen: soft / colostomy site looks clean dry and intact Extremities: 1+ edema bilaterally Neuro: Alert, no weakness, sensation intact  Assessment & Plan:  Acute hypoxemic and hypercarbic respiratory failure: Due to community-acquired pneumonia.  Now on room air. --Continue cefepime/azithromycin, consider de-escalation of cefepime to ceftriaxone once cultures result --NIPPV nightly  Sepsis due to community-acquired pneumonia:  -- Antibiotics as above  Diabetes: --Continue home insulin, sliding  scale  Elevated Bps: No home meds listed for anti-HTN. Bps soft after meds given 9/26. --stop metoprolol and clonidine --1L LR bolus  Suspect will transfer out of the ICU later today.  Best Practice (right click and "Reselect all SmartList Selections" daily)   Diet/type: Regular consistency (see orders)  DVT prophylaxis: prophylactic heparin  GI prophylaxis: PPI Lines: N/A Foley:  N/A Code Status:  full code Last date of multidisciplinary goals of care discussion  n/a  Labs   CBC: Recent Labs  Lab 05/10/21 0808 05/10/21 0815 05/11/21 0250  WBC 18.8*  --  9.2  NEUTROABS 9.6*  --   --   HGB 10.9* 12.6* 9.2*  HCT 37.3* 37.0* 30.0*  MCV 88.4  --  85.5  PLT 568*  --  250     Basic Metabolic Panel: Recent Labs  Lab 05/10/21 0808 05/10/21 0815 05/11/21 0250  NA 139 142 137  K 3.7 3.5 3.9  CL 105  --  108  CO2 17*  --  24  GLUCOSE 436*  --  210*  BUN 18  --  16  CREATININE 1.10  --  0.85  CALCIUM 8.5*  --  8.1*    GFR: Estimated Creatinine Clearance: 117.8 mL/min (by C-G formula based on SCr of 0.85 mg/dL). Recent Labs  Lab 05/10/21 0808 05/10/21 1025 05/10/21 1309 05/11/21 0250  PROCALCITON  --   --  0.66  --   WBC 18.8*  --   --  9.2  LATICACIDVEN 8.0* 1.9  --   --      Liver Function Tests: Recent Labs  Lab 05/10/21 0808  AST 20  ALT 18  ALKPHOS 64  BILITOT 0.2*  PROT 7.4  ALBUMIN 3.6    No results for input(s): LIPASE, AMYLASE in the last 168 hours. No results for input(s): AMMONIA in the last 168 hours.  ABG    Component Value Date/Time   HCO3 17.9 (L) 05/10/2021 0815   TCO2 20 (L) 05/10/2021 0815   ACIDBASEDEF 12.0 (H) 05/10/2021 0815   O2SAT 76.0 05/10/2021 0815      Coagulation Profile: Recent Labs  Lab 05/10/21 0808  INR 1.1     Cardiac Enzymes: No results for input(s): CKTOTAL, CKMB, CKMBINDEX, TROPONINI in the last 168 hours.  HbA1C: Hgb A1c MFr Bld  Date/Time Value Ref Range Status  02/22/2021 04:38 PM 9.5 (H)  4.8 - 5.6 % Final    Comment:    (NOTE) Pre diabetes:          5.7%-6.4%  Diabetes:              >6.4%  Glycemic control for   <7.0% adults with diabetes   05/11/2019 04:45 PM 12.1 (H) 4.8 - 5.6 % Final    Comment:    (NOTE) Pre diabetes:          5.7%-6.4% Diabetes:              >  6.4% Glycemic control for   <7.0% adults with diabetes     CBG: Recent Labs  Lab 05/10/21 1044 05/10/21 1144 05/10/21 1637 05/10/21 2130 05/11/21 0745  GLUCAP 377* 356* 289* 282* 188*       Past Medical History:  He,  has a past medical history of Diabetes mellitus without complication (HCC), Hyperlipidemia, Hypertension, Microalbuminuria, Obesity, Peripheral arterial disease (HCC), and Tobacco use disorder.   Surgical History:   Past Surgical History:  Procedure Laterality Date   INCISION AND DRAINAGE ABSCESS N/A 02/24/2021   Procedure: INCISION AND DRAINAGE PERINEUM;  Surgeon: Sheliah Hatch De Blanch, MD;  Location: [facility];  Service: General;  Laterality: N/A;   INCISION AND DRAINAGE ABSCESS N/A 02/25/2021   Procedure: REPEAT WASHOUT OF PERINEUM;  Surgeon: Sheliah Hatch De Blanch, MD;  Location: [facility];  Service: General;  Laterality: N/A;   LAPAROSCOPIC DIVERTED COLOSTOMY N/A 02/25/2021   Procedure: LAPAROSCOPIC DIVERTING SIGMOID COLOSTOMY;  Surgeon: [name], MD;  Location: [facility];  Service: General;  Laterality: N/A;     Social History:   reports that he quit smoking about 2 months ago. His smoking use included cigarettes. He smoked an average of 1 pack per day. He has never used smokeless tobacco. He reports that he does not drink alcohol and does not use drugs.   Family History:  His family history is not on file.   Allergies Allergies  Allergen Reactions   Adhesive [Tape] Rash     Home Medications  Prior to Admission medications   Medication Sig Start Date End Date Taking? Authorizing Provider          atorvastatin (LIPITOR) 20 MG tablet Take 1 tablet (20 mg  total) by mouth daily. 03/10/21 09/06/21  Almon Hercules, MD  glipiZIDE (GLUCOTROL XL) 5 MG 24 hr tablet Take 1 tablet (5 mg total) by mouth daily. 03/10/21   Almon Hercules, MD  Insulin Syringe-Needle U-100 (INSULIN SYRINGE .3CC/31GX5/16") 31G X 5/16" 0.3 ML MISC Levemir 15 units twice a day Patient not taking: Reported on 02/22/2021 05/14/19   Glade Lloyd, MD  metFORMIN (GLUCOPHAGE-XR) 750 MG 24 hr tablet Take 1 tablet (750 mg total) by mouth 2 (two) times daily. 03/10/21   Almon Hercules, MD  Multiple Vitamin (MULTIVITAMIN WITH MINERALS) TABS tablet Take 1 tablet by mouth daily. 03/10/21   Almon Hercules, MD  insulin NPH-regular Human (NOVOLIN 70/30) (70-30) 100 UNIT/ML injection Inject 15 Units into the skin 2 (two) times daily with a meal. 03/10/21   Gonfa, Boyce Medici, MD  oxyCODONE (OXY IR/ROXICODONE) 5 MG immediate release tablet Take 1 tablet by mouth every 6 hours as needed for breakthrough pain. 03/10/21   Maczis, Elmer Sow, PA-C  pantoprazole (PROTONIX) 40 MG tablet Take 1 tablet (40 mg total) by mouth daily. Patient not taking: Reported on 02/22/2021 05/14/19 06/13/19  Glade Lloyd, MD      Critical Care:  N/a  Karren Burly, MD See Loretha Stapler for contact info

## 2021-05-11 NOTE — Progress Notes (Signed)
PHARMACY NOTE -  Cefepime  Pharmacy has been assisting with dosing of cefepime for CAP. Dosage remains stable at 2g IV q8 hr and further renal adjustments per institutional Pharmacy antibiotic protocol  Pharmacy will sign off, following peripherally for culture results or dose adjustments. Please reconsult if a change in clinical status warrants re-evaluation of dosage.  Bernadene Person, PharmD, BCPS [phone] 05/11/2021, 2:37 PM

## 2021-05-12 ENCOUNTER — Other Ambulatory Visit: Payer: Self-pay

## 2021-05-12 DIAGNOSIS — J9600 Acute respiratory failure, unspecified whether with hypoxia or hypercapnia: Secondary | ICD-10-CM

## 2021-05-12 DIAGNOSIS — R6520 Severe sepsis without septic shock: Secondary | ICD-10-CM

## 2021-05-12 LAB — LEGIONELLA PNEUMOPHILA SEROGP 1 UR AG: L. pneumophila Serogp 1 Ur Ag: NEGATIVE

## 2021-05-12 LAB — GLUCOSE, CAPILLARY
Glucose-Capillary: 170 mg/dL — ABNORMAL HIGH (ref 70–99)
Glucose-Capillary: 184 mg/dL — ABNORMAL HIGH (ref 70–99)
Glucose-Capillary: 203 mg/dL — ABNORMAL HIGH (ref 70–99)
Glucose-Capillary: 269 mg/dL — ABNORMAL HIGH (ref 70–99)

## 2021-05-12 LAB — PATHOLOGIST SMEAR REVIEW

## 2021-05-12 MED ORDER — AMOXICILLIN-POT CLAVULANATE 875-125 MG PO TABS
1.0000 | ORAL_TABLET | Freq: Two times a day (BID) | ORAL | Status: DC
  Administered 2021-05-12 – 2021-05-14 (×4): 1 via ORAL
  Filled 2021-05-12 (×4): qty 1

## 2021-05-12 MED ORDER — AZITHROMYCIN 250 MG PO TABS
500.0000 mg | ORAL_TABLET | Freq: Every day | ORAL | Status: DC
  Administered 2021-05-12 – 2021-05-14 (×3): 500 mg via ORAL
  Filled 2021-05-12 (×3): qty 2

## 2021-05-12 NOTE — Evaluation (Signed)
Clinical/Bedside Swallow Evaluation Patient Details  Name: Andres Richardson MRN: [id number] Date of Birth: Dec 27, 1965  Today's Date: 05/12/2021 Time: SLP Start Time (ACUTE ONLY): 1200 SLP Stop Time (ACUTE ONLY): 1225 SLP Time Calculation (min) (ACUTE ONLY): 25 min  Past Medical History:  Past Medical History:  Diagnosis Date   Diabetes mellitus without complication (HCC)    Type !! 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 Diabetic neuropathy.   Hyperlipidemia    Mixed   Hypertension    Essential   Microalbuminuria    Obesity    Peripheral arterial disease (HCC)    With Claudication   Tobacco use disorder    Past Surgical History:  Past Surgical History:  Procedure Laterality Date   INCISION AND DRAINAGE ABSCESS N/A 02/24/2021   Procedure: INCISION AND DRAINAGE PERINEUM;  Surgeon: Sheliah Hatch De Blanch, MD;  Location: [facility];  Service: General;  Laterality: N/A;   INCISION AND DRAINAGE ABSCESS N/A 02/25/2021   Procedure: REPEAT WASHOUT OF PERINEUM;  Surgeon: [name], MD;  Location: [facility];  Service: General;  Laterality: N/A;   LAPAROSCOPIC DIVERTED COLOSTOMY N/A 02/25/2021   Procedure: LAPAROSCOPIC DIVERTING SIGMOID COLOSTOMY;  Surgeon: [name], MD;  Location: [facility];  Service: General;  Laterality: N/A;   HPI:  54yo male admitted 05/10/21 with SOB, cough, N/V related. PMH: DM1, HTN, HLD, obesity, former tobacco user. Recent hospitalization (July 2022) for Fournier's gangrene - surgical resection of perirectal abscess and infection - currently has an ostomy    Assessment / Plan / Recommendation  Clinical Impression  Pt presents with functional oral and pharyngeal swallow. Pt reports no history of dysphagia. He has upper dentures, edentulous lower. CN exam is unremarkable. Pt was observed during lunch including salad, [place], stuffing, and iced tea. No obvious oral issues observed, and no overt s/s aspiration noted. Recommend continuing with current  diet. Pt was encouraged to notify PCP if PNA becomes recurrent, as instrumental assessment may be beneficial. No further ST intervention recommended at this time. Please reconsult if needs arise.  SLP Visit Diagnosis: Dysphagia, unspecified (R13.10)    Aspiration Risk  Mild aspiration risk    Diet Recommendation Regular;Thin liquid   Liquid Administration via: Cup;Straw Medication Administration: Whole meds with liquid Supervision: Patient able to self feed Compensations: Slow rate;Small sips/bites Postural Changes: Seated upright at 90 degrees;Remain upright for at least 30 minutes after po intake    Other  Recommendations Oral Care Recommendations: Oral care BID    Recommendations for follow up therapy are one component of a multi-disciplinary discharge planning process, led by the attending physician.  Recommendations may be updated based on patient status, additional functional criteria and insurance authorization.  Follow up Recommendations None          Prognosis Prognosis for Safe Diet Advancement: Good      Swallow Study   General Date of Onset: 05/10/21 HPI: 55yo male admitted 05/10/21 with SOB, cough, N/V related. PMH: DM1, HTN, HLD, obesity, former tobacco user. Recent hospitalization (July 2022) for Fournier's gangrene - surgical resection of perirectal abscess and infection - currently has an ostomy Type of Study: Bedside Swallow Evaluation Previous Swallow Assessment: BSE 2020 - advance diet per MD Diet Prior to this Study: Regular;Thin liquids Temperature Spikes Noted: No Respiratory Status: Room air History of Recent Intubation: No Behavior/Cognition: Alert;Cooperative;Pleasant mood Oral Cavity Assessment: Within Functional Limits Oral Care Completed by SLP: No Oral Cavity - Dentition: Dentures, top Vision: Functional for self-feeding Self-Feeding Abilities: Able to feed self  Patient Positioning: Upright in chair Baseline Vocal Quality: Normal Volitional  Cough: Strong Volitional Swallow: Able to elicit    Oral/Motor/Sensory Function Overall Oral Motor/Sensory Function: Within functional limits   Ice Chips Ice chips: Not tested   Thin Liquid Thin Liquid: Within functional limits Presentation: Cup    Nectar Thick Nectar Thick Liquid: Not tested   Honey Thick Honey Thick Liquid: Not tested   Puree Puree: Within functional limits Presentation: Self Fed;Spoon   Solid     Solid: Within functional limits Presentation: Self Fed     Eleanna Theilen B. Murvin Natal, [facility], CCC-SLP Speech Language Pathologist Office: [phone]  Leigh Aurora 05/12/2021,12:37 PM

## 2021-05-12 NOTE — Progress Notes (Signed)
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 and  with an assumed right atrial pressure of 8 mmHg, the estimated right ventricular systolic pressure is 31.4 mmHg. Left Atrium: Left atrial size was normal in size. Right Atrium: Right atrial size was normal in size. Pericardium: There is no evidence of pericardial effusion. Mitral Valve: The mitral valve is grossly normal. Trivial mitral valve regurgitation. Tricuspid Valve: The tricuspid valve is grossly normal. Tricuspid valve regurgitation is trivial. Aortic Valve: The aortic valve was not well visualized. Aortic valve regurgitation is trivial. Pulmonic Valve: The pulmonic valve was normal in structure. Pulmonic valve regurgitation is not visualized. Aorta: The aortic root and ascending aorta are structurally normal, with no evidence of dilitation. Venous: The  inferior vena cava is dilated in size with greater than 50% respiratory variability, suggesting right atrial pressure of 8 mmHg. IAS/Shunts: No atrial level shunt detected by color flow Doppler.  LEFT VENTRICLE PLAX 2D LVIDd:         4.80 cm      Diastology LVIDs:         4.20 cm      LV e' medial:    5.98 cm/s LV PW:         1.20 cm  LV E/e' medial:  14.3 LV IVS:        1.60 cm      LV e' lateral:   8.49 cm/s LVOT diam:     2.60 cm      LV E/e' lateral: 10.1 LV SV:         95 LV SV Index:   43 LVOT Area:     5.31 cm                              3D Volume EF: LV Volumes (MOD)            3D EF:        44 % LV vol d, MOD A2C: 141.0 ml LV EDV:       151 ml LV vol d, MOD A4C: 129.0 ml LV ESV:       84 ml LV vol s, MOD A2C: 57.9 ml  LV SV:        67 ml LV vol s, MOD A4C: 66.4 ml LV SV MOD A2C:     83.1 ml LV SV MOD A4C:     129.0 ml LV SV MOD BP:      75.1 ml RIGHT VENTRICLE             IVC RV S prime:     12.90 cm/s  IVC diam: 2.50 cm TAPSE (M-mode): 1.9 cm LEFT ATRIUM             Index       RIGHT ATRIUM           Index LA diam:        3.00 cm 1.37 cm/m  RA Area:     16.00 cm LA Vol (A2C):   50.1 ml 22.89 ml/m RA Volume:   45.00 ml  20.56 ml/m LA Vol (A4C):   49.9 ml 22.80 ml/m LA Biplane Vol: 53.7 ml 24.54 ml/m  AORTIC VALVE LVOT Vmax:   92.20 cm/s LVOT Vmean:  63.500 cm/s LVOT VTI:    0.178 m  AORTA Ao Root diam: 3.80 cm MITRAL VALVE               TRICUSPID VALVE MV Area (PHT): 4.31 cm    TR Peak grad:   23.4 mmHg MV Decel Time: 176 msec    TR Vmax:        242.00 cm/s MV E velocity: 85.80 cm/s MV A velocity: 97.15 cm/s  SHUNTS MV E/A ratio:  0.88        Systemic VTI:  0.18 m                            Systemic Diam: 2.60 cm Zoila Shutter MD Electronically signed by Zoila Shutter MD Signature Date/Time: 05/11/2021/2:58:03 PM    Final     Scheduled Meds:  aspirin EC  81 mg Oral Daily   chlorhexidine  15 mL Mouth Rinse BID   heparin injection (subcutaneous)  5,000 Units Subcutaneous Q8H   insulin aspart   0-5 Units Subcutaneous QHS   insulin aspart  0-9 Units Subcutaneous TID WC   insulin aspart protamine- aspart  15 Units Subcutaneous BID WC   mouth rinse  15 mL Mouth Rinse q12n4p   pantoprazole  40 mg Oral BID AC   Continuous Infusions:  sodium chloride Stopped (05/11/21 1721)   azithromycin Stopped (05/11/21 1021)  ceFEPime (MAXIPIME) IV 2 g (05/12/21 0150)     LOS: 2 days   Time spent:  Azucena Fallen, DO [facility]  If 7PM-7AM, please contact night-coverage [email or web address]  05/12/2021, 7:44 AM

## 2021-05-12 NOTE — Plan of Care (Signed)
  Problem: Education: Goal: Knowledge of General Education information will improve Description: Including pain rating scale, medication(s)/side effects and non-pharmacologic comfort measures Outcome: Progressing   Problem: Clinical Measurements: Goal: Will remain free from infection Outcome: Progressing   Problem: Clinical Measurements: Goal: Diagnostic test results will improve Outcome: Progressing   Problem: Safety: Goal: Ability to remain free from injury will improve Outcome: Progressing   Problem: Fluid Volume: Goal: Hemodynamic stability will improve Outcome: Progressing

## 2021-05-13 ENCOUNTER — Inpatient Hospital Stay (HOSPITAL_COMMUNITY): Payer: Medicaid Other

## 2021-05-13 DIAGNOSIS — R778 Other specified abnormalities of plasma proteins: Secondary | ICD-10-CM

## 2021-05-13 DIAGNOSIS — M7989 Other specified soft tissue disorders: Secondary | ICD-10-CM

## 2021-05-13 DIAGNOSIS — M79602 Pain in left arm: Secondary | ICD-10-CM

## 2021-05-13 LAB — CBC
HCT: 29.5 % — ABNORMAL LOW (ref 39.0–52.0)
Hemoglobin: 8.8 g/dL — ABNORMAL LOW (ref 13.0–17.0)
MCH: 25.6 pg — ABNORMAL LOW (ref 26.0–34.0)
MCHC: 29.8 g/dL — ABNORMAL LOW (ref 30.0–36.0)
MCV: 85.8 fL (ref 80.0–100.0)
Platelets: 239 10*3/uL (ref 150–400)
RBC: 3.44 MIL/uL — ABNORMAL LOW (ref 4.22–5.81)
RDW: 16.5 % — ABNORMAL HIGH (ref 11.5–15.5)
WBC: 4.6 10*3/uL (ref 4.0–10.5)
nRBC: 0 % (ref 0.0–0.2)

## 2021-05-13 LAB — BASIC METABOLIC PANEL
Anion gap: 7 (ref 5–15)
BUN: 19 mg/dL (ref 6–20)
CO2: 21 mmol/L — ABNORMAL LOW (ref 22–32)
Calcium: 8.6 mg/dL — ABNORMAL LOW (ref 8.9–10.3)
Chloride: 114 mmol/L — ABNORMAL HIGH (ref 98–111)
Creatinine, Ser: 1.14 mg/dL (ref 0.61–1.24)
GFR, Estimated: >60 mL/min (ref >60)
Glucose, Bld: 316 mg/dL — ABNORMAL HIGH (ref 70–99)
Potassium: 3.9 mmol/L (ref 3.5–5.1)
Sodium: 142 mmol/L (ref 135–145)

## 2021-05-13 LAB — LIPID PANEL
Cholesterol: 111 mg/dL (ref 0–200)
HDL: 28 mg/dL — ABNORMAL LOW (ref >40)
LDL Cholesterol: 44 mg/dL (ref 0–99)
Total CHOL/HDL Ratio: 4 RATIO
Triglycerides: 197 mg/dL — ABNORMAL HIGH (ref <150)
VLDL: 39 mg/dL (ref 0–40)

## 2021-05-13 LAB — GLUCOSE, CAPILLARY
Glucose-Capillary: 269 mg/dL — ABNORMAL HIGH (ref 70–99)
Glucose-Capillary: 126 mg/dL — ABNORMAL HIGH (ref 70–99)
Glucose-Capillary: 359 mg/dL — ABNORMAL HIGH (ref 70–99)
Glucose-Capillary: 239 mg/dL — ABNORMAL HIGH (ref 70–99)

## 2021-05-13 MED ORDER — ATORVASTATIN CALCIUM 40 MG PO TABS
40.0000 mg | ORAL_TABLET | Freq: Every day | ORAL | Status: DC
  Administered 2021-05-13 – 2021-05-14 (×2): 40 mg via ORAL
  Filled 2021-05-13 (×2): qty 1

## 2021-05-13 NOTE — Progress Notes (Deleted)
Principal problem Acute hypoxemic and hypercarbic respiratory failure Aspiration pneumonia versus chemical pneumonitis, POA -Patient was admitted to the hospital with hypoxic respiratory failure possibly in the setting of aspiration pneumonia.  He was initially placed on broad-spectrum antibiotics and eventually de-escalated to Augmentin.  His hypoxia resolved and he is now stable on room air.   Active problems Sepsis secondary to above -De-escalate to Augmentin, will complete a course Elevated troponin secondary to above - Likely exacerbated by hypoxia and acute infection. Echo shows grade 1 diastolic dysfunction, hypokinesis of L ventricle and calcified 3 vessel disease.  Cardiology consulted   Insulin-dependent diabetes type 2: -Continue home insulin, sliding scale, moderately well controlled   Reactive hypertension, resolved -No noted Home meds, likely in the setting of above blood pressure currently within normal limits

## 2021-05-13 NOTE — Progress Notes (Signed)
LUE venous duplex has been completed.   Results can be found under chart review under CV PROC. 05/13/2021 10:45 AM Tyrail Grandfield RVT, RDMS

## 2021-05-13 NOTE — Consult Note (Addendum)
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 Cardiac silhouette is normal in size. No mediastinal or hilar masses. Irregular interstitial opacities are noted bilaterally. There is central vascular congestion. Patchy airspace opacities are noted in the lower lungs. Possible small effusions. No pneumothorax. Skeletal structures are grossly intact. IMPRESSION: 1. Irregular interstitial opacities, vascular congestion and patchy lower lung zone airspace opacities. Suspect pulmonary edema, although multifocal infection should be considered in the proper clinical setting. Electronically Signed   By: Amie Portland M.D.   On: 05/10/2021 08:41   ECHOCARDIOGRAM COMPLETE  Result Date: 05/11/2021    ECHOCARDIOGRAM REPORT   Patient Name:   Andres Richardson Date of Exam: 05/11/2021 Medical Rec #:  [id number]     Height:       71.0 in Accession #:    [id number]    Weight:       218.3 lb Date of Birth:  06-04-1966     BSA:          2.188 m Patient Age:    55 years      BP:           112/90 mmHg Patient Gender: M             HR:           88 bpm. Exam Location:  Inpatient Procedure:  2D Echo, 3D Echo,  Cardiac Doppler, Color Doppler and Intracardiac            Opacification Agent Indications:    R06.02 SOB  History:        Patient has no prior history of Echocardiogram examinations.                 Signs/Symptoms:Bacteremia, Dyspnea and Shortness of Breath; Risk                 Factors:Former Smoker, Dyslipidemia, Hypertension and Diabetes.  Sonographer:    Sheralyn Boatman RDCS Referring Phys: [phone] MATTHEW R HUNSUCKER  Sonographer Comments: Technically difficult study due to poor echo windows. IMPRESSIONS  1. Left ventricular ejection fraction, by estimation, is 50 to 55%. The left ventricle has low normal function. The left ventricle demonstrates regional wall motion abnormalities (see scoring diagram/findings for description). There is moderate left ventricular hypertrophy. Left ventricular diastolic parameters are consistent with Grade I diastolic dysfunction (impaired relaxation). There is moderate hypokinesis of the left ventricular, basal inferior wall and inferolateral wall.  2. Right ventricular systolic function is normal. The right ventricular size is normal. There is normal pulmonary artery systolic pressure. The estimated right ventricular systolic pressure is 31.4 mmHg.  3. The mitral valve is grossly normal. Trivial mitral valve regurgitation.  4. The aortic valve was not well visualized. Aortic valve regurgitation is trivial.  5. The inferior vena cava is dilated in size with >50% respiratory variability, suggesting right atrial pressure of 8 mmHg. Comparison(s): No prior Echocardiogram. FINDINGS  Left Ventricle: Left ventricular ejection fraction, by estimation, is 50 to 55%. The left ventricle has low normal function. The left ventricle demonstrates regional wall motion abnormalities. Moderate hypokinesis of the left ventricular, basal inferior  wall and inferolateral wall. Definity contrast agent was given IV to delineate the left ventricular endocardial borders. The left ventricular internal cavity  size was normal in size. There is moderate left ventricular hypertrophy. Left ventricular diastolic parameters are consistent with Grade I diastolic dysfunction (impaired relaxation). Indeterminate filling pressures. Right Ventricle: The right ventricular size is normal. No increase in right ventricular wall thickness. Right ventricular systolic function is normal. There is normal pulmonary artery systolic pressure. The tricuspid regurgitant velocity is 2.42 m/s, and  with an assumed right atrial pressure of 8 mmHg, the estimated right ventricular systolic pressure is 31.4 mmHg. Left Atrium: Left atrial size was normal in size. Right Atrium: Right atrial size was normal in size. Pericardium: There is no evidence of pericardial effusion. Mitral Valve: The mitral valve is grossly normal. Trivial mitral valve regurgitation. Tricuspid Valve: The tricuspid valve is grossly normal. Tricuspid valve regurgitation is trivial. Aortic Valve: The aortic valve was not well visualized. Aortic valve regurgitation is trivial. Pulmonic Valve: The pulmonic valve was normal in structure. Pulmonic valve regurgitation is not visualized. Aorta: The aortic root and ascending aorta are structurally normal, with no evidence of dilitation. Venous: The inferior vena cava is dilated in size with greater than 50% respiratory variability, suggesting right atrial pressure of 8 mmHg. IAS/Shunts: No atrial level shunt detected by color flow Doppler.  LEFT VENTRICLE PLAX 2D LVIDd:         4.80 cm      Diastology LVIDs:         4.20 cm      LV e' medial:    5.98 cm/s LV PW:         1.20 cm      LV E/e' medial:  14.3 LV IVS:        1.60 cm      LV e' lateral:   8.49 cm/s LVOT diam:     2.60 cm      LV E/e' lateral: 10.1 LV SV:         95 LV SV Index:   43 LVOT Area:     5.31 cm                              3D Volume EF: LV Volumes (MOD)            3D EF:        44 % LV vol d, MOD A2C: 141.0 ml LV EDV:       151 ml LV vol d, MOD A4C: 129.0 ml LV ESV:        84 ml LV vol s, MOD A2C: 57.9 ml  LV SV:        67 ml LV vol s, MOD A4C: 66.4 ml LV SV MOD A2C:     83.1 ml LV SV MOD A4C:     129.0 ml LV SV MOD BP:      75.1 ml RIGHT VENTRICLE             IVC RV S prime:     12.90 cm/s  IVC diam: 2.50 cm TAPSE (M-mode): 1.9 cm LEFT ATRIUM             Index       RIGHT ATRIUM           Index LA diam:        3.00 cm 1.37 cm/m  RA Area:     16.00 cm LA Vol (A2C):   50.1 ml 22.89 ml/m RA Volume:   45.00 ml  20.56 ml/m LA Vol (A4C):   49.9 ml 22.80 ml/m LA Biplane Vol: 53.7 ml 24.54 ml/m  AORTIC VALVE LVOT Vmax:   92.20 cm/s LVOT Vmean:  63.500 cm/s LVOT VTI:    0.178 m  AORTA Ao Root diam: 3.80 cm MITRAL VALVE               TRICUSPID VALVE MV Area (PHT): 4.31 cm    TR Peak grad:   23.4 mmHg MV Decel Time: 176 msec    TR Vmax:        242.00 cm/s MV E velocity: 85.80 cm/s MV A velocity: 97.15 cm/s  SHUNTS MV E/A ratio:  0.88        Systemic VTI:  0.18 m                            Systemic Diam: 2.60 cm Zoila Shutter MD Electronically signed by Zoila Shutter MD Signature Date/Time: 05/11/2021/2:58:03 PM    Final    VAS [name] UPPER EXTREMITY VENOUS DUPLEX  Result Date: 05/13/2021 UPPER VENOUS STUDY  Patient Name:  Andres Richardson  Date of Exam:   05/13/2021 Medical Rec #: [id number]        Accession #:    [id number] Date of Birth: Aug 04, 1966        Patient Gender: M Patient Age:   61 years Exam Location:  [facility] Procedure:      VAS [name] UPPER EXTREMITY VENOUS DUPLEX Referring Phys: Pamella Pert --------------------------------------------------------------------------------  Indications: Pain, and Swelling Risk Factors: Previous IV and blood draw in LUE. Comparison  Study: No previous exams Performing Technologist: Jody Hill RVT, RDMS  Examination Guidelines: A complete evaluation includes B-mode imaging, spectral Doppler, color Doppler, and power Doppler as needed of all accessible portions of each vessel. Bilateral testing is considered an integral part of a  complete examination. Limited examinations for reoccurring indications may be performed as noted.  Right Findings: +----------+------------+---------+-----------+----------+-------+ RIGHT     CompressiblePhasicitySpontaneousPropertiesSummary +----------+------------+---------+-----------+----------+-------+ Subclavian    Full       Yes       Yes                      +----------+------------+---------+-----------+----------+-------+  Left Findings: +----------+------------+---------+-----------+----------+-------+ LEFT      CompressiblePhasicitySpontaneousPropertiesSummary +----------+------------+---------+-----------+----------+-------+ IJV           Full       Yes       Yes                      +----------+------------+---------+-----------+----------+-------+ Subclavian    Full                                          +----------+------------+---------+-----------+----------+-------+ Axillary      Full       Yes       Yes                      +----------+------------+---------+-----------+----------+-------+ Brachial      Full       Yes       Yes                      +----------+------------+---------+-----------+----------+-------+ Radial        Full       Yes       Yes                      +----------+------------+---------+-----------+----------+-------+ Ulnar         Full                                          +----------+------------+---------+-----------+----------+-------+ Cephalic      Full                                          +----------+------------+---------+-----------+----------+-------+ Basilic       Full       Yes       Yes                      +----------+------------+---------+-----------+----------+-------+ SVT(other) in LUE mid and distal forearm.  Summary:  Right: No evidence of thrombosis in the subclavian.  Left: No evidence of deep vein thrombosis in the upper extremity. Acute SVT(other) in LUE mid and distal  forearm.  *See table(s) above for measurements and observations.    Preliminary      Assessment and Plan:   Non-STEMI CAD by non- coronary CT - HS trop 103 >893 >1326 >1359 - BNP 301 - EKG at admission 9/25 showed new onset minimal ST depression of lateral leads in the setting of sinus tachycardia - Repeat EKG today showed improved STD, T wave flattening noted  of I and aVL, non-specific changes  - Echo 05/11/21 showed Echocardiogram from 9/26 showed EF 50 to 55%, moderate LVH, grade 1 DD, moderate hypokinesis of LV, basal inferior wall, inferolateral wall, normal PASP with RVSP 31.4, trivial MR.  - None coronary CTA chest at admission showed three-vessel coronary artery calcifications - will check lipid panel /A1C today - Suspect patient has CAD, has significant risk factors including uncontrolled DM, HLD, obesity, former tobacco use, and family hx; will need ischemic evaluation, recommend outpatient NM stress Myovue in 1-2 weeks upon discharge versus inpatient coronary CTA (although HR limiting), he is not interested for cardiac cath yet, will defer to MD on final decision  - medical therapy: continue ASA 81mg  daily, will increase home med lipitor to 40mg  daily  - needs optimal control of DM  - arranged follow up with cardiology on 05/21/21   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 and also infrainguinal arterial occlusive disease on the right, followed by Dr Edilia Bo in the past, opted conservative management with improved symptoms of claudication on smoking cessation  Insulin dependent DM with hyperglycemia - A1C 9.5% from 02/22/21, will repeat A1C today, appears chronically poorly controlled with recurrent DKA/HHS - consider testing autoantibodies for confirmation of type 1 versus 2 as patient is unclear of his diagnosis and records are inconsistent  - defer management to IM   Left arm mild edema - had IV access  before, no DVT on doppler today, consistent with superficial thrombophlebitis, not CHF related   Acute hypoxic respiratory failure, resolved  Septic shock resolved  Aspiration pneumonia Normocytic anemia Elevated blood pressure Chronic venous insufficiency  - managed per IM    Risk Assessment/Risk Scores:    TIMI Risk Score for Unstable Angina or Non-ST Elevation MI:   The patient's TIMI risk score is 4, which indicates a 20% risk of all cause mortality, new or recurrent myocardial infarction or need for urgent revascularization in the next 14 days.{    For questions or updates, please contact [facility] Please consult [email or web address] for contact info under    Signed, Cyndi Bender, NP  05/13/2021 11:50 AM  Patient seen and examined with Cyndi Bender NP.  Agree as above, with the following exceptions and changes as noted below.  55 year old male with a history of likely type 1 diabetes, tobacco use quit in July, hyperlipidemia, obesity, recent hospitalization for DKA and Fournier's gangrene who presented to the ER 05/10/2021 with shortness of breath, nausea and vomiting after eating food and hypotension, felt to be septic with acute hypoxic hypercarbic respiratory failure from a community-acquired pneumonia.  Cardiology consulted for elevated troponin in the setting of this acute illness with new wall motion abnormality on echo and evidence of significant proximal LAD coronary artery calcifications on non-gated CTA.  Gen: NAD, CV: RRR, no murmurs, Lungs: clear, Abd: soft, Extrem: Warm, well perfused, no edema, Neuro/Psych: alert and oriented x 3, normal mood and affect. All available labs, radiology testing, previous records reviewed.  Patient tells me he has a strong family history of coronary artery disease and that all his family members have done poorly after cardiovascular procedures and interventions.  We discussed that it is likely that his family all had premature CAD from aggressive  diabetes with suboptimal control, leading to advanced coronary artery disease.  He has demonstrated a picture of demand ischemia with an elevated troponin during acute respiratory failure, however with a wall motion abnormality on  echo we reviewed that ischemic work-up inpatient certainly warranted.  Could consider outpatient work-up patient prefers.  He would like to sleep on this decision and let us know tomorrow.  No strong indication for stress testing at this time since the patient is already demonstrated a demand phenomenon with elevated troponin in the setting of stress.  I do not think stress testing would help [name] decide whether to proceed to cath particularly with the degree of proximal coronary artery calcifications in the left system.  Coronary angiography could be diagnostic only to evaluate for burden of disease, no intervention required if no definite culprit lesion found.    Parke Poisson, MD 05/13/21 4:21 PM

## 2021-05-13 NOTE — Progress Notes (Signed)
PROGRESS NOTE  Andres Richardson JXB:[id number] DOB: 10-15-1965 DOA: 05/10/2021 PCP: [name], MD   LOS: 3 days   Brief Narrative / Interim history: Patient 55 year old male former smoker, unvaccinated who presents with acute respiratory distress hypoxia hypotension. This woke him up from sleep, also reported chest pressure. He was admitted to the ICU and treated for presumed infectious pneumonia given suspicious CT findings for multifocal pneumonia on the CT on admission. Troponin was checked on admission and went from 103 as high as 1300. Cardiology consulted as well  Subjective / 24h Interval events: Doing well this morning, no chest pain. Breathing well.  Principal problem Acute hypoxemic and hypercarbic respiratory failure Aspiration pneumonia versus chemical pneumonitis, POA -Patient was admitted to the hospital with hypoxic respiratory failure possibly in the setting of aspiration pneumonia.  He was initially placed on broad-spectrum antibiotics and eventually de-escalated to Augmentin.  His hypoxia resolved and he is now stable on room air. -will complete a course of Augmenting. While his acute dyspnea may have been caused by a cardiac event, his WBC was elevated and procalcitonin marginally elevated as well   Active problems Sepsis secondary to above -De-escalate to Augmentin, will complete a course  Elevated troponin - possibly exacerbated by hypoxia and acute infection but cannot exclude a cardiac event given hypokinesis and underlying calcified coronaries. Echo shows grade 1 diastolic dysfunction, hypokinesis of L ventricle and calcified 3 vessel disease.  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 He is supposed to get ostomy reversal in November.  Scheduled Meds:  amoxicillin-clavulanate  1 tablet Oral Q12H   aspirin EC  81 mg Oral Daily   atorvastatin  40 mg Oral Daily   azithromycin  500 mg Oral Daily   chlorhexidine  15 mL Mouth Rinse BID   heparin injection (subcutaneous)  5,000 Units Subcutaneous Q8H   insulin aspart  0-5 Units Subcutaneous QHS   insulin aspart  0-9 Units Subcutaneous TID WC   insulin aspart protamine- aspart  15 Units Subcutaneous BID WC   mouth rinse  15 mL Mouth Rinse q12n4p   pantoprazole  40 mg Oral BID AC   Continuous Infusions:  sodium chloride Stopped (05/11/21 1721)   PRN Meds:.sodium chloride, acetaminophen, ipratropium-albuterol  Diet Orders (From admission, onward)     Start     Ordered   05/10/21 1631  Diet Carb Modified Fluid consistency: Thin; Room service appropriate? Yes  Diet effective now       Question Answer Comment  Diet-HS Snack? Nothing   Calorie Level Medium 1600-2000   Fluid consistency: Thin   Room service appropriate? Yes      05/10/21 1630            DVT prophylaxis: heparin injection 5,000 Units Start: 05/10/21 1400     Code Status: Full Code  Family Communication: no family at bedside   Status is: Inpatient  Remains inpatient appropriate because:Inpatient level of care appropriate due to severity of illness  Dispo: The  patient is from: Home              Anticipated d/c is to: Home              Patient currently is not medically stable to d/c.   Difficult to place patient No   Level of care: Progressive  Consultants:  PCCM Cardiology   Procedures:  None   Microbiology  None   Antimicrobials: Augmentin     Objective: Vitals:   05/12/21 1400  05/12/21 2101 05/13/21 0447 05/13/21 1538  BP: 126/71 (!) 145/71 (!) 155/73 (!) 150/86  Pulse: 95 98 99 (!) 103  Resp: 17 18  20   Temp: 98.3 F (36.8 C) 98.2 F (36.8 C) 98.3 F (36.8 C) 98.3 F (36.8 C)  TempSrc: Oral Oral  Oral  SpO2: 98% 100% 99% 98%  Weight:      Height:        Intake/Output Summary (Last 24 hours) at 05/13/2021 1542 Last data filed at 05/12/2021 2000 Gross per 24 hour  Intake 590 ml  Output --  Net 590 ml   Filed Weights   05/10/21 0811  Weight: 99 kg    Examination:  Constitutional: NAD Eyes: no scleral icterus ENMT: Mucous membranes are moist.  Neck: normal, supple Respiratory: clear to auscultation bilaterally, no wheezing, no crackles. Normal respiratory effort.  Cardiovascular: Regular rate and rhythm, no murmurs / rubs / gallops. No LE edema.  Abdomen: non distended, no tenderness. Bowel sounds positive.  Musculoskeletal: no clubbing / cyanosis.  Skin: no rashes Neurologic: CN 2-12 grossly intact. Strength 5/5 in all 4.  Psychiatric: Normal judgment and insight. Alert and oriented x 3. Normal mood.   Data Reviewed: I have independently reviewed following labs and imaging studies   CBC: Recent Labs  Lab 05/10/21 0808 05/10/21 0815 05/11/21 0250 05/13/21 0407  WBC 18.8*  --  9.2 4.6  NEUTROABS 9.6*  --   --   --   HGB 10.9* 12.6* 9.2* 8.8*  HCT 37.3* 37.0* 30.0* 29.5*  MCV 88.4  --  85.5 85.8  PLT 568*  --  250 239   Basic Metabolic Panel: Recent Labs  Lab 05/10/21 0808 05/10/21 0815 05/11/21 0250 05/13/21 0407  NA 139 142 137 142  K 3.7 3.5 3.9 3.9  CL 105  --  108 114*  CO2 17*  --  24 21*  GLUCOSE 436*  --  210* 316*  BUN 18  --  16 19  CREATININE 1.10  --  0.85 1.14  CALCIUM 8.5*  --  8.1* 8.6*   Liver Function Tests: Recent Labs  Lab 05/10/21 0808  AST 20  ALT 18  ALKPHOS 64  BILITOT 0.2*  PROT 7.4  ALBUMIN 3.6   Coagulation Profile: Recent Labs  Lab 05/10/21 0808  INR 1.1   HbA1C: No results for  input(s): HGBA1C in the last 72 hours. CBG: Recent Labs  Lab 05/12/21 1116 05/12/21 1622 05/12/21 2103 05/13/21 0803 05/13/21 1147  GLUCAP 184* 203* 269* 269* 126*    Recent Results (from the past 240 hour(s))  Blood Culture (routine x 2)     Status: None (Preliminary result)   Collection Time: 05/10/21  8:00 AM   Specimen: BLOOD  Result Value Ref Range Status   Specimen Description   Final    BLOOD LEFT UPPER ARM Performed at [facility], [address] [place], [place]    Special Requests   Final    BOTTLES  DRAWN AEROBIC AND ANAEROBIC Blood Culture adequate volume Performed at [facility], [address], [place], [place] [address]    Culture   Final    NO GROWTH 3 DAYS Performed at [facility], [address], [place], [place] [address]    Report Status PENDING  Incomplete  Blood Culture (routine x 2)     Status: None (Preliminary result)   Collection Time: 05/10/21  8:07 AM   Specimen: BLOOD RIGHT HAND  Result Value Ref Range Status   Specimen Description   Final    BLOOD RIGHT HAND Performed at [facility], [address], [place], [place] [address]    Special Requests   Final    BOTTLES DRAWN AEROBIC AND ANAEROBIC Blood Culture adequate volume Performed at [facility], [address], [place], [place] [address]    Culture   Final    NO GROWTH 3 DAYS Performed at [facility], [address] [place], [place] [address]    Report Status PENDING  Incomplete  Resp Panel by RT-PCR (Flu A&B, Covid) Nasopharyngeal Swab     Status: None   Collection Time: 05/10/21 10:25 AM   Specimen: Nasopharyngeal Swab; Nasopharyngeal(NP) swabs in vial transport medium  Result Value Ref Range Status   SARS Coronavirus 2 by RT PCR NEGATIVE NEGATIVE Final    Comment: (NOTE) SARS-CoV-2 target nucleic acids are NOT DETECTED.  The SARS-CoV-2 RNA is generally detectable in upper  respiratory specimens during the acute phase of infection. The lowest concentration of SARS-CoV-2 viral copies this assay can detect is 138 copies/mL. A negative result does not preclude SARS-Cov-2 infection and should not be used as the sole basis for treatment or other patient management decisions. A negative result may occur with  improper specimen collection/handling, submission of specimen other than nasopharyngeal swab, presence of viral mutation(s) within the areas targeted by this assay, and inadequate number of viral copies(<138 copies/mL). A negative result must be combined with clinical observations, patient history, and epidemiological information. The expected result is Negative.  Fact Sheet for Patients:  BloggerCourse.com  Fact Sheet for Healthcare Providers:  SeriousBroker.it  This test is no t yet approved or cleared by the [place] FDA and  has been authorized for detection and/or diagnosis of SARS-CoV-2 by FDA under an Emergency Use Authorization (EUA). This EUA will remain  in effect (meaning this test can be used) for the duration of the COVID-19 declaration under Section 564(b)(1) of the Act, 21 U.S.C.section 360bbb-3(b)(1), unless the authorization is terminated  or revoked sooner.       Influenza A by PCR NEGATIVE NEGATIVE Final   Influenza B by PCR NEGATIVE NEGATIVE Final    Comment: (NOTE) The Xpert Xpress SARS-CoV-2/FLU/RSV plus assay is intended as an aid in the diagnosis of influenza from Nasopharyngeal swab specimens and should not be used as a sole basis for treatment. Nasal washings and aspirates are unacceptable for Xpert Xpress SARS-CoV-2/FLU/RSV testing.  Fact Sheet for Patients: BloggerCourse.com  Fact Sheet for Healthcare Providers: SeriousBroker.it  This test is not yet approved or cleared by the [place] FDA and has been  authorized for detection and/or diagnosis of SARS-CoV-2 by FDA under an Emergency Use Authorization (EUA). This EUA will remain in effect (meaning this test can be used) for the duration of the COVID-19 declaration under Section 564(b)(1) of the Act, 21 U.S.C. section 360bbb-3(b)(1), unless the authorization is terminated or revoked.  Performed at [facility], [phone]  [address] [place], [place], [place] [address]   MRSA Next Gen by PCR, Nasal     Status: None   Collection Time: 05/10/21 11:51 AM   Specimen: Nasal Mucosa; Nasal Swab  Result Value Ref Range Status   MRSA by PCR Next Gen NOT DETECTED NOT DETECTED Final    Comment: (NOTE) The GeneXpert MRSA Assay (FDA approved for NASAL specimens only), is one component of a comprehensive MRSA colonization surveillance program. It is not intended to diagnose MRSA infection nor to guide or monitor treatment for MRSA infections. Test performance is not FDA approved in patients less than 42 years old. Performed at [facility], [address], [place], [place] [address]      Radiology Studies: VAS [name] UPPER EXTREMITY VENOUS DUPLEX  Result Date: 05/13/2021 UPPER VENOUS STUDY  Patient Name:  Andres Richardson  Date of Exam:   05/13/2021 Medical Rec #: [id number]        Accession #:    [id number] Date of Birth: May 16, 1966        Patient Gender: M Patient Age:   63 years Exam Location:  [facility] Procedure:      VAS [name] UPPER EXTREMITY VENOUS DUPLEX Referring Phys: Pamella Pert --------------------------------------------------------------------------------  Indications: Pain, and Swelling Risk Factors: Previous IV and blood draw in LUE. Comparison Study: No previous exams Performing Technologist: Jody Hill RVT, RDMS  Examination Guidelines: A complete evaluation includes B-mode imaging, spectral Doppler, color Doppler, and power Doppler as needed of all accessible portions of each vessel. Bilateral testing is  considered an integral part of a complete examination. Limited examinations for reoccurring indications may be performed as noted.  Right Findings: +----------+------------+---------+-----------+----------+-------+ RIGHT     CompressiblePhasicitySpontaneousPropertiesSummary +----------+------------+---------+-----------+----------+-------+ Subclavian    Full       Yes       Yes                      +----------+------------+---------+-----------+----------+-------+  Left Findings: +----------+------------+---------+-----------+----------+-------+ LEFT      CompressiblePhasicitySpontaneousPropertiesSummary +----------+------------+---------+-----------+----------+-------+ IJV           Full       Yes       Yes                      +----------+------------+---------+-----------+----------+-------+ Subclavian    Full                                          +----------+------------+---------+-----------+----------+-------+ Axillary      Full       Yes       Yes                      +----------+------------+---------+-----------+----------+-------+ Brachial      Full       Yes       Yes                      +----------+------------+---------+-----------+----------+-------+ Radial        Full       Yes       Yes                      +----------+------------+---------+-----------+----------+-------+ Ulnar         Full                                          +----------+------------+---------+-----------+----------+-------+  Cephalic      Full                                          +----------+------------+---------+-----------+----------+-------+ Basilic       Full       Yes       Yes                      +----------+------------+---------+-----------+----------+-------+ SVT(other) in LUE mid and distal forearm.  Summary:  Right: No evidence of thrombosis in the subclavian.  Left: No evidence of deep vein thrombosis in the upper extremity. Acute  SVT(other) in LUE mid and distal forearm.  *See table(s) above for measurements and observations.    Preliminary      Pamella Pert, MD, PhD [facility]  Between 7 am - 7 pm I am available, please contact me via Amion (for emergencies) or Securechat (non urgent messages)  Between 7 pm - 7 am I am not available, please contact night coverage MD/APP via Amion

## 2021-05-14 DIAGNOSIS — J9622 Acute and chronic respiratory failure with hypercapnia: Secondary | ICD-10-CM

## 2021-05-14 DIAGNOSIS — J9621 Acute and chronic respiratory failure with hypoxia: Secondary | ICD-10-CM

## 2021-05-14 LAB — GLUCOSE, CAPILLARY
Glucose-Capillary: 280 mg/dL — ABNORMAL HIGH (ref 70–99)
Glucose-Capillary: 183 mg/dL — ABNORMAL HIGH (ref 70–99)

## 2021-05-14 LAB — CBC
HCT: 28.9 % — ABNORMAL LOW (ref 39.0–52.0)
Hemoglobin: 8.8 g/dL — ABNORMAL LOW (ref 13.0–17.0)
MCH: 25.9 pg — ABNORMAL LOW (ref 26.0–34.0)
MCHC: 30.4 g/dL (ref 30.0–36.0)
MCV: 85 fL (ref 80.0–100.0)
Platelets: 232 10*3/uL (ref 150–400)
RBC: 3.4 MIL/uL — ABNORMAL LOW (ref 4.22–5.81)
RDW: 16.3 % — ABNORMAL HIGH (ref 11.5–15.5)
WBC: 4.7 10*3/uL (ref 4.0–10.5)
nRBC: 0 % (ref 0.0–0.2)

## 2021-05-14 LAB — HEMOGLOBIN A1C
Hgb A1c MFr Bld: 6.8 % — ABNORMAL HIGH (ref 4.8–5.6)
Mean Plasma Glucose: 148 mg/dL

## 2021-05-14 LAB — BASIC METABOLIC PANEL
Anion gap: 9 (ref 5–15)
BUN: 16 mg/dL (ref 6–20)
CO2: 22 mmol/L (ref 22–32)
Calcium: 8.3 mg/dL — ABNORMAL LOW (ref 8.9–10.3)
Chloride: 109 mmol/L (ref 98–111)
Creatinine, Ser: 1.01 mg/dL (ref 0.61–1.24)
GFR, Estimated: >60 mL/min (ref >60)
Glucose, Bld: 365 mg/dL — ABNORMAL HIGH (ref 70–99)
Potassium: 3.9 mmol/L (ref 3.5–5.1)
Sodium: 140 mmol/L (ref 135–145)

## 2021-05-14 MED ORDER — METOPROLOL SUCCINATE ER 25 MG PO TB24: 12.5000 mg | ORAL_TABLET | Freq: Every day | ORAL | 0 refills | Status: DC

## 2021-05-14 MED ORDER — METOPROLOL SUCCINATE ER 25 MG PO TB24
12.5000 mg | ORAL_TABLET | Freq: Every day | ORAL | Status: DC
  Administered 2021-05-14: 12.5 mg via ORAL
  Filled 2021-05-14: qty 1

## 2021-05-14 MED ORDER — ASPIRIN 81 MG PO TBEC: 81.0000 mg | DELAYED_RELEASE_TABLET | Freq: Every day | ORAL | 11 refills | Status: DC

## 2021-05-14 MED ORDER — AMOXICILLIN-POT CLAVULANATE 875-125 MG PO TABS: 1.0000 | ORAL_TABLET | Freq: Two times a day (BID) | ORAL | 0 refills | Status: DC

## 2021-05-14 MED ORDER — ATORVASTATIN CALCIUM 40 MG PO TABS: 40.0000 mg | ORAL_TABLET | Freq: Every day | ORAL | 0 refills | Status: DC

## 2021-05-14 NOTE — Progress Notes (Signed)
Patient discharged home.  IV removed - WNL.  Reviewed AVS and medications.  Follow up with cardio in place.  Patient verbalizes understanding of all DC instructions.  No questions at this time.  Assisted off unit via WC in NAD.

## 2021-05-14 NOTE — Plan of Care (Signed)
  Problem: Education: Goal: Knowledge of General Education information will improve Description: Including pain rating scale, medication(s)/side effects and non-pharmacologic comfort measures Outcome: Progressing   Problem: Activity: Goal: Risk for activity intolerance will decrease Outcome: Progressing   Problem: Coping: Goal: Level of anxiety will decrease Outcome: Progressing   

## 2021-05-14 NOTE — Discharge Summary (Signed)
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 microbiology, ancillary and laboratory) are listed below for reference.     Microbiology: Recent Results (from the past 240 hour(s))  Blood Culture (routine x 2)     Status: None (Preliminary result)   Collection Time: 05/10/21  8:00 AM   Specimen: BLOOD  Result Value Ref Range Status   Specimen Description   Final    BLOOD LEFT UPPER ARM Performed at [facility], [address] [place], [place]    Special Requests   Final    BOTTLES DRAWN AEROBIC AND ANAEROBIC Blood Culture adequate volume Performed at [facility], [address], [place], [place]    Culture   Final    NO GROWTH 3 DAYS Performed at [facility], [address] [place], [place]    Report Status PENDING  Incomplete  Blood Culture (routine x 2)     Status: None (Preliminary result)   Collection Time: 05/10/21  8:07 AM   Specimen: BLOOD RIGHT HAND  Result Value Ref Range Status   Specimen Description   Final    BLOOD RIGHT HAND Performed at [facility], [address] [place], [place]    Special  Requests   Final    BOTTLES DRAWN AEROBIC AND ANAEROBIC Blood Culture adequate volume Performed at [facility], [address] [place], [place]    Culture   Final    NO GROWTH 3 DAYS Performed at [facility], [address], [place], [place]    Report Status PENDING  Incomplete  Resp Panel by RT-PCR (Flu A&B, Covid) Nasopharyngeal Swab     Status: None   Collection Time: 05/10/21 10:25 AM   Specimen: Nasopharyngeal Swab; Nasopharyngeal(NP) swabs in vial transport medium  Result Value Ref Range Status   SARS Coronavirus 2 by RT PCR NEGATIVE NEGATIVE Final    Comment: (NOTE) SARS-CoV-2 target nucleic acids are NOT DETECTED.  The SARS-CoV-2 RNA is generally detectable in upper respiratory specimens during the acute phase of infection. The lowest concentration of SARS-CoV-2 viral copies this assay can detect is 138 copies/mL. A negative result does not preclude SARS-Cov-2 infection and should not be used as the sole basis for treatment or other patient management decisions. A negative result may occur with  improper specimen collection/handling, submission of specimen other than nasopharyngeal swab, presence of viral mutation(s) within the areas targeted by this assay, and inadequate number of viral copies(<138 copies/mL). A negative result must be combined with clinical observations, patient history, and epidemiological information. The expected result is Negative.  Fact Sheet for Patients:  05/12/21  Fact Sheet for Healthcare Providers:  BloggerCourse.com  This test is no t yet approved or cleared by the [place] FDA and  has been  authorized for detection and/or diagnosis of SARS-CoV-2 by FDA under an Emergency Use Authorization (EUA). This EUA will remain  in effect (meaning this test can be used) for the duration of the COVID-19 declaration under Section 564(b)(1) of  the Act, 21 U.S.C.section 360bbb-3(b)(1), unless the authorization is terminated  or revoked sooner.       Influenza A by PCR NEGATIVE NEGATIVE Final   Influenza B by PCR NEGATIVE NEGATIVE Final    Comment: (NOTE) The Xpert Xpress SARS-CoV-2/FLU/RSV plus assay is intended as an aid in the diagnosis of influenza from Nasopharyngeal swab specimens and should not be used as a sole basis for treatment. Nasal washings and aspirates are unacceptable for Xpert Xpress SARS-CoV-2/FLU/RSV testing.  Fact Sheet for Patients: BloggerCourse.com  Fact Sheet for Healthcare Providers: SeriousBroker.it  This test is not yet approved or cleared by the [place] FDA and has been authorized for detection and/or diagnosis of SARS-CoV-2 by FDA under an Emergency Use Authorization (EUA). This EUA will remain in effect (meaning this test can be used) for the duration of the COVID-19 declaration under Section 564(b)(1) of the Act, 21 U.S.C. section 360bbb-3(b)(1), unless the authorization is terminated or revoked.  Performed at [facility], [address], [place], [place] [address]   MRSA Next Gen by PCR, Nasal     Status: None   Collection Time: 05/10/21 11:51 AM   Specimen: Nasal Mucosa; Nasal Swab  Result Value Ref Range Status   MRSA by PCR Next Gen NOT DETECTED NOT DETECTED Final    Comment: (NOTE) The GeneXpert MRSA Assay (FDA approved for NASAL specimens only), is one component of a comprehensive MRSA colonization surveillance program. It is not intended to diagnose MRSA infection nor to guide or monitor treatment for MRSA infections. Test performance is not FDA approved in patients less than 31 years old. Performed at [facility], [address] [place], [place] [address]      Labs: Basic Metabolic Panel: Recent Labs  Lab 05/10/21 0808 05/10/21 0815 05/11/21 0250 05/13/21 0407  05/14/21 0343  NA 139 142 137 142 140  K 3.7 3.5 3.9 3.9 3.9  CL 105  --  108 114* 109  CO2 17*  --  24 21* 22  GLUCOSE 436*  --  210* 316* 365*  BUN 18  --  16 19 16   CREATININE 1.10  --  0.85 1.14 1.01  CALCIUM 8.5*  --  8.1* 8.6* 8.3*   Liver Function Tests: Recent Labs  Lab 05/10/21 0808  AST 20  ALT 18  ALKPHOS 64  BILITOT 0.2*  PROT 7.4  ALBUMIN 3.6   CBC: Recent Labs  Lab 05/10/21 0808 05/10/21 0815 05/11/21 0250 05/13/21 0407 05/14/21 0343  WBC 18.8*  --  9.2 4.6 4.7  NEUTROABS 9.6*  --   --   --   --   HGB 10.9* 12.6* 9.2* 8.8* 8.8*  HCT 37.3* 37.0* 30.0* 29.5* 28.9*  MCV 88.4  --  85.5 85.8 85.0  PLT 568*  --  250 239 232   CBG: Recent Labs  Lab 05/13/21 1147 05/13/21 1625 05/13/21 2048 05/14/21 0720 05/14/21 1124  GLUCAP 126* 359* 239* 280* 183*   Hgb A1c Recent Labs    05/13/21 0407  HGBA1C 6.8*   Lipid Profile Recent Labs    05/13/21 0407  CHOL 111  HDL 28*  LDLCALC 44  TRIG 05/15/21*  CHOLHDL 4.0   Thyroid function studies No results for input(s): TSH, T4TOTAL,  T3FREE, THYROIDAB in the last 72 hours.  Invalid input(s): FREET3 Urinalysis    Component Value Date/Time   COLORURINE COLORLESS (A) 05/10/2021 0808   APPEARANCEUR CLEAR 05/10/2021 0808   LABSPEC 1.010 05/10/2021 0808   PHURINE 6.0 05/10/2021 0808   GLUCOSEU 500 (A) 05/10/2021 0808   HGBUR NEGATIVE 05/10/2021 0808   BILIRUBINUR NEGATIVE 05/10/2021 0808   KETONESUR NEGATIVE 05/10/2021 0808   PROTEINUR 100 (A) 05/10/2021 0808   NITRITE NEGATIVE 05/10/2021 0808   LEUKOCYTESUR NEGATIVE 05/10/2021 1610    FURTHER DISCHARGE INSTRUCTIONS:   Get Medicines reviewed and adjusted: Please take all your medications with you for your next visit with your Primary MD   Laboratory/radiological data: Please request your Primary MD to go over all hospital tests and procedure/radiological results at the follow up, please ask your Primary MD to get all Hospital records sent to  his/her office.   In some cases, they will be blood work, cultures and biopsy results pending at the time of your discharge. Please request that your primary care M.D. goes through all the records of your hospital data and follows up on these results.   Also Note the following: If you experience worsening of your admission symptoms, develop shortness of breath, life threatening emergency, suicidal or homicidal thoughts you must seek medical attention immediately by calling 911 or calling your MD immediately  if symptoms less severe.   You must read complete instructions/literature along with all the possible adverse reactions/side effects for all the Medicines you take and that have been prescribed to you. Take any new Medicines after you have completely understood and accpet all the possible adverse reactions/side effects.    Do not drive when taking Pain medications or sleeping medications (Benzodaizepines)   Do not take more than prescribed Pain, Sleep and Anxiety Medications. It is not advisable to combine anxiety,sleep and pain medications without talking with your primary care practitioner   Special Instructions: If you have smoked or chewed Tobacco  in the last 2 yrs please stop smoking, stop any regular Alcohol  and or any Recreational drug use.   Wear Seat belts while driving.   Please note: You were cared for by a hospitalist during your hospital stay. Once you are discharged, your primary care physician will handle any further medical issues. Please note that NO REFILLS for any discharge medications will be authorized once you are discharged, as it is imperative that you return to your primary care physician (or establish a relationship with a primary care physician if you do not have one) for your post hospital discharge needs so that they can reassess your need for medications and monitor your lab values.  Time coordinating discharge: 40 minutes  SIGNED:  Pamella Pert, MD,  PhD 05/14/2021, 12:03 PM

## 2021-05-14 NOTE — Progress Notes (Addendum)
Progress Note  Patient Name: Andres Richardson Date of Encounter: 05/14/2021  [facility] Cardiologist: Parke Poisson, MD   Subjective   Patient states he is doing well, still remains free of chest pain, no longer having SOB, denied dizziness, syncope. He states he talked to his family overnight, does not wish to get cardiac cath during this hospitalization, wishes this to be done outpatient.   Inpatient Medications    Scheduled Meds:  amoxicillin-clavulanate  1 tablet Oral Q12H   aspirin EC  81 mg Oral Daily   atorvastatin  40 mg Oral Daily   azithromycin  500 mg Oral Daily   chlorhexidine  15 mL Mouth Rinse BID   heparin injection (subcutaneous)  5,000 Units Subcutaneous Q8H   insulin aspart  0-5 Units Subcutaneous QHS   insulin aspart  0-9 Units Subcutaneous TID WC   insulin aspart protamine- aspart  15 Units Subcutaneous BID WC   mouth rinse  15 mL Mouth Rinse q12n4p   metoprolol succinate  12.5 mg Oral Daily   pantoprazole  40 mg Oral BID AC   Continuous Infusions:  sodium chloride Stopped (05/11/21 1721)   PRN Meds: sodium chloride, acetaminophen, ipratropium-albuterol   Vital Signs    Vitals:   05/13/21 0447 05/13/21 1538 05/13/21 2049 05/14/21 0430  BP: (!) 155/73 (!) 150/86 108/63 138/90  Pulse: 99 (!) 103 100 92  Resp:  20 19 18   Temp: 98.3 F (36.8 C) 98.3 F (36.8 C) 98.1 F (36.7 C) 98.2 F (36.8 C)  TempSrc:  Oral Oral Oral  SpO2: 99% 98% 95% 98%  Weight:      Height:        Intake/Output Summary (Last 24 hours) at 05/14/2021 05/16/2021 Last data filed at 05/14/2021 0000 Gross per 24 hour  Intake 2100 ml  Output --  Net 2100 ml   Last 3 Weights 05/10/2021 02/24/2021 02/23/2021  Weight (lbs) 218 lb 4.1 oz 224 lb 3.3 oz 224 lb 3.3 oz  Weight (kg) 99 kg 101.7 kg 101.7 kg      Telemetry    Sinus rhythm/tachycardia 90-110s, occasional PVCs  - Personally Reviewed  ECG    N/A this AM - Personally Reviewed  Physical Exam   GEN: No acute  distress.   Neck: No JVD Cardiac: RRR, soft murmur noted LUSB Respiratory: Clear to auscultation bilaterally. On room air. Speaks full sentence.  GI: Soft, non-tender MS: Mild chronic BLE edema Neuro:  Nonfocal  Psych: Normal affect   Labs    High Sensitivity Troponin:   Recent Labs  Lab 05/10/21 0808 05/10/21 1025 05/10/21 1635 05/11/21 0804  TROPONINIHS 103* 893* 1,326* 1,359*     Chemistry Recent Labs  Lab 05/10/21 05/12/21 05/10/21 0815 05/11/21 0250 05/13/21 0407 05/14/21 0343  NA 139   < > 137 142 140  K 3.7   < > 3.9 3.9 3.9  CL 105  --  108 114* 109  CO2 17*  --  24 21* 22  GLUCOSE 436*  --  210* 316* 365*  BUN 18  --  16 19 16   CREATININE 1.10  --  0.85 1.14 1.01  CALCIUM 8.5*  --  8.1* 8.6* 8.3*  PROT 7.4  --   --   --   --   ALBUMIN 3.6  --   --   --   --   AST 20  --   --   --   --   ALT 18  --   --   --   --  ALKPHOS 64  --   --   --   --   BILITOT 0.2*  --   --   --   --   GFRNONAA >60  --  >60 >60 >60  ANIONGAP 17*  --  5 7 9    < > = values in this interval not displayed.    Lipids  Recent Labs  Lab 05/13/21 0407  CHOL 111  TRIG 197*  HDL 28*  LDLCALC 44  CHOLHDL 4.0    Hematology Recent Labs  Lab 05/11/21 0250 05/13/21 0407 05/14/21 0343  WBC 9.2 4.6 4.7  RBC 3.51* 3.44* 3.40*  HGB 9.2* 8.8* 8.8*  HCT 30.0* 29.5* 28.9*  MCV 85.5 85.8 85.0  MCH 26.2 25.6* 25.9*  MCHC 30.7 29.8* 30.4  RDW 16.5* 16.5* 16.3*  PLT 250 239 232   Thyroid No results for input(s): TSH, FREET4 in the last 168 hours.  BNP Recent Labs  Lab 05/10/21 0808  BNP 301.8*    DDimer No results for input(s): DDIMER in the last 168 hours.   Radiology    VAS 05/12/21 UPPER EXTREMITY VENOUS DUPLEX  Result Date: 05/13/2021 UPPER VENOUS STUDY  Patient Name:  Andres Richardson  Date of Exam:   05/13/2021 Medical Rec #: 05/15/2021        Accession #:    [id number] Date of Birth: 1965/09/03        Patient Gender: M Patient Age:   53 years Exam Location:  [facility]  Procedure:      [facility] UPPER EXTREMITY VENOUS DUPLEX Referring Phys: [name] --------------------------------------------------------------------------------  Indications: Pain, and Swelling Risk Factors: Previous IV and blood draw in LUE. Comparison Study: No previous exams Performing Technologist: Jody Hill RVT, RDMS  Examination Guidelines: A complete evaluation includes B-mode imaging, spectral Doppler, color Doppler, and power Doppler as needed of all accessible portions of each vessel. Bilateral testing is considered an integral part of a complete examination. Limited examinations for reoccurring indications may be performed as noted.  Right Findings: +----------+------------+---------+-----------+----------+-------+ RIGHT     CompressiblePhasicitySpontaneousPropertiesSummary +----------+------------+---------+-----------+----------+-------+ Subclavian    Full       Yes       Yes                      +----------+------------+---------+-----------+----------+-------+  Left Findings: +----------+------------+---------+-----------+----------+-------+ LEFT      CompressiblePhasicitySpontaneousPropertiesSummary +----------+------------+---------+-----------+----------+-------+ IJV           Full       Yes       Yes                      +----------+------------+---------+-----------+----------+-------+ Subclavian    Full                                          +----------+------------+---------+-----------+----------+-------+ Axillary      Full       Yes       Yes                      +----------+------------+---------+-----------+----------+-------+ Brachial      Full       Yes       Yes                      +----------+------------+---------+-----------+----------+-------+ Radial  Full       Yes       Yes                      +----------+------------+---------+-----------+----------+-------+ Ulnar         Full                                           +----------+------------+---------+-----------+----------+-------+ Cephalic      Full                                          +----------+------------+---------+-----------+----------+-------+ Basilic       Full       Yes       Yes                      +----------+------------+---------+-----------+----------+-------+ SVT(other) in LUE mid and distal forearm.  Summary:  Right: No evidence of thrombosis in the subclavian.  Left: No evidence of deep vein thrombosis in the upper extremity. Acute SVT(other) in LUE mid and distal forearm.  *See table(s) above for measurements and observations.  Diagnosing physician: Heath Lark Electronically signed by Heath Lark on 05/13/2021 at 5:27:29 PM.    Final     Cardiac Studies   Echo from 05/11/21:    1. Left ventricular ejection fraction, by estimation, is 50 to 55%. The  left ventricle has low normal function. The left ventricle demonstrates  regional wall motion abnormalities (see scoring diagram/findings for  description). There is moderate left ventricular hypertrophy. Left ventricular diastolic parameters are  consistent with Grade I diastolic dysfunction (impaired relaxation). There  is moderate hypokinesis of the left ventricular, basal inferior wall and inferolateral wall.   2. Right ventricular systolic function is normal. The right ventricular  size is normal. There is normal pulmonary artery systolic pressure. The  estimated right ventricular systolic pressure is 31.4 mmHg.   3. The mitral valve is grossly normal. Trivial mitral valve  regurgitation.   4. The aortic valve was not well visualized. Aortic valve regurgitation  is trivial.   5. The inferior vena cava is dilated in size with >50% respiratory  variability, suggesting right atrial pressure of 8 mmHg.   Comparison(s): No prior Echocardiogram.   Patient Profile     Andres Richardson is a 55 y.o. 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  Cardiology was consulted on 05/13/2021 for evaluation of elevated troponin.   Assessment & Plan    Non-STEMI CAD by non- coronary CT - HS trop 103 >893 >1326 >1359, poa - BNP 301, poa - EKG at admission 9/25 showed new onset minimal ST depression of lateral leads in the setting of sinus tachycardia - Repeat EKG 9//28/22 showed improved STD, T wave flattening noted of I and aVL, non-specific changes  - Echo 05/11/21 showed Echocardiogram from 9/26 showed EF 50 to 55%, moderate LVH, grade 1 DD, moderate hypokinesis  of LV, basal inferior wall, inferolateral wall, normal PASP with RVSP 31.4, trivial MR.  - None coronary CTA chest at admission showed three-vessel coronary artery calcifications - LDL 44 and A1C 6.8% from 05/13/21 - Suspect patient has CAD, has significant risk factors including poorly controlled DM, HLD, obesity, former tobacco use, and strong family hx; will need ischemic evaluation, Dr Jacques Navy had recommend cardiac cath given high likelihood of positive stress as well as significant trop elevation, after discussing with family, patient decided that he does not wish inpatient cath during this hospitalization and wants workup to be done outpatient, he is not having acute symptoms and had remained stable over the past 4 days, discussed with Dr Jacques Navy today who is OK with patient getting discharged today and recommend arrange outpatient follow up within 1 week for further ischemic evaluation.  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 and also infrainguinal arterial occlusive disease on the right, followed by Dr in the past, opted conservative management with improved symptoms of claudication on smoking cessation   Insulin dependent DM with hyperglycemia - A1C 9.5% from 02/22/21, A1C 6.8% from 05/13/21, appears historically poorly controlled with recurrent DKA/HHS - consider testing autoantibodies for confirmation of type 1 versus 2 as patient is unclear of his diagnosis and records are inconsistent  - defer management to IM /PCP    Left arm mild edema/improved  - had IV access before, no DVT on doppler , consistent with superficial thrombophlebitis, not CHF related    Acute hypoxic respiratory failure, resolved  Septic shock resolved  Aspiration pneumonia  Normocytic anemia Elevated blood pressure Chronic venous insufficiency  - managed per IM     For questions or updates, please contact [facility] Please consult [email or web address] for contact info under        Signed, 04/25/21, NP  05/14/2021, 9:27 AM    Patient seen and examined with Cyndi Bender NP.  Agree as above, with the following exceptions and changes as noted below. Patient would like to have outpatient cardiac cath. Gen: NAD, CV: RRR, no murmurs, Lungs: clear, Abd: soft, Extrem: Warm, well perfused, no edema, Neuro/Psych: alert and oriented x 3, normal mood and affect. All available labs, radiology testing, previous records reviewed. Should return to ER for concerning symptoms. He is very hesitant to consider cath. I do not think there is a role for noninvasive testing at this juncture and patient and I discussed this in detail. Diagnostic coronary angiogram is most logical next  step.   05/16/2021, MD 05/14/21 1:32 PM

## 2021-05-15 ENCOUNTER — Inpatient Hospital Stay (HOSPITAL_BASED_OUTPATIENT_CLINIC_OR_DEPARTMENT_OTHER)
Admission: EM | Admit: 2021-05-15 | Discharge: 2021-05-22 | DRG: 286 | Disposition: A | Discharge status: Home / Self Care | Payer: Self-pay | Attending: Internal Medicine | Admitting: Internal Medicine

## 2021-05-15 ENCOUNTER — Other Ambulatory Visit: Payer: Self-pay

## 2021-05-15 ENCOUNTER — Encounter (HOSPITAL_BASED_OUTPATIENT_CLINIC_OR_DEPARTMENT_OTHER): Payer: Self-pay | Admitting: Emergency Medicine

## 2021-05-15 ENCOUNTER — Emergency Department (HOSPITAL_BASED_OUTPATIENT_CLINIC_OR_DEPARTMENT_OTHER): Payer: Self-pay

## 2021-05-15 DIAGNOSIS — E119 Type 2 diabetes mellitus without complications: Secondary | ICD-10-CM

## 2021-05-15 DIAGNOSIS — Z87891 Personal history of nicotine dependence: Secondary | ICD-10-CM

## 2021-05-15 DIAGNOSIS — D638 Anemia in other chronic diseases classified elsewhere: Secondary | ICD-10-CM | POA: Diagnosis present

## 2021-05-15 DIAGNOSIS — E1140 Type 2 diabetes mellitus with diabetic neuropathy, unspecified: Secondary | ICD-10-CM | POA: Diagnosis present

## 2021-05-15 DIAGNOSIS — I5033 Acute on chronic diastolic (congestive) heart failure: Secondary | ICD-10-CM | POA: Diagnosis present

## 2021-05-15 DIAGNOSIS — E785 Hyperlipidemia, unspecified: Secondary | ICD-10-CM | POA: Diagnosis present

## 2021-05-15 DIAGNOSIS — E782 Mixed hyperlipidemia: Secondary | ICD-10-CM | POA: Diagnosis present

## 2021-05-15 DIAGNOSIS — I2510 Atherosclerotic heart disease of native coronary artery without angina pectoris: Secondary | ICD-10-CM | POA: Diagnosis present

## 2021-05-15 DIAGNOSIS — J690 Pneumonitis due to inhalation of food and vomit: Secondary | ICD-10-CM | POA: Diagnosis present

## 2021-05-15 DIAGNOSIS — Z20822 Contact with and (suspected) exposure to covid-19: Secondary | ICD-10-CM | POA: Diagnosis present

## 2021-05-15 DIAGNOSIS — G4733 Obstructive sleep apnea (adult) (pediatric): Secondary | ICD-10-CM | POA: Diagnosis present

## 2021-05-15 DIAGNOSIS — Z91048 Other nonmedicinal substance allergy status: Secondary | ICD-10-CM

## 2021-05-15 DIAGNOSIS — Z6830 Body mass index (BMI) 30.0-30.9, adult: Secondary | ICD-10-CM

## 2021-05-15 DIAGNOSIS — Z7984 Long term (current) use of oral hypoglycemic drugs: Secondary | ICD-10-CM

## 2021-05-15 DIAGNOSIS — E669 Obesity, unspecified: Secondary | ICD-10-CM | POA: Diagnosis present

## 2021-05-15 DIAGNOSIS — I110 Hypertensive heart disease with heart failure: Principal | ICD-10-CM | POA: Diagnosis present

## 2021-05-15 DIAGNOSIS — R7989 Other specified abnormal findings of blood chemistry: Secondary | ICD-10-CM | POA: Diagnosis present

## 2021-05-15 DIAGNOSIS — E876 Hypokalemia: Secondary | ICD-10-CM | POA: Diagnosis present

## 2021-05-15 DIAGNOSIS — R778 Other specified abnormalities of plasma proteins: Secondary | ICD-10-CM

## 2021-05-15 DIAGNOSIS — J810 Acute pulmonary edema: Secondary | ICD-10-CM

## 2021-05-15 DIAGNOSIS — J9601 Acute respiratory failure with hypoxia: Secondary | ICD-10-CM

## 2021-05-15 DIAGNOSIS — J811 Chronic pulmonary edema: Secondary | ICD-10-CM | POA: Diagnosis present

## 2021-05-15 DIAGNOSIS — I252 Old myocardial infarction: Secondary | ICD-10-CM

## 2021-05-15 DIAGNOSIS — I25110 Atherosclerotic heart disease of native coronary artery with unstable angina pectoris: Secondary | ICD-10-CM

## 2021-05-15 DIAGNOSIS — Z933 Colostomy status: Secondary | ICD-10-CM

## 2021-05-15 DIAGNOSIS — E1165 Type 2 diabetes mellitus with hyperglycemia: Secondary | ICD-10-CM | POA: Diagnosis present

## 2021-05-15 DIAGNOSIS — I5031 Acute diastolic (congestive) heart failure: Secondary | ICD-10-CM | POA: Diagnosis present

## 2021-05-15 DIAGNOSIS — I5030 Unspecified diastolic (congestive) heart failure: Secondary | ICD-10-CM | POA: Diagnosis present

## 2021-05-15 DIAGNOSIS — Z794 Long term (current) use of insulin: Secondary | ICD-10-CM

## 2021-05-15 DIAGNOSIS — J9621 Acute and chronic respiratory failure with hypoxia: Secondary | ICD-10-CM | POA: Diagnosis present

## 2021-05-15 DIAGNOSIS — R0602 Shortness of breath: Secondary | ICD-10-CM

## 2021-05-15 DIAGNOSIS — Z79899 Other long term (current) drug therapy: Secondary | ICD-10-CM

## 2021-05-15 DIAGNOSIS — E1151 Type 2 diabetes mellitus with diabetic peripheral angiopathy without gangrene: Secondary | ICD-10-CM | POA: Diagnosis present

## 2021-05-15 LAB — CBC WITH DIFFERENTIAL/PLATELET
Abs Immature Granulocytes: 0.05 10*3/uL (ref 0.00–0.07)
Basophils Absolute: 0.1 10*3/uL (ref 0.0–0.1)
Basophils Relative: 1 %
Eosinophils Absolute: 0.9 10*3/uL — ABNORMAL HIGH (ref 0.0–0.5)
Eosinophils Relative: 8 %
HCT: 37.4 % — ABNORMAL LOW (ref 39.0–52.0)
Hemoglobin: 11.1 g/dL — ABNORMAL LOW (ref 13.0–17.0)
Immature Granulocytes: 0 %
Lymphocytes Relative: 35 %
Lymphs Abs: 4.2 10*3/uL — ABNORMAL HIGH (ref 0.7–4.0)
MCH: 26 pg (ref 26.0–34.0)
MCHC: 29.7 g/dL — ABNORMAL LOW (ref 30.0–36.0)
MCV: 87.6 fL (ref 80.0–100.0)
Monocytes Absolute: 1.2 10*3/uL — ABNORMAL HIGH (ref 0.1–1.0)
Monocytes Relative: 10 %
Neutro Abs: 5.6 10*3/uL (ref 1.7–7.7)
Neutrophils Relative %: 46 %
Platelets: 456 10*3/uL — ABNORMAL HIGH (ref 150–400)
RBC: 4.27 MIL/uL (ref 4.22–5.81)
RDW: 16.6 % — ABNORMAL HIGH (ref 11.5–15.5)
WBC: 12 10*3/uL — ABNORMAL HIGH (ref 4.0–10.5)
nRBC: 0 % (ref 0.0–0.2)

## 2021-05-15 LAB — GLUCOSE, CAPILLARY
Glucose-Capillary: 312 mg/dL — ABNORMAL HIGH (ref 70–99)
Glucose-Capillary: 371 mg/dL — ABNORMAL HIGH (ref 70–99)
Glucose-Capillary: 233 mg/dL — ABNORMAL HIGH (ref 70–99)

## 2021-05-15 LAB — I-STAT ARTERIAL BLOOD GAS, ED
Acid-base deficit: 1 mmol/L (ref 0.0–2.0)
Bicarbonate: 23.8 mmol/L (ref 20.0–28.0)
Calcium, Ion: 1.18 mmol/L (ref 1.15–1.40)
HCT: 26 % — ABNORMAL LOW (ref 39.0–52.0)
Hemoglobin: 8.8 g/dL — ABNORMAL LOW (ref 13.0–17.0)
O2 Saturation: 100 %
Patient temperature: 98.1
Potassium: 3.9 mmol/L (ref 3.5–5.1)
Sodium: 143 mmol/L (ref 135–145)
TCO2: 25 mmol/L (ref 22–32)
pCO2 arterial: 38.9 mmHg (ref 32.0–48.0)
pH, Arterial: 7.392 (ref 7.350–7.450)
pO2, Arterial: 208 mmHg — ABNORMAL HIGH (ref 83.0–108.0)

## 2021-05-15 LAB — TROPONIN I (HIGH SENSITIVITY)
Troponin I (High Sensitivity): 176 ng/L (ref <18)
Troponin I (High Sensitivity): 223 ng/L (ref <18)

## 2021-05-15 LAB — COMPREHENSIVE METABOLIC PANEL
ALT: 32 U/L (ref 0–44)
AST: 36 U/L (ref 15–41)
Albumin: 3.7 g/dL (ref 3.5–5.0)
Alkaline Phosphatase: 72 U/L (ref 38–126)
Anion gap: 12 (ref 5–15)
BUN: 16 mg/dL (ref 6–20)
CO2: 20 mmol/L — ABNORMAL LOW (ref 22–32)
Calcium: 9 mg/dL (ref 8.9–10.3)
Chloride: 110 mmol/L (ref 98–111)
Creatinine, Ser: 1.16 mg/dL (ref 0.61–1.24)
GFR, Estimated: >60 mL/min (ref >60)
Glucose, Bld: 237 mg/dL — ABNORMAL HIGH (ref 70–99)
Potassium: 4.8 mmol/L (ref 3.5–5.1)
Sodium: 142 mmol/L (ref 135–145)
Total Bilirubin: 0.2 mg/dL — ABNORMAL LOW (ref 0.3–1.2)
Total Protein: 7.7 g/dL (ref 6.5–8.1)

## 2021-05-15 LAB — RESP PANEL BY RT-PCR (FLU A&B, COVID) ARPGX2
Influenza A by PCR: NEGATIVE
Influenza B by PCR: NEGATIVE
SARS Coronavirus 2 by RT PCR: NEGATIVE

## 2021-05-15 LAB — HEPARIN LEVEL (UNFRACTIONATED)
Heparin Unfractionated: <0.1 IU/mL — ABNORMAL LOW (ref 0.30–0.70)
Heparin Unfractionated: <0.1 IU/mL — ABNORMAL LOW (ref 0.30–0.70)

## 2021-05-15 LAB — CULTURE, BLOOD (ROUTINE X 2)
Culture: NO GROWTH
Culture: NO GROWTH
Special Requests: ADEQUATE
Special Requests: ADEQUATE

## 2021-05-15 LAB — BRAIN NATRIURETIC PEPTIDE: B Natriuretic Peptide: 405.1 pg/mL — ABNORMAL HIGH (ref 0.0–100.0)

## 2021-05-15 MED ORDER — FUROSEMIDE 10 MG/ML IJ SOLN
80.0000 mg | Freq: Once | INTRAMUSCULAR | Status: AC
  Administered 2021-05-15: 80 mg via INTRAVENOUS
  Filled 2021-05-15: qty 8

## 2021-05-15 MED ORDER — SODIUM CHLORIDE 0.9% FLUSH: 3.0000 mL | INTRAVENOUS | Status: DC | PRN

## 2021-05-15 MED ORDER — ONDANSETRON HCL 4 MG/2ML IJ SOLN: 4.0000 mg | Freq: Four times a day (QID) | INTRAMUSCULAR | Status: DC | PRN

## 2021-05-15 MED ORDER — METOPROLOL TARTRATE 5 MG/5ML IV SOLN: 5.0000 mg | Freq: Four times a day (QID) | INTRAVENOUS | Status: DC | PRN

## 2021-05-15 MED ORDER — INSULIN ASPART 100 UNIT/ML IJ SOLN
0.0000 [IU] | Freq: Three times a day (TID) | INTRAMUSCULAR | Status: DC
  Administered 2021-05-15 – 2021-05-16 (×2): 11 [IU] via SUBCUTANEOUS
  Administered 2021-05-16: 2 [IU] via SUBCUTANEOUS
  Administered 2021-05-16: 11 [IU] via SUBCUTANEOUS
  Administered 2021-05-17: 8 [IU] via SUBCUTANEOUS

## 2021-05-15 MED ORDER — NITROGLYCERIN IN D5W 200-5 MCG/ML-% IV SOLN
0.0000 ug/min | INTRAVENOUS | Status: DC
Start: 2021-05-15 — End: 2021-05-15
  Administered 2021-05-15: 5 ug/min via INTRAVENOUS
  Filled 2021-05-15: qty 250

## 2021-05-15 MED ORDER — ACETAMINOPHEN 325 MG PO TABS
650.0000 mg | ORAL_TABLET | Freq: Four times a day (QID) | ORAL | Status: DC | PRN
Start: 2021-05-15 — End: 2021-05-18

## 2021-05-15 MED ORDER — ACETAMINOPHEN 650 MG RE SUPP
650.0000 mg | Freq: Four times a day (QID) | RECTAL | Status: DC | PRN
Start: 2021-05-15 — End: 2021-05-18

## 2021-05-15 MED ORDER — SODIUM CHLORIDE 0.9% FLUSH
3.0000 mL | Freq: Two times a day (BID) | INTRAVENOUS | Status: DC
Start: 2021-05-15 — End: 2021-05-18
  Administered 2021-05-15 – 2021-05-17 (×4): 3 mL via INTRAVENOUS

## 2021-05-15 MED ORDER — AMOXICILLIN-POT CLAVULANATE 875-125 MG PO TABS
1.0000 | ORAL_TABLET | Freq: Two times a day (BID) | ORAL | Status: AC
  Administered 2021-05-15 – 2021-05-16 (×3): 1 via ORAL
  Filled 2021-05-15 (×3): qty 1

## 2021-05-15 MED ORDER — ASPIRIN EC 81 MG PO TBEC
81.0000 mg | DELAYED_RELEASE_TABLET | Freq: Every day | ORAL | Status: DC
  Administered 2021-05-15 – 2021-05-17 (×3): 81 mg via ORAL
  Filled 2021-05-15 (×4): qty 1

## 2021-05-15 MED ORDER — METOPROLOL SUCCINATE ER 25 MG PO TB24
12.5000 mg | ORAL_TABLET | Freq: Every day | ORAL | Status: DC
  Administered 2021-05-15 – 2021-05-21 (×7): 12.5 mg via ORAL
  Filled 2021-05-15 (×8): qty 1

## 2021-05-15 MED ORDER — ONDANSETRON HCL 4 MG PO TABS: 4.0000 mg | ORAL_TABLET | Freq: Four times a day (QID) | ORAL | Status: DC | PRN

## 2021-05-15 MED ORDER — ATORVASTATIN CALCIUM 40 MG PO TABS
40.0000 mg | ORAL_TABLET | Freq: Every day | ORAL | Status: DC
  Administered 2021-05-15 – 2021-05-22 (×8): 40 mg via ORAL
  Filled 2021-05-15 (×8): qty 1

## 2021-05-15 MED ORDER — HEPARIN (PORCINE) 25000 UT/250ML-% IV SOLN
2400.0000 [IU]/h | INTRAVENOUS | Status: DC
  Administered 2021-05-15: 1350 [IU]/h via INTRAVENOUS
  Administered 2021-05-15: 1600 [IU]/h via INTRAVENOUS
  Administered 2021-05-16: 2150 [IU]/h via INTRAVENOUS
  Administered 2021-05-16: 2200 [IU]/h via INTRAVENOUS
  Administered 2021-05-17: 2350 [IU]/h via INTRAVENOUS
  Administered 2021-05-17: 2400 [IU]/h via INTRAVENOUS
  Filled 2021-05-15 (×7): qty 250

## 2021-05-15 MED ORDER — SODIUM CHLORIDE 0.9 % IV SOLN: 250.0000 mL | INTRAVENOUS | Status: DC | PRN

## 2021-05-15 MED ORDER — INSULIN ASPART PROT & ASPART (70-30 MIX) 100 UNIT/ML ~~LOC~~ SUSP
15.0000 [IU] | Freq: Two times a day (BID) | SUBCUTANEOUS | Status: DC
  Administered 2021-05-15 – 2021-05-16 (×3): 15 [IU] via SUBCUTANEOUS
  Filled 2021-05-15 (×2): qty 10

## 2021-05-15 MED ORDER — INSULIN ASPART 100 UNIT/ML IJ SOLN: 0.0000 [IU] | Freq: Three times a day (TID) | INTRAMUSCULAR | Status: DC

## 2021-05-15 MED ORDER — HEPARIN BOLUS VIA INFUSION
4000.0000 [IU] | Freq: Once | INTRAVENOUS | Status: AC
  Administered 2021-05-15: 4000 [IU] via INTRAVENOUS

## 2021-05-15 NOTE — Progress Notes (Signed)
Patient here with [facility]. Patient removed from BiPAP and placed on 2 Lpm nasal cannula at this time. BiPAP brought in the room for patient to have on standby. Patient with no distress on [place], SATs 99%. RN at bedside. Will continue to monitor.

## 2021-05-15 NOTE — Consult Note (Signed)
Cardiology Consultation:   Patient ID: Andres Richardson MRN: [id number]; DOB: 20-Aug-1965  Admit date: 05/15/2021 Date of Consult: 05/15/2021  PCP:  [name], MD   [facility] Providers Cardiologist:  Parke Poisson, MD        Patient Profile:   Andres Richardson is a 55 y.o. male with a hx of insulin-dependent diabetes, formal tobacco abuse (quit in July 2022), hyperlipidemia, obesity, recent hospitalization in July 2022 for DKA and Fournier's gangrene s/p surgical debridement and loop sigmoid colostomy, who is being seen 05/15/2021 for the evaluation of shortness of breath in the setting of recent NSTEMI at the request of Dr. Artis Flock.  History of Present Illness:   Andres Richardson was just discharged from the hospital yesterday. He was seen by Dr. Jacques Navy during his hospitalization, who recommended cath for NSTEMI. He declined at that time.  He went home last evening and went to bed. Around 3:45 AM, he awoke to use the bathroom. He was in the bathroom when he developed chest pressure and severe shortness of breath. He was in severe discomfort, and his brother brought him to the ER. On arrival to the ER he was immediately placed on bipap.   He has made significant urine (around 4 L but not recorded) since arriving in the hospital. His breathing is now much improved, and he is currently on room air.  He is not currently having chest pain. He is nervous about cath as his aunt died while having a cath done. We discussed cath at length, see below.   Past Medical History:  Diagnosis Date   Diabetes mellitus without complication (HCC)    Type !! 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 Diabetic neuropathy.   Hyperlipidemia    Mixed   Hypertension    Essential   Microalbuminuria    Obesity    Peripheral arterial disease (HCC)    With Claudication   Tobacco use disorder     Past Surgical History:  Procedure Laterality Date   INCISION AND DRAINAGE ABSCESS N/A 02/24/2021    Procedure: INCISION AND DRAINAGE PERINEUM;  Surgeon: Sheliah Hatch De Blanch, MD;  Location: [facility];  Service: General;  Laterality: N/A;   INCISION AND DRAINAGE ABSCESS N/A 02/25/2021   Procedure: REPEAT WASHOUT OF PERINEUM;  Surgeon: [name], MD;  Location: [facility];  Service: General;  Laterality: N/A;   LAPAROSCOPIC DIVERTED COLOSTOMY N/A 02/25/2021   Procedure: LAPAROSCOPIC DIVERTING SIGMOID COLOSTOMY;  Surgeon: [name], MD;  Location: [facility];  Service: General;  Laterality: N/A;     Home Medications:  Prior to Admission medications   Medication Sig Start Date End Date Taking? Authorizing Provider  amoxicillin-clavulanate (AUGMENTIN) 875-125 MG tablet Take 1 tablet by mouth every 12 (twelve) hours for 2 days. 05/14/21 05/16/21  Leatha Gilding, MD  aspirin EC 81 MG EC tablet Take 1 tablet (81 mg total) by mouth daily. Swallow whole. 05/15/21   Leatha Gilding, MD  atorvastatin (LIPITOR) 40 MG tablet Take 1 tablet (40 mg total) by mouth daily. 05/15/21   Leatha Gilding, MD  glipiZIDE (GLUCOTROL XL) 5 MG 24 hr tablet Take 1 tablet (5 mg total) by mouth daily. 03/10/21   Almon Hercules, MD  insulin NPH-regular Human (NOVOLIN 70/30) (70-30) 100 UNIT/ML injection Inject 15 Units into the skin 2 (two) times daily with a meal. 03/10/21   Almon Hercules, MD  Insulin Syringe-Needle U-100 (INSULIN SYRINGE .3CC/31GX5/16") 31G X 5/16" 0.3 ML MISC Levemir 15 units  twice a day Patient not taking: No sig reported 05/14/19   Glade Lloyd, MD  metFORMIN (GLUCOPHAGE-XR) 750 MG 24 hr tablet Take 1 tablet (750 mg total) by mouth 2 (two) times daily. 03/10/21   Almon Hercules, MD  metoprolol succinate (TOPROL-XL) 25 MG 24 hr tablet Take 0.5 tablets (12.5 mg total) by mouth daily. 05/14/21   Leatha Gilding, MD  Multiple Vitamin (MULTIVITAMIN WITH MINERALS) TABS tablet Take 1 tablet by mouth daily. Patient not taking: Reported on 05/10/2021 03/10/21   Almon Hercules, MD  pantoprazole (PROTONIX)  40 MG tablet Take 1 tablet (40 mg total) by mouth daily. Patient not taking: Reported on 02/22/2021 05/14/19 06/13/19  Glade Lloyd, MD    Inpatient Medications: Scheduled Meds:  amoxicillin-clavulanate  1 tablet Oral Q12H   aspirin EC  81 mg Oral Daily   atorvastatin  40 mg Oral Daily   insulin aspart  0-15 Units Subcutaneous TID WC   insulin aspart protamine- aspart  15 Units Subcutaneous BID WC   metoprolol succinate  12.5 mg Oral Daily   sodium chloride flush  3 mL Intravenous Q12H   Continuous Infusions:  sodium chloride     heparin 1,600 Units/hr (05/15/21 1600)   PRN Meds: sodium chloride, acetaminophen **OR** acetaminophen, metoprolol tartrate, ondansetron **OR** ondansetron (ZOFRAN) IV, sodium chloride flush  Allergies:    Allergies  Allergen Reactions   Adhesive [Tape] Rash    Social History:   Social History   Socioeconomic History   Marital status: Single    Spouse name: Not on file   Number of children: Not on file   Years of education: Not on file   Highest education level: Not on file  Occupational History   Not on file  Tobacco Use   Smoking status: Former    Packs/day: 1.00    Types: Cigarettes    Quit date: 02/17/2021    Years since quitting: 0.2   Smokeless tobacco: Never   Tobacco comments:    Uses Nicorette Gum.   Vaping Use   Vaping Use: Never used  Substance and Sexual Activity   Alcohol use: Never   Drug use: Never   Sexual activity: Not on file  Other Topics Concern   Not on file  Social History Narrative   Not on file   Social Determinants of Health   Financial Resource Strain: Not on file  Food Insecurity: Not on file  Transportation Needs: Not on file  Physical Activity: Not on file  Stress: Not on file  Social Connections: Not on file  Intimate Partner Violence: Not on file    Family History:   History reviewed. No pertinent family history.   ROS:  Please see the history of present illness.  Constitutional: Negative  for chills, fever, night sweats, unintentional weight loss  HENT: Negative for ear pain and hearing loss.   Eyes: Negative for loss of vision and eye pain.  Respiratory: Positive for shortness of breath Cardiovascular: See HPI. Gastrointestinal: Negative for abdominal pain, melena, and hematochezia.  Genitourinary: Negative for dysuria and hematuria.  Musculoskeletal: Negative for falls and myalgias.  Skin: Negative for itching and rash.  Neurological: Negative for focal weakness, focal sensory changes and loss of consciousness.  Endo/Heme/Allergies: Does not bruise/bleed easily.   All other ROS reviewed and negative.     Physical Exam/Data:   Vitals:   05/15/21 1300 05/15/21 1400 05/15/21 1500 05/15/21 1627  BP: (!) 141/73 (!) 142/89  118/70  Pulse: 95 Marland Kitchen)  102 96 100  Resp:  20  18  Temp:  98.2 F (36.8 C)  98 F (36.7 C)  TempSrc:  Oral  Oral  SpO2: 97% 99% 97% 97%  Weight:      Height:        Intake/Output Summary (Last 24 hours) at 05/15/2021 1727 Last data filed at 05/15/2021 1600 Gross per 24 hour  Intake 1121.06 ml  Output 3325 ml  Net -2203.94 ml   Last 3 Weights 05/15/2021 05/15/2021 05/10/2021  Weight (lbs) 223 lb 12.3 oz 218 lb 4.1 oz 218 lb 4.1 oz  Weight (kg) 101.5 kg 99 kg 99 kg     Body mass index is 31.21 kg/m.  General:  Well nourished, well developed, in no acute distress HEENT: normal Neck: no JVD appreciated Vascular: No carotid bruits; Distal pulses 2+ bilaterally Cardiac:  normal S1, S2; RRR; no murmur  Lungs:  clear to auscultation bilaterally except for mild rales at the bases Abd: soft, nontender, no hepatomegaly  Ext: trivial bilateral upper and lower extremity edema Musculoskeletal:  No deformities Skin: warm and dry  Neuro:  CNs 2-12 intact, no focal abnormalities noted Psych:  Normal affect   EKG:  The EKG was personally reviewed and demonstrates:  sinus tachycardia with PACs Telemetry:  Telemetry was personally reviewed and  demonstrates:  NSR  Relevant CV Studies: Echo from 05/11/21:    1. Left ventricular ejection fraction, by estimation, is 50 to 55%. The  left ventricle has low normal function. The left ventricle demonstrates  regional wall motion abnormalities (see scoring diagram/findings for  description). There is moderate left ventricular hypertrophy. Left ventricular diastolic parameters are  consistent with Grade I diastolic dysfunction (impaired relaxation). There  is moderate hypokinesis of the left ventricular, basal inferior wall and inferolateral wall.   2. Right ventricular systolic function is normal. The right ventricular  size is normal. There is normal pulmonary artery systolic pressure. The  estimated right ventricular systolic pressure is 31.4 mmHg.   3. The mitral valve is grossly normal. Trivial mitral valve  regurgitation.   4. The aortic valve was not well visualized. Aortic valve regurgitation  is trivial.   5. The inferior vena cava is dilated in size with >50% respiratory  variability, suggesting right atrial pressure of 8 mmHg.   Comparison(s): No prior Echocardiogram.   Laboratory Data:  High Sensitivity Troponin:   Recent Labs  Lab 05/10/21 1025 05/10/21 1635 05/11/21 0804 05/15/21 0430 05/15/21 0600  TROPONINIHS 893* 1,326* 1,359* 176* 223*     Chemistry Recent Labs  Lab 05/13/21 0407 05/14/21 0343 05/15/21 0430 05/15/21 0541  NA 142 140 142 143  K 3.9 3.9 4.8 3.9  CL 114* 109 110  --   CO2 21* 22 20*  --   GLUCOSE 316* 365* 237*  --   BUN 19 16 16   --   CREATININE 1.14 1.01 1.16  --   CALCIUM 8.6* 8.3* 9.0  --   GFRNONAA >60 >60 >60  --   ANIONGAP 7 9 12   --     Recent Labs  Lab 05/10/21 0808 05/15/21 0430  PROT 7.4 7.7  ALBUMIN 3.6 3.7  AST 20 36  ALT 18 32  ALKPHOS 64 72  BILITOT 0.2* 0.2*   Lipids  Recent Labs  Lab 05/13/21 0407  CHOL 111  TRIG 197*  HDL 28*  LDLCALC 44  CHOLHDL 4.0    Hematology Recent Labs  Lab  05/13/21 0407 05/14/21 0343 05/15/21 0430  05/15/21 0541  WBC 4.6 4.7 12.0*  --   RBC 3.44* 3.40* 4.27  --   HGB 8.8* 8.8* 11.1* 8.8*  HCT 29.5* 28.9* 37.4* 26.0*  MCV 85.8 85.0 87.6  --   MCH 25.6* 25.9* 26.0  --   MCHC 29.8* 30.4 29.7*  --   RDW 16.5* 16.3* 16.6*  --   PLT 239 232 456*  --    Thyroid No results for input(s): TSH, FREET4 in the last 168 hours.  BNP Recent Labs  Lab 05/10/21 0808 05/15/21 0430  BNP 301.8* 405.1*    DDimer No results for input(s): DDIMER in the last 168 hours.   Radiology/Studies:  DG Chest Portable 1 View  Result Date: 05/15/2021 CLINICAL DATA:  Shortness of breath EXAM: PORTABLE CHEST 1 VIEW COMPARISON:  05/11/2021, 05/10/2021 FINDINGS: Increased interstitial prominence with patchy opacities bilaterally. No significant pleural effusion. Cardiomediastinal contours are within normal limits. IMPRESSION: Increased interstitial prominence with patchy opacities bilaterally. May reflect edema or recurrent pneumonia. Appearance is similar to initial 05/10/2021 radiograph. Electronically Signed   By: Guadlupe Spanish M.D.   On: 05/15/2021 05:22   VAS [name] UPPER EXTREMITY VENOUS DUPLEX  Result Date: 05/13/2021 UPPER VENOUS STUDY  Patient Name:  Andres Richardson  Date of Exam:   05/13/2021 Medical Rec #: [id number]        Accession #:    [id number] Date of Birth: 12-14-1965        Patient Gender: M Patient Age:   55 years Exam Location:  [facility] Procedure:      VAS [name] UPPER EXTREMITY VENOUS DUPLEX Referring Phys: Pamella Pert --------------------------------------------------------------------------------  Indications: Pain, and Swelling Risk Factors: Previous IV and blood draw in LUE. Comparison Study: No previous exams Performing Technologist: Jody Hill RVT, RDMS  Examination Guidelines: A complete evaluation includes B-mode imaging, spectral Doppler, color Doppler, and power Doppler as needed of all accessible portions of each vessel. Bilateral  testing is considered an integral part of a complete examination. Limited examinations for reoccurring indications may be performed as noted.  Right Findings: +----------+------------+---------+-----------+----------+-------+ RIGHT     CompressiblePhasicitySpontaneousPropertiesSummary +----------+------------+---------+-----------+----------+-------+ Subclavian    Full       Yes       Yes                      +----------+------------+---------+-----------+----------+-------+  Left Findings: +----------+------------+---------+-----------+----------+-------+ LEFT      CompressiblePhasicitySpontaneousPropertiesSummary +----------+------------+---------+-----------+----------+-------+ IJV           Full       Yes       Yes                      +----------+------------+---------+-----------+----------+-------+ Subclavian    Full                                          +----------+------------+---------+-----------+----------+-------+ Axillary      Full       Yes       Yes                      +----------+------------+---------+-----------+----------+-------+ Brachial      Full       Yes       Yes                      +----------+------------+---------+-----------+----------+-------+ Radial  Full       Yes       Yes                      +----------+------------+---------+-----------+----------+-------+ Ulnar         Full                                          +----------+------------+---------+-----------+----------+-------+ Cephalic      Full                                          +----------+------------+---------+-----------+----------+-------+ Basilic       Full       Yes       Yes                      +----------+------------+---------+-----------+----------+-------+ SVT(other) in LUE mid and distal forearm.  Summary:  Right: No evidence of thrombosis in the subclavian.  Left: No evidence of deep vein thrombosis in the upper  extremity. Acute SVT(other) in LUE mid and distal forearm.  *See table(s) above for measurements and observations.  Diagnosing physician: Heath Lark Electronically signed by Heath Lark on 05/13/2021 at 5:27:29 PM.    Final      Assessment and Plan:   Acute hypoxemic respiratory failure -immediately placed on bipap on arrival to ER, now weaned to room air -reports great relief with diuresis -nurse and RT noted apnea when sleeping, plan to trial bipap tonight. Will need formal sleep study as an outpatient -discharged 05/14/21 for aspiration pneumonia vs chemical pneumonitis  Elevated troponin Recent NSTEMI Coronary calcium -seen during recent hospitalization, recommended for cath by Dr. Jacques Navy but declined inpatient cath -continue aspirin 81 mg, atorvastatin 40 mg daily, metoprolol succinate 12.5 mg daily -hsTnI on presentation this AM was 176 > 223; prior admission peaked at 1326. -with recurrent chest pain this AM, restart heparin drip -would consider SLGT2i at discharge, ideally to replace glipizide -recent echo with EF 50-55%, focal WMA in the basal inferior and inferolateral walls  He is nervous about cath. We discussed at length, and I would continue to make sure his questions are addressed over the weekend. After discussion, he is amenable to cath on 10/3, placed on board  Risks and benefits of cardiac catheterization have been discussed with the patient.  These include bleeding, infection, kidney damage, stroke, heart attack, death.  The patient understands these risks and is willing to proceed.   CV risk factors:  -former tobacco use -insulin dependent diabetes, unclear type 1 vs type 2  PAD, with known aortoiliac disease -would recommend radial access if possible.   Risk Assessment/Risk Scores:     HEAR Score (for undifferentiated chest pain):  HEAR Score: 4   For questions or updates, please contact [facility] Please consult [email or web address] for contact info  under    Signed, Jodelle Red, MD  05/15/2021 5:27 PM

## 2021-05-15 NOTE — ED Provider Notes (Signed)
[facility] EMERGENCY DEPT Provider Note   CSN: [id number] Arrival date & time: 05/15/21  0413     History Chief Complaint  Patient presents with   Shortness of Breath    Andres Richardson is a 55 y.o. male.  Patient presents for shortness of breath.  It appears he was just recently in the hospital for what was thought to be community-acquired pneumonia.  He is on antibiotics and other medications he was discharged yesterday.  Patient states he felt well initially.  However after he went to sleep and around 0300 he woke up severely dyspneic.  He presented here for further evaluation.  Nothing else is changed since his hospitalization   Shortness of Breath     Past Medical History:  Diagnosis Date   Diabetes mellitus without complication (HCC)    Type !! 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 Diabetic neuropathy.   Hyperlipidemia    Mixed   Hypertension    Essential   Microalbuminuria    Obesity    Peripheral arterial disease (HCC)    With Claudication   Tobacco use disorder     Patient Active Problem List   Diagnosis Date Noted   Acute on chronic respiratory failure with hypoxia and hypercapnia (HCC)    Sepsis (HCC) 05/10/2021   Acute respiratory failure with hypoxia and hypercapnia (HCC) 05/10/2021   Multifocal pneumonia    Fournier's gangrene 02/23/2021   DKA (diabetic ketoacidosis) (HCC) 02/22/2021   DKA (diabetic ketoacidoses) 05/11/2019    Past Surgical History:  Procedure Laterality Date   INCISION AND DRAINAGE ABSCESS N/A 02/24/2021   Procedure: INCISION AND DRAINAGE PERINEUM;  Surgeon: [name], MD;  Location: [facility];  Service: General;  Laterality: N/A;   INCISION AND DRAINAGE ABSCESS N/A 02/25/2021   Procedure: REPEAT WASHOUT OF PERINEUM;  Surgeon: [name], MD;  Location: [facility];  Service: General;  Laterality: N/A;   LAPAROSCOPIC DIVERTED COLOSTOMY N/A 02/25/2021   Procedure: LAPAROSCOPIC DIVERTING SIGMOID  COLOSTOMY;  Surgeon: [name], MD;  Location: [facility];  Service: General;  Laterality: N/A;       History reviewed. No pertinent family history.  Social History   Tobacco Use   Smoking status: Former    Packs/day: 1.00    Types: Cigarettes    Quit date: 02/17/2021    Years since quitting: 0.2   Smokeless tobacco: Never   Tobacco comments:    Uses Nicorette Gum.   Vaping Use   Vaping Use: Never used  Substance Use Topics   Alcohol use: Never   Drug use: Never    Home Medications Prior to Admission medications   Medication Sig Start Date End Date Taking? Authorizing Provider  amoxicillin-clavulanate (AUGMENTIN) 875-125 MG tablet Take 1 tablet by mouth every 12 (twelve) hours for 2 days. 05/14/21 05/16/21  Leatha Gilding, MD  aspirin EC 81 MG EC tablet Take 1 tablet (81 mg total) by mouth daily. Swallow whole. 05/15/21   Leatha Gilding, MD  atorvastatin (LIPITOR) 40 MG tablet Take 1 tablet (40 mg total) by mouth daily. 05/15/21   Leatha Gilding, MD  glipiZIDE (GLUCOTROL XL) 5 MG 24 hr tablet Take 1 tablet (5 mg total) by mouth daily. 03/10/21   Almon Hercules, MD  insulin NPH-regular Human (NOVOLIN 70/30) (70-30) 100 UNIT/ML injection Inject 15 Units into the skin 2 (two) times daily with a meal. 03/10/21   Almon Hercules, MD  Insulin Syringe-Needle U-100 (INSULIN SYRINGE .3CC/31GX5/16") 31G X  5/16" 0.3 ML MISC Levemir 15 units twice a day Patient not taking: No sig reported 05/14/19   Glade Lloyd, MD  metFORMIN (GLUCOPHAGE-XR) 750 MG 24 hr tablet Take 1 tablet (750 mg total) by mouth 2 (two) times daily. 03/10/21   Almon Hercules, MD  metoprolol succinate (TOPROL-XL) 25 MG 24 hr tablet Take 0.5 tablets (12.5 mg total) by mouth daily. 05/14/21   Leatha Gilding, MD  Multiple Vitamin (MULTIVITAMIN WITH MINERALS) TABS tablet Take 1 tablet by mouth daily. Patient not taking: Reported on 05/10/2021 03/10/21   Almon Hercules, MD  pantoprazole (PROTONIX) 40 MG tablet Take 1  tablet (40 mg total) by mouth daily. Patient not taking: Reported on 02/22/2021 05/14/19 06/13/19  Glade Lloyd, MD    Allergies    Adhesive [tape]  Review of Systems   Review of Systems  Unable to perform ROS: Acuity of condition  Respiratory:  Positive for shortness of breath.    Physical Exam Updated Vital Signs BP 109/64 (BP Location: Left Arm)   Pulse (!) 113   Temp 98.1 F (36.7 C) (Oral)   Resp 18   Ht 5\' 11"  (1.803 m)   Wt 99 kg   SpO2 100%   BMI 30.44 kg/m   Physical Exam Vitals and nursing note reviewed.  Constitutional:      General: He is in acute distress.     Appearance: He is well-developed. He is ill-appearing and diaphoretic.  HENT:     Head: Normocephalic and atraumatic.  Cardiovascular:     Rate and Rhythm: Normal rate.  Pulmonary:     Effort: Tachypnea and respiratory distress present.     Breath sounds: Rhonchi and rales present.  Chest:     Chest wall: No deformity or tenderness.  Abdominal:     General: There is no distension.  Musculoskeletal:        General: Normal range of motion.     Cervical back: Normal range of motion.  Skin:    General: Skin is warm.  Neurological:     General: No focal deficit present.     Mental Status: He is alert and oriented to person, place, and time.    ED Results / Procedures / Treatments   Labs (all labs ordered are listed, but only abnormal results are displayed) Labs Reviewed  CBC WITH DIFFERENTIAL/PLATELET - Abnormal; Notable for the following components:      Result Value   WBC 12.0 (*)    Hemoglobin 11.1 (*)    HCT 37.4 (*)    MCHC 29.7 (*)    RDW 16.6 (*)    Platelets 456 (*)    Lymphs Abs 4.2 (*)    Monocytes Absolute 1.2 (*)    Eosinophils Absolute 0.9 (*)    All other components within normal limits  COMPREHENSIVE METABOLIC PANEL - Abnormal; Notable for the following components:   CO2 20 (*)    Glucose, Bld 237 (*)    Total Bilirubin 0.2 (*)    All other components within normal  limits  BRAIN NATRIURETIC PEPTIDE - Abnormal; Notable for the following components:   B Natriuretic Peptide 405.1 (*)    All other components within normal limits  TROPONIN I (HIGH SENSITIVITY) - Abnormal; Notable for the following components:   Troponin I (High Sensitivity) 176 (*)    All other components within normal limits  SARS CORONAVIRUS 2 (TAT 6-24 HRS)  BLOOD GAS, ARTERIAL    EKG None  Radiology DG Chest  Portable 1 View  Result Date: 05/15/2021 CLINICAL DATA:  Shortness of breath EXAM: PORTABLE CHEST 1 VIEW COMPARISON:  05/11/2021, 05/10/2021 FINDINGS: Increased interstitial prominence with patchy opacities bilaterally. No significant pleural effusion. Cardiomediastinal contours are within normal limits. IMPRESSION: Increased interstitial prominence with patchy opacities bilaterally. May reflect edema or recurrent pneumonia. Appearance is similar to initial 05/10/2021 radiograph. Electronically Signed   By: Guadlupe Spanish M.D.   On: 05/15/2021 05:22   VAS [name] UPPER EXTREMITY VENOUS DUPLEX  Result Date: 05/13/2021 UPPER VENOUS STUDY  Patient Name:  Andres Richardson  Date of Exam:   05/13/2021 Medical Rec #: [id number]        Accession #:    [id number] Date of Birth: [birth year]        Patient Gender: M Patient Age:   59 years Exam Location:  [place] [place] Procedure:      VAS [name] UPPER EXTREMITY VENOUS DUPLEX Referring Phys: Pamella Pert --------------------------------------------------------------------------------  Indications: Pain, and Swelling Risk Factors: Previous IV and blood draw in LUE. Comparison Study: No previous exams Performing Technologist: Jody Hill RVT, RDMS  Examination Guidelines: A complete evaluation includes B-mode imaging, spectral Doppler, color Doppler, and power Doppler as needed of all accessible portions of each vessel. Bilateral testing is considered an integral part of a complete examination. Limited examinations for reoccurring indications may be  performed as noted.  Right Findings: +----------+------------+---------+-----------+----------+-------+ RIGHT     CompressiblePhasicitySpontaneousPropertiesSummary +----------+------------+---------+-----------+----------+-------+ Subclavian    Full       Yes       Yes                      +----------+------------+---------+-----------+----------+-------+  Left Findings: +----------+------------+---------+-----------+----------+-------+ LEFT      CompressiblePhasicitySpontaneousPropertiesSummary +----------+------------+---------+-----------+----------+-------+ IJV           Full       Yes       Yes                      +----------+------------+---------+-----------+----------+-------+ Subclavian    Full                                          +----------+------------+---------+-----------+----------+-------+ Axillary      Full       Yes       Yes                      +----------+------------+---------+-----------+----------+-------+ Brachial      Full       Yes       Yes                      +----------+------------+---------+-----------+----------+-------+ Radial        Full       Yes       Yes                      +----------+------------+---------+-----------+----------+-------+ Ulnar         Full                                          +----------+------------+---------+-----------+----------+-------+ Cephalic      Full                                          +----------+------------+---------+-----------+----------+-------+  Basilic       Full       Yes       Yes                      +----------+------------+---------+-----------+----------+-------+ SVT(other) in LUE mid and distal forearm.  Summary:  Right: No evidence of thrombosis in the subclavian.  Left: No evidence of deep vein thrombosis in the upper extremity. Acute SVT(other) in LUE mid and distal forearm.  *See table(s) above for measurements and observations.  Diagnosing  physician: Heath Lark Electronically signed by Heath Lark on 05/13/2021 at 5:27:29 PM.    Final     Procedures .Critical Care Performed by: Marily Memos, MD Authorized by: Marily Memos, MD   Critical care provider statement:    Critical care time (minutes):  45   Critical care was necessary to treat or prevent imminent or life-threatening deterioration of the following conditions:  Circulatory failure and respiratory failure   Critical care was time spent personally by me on the following activities:  Discussions with consultants, evaluation of patient's response to treatment, examination of patient, ordering and performing treatments and interventions, ordering and review of laboratory studies, ordering and review of radiographic studies, pulse oximetry, re-evaluation of patient's condition, obtaining history from patient or surrogate and review of old charts   Medications Ordered in ED Medications  nitroGLYCERIN 50 mg in dextrose 5 % 250 mL (0.2 mg/mL) infusion (25 mcg/min Intravenous Infusion Verify 05/15/21 0518)  furosemide (LASIX) injection 80 mg (80 mg Intravenous Given 05/15/21 0450)    ED Course  I have reviewed the triage vital signs and the nursing notes.  Pertinent labs & imaging results that were available during my care of the patient were reviewed by me and considered in my medical decision making (see chart for details).    MDM Rules/Calculators/A&P                         Patient arrives in acute hypoxic respiratory failure and acute respiratory distress.  Patient was immediately put on BiPAP.  His blood pressure was significantly elevated he was started on nitroglycerin drip and given Lasix as well.  His chest x-ray confirmed likely pulmonary edema.  His BNP is elevated higher than previously.  His previous ultrasound did show grade 1 diastolic dysfunction and low normal systolic function with multiple areas of hypokinesis.  Soundly they wanted to do a catheterization  however he wanted to do as an outpatient.  Patient was having some chest pain earlier.  His troponin is elevated but is probably just residual from his previous hospitalization when it peaked in the 1400 range.  Patient had significant improvement with above interventions and respiratory work improved significantly.  I suspect patient will likely go to [facility] so he can have a cardiology consult to readress cath needs.  Discussed with Dr. Rachael Darby, will admit.   Final Clinical Impression(s) / ED Diagnoses Final diagnoses:  Acute respiratory failure with hypoxia (HCC)  Acute pulmonary edema (HCC)    Rx / DC Orders ED Discharge Orders     None        Shriyans Kuenzi, Barbara Cower, MD 05/15/21 0630

## 2021-05-15 NOTE — ED Notes (Signed)
RT placed pt on BIPAP/NIV servo U on settings of 15/5 BUR 16 100%. Pt respiratory status at this time labored/tachypneic/with accessory muscle use prior to placement on BIPAP/NIV. Pt respiratory status improved slightly w/some distress noted at this time. RT will continue to monitor.

## 2021-05-15 NOTE — TOC Progression Note (Addendum)
Transition of Care [facility]) - Progression Note    Patient Details  Name: Andres Richardson MRN: [id number] Date of Birth: 1966/07/08  [facility] of [facility] CM/SW Contact  Leone Haven, RN Phone Number: 05/15/2021, 1:44 PM  Clinical Narrative:    NCM spoke with patient he has PCP, [name] and he would like to continue seeing her, he states he will have money to get his medications at discharge. Looks like he was just dc from [facility]. TOC will still follow in case may needs ast with [facility].        Expected Discharge Plan and Services                                                 Social Determinants of Health (SDOH) Interventions    Readmission Risk Interventions No flowsheet data found.

## 2021-05-15 NOTE — ED Triage Notes (Signed)
   Patient comes in with SOB that awoke him from sleeping earlier.  Patient states he was admitted and stayed in the hospital for 3 days but was never told what for.  Patient states he felt fine when he went to bed but woke up around 0345 struggling to breathe.  Patient unable to sit still and diaphoretic.  Pain 10/10, pressure in chest.

## 2021-05-15 NOTE — ED Notes (Signed)
Report called to inpatient RN.

## 2021-05-15 NOTE — ED Notes (Signed)
RT Note: Pt. tolerating current settings well at this time, no c/o, mask adjusted, awaiting transport via [facility] to [facility], RT to monitor.

## 2021-05-15 NOTE — H&P (Signed)
History and Physical    Andres Richardson WNI:[id number] DOB: [birth year] DOA: 05/15/2021  PCP: [name], MD Consultants:  cardiology: Dr. Jacques Navy  Patient coming from: [facility]. Lives with his adopted brother at home.   Chief Complaint: shortness of breath   HPI: Andres Richardson is a 55 y.o. male with medical history significant of T2DM with recent Fournier's gangrene in 2022, HLD, recent NSTEMI and possible pneumonia on 05/10/21 with hospitalization, CAD by non-coronary CT, arterial occlusive disease who presented to ED with acute shortness of breath.  He was just discharged from hospital yesterday. He was feeling well last night when he went to bed.  He woke up around 4AM to go to the bathroom. He felt gurgling in his lungs and by the time he finished urinating he couldn't breath. He felt like he was drowning and was gasping for air. He had chest pressure during this episode, like someone sitting on his chest. No radiation down the left arm or up the jaw. He was diaphoretic. He called his roommate who drove him to the ED.   He denies any fever/chills since being discharged. He has had swelling in his legs and feet over the last day. He has no cough. He had no orthopnea.   Recently stopped smoking in 02/2021. 40 year hx of smoking 2 PPD.   ED Course: vitals: Afebrile, blood pressure 155/107, heart rate 154, respiratory rate 36, oxygen 90% on room air.  Placed on BiPAP with oxygen coming up to 100%. Pertinent labs: WBC 12.0, hemoglobin 11.1, platelets 456, CO2 20, glucose 237, troponin 176--->223, arterial blood gas showed a pH of 7.392.  BNP of 405 Chest X-ray : Increased interstitial prominence with patchy opacities bilaterally.  May reflect edema or recurrent pneumonia appearance is similar to initial 9/25-2022 chest x-ray.  In ED patient was given 80 mg of Lasix x1 started on a nitroglycerin drip as well as a heparin drip.  We were called and asked to admit  Review of Systems: As per HPI;  otherwise review of systems reviewed and negative.   Ambulatory Status:  Ambulates without assistance   Past Medical History:  Diagnosis Date   Diabetes mellitus without complication (HCC)    Type !! 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 Diabetic neuropathy.   Hyperlipidemia    Mixed   Hypertension    Essential   Microalbuminuria    Obesity    Peripheral arterial disease (HCC)    With Claudication   Tobacco use disorder     Past Surgical History:  Procedure Laterality Date   INCISION AND DRAINAGE ABSCESS N/A 02/24/2021   Procedure: INCISION AND DRAINAGE PERINEUM;  Surgeon: Sheliah Hatch De Blanch, MD;  Location: [facility];  Service: General;  Laterality: N/A;   INCISION AND DRAINAGE ABSCESS N/A 02/25/2021   Procedure: REPEAT WASHOUT OF PERINEUM;  Surgeon: [name], MD;  Location: [facility];  Service: General;  Laterality: N/A;   LAPAROSCOPIC DIVERTED COLOSTOMY N/A 02/25/2021   Procedure: LAPAROSCOPIC DIVERTING SIGMOID COLOSTOMY;  Surgeon: [name], MD;  Location: [facility];  Service: General;  Laterality: N/A;    Social History   Socioeconomic History   Marital status: Single    Spouse name: Not on file   Number of children: Not on file   Years of education: Not on file   Highest education level: Not on file  Occupational History   Not on file  Tobacco Use   Smoking status: Former    Packs/day: 1.00  Types: Cigarettes    Quit date: 02/17/2021    Years since quitting: 0.2   Smokeless tobacco: Never   Tobacco comments:    Uses Nicorette Gum.   Vaping Use   Vaping Use: Never used  Substance and Sexual Activity   Alcohol use: Never   Drug use: Never   Sexual activity: Not on file  Other Topics Concern   Not on file  Social History Narrative   Not on file   Social Determinants of Health   Financial Resource Strain: Not on file  Food Insecurity: Not on file  Transportation Needs: Not on file  Physical Activity: Not on file  Stress: Not  on file  Social Connections: Not on file  Intimate Partner Violence: Not on file    Allergies  Allergen Reactions   Adhesive [Tape] Rash    History reviewed. No pertinent family history.  Prior to Admission medications   Medication Sig Start Date End Date Taking? Authorizing Provider  amoxicillin-clavulanate (AUGMENTIN) 875-125 MG tablet Take 1 tablet by mouth every 12 (twelve) hours for 2 days. 05/14/21 05/16/21  Leatha Gilding, MD  aspirin EC 81 MG EC tablet Take 1 tablet (81 mg total) by mouth daily. Swallow whole. 05/15/21   Leatha Gilding, MD  atorvastatin (LIPITOR) 40 MG tablet Take 1 tablet (40 mg total) by mouth daily. 05/15/21   Leatha Gilding, MD  glipiZIDE (GLUCOTROL XL) 5 MG 24 hr tablet Take 1 tablet (5 mg total) by mouth daily. 03/10/21   Almon Hercules, MD  insulin NPH-regular Human (NOVOLIN 70/30) (70-30) 100 UNIT/ML injection Inject 15 Units into the skin 2 (two) times daily with a meal. 03/10/21   Almon Hercules, MD  Insulin Syringe-Needle U-100 (INSULIN SYRINGE .3CC/31GX5/16") 31G X 5/16" 0.3 ML MISC Levemir 15 units twice a day Patient not taking: No sig reported 05/14/19   Glade Lloyd, MD  metFORMIN (GLUCOPHAGE-XR) 750 MG 24 hr tablet Take 1 tablet (750 mg total) by mouth 2 (two) times daily. 03/10/21   Almon Hercules, MD  metoprolol succinate (TOPROL-XL) 25 MG 24 hr tablet Take 0.5 tablets (12.5 mg total) by mouth daily. 05/14/21   Leatha Gilding, MD  Multiple Vitamin (MULTIVITAMIN WITH MINERALS) TABS tablet Take 1 tablet by mouth daily. Patient not taking: Reported on 05/10/2021 03/10/21   Almon Hercules, MD  pantoprazole (PROTONIX) 40 MG tablet Take 1 tablet (40 mg total) by mouth daily. Patient not taking: Reported on 02/22/2021 05/14/19 06/13/19  Glade Lloyd, MD    Physical Exam: Vitals:   05/15/21 1247 05/15/21 1250 05/15/21 1300 05/15/21 1400  BP: (!) 141/78 (!) 141/78 (!) 141/73 (!) 142/89  Pulse: (!) 103 (!) 104 95 (!) 102  Resp: 20 (!) 24    Temp:       TempSrc:      SpO2: 96% 98% 97% 99%  Weight: 101.5 kg     Height: 5\' 11"  (1.803 m)        General:  Appears calm and comfortable and is in NAD Eyes:  PERRL, EOMI, normal lids, iris ENT:  grossly normal hearing, lips & tongue, mmm; appropriate dentition. Upper dentures Neck:  no LAD, masses or thyromegaly; no carotid bruits Cardiovascular:  RRR, no m/r/g. Bilateral LE edema. 10-2+ to mid calf. L>R Respiratory:   bibasilar crackles. Good air movement. No wheezing.  Normal respiratory effort. Abdomen:  soft, NT, ND, NABS. Left ostomy bag.  Back:   normal alignment, no CVAT Skin:  no  rash or induration seen on limited exam Msculoskeletal:  grossly normal tone BUE/BLE, good ROM, no bony abnormality Lower extremity:    Limited foot exam with no ulcerations.  2+ distal pulses. Psychiatric:  grossly normal mood and affect, speech fluent and appropriate, AOx3 Neurologic:  CN 2-12 grossly intact, moves all extremities in coordinated fashion, sensation intact    Radiological Exams on Admission: Independently reviewed - see discussion in A/P where applicable  DG Chest Portable 1 View  Result Date: 05/15/2021 CLINICAL DATA:  Shortness of breath EXAM: PORTABLE CHEST 1 VIEW COMPARISON:  05/11/2021, 05/10/2021 FINDINGS: Increased interstitial prominence with patchy opacities bilaterally. No significant pleural effusion. Cardiomediastinal contours are within normal limits. IMPRESSION: Increased interstitial prominence with patchy opacities bilaterally. May reflect edema or recurrent pneumonia. Appearance is similar to initial 05/10/2021 radiograph. Electronically Signed   By: Guadlupe Spanish M.D.   On: 05/15/2021 05:22    EKG: Independently reviewed.  Sinus tachy with rate 146; nonspecific ST changes with no evidence of acute ischemia. ? MAT   Labs on Admission: I have personally reviewed the available labs and imaging studies at the time of the admission.  Pertinent labs:  WBC 12.0,   hemoglobin 11.1,  platelets 456,  CO2 20,  glucose 237,  troponin 176--->223,  arterial blood gas showed a pH of 7.392.   BNP of 405  Assessment/Plan Principal Problem:   Acute respiratory failure with hypoxia (HCC) -55 year old presenting with acute respiratory failure requiring immediate BiPAP on arrival to ER now weaned down to 1 L oxygen via nasal cannula -Patient's symptoms seem to have resolved after significant diuresis -? Flash pulmonary edema in setting of CAD vs. CHF -bipap at night as noted to have apnea when sleeping   Active Problems:   Elevated troponin/recent NSTEMI -telemetry  -troponin 176>223. Prior admission peaked at 1326.  Originally recommended a heart cath at this hospitalization but patient declined and wanted to do outpatient. -chest pain this AM, on heparin drip. Chest pain has resolved.  -continue ASA, lipitor 40mg /day and his metoprolol -cardiology consulted  -heart cath planned for 10/3.     CAD by non coronary CT  -continue ASA/statin -heart cath 05/18/21.   Acute diastolic CHF (congestive heart failure) (HCC) -Recent echo in September 2022 showed an EF of 50 to 55% the left ventricular had low normal function and demonstrated regional wall motion abnormalities.  Grade 1 diastolic dysfunction.  Moderate hypokinesis of the left ventricular, basal inferior wall and inferior lateral wall. -Chest x-ray consistent with edema, BNP elevated, and patient clinically looks volume overloaded -Significant diuresis with 80 mg IV of Lasix and near resolution of his respiratory failure (per cardiology 4L although not documented)  -Continue to monitor intake and output and daily weights -Diuresis per cardiology or day team after monitoring output.    Type 2 diabetes mellitus without complication, with long-term current use of insulin (HCC) -Recent A1c of 6.8, well controlled -Was diagnosed at age 51 and has been off insulin for years until recently. doubtful type  I diabetic, will check anti GAD65 antibodies to rule out type 1 diabetes.  -continue home 70/30 15 units BID with SSI in place and accuchecks per protocol.     Hyperlipidemia continue atrovastatin 40mg /daily    Recent hospitalization for aspiration pneumonia vs. Chemical pneumonitis,  -originally placed on broad spectrum abx and deescalated to augmentin.  Continue augmentin x2 days   Recent Fournier's gangrene in July 2022 -s/p surgical debridement and ostomy. Supposed to get ostomy reversal  in November.  -wound care ordered for ostomy care.   Body mass index is 31.21 kg/m.   Level of care: Progressive DVT prophylaxis:  heparin gtt  Code Status:  Full - confirmed with patient/family Family Communication: roommate present: [name] Disposition Plan:  The patient is from: home  Anticipated d/c is to: home   Requires inpatient hospitalization and is at significant risk of worsening, requires constant monitoring, assessment and MDM with specialists.  Patient is currently: stable  Consults called: cardiology   Admission status:  inpatient   Dragon dictation used in completing this note.    Orland Mustard MD [facility]   How to contact the [facility] Attending or Consulting provider 7A - 7P or covering provider during after hours 7P -7A, for this patient?  Check the care team in [facility] and look for a) attending/consulting [facility] provider listed and b) the [facility] team listed Log into [email or web address] and use [facility] universal password to access. If you do not have the password, please contact the hospital operator. Locate the [facility] provider you are looking for under [facility] and page to a number that you can be directly reached. If you still have difficulty reaching the provider, please page the [facility] (Director on Call) for the Hospitalists listed on amion for assistance.   05/15/2021, 3:05 PM

## 2021-05-15 NOTE — ED Notes (Signed)
Pts respiratory status stable w/no distress noted at this time. Pt resting more comfortably on BIPAP/NIV. RT will continue to monitor.

## 2021-05-15 NOTE — Progress Notes (Signed)
Upon checking on patient, Patient was sleeping on RA, SATs were slowly dropping. Patient was belly breathing and then had a period of apnea, SATs went down to 88% before I awoke patient. Patient returned to normal breathing with no shortness of breath while awake. RN made aware. RN notifying MD.

## 2021-05-15 NOTE — Progress Notes (Signed)
ANTICOAGULATION CONSULT NOTE - Follow-Up Consult  Pharmacy Consult for Heparin Indication: chest pain/ACS  Allergies  Allergen Reactions   Adhesive [Tape] Rash    Patient Measurements: Height: 5\' 11"  (180.3 cm) Weight: 101.5 kg (223 lb 12.3 oz) IBW/kg (Calculated) : 75.3 Heparin Dosing Weight: 96 kg  Vital Signs: Temp: 98.2 F (36.8 C) (09/30 1956) Temp Source: Oral (09/30 1956) BP: 130/59 (09/30 1956) Pulse Rate: 98 (09/30 1956)  Labs: Recent Labs    05/13/21 0407 05/14/21 0343 05/15/21 0430 05/15/21 0541 05/15/21 0600 05/15/21 1358 05/15/21 2131  HGB 8.8* 8.8* 11.1* 8.8*  --   --   --   HCT 29.5* 28.9* 37.4* 26.0*  --   --   --   PLT 239 232 456*  --   --   --   --   HEPARINUNFRC  --   --   --   --   --  <0.10* <0.10*  CREATININE 1.14 1.01 1.16  --   --   --   --   TROPONINIHS  --   --  176*  --  223*  --   --      Estimated Creatinine Clearance: 87.3 mL/min (by C-G formula based on SCr of 1.16 mg/dL).   Medical History: Past Medical History:  Diagnosis Date   Diabetes mellitus without complication (HCC)    Type !! 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 Diabetic neuropathy.   Hyperlipidemia    Mixed   Hypertension    Essential   Microalbuminuria    Obesity    Peripheral arterial disease (HCC)    With Claudication   Tobacco use disorder     Medications:  Awaiting home med rec  Assessment: 55 y.o. M presents with SOB. Trop up to 223. Just d/c on 9/29 from [facility] after being treated for PNA. Initial plan was for outpatient cardiac cath.  Now to start heparin for ACS and likely cath. No AC PTA. Hgb 8.8 (low but stable over past few days).   Heparin level undetectable on 1600 units/hr Spoke with nurse and line infusing well No signs/symptoms of bleed   Goal of Therapy:  Heparin level 0.3-0.7 units/ml Monitor platelets by anticoagulation protocol: Yes   Plan:  Increase Heparin to 1850 units/hr Repeat heparin level with am labs Daily  heparin level and CBC  Thank you for allowing pharmacy to be a part of this patient's care.  [facility], PharmD Clinical Pharmacist  Please check AMION for all [facility] Pharmacy numbers After 10:00 PM, call Main Pharmacy [phone]

## 2021-05-15 NOTE — Progress Notes (Signed)
ANTICOAGULATION CONSULT NOTE - Initial Consult  Pharmacy Consult for Heparin Indication: chest pain/ACS  Allergies  Allergen Reactions   Adhesive [Tape] Rash    Patient Measurements: Height: 5\' 11"  (180.3 cm) Weight: 99 kg (218 lb 4.1 oz) IBW/kg (Calculated) : 75.3 Heparin Dosing Weight: 96 kg  Vital Signs: Temp: 98.1 F (36.7 C) (09/30 0426) Temp Source: Oral (09/30 0426) BP: 115/69 (09/30 0615) Pulse Rate: 110 (09/30 0615)  Labs: Recent Labs    05/13/21 0407 05/14/21 0343 05/15/21 0430 05/15/21 0541 05/15/21 0600  HGB 8.8* 8.8* 11.1* 8.8*  --   HCT 29.5* 28.9* 37.4* 26.0*  --   PLT 239 232 456*  --   --   CREATININE 1.14 1.01 1.16  --   --   TROPONINIHS  --   --  176*  --  223*    Estimated Creatinine Clearance: 86.3 mL/min (by C-G formula based on SCr of 1.16 mg/dL).   Medical History: Past Medical History:  Diagnosis Date   Diabetes mellitus without complication (HCC)    Type !! 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 Diabetic neuropathy.   Hyperlipidemia    Mixed   Hypertension    Essential   Microalbuminuria    Obesity    Peripheral arterial disease (HCC)    With Claudication   Tobacco use disorder     Medications:  Awaiting home med rec  Assessment: 55 y.o. M presents with SOB. Trop up to 223. Just d/c on 9/29 from [facility] after being treated for PNA. To start heparin for ACS. No AC PTA. Hgb 8.8 (low but stable over past few days).    Goal of Therapy:  Heparin level 0.3-0.7 units/ml Monitor platelets by anticoagulation protocol: Yes   Plan:  Heparin IV bolus 4000 units Heparin gtt at 1350 units/hr Will f/u heparin level in 6 hours Daily heparin level and CBC  [facility], PharmD, BCPS Please see amion for complete clinical pharmacist phone list 05/15/2021,7:02 AM

## 2021-05-15 NOTE — Progress Notes (Signed)
ANTICOAGULATION CONSULT NOTE - Follow-Up Consult  Pharmacy Consult for Heparin Indication: chest pain/ACS  Allergies  Allergen Reactions   Adhesive [Tape] Rash    Patient Measurements: Height: 5\' 11"  (180.3 cm) Weight: 101.5 kg (223 lb 12.3 oz) IBW/kg (Calculated) : 75.3 Heparin Dosing Weight: 96 kg  Vital Signs: Temp: 98.1 F (36.7 C) (09/30 0426) Temp Source: Oral (09/30 0426) BP: 142/89 (09/30 1400) Pulse Rate: 102 (09/30 1400)  Labs: Recent Labs    05/13/21 0407 05/14/21 0343 05/15/21 0430 05/15/21 0541 05/15/21 0600 05/15/21 1358  HGB 8.8* 8.8* 11.1* 8.8*  --   --   HCT 29.5* 28.9* 37.4* 26.0*  --   --   PLT 239 232 456*  --   --   --   HEPARINUNFRC  --   --   --   --   --  <0.10*  CREATININE 1.14 1.01 1.16  --   --   --   TROPONINIHS  --   --  176*  --  223*  --      Estimated Creatinine Clearance: 87.3 mL/min (by C-G formula based on SCr of 1.16 mg/dL).   Medical History: Past Medical History:  Diagnosis Date   Diabetes mellitus without complication (HCC)    Type !! 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 Diabetic neuropathy.   Hyperlipidemia    Mixed   Hypertension    Essential   Microalbuminuria    Obesity    Peripheral arterial disease (HCC)    With Claudication   Tobacco use disorder     Medications:  Awaiting home med rec  Assessment: 55 y.o. M presents with SOB. Trop up to 223. Just d/c on 9/29 from [facility] after being treated for PNA. Initial plan was for outpatient cardiac cath.  Now to start heparin for ACS and likely cath. No AC PTA. Hgb 8.8 (low but stable over past few days).   Heparin level is subtherapeutic on 1350 units/hr.   Goal of Therapy:  Heparin level 0.3-0.7 units/ml Monitor platelets by anticoagulation protocol: Yes   Plan:  Increase Heparin to 1600 units/hr Repeat heparin level in 6 hours Daily heparin level and CBC  Matalyn Nawaz, Pharm.D., BCPS Clinical Pharmacist  **Pharmacist phone directory can be  found on [email or web address] listed under [facility].  05/15/2021 3:08 PM

## 2021-05-15 NOTE — ED Notes (Signed)
Nitro drip discontinued as verbally order by Dr Clayborne Dana.

## 2021-05-15 NOTE — Plan of Care (Signed)
  Problem: Education: Goal: Knowledge of General Education information will improve Description: Including pain rating scale, medication(s)/side effects and non-pharmacologic comfort measures Outcome: Progressing   Problem: Health Behavior/Discharge Planning: Goal: Ability to manage health-related needs will improve Outcome: Progressing   Problem: Clinical Measurements: Goal: Ability to maintain clinical measurements within normal limits will improve Outcome: Progressing Goal: Will remain free from infection Outcome: Progressing Goal: Respiratory complications will improve Outcome: Progressing Note: Transitioned to room air sats 99% Goal: Cardiovascular complication will be avoided Outcome: Progressing   Problem: Activity: Goal: Risk for activity intolerance will decrease Outcome: Progressing   Problem: Nutrition: Goal: Adequate nutrition will be maintained Outcome: Progressing   Problem: Elimination: Goal: Will not experience complications related to bowel motility Outcome: Progressing Goal: Will not experience complications related to urinary retention Outcome: Progressing   Problem: Pain Managment: Goal: General experience of comfort will improve Outcome: Progressing   Problem: Safety: Goal: Ability to remain free from injury will improve Outcome: Progressing   Problem: Skin Integrity: Goal: Risk for impaired skin integrity will decrease Outcome: Progressing

## 2021-05-15 NOTE — ED Notes (Signed)
Critical lab value troponin 176 verbalized to Dr. Clayborne Dana by A. Renaldo Harrison, Charity fundraiser

## 2021-05-15 NOTE — ED Notes (Signed)
RT obtained ABG on pt with the following results. Pts O2 reduced to 40% d/t PaO2 of 208. RT will continue to monitor.   Results for Andres Richardson, Andres Richardson (MRN [id number]) as of 05/15/2021 05:59  Ref. Range 05/15/2021 05:41  Sample type Unknown ARTERIAL  pH, Arterial Latest Ref Range: 7.350 - 7.450  7.392  pCO2 arterial Latest Ref Range: 32.0 - 48.0 mmHg 38.9  pO2, Arterial Latest Ref Range: 83.0 - 108.0 mmHg 208 (H)  TCO2 Latest Ref Range: 22 - 32 mmol/L 25  Acid-base deficit Latest Ref Range: 0.0 - 2.0 mmol/L 1.0  Bicarbonate Latest Ref Range: 20.0 - 28.0 mmol/L 23.8  O2 Saturation Latest Units: % 100.0  Patient temperature Unknown 98.1 F  Collection site Unknown Radial

## 2021-05-15 NOTE — Consult Note (Signed)
WOC Nurse ostomy consult note Patient receiving care in [facility] 3E03 Stoma type/location: LLQ loop colostomy Stomal assessment/size: Not assessed today. States he just changed his pouch yesterday. Peristomal assessment:  Treatment options for stomal/peristomal skin: Ring barriers Output  Ostomy pouching: 1pc. Flex convex with ostomy belt and ring barriers  Education provided: None Enrolled patient in [employer] DC program: Yes (previously) This is not a new ostomy and this patient is independent with pouch changes. WOC will not follow at this time.  1 piece fecal convex pouch   1 piece fecal convex pouch [place] # [id number] Barrier Rings [place] # [phone]  Andres Reel. Katrinka Blazing, MSN, RN, CMSRN, Angus Seller, [facility] Wound Treatment Associate Pager [phone]

## 2021-05-16 ENCOUNTER — Inpatient Hospital Stay (HOSPITAL_COMMUNITY): Payer: Self-pay

## 2021-05-16 DIAGNOSIS — D649 Anemia, unspecified: Secondary | ICD-10-CM

## 2021-05-16 DIAGNOSIS — Z933 Colostomy status: Secondary | ICD-10-CM

## 2021-05-16 DIAGNOSIS — Z87438 Personal history of other diseases of male genital organs: Secondary | ICD-10-CM

## 2021-05-16 DIAGNOSIS — I5031 Acute diastolic (congestive) heart failure: Secondary | ICD-10-CM

## 2021-05-16 LAB — HEPARIN LEVEL (UNFRACTIONATED)
Heparin Unfractionated: <0.1 IU/mL — ABNORMAL LOW (ref 0.30–0.70)
Heparin Unfractionated: 0.36 IU/mL (ref 0.30–0.70)
Heparin Unfractionated: 0.27 IU/mL — ABNORMAL LOW (ref 0.30–0.70)

## 2021-05-16 LAB — CBC
HCT: 31.8 % — ABNORMAL LOW (ref 39.0–52.0)
Hemoglobin: 9.6 g/dL — ABNORMAL LOW (ref 13.0–17.0)
MCH: 26 pg (ref 26.0–34.0)
MCHC: 30.2 g/dL (ref 30.0–36.0)
MCV: 86.2 fL (ref 80.0–100.0)
Platelets: 251 10*3/uL (ref 150–400)
RBC: 3.69 MIL/uL — ABNORMAL LOW (ref 4.22–5.81)
RDW: 16.7 % — ABNORMAL HIGH (ref 11.5–15.5)
WBC: 5.9 10*3/uL (ref 4.0–10.5)
nRBC: 0 % (ref 0.0–0.2)

## 2021-05-16 LAB — BASIC METABOLIC PANEL
Anion gap: 8 (ref 5–15)
BUN: 15 mg/dL (ref 6–20)
CO2: 24 mmol/L (ref 22–32)
Calcium: 8.3 mg/dL — ABNORMAL LOW (ref 8.9–10.3)
Chloride: 104 mmol/L (ref 98–111)
Creatinine, Ser: 1.15 mg/dL (ref 0.61–1.24)
GFR, Estimated: >60 mL/min (ref >60)
Glucose, Bld: 326 mg/dL — ABNORMAL HIGH (ref 70–99)
Potassium: 3.6 mmol/L (ref 3.5–5.1)
Sodium: 136 mmol/L (ref 135–145)

## 2021-05-16 LAB — GLUCOSE, CAPILLARY
Glucose-Capillary: 306 mg/dL — ABNORMAL HIGH (ref 70–99)
Glucose-Capillary: 148 mg/dL — ABNORMAL HIGH (ref 70–99)
Glucose-Capillary: 324 mg/dL — ABNORMAL HIGH (ref 70–99)
Glucose-Capillary: 219 mg/dL — ABNORMAL HIGH (ref 70–99)

## 2021-05-16 MED ORDER — HEPARIN BOLUS VIA INFUSION
3000.0000 [IU] | Freq: Once | INTRAVENOUS | Status: AC
  Administered 2021-05-16: 3000 [IU] via INTRAVENOUS
  Filled 2021-05-16: qty 3000

## 2021-05-16 NOTE — Progress Notes (Signed)
PROGRESS NOTE  Andres Richardson GQB:[id number] DOB: [birth year]   PCP: [name], MD  Patient is from: Home.  Independently ambulates at baseline.  DOA: 05/15/2021 LOS: 1  Chief complaints:  Chief Complaint  Patient presents with   Shortness of Breath     Brief Narrative / Interim history: 55 year old M with PMH of IDDM-2, former 80-pack-year history of smoking (quit in 02/2021), Fournier's gangrene s/p debridement and loop sigmoid colostomy in 02/2021, and most recent hospitalization from 9/25-9/29 for acute hypoxemic and hypercapnic RF and sepsis in the setting of aspiration pneumonia and chemical pneumonitis, non-STEMI and DKA returning with acute chest pain and dyspnea about 4 AM while avoiding that prompted him to call EMS.  Reportedly, patient was anxious about her cauterization previous hospitalization.   In ED, he was slightly hypertensive.  He was tachypneic to 36 with saturation of 90%.  BNP 405.  Trop 176>> 223.  He was placed on BiPAP, IV Lasix, nitro drip and heparin, and admitted for chest pain with elevated troponin/possible non-STEMI and CHF exacerbation.  Cardiology consulted and planning heart catheterization on 10/3.   Subjective: Seen and examined earlier this morning.  No major events overnight of this morning.  No complaint this morning.  He denies chest pain, shortness of breath, palpitation, GI or UTI symptoms.  He denies melena or hematochezia.  Denies changes to ostomy output.  Denies NSAID use.   Objective: Vitals:   05/16/21 0144 05/16/21 0544 05/16/21 0801 05/16/21 1207  BP: (!) 160/75 125/70 (!) 147/89 138/65  Pulse: 96 98 89 86  Resp: 18 18  16   Temp: 98.6 F (37 C) 98.4 F (36.9 C) 97.8 F (36.6 C) 97.6 F (36.4 C)  TempSrc: Oral Oral Oral Oral  SpO2: 96% 97% 99% 100%  Weight: 100.5 kg     Height:        Intake/Output Summary (Last 24 hours) at 05/16/2021 1252 Last data filed at 05/16/2021 1145 Gross per 24 hour  Intake 1747.59 ml  Output 4150 ml   Net -2402.41 ml   Filed Weights   05/15/21 0427 05/15/21 1247 05/16/21 0144  Weight: 99 kg 101.5 kg 100.5 kg    Examination:  GENERAL: No apparent distress.  Nontoxic. HEENT: MMM.  Vision and hearing grossly intact.  NECK: Supple.  No apparent JVD.  RESP: 100% on RA.  No IWOB.  Fair aeration bilaterally. CVS:  RRR. Heart sounds normal.  ABD/GI/GU: BS+. Abd soft, NTND.  Ostomy over LLQ with normal-looking stool. MSK/EXT:  Moves extremities. No apparent deformity.  Trace edema bilaterally. SKIN: no apparent skin lesion or wound NEURO: Awake, alert and oriented appropriately.  No apparent focal neuro deficit. PSYCH: Calm. Normal affect.   Procedures:  None  Microbiology summarized: COVID-19 and influenza PCR nonreactive.  Assessment & Plan: Acute respiratory failure with hypoxia: Likely due to CHF or undiagnosed COPD and OSA. Desaturated to 88% while asleep yesterday afternoon.  Carries 80-pack-year history of smoking.  Now 100% on room air. -Needs outpatient PFTs and sleep study -Manage CHF as below -CPAP/BiPAP at night  Elevated troponin/recent NSTEMI: Presents with acute chest pain and dyspnea.  Troponin 07/16/21 (peaked at 1326 prior to admission when he declined heart cath).  EKG with sinus tachycardia at 140s with what looks like PACs.  TTE on 9/26 with LVEF of 50 to 55%, R WMA, G1 DD, RVSP of 31.  Chest pain, dyspnea and tachycardia has resolved. -Plan for heart catheterization on 10/3 -Continue IV heparin, metoprolol, ASA  Acute diastolic CHF: TTE as above.  BNP 405 (slightly higher than prior admission).  CXR with bilateral patchy opacities as above which could be from fluid or resolving pneumonia.  Does not look fluid overloaded on exam but could be improved with diuretics.  He had about 4.5 L UOP after IV Lasix yesterday.  Cr stable.  Cardiopulmonary symptoms resolved.   -Repeat portable CXR to see change -Defer diuretics to cardiology -Monitor fluid status, renal  functions and electrolytes. -Sodium and fluid restriction  Controlled IDDM-2 with hyperglycemia and other vascular complication: A1c 6.8% on 9/28. Recent Labs  Lab 05/15/21 1322 05/15/21 1625 05/15/21 2053 05/16/21 0604 05/16/21 1119  GLUCAP 312* 371* 233* 306* 148*  -Continue home 70 3015 units twice daily -Continue SSI-moderate -Continue statin  Normocytic anemia: H&H seems to be at baseline.  Denies melena or hematochezia. Recent Labs    03/03/21 0451 03/08/21 0853 05/10/21 6948 05/10/21 0815 05/11/21 0250 05/13/21 0407 05/14/21 0343 05/15/21 0430 05/15/21 0541 05/16/21 0319  HGB 11.4* 9.2* 10.9* 12.6* 9.2* 8.8* 8.8* 11.1* 8.8* 9.6*  -Check anemia panel in the morning -Continue monitoring  Hyperlipidemia -Continue statin as above   Recent hospitalization for aspiration pneumonia vs. Chemical pneumonitis -Continue augmentin x2 days to complete treatment course   Recent Fournier's gangrene in July 2022--s/p surgical debridement and ostomy.  -Plan for ostomy reversal in November.  -Appreciate help by WOCN.  Class I obesity Body mass index is 30.91 kg/m.         DVT prophylaxis:    On IV heparin for non-STEMI.  Code Status: Full code Family Communication: Patient and/or RN. Available if any question.  Level of care: Progressive.  Change level of care to telemetry Status is: Inpatient  Remains inpatient appropriate because:Ongoing diagnostic testing needed not appropriate for outpatient work up, IV treatments appropriate due to intensity of illness or inability to take PO, and Inpatient level of care appropriate due to severity of illness  Dispo: The patient is from: Home              Anticipated d/c is to: Home              Patient currently is not medically stable to d/c.   Difficult to place patient No       Consultants:  Cardiology   Sch Meds:  Scheduled Meds:  amoxicillin-clavulanate  1 tablet Oral Q12H   aspirin EC  81 mg Oral Daily    atorvastatin  40 mg Oral Daily   insulin aspart  0-15 Units Subcutaneous TID WC   insulin aspart protamine- aspart  15 Units Subcutaneous BID WC   metoprolol succinate  12.5 mg Oral Daily   sodium chloride flush  3 mL Intravenous Q12H   Continuous Infusions:  sodium chloride     heparin 2,150 Units/hr (05/16/21 0800)   PRN Meds:.sodium chloride, acetaminophen **OR** acetaminophen, metoprolol tartrate, ondansetron **OR** ondansetron (ZOFRAN) IV, sodium chloride flush  Antimicrobials: Anti-infectives (From admission, onward)    Start     Dose/Rate Route Frequency Ordered Stop   05/15/21 2200  amoxicillin-clavulanate (AUGMENTIN) 875-125 MG per tablet 1 tablet        1 tablet Oral Every 12 hours 05/15/21 1517 05/17/21 2159        I have personally reviewed the following labs and images: CBC: Recent Labs  Lab 05/10/21 0808 05/10/21 0815 05/11/21 0250 05/13/21 0407 05/14/21 0343 05/15/21 0430 05/15/21 0541 05/16/21 0319  WBC 18.8*  --  9.2 4.6 4.7 12.0*  --  5.9  NEUTROABS 9.6*  --   --   --   --  5.6  --   --   HGB 10.9*   < > 9.2* 8.8* 8.8* 11.1* 8.8* 9.6*  HCT 37.3*   < > 30.0* 29.5* 28.9* 37.4* 26.0* 31.8*  MCV 88.4  --  85.5 85.8 85.0 87.6  --  86.2  PLT 568*  --  250 239 232 456*  --  251   < > = values in this interval not displayed.   BMP &GFR Recent Labs  Lab 05/11/21 0250 05/13/21 0407 05/14/21 0343 05/15/21 0430 05/15/21 0541 05/16/21 0319  NA 137 142 140 142 143 136  K 3.9 3.9 3.9 4.8 3.9 3.6  CL 108 114* 109 110  --  104  CO2 24 21* 22 20*  --  24  GLUCOSE 210* 316* 365* 237*  --  326*  BUN 16 19 16 16   --  15  CREATININE 0.85 1.14 1.01 1.16  --  1.15  CALCIUM 8.1* 8.6* 8.3* 9.0  --  8.3*   Estimated Creatinine Clearance: 87.7 mL/min (by C-G formula based on SCr of 1.15 mg/dL). Liver & Pancreas: Recent Labs  Lab 05/10/21 0808 05/15/21 0430  AST 20 36  ALT 18 32  ALKPHOS 64 72  BILITOT 0.2* 0.2*  PROT 7.4 7.7  ALBUMIN 3.6 3.7   No  results for input(s): LIPASE, AMYLASE in the last 168 hours. No results for input(s): AMMONIA in the last 168 hours. Diabetic: No results for input(s): HGBA1C in the last 72 hours. Recent Labs  Lab 05/15/21 1322 05/15/21 1625 05/15/21 2053 05/16/21 0604 05/16/21 1119  GLUCAP 312* 371* 233* 306* 148*   Cardiac Enzymes: No results for input(s): CKTOTAL, CKMB, CKMBINDEX, TROPONINI in the last 168 hours. No results for input(s): PROBNP in the last 8760 hours. Coagulation Profile: Recent Labs  Lab 05/10/21 0808  INR 1.1   Thyroid Function Tests: No results for input(s): TSH, T4TOTAL, FREET4, T3FREE, THYROIDAB in the last 72 hours. Lipid Profile: No results for input(s): CHOL, HDL, LDLCALC, TRIG, CHOLHDL, LDLDIRECT in the last 72 hours. Anemia Panel: No results for input(s): VITAMINB12, FOLATE, FERRITIN, TIBC, IRON, RETICCTPCT in the last 72 hours. Urine analysis:    Component Value Date/Time   COLORURINE COLORLESS (A) 05/10/2021 0808   APPEARANCEUR CLEAR 05/10/2021 0808   LABSPEC 1.010 05/10/2021 0808   PHURINE 6.0 05/10/2021 0808   GLUCOSEU 500 (A) 05/10/2021 0808   HGBUR NEGATIVE 05/10/2021 0808   BILIRUBINUR NEGATIVE 05/10/2021 0808   KETONESUR NEGATIVE 05/10/2021 0808   PROTEINUR 100 (A) 05/10/2021 0808   NITRITE NEGATIVE 05/10/2021 0808   LEUKOCYTESUR NEGATIVE 05/10/2021 0808   Sepsis Labs: Invalid input(s): PROCALCITONIN, LACTICIDVEN  Microbiology: Recent Results (from the past 240 hour(s))  Blood Culture (routine x 2)     Status: None   Collection Time: 05/10/21  8:00 AM   Specimen: BLOOD  Result Value Ref Range Status   Specimen Description   Final    BLOOD LEFT UPPER ARM Performed at [facility], [address] [place], [place]    Special Requests   Final    BOTTLES DRAWN AEROBIC AND ANAEROBIC Blood Culture adequate volume Performed at [facility], [address], [place], [place]    Culture    Final    NO GROWTH 5 DAYS Performed at [facility], [address], [place], [place]    Report Status 05/15/2021 FINAL  Final  Blood Culture (routine x 2)     Status: None   Collection Time: 05/10/21  8:07 AM   Specimen: BLOOD RIGHT HAND  Result Value Ref Range Status   Specimen Description   Final    BLOOD RIGHT HAND Performed at [facility], [address], [place], [place] [address]    Special Requests   Final    BOTTLES DRAWN AEROBIC AND ANAEROBIC Blood Culture adequate volume Performed at [facility], [address] [place], [place] [address]    Culture   Final    NO GROWTH 5 DAYS Performed at [facility], [address] [place], [place] [address]    Report Status 05/15/2021 FINAL  Final  Resp Panel by RT-PCR (Flu A&B, Covid) Nasopharyngeal Swab     Status: None   Collection Time: 05/10/21 10:25 AM   Specimen: Nasopharyngeal Swab; Nasopharyngeal(NP) swabs in vial transport medium  Result Value Ref Range Status   SARS Coronavirus 2 by RT PCR NEGATIVE NEGATIVE Final    Comment: (NOTE) SARS-CoV-2 target nucleic acids are NOT DETECTED.  The SARS-CoV-2 RNA is generally detectable in upper respiratory specimens during the acute phase of infection. The lowest concentration of SARS-CoV-2 viral copies this assay can detect is 138 copies/mL. A negative result does not preclude SARS-Cov-2 infection and should not be used as the sole basis for treatment or other patient management decisions. A negative result may occur with  improper specimen collection/handling, submission of specimen other than nasopharyngeal swab, presence of viral mutation(s) within the areas targeted by this assay, and inadequate number of viral copies(<138 copies/mL). A negative result must be combined with clinical observations, patient history, and epidemiological information. The expected result is Negative.  Fact Sheet for Patients:   BloggerCourse.com  Fact Sheet for Healthcare Providers:  SeriousBroker.it  This test is no t yet approved or cleared by the [place] FDA and  has been authorized for detection and/or diagnosis of SARS-CoV-2 by FDA under an Emergency Use Authorization (EUA). This EUA will remain  in effect (meaning this test can be used) for the duration of the COVID-19 declaration under Section 564(b)(1) of the Act, 21 U.S.C.section 360bbb-3(b)(1), unless the authorization is terminated  or revoked sooner.       Influenza A by PCR NEGATIVE NEGATIVE Final   Influenza B by PCR NEGATIVE NEGATIVE Final    Comment: (NOTE) The Xpert Xpress SARS-CoV-2/FLU/RSV plus assay is intended as an aid in the diagnosis of influenza from Nasopharyngeal swab specimens and should not be used as a sole basis for treatment. Nasal washings and aspirates are unacceptable for Xpert Xpress SARS-CoV-2/FLU/RSV testing.  Fact Sheet for Patients: BloggerCourse.com  Fact Sheet for Healthcare Providers: SeriousBroker.it  This test is not yet approved or cleared by the [place] FDA and has been authorized for detection and/or diagnosis of SARS-CoV-2 by FDA under an Emergency Use Authorization (EUA). This EUA will remain in effect (meaning this test can be used) for the duration of the COVID-19 declaration under Section 564(b)(1) of the Act, 21 U.S.C. section 360bbb-3(b)(1), unless the authorization is terminated or revoked.  Performed at [facility], [address] [place], [place] [address]   MRSA Next Gen by PCR, Nasal     Status: None   Collection Time: 05/10/21 11:51 AM   Specimen: Nasal Mucosa; Nasal Swab  Result Value Ref Range Status   MRSA by PCR Next Gen NOT DETECTED NOT DETECTED Final    Comment: (NOTE)  The GeneXpert MRSA Assay (FDA approved for NASAL specimens only), is one  component of a comprehensive MRSA colonization surveillance program. It is not intended to diagnose MRSA infection nor to guide or monitor treatment for MRSA infections. Test performance is not FDA approved in patients less than 55 years old. Performed at [facility], [address], [place], [place] [address]   Resp Panel by RT-PCR (Flu A&B, Covid) Nasopharyngeal Swab     Status: None   Collection Time: 05/15/21  5:45 AM   Specimen: Nasopharyngeal Swab; Nasopharyngeal(NP) swabs in vial transport medium  Result Value Ref Range Status   SARS Coronavirus 2 by RT PCR NEGATIVE NEGATIVE Final    Comment: (NOTE) SARS-CoV-2 target nucleic acids are NOT DETECTED.  The SARS-CoV-2 RNA is generally detectable in upper respiratory specimens during the acute phase of infection. The lowest concentration of SARS-CoV-2 viral copies this assay can detect is 138 copies/mL. A negative result does not preclude SARS-Cov-2 infection and should not be used as the sole basis for treatment or other patient management decisions. A negative result may occur with  improper specimen collection/handling, submission of specimen other than nasopharyngeal swab, presence of viral mutation(s) within the areas targeted by this assay, and inadequate number of viral copies(<138 copies/mL). A negative result must be combined with clinical observations, patient history, and epidemiological information. The expected result is Negative.  Fact Sheet for Patients:  BloggerCourse.com  Fact Sheet for Healthcare Providers:  SeriousBroker.it  This test is no t yet approved or cleared by the [place] FDA and  has been authorized for detection and/or diagnosis of SARS-CoV-2 by FDA under an Emergency Use Authorization (EUA). This EUA will remain  in effect (meaning this test can be used) for the duration of the COVID-19 declaration under Section 564(b)(1)  of the Act, 21 U.S.C.section 360bbb-3(b)(1), unless the authorization is terminated  or revoked sooner.       Influenza A by PCR NEGATIVE NEGATIVE Final   Influenza B by PCR NEGATIVE NEGATIVE Final    Comment: (NOTE) The Xpert Xpress SARS-CoV-2/FLU/RSV plus assay is intended as an aid in the diagnosis of influenza from Nasopharyngeal swab specimens and should not be used as a sole basis for treatment. Nasal washings and aspirates are unacceptable for Xpert Xpress SARS-CoV-2/FLU/RSV testing.  Fact Sheet for Patients: BloggerCourse.com  Fact Sheet for Healthcare Providers: SeriousBroker.it  This test is not yet approved or cleared by the [place] FDA and has been authorized for detection and/or diagnosis of SARS-CoV-2 by FDA under an Emergency Use Authorization (EUA). This EUA will remain in effect (meaning this test can be used) for the duration of the COVID-19 declaration under Section 564(b)(1) of the Act, 21 U.S.C. section 360bbb-3(b)(1), unless the authorization is terminated or revoked.  Performed at [facility], [address], [place], [place] [address]     Radiology Studies: No results found.    Addis Tuohy T. Maryama Kuriakose [facility]  If 7PM-7AM, please contact night-coverage [email or web address] 05/16/2021, 12:52 PM

## 2021-05-16 NOTE — Progress Notes (Signed)
Progress Note  Patient Name: Andres Richardson Date of Encounter: 05/16/2021  Primary Cardiologist: Parke Poisson, MD  Subjective   No chest pain or shortness of breath.  Inpatient Medications    Scheduled Meds:  amoxicillin-clavulanate  1 tablet Oral Q12H   aspirin EC  81 mg Oral Daily   atorvastatin  40 mg Oral Daily   insulin aspart  0-15 Units Subcutaneous TID WC   insulin aspart protamine- aspart  15 Units Subcutaneous BID WC   metoprolol succinate  12.5 mg Oral Daily   sodium chloride flush  3 mL Intravenous Q12H   Continuous Infusions:  sodium chloride     heparin 2,150 Units/hr (05/16/21 0800)   PRN Meds: sodium chloride, acetaminophen **OR** acetaminophen, metoprolol tartrate, ondansetron **OR** ondansetron (ZOFRAN) IV, sodium chloride flush   Vital Signs    Vitals:   05/16/21 0000 05/16/21 0144 05/16/21 0544 05/16/21 0801  BP: 129/60 (!) 160/75 125/70 (!) 147/89  Pulse:  96 98 89  Resp:  18 18   Temp: 98 F (36.7 C) 98.6 F (37 C) 98.4 F (36.9 C) 97.8 F (36.6 C)  TempSrc:  Oral Oral Oral  SpO2:  96% 97% 99%  Weight:  100.5 kg    Height:        Intake/Output Summary (Last 24 hours) at 05/16/2021 1046 Last data filed at 05/16/2021 0918 Gross per 24 hour  Intake 1627.59 ml  Output 4150 ml  Net -2522.41 ml   Filed Weights   05/15/21 0427 05/15/21 1247 05/16/21 0144  Weight: 99 kg 101.5 kg 100.5 kg    Telemetry    Sinus rhythm.  Personally reviewed.  ECG    An ECG dated 05/14/2021 was personally reviewed today and demonstrated:  Sinus tachycardia with burst of PACs and nonspecific ST segment depression.  Physical Exam   GEN: No acute distress.   Neck: No JVD. Cardiac: RRR, no murmur, rub, or gallop.  Respiratory: Nonlabored. Clear to auscultation bilaterally. GI: Soft, nontender, bowel sounds present. MS: No edema; No deformity. Neuro:  Nonfocal. Psych: Alert and oriented x 3. Normal affect.  Labs    Chemistry Recent Labs  Lab  05/10/21 [phone] 05/10/21 0815 05/14/21 0343 05/15/21 0430 05/15/21 0541 05/16/21 0319  NA 139   < > 140 142 143 136  K 3.7   < > 3.9 4.8 3.9 3.6  CL 105   < > 109 110  --  104  CO2 17*   < > 22 20*  --  24  GLUCOSE 436*   < > 365* 237*  --  326*  BUN 18   < > 16 16  --  15  CREATININE 1.10   < > 1.01 1.16  --  1.15  CALCIUM 8.5*   < > 8.3* 9.0  --  8.3*  PROT 7.4  --   --  7.7  --   --   ALBUMIN 3.6  --   --  3.7  --   --   AST 20  --   --  36  --   --   ALT 18  --   --  32  --   --   ALKPHOS 64  --   --  72  --   --   BILITOT 0.2*  --   --  0.2*  --   --   GFRNONAA >60   < > >60 >60  --  >60  ANIONGAP 17*   < >  9 12  --  8   < > = values in this interval not displayed.     Hematology Recent Labs  Lab 05/14/21 0343 05/15/21 0430 05/15/21 0541 05/16/21 0319  WBC 4.7 12.0*  --  5.9  RBC 3.40* 4.27  --  3.69*  HGB 8.8* 11.1* 8.8* 9.6*  HCT 28.9* 37.4* 26.0* 31.8*  MCV 85.0 87.6  --  86.2  MCH 25.9* 26.0  --  26.0  MCHC 30.4 29.7*  --  30.2  RDW 16.3* 16.6*  --  16.7*  PLT 232 456*  --  251    Cardiac Enzymes Recent Labs  Lab 05/10/21 1025 05/10/21 1635 05/11/21 0804 05/15/21 0430 05/15/21 0600  TROPONINIHS 893* 1,326* 1,359* 176* 223*    BNP Recent Labs  Lab 05/10/21 0808 05/15/21 0430  BNP 301.8* 405.1*     Radiology    DG Chest Portable 1 View  Result Date: 05/15/2021 CLINICAL DATA:  Shortness of breath EXAM: PORTABLE CHEST 1 VIEW COMPARISON:  05/11/2021, 05/10/2021 FINDINGS: Increased interstitial prominence with patchy opacities bilaterally. No significant pleural effusion. Cardiomediastinal contours are within normal limits. IMPRESSION: Increased interstitial prominence with patchy opacities bilaterally. May reflect edema or recurrent pneumonia. Appearance is similar to initial 05/10/2021 radiograph. Electronically Signed   By: Guadlupe Spanish M.D.   On: 05/15/2021 05:22    Cardiac Studies   Echocardiogram 05/11/2021:  1. Left ventricular ejection  fraction, by estimation, is 50 to 55%. The  left ventricle has low normal function. The left ventricle demonstrates  regional wall motion abnormalities (see scoring diagram/findings for  description). There is moderate left  ventricular hypertrophy. Left ventricular diastolic parameters are  consistent with Grade I diastolic dysfunction (impaired relaxation). There  is moderate hypokinesis of the left ventricular, basal inferior wall and  inferolateral wall.   2. Right ventricular systolic function is normal. The right ventricular  size is normal. There is normal pulmonary artery systolic pressure. The  estimated right ventricular systolic pressure is 31.4 mmHg.   3. The mitral valve is grossly normal. Trivial mitral valve  regurgitation.   4. The aortic valve was not well visualized. Aortic valve regurgitation  is trivial.   5. The inferior vena cava is dilated in size with >50% respiratory  variability, suggesting right atrial pressure of 8 mmHg.  Assessment & Plan    1.  NSTEMI during recent hospitalization, peak troponin I 1359 on September 26.  Currently chest pain-free.  2.  Type 2 diabetes mellitus.  Hemoglobin A1c 6.8%.  He is on Glucophage XR, Glucotrol XL, and insulin as an outpatient.  With history of [place] gangrene, not a good candidate for SGLT2 inhibitor.  3.  Tobacco abuse in remission.  4.  PAD with known aortoiliac disease.  5.  Mixed hyperlipidemia, on Lipitor.  LDL 44.  6.  Recent hospitalization with aspiration pneumonia versus chemical pneumonitis, completing course of antibiotics.  Afebrile.  7.  Chronic anemia normal MCV.  Hemoglobin is stable.  Patient is scheduled for a cardiac catheterization on Monday, risks and benefits have already been reviewed.  He remains ready to proceed.  Continue aspirin, Lipitor, Toprol-XL, and heparin.  Signed, Nona Dell, MD  05/16/2021, 10:46 AM

## 2021-05-16 NOTE — Progress Notes (Signed)
ANTICOAGULATION CONSULT NOTE - Follow-Up Consult  Pharmacy Consult for Heparin Indication: chest pain/ACS  Allergies  Allergen Reactions   Adhesive [Tape] Rash    Patient Measurements: Height: 5\' 11"  (180.3 cm) Weight: 100.5 kg (221 lb 9.6 oz) IBW/kg (Calculated) : 75.3 Heparin Dosing Weight: 96 kg  Vital Signs: Temp: 98.7 F (37.1 C) (10/01 1935) Temp Source: Oral (10/01 1935) BP: 142/81 (10/01 1935) Pulse Rate: 93 (10/01 1935)  Labs: Recent Labs    05/14/21 0343 05/15/21 0430 05/15/21 0541 05/15/21 0600 05/15/21 1358 05/16/21 0319 05/16/21 1053 05/16/21 1938  HGB 8.8* 11.1* 8.8*  --   --  9.6*  --   --   HCT 28.9* 37.4* 26.0*  --   --  31.8*  --   --   PLT 232 456*  --   --   --  251  --   --   HEPARINUNFRC  --   --   --   --    < > <0.10* 0.36 0.27*  CREATININE 1.01 1.16  --   --   --  1.15  --   --   TROPONINIHS  --  176*  --  223*  --   --   --   --    < > = values in this interval not displayed.     Estimated Creatinine Clearance: 87.7 mL/min (by C-G formula based on SCr of 1.15 mg/dL).   Medical History: Past Medical History:  Diagnosis Date   Diabetes mellitus without complication (HCC)    Type !! 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 Diabetic neuropathy.   Hyperlipidemia    Mixed   Hypertension    Essential   Microalbuminuria    Obesity    Peripheral arterial disease (HCC)    With Claudication   Tobacco use disorder     Assessment: 55 y.o. M presents with SOB. Trop up to 223. Just d/c on 9/29 from [facility] after being treated for PNA. Initial plan was for outpatient cardiac cath.  Now to start heparin for ACS and likely cath 10/3. No AC PTA.   Heparin level just below goal at 0.27 despite rate increase. No issues with line or bleeding per RN.    Goal of Therapy:  Heparin level 0.3-0.7 units/ml Monitor platelets by anticoagulation protocol: Yes   Plan:  Increase Heparin to 2300 units/hr Monitor daily HL, CBC/plt Monitor for  signs/symptoms of bleeding    Thank you for including pharmacy in the care of this patient.  12/3, PharmD, BCPS, BCCP Clinical Pharmacist  Please check AMION for all [facility] phone numbers After 10:00 PM, call Main Pharmacy [phone]

## 2021-05-16 NOTE — Progress Notes (Signed)
Pt states he does not need BiPAP tonight.

## 2021-05-16 NOTE — Progress Notes (Signed)
ANTICOAGULATION CONSULT NOTE - Follow-Up Consult  Pharmacy Consult for Heparin Indication: chest pain/ACS  Allergies  Allergen Reactions   Adhesive [Tape] Rash    Patient Measurements: Height: 5\' 11"  (180.3 cm) Weight: 100.5 kg (221 lb 9.6 oz) IBW/kg (Calculated) : 75.3 Heparin Dosing Weight: 96 kg  Vital Signs: Temp: 98.6 F (37 C) (10/01 0144) Temp Source: Oral (10/01 0144) BP: 160/75 (10/01 0144) Pulse Rate: 96 (10/01 0144)  Labs: Recent Labs    05/14/21 0343 05/15/21 0430 05/15/21 0541 05/15/21 0600 05/15/21 1358 05/15/21 2131 05/16/21 0319  HGB 8.8* 11.1* 8.8*  --   --   --  9.6*  HCT 28.9* 37.4* 26.0*  --   --   --  31.8*  PLT 232 456*  --   --   --   --  251  HEPARINUNFRC  --   --   --   --  <0.10* <0.10* <0.10*  CREATININE 1.01 1.16  --   --   --   --  1.15  TROPONINIHS  --  176*  --  223*  --   --   --      Estimated Creatinine Clearance: 87.7 mL/min (by C-G formula based on SCr of 1.15 mg/dL).   Medical History: Past Medical History:  Diagnosis Date   Diabetes mellitus without complication (HCC)    Type !! 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 Diabetic neuropathy.   Hyperlipidemia    Mixed   Hypertension    Essential   Microalbuminuria    Obesity    Peripheral arterial disease (HCC)    With Claudication   Tobacco use disorder     Assessment: 55 y.o. M presents with SOB. Trop up to 223. Just d/c on 9/29 from [facility] after being treated for PNA. Initial plan was for outpatient cardiac cath.  Now to start heparin for ACS and likely cath. No AC PTA. Hgb 8.8 (low but stable over past few days).   Heparin level remains undetectable on gtt at 1850 units/hr. No issues with line or bleeding reported per RN. Hgb up to 9.6.   Goal of Therapy:  Heparin level 0.3-0.7 units/ml Monitor platelets by anticoagulation protocol: Yes   Plan:  Rebolus heparin 3000 units Increase Heparin to 2150 units/hr F/u 6 hr heparin level  2151, PharmD,  BCPS Please see amion for complete clinical pharmacist phone list 05/16/2021 4:59 AM

## 2021-05-16 NOTE — Progress Notes (Signed)
ANTICOAGULATION CONSULT NOTE - Follow-Up Consult  Pharmacy Consult for Heparin Indication: chest pain/ACS  Allergies  Allergen Reactions   Adhesive [Tape] Rash    Patient Measurements: Height: 5\' 11"  (180.3 cm) Weight: 100.5 kg (221 lb 9.6 oz) IBW/kg (Calculated) : 75.3 Heparin Dosing Weight: 96 kg  Vital Signs: Temp: 97.6 F (36.4 C) (10/01 1207) Temp Source: Oral (10/01 1207) BP: 138/65 (10/01 1207) Pulse Rate: 86 (10/01 1207)  Labs: Recent Labs    05/14/21 0343 05/15/21 0430 05/15/21 0541 05/15/21 0600 05/15/21 1358 05/15/21 2131 05/16/21 0319 05/16/21 1053  HGB 8.8* 11.1* 8.8*  --   --   --  9.6*  --   HCT 28.9* 37.4* 26.0*  --   --   --  31.8*  --   PLT 232 456*  --   --   --   --  251  --   HEPARINUNFRC  --   --   --   --    < > <0.10* <0.10* 0.36  CREATININE 1.01 1.16  --   --   --   --  1.15  --   TROPONINIHS  --  176*  --  223*  --   --   --   --    < > = values in this interval not displayed.     Estimated Creatinine Clearance: 87.7 mL/min (by C-G formula based on SCr of 1.15 mg/dL).   Medical History: Past Medical History:  Diagnosis Date   Diabetes mellitus without complication (HCC)    Type !! 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 Diabetic neuropathy.   Hyperlipidemia    Mixed   Hypertension    Essential   Microalbuminuria    Obesity    Peripheral arterial disease (HCC)    With Claudication   Tobacco use disorder     Assessment: 55 y.o. M presents with SOB. Trop up to 223. Just d/c on 9/29 from [facility] after being treated for PNA. Initial plan was for outpatient cardiac cath.  Now to start heparin for ACS and likely cath. No AC PTA.   Heparin level at low end of goal 0.36 on 2150 units/hr. No issues with line or bleeding reported per RN. Hgb up to 9.6. Will empirically increase heparin drip to ensure level does not fall below goal.   Goal of Therapy:  Heparin level 0.3-0.7 units/ml Monitor platelets by anticoagulation protocol:  Yes   Plan:  Increase Heparin to 2200 units/hr Heparin level in 8h Daily heparin level, CBC  Thank you for including pharmacy in the care of this patient.  2151, PharmD PGY1 Acute Care Pharmacy Resident  Phone: [phone] 05/16/2021  12:14 PM  Please check [email or web address] for unit-specific pharmacy phone numbers.

## 2021-05-17 DIAGNOSIS — I214 Non-ST elevation (NSTEMI) myocardial infarction: Secondary | ICD-10-CM

## 2021-05-17 LAB — HEPARIN LEVEL (UNFRACTIONATED)
Heparin Unfractionated: 0.31 IU/mL (ref 0.30–0.70)
Heparin Unfractionated: 0.29 IU/mL — ABNORMAL LOW (ref 0.30–0.70)
Heparin Unfractionated: 0.4 IU/mL (ref 0.30–0.70)

## 2021-05-17 LAB — CBC
HCT: 30.7 % — ABNORMAL LOW (ref 39.0–52.0)
Hemoglobin: 9.4 g/dL — ABNORMAL LOW (ref 13.0–17.0)
MCH: 26 pg (ref 26.0–34.0)
MCHC: 30.6 g/dL (ref 30.0–36.0)
MCV: 84.8 fL (ref 80.0–100.0)
Platelets: 247 10*3/uL (ref 150–400)
RBC: 3.62 MIL/uL — ABNORMAL LOW (ref 4.22–5.81)
RDW: 16.7 % — ABNORMAL HIGH (ref 11.5–15.5)
WBC: 5.5 10*3/uL (ref 4.0–10.5)
nRBC: 0 % (ref 0.0–0.2)

## 2021-05-17 LAB — IRON AND TIBC
Iron: 65 ug/dL (ref 45–182)
Saturation Ratios: 20 % (ref 17.9–39.5)
TIBC: 329 ug/dL (ref 250–450)
UIBC: 264 ug/dL

## 2021-05-17 LAB — VITAMIN B12: Vitamin B-12: 289 pg/mL (ref 180–914)

## 2021-05-17 LAB — FOLATE: Folate: 8.5 ng/mL (ref >5.9)

## 2021-05-17 LAB — RENAL FUNCTION PANEL
Albumin: 2.9 g/dL — ABNORMAL LOW (ref 3.5–5.0)
Anion gap: 10 (ref 5–15)
BUN: 17 mg/dL (ref 6–20)
CO2: 22 mmol/L (ref 22–32)
Calcium: 8.7 mg/dL — ABNORMAL LOW (ref 8.9–10.3)
Chloride: 104 mmol/L (ref 98–111)
Creatinine, Ser: 1.03 mg/dL (ref 0.61–1.24)
GFR, Estimated: >60 mL/min (ref >60)
Glucose, Bld: 309 mg/dL — ABNORMAL HIGH (ref 70–99)
Phosphorus: 3.3 mg/dL (ref 2.5–4.6)
Potassium: 3.9 mmol/L (ref 3.5–5.1)
Sodium: 136 mmol/L (ref 135–145)

## 2021-05-17 LAB — GLUCOSE, CAPILLARY
Glucose-Capillary: 291 mg/dL — ABNORMAL HIGH (ref 70–99)
Glucose-Capillary: 289 mg/dL — ABNORMAL HIGH (ref 70–99)
Glucose-Capillary: 276 mg/dL — ABNORMAL HIGH (ref 70–99)
Glucose-Capillary: 231 mg/dL — ABNORMAL HIGH (ref 70–99)

## 2021-05-17 LAB — RETICULOCYTES
Immature Retic Fract: 29.4 % — ABNORMAL HIGH (ref 2.3–15.9)
RBC.: 3.55 MIL/uL — ABNORMAL LOW (ref 4.22–5.81)
Retic Count, Absolute: 120.7 10*3/uL (ref 19.0–186.0)
Retic Ct Pct: 3.4 % — ABNORMAL HIGH (ref 0.4–3.1)

## 2021-05-17 LAB — FERRITIN: Ferritin: 148 ng/mL (ref 24–336)

## 2021-05-17 LAB — BRAIN NATRIURETIC PEPTIDE: B Natriuretic Peptide: 165.3 pg/mL — ABNORMAL HIGH (ref 0.0–100.0)

## 2021-05-17 LAB — MAGNESIUM: Magnesium: 1.6 mg/dL — ABNORMAL LOW (ref 1.7–2.4)

## 2021-05-17 MED ORDER — INSULIN ASPART 100 UNIT/ML IJ SOLN
0.0000 [IU] | Freq: Every day | INTRAMUSCULAR | Status: DC
  Administered 2021-05-17: 2 [IU] via SUBCUTANEOUS

## 2021-05-17 MED ORDER — SODIUM CHLORIDE 0.9 % WEIGHT BASED INFUSION
1.0000 mL/kg/h | INTRAVENOUS | Status: DC
  Administered 2021-05-18 (×2): 1 mL/kg/h via INTRAVENOUS

## 2021-05-17 MED ORDER — MAGNESIUM SULFATE 2 GM/50ML IV SOLN
2.0000 g | Freq: Once | INTRAVENOUS | Status: AC
  Administered 2021-05-17: 2 g via INTRAVENOUS
  Filled 2021-05-17: qty 50

## 2021-05-17 MED ORDER — SODIUM CHLORIDE 0.9 % WEIGHT BASED INFUSION
3.0000 mL/kg/h | INTRAVENOUS | Status: DC
  Administered 2021-05-18: 3 mL/kg/h via INTRAVENOUS

## 2021-05-17 MED ORDER — INSULIN ASPART 100 UNIT/ML IJ SOLN
0.0000 [IU] | Freq: Three times a day (TID) | INTRAMUSCULAR | Status: DC
  Administered 2021-05-17 (×2): 8 [IU] via SUBCUTANEOUS
  Administered 2021-05-18: 5 [IU] via SUBCUTANEOUS
  Administered 2021-05-18: 2 [IU] via SUBCUTANEOUS
  Administered 2021-05-18: 5 [IU] via SUBCUTANEOUS
  Administered 2021-05-19: 8 [IU] via SUBCUTANEOUS
  Administered 2021-05-19: 5 [IU] via SUBCUTANEOUS

## 2021-05-17 MED ORDER — INSULIN GLARGINE-YFGN 100 UNIT/ML ~~LOC~~ SOLN
30.0000 [IU] | Freq: Every day | SUBCUTANEOUS | Status: DC
  Administered 2021-05-17 – 2021-05-19 (×3): 30 [IU] via SUBCUTANEOUS
  Filled 2021-05-17 (×3): qty 0.3

## 2021-05-17 MED ORDER — INSULIN ASPART 100 UNIT/ML IJ SOLN
4.0000 [IU] | Freq: Three times a day (TID) | INTRAMUSCULAR | Status: DC
  Administered 2021-05-17 – 2021-05-19 (×6): 4 [IU] via SUBCUTANEOUS

## 2021-05-17 MED ORDER — ASPIRIN 81 MG PO CHEW
81.0000 mg | CHEWABLE_TABLET | ORAL | Status: AC
  Administered 2021-05-18: 81 mg via ORAL
  Filled 2021-05-17: qty 1

## 2021-05-17 MED ORDER — INSULIN ASPART 100 UNIT/ML IJ SOLN: 0.0000 [IU] | Freq: Three times a day (TID) | INTRAMUSCULAR | Status: DC

## 2021-05-17 MED ORDER — SODIUM CHLORIDE 0.9% FLUSH: 3.0000 mL | Freq: Two times a day (BID) | INTRAVENOUS | Status: DC

## 2021-05-17 MED ORDER — SODIUM CHLORIDE 0.9% FLUSH: 3.0000 mL | INTRAVENOUS | Status: DC | PRN

## 2021-05-17 MED ORDER — SODIUM CHLORIDE 0.9 % IV SOLN: 250.0000 mL | INTRAVENOUS | Status: DC | PRN

## 2021-05-17 NOTE — Progress Notes (Signed)
ANTICOAGULATION CONSULT NOTE - Follow-Up Consult  Pharmacy Consult for Heparin Indication: chest pain/ACS  Allergies  Allergen Reactions   Adhesive [Tape] Rash    Patient Measurements: Height: 5\' 11"  (180.3 cm) Weight: 100.7 kg (221 lb 14.4 oz) IBW/kg (Calculated) : 75.3 Heparin Dosing Weight: 96 kg  Vital Signs: Temp: 97.9 F (36.6 C) (10/02 0336) Temp Source: Oral (10/02 0336) BP: 156/71 (10/02 0336) Pulse Rate: 91 (10/02 0336)  Labs: Recent Labs    05/15/21 0430 05/15/21 0541 05/15/21 0600 05/15/21 1358 05/16/21 0319 05/16/21 1053 05/16/21 1938 05/17/21 0325  HGB 11.1* 8.8*  --   --  9.6*  --   --  9.4*  HCT 37.4* 26.0*  --   --  31.8*  --   --  30.7*  PLT 456*  --   --   --  251  --   --  247  HEPARINUNFRC  --   --   --    < > <0.10* 0.36 0.27* 0.31  CREATININE 1.16  --   --   --  1.15  --   --  1.03  TROPONINIHS 176*  --  223*  --   --   --   --   --    < > = values in this interval not displayed.     Estimated Creatinine Clearance: 98 mL/min (by C-G formula based on SCr of 1.03 mg/dL).   Medical History: Past Medical History:  Diagnosis Date   Diabetes mellitus without complication (HCC)    Type !! 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 Diabetic neuropathy.   Hyperlipidemia    Mixed   Hypertension    Essential   Microalbuminuria    Obesity    Peripheral arterial disease (HCC)    With Claudication   Tobacco use disorder     Assessment: 55 y.o. M presents with SOB. Trop up to 223. Just d/c on 9/29 from [facility] after being treated for PNA. Initial plan was for outpatient cardiac cath.  Now to start heparin for ACS and likely cath 10/3. No AC PTA.   Heparin level at low end of goal (0.31) on gtt at 2300 units/hr. No issues with bleeding.   Goal of Therapy:  Heparin level 0.3-0.7 units/ml Monitor platelets by anticoagulation protocol: Yes   Plan:  Increase Heparin slightly to 2350 units/hr to keep in range F/u 6 hr confirmatory heparin  level  12/3, PharmD, BCPS Please see amion for complete clinical pharmacist phone list 05/17/2021 5:23 AM

## 2021-05-17 NOTE — Progress Notes (Signed)
ANTICOAGULATION CONSULT NOTE   Pharmacy Consult for Heparin Indication: chest pain/ACS  Allergies  Allergen Reactions   Adhesive [Tape] Rash    Patient Measurements: Height: 5\' 11"  (180.3 cm) Weight: 100.7 kg (221 lb 14.4 oz) IBW/kg (Calculated) : 75.3 Heparin Dosing Weight: 96 kg  Vital Signs: Temp: 97.9 F (36.6 C) (10/02 1930) Temp Source: Oral (10/02 1930) BP: 138/66 (10/02 1930) Pulse Rate: 94 (10/02 1930)  Labs: Recent Labs    05/15/21 0430 05/15/21 0541 05/15/21 0600 05/15/21 1358 05/16/21 0319 05/16/21 1053 05/17/21 0325 05/17/21 1038 05/17/21 1940  HGB 11.1* 8.8*  --   --  9.6*  --  9.4*  --   --   HCT 37.4* 26.0*  --   --  31.8*  --  30.7*  --   --   PLT 456*  --   --   --  251  --  247  --   --   HEPARINUNFRC  --   --   --    < > <0.10*   < > 0.31 0.29* 0.40  CREATININE 1.16  --   --   --  1.15  --  1.03  --   --   TROPONINIHS 176*  --  223*  --   --   --   --   --   --    < > = values in this interval not displayed.     Estimated Creatinine Clearance: 98 mL/min (by C-G formula based on SCr of 1.03 mg/dL).   Medical History: Past Medical History:  Diagnosis Date   Diabetes mellitus without complication (HCC)    Type !! 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 Diabetic neuropathy.   Hyperlipidemia    Mixed   Hypertension    Essential   Microalbuminuria    Obesity    Peripheral arterial disease (HCC)    With Claudication   Tobacco use disorder     Assessment: 55 y.o. M presents with SOB. Trop up to 223. Just d/c on 9/29 from [facility] after being treated for PNA. Initial plan was for outpatient cardiac cath.  Now to start heparin for ACS and likely cath. No AC PTA.   Heparin level therapeutic s/p rate increase to 2400 units/hr   Goal of Therapy:  Heparin level 0.3-0.7 units/ml Monitor platelets by anticoagulation protocol: Yes   Plan:  Continue heparin gtt at 2400 units/hr F/u heparin level with AM labs to confirm F/u Cath plans and  LOT  [facility], PharmD Clinical Pharmacist ED Pharmacist Phone # [phone] 05/17/2021 8:54 PM

## 2021-05-17 NOTE — Progress Notes (Signed)
ANTICOAGULATION CONSULT NOTE - Follow-Up Consult  Pharmacy Consult for Heparin Indication: chest pain/ACS  Allergies  Allergen Reactions   Adhesive [Tape] Rash    Patient Measurements: Height: 5\' 11"  (180.3 cm) Weight: 100.7 kg (221 lb 14.4 oz) IBW/kg (Calculated) : 75.3 Heparin Dosing Weight: 96 kg  Vital Signs: Temp: 99 F (37.2 C) (10/02 1156) Temp Source: Oral (10/02 1156) BP: 137/71 (10/02 1156) Pulse Rate: 92 (10/02 1156)  Labs: Recent Labs    05/15/21 0430 05/15/21 0541 05/15/21 0600 05/15/21 1358 05/16/21 0319 05/16/21 1053 05/16/21 1938 05/17/21 0325 05/17/21 1038  HGB 11.1* 8.8*  --   --  9.6*  --   --  9.4*  --   HCT 37.4* 26.0*  --   --  31.8*  --   --  30.7*  --   PLT 456*  --   --   --  251  --   --  247  --   HEPARINUNFRC  --   --   --    < > <0.10*   < > 0.27* 0.31 0.29*  CREATININE 1.16  --   --   --  1.15  --   --  1.03  --   TROPONINIHS 176*  --  223*  --   --   --   --   --   --    < > = values in this interval not displayed.     Estimated Creatinine Clearance: 98 mL/min (by C-G formula based on SCr of 1.03 mg/dL).   Medical History: Past Medical History:  Diagnosis Date   Diabetes mellitus without complication (HCC)    Type !! 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 Diabetic neuropathy.   Hyperlipidemia    Mixed   Hypertension    Essential   Microalbuminuria    Obesity    Peripheral arterial disease (HCC)    With Claudication   Tobacco use disorder     Assessment: 55 y.o. M presents with SOB. Trop up to 223. Just d/c on 9/29 from [facility] after being treated for PNA. Initial plan was for outpatient cardiac cath.  Now to start heparin for ACS and likely cath. No AC PTA.   Heparin level subtherapeutic at 0.29 on 2350 units/hr. No issues with line or bleeding reported per RN. Hgb stable at 9.4. Since patient is slightly below goal, will increase rate.   Goal of Therapy:  Heparin level 0.3-0.7 units/ml Monitor platelets by  anticoagulation protocol: Yes   Plan:  Increase Heparin to 2400 units/hr Heparin level in 8h Daily heparin level, CBC  Thank you for including pharmacy in the care of this patient.  [facility], PharmD PGY1 Acute Care Pharmacy Resident  Phone: [phone] 05/17/2021  1:07 PM  Please check [email or web address] for unit-specific pharmacy phone numbers.

## 2021-05-17 NOTE — Progress Notes (Signed)
PROGRESS NOTE  Andres Richardson:[id number] DOB: 1966-08-11   PCP: [name], MD  Patient is from: Home.  Independently ambulates at baseline.  DOA: 05/15/2021 LOS: 2  Chief complaints:  Chief Complaint  Patient presents with   Shortness of Breath     Brief Narrative / Interim history: 55 year old M with PMH of IDDM-2, former 80-pack-year history of smoking (quit in 02/2021), Fournier's gangrene s/p debridement and loop sigmoid colostomy in 02/2021, and most recent hospitalization from 9/25-9/29 for acute hypoxemic and hypercapnic RF and sepsis in the setting of aspiration pneumonia and chemical pneumonitis, non-STEMI and DKA returning with acute chest pain and dyspnea about 4 AM while avoiding that prompted him to call EMS.  Reportedly, patient was anxious about her cauterization previous hospitalization.   In ED, he was slightly hypertensive.  He was tachypneic to 36 with saturation of 90%.  BNP 405.  Trop 176>> 223.  He was placed on BiPAP, IV Lasix, nitro drip and heparin, and admitted for chest pain with elevated troponin/possible non-STEMI and CHF exacerbation.  Cardiology consulted and planning heart catheterization on 10/3.   Subjective: Seen and examined earlier this morning.  No major events overnight of this morning.  No complaints other than some swelling in his legs and arms.  Denies chest pain, dyspnea, cough, DOE, orthopnea or PND.  Denies GI or UTI symptoms.  Objective: Vitals:   05/16/21 1935 05/17/21 0000 05/17/21 0336 05/17/21 1156  BP: (!) 142/81  (!) 156/71 137/71  Pulse: 93  91 92  Resp: 18  19   Temp: 98.7 F (37.1 C)  97.9 F (36.6 C) 99 F (37.2 C)  TempSrc: Oral  Oral Oral  SpO2: 100%  98% 100%  Weight:  100.7 kg    Height:        Intake/Output Summary (Last 24 hours) at 05/17/2021 1230 Last data filed at 05/17/2021 0842 Gross per 24 hour  Intake 657.3 ml  Output 200 ml  Net 457.3 ml   Filed Weights   05/15/21 1247 05/16/21 0144 05/17/21 0000   Weight: 101.5 kg 100.5 kg 100.7 kg    Examination:  GENERAL: No apparent distress.  Nontoxic. HEENT: MMM.  Vision and hearing grossly intact.  NECK: Supple.  No apparent JVD.  RESP: 100% on RA.  No IWOB.  Fair aeration bilaterally. CVS:  RRR. Heart sounds normal.  ABD/GI/GU: BS+. Abd soft, NTND.  MSK/EXT:  Moves extremities. No apparent deformity.  1+ edema in BLE. SKIN: no apparent skin lesion or wound NEURO: Awake and alert. Oriented appropriately.  No apparent focal neuro deficit. PSYCH: Calm. Normal affect.   Procedures:  None  Microbiology summarized: COVID-19 and influenza PCR nonreactive.  Assessment & Plan: Acute respiratory failure with hypoxia: Likely due to CHF or undiagnosed COPD and OSA. Desaturated to 88% while asleep the day of admission.  Carries 80-pack-year history of smoking.  Now 100% on room air. -Needs outpatient PFTs and sleep study -Manage CHF as below -CPAP/BiPAP at night  Elevated troponin/recent NSTEMI: Presents with acute chest pain and dyspnea.  Troponin [id number] (peaked at 1326 prior to admission when he declined heart cath).  EKG with sinus tachycardia at 140s with what looks like PACs.  TTE on 9/26 with LVEF of 50 to 55%, R WMA, G1 DD, RVSP of 31.  Chest pain, dyspnea and tachycardia has resolved with diuretics.  Chest x-ray improved. -Plan for heart catheterization on 10/3 -Continue IV heparin, metoprolol, ASA  -Manage CHF as below   Acute  diastolic CHF: TTE as above.  BNP 405 (slightly higher than prior admission).  Significant improvement in his symptoms and CXR after a dose of IV Lasix.  UOP 1.2 L / 24 hours off diuretics.  He is net -4.2 L.  Cr stable.  Still with significant BLE edema. -Seems cardiology is holding of diuretics for heart cath tomorrow -Monitor fluid status, renal functions and electrolytes. -Sodium and fluid restriction -Leg elevation.  Controlled IDDM-2 with hyperglycemia and other vascular complication: A1c 6.8% on  9/28. Recent Labs  Lab 05/16/21 1119 05/16/21 1701 05/16/21 2044 05/17/21 0609 05/17/21 1112  GLUCAP 148* 324* 219* 291* 289*  -Change at 70/32 Lantus -Continue SSI-moderate -Added nightly coverage and mealtime coverage -Continue statin  Normocytic anemia: H&H seems to be at baseline.  Denies melena or hematochezia.  Anemia panel basically normal. Recent Labs    03/08/21 0853 05/10/21 0808 05/10/21 0815 05/11/21 0250 05/13/21 0407 05/14/21 0343 05/15/21 0430 05/15/21 0541 05/16/21 0319 05/17/21 0325  HGB 9.2* 10.9* 12.6* 9.2* 8.8* 8.8* 11.1* 8.8* 9.6* 9.4*  -Continue monitoring  Hyperlipidemia -Continue statin as above   Recent hospitalization for aspiration pneumonia vs. Chemical pneumonitis -Completed course of Augmentin   Recent Fournier's gangrene in July 2022--s/p surgical debridement and ostomy.  -Plan for ostomy reversal in November.  -Appreciate help by WOCN.  Class I obesity Body mass index is 30.95 kg/m.         DVT prophylaxis:    On IV heparin for non-STEMI.  Code Status: Full code Family Communication: Patient and/or RN. Available if any question.  Level of care: Telemetry Cardiac.  Status is: Inpatient  Remains inpatient appropriate because:Ongoing diagnostic testing needed not appropriate for outpatient work up, IV treatments appropriate due to intensity of illness or inability to take PO, and Inpatient level of care appropriate due to severity of illness  Dispo: The patient is from: Home              Anticipated d/c is to: Home              Patient currently is not medically stable to d/c.   Difficult to place patient No       Consultants:  Cardiology   Sch Meds:  Scheduled Meds:  aspirin EC  81 mg Oral Daily   atorvastatin  40 mg Oral Daily   insulin aspart  0-15 Units Subcutaneous TID WC   insulin aspart  0-5 Units Subcutaneous QHS   insulin aspart  4 Units Subcutaneous TID WC   insulin glargine-yfgn  30 Units  Subcutaneous Daily   metoprolol succinate  12.5 mg Oral Daily   sodium chloride flush  3 mL Intravenous Q12H   sodium chloride flush  3 mL Intravenous Q12H   Continuous Infusions:  sodium chloride     heparin 2,350 Units/hr (05/17/21 1001)   PRN Meds:.sodium chloride, acetaminophen **OR** acetaminophen, metoprolol tartrate, ondansetron **OR** ondansetron (ZOFRAN) IV, sodium chloride flush  Antimicrobials: Anti-infectives (From admission, onward)    Start     Dose/Rate Route Frequency Ordered Stop   05/15/21 2200  amoxicillin-clavulanate (AUGMENTIN) 875-125 MG per tablet 1 tablet        1 tablet Oral Every 12 hours 05/15/21 1517 05/16/21 2046        I have personally reviewed the following labs and images: CBC: Recent Labs  Lab 05/13/21 0407 05/14/21 0343 05/15/21 0430 05/15/21 0541 05/16/21 0319 05/17/21 0325  WBC 4.6 4.7 12.0*  --  5.9 5.5  NEUTROABS  --   --  5.6  --   --   --   HGB 8.8* 8.8* 11.1* 8.8* 9.6* 9.4*  HCT 29.5* 28.9* 37.4* 26.0* 31.8* 30.7*  MCV 85.8 85.0 87.6  --  86.2 84.8  PLT 239 232 456*  --  251 247   BMP &GFR Recent Labs  Lab 05/13/21 0407 05/14/21 0343 05/15/21 0430 05/15/21 0541 05/16/21 0319 05/17/21 0325  NA 142 140 142 143 136 136  K 3.9 3.9 4.8 3.9 3.6 3.9  CL 114* 109 110  --  104 104  CO2 21* 22 20*  --  24 22  GLUCOSE 316* 365* 237*  --  326* 309*  BUN 19 16 16   --  15 17  CREATININE 1.14 1.01 1.16  --  1.15 1.03  CALCIUM 8.6* 8.3* 9.0  --  8.3* 8.7*  MG  --   --   --   --   --  1.6*  PHOS  --   --   --   --   --  3.3   Estimated Creatinine Clearance: 98 mL/min (by C-G formula based on SCr of 1.03 mg/dL). Liver & Pancreas: Recent Labs  Lab 05/15/21 0430 05/17/21 0325  AST 36  --   ALT 32  --   ALKPHOS 72  --   BILITOT 0.2*  --   PROT 7.7  --   ALBUMIN 3.7 2.9*   No results for input(s): LIPASE, AMYLASE in the last 168 hours. No results for input(s): AMMONIA in the last 168 hours. Diabetic: No results for  input(s): HGBA1C in the last 72 hours. Recent Labs  Lab 05/16/21 1119 05/16/21 1701 05/16/21 2044 05/17/21 0609 05/17/21 1112  GLUCAP 148* 324* 219* 291* 289*   Cardiac Enzymes: No results for input(s): CKTOTAL, CKMB, CKMBINDEX, TROPONINI in the last 168 hours. No results for input(s): PROBNP in the last 8760 hours. Coagulation Profile: No results for input(s): INR, PROTIME in the last 168 hours.  Thyroid Function Tests: No results for input(s): TSH, T4TOTAL, FREET4, T3FREE, THYROIDAB in the last 72 hours. Lipid Profile: No results for input(s): CHOL, HDL, LDLCALC, TRIG, CHOLHDL, LDLDIRECT in the last 72 hours. Anemia Panel: Recent Labs    05/17/21 0325  VITAMINB12 289  FOLATE 8.5  FERRITIN 148  TIBC 329  IRON 65  RETICCTPCT 3.4*   Urine analysis:    Component Value Date/Time   COLORURINE COLORLESS (A) 05/10/2021 0808   APPEARANCEUR CLEAR 05/10/2021 0808   LABSPEC 1.010 05/10/2021 0808   PHURINE 6.0 05/10/2021 0808   GLUCOSEU 500 (A) 05/10/2021 0808   HGBUR NEGATIVE 05/10/2021 0808   BILIRUBINUR NEGATIVE 05/10/2021 0808   KETONESUR NEGATIVE 05/10/2021 0808   PROTEINUR 100 (A) 05/10/2021 0808   NITRITE NEGATIVE 05/10/2021 0808   LEUKOCYTESUR NEGATIVE 05/10/2021 0808   Sepsis Labs: Invalid input(s): PROCALCITONIN, LACTICIDVEN  Microbiology: Recent Results (from the past 240 hour(s))  Blood Culture (routine x 2)     Status: None   Collection Time: 05/10/21  8:00 AM   Specimen: BLOOD  Result Value Ref Range Status   Specimen Description   Final    BLOOD LEFT UPPER ARM Performed at [facility], [address], [place], [place]    Special Requests   Final    BOTTLES DRAWN AEROBIC AND ANAEROBIC Blood Culture adequate volume Performed at [facility], [address] [place], [place]    Culture   Final    NO GROWTH 5 DAYS Performed at [facility], [address] [place], [place] [address]     Report Status 05/15/2021 FINAL  Final  Blood Culture (routine x 2)     Status: None   Collection Time: 05/10/21  8:07 AM   Specimen: BLOOD RIGHT HAND  Result Value Ref Range Status   Specimen Description   Final    BLOOD RIGHT HAND Performed at [facility], [address] [place], [place] [address]    Special Requests   Final    BOTTLES DRAWN AEROBIC AND ANAEROBIC Blood Culture adequate volume Performed at [facility], [address] [place], [place] [address]    Culture   Final    NO GROWTH 5 DAYS Performed at [facility] [place] [facility], [address] [place], [place] [address]    Report Status 05/15/2021 FINAL  Final  Resp Panel by RT-PCR (Flu A&B, Covid) Nasopharyngeal Swab     Status: None   Collection Time: 05/10/21 10:25 AM   Specimen: Nasopharyngeal Swab; Nasopharyngeal(NP) swabs in vial transport medium  Result Value Ref Range Status   SARS Coronavirus 2 by RT PCR NEGATIVE NEGATIVE Final    Comment: (NOTE) SARS-CoV-2 target nucleic acids are NOT DETECTED.  The SARS-CoV-2 RNA is generally detectable in upper respiratory specimens during the acute phase of infection. The lowest concentration of SARS-CoV-2 viral copies this assay can detect is 138 copies/mL. A negative result does not preclude SARS-Cov-2 infection and should not be used as the sole basis for treatment or other patient management decisions. A negative result may occur with  improper specimen collection/handling, submission of specimen other than nasopharyngeal swab, presence of viral mutation(s) within the areas targeted by this assay, and inadequate number of viral copies(<138 copies/mL). A negative result must be combined with clinical observations, patient history, and epidemiological information. The expected result is Negative.  Fact Sheet for Patients:  BloggerCourse.com  Fact Sheet for Healthcare Providers:   SeriousBroker.it  This test is no t yet approved or cleared by the [place] FDA and  has been authorized for detection and/or diagnosis of SARS-CoV-2 by FDA under an Emergency Use Authorization (EUA). This EUA will remain  in effect (meaning this test can be used) for the duration of the COVID-19 declaration under Section 564(b)(1) of the Act, 21 U.S.C.section 360bbb-3(b)(1), unless the authorization is terminated  or revoked sooner.       Influenza A by PCR NEGATIVE NEGATIVE Final   Influenza B by PCR NEGATIVE NEGATIVE Final    Comment: (NOTE) The Xpert Xpress SARS-CoV-2/FLU/RSV plus assay is intended as an aid in the diagnosis of influenza from Nasopharyngeal swab specimens and should not be used as a sole basis for treatment. Nasal washings and aspirates are unacceptable for Xpert Xpress SARS-CoV-2/FLU/RSV testing.  Fact Sheet for Patients: BloggerCourse.com  Fact Sheet for Healthcare Providers: SeriousBroker.it  This test is not yet approved or cleared by the [place] FDA and has been authorized for detection and/or diagnosis of SARS-CoV-2 by FDA under an Emergency Use Authorization (EUA). This EUA will remain in effect (meaning this test can be used) for the duration of the COVID-19 declaration under Section 564(b)(1) of the Act, 21 U.S.C. section 360bbb-3(b)(1), unless the authorization is terminated or revoked.  Performed at [facility], [address], [place], [place] [address]   MRSA Next Gen by PCR, Nasal     Status: None   Collection Time: 05/10/21 11:51 AM   Specimen: Nasal Mucosa; Nasal Swab  Result Value Ref Range Status  MRSA by PCR Next Gen NOT DETECTED NOT DETECTED Final    Comment: (NOTE) The GeneXpert MRSA Assay (FDA approved for NASAL specimens only), is one component of a comprehensive MRSA colonization surveillance program. It is not intended  to diagnose MRSA infection nor to guide or monitor treatment for MRSA infections. Test performance is not FDA approved in patients less than 71 years old. Performed at [facility], [address], [place], [place] [address]   Resp Panel by RT-PCR (Flu A&B, Covid) Nasopharyngeal Swab     Status: None   Collection Time: 05/15/21  5:45 AM   Specimen: Nasopharyngeal Swab; Nasopharyngeal(NP) swabs in vial transport medium  Result Value Ref Range Status   SARS Coronavirus 2 by RT PCR NEGATIVE NEGATIVE Final    Comment: (NOTE) SARS-CoV-2 target nucleic acids are NOT DETECTED.  The SARS-CoV-2 RNA is generally detectable in upper respiratory specimens during the acute phase of infection. The lowest concentration of SARS-CoV-2 viral copies this assay can detect is 138 copies/mL. A negative result does not preclude SARS-Cov-2 infection and should not be used as the sole basis for treatment or other patient management decisions. A negative result may occur with  improper specimen collection/handling, submission of specimen other than nasopharyngeal swab, presence of viral mutation(s) within the areas targeted by this assay, and inadequate number of viral copies(<138 copies/mL). A negative result must be combined with clinical observations, patient history, and epidemiological information. The expected result is Negative.  Fact Sheet for Patients:  BloggerCourse.com  Fact Sheet for Healthcare Providers:  SeriousBroker.it  This test is no t yet approved or cleared by the [place] FDA and  has been authorized for detection and/or diagnosis of SARS-CoV-2 by FDA under an Emergency Use Authorization (EUA). This EUA will remain  in effect (meaning this test can be used) for the duration of the COVID-19 declaration under Section 564(b)(1) of the Act, 21 U.S.C.section 360bbb-3(b)(1), unless the authorization is terminated  or  revoked sooner.       Influenza A by PCR NEGATIVE NEGATIVE Final   Influenza B by PCR NEGATIVE NEGATIVE Final    Comment: (NOTE) The Xpert Xpress SARS-CoV-2/FLU/RSV plus assay is intended as an aid in the diagnosis of influenza from Nasopharyngeal swab specimens and should not be used as a sole basis for treatment. Nasal washings and aspirates are unacceptable for Xpert Xpress SARS-CoV-2/FLU/RSV testing.  Fact Sheet for Patients: BloggerCourse.com  Fact Sheet for Healthcare Providers: SeriousBroker.it  This test is not yet approved or cleared by the [place] FDA and has been authorized for detection and/or diagnosis of SARS-CoV-2 by FDA under an Emergency Use Authorization (EUA). This EUA will remain in effect (meaning this test can be used) for the duration of the COVID-19 declaration under Section 564(b)(1) of the Act, 21 U.S.C. section 360bbb-3(b)(1), unless the authorization is terminated or revoked.  Performed at [facility], [address], [place], [place] [address]     Radiology Studies: [facility] Chest [facility] 1 View  Result Date: 05/16/2021 CLINICAL DATA:  Shortness of breath. EXAM: PORTABLE CHEST 1 VIEW COMPARISON:  Chest x-ray 05/15/2021. FINDINGS: The heart is enlarged, unchanged. There is been interval resolution of bilateral interstitial prominence. Minimal interstitial opacities persist in the lung bases. There is no new focal lung consolidation, pleural effusion or pneumothorax. No acute fractures are seen. IMPRESSION: 1. Markedly improved bilateral interstitial opacities likely related to resolving edema. No new focal lung infiltrate. Electronically Signed   By: Mcneil Sober.D.  On: 05/16/2021 16:58      Harvie Morua T. Liahm Grivas [facility]  If 7PM-7AM, please contact night-coverage [email or web address] 05/17/2021, 12:30 PM

## 2021-05-17 NOTE — Progress Notes (Signed)
Pt refused BiPAP tonight. 

## 2021-05-17 NOTE — Progress Notes (Signed)
Progress Note  Patient Name: Andres Richardson Date of Encounter: 05/17/2021  Primary Cardiologist: Parke Poisson, MD  Subjective   Sitting in bedside chair.  No chest pain or shortness of breath.  Inpatient Medications    Scheduled Meds:  aspirin EC  81 mg Oral Daily   atorvastatin  40 mg Oral Daily   insulin aspart  0-15 Units Subcutaneous TID WC   insulin aspart  0-5 Units Subcutaneous QHS   insulin aspart  4 Units Subcutaneous TID WC   insulin glargine-yfgn  30 Units Subcutaneous Daily   metoprolol succinate  12.5 mg Oral Daily   sodium chloride flush  3 mL Intravenous Q12H   Continuous Infusions:  sodium chloride     heparin 2,350 Units/hr (05/17/21 1001)   PRN Meds: sodium chloride, acetaminophen **OR** acetaminophen, metoprolol tartrate, ondansetron **OR** ondansetron (ZOFRAN) IV, sodium chloride flush   Vital Signs    Vitals:   05/16/21 1207 05/16/21 1935 05/17/21 0000 05/17/21 0336  BP: 138/65 (!) 142/81  (!) 156/71  Pulse: 86 93  91  Resp: 16 18  19   Temp: 97.6 F (36.4 C) 98.7 F (37.1 C)  97.9 F (36.6 C)  TempSrc: Oral Oral  Oral  SpO2: 100% 100%  98%  Weight:   100.7 kg   Height:        Intake/Output Summary (Last 24 hours) at 05/17/2021 1007 Last data filed at 05/17/2021 0842 Gross per 24 hour  Intake 777.3 ml  Output 200 ml  Net 577.3 ml   Filed Weights   05/15/21 1247 05/16/21 0144 05/17/21 0000  Weight: 101.5 kg 100.5 kg 100.7 kg    Telemetry    Sinus rhythm.  Personally reviewed.  ECG    An ECG dated 05/14/2021 was personally reviewed today and demonstrated:  Sinus tachycardia with burst of PACs and nonspecific ST segment depression.  Physical Exam   GEN: No acute distress.   Neck: No JVD. Cardiac: RRR without gallop or rub.  Respiratory: Nonlabored.  Clear to auscultation. GI: Soft, nontender, bowel sounds present. MS: No pitting edema. Neuro:  Nonfocal. Psych: Alert and oriented x 3. Normal affect.  Labs     Chemistry Recent Labs  Lab 05/15/21 0430 05/15/21 0541 05/16/21 0319 05/17/21 0325  NA 142 143 136 136  K 4.8 3.9 3.6 3.9  CL 110  --  104 104  CO2 20*  --  24 22  GLUCOSE 237*  --  326* 309*  BUN 16  --  15 17  CREATININE 1.16  --  1.15 1.03  CALCIUM 9.0  --  8.3* 8.7*  PROT 7.7  --   --   --   ALBUMIN 3.7  --   --  2.9*  AST 36  --   --   --   ALT 32  --   --   --   ALKPHOS 72  --   --   --   BILITOT 0.2*  --   --   --   GFRNONAA >60  --  >60 >60  ANIONGAP 12  --  8 10     Hematology Recent Labs  Lab 05/15/21 0430 05/15/21 0541 05/16/21 0319 05/17/21 0325  WBC 12.0*  --  5.9 5.5  RBC 4.27  --  3.69* 3.62*  3.55*  HGB 11.1* 8.8* 9.6* 9.4*  HCT 37.4* 26.0* 31.8* 30.7*  MCV 87.6  --  86.2 84.8  MCH 26.0  --  26.0 26.0  MCHC  29.7*  --  30.2 30.6  RDW 16.6*  --  16.7* 16.7*  PLT 456*  --  251 247    Cardiac Enzymes Recent Labs  Lab 05/10/21 1025 05/10/21 1635 05/11/21 0804 05/15/21 0430 05/15/21 0600  TROPONINIHS 893* 1,326* 1,359* 176* 223*    BNP Recent Labs  Lab 05/15/21 0430 05/17/21 0325  BNP 405.1* 165.3*     Radiology    DG Chest Port 1 View  Result Date: 05/16/2021 CLINICAL DATA:  Shortness of breath. EXAM: PORTABLE CHEST 1 VIEW COMPARISON:  Chest x-ray 05/15/2021. FINDINGS: The heart is enlarged, unchanged. There is been interval resolution of bilateral interstitial prominence. Minimal interstitial opacities persist in the lung bases. There is no new focal lung consolidation, pleural effusion or pneumothorax. No acute fractures are seen. IMPRESSION: 1. Markedly improved bilateral interstitial opacities likely related to resolving edema. No new focal lung infiltrate. Electronically Signed   By: Darliss Cheney M.D.   On: 05/16/2021 16:58    Cardiac Studies   Echocardiogram 05/11/2021:  1. Left ventricular ejection fraction, by estimation, is 50 to 55%. The  left ventricle has low normal function. The left ventricle demonstrates  regional  wall motion abnormalities (see scoring diagram/findings for  description). There is moderate left  ventricular hypertrophy. Left ventricular diastolic parameters are  consistent with Grade I diastolic dysfunction (impaired relaxation). There  is moderate hypokinesis of the left ventricular, basal inferior wall and  inferolateral wall.   2. Right ventricular systolic function is normal. The right ventricular  size is normal. There is normal pulmonary artery systolic pressure. The  estimated right ventricular systolic pressure is 31.4 mmHg.   3. The mitral valve is grossly normal. Trivial mitral valve  regurgitation.   4. The aortic valve was not well visualized. Aortic valve regurgitation  is trivial.   5. The inferior vena cava is dilated in size with >50% respiratory  variability, suggesting right atrial pressure of 8 mmHg.  Assessment & Plan    1.  NSTEMI during recent hospitalization, peak troponin I 1359 on September 26.  Currently chest pain-free on medical therapy.  Plan is for diagnostic cardiac catheterization tomorrow.  2.  Type 2 diabetes mellitus.  Hemoglobin A1c 6.8%.  He is on Glucophage XR, Glucotrol XL, and insulin as an outpatient.  With history of [place] gangrene, not a good candidate for SGLT2 inhibitor.  3.  Tobacco abuse in remission.  4.  PAD with known aortoiliac disease.  5.  Mixed hyperlipidemia, on Lipitor.  LDL 44.  6.  Recent hospitalization with aspiration pneumonia versus chemical pneumonitis, has completed course of antibiotics.  Afebrile.  No cough.  7.  Chronic anemia normal MCV.  Hemoglobin is stable at 9.4 today.  Patient is scheduled for a cardiac catheterization on Monday, risks and benefits have already been reviewed.  Continue aspirin, Lipitor, Toprol-XL, and heparin.  Signed, Nona Dell, MD  05/17/2021, 10:07 AM

## 2021-05-18 ENCOUNTER — Encounter (HOSPITAL_COMMUNITY)
Admission: EM | Disposition: A | Discharge status: Home / Self Care | Payer: Self-pay | Source: Home / Self Care | Attending: Student

## 2021-05-18 DIAGNOSIS — E785 Hyperlipidemia, unspecified: Secondary | ICD-10-CM

## 2021-05-18 DIAGNOSIS — I2510 Atherosclerotic heart disease of native coronary artery without angina pectoris: Secondary | ICD-10-CM

## 2021-05-18 HISTORY — PX: LEFT HEART CATH AND CORONARY ANGIOGRAPHY: CATH118249

## 2021-05-18 LAB — RENAL FUNCTION PANEL
Albumin: 2.9 g/dL — ABNORMAL LOW (ref 3.5–5.0)
Anion gap: 8 (ref 5–15)
BUN: 17 mg/dL (ref 6–20)
CO2: 25 mmol/L (ref 22–32)
Calcium: 8.6 mg/dL — ABNORMAL LOW (ref 8.9–10.3)
Chloride: 103 mmol/L (ref 98–111)
Creatinine, Ser: 1.22 mg/dL (ref 0.61–1.24)
GFR, Estimated: >60 mL/min (ref >60)
Glucose, Bld: 249 mg/dL — ABNORMAL HIGH (ref 70–99)
Phosphorus: 3.8 mg/dL (ref 2.5–4.6)
Potassium: 3.8 mmol/L (ref 3.5–5.1)
Sodium: 136 mmol/L (ref 135–145)

## 2021-05-18 LAB — CBC
HCT: 31.5 % — ABNORMAL LOW (ref 39.0–52.0)
Hemoglobin: 9.5 g/dL — ABNORMAL LOW (ref 13.0–17.0)
MCH: 26.1 pg (ref 26.0–34.0)
MCHC: 30.2 g/dL (ref 30.0–36.0)
MCV: 86.5 fL (ref 80.0–100.0)
Platelets: 253 10*3/uL (ref 150–400)
RBC: 3.64 MIL/uL — ABNORMAL LOW (ref 4.22–5.81)
RDW: 17.2 % — ABNORMAL HIGH (ref 11.5–15.5)
WBC: 6.4 10*3/uL (ref 4.0–10.5)
nRBC: 0 % (ref 0.0–0.2)

## 2021-05-18 LAB — HEPARIN LEVEL (UNFRACTIONATED): Heparin Unfractionated: 0.4 IU/mL (ref 0.30–0.70)

## 2021-05-18 LAB — MAGNESIUM: Magnesium: 1.7 mg/dL (ref 1.7–2.4)

## 2021-05-18 LAB — GLUCOSE, CAPILLARY
Glucose-Capillary: 243 mg/dL — ABNORMAL HIGH (ref 70–99)
Glucose-Capillary: 209 mg/dL — ABNORMAL HIGH (ref 70–99)
Glucose-Capillary: 123 mg/dL — ABNORMAL HIGH (ref 70–99)
Glucose-Capillary: 156 mg/dL — ABNORMAL HIGH (ref 70–99)

## 2021-05-18 LAB — BRAIN NATRIURETIC PEPTIDE: B Natriuretic Peptide: 108 pg/mL — ABNORMAL HIGH (ref 0.0–100.0)

## 2021-05-18 SURGERY — LEFT HEART CATH AND CORONARY ANGIOGRAPHY
Anesthesia: LOCAL

## 2021-05-18 MED ORDER — IOHEXOL 350 MG/ML SOLN
INTRAVENOUS | Status: DC | PRN
  Administered 2021-05-18: 90 mL via INTRA_ARTERIAL

## 2021-05-18 MED ORDER — LIDOCAINE HCL (PF) 1 % IJ SOLN
INTRAMUSCULAR | Status: DC | PRN
  Administered 2021-05-18: 2 mL via SUBCUTANEOUS

## 2021-05-18 MED ORDER — SODIUM CHLORIDE 0.9 % IV SOLN: 250.0000 mL | INTRAVENOUS | Status: DC | PRN

## 2021-05-18 MED ORDER — ASPIRIN 81 MG PO CHEW
81.0000 mg | CHEWABLE_TABLET | Freq: Every day | ORAL | Status: DC
  Administered 2021-05-19 – 2021-05-22 (×4): 81 mg via ORAL
  Filled 2021-05-18 (×4): qty 1

## 2021-05-18 MED ORDER — VERAPAMIL HCL 2.5 MG/ML IV SOLN
INTRA_ARTERIAL | Status: DC | PRN
  Administered 2021-05-18: 7 mL via INTRA_ARTERIAL

## 2021-05-18 MED ORDER — NITROGLYCERIN 1 MG/10 ML FOR IR/CATH LAB
INTRA_ARTERIAL | Status: AC
  Filled 2021-05-18: qty 10

## 2021-05-18 MED ORDER — ACETAMINOPHEN 325 MG PO TABS: 650.0000 mg | ORAL_TABLET | ORAL | Status: DC | PRN

## 2021-05-18 MED ORDER — FENTANYL CITRATE (PF) 100 MCG/2ML IJ SOLN
INTRAMUSCULAR | Status: DC | PRN
  Administered 2021-05-18: 25 ug via INTRAVENOUS

## 2021-05-18 MED ORDER — ACETAMINOPHEN 325 MG PO TABS: 650.0000 mg | ORAL_TABLET | Freq: Four times a day (QID) | ORAL | Status: DC | PRN

## 2021-05-18 MED ORDER — HEPARIN (PORCINE) IN NACL 1000-0.9 UT/500ML-% IV SOLN
INTRAVENOUS | Status: AC
  Filled 2021-05-18: qty 1000

## 2021-05-18 MED ORDER — ACETAMINOPHEN 650 MG RE SUPP: 650.0000 mg | Freq: Four times a day (QID) | RECTAL | Status: DC | PRN

## 2021-05-18 MED ORDER — SODIUM CHLORIDE 0.9 % IV SOLN: INTRAVENOUS | Status: DC

## 2021-05-18 MED ORDER — HEPARIN SODIUM (PORCINE) 1000 UNIT/ML IJ SOLN
INTRAMUSCULAR | Status: DC | PRN
  Administered 2021-05-18: 5000 [IU] via INTRAVENOUS

## 2021-05-18 MED ORDER — LIDOCAINE HCL (PF) 1 % IJ SOLN
INTRAMUSCULAR | Status: AC
  Filled 2021-05-18: qty 30

## 2021-05-18 MED ORDER — FENTANYL CITRATE (PF) 100 MCG/2ML IJ SOLN
INTRAMUSCULAR | Status: AC
  Filled 2021-05-18: qty 2

## 2021-05-18 MED ORDER — HYDRALAZINE HCL 20 MG/ML IJ SOLN
10.0000 mg | INTRAMUSCULAR | Status: DC | PRN
Start: 2021-05-18 — End: 2021-05-18

## 2021-05-18 MED ORDER — ONDANSETRON HCL 4 MG/2ML IJ SOLN: 4.0000 mg | Freq: Four times a day (QID) | INTRAMUSCULAR | Status: DC | PRN

## 2021-05-18 MED ORDER — MAGNESIUM SULFATE 2 GM/50ML IV SOLN
2.0000 g | Freq: Once | INTRAVENOUS | Status: AC
  Administered 2021-05-18: 2 g via INTRAVENOUS

## 2021-05-18 MED ORDER — HEPARIN SODIUM (PORCINE) 1000 UNIT/ML IJ SOLN
INTRAMUSCULAR | Status: AC
  Filled 2021-05-18: qty 1

## 2021-05-18 MED ORDER — MIDAZOLAM HCL 2 MG/2ML IJ SOLN
INTRAMUSCULAR | Status: AC
  Filled 2021-05-18: qty 2

## 2021-05-18 MED ORDER — LABETALOL HCL 5 MG/ML IV SOLN: 10.0000 mg | INTRAVENOUS | Status: DC | PRN

## 2021-05-18 MED ORDER — MIDAZOLAM HCL 2 MG/2ML IJ SOLN
INTRAMUSCULAR | Status: DC | PRN
  Administered 2021-05-18: 1 mg via INTRAVENOUS

## 2021-05-18 MED ORDER — HEPARIN (PORCINE) IN NACL 1000-0.9 UT/500ML-% IV SOLN
INTRAVENOUS | Status: DC | PRN
  Administered 2021-05-18 (×2): 500 mL

## 2021-05-18 MED ORDER — MORPHINE SULFATE (PF) 2 MG/ML IV SOLN: 2.0000 mg | INTRAVENOUS | Status: DC | PRN

## 2021-05-18 MED ORDER — SODIUM CHLORIDE 0.9% FLUSH
3.0000 mL | Freq: Two times a day (BID) | INTRAVENOUS | Status: DC
  Administered 2021-05-19 – 2021-05-21 (×5): 3 mL via INTRAVENOUS

## 2021-05-18 MED ORDER — VERAPAMIL HCL 2.5 MG/ML IV SOLN
INTRAVENOUS | Status: AC
  Filled 2021-05-18: qty 2

## 2021-05-18 MED ORDER — SODIUM CHLORIDE 0.9% FLUSH: 3.0000 mL | INTRAVENOUS | Status: DC | PRN

## 2021-05-18 SURGICAL SUPPLY — 13 items
CATH INFINITI 5 FR JL3.5 (CATHETERS) ×2 IMPLANT
CATH OPTITORQUE TIG 4.0 5F (CATHETERS) ×2 IMPLANT
DEVICE RAD COMP TR BAND LRG (VASCULAR PRODUCTS) ×2 IMPLANT
GLIDESHEATH SLEND A-KIT 6F 22G (SHEATH) ×2 IMPLANT
GUIDEWIRE INQWIRE 1.5J.035X260 (WIRE) ×1 IMPLANT
INQWIRE 1.5J .035X260CM (WIRE) ×2
KIT HEART LEFT (KITS) ×2 IMPLANT
PACK CARDIAC CATHETERIZATION (CUSTOM PROCEDURE TRAY) ×2 IMPLANT
SHEATH PROBE COVER 6X72 (BAG) ×2 IMPLANT
SYR MEDRAD MARK 7 150ML (SYRINGE) ×2 IMPLANT
TRANSDUCER W/STOPCOCK (MISCELLANEOUS) ×2 IMPLANT
TUBING CIL FLEX 10 FLL-RA (TUBING) ×2 IMPLANT
WIRE HI TORQ VERSACORE-J 145CM (WIRE) ×2 IMPLANT

## 2021-05-18 NOTE — Interval H&P Note (Signed)
Cath Lab Visit (complete for each Cath Lab visit)  Clinical Evaluation Leading to the Procedure:   ACS: Yes.    Non-ACS:    Anginal Classification: CCS II  Anti-ischemic medical therapy: Minimal Therapy (1 class of medications)  Non-Invasive Test Results: No non-invasive testing performed  Prior CABG: No previous CABG      History and Physical Interval Note:  05/18/2021 1:34 PM  Andres Richardson  has presented today for surgery, with the diagnosis of chest pain.  The various methods of treatment have been discussed with the patient and family. After consideration of risks, benefits and other options for treatment, the patient has consented to  Procedure(s): LEFT HEART CATH AND CORONARY ANGIOGRAPHY (N/A) as a surgical intervention.  The patient's history has been reviewed, patient examined, no change in status, stable for surgery.  I have reviewed the patient's chart and labs.  Questions were answered to the patient's satisfaction.     Nanetta Batty

## 2021-05-18 NOTE — Progress Notes (Signed)
Mobility Specialist Progress Note   05/18/21 1112  Mobility  Activity Refused mobility   Pt preparing for heart cath. Will try again later.  Frederico Hamman Mobility Specialist Phone Number [phone]

## 2021-05-18 NOTE — Progress Notes (Signed)
Progress Note  Patient Name: Andres Richardson Date of Encounter: 05/18/2021  Primary Cardiologist: Parke Poisson, MD  Subjective   Denies any chest pain or dyspnea  Inpatient Medications    Scheduled Meds:  aspirin EC  81 mg Oral Daily   atorvastatin  40 mg Oral Daily   insulin aspart  0-15 Units Subcutaneous TID WC   insulin aspart  0-5 Units Subcutaneous QHS   insulin aspart  4 Units Subcutaneous TID WC   insulin glargine-yfgn  30 Units Subcutaneous Daily   metoprolol succinate  12.5 mg Oral Daily   sodium chloride flush  3 mL Intravenous Q12H   sodium chloride flush  3 mL Intravenous Q12H   Continuous Infusions:  sodium chloride     sodium chloride     sodium chloride 1 mL/kg/hr (05/18/21 0546)   heparin 2,400 Units/hr (05/17/21 2038)   PRN Meds: sodium chloride, sodium chloride, acetaminophen **OR** acetaminophen, metoprolol tartrate, ondansetron **OR** ondansetron (ZOFRAN) IV, sodium chloride flush, sodium chloride flush   Vital Signs    Vitals:   05/17/21 1930 05/18/21 0440 05/18/21 0444 05/18/21 0800  BP: 138/66  (!) 149/70 136/76  Pulse: 94  92 86  Resp: 18  16   Temp: 97.9 F (36.6 C)  98 F (36.7 C)   TempSrc: Oral  Oral   SpO2: 100%  99%   Weight:  101.7 kg    Height:        Intake/Output Summary (Last 24 hours) at 05/18/2021 0907 Last data filed at 05/18/2021 0815 Gross per 24 hour  Intake 720 ml  Output 2970 ml  Net -2250 ml    Filed Weights   05/16/21 0144 05/17/21 0000 05/18/21 0440  Weight: 100.5 kg 100.7 kg 101.7 kg    Telemetry    Sinus rhythm with rate 50s.  Personally reviewed.  ECG    No new ECG  Physical Exam   GEN: No acute distress.   Neck: No JVD. Cardiac: RRR without gallop or rub.  Respiratory: Nonlabored.  Clear to auscultation. GI: Soft, nontender, bowel sounds present. MS: Trace pitting edema. Neuro:  Nonfocal. Psych: Alert and oriented x 3. Normal affect.  Labs    Chemistry Recent Labs  Lab  05/15/21 0430 05/15/21 0541 05/16/21 0319 05/17/21 0325 05/18/21 0244  NA 142   < > 136 136 136  K 4.8   < > 3.6 3.9 3.8  CL 110  --  104 104 103  CO2 20*  --  24 22 25   GLUCOSE 237*  --  326* 309* 249*  BUN 16  --  15 17 17   CREATININE 1.16  --  1.15 1.03 1.22  CALCIUM 9.0  --  8.3* 8.7* 8.6*  PROT 7.7  --   --   --   --   ALBUMIN 3.7  --   --  2.9* 2.9*  AST 36  --   --   --   --   ALT 32  --   --   --   --   ALKPHOS 72  --   --   --   --   BILITOT 0.2*  --   --   --   --   GFRNONAA >60  --  >60 >60 >60  ANIONGAP 12  --  8 10 8    < > = values in this interval not displayed.      Hematology Recent Labs  Lab 05/16/21 0319 05/17/21 0325 05/18/21 0244  WBC 5.9 5.5 6.4  RBC 3.69* 3.62*  3.55* 3.64*  HGB 9.6* 9.4* 9.5*  HCT 31.8* 30.7* 31.5*  MCV 86.2 84.8 86.5  MCH 26.0 26.0 26.1  MCHC 30.2 30.6 30.2  RDW 16.7* 16.7* 17.2*  PLT 251 247 253     Cardiac Enzymes Recent Labs  Lab 05/10/21 1025 05/10/21 1635 05/11/21 0804 05/15/21 0430 05/15/21 0600  TROPONINIHS 893* 1,326* 1,359* 176* 223*     BNP Recent Labs  Lab 05/15/21 0430 05/17/21 0325 05/18/21 0244  BNP 405.1* 165.3* 108.0*      Radiology    DG Chest Port 1 View  Result Date: 05/16/2021 CLINICAL DATA:  Shortness of breath. EXAM: PORTABLE CHEST 1 VIEW COMPARISON:  Chest x-ray 05/15/2021. FINDINGS: The heart is enlarged, unchanged. There is been interval resolution of bilateral interstitial prominence. Minimal interstitial opacities persist in the lung bases. There is no new focal lung consolidation, pleural effusion or pneumothorax. No acute fractures are seen. IMPRESSION: 1. Markedly improved bilateral interstitial opacities likely related to resolving edema. No new focal lung infiltrate. Electronically Signed   By: Darliss Cheney M.D.   On: 05/16/2021 16:58    Cardiac Studies   Echocardiogram 05/11/2021:  1. Left ventricular ejection fraction, by estimation, is 50 to 55%. The  left ventricle  has low normal function. The left ventricle demonstrates  regional wall motion abnormalities (see scoring diagram/findings for  description). There is moderate left  ventricular hypertrophy. Left ventricular diastolic parameters are  consistent with Grade I diastolic dysfunction (impaired relaxation). There  is moderate hypokinesis of the left ventricular, basal inferior wall and  inferolateral wall.   2. Right ventricular systolic function is normal. The right ventricular  size is normal. There is normal pulmonary artery systolic pressure. The  estimated right ventricular systolic pressure is 31.4 mmHg.   3. The mitral valve is grossly normal. Trivial mitral valve  regurgitation.   4. The aortic valve was not well visualized. Aortic valve regurgitation  is trivial.   5. The inferior vena cava is dilated in size with >50% respiratory  variability, suggesting right atrial pressure of 8 mmHg.  Assessment & Plan    NSTEMI during recent hospitalization, peak troponin I 1359 on September 26.  Cardiac catheterization was recommended but he declined at that time.  Presented back to the ED on 9/30 with chest pain and mild troponin elevation.  Now willing to undergo cath.  Echo 9/26 showed EF 50 to 55%, normal RV function, no significant valvular disease. -Plan LHC today.  Risks and benefits of cardiac catheterization have been discussed with the patient.  These include bleeding, infection, kidney damage, stroke, heart attack, death.  The patient understands these risks and is willing to proceed. -Continue aspirin 81 mg daily, atorvastatin 40 mg daily, heparin drip, Toprol-XL 12.5 mg daily  Type 2 diabetes mellitus.  Hemoglobin A1c 6.8%.  He is on Glucophage XR, Glucotrol XL, and insulin as an outpatient.  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  Tobacco abuse in remission.  PAD with known aortoiliac disease.  Mixed hyperlipidemia, on Lipitor.  LDL  44.  Recent hospitalization with aspiration pneumonia versus chemical pneumonitis, has completed course of antibiotics.  Afebrile.  No cough.  Chronic anemia normal MCV.  Hemoglobin is stable at 9.5 today.  Signed, Little Ishikawa, MD  05/18/2021, 9:07 AM

## 2021-05-18 NOTE — Progress Notes (Signed)
ANTICOAGULATION CONSULT NOTE - Follow Up Consult  Pharmacy Consult for Heparin Indication: chest pain/ACS  Allergies  Allergen Reactions   Adhesive [Tape] Rash    Patient Measurements: Height: 5\' 11"  (180.3 cm) Weight: 101.7 kg (224 lb 4.8 oz) IBW/kg (Calculated) : 75.3 Heparin Dosing Weight: 96 kg  Vital Signs: Temp: 98 F (36.7 C) (10/03 0444) Temp Source: Oral (10/03 0444) BP: 136/76 (10/03 0800) Pulse Rate: 86 (10/03 0800)  Labs: Recent Labs    05/16/21 0319 05/16/21 1053 05/17/21 0325 05/17/21 1038 05/17/21 1940 05/18/21 0244  HGB 9.6*  --  9.4*  --   --  9.5*  HCT 31.8*  --  30.7*  --   --  31.5*  PLT 251  --  247  --   --  253  HEPARINUNFRC <0.10*   < > 0.31 0.29* 0.40 0.40  CREATININE 1.15  --  1.03  --   --  1.22   < > = values in this interval not displayed.    Estimated Creatinine Clearance: 83.1 mL/min (by C-G formula based on SCr of 1.22 mg/dL).  Assessment: 55 y.o. M presented 05/15/21 with shortness of breath. Troponin up to 223. Just d/c on 9/29 from [facility] after being treated for PNA. Initial plan was for outpatient cardiac cath. Pharmacy consulted for IV heparin for ACS. No AC PTA.    Heparin level remains therapeutic (0.40) on 2400 units/hr.  CBC stable. Plan cardiac cath today.  Goal of Therapy:  Heparin level 0.3-0.7 units/ml Monitor platelets by anticoagulation protocol: Yes   Plan:  Continue heparin drip at 2400 units/hr Daily heparin level and CBC while on heparin. Follow up post-cath.  [facility], RPh 05/18/2021,11:05 AM

## 2021-05-18 NOTE — Progress Notes (Addendum)
PROGRESS NOTE  Andres Richardson MWN:[id number] DOB: 10-26-1965   PCP: [name], MD  Patient is from: Home.  Independently ambulates at baseline.  DOA: 05/15/2021 LOS: 3  Chief complaints:  Chief Complaint  Patient presents with   Shortness of Breath     Brief Narrative / Interim history: 55 year old M with PMH of IDDM-2, former 80-pack-year history of smoking (quit in 02/2021), Fournier's gangrene s/p debridement and loop sigmoid colostomy in 02/2021, and most recent hospitalization from 9/25-9/29 for acute hypoxemic and hypercapnic RF and sepsis in the setting of aspiration pneumonia and chemical pneumonitis, non-STEMI and DKA returning with acute chest pain and dyspnea about 4 AM while avoiding that prompted him to call EMS.  Reportedly, patient was anxious about her cauterization previous hospitalization.   In ED, he was slightly hypertensive.  He was tachypneic to 36 with saturation of 90%.  BNP 405.  Trop 176>> 223.  He was placed on BiPAP, IV Lasix, nitro drip and heparin, and admitted for chest pain with elevated troponin/possible non-STEMI and CHF exacerbation.  Underwent LHC this afternoon.  Results pending.   Subjective: Seen and examined earlier this morning before he went down for LHC.  No major events overnight of this morning.  No complaints other than the swelling in his legs and delayed LHC.  Patient's brother at bedside.  Objective: Vitals:   05/18/21 1412 05/18/21 1417 05/18/21 1421 05/18/21 1426  BP: (!) 149/75 (!) 149/70 (!) 148/74 (!) 154/76  Pulse: 89 93 96 96  Resp: (!) Temp:      TempSrc:      SpO2: 99% 99% 100% (!) 0%  Weight:      Height:        Intake/Output Summary (Last 24 hours) at 05/18/2021 1529 Last data filed at 05/18/2021 1134 Gross per 24 hour  Intake 480 ml  Output 2720 ml  Net -2240 ml   Filed Weights   05/16/21 0144 05/17/21 0000 05/18/21 0440  Weight: 100.5 kg 100.7 kg 101.7 kg    Examination:  GENERAL: No apparent  distress.  Nontoxic. HEENT: MMM.  Vision and hearing grossly intact.  NECK: Supple.  No apparent JVD.  RESP: 99% on RA.  No IWOB.  Fair aeration bilaterally. CVS:  RRR. Heart sounds normal.  ABD/GI/GU: BS+. Abd soft, NTND.  MSK/EXT:  Moves extremities. No apparent deformity.  1+ edema in BLE. SKIN: no apparent skin lesion or wound NEURO: Awake and alert. Oriented appropriately.  No apparent focal neuro deficit. PSYCH: Calm. Normal affect.   Procedures:  None  Microbiology summarized: COVID-19 and influenza PCR nonreactive.  Assessment & Plan: Acute respiratory failure with hypoxia: Likely due to CHF or undiagnosed COPD and OSA. Desaturated to 88% while asleep the day of admission.  Carries 80-pack-year history of smoking.  Now upper 90s to 100% on RA. -Needs outpatient PFTs and sleep study -Manage CHF as below -CPAP/BiPAP at night  Elevated troponin/recent NSTEMI: Presents with acute chest pain and dyspnea.  Troponin [id number] (peaked at 1326 prior to admission when he declined heart cath).  EKG with sinus tachycardia at 140s with what looks like PACs.  TTE on 9/26 with LVEF of 50 to 55%, R WMA, G1 DD, RVSP of 31. Chest pain, dyspnea and tachycardia has resolved with diuretics.  Chest x-ray improved.  LHC result pending. -LHC today -Continue IV heparin, metoprolol, ASA  -Manage CHF as below   Acute diastolic CHF: TTE as above.  BNP 405 (slightly higher  than prior admission).  Significant improvement in his symptoms and CXR after a dose of IV Lasix.  About 3 L UOP/24 hours without diuretics.  Net -6.3 L. Cr stable.  Still with significant BLE edema. -Defer diuretics to cardiology-on hold pending LHC. -Monitor fluid status, renal functions and electrolytes. -Sodium and fluid restriction -Leg elevation.  Controlled IDDM-2 with hyperglycemia and other vascular complication: A1c 6.8% on 9/28. Recent Labs  Lab 05/17/21 1112 05/17/21 1617 05/17/21 2104 05/18/21 0552 05/18/21 1132   GLUCAP 289* 276* 231* 243* 209*  -Continue Lantus 30 units daily -Continue SSI-moderate -NovoLog 4 units 3 times daily with meals -Continue statin  Normocytic anemia: H&H seems to be at baseline.  Denies melena or hematochezia.  Anemia panel normal. Recent Labs    05/10/21 0808 05/10/21 0815 05/11/21 0250 05/13/21 0407 05/14/21 0343 05/15/21 0430 05/15/21 0541 05/16/21 0319 05/17/21 0325 05/18/21 0244  HGB 10.9* 12.6* 9.2* 8.8* 8.8* 11.1* 8.8* 9.6* 9.4* 9.5*  -Continue monitoring  Hyperlipidemia -Continue statin as above   Recent hospitalization for aspiration pneumonia vs. Chemical pneumonitis -Completed course of Augmentin   Recent Fournier's gangrene in July 2022--s/p surgical debridement and ostomy.  -Plan for ostomy reversal in November.  -Appreciate help by WOCN.  Hypomagnesemia: Mg 1.7. -IV magnesium sulfate 2 g x 1 -Recheck in the morning  Class I obesity Body mass index is 31.28 kg/m.         DVT prophylaxis:    On IV heparin for non-STEMI.  Code Status: Full code Family Communication: Updated patient's brother at bedside. Level of care: Telemetry Cardiac.  Status is: Inpatient  Remains inpatient appropriate because:Ongoing diagnostic testing needed not appropriate for outpatient work up, IV treatments appropriate due to intensity of illness or inability to take PO, and Inpatient level of care appropriate due to severity of illness  Dispo: The patient is from: Home              Anticipated d/c is to: Home              Patient currently is not medically stable to d/c.   Difficult to place patient No       Consultants:  Cardiology   Sch Meds:  Scheduled Meds:  aspirin  81 mg Oral Daily   aspirin EC  81 mg Oral Daily   atorvastatin  40 mg Oral Daily   insulin aspart  0-15 Units Subcutaneous TID WC   insulin aspart  0-5 Units Subcutaneous QHS   insulin aspart  4 Units Subcutaneous TID WC   insulin glargine-yfgn  30 Units Subcutaneous  Daily   metoprolol succinate  12.5 mg Oral Daily   sodium chloride flush  3 mL Intravenous Q12H   sodium chloride flush  3 mL Intravenous Q12H   sodium chloride flush  3 mL Intravenous Q12H   Continuous Infusions:  sodium chloride     sodium chloride     sodium chloride     heparin 2,400 Units/hr (05/17/21 2038)   PRN Meds:.sodium chloride, sodium chloride, acetaminophen **OR** acetaminophen, acetaminophen, hydrALAZINE, labetalol, metoprolol tartrate, morphine injection, ondansetron **OR** ondansetron (ZOFRAN) IV, ondansetron (ZOFRAN) IV, sodium chloride flush, sodium chloride flush  Antimicrobials: Anti-infectives (From admission, onward)    Start     Dose/Rate Route Frequency Ordered Stop   05/15/21 2200  amoxicillin-clavulanate (AUGMENTIN) 875-125 MG per tablet 1 tablet        1 tablet Oral Every 12 hours 05/15/21 1517 05/16/21 2046  I have personally reviewed the following labs and images: CBC: Recent Labs  Lab 05/14/21 0343 05/15/21 0430 05/15/21 0541 05/16/21 0319 05/17/21 0325 05/18/21 0244  WBC 4.7 12.0*  --  5.9 5.5 6.4  NEUTROABS  --  5.6  --   --   --   --   HGB 8.8* 11.1* 8.8* 9.6* 9.4* 9.5*  HCT 28.9* 37.4* 26.0* 31.8* 30.7* 31.5*  MCV 85.0 87.6  --  86.2 84.8 86.5  PLT 232 456*  --  251 247 253   BMP &GFR Recent Labs  Lab 05/14/21 0343 05/15/21 0430 05/15/21 0541 05/16/21 0319 05/17/21 0325 05/18/21 0244  NA 140 142 143 136 136 136  K 3.9 4.8 3.9 3.6 3.9 3.8  CL 109 110  --  104 104 103  CO2 22 20*  --  24 22 25   GLUCOSE 365* 237*  --  326* 309* 249*  BUN 16 16  --  15 17 17   CREATININE 1.01 1.16  --  1.15 1.03 1.22  CALCIUM 8.3* 9.0  --  8.3* 8.7* 8.6*  MG  --   --   --   --  1.6* 1.7  PHOS  --   --   --   --  3.3 3.8   Estimated Creatinine Clearance: 83.1 mL/min (by C-G formula based on SCr of 1.22 mg/dL). Liver & Pancreas: Recent Labs  Lab 05/15/21 0430 05/17/21 0325 05/18/21 0244  AST 36  --   --   ALT 32  --   --    ALKPHOS 72  --   --   BILITOT 0.2*  --   --   PROT 7.7  --   --   ALBUMIN 3.7 2.9* 2.9*   No results for input(s): LIPASE, AMYLASE in the last 168 hours. No results for input(s): AMMONIA in the last 168 hours. Diabetic: No results for input(s): HGBA1C in the last 72 hours. Recent Labs  Lab 05/17/21 1112 05/17/21 1617 05/17/21 2104 05/18/21 0552 05/18/21 1132  GLUCAP 289* 276* 231* 243* 209*   Cardiac Enzymes: No results for input(s): CKTOTAL, CKMB, CKMBINDEX, TROPONINI in the last 168 hours. No results for input(s): PROBNP in the last 8760 hours. Coagulation Profile: No results for input(s): INR, PROTIME in the last 168 hours.  Thyroid Function Tests: No results for input(s): TSH, T4TOTAL, FREET4, T3FREE, THYROIDAB in the last 72 hours. Lipid Profile: No results for input(s): CHOL, HDL, LDLCALC, TRIG, CHOLHDL, LDLDIRECT in the last 72 hours. Anemia Panel: Recent Labs    05/17/21 0325  VITAMINB12 289  FOLATE 8.5  FERRITIN 148  TIBC 329  IRON 65  RETICCTPCT 3.4*   Urine analysis:    Component Value Date/Time   COLORURINE COLORLESS (A) 05/10/2021 0808   APPEARANCEUR CLEAR 05/10/2021 0808   LABSPEC 1.010 05/10/2021 0808   PHURINE 6.0 05/10/2021 0808   GLUCOSEU 500 (A) 05/10/2021 0808   HGBUR NEGATIVE 05/10/2021 0808   BILIRUBINUR NEGATIVE 05/10/2021 0808   KETONESUR NEGATIVE 05/10/2021 0808   PROTEINUR 100 (A) 05/10/2021 0808   NITRITE NEGATIVE 05/10/2021 0808   LEUKOCYTESUR NEGATIVE 05/10/2021 0808   Sepsis Labs: Invalid input(s): PROCALCITONIN, LACTICIDVEN  Microbiology: Recent Results (from the past 240 hour(s))  Blood Culture (routine x 2)     Status: None   Collection Time: 05/10/21  8:00 AM   Specimen: BLOOD  Result Value Ref Range Status   Specimen Description   Final    BLOOD LEFT UPPER ARM Performed at [facility], [address], [place], [place] [address]    Special Requests   Final    BOTTLES DRAWN AEROBIC AND  ANAEROBIC Blood Culture adequate volume Performed at [facility], [address] [place], [place] [address]    Culture   Final    NO GROWTH 5 DAYS Performed at [facility], [address], [place], [place] [address]    Report Status 05/15/2021 FINAL  Final  Blood Culture (routine x 2)     Status: None   Collection Time: 05/10/21  8:07 AM   Specimen: BLOOD RIGHT HAND  Result Value Ref Range Status   Specimen Description   Final    BLOOD RIGHT HAND Performed at [facility], [address] [place], [place] [address]    Special Requests   Final    BOTTLES DRAWN AEROBIC AND ANAEROBIC Blood Culture adequate volume Performed at [facility], [address] [place], [place] [address]    Culture   Final    NO GROWTH 5 DAYS Performed at [facility], [address], [place], [place] [address]    Report Status 05/15/2021 FINAL  Final  Resp Panel by RT-PCR (Flu A&B, Covid) Nasopharyngeal Swab     Status: None   Collection Time: 05/10/21 10:25 AM   Specimen: Nasopharyngeal Swab; Nasopharyngeal(NP) swabs in vial transport medium  Result Value Ref Range Status   SARS Coronavirus 2 by RT PCR NEGATIVE NEGATIVE Final    Comment: (NOTE) SARS-CoV-2 target nucleic acids are NOT DETECTED.  The SARS-CoV-2 RNA is generally detectable in upper respiratory specimens during the acute phase of infection. The lowest concentration of SARS-CoV-2 viral copies this assay can detect is 138 copies/mL. A negative result does not preclude SARS-Cov-2 infection and should not be used as the sole basis for treatment or other patient management decisions. A negative result may occur with  improper specimen collection/handling, submission of specimen other than nasopharyngeal swab, presence of viral mutation(s) within the areas targeted by this assay, and inadequate number of viral copies(<138 copies/mL). A negative result must be combined  with clinical observations, patient history, and epidemiological information. The expected result is Negative.  Fact Sheet for Patients:  BloggerCourse.com  Fact Sheet for Healthcare Providers:  SeriousBroker.it  This test is no t yet approved or cleared by the [place] FDA and  has been authorized for detection and/or diagnosis of SARS-CoV-2 by FDA under an Emergency Use Authorization (EUA). This EUA will remain  in effect (meaning this test can be used) for the duration of the COVID-19 declaration under Section 564(b)(1) of the Act, 21 U.S.C.section 360bbb-3(b)(1), unless the authorization is terminated  or revoked sooner.       Influenza A by PCR NEGATIVE NEGATIVE Final   Influenza B by PCR NEGATIVE NEGATIVE Final    Comment: (NOTE) The Xpert Xpress SARS-CoV-2/FLU/RSV plus assay is intended as an aid in the diagnosis of influenza from Nasopharyngeal swab specimens and should not be used as a sole basis for treatment. Nasal washings and aspirates are unacceptable for Xpert Xpress SARS-CoV-2/FLU/RSV testing.  Fact Sheet for Patients: BloggerCourse.com  Fact Sheet for Healthcare Providers: SeriousBroker.it  This test is not yet approved or cleared by the [place] FDA and has been authorized for detection and/or diagnosis of SARS-CoV-2 by FDA under an Emergency Use Authorization (EUA). This EUA will remain in effect (meaning this test can be used) for the duration of the COVID-19 declaration under Section 564(b)(1) of the Act,  21 U.S.C. section 360bbb-3(b)(1), unless the authorization is terminated or revoked.  Performed at [facility], [address] [place], [place] [address]   MRSA Next Gen by PCR, Nasal     Status: None   Collection Time: 05/10/21 11:51 AM   Specimen: Nasal Mucosa; Nasal Swab  Result Value Ref Range Status   MRSA by PCR  Next Gen NOT DETECTED NOT DETECTED Final    Comment: (NOTE) The GeneXpert MRSA Assay (FDA approved for NASAL specimens only), is one component of a comprehensive MRSA colonization surveillance program. It is not intended to diagnose MRSA infection nor to guide or monitor treatment for MRSA infections. Test performance is not FDA approved in patients less than 55 years old. Performed at [facility], [address], [place], [place] [address]   Resp Panel by RT-PCR (Flu A&B, Covid) Nasopharyngeal Swab     Status: None   Collection Time: 05/15/21  5:45 AM   Specimen: Nasopharyngeal Swab; Nasopharyngeal(NP) swabs in vial transport medium  Result Value Ref Range Status   SARS Coronavirus 2 by RT PCR NEGATIVE NEGATIVE Final    Comment: (NOTE) SARS-CoV-2 target nucleic acids are NOT DETECTED.  The SARS-CoV-2 RNA is generally detectable in upper respiratory specimens during the acute phase of infection. The lowest concentration of SARS-CoV-2 viral copies this assay can detect is 138 copies/mL. A negative result does not preclude SARS-Cov-2 infection and should not be used as the sole basis for treatment or other patient management decisions. A negative result may occur with  improper specimen collection/handling, submission of specimen other than nasopharyngeal swab, presence of viral mutation(s) within the areas targeted by this assay, and inadequate number of viral copies(<138 copies/mL). A negative result must be combined with clinical observations, patient history, and epidemiological information. The expected result is Negative.  Fact Sheet for Patients:  BloggerCourse.com  Fact Sheet for Healthcare Providers:  SeriousBroker.it  This test is no t yet approved or cleared by the [place] FDA and  has been authorized for detection and/or diagnosis of SARS-CoV-2 by FDA under an Emergency Use Authorization (EUA).  This EUA will remain  in effect (meaning this test can be used) for the duration of the COVID-19 declaration under Section 564(b)(1) of the Act, 21 U.S.C.section 360bbb-3(b)(1), unless the authorization is terminated  or revoked sooner.       Influenza A by PCR NEGATIVE NEGATIVE Final   Influenza B by PCR NEGATIVE NEGATIVE Final    Comment: (NOTE) The Xpert Xpress SARS-CoV-2/FLU/RSV plus assay is intended as an aid in the diagnosis of influenza from Nasopharyngeal swab specimens and should not be used as a sole basis for treatment. Nasal washings and aspirates are unacceptable for Xpert Xpress SARS-CoV-2/FLU/RSV testing.  Fact Sheet for Patients: BloggerCourse.com  Fact Sheet for Healthcare Providers: SeriousBroker.it  This test is not yet approved or cleared by the [place] FDA and has been authorized for detection and/or diagnosis of SARS-CoV-2 by FDA under an Emergency Use Authorization (EUA). This EUA will remain in effect (meaning this test can be used) for the duration of the COVID-19 declaration under Section 564(b)(1) of the Act, 21 U.S.C. section 360bbb-3(b)(1), unless the authorization is terminated or revoked.  Performed at [facility], [address], [place], [place] [address]     Radiology Studies: CARDIAC CATHETERIZATION  Result Date: 05/18/2021 Images from the original result were not included.   Prox RCA lesion is 100% stenosed.   Prox Cx to Mid Cx lesion  is 60% stenosed.   LPAV lesion is 60% stenosed.   Ost LAD to Prox LAD lesion is 40% stenosed. Andres Richardson is a 55 y.o. male  [id number] LOCATION:  FACILITY: [facility] PHYSICIAN: Nanetta Batty, M.D. [birth year] DATE OF PROCEDURE:  05/18/2021 DATE OF DISCHARGE: CARDIAC CATHETERIZATION History obtained from chart review.  55 year old Caucasian male with history of tobacco abuse, hypertension, hyperlipidemia and peripheral arterial disease.  He  has a known occluded left common iliac artery.  He was recently mated with pneumonia and had positive enzymes.  2D echo was normal.  He was discharged home after refusing diagnostic coronary angiography and awakened with shortness of breath and chest pain and was readmitted.  He presents now for diagnostic coronary angiography.   Mr. 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  He does have moderate left PDA disease.  His LVEDP was 21.  There were no "culprit lesions" requiring intervention.  Medical therapy will be recommended.  The sheath was removed and a TR band was placed on the right wrist to achieve patent hemostasis.  The patient left lab stable condition. Nanetta Batty. MD, [facility] 05/18/2021 2:36 PM       Muriah Harsha T. Meloney [facility]  If 7PM-7AM, please contact night-coverage [email or web address] 05/18/2021, 3:29 PM

## 2021-05-18 NOTE — Consult Note (Signed)
WOC Nurse Consult Note: Patient receiving care in [facility] 3E03 Reason for Consult: sacral wound Wound type: debridement of Fournier's gangrene and diverting ileostomy at the end of July. Healing wound on the left buttock next to the anus. Pink granulation tissue with some sanguinous drainage on dressing.  Pressure Injury POA: Yes/No/NA Dressing procedure/placement/frequency: Apply a moistened saline gauze over the wound and cover with dry gauze and ABD pad if needed for drainage. Change daily.   Monitor the wound area(s) for worsening of condition such as: Signs/symptoms of infection, increase in size, development of or worsening of odor, development of pain, or increased pain at the affected locations.   Notify the medical team if any of these develop.  Thank you for the consult. WOC nurse will not follow at this time.   Please re-consult the WOC team if needed.  Renaldo Reel Katrinka Blazing, MSN, RN, CMSRN, Angus Seller, [facility] Wound Treatment Associate Pager [phone]

## 2021-05-18 NOTE — Progress Notes (Signed)
Pt ambulated to the nurses station and stated that if he "is not picked up for heart cath by one thirty, he will eat" pt's friend by bedside is talking to patient about bringing him food from cafeteria.    This nurse spoke with cath lab to figure out the approximate time.   Nurse offered education to patient and emotional support. However pt stated that he will not wait longer than 1:30pm.

## 2021-05-18 NOTE — H&P (View-Only) (Signed)
Progress Note  Patient Name: Andres Richardson Date of Encounter: 05/18/2021  Primary Cardiologist: Parke Poisson, MD  Subjective   Denies any chest pain or dyspnea  Inpatient Medications    Scheduled Meds:  aspirin EC  81 mg Oral Daily   atorvastatin  40 mg Oral Daily   insulin aspart  0-15 Units Subcutaneous TID WC   insulin aspart  0-5 Units Subcutaneous QHS   insulin aspart  4 Units Subcutaneous TID WC   insulin glargine-yfgn  30 Units Subcutaneous Daily   metoprolol succinate  12.5 mg Oral Daily   sodium chloride flush  3 mL Intravenous Q12H   sodium chloride flush  3 mL Intravenous Q12H   Continuous Infusions:  sodium chloride     sodium chloride     sodium chloride 1 mL/kg/hr (05/18/21 0546)   heparin 2,400 Units/hr (05/17/21 2038)   PRN Meds: sodium chloride, sodium chloride, acetaminophen **OR** acetaminophen, metoprolol tartrate, ondansetron **OR** ondansetron (ZOFRAN) IV, sodium chloride flush, sodium chloride flush   Vital Signs    Vitals:   05/17/21 1930 05/18/21 0440 05/18/21 0444 05/18/21 0800  BP: 138/66  (!) 149/70 136/76  Pulse: 94  92 86  Resp: 18  16   Temp: 97.9 F (36.6 C)  98 F (36.7 C)   TempSrc: Oral  Oral   SpO2: 100%  99%   Weight:  101.7 kg    Height:        Intake/Output Summary (Last 24 hours) at 05/18/2021 0907 Last data filed at 05/18/2021 0815 Gross per 24 hour  Intake 720 ml  Output 2970 ml  Net -2250 ml    Filed Weights   05/16/21 0144 05/17/21 0000 05/18/21 0440  Weight: 100.5 kg 100.7 kg 101.7 kg    Telemetry    Sinus rhythm with rate 50s.  Personally reviewed.  ECG    No new ECG  Physical Exam   GEN: No acute distress.   Neck: No JVD. Cardiac: RRR without gallop or rub.  Respiratory: Nonlabored.  Clear to auscultation. GI: Soft, nontender, bowel sounds present. MS: Trace pitting edema. Neuro:  Nonfocal. Psych: Alert and oriented x 3. Normal affect.  Labs    Chemistry Recent Labs  Lab  05/15/21 0430 05/15/21 0541 05/16/21 0319 05/17/21 0325 05/18/21 0244  NA 142   < > 136 136 136  K 4.8   < > 3.6 3.9 3.8  CL 110  --  104 104 103  CO2 20*  --  24 22 25   GLUCOSE 237*  --  326* 309* 249*  BUN 16  --  15 17 17   CREATININE 1.16  --  1.15 1.03 1.22  CALCIUM 9.0  --  8.3* 8.7* 8.6*  PROT 7.7  --   --   --   --   ALBUMIN 3.7  --   --  2.9* 2.9*  AST 36  --   --   --   --   ALT 32  --   --   --   --   ALKPHOS 72  --   --   --   --   BILITOT 0.2*  --   --   --   --   GFRNONAA >60  --  >60 >60 >60  ANIONGAP 12  --  8 10 8    < > = values in this interval not displayed.      Hematology Recent Labs  Lab 05/16/21 0319 05/17/21 0325 05/18/21 0244  WBC 5.9 5.5 6.4  RBC 3.69* 3.62*  3.55* 3.64*  HGB 9.6* 9.4* 9.5*  HCT 31.8* 30.7* 31.5*  MCV 86.2 84.8 86.5  MCH 26.0 26.0 26.1  MCHC 30.2 30.6 30.2  RDW 16.7* 16.7* 17.2*  PLT 251 247 253     Cardiac Enzymes Recent Labs  Lab 05/10/21 1025 05/10/21 1635 05/11/21 0804 05/15/21 0430 05/15/21 0600  TROPONINIHS 893* 1,326* 1,359* 176* 223*     BNP Recent Labs  Lab 05/15/21 0430 05/17/21 0325 05/18/21 0244  BNP 405.1* 165.3* 108.0*      Radiology    DG Chest Port 1 View  Result Date: 05/16/2021 CLINICAL DATA:  Shortness of breath. EXAM: PORTABLE CHEST 1 VIEW COMPARISON:  Chest x-ray 05/15/2021. FINDINGS: The heart is enlarged, unchanged. There is been interval resolution of bilateral interstitial prominence. Minimal interstitial opacities persist in the lung bases. There is no new focal lung consolidation, pleural effusion or pneumothorax. No acute fractures are seen. IMPRESSION: 1. Markedly improved bilateral interstitial opacities likely related to resolving edema. No new focal lung infiltrate. Electronically Signed   By: Darliss Cheney M.D.   On: 05/16/2021 16:58    Cardiac Studies   Echocardiogram 05/11/2021:  1. Left ventricular ejection fraction, by estimation, is 50 to 55%. The  left ventricle  has low normal function. The left ventricle demonstrates  regional wall motion abnormalities (see scoring diagram/findings for  description). There is moderate left  ventricular hypertrophy. Left ventricular diastolic parameters are  consistent with Grade I diastolic dysfunction (impaired relaxation). There  is moderate hypokinesis of the left ventricular, basal inferior wall and  inferolateral wall.   2. Right ventricular systolic function is normal. The right ventricular  size is normal. There is normal pulmonary artery systolic pressure. The  estimated right ventricular systolic pressure is 31.4 mmHg.   3. The mitral valve is grossly normal. Trivial mitral valve  regurgitation.   4. The aortic valve was not well visualized. Aortic valve regurgitation  is trivial.   5. The inferior vena cava is dilated in size with >50% respiratory  variability, suggesting right atrial pressure of 8 mmHg.  Assessment & Plan    NSTEMI during recent hospitalization, peak troponin I 1359 on September 26.  Cardiac catheterization was recommended but he declined at that time.  Presented back to the ED on 9/30 with chest pain and mild troponin elevation.  Now willing to undergo cath.  Echo 9/26 showed EF 50 to 55%, normal RV function, no significant valvular disease. -Plan LHC today.  Risks and benefits of cardiac catheterization have been discussed with the patient.  These include bleeding, infection, kidney damage, stroke, heart attack, death.  The patient understands these risks and is willing to proceed. -Continue aspirin 81 mg daily, atorvastatin 40 mg daily, heparin drip, Toprol-XL 12.5 mg daily  Type 2 diabetes mellitus.  Hemoglobin A1c 6.8%.  He is on Glucophage XR, Glucotrol XL, and insulin as an outpatient.  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  Tobacco abuse in remission.  PAD with known aortoiliac disease.  Mixed hyperlipidemia, on Lipitor.  LDL  44.  Recent hospitalization with aspiration pneumonia versus chemical pneumonitis, has completed course of antibiotics.  Afebrile.  No cough.  Chronic anemia normal MCV.  Hemoglobin is stable at 9.5 today.  Signed, Little Ishikawa, MD  05/18/2021, 9:07 AM

## 2021-05-18 NOTE — Progress Notes (Signed)
Pt refused cpap for the night.  

## 2021-05-18 NOTE — Progress Notes (Signed)
Inpatient Diabetes Program Recommendations  AACE/ADA: New Consensus Statement on Inpatient Glycemic Control (2015)  Target Ranges:  Prepandial:   less than 140 mg/dL      Peak postprandial:   less than 180 mg/dL (1-2 hours)      Critically ill patients:  140 - 180 mg/dL   Lab Results  Component Value Date   GLUCAP 243 (H) 05/18/2021   HGBA1C 6.8 (H) 05/13/2021    Review of Glycemic Control Results for Andres Richardson, Andres Richardson (MRN [id number]) as of 05/18/2021 10:51  Ref. Range 05/17/2021 11:12 05/17/2021 16:17 05/17/2021 21:04 05/18/2021 05:52  Glucose-Capillary Latest Ref Range: 70 - 99 mg/dL 295 (H) 284 (H) 132 (H) 243 (H)   Diabetes history: Type 2 DM Outpatient Diabetes medications: Novolin 70/30 15 units BID, Glipizide 5 mg QD, Metformin 750 mg BID. Current orders for Inpatient glycemic control: Semglee 30 units QD, Novolog 4 units TID, Novolog 0-15 units TID & HS  Inpatient Diabetes Program Recommendations:    Consider increasing Semglee to 36 units QD.   Thanks, Lujean Rave, MSN, RNC-OB Diabetes Coordinator [phone] (8a-5p)

## 2021-05-19 ENCOUNTER — Encounter (HOSPITAL_COMMUNITY): Payer: Self-pay | Admitting: Cardiovascular Disease

## 2021-05-19 DIAGNOSIS — I5033 Acute on chronic diastolic (congestive) heart failure: Secondary | ICD-10-CM

## 2021-05-19 LAB — RENAL FUNCTION PANEL
Albumin: 2.5 g/dL — ABNORMAL LOW (ref 3.5–5.0)
Anion gap: 8 (ref 5–15)
BUN: 16 mg/dL (ref 6–20)
CO2: 23 mmol/L (ref 22–32)
Calcium: 8.3 mg/dL — ABNORMAL LOW (ref 8.9–10.3)
Chloride: 105 mmol/L (ref 98–111)
Creatinine, Ser: 1.1 mg/dL (ref 0.61–1.24)
GFR, Estimated: >60 mL/min (ref >60)
Glucose, Bld: 258 mg/dL — ABNORMAL HIGH (ref 70–99)
Phosphorus: 3.9 mg/dL (ref 2.5–4.6)
Potassium: 3.7 mmol/L (ref 3.5–5.1)
Sodium: 136 mmol/L (ref 135–145)

## 2021-05-19 LAB — GLUCOSE, CAPILLARY
Glucose-Capillary: 234 mg/dL — ABNORMAL HIGH (ref 70–99)
Glucose-Capillary: 279 mg/dL — ABNORMAL HIGH (ref 70–99)
Glucose-Capillary: 275 mg/dL — ABNORMAL HIGH (ref 70–99)
Glucose-Capillary: 198 mg/dL — ABNORMAL HIGH (ref 70–99)

## 2021-05-19 LAB — BRAIN NATRIURETIC PEPTIDE: B Natriuretic Peptide: 217.1 pg/mL — ABNORMAL HIGH (ref 0.0–100.0)

## 2021-05-19 LAB — HEMOGLOBIN AND HEMATOCRIT, BLOOD
HCT: 34.2 % — ABNORMAL LOW (ref 39.0–52.0)
Hemoglobin: 10.3 g/dL — ABNORMAL LOW (ref 13.0–17.0)

## 2021-05-19 LAB — MAGNESIUM: Magnesium: 1.9 mg/dL (ref 1.7–2.4)

## 2021-05-19 MED ORDER — INSULIN ASPART 100 UNIT/ML IJ SOLN
0.0000 [IU] | Freq: Every day | INTRAMUSCULAR | Status: DC
  Administered 2021-05-20: 2 [IU] via SUBCUTANEOUS

## 2021-05-19 MED ORDER — ENOXAPARIN SODIUM 40 MG/0.4ML IJ SOSY
40.0000 mg | PREFILLED_SYRINGE | INTRAMUSCULAR | Status: DC
  Administered 2021-05-19 – 2021-05-21 (×3): 40 mg via SUBCUTANEOUS
  Filled 2021-05-19 (×3): qty 0.4

## 2021-05-19 MED ORDER — INSULIN ASPART 100 UNIT/ML IJ SOLN
0.0000 [IU] | Freq: Three times a day (TID) | INTRAMUSCULAR | Status: DC
  Administered 2021-05-19: 11 [IU] via SUBCUTANEOUS
  Administered 2021-05-20: 7 [IU] via SUBCUTANEOUS
  Administered 2021-05-20: 4 [IU] via SUBCUTANEOUS
  Administered 2021-05-21: 15 [IU] via SUBCUTANEOUS
  Administered 2021-05-21 (×2): 4 [IU] via SUBCUTANEOUS
  Administered 2021-05-22: 11 [IU] via SUBCUTANEOUS

## 2021-05-19 MED ORDER — INSULIN ASPART 100 UNIT/ML IJ SOLN
6.0000 [IU] | Freq: Three times a day (TID) | INTRAMUSCULAR | Status: DC
  Administered 2021-05-19 – 2021-05-22 (×7): 6 [IU] via SUBCUTANEOUS

## 2021-05-19 MED ORDER — POTASSIUM CHLORIDE CRYS ER 20 MEQ PO TBCR
40.0000 meq | EXTENDED_RELEASE_TABLET | Freq: Once | ORAL | Status: AC
  Administered 2021-05-19: 40 meq via ORAL
  Filled 2021-05-19: qty 2

## 2021-05-19 MED ORDER — INSULIN GLARGINE-YFGN 100 UNIT/ML ~~LOC~~ SOLN
35.0000 [IU] | Freq: Every day | SUBCUTANEOUS | Status: DC
  Administered 2021-05-20 – 2021-05-22 (×3): 35 [IU] via SUBCUTANEOUS
  Filled 2021-05-19 (×3): qty 0.35

## 2021-05-19 MED ORDER — FUROSEMIDE 10 MG/ML IJ SOLN
40.0000 mg | Freq: Two times a day (BID) | INTRAMUSCULAR | Status: DC
  Administered 2021-05-19 – 2021-05-21 (×5): 40 mg via INTRAVENOUS
  Filled 2021-05-19 (×5): qty 4

## 2021-05-19 NOTE — Progress Notes (Signed)
Progress Note  Patient Name: DANIYAL TABOR Date of Encounter: 05/19/2021  Primary Cardiologist: Parke Poisson, MD  Subjective   Denies any chest pain or dyspnea  Inpatient Medications    Scheduled Meds:  aspirin  81 mg Oral Daily   atorvastatin  40 mg Oral Daily   furosemide  40 mg Intravenous Q12H   insulin aspart  0-15 Units Subcutaneous TID WC   insulin aspart  0-5 Units Subcutaneous QHS   insulin aspart  4 Units Subcutaneous TID WC   insulin glargine-yfgn  30 Units Subcutaneous Daily   metoprolol succinate  12.5 mg Oral Daily   sodium chloride flush  3 mL Intravenous Q12H   Continuous Infusions:  sodium chloride     PRN Meds: sodium chloride, acetaminophen **OR** acetaminophen, acetaminophen, metoprolol tartrate, morphine injection, ondansetron **OR** ondansetron (ZOFRAN) IV, sodium chloride flush   Vital Signs    Vitals:   05/18/21 1827 05/18/21 2001 05/19/21 0448 05/19/21 0839  BP: 118/68 134/69 111/70   Pulse: 81 86 (!) 114 89  Resp: 18 18 18 16   Temp: 97.6 F (36.4 C) (!) 97.5 F (36.4 C) 98.3 F (36.8 C) 98.1 F (36.7 C)  TempSrc: Oral Oral Oral Oral  SpO2: 100% 98% 98%   Weight:   102.1 kg   Height:        Intake/Output Summary (Last 24 hours) at 05/19/2021 0905 Last data filed at 05/19/2021 0824 Gross per 24 hour  Intake 2066 ml  Output 1450 ml  Net 616 ml    Filed Weights   05/17/21 0000 05/18/21 0440 05/19/21 0448  Weight: 100.7 kg 101.7 kg 102.1 kg    Telemetry    Sinus rhythm with rate 50s.  Personally reviewed.  ECG    No new ECG  Physical Exam   GEN: No acute distress.   Neck: +JVD. Cardiac: RRR without gallop or rub.  Respiratory: Nonlabored.  Clear to auscultation. GI: Soft, nontender, bowel sounds present. MS: 1+ pitting edema. Neuro:  Nonfocal. Psych: Alert and oriented x 3. Normal affect.  Labs    Chemistry Recent Labs  Lab 05/15/21 0430 05/15/21 0541 05/17/21 0325 05/18/21 0244 05/19/21 0257  NA  142   < > 136 136 136  K 4.8   < > 3.9 3.8 3.7  CL 110   < > 104 103 105  CO2 20*   < > 22 25 23   GLUCOSE 237*   < > 309* 249* 258*  BUN 16   < > 17 17 16   CREATININE 1.16   < > 1.03 1.22 1.10  CALCIUM 9.0   < > 8.7* 8.6* 8.3*  PROT 7.7  --   --   --   --   ALBUMIN 3.7  --  2.9* 2.9* 2.5*  AST 36  --   --   --   --   ALT 32  --   --   --   --   ALKPHOS 72  --   --   --   --   BILITOT 0.2*  --   --   --   --   GFRNONAA >60   < > >60 >60 >60  ANIONGAP 12   < > 10 8 8    < > = values in this interval not displayed.      Hematology Recent Labs  Lab 05/16/21 0319 05/17/21 0325 05/18/21 0244 05/19/21 0257  WBC 5.9 5.5 6.4  --   RBC 3.69* 3.62*  3.55* 3.64*  --   HGB 9.6* 9.4* 9.5* 10.3*  HCT 31.8* 30.7* 31.5* 34.2*  MCV 86.2 84.8 86.5  --   MCH 26.0 26.0 26.1  --   MCHC 30.2 30.6 30.2  --   RDW 16.7* 16.7* 17.2*  --   PLT 251 247 253  --      Cardiac Enzymes Recent Labs  Lab 05/10/21 1025 05/10/21 1635 05/11/21 0804 05/15/21 0430 05/15/21 0600  TROPONINIHS 893* 1,326* 1,359* 176* 223*     BNP Recent Labs  Lab 05/17/21 0325 05/18/21 0244 05/19/21 0257  BNP 165.3* 108.0* 217.1*      Radiology    CARDIAC CATHETERIZATION  Result Date: 05/18/2021 Images from the original result were not included.   Prox RCA lesion is 100% stenosed.   Prox Cx to Mid Cx lesion is 60% stenosed.   LPAV lesion is 60% stenosed.   Ost LAD to Prox LAD lesion is 40% stenosed. MOHMMAD SALEEBY is a 55 y.o. male  [id number] LOCATION:  FACILITY: [facility] PHYSICIAN: Nanetta Batty, M.D. 1966/07/22 DATE OF PROCEDURE:  05/18/2021 DATE OF DISCHARGE: CARDIAC CATHETERIZATION History obtained from chart review.  55 year old Caucasian male with history of tobacco abuse, hypertension, hyperlipidemia and peripheral arterial disease.  He has a known occluded left common iliac artery.  He was recently mated with pneumonia and had positive enzymes.  2D echo was normal.  He was discharged home after refusing  diagnostic coronary angiography and awakened with shortness of breath and chest pain and was readmitted.  He presents now for diagnostic coronary angiography.   Mr. Gittins  has an occluded nondominant right otherwise nonobstructive CAD and a left dominant system.  He does have moderate left PDA disease.  His LVEDP was 21.  There were no "culprit lesions" requiring intervention.  Medical therapy will be recommended.  The sheath was removed and a TR band was placed on the right wrist to achieve patent hemostasis.  The patient left lab stable condition. Nanetta Batty. MD, [facility] 05/18/2021 2:36 PM     Cardiac Studies   Echocardiogram 05/11/2021:  1. Left ventricular ejection fraction, by estimation, is 50 to 55%. The  left ventricle has low normal function. The left ventricle demonstrates  regional wall motion abnormalities (see scoring diagram/findings for  description). There is moderate left  ventricular hypertrophy. Left ventricular diastolic parameters are  consistent with Grade I diastolic dysfunction (impaired relaxation). There  is moderate hypokinesis of the left ventricular, basal inferior wall and  inferolateral wall.   2. Right ventricular systolic function is normal. The right ventricular  size is normal. There is normal pulmonary artery systolic pressure. The  estimated right ventricular systolic pressure is 31.4 mmHg.   3. The mitral valve is grossly normal. Trivial mitral valve  regurgitation.   4. The aortic valve was not well visualized. Aortic valve regurgitation  is trivial.   5. The inferior vena cava is dilated in size with >50% respiratory  variability, suggesting right atrial pressure of 8 mmHg.  Assessment & Plan    NSTEMI during recent hospitalization, peak troponin I 1359 on September 26.  Cardiac catheterization was recommended but he declined at that time.  Presented back to the ED on 9/30 with chest pain and mild troponin elevation.  Now willing to undergo cath.   Echo 9/26 showed EF 50 to 55%, normal RV function, no significant valvular disease.  Cath 10/30 showed occluded nondominant RCA, proximal to mid LCx 60% stenosis, LPA V 60% stenosis, ostial  proximal LAD 40% stenosis. -Continue aspirin 81 mg daily, atorvastatin 40 mg daily,Toprol-XL 12.5 mg daily  Acute on chronic diastolic heart failure: Appears volume up on exam, LVEDP 21 on cath 10/3.  Continue IV Lasix 40 mg twice daily  Type 2 diabetes mellitus.  Hemoglobin A1c 6.8%.  He is on Glucophage XR, Glucotrol XL, and insulin as an outpatient.  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  Tobacco abuse in remission.  PAD with known aortoiliac disease.  Mixed hyperlipidemia, on Lipitor.  LDL 44.  Recent hospitalization with aspiration pneumonia versus chemical pneumonitis, has completed course of antibiotics.  Afebrile.  No cough.  Chronic anemia normal MCV.  Hemoglobin is stable at 9.5 today.  Suspected OSA: Plan outpatient sleep study    Signed, Little Ishikawa, MD  05/19/2021, 9:05 AM

## 2021-05-19 NOTE — Progress Notes (Signed)
Mobility Specialist Progress Note   05/19/21 1100  Mobility  Activity Ambulated in hall  Level of Assistance Minimal assist, patient does 75% or more  Assistive Device None  Distance Ambulated (ft) 250 ft (200+ 50)  Mobility Ambulated with assistance in hallway  Mobility Response Tolerated fair  Mobility performed by Mobility specialist  Bed Position Chair   Received pt sitting in recliner denying any pain just swelling in arms and feet. Agreeable to mobility session. Pt's gait is a little unsteady d/t swelling in feet, x3 LOB corrected w/ contact guard. On pt's last LOB they c/o being light headed, sat pt down in chair, wheeled back halfway and took BP. Pt stated to be feeling better and walked back to recliner in room. Call bell was left by pt's side w/ family member in room.     Pre Mobility: 101 HR During Mobility: 105 HR, 104/52 BP Post Mobility: 91 HR, 100/58 BP  Frederico Hamman Mobility Specialist Phone Number [phone]

## 2021-05-19 NOTE — Progress Notes (Deleted)
Cardiology Office Note:    Date:  05/19/2021   ID:  [name], DOB [birth year], MRN [id number]  PCP:  [name], MD  Cardiologist:  Parke Poisson, MD  Electrophysiologist:  None   Referring MD: [name], MD   Chief Complaint: ***  History of Present Illness:    Andres Richardson is a 55 y.o. male with a history of CAD with recent NSTEMI s/p ***, PAD with known aortoilliac occlusion disease as well as left common iliac occlusion and infrainguinal artery occlusive disease followed by Dr. 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 Jacques Navy and presents today for hospital follow-up of ***  Patient was recently admitted from 05/10/2021 to 05/14/2021 with acute combined respiratory failure secondary to aspiration pneumonia vs chemical pneumonitis and sepsis. He was treated with antibiotics.    Past Medical History:  Diagnosis Date   Diabetes mellitus without complication (HCC)    Type !! 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 Diabetic neuropathy.   Hyperlipidemia    Mixed   Hypertension    Essential   Microalbuminuria    Obesity    Peripheral arterial disease (HCC)    With Claudication   Tobacco use disorder     Past Surgical History:  Procedure Laterality Date   INCISION AND DRAINAGE ABSCESS N/A 02/24/2021   Procedure: INCISION AND DRAINAGE PERINEUM;  Surgeon: Sheliah Hatch De Blanch, MD;  Location: [facility];  Service: General;  Laterality: N/A;   INCISION AND DRAINAGE ABSCESS N/A 02/25/2021   Procedure: REPEAT WASHOUT OF PERINEUM;  Surgeon: [name], MD;  Location: [facility];  Service: General;  Laterality: N/A;   LAPAROSCOPIC DIVERTED COLOSTOMY N/A 02/25/2021   Procedure: LAPAROSCOPIC DIVERTING SIGMOID COLOSTOMY;  Surgeon: [name], MD;  Location:  [facility];  Service: General;  Laterality: N/A;   LEFT HEART CATH AND CORONARY ANGIOGRAPHY N/A 05/18/2021   Procedure: LEFT HEART CATH AND CORONARY ANGIOGRAPHY;  Surgeon: Runell Gess, MD;  Location: [facility];  Service: Cardiovascular;  Laterality: N/A;    Current Medications: No outpatient medications have been marked as taking for the 05/21/21 encounter (Appointment) with Corrin Parker, PA-C.     Allergies:   Adhesive [tape]   Social History   Socioeconomic History   Marital status: Single    Spouse name: Not on file   Number of children: Not on file   Years of education: Not on file   Highest education level: Not on file  Occupational History   Not on file  Tobacco Use   Smoking status: Former    Packs/day: 1.00    Types: Cigarettes    Quit date: 02/17/2021    Years since quitting: 0.2   Smokeless tobacco: Never   Tobacco comments:    Uses Nicorette Gum.   Vaping Use   Vaping Use: Never used  Substance and Sexual Activity   Alcohol use: Never   Drug use: Never   Sexual activity: Not on file  Other Topics Concern   Not on file  Social History Narrative   Not on file   Social Determinants of Health   Financial Resource Strain: Not on file  Food Insecurity: Not on file  Transportation Needs: Not on file  Physical Activity: Not on file  Stress: Not on file  Social Connections: Not on file  Family History: The patient's ***family history is not on file.  ROS:   Please see the history of present illness.    *** All other systems reviewed and are negative.  EKGs/Labs/Other Studies Reviewed:    The following studies were reviewed today: ***  EKG:  EKG *** ordered today. EKG personally reviewed and demonstrates ***.  Recent Labs: 05/15/2021: ALT 32 05/18/2021: Platelets 253 05/19/2021: B Natriuretic Peptide 217.1; BUN 16; Creatinine, Ser 1.10; Hemoglobin 10.3; Magnesium 1.9; Potassium 3.7; Sodium 136  Recent Lipid Panel    Component Value  Date/Time   CHOL 111 05/13/2021 0407   TRIG 197 (H) 05/13/2021 0407   HDL 28 (L) 05/13/2021 0407   CHOLHDL 4.0 05/13/2021 0407   VLDL 39 05/13/2021 0407   LDLCALC 44 05/13/2021 0407    Physical Exam:    Vital Signs: There were no vitals taken for this visit.    Wt Readings from Last 3 Encounters:  05/19/21 225 lb 1.4 oz (102.1 kg)  05/10/21 218 lb 4.1 oz (99 kg)  02/24/21 224 lb 3.3 oz (101.7 kg)     General: 55 y.o. male in no acute distress. HEENT: Normocephalic and atraumatic. Sclera clear. EOMs intact. Neck: Supple. No carotid bruits. No JVD. Heart: *** RRR. Distinct S1 and S2. No murmurs, gallops, or rubs. Radial and distal pedal pulses 2+ and equal bilaterally. Lungs: No increased work of breathing. Clear to ausculation bilaterally. No wheezes, rhonchi, or rales.  Abdomen: Soft, non-distended, and non-tender to palpation. Bowel sounds present in all 4 quadrants.  MSK: Normal strength and tone for age. *** Extremities: No lower extremity edema.    Skin: Warm and dry. Neuro: Alert and oriented x3. No focal deficits. Psych: Normal affect. Responds appropriately.   Assessment:    No diagnosis found.  Plan:     Disposition: Follow up in ***   Medication Adjustments/Labs and Tests Ordered: Current medicines are reviewed at length with the patient today.  Concerns regarding medicines are outlined above.  No orders of the defined types were placed in this encounter.  No orders of the defined types were placed in this encounter.   There are no Patient Instructions on file for this visit.   Signed, Corrin Parker, PA-C  05/19/2021 10:23 AM    [facility]

## 2021-05-19 NOTE — Progress Notes (Signed)
PROGRESS NOTE  Andres Richardson:[id number] DOB: Jan 09, 1966   PCP: [name], MD  Patient is from: Home.  Independently ambulates at baseline.  DOA: 05/15/2021 LOS: 4  Chief complaints:  Chief Complaint  Patient presents with   Shortness of Breath     Brief Narrative / Interim history: 55 year old M with PMH of IDDM-2, former 80-pack-year history of smoking (quit in 02/2021), Fournier's gangrene s/p debridement and loop sigmoid colostomy in 02/2021, and most recent hospitalization from 9/25-9/29 for acute hypoxemic and hypercapnic RF and sepsis in the setting of aspiration pneumonia and chemical pneumonitis, non-STEMI and DKA returning with acute chest pain and dyspnea about 4 AM while voiding that prompted him to call EMS.    In ED, he was slightly hypertensive.  He was tachypneic to 36 with saturation of 90%.  BNP 405.  Trop 176>> 223.  He was placed on BiPAP, IV Lasix, nitro drip and heparin, and admitted for chest pain with elevated troponin/possible non-STEMI and CHF exacerbation.    LHC on 10/3 showed occluded nondominant RCA, proximal to mid LCx 60% stenosis, LPA V 60% stenosis, ostial proximal LAD 40% stenosis.  On IV Lasix per cardiology.    Subjective: Seen and examined earlier this morning.  No major events overnight of this morning.  No complaints other than swelling in his legs.  Denies chest pain, dyspnea, orthopnea, PND, GI or UTI symptoms.  Sitting in bed and eating [employer] for breakfast.  Objective: Vitals:   05/18/21 2001 05/19/21 0448 05/19/21 0839 05/19/21 1122  BP: 134/69 111/70  99/71  Pulse: 86 (!) 114 89 92  Resp: 18 18 16 20   Temp: (!) 97.5 F (36.4 C) 98.3 F (36.8 C) 98.1 F (36.7 C) 98.1 F (36.7 C)  TempSrc: Oral Oral Oral Oral  SpO2: 98% 98%  97%  Weight:  102.1 kg    Height:        Intake/Output Summary (Last 24 hours) at 05/19/2021 1509 Last data filed at 05/19/2021 1233 Gross per 24 hour  Intake 2906 ml  Output 1950 ml  Net 956 ml    Filed Weights   05/17/21 0000 05/18/21 0440 05/19/21 0448  Weight: 100.7 kg 101.7 kg 102.1 kg    Examination:  GENERAL: No apparent distress.  Nontoxic. HEENT: MMM.  Vision and hearing grossly intact.  NECK: Supple.  No apparent JVD.  RESP: 97% on RA.  No IWOB.  Fair aeration bilaterally. CVS:  RRR. Heart sounds normal.  ABD/GI/GU: BS+. Abd soft, NTND.  MSK/EXT:  Moves extremities. No apparent deformity.  1-2 BLE edema SKIN: no apparent skin lesion or wound NEURO: Awake and alert. Oriented appropriately.  No apparent focal neuro deficit. PSYCH: Calm. Normal affect.   Procedures:  None  Microbiology summarized: COVID-19 and influenza PCR nonreactive.  Assessment & Plan: Acute respiratory failure with hypoxia: Likely due to CHF or undiagnosed COPD and OSA. Desaturated to 88% while asleep the day of admission.  Carries 80-pack-year history of smoking.  Resolved. -Needs outpatient PFTs and sleep study -Manage CHF as below -CPAP/BiPAP at night  Elevated troponin/recent NSTEMI: Presents with acute chest pain and dyspnea.  Troponin 07/19/21 (peaked at 1326 prior to admission when he declined heart cath).  EKG with sinus tachycardia at 140s with what looks like PACs.  TTE on 9/26 with LVEF of 50 to 55%, R WMA, G1 DD, RVSP of 31.  Cardiopulmonary symptoms resolved.  LHC as above. -Continue metoprolol, atorvastatin and ASA  -Manage CHF as below  Acute diastolic CHF: TTE as above.  BNP 405 (slightly higher than prior admission).  Significant improvement in his symptoms and CXR after a dose of IV Lasix.  About 1.5 L UOP/24 hours.  Net -6 L.  Cr stable.  Still with significant BLE edema.  Unfortunately, eating [employer] for breakfast. -Cardiology following -Restart IV Lasix 40 mg twice daily -Monitor fluid status, renal functions and electrolytes. -Sodium and fluid restriction -Encouraged to avoid fast food -Leg elevation.  Controlled IDDM-2 with hyperglycemia and other vascular  complication: A1c 6.8% on 9/28. Recent Labs  Lab 05/18/21 1132 05/18/21 1653 05/18/21 2039 05/19/21 0608 05/19/21 1124  GLUCAP 209* 123* 156* 234* 279*  -Increase Lantus from 30 to 35 units daily -Change SSI to resistant -Increase NovoLog from 4 to 6 units 3 times daily -Continue statin  Normocytic anemia: H&H seems to be at baseline.  Anemia panel normal. Recent Labs    05/10/21 0815 05/11/21 0250 05/13/21 0407 05/14/21 0343 05/15/21 0430 05/15/21 0541 05/16/21 0319 05/17/21 0325 05/18/21 0244 05/19/21 0257  HGB 12.6* 9.2* 8.8* 8.8* 11.1* 8.8* 9.6* 9.4* 9.5* 10.3*  -Continue monitoring  Hyperlipidemia -Continue statin as above   Recent hospitalization for aspiration pneumonia vs. Chemical pneumonitis -Completed course of Augmentin   Recent Fournier's gangrene in July 2022--s/p surgical debridement and ostomy.  -Plan for ostomy reversal in November.  -Appreciate help by [facility]OC[facility].  Hypokalemia/hypomagnesemia: K3.7. -P.o. KCl 40x1  Class I obesity Body mass index is 31.39 kg/m.         DVT prophylaxis:    Code Status: Full code Family Communication: Updated patient's brother at bedside. Level of care: Telemetry Cardiac.  Status is: Inpatient  Remains inpatient appropriate because:Ongoing diagnostic testing needed not appropriate for outpatient work up, IV treatments appropriate due to intensity of illness or inability to take PO, and Inpatient level of care appropriate due to severity of illness  Dispo: The patient is from: Home              Anticipated d/c is to: Home              Patient currently is not medically stable to d/c.   Difficult to place patient No       Consultants:  Cardiology   Sch Meds:  Scheduled Meds:  aspirin  81 mg Oral Daily   atorvastatin  40 mg Oral Daily   enoxaparin (LOVENOX) injection  40 mg Subcutaneous Q24H   furosemide  40 mg Intravenous Q12H   insulin aspart  0-20 Units Subcutaneous TID WC   insulin aspart  0-5  Units Subcutaneous QHS   insulin aspart  0-5 Units Subcutaneous QHS   insulin aspart  6 Units Subcutaneous TID WC   [START ON 05/20/2021] insulin glargine-yfgn  35 Units Subcutaneous Daily   metoprolol succinate  12.5 mg Oral Daily   sodium chloride flush  3 mL Intravenous Q12H   Continuous Infusions:  sodium chloride     PRN Meds:.sodium chloride, acetaminophen **OR** acetaminophen, acetaminophen, metoprolol tartrate, morphine injection, ondansetron **OR** ondansetron (ZOFRAN) IV, sodium chloride flush  Antimicrobials: Anti-infectives (From admission, onward)    Start     Dose/Rate Route Frequency Ordered Stop   05/15/21 2200  amoxicillin-clavulanate (AUGMENTIN) 875-125 MG per tablet 1 tablet        1 tablet Oral Every 12 hours 05/15/21 1517 05/16/21 2046        I have personally reviewed the following labs and images: CBC: Recent Labs  Lab 05/14/21 0343 05/15/21 0430  05/15/21 0541 05/16/21 0319 05/17/21 0325 05/18/21 0244 05/19/21 0257  WBC 4.7 12.0*  --  5.9 5.5 6.4  --   NEUTROABS  --  5.6  --   --   --   --   --   HGB 8.8* 11.1* 8.8* 9.6* 9.4* 9.5* 10.3*  HCT 28.9* 37.4* 26.0* 31.8* 30.7* 31.5* 34.2*  MCV 85.0 87.6  --  86.2 84.8 86.5  --   PLT 232 456*  --  251 247 253  --    BMP &GFR Recent Labs  Lab 05/15/21 0430 05/15/21 0541 05/16/21 0319 05/17/21 0325 05/18/21 0244 05/19/21 0257  NA 142 143 136 136 136 136  K 4.8 3.9 3.6 3.9 3.8 3.7  CL 110  --  104 104 103 105  CO2 20*  --  24 22 25 23   GLUCOSE 237*  --  326* 309* 249* 258*  BUN 16  --  15 17 17 16   CREATININE 1.16  --  1.15 1.03 1.22 1.10  CALCIUM 9.0  --  8.3* 8.7* 8.6* 8.3*  MG  --   --   --  1.6* 1.7 1.9  PHOS  --   --   --  3.3 3.8 3.9   Estimated Creatinine Clearance: 92.3 mL/min (by C-G formula based on SCr of 1.1 mg/dL). Liver & Pancreas: Recent Labs  Lab 05/15/21 0430 05/17/21 0325 05/18/21 0244 05/19/21 0257  AST 36  --   --   --   ALT 32  --   --   --   ALKPHOS 72  --   --    --   BILITOT 0.2*  --   --   --   PROT 7.7  --   --   --   ALBUMIN 3.7 2.9* 2.9* 2.5*   No results for input(s): LIPASE, AMYLASE in the last 168 hours. No results for input(s): AMMONIA in the last 168 hours. Diabetic: No results for input(s): HGBA1C in the last 72 hours. Recent Labs  Lab 05/18/21 1132 05/18/21 1653 05/18/21 2039 05/19/21 0608 05/19/21 1124  GLUCAP 209* 123* 156* 234* 279*   Cardiac Enzymes: No results for input(s): CKTOTAL, CKMB, CKMBINDEX, TROPONINI in the last 168 hours. No results for input(s): PROBNP in the last 8760 hours. Coagulation Profile: No results for input(s): INR, PROTIME in the last 168 hours.  Thyroid Function Tests: No results for input(s): TSH, T4TOTAL, FREET4, T3FREE, THYROIDAB in the last 72 hours. Lipid Profile: No results for input(s): CHOL, HDL, LDLCALC, TRIG, CHOLHDL, LDLDIRECT in the last 72 hours. Anemia Panel: Recent Labs    05/17/21 0325  VITAMINB12 289  FOLATE 8.5  FERRITIN 148  TIBC 329  IRON 65  RETICCTPCT 3.4*   Urine analysis:    Component Value Date/Time   COLORURINE COLORLESS (A) 05/10/2021 0808   APPEARANCEUR CLEAR 05/10/2021 0808   LABSPEC 1.010 05/10/2021 0808   PHURINE 6.0 05/10/2021 0808   GLUCOSEU 500 (A) 05/10/2021 0808   HGBUR NEGATIVE 05/10/2021 0808   BILIRUBINUR NEGATIVE 05/10/2021 0808   KETONESUR NEGATIVE 05/10/2021 0808   PROTEINUR 100 (A) 05/10/2021 0808   NITRITE NEGATIVE 05/10/2021 0808   LEUKOCYTESUR NEGATIVE 05/10/2021 0808   Sepsis Labs: Invalid input(s): PROCALCITONIN, LACTICIDVEN  Microbiology: Recent Results (from the past 240 hour(s))  Blood Culture (routine x 2)     Status: None   Collection Time: 05/10/21  8:00 AM   Specimen: BLOOD  Result Value Ref Range Status   Specimen Description   Final  BLOOD LEFT UPPER ARM Performed at [facility], [address], [place], [place] [address]    Special Requests   Final    BOTTLES DRAWN AEROBIC AND ANAEROBIC  Blood Culture adequate volume Performed at [facility], [address], [place], [place] [address]    Culture   Final    NO GROWTH 5 DAYS Performed at [facility], [address] [place], [place] [address]    Report Status 05/15/2021 FINAL  Final  Blood Culture (routine x 2)     Status: None   Collection Time: 05/10/21  8:07 AM   Specimen: BLOOD RIGHT HAND  Result Value Ref Range Status   Specimen Description   Final    BLOOD RIGHT HAND Performed at [facility], [address] [place], [place] [address]    Special Requests   Final    BOTTLES DRAWN AEROBIC AND ANAEROBIC Blood Culture adequate volume Performed at [facility], [address] [place], [place] [address]    Culture   Final    NO GROWTH 5 DAYS Performed at [facility], [address] [place], [place] [address]    Report Status 05/15/2021 FINAL  Final  Resp Panel by RT-PCR (Flu A&B, Covid) Nasopharyngeal Swab     Status: None   Collection Time: 05/10/21 10:25 AM   Specimen: Nasopharyngeal Swab; Nasopharyngeal(NP) swabs in vial transport medium  Result Value Ref Range Status   SARS Coronavirus 2 by RT PCR NEGATIVE NEGATIVE Final    Comment: (NOTE) SARS-CoV-2 target nucleic acids are NOT DETECTED.  The SARS-CoV-2 RNA is generally detectable in upper respiratory specimens during the acute phase of infection. The lowest concentration of SARS-CoV-2 viral copies this assay can detect is 138 copies/mL. A negative result does not preclude SARS-Cov-2 infection and should not be used as the sole basis for treatment or other patient management decisions. A negative result may occur with  improper specimen collection/handling, submission of specimen other than nasopharyngeal swab, presence of viral mutation(s) within the areas targeted by this assay, and inadequate number of viral copies(<138 copies/mL). A negative result must be combined  with clinical observations, patient history, and epidemiological information. The expected result is Negative.  Fact Sheet for Patients:  BloggerCourse.com  Fact Sheet for Healthcare Providers:  SeriousBroker.it  This test is no t yet approved or cleared by the [place] FDA and  has been authorized for detection and/or diagnosis of SARS-CoV-2 by FDA under an Emergency Use Authorization (EUA). This EUA will remain  in effect (meaning this test can be used) for the duration of the COVID-19 declaration under Section 564(b)(1) of the Act, 21 U.S.C.section 360bbb-3(b)(1), unless the authorization is terminated  or revoked sooner.       Influenza A by PCR NEGATIVE NEGATIVE Final   Influenza B by PCR NEGATIVE NEGATIVE Final    Comment: (NOTE) The Xpert Xpress SARS-CoV-2/FLU/RSV plus assay is intended as an aid in the diagnosis of influenza from Nasopharyngeal swab specimens and should not be used as a sole basis for treatment. Nasal washings and aspirates are unacceptable for Xpert Xpress SARS-CoV-2/FLU/RSV testing.  Fact Sheet for Patients: BloggerCourse.com  Fact Sheet for Healthcare Providers: SeriousBroker.it  This test is not yet approved or cleared by the [place] FDA and has been authorized for detection and/or diagnosis of SARS-CoV-2 by FDA under an Emergency Use Authorization (EUA). This EUA will remain in effect (meaning this test can be used) for the duration of  the COVID-19 declaration under Section 564(b)(1) of the Act, 21 U.S.C. section 360bbb-3(b)(1), unless the authorization is terminated or revoked.  Performed at [facility], [address] [place], [place] [address]   MRSA Next Gen by PCR, Nasal     Status: None   Collection Time: 05/10/21 11:51 AM   Specimen: Nasal Mucosa; Nasal Swab  Result Value Ref Range Status   MRSA by PCR  Next Gen NOT DETECTED NOT DETECTED Final    Comment: (NOTE) The GeneXpert MRSA Assay (FDA approved for NASAL specimens only), is one component of a comprehensive MRSA colonization surveillance program. It is not intended to diagnose MRSA infection nor to guide or monitor treatment for MRSA infections. Test performance is not FDA approved in patients less than 39 years old. Performed at [facility], [address], [place], [place] [address]   Resp Panel by RT-PCR (Flu A&B, Covid) Nasopharyngeal Swab     Status: None   Collection Time: 05/15/21  5:45 AM   Specimen: Nasopharyngeal Swab; Nasopharyngeal(NP) swabs in vial transport medium  Result Value Ref Range Status   SARS Coronavirus 2 by RT PCR NEGATIVE NEGATIVE Final    Comment: (NOTE) SARS-CoV-2 target nucleic acids are NOT DETECTED.  The SARS-CoV-2 RNA is generally detectable in upper respiratory specimens during the acute phase of infection. The lowest concentration of SARS-CoV-2 viral copies this assay can detect is 138 copies/mL. A negative result does not preclude SARS-Cov-2 infection and should not be used as the sole basis for treatment or other patient management decisions. A negative result may occur with  improper specimen collection/handling, submission of specimen other than nasopharyngeal swab, presence of viral mutation(s) within the areas targeted by this assay, and inadequate number of viral copies(<138 copies/mL). A negative result must be combined with clinical observations, patient history, and epidemiological information. The expected result is Negative.  Fact Sheet for Patients:  BloggerCourse.com  Fact Sheet for Healthcare Providers:  SeriousBroker.it  This test is no t yet approved or cleared by the [place] FDA and  has been authorized for detection and/or diagnosis of SARS-CoV-2 by FDA under an Emergency Use Authorization (EUA).  This EUA will remain  in effect (meaning this test can be used) for the duration of the COVID-19 declaration under Section 564(b)(1) of the Act, 21 U.S.C.section 360bbb-3(b)(1), unless the authorization is terminated  or revoked sooner.       Influenza A by PCR NEGATIVE NEGATIVE Final   Influenza B by PCR NEGATIVE NEGATIVE Final    Comment: (NOTE) The Xpert Xpress SARS-CoV-2/FLU/RSV plus assay is intended as an aid in the diagnosis of influenza from Nasopharyngeal swab specimens and should not be used as a sole basis for treatment. Nasal washings and aspirates are unacceptable for Xpert Xpress SARS-CoV-2/FLU/RSV testing.  Fact Sheet for Patients: BloggerCourse.com  Fact Sheet for Healthcare Providers: SeriousBroker.it  This test is not yet approved or cleared by the [place] FDA and has been authorized for detection and/or diagnosis of SARS-CoV-2 by FDA under an Emergency Use Authorization (EUA). This EUA will remain in effect (meaning this test can be used) for the duration of the COVID-19 declaration under Section 564(b)(1) of the Act, 21 U.S.C. section 360bbb-3(b)(1), unless the authorization is terminated or revoked.  Performed at [facility], [address] [place], [place] [address]     Radiology Studies: No results found.    Oluwadamilola Deliz T. Fayetta Sorenson [facility]  If 7PM-7AM, please contact night-coverage [email or web address] 05/19/2021, 3:09 PM

## 2021-05-20 LAB — GLUCOSE, CAPILLARY
Glucose-Capillary: 208 mg/dL — ABNORMAL HIGH (ref 70–99)
Glucose-Capillary: 157 mg/dL — ABNORMAL HIGH (ref 70–99)
Glucose-Capillary: 95 mg/dL (ref 70–99)
Glucose-Capillary: 225 mg/dL — ABNORMAL HIGH (ref 70–99)

## 2021-05-20 LAB — RENAL FUNCTION PANEL
Albumin: 2.9 g/dL — ABNORMAL LOW (ref 3.5–5.0)
Anion gap: 10 (ref 5–15)
BUN: 17 mg/dL (ref 6–20)
CO2: 24 mmol/L (ref 22–32)
Calcium: 8.6 mg/dL — ABNORMAL LOW (ref 8.9–10.3)
Chloride: 104 mmol/L (ref 98–111)
Creatinine, Ser: 1.16 mg/dL (ref 0.61–1.24)
GFR, Estimated: >60 mL/min (ref >60)
Glucose, Bld: 227 mg/dL — ABNORMAL HIGH (ref 70–99)
Phosphorus: 4.3 mg/dL (ref 2.5–4.6)
Potassium: 3.7 mmol/L (ref 3.5–5.1)
Sodium: 138 mmol/L (ref 135–145)

## 2021-05-20 LAB — MAGNESIUM: Magnesium: 1.6 mg/dL — ABNORMAL LOW (ref 1.7–2.4)

## 2021-05-20 LAB — HEMOGLOBIN AND HEMATOCRIT, BLOOD
HCT: 33.5 % — ABNORMAL LOW (ref 39.0–52.0)
Hemoglobin: 10.3 g/dL — ABNORMAL LOW (ref 13.0–17.0)

## 2021-05-20 LAB — BRAIN NATRIURETIC PEPTIDE: B Natriuretic Peptide: 66.7 pg/mL (ref 0.0–100.0)

## 2021-05-20 LAB — GLUTAMIC ACID DECARBOXYLASE AUTO ABS: Glutamic Acid Decarb Ab: <5 U/mL (ref 0.0–5.0)

## 2021-05-20 MED ORDER — POTASSIUM CHLORIDE CRYS ER 20 MEQ PO TBCR
40.0000 meq | EXTENDED_RELEASE_TABLET | Freq: Once | ORAL | Status: AC
  Administered 2021-05-20: 40 meq via ORAL
  Filled 2021-05-20: qty 2

## 2021-05-20 NOTE — TOC Progression Note (Addendum)
Transition of Care [facility]) - Progression Note    Patient Details  Name: Andres Richardson MRN: [id number] Date of Birth: Jan 29, 1966  Transition of [facility]) CM/SW Contact  Leone Haven, RN Phone Number: 05/20/2021, 6:38 PM  Clinical Narrative:    NCM spoke with patient he has PCP, [name] and he would like to continue seeing her, he states he will have money to get his medications at discharge. He is for Renal Bx on Monday. He is a retired MD.          Radio producer and Services                                                 Social Determinants of Health (SDOH) Interventions    Readmission Risk Interventions No flowsheet data found.

## 2021-05-20 NOTE — Progress Notes (Signed)
Mobility Specialist Progress Note   05/20/21 0929  Mobility  Activity Refused mobility  Pt upset this morning about extended d/c, didn't want to be bothered. Requested for me to come back after lunch.   Frederico Hamman Mobility Specialist Phone Number [phone]

## 2021-05-20 NOTE — Progress Notes (Signed)
Pt refuses CPAP 

## 2021-05-20 NOTE — Progress Notes (Signed)
9 PROGRESS NOTE    Andres Richardson  IFO:[id number] DOB: 1966-08-06 DOA: 05/15/2021 PCP: [name], MD    Chief Complaint  Patient presents with   Shortness of Breath    Brief Narrative:  55 year old M with PMH of IDDM-2, former 80-pack-year history of smoking (quit in 02/2021), Fournier's gangrene s/p debridement and loop sigmoid colostomy in 02/2021, and most recent hospitalization from 9/25-9/29 for acute hypoxemic and hypercapnic RF and sepsis in the setting of aspiration pneumonia and chemical pneumonitis, non-STEMI and DKA returning with acute chest pain and dyspnea about 4 AM while voiding that prompted him to call EMS.    In ED, he was slightly hypertensive.  He was tachypneic to 36 with saturation of 90%.  BNP 405.  Trop 176>> 223.  He was placed on BiPAP, IV Lasix, nitro drip and heparin, and admitted for chest pain with elevated troponin/possible non-STEMI and CHF exacerbation.     LHC on 10/3 showed occluded nondominant RCA, proximal to mid LCx 60% stenosis, LPA V 60% stenosis, ostial proximal LAD 40% stenosis.   On IV Lasix per cardiology.    Assessment & Plan:   Principal Problem:   Acute respiratory failure with hypoxia (HCC) Active Problems:   Type 2 diabetes mellitus without complication, with long-term current use of insulin (HCC)   Elevated troponin   Hyperlipidemia   CAD (coronary artery disease)   Acute diastolic CHF (congestive heart failure) (HCC)   Acute respiratory failure with hypoxia appears to have resolved. Probably secondary to acute on chronic diastolic heart failure Echocardiogram showed preserved left ventricular ejection fraction with right wall motion abnormalities and grade 1 diastolic dysfunction.  Cardiology consulted and plan for IV lasix 40 mg bid.  Continue with strict intake and output and daily weights Monitor renal parameters while on Lasix.    Elevated troponin/NSTEMI probably from demand ischemia from CHF.  Cath 10/30 showed  occluded nondominant RCA, proximal to mid LCx 60% stenosis, LPA V 60% stenosis, ostial proximal LAD 40% stenosis. Continue with aspirin 81 mg daily, atorvastatin 40 mg daily,Toprol-XL 12.5 mg daily   Recent hospitalization for aspiration pneumonia Completed a course of Augmentin.   Recent fournier's gangrene in July 2022  s/p surgical debridement and ostomy Plan for ostomy reversal in November.   Hypokalemia and hypomagnesemia:  Replaced.   Anemia of chronic disease Continue to monitor.  Peripheral artery disease   Hyperlipidemia Continue with the Lipitor.   Suspected obstructive sleep apnea recommend outpatient follow-up with pulmonology for sleep studies.   Insulin-dependent diabetes mellitus with hyperglycemia Continue with sliding scale insulin and Lantus. Last hemoglobin A1c at 6.8%    DVT prophylaxis: (Lovenox/) Code Status: full code.  Family Communication: family at bedside.  Disposition:   Status is: Inpatient  Remains inpatient appropriate because:IV treatments appropriate due to intensity of illness or inability to take PO  Dispo: The patient is from: Home              Anticipated d/c is to: Home              Patient currently is not medically stable to d/c.   Difficult to place patient No       Consultants:  Cardiology.   Procedures: none.   Antimicrobials: none.    Subjective: Pt reports breathing s has improved.  Understands he needs to stay another day for further diuresis.   Objective: Vitals:   05/19/21 1122 05/19/21 2026 05/20/21 0430 05/20/21 0836  BP: 99/71 121/60 122/65 Marland Kitchen)  112/54  Pulse: 92 92 81 89  Resp: 20 20 18    Temp: 98.1 F (36.7 C) 100.1 F (37.8 C) 98.1 F (36.7 C) 98.6 F (37 C)  TempSrc: Oral Oral Oral Oral  SpO2: 97% 99% 100% 100%  Weight:   101.2 kg   Height:        Intake/Output Summary (Last 24 hours) at 05/20/2021 1139 Last data filed at 05/20/2021 0923 Gross per 24 hour  Intake 1080 ml  Output  4250 ml  Net -3170 ml   Filed Weights   05/18/21 0440 05/19/21 0448 05/20/21 0430  Weight: 101.7 kg 102.1 kg 101.2 kg    Examination:  General exam: Appears calm and comfortable  Respiratory system: Clear to auscultation. Respiratory effort normal. Cardiovascular system: S1 & S2 heard, RRR. JVD, improving pedal edema.  Gastrointestinal system: Abdomen is nondistended, soft and nontender. Normal bowel sounds heard. Central nervous system: Alert and oriented. No focal neurological deficits. Extremities: Symmetric 5 x 5 power. Skin: No rashes, lesions or ulcers Psychiatry: Mood & affect appropriate.     Data Reviewed: I have personally reviewed following labs and imaging studies  CBC: Recent Labs  Lab 05/14/21 0343 05/15/21 0430 05/15/21 0541 05/16/21 0319 05/17/21 0325 05/18/21 0244 05/19/21 0257 05/20/21 0310  WBC 4.7 12.0*  --  5.9 5.5 6.4  --   --   NEUTROABS  --  5.6  --   --   --   --   --   --   HGB 8.8* 11.1*   < > 9.6* 9.4* 9.5* 10.3* 10.3*  HCT 28.9* 37.4*   < > 31.8* 30.7* 31.5* 34.2* 33.5*  MCV 85.0 87.6  --  86.2 84.8 86.5  --   --   PLT 232 456*  --  251 247 253  --   --    < > = values in this interval not displayed.    Basic Metabolic Panel: Recent Labs  Lab 05/16/21 0319 05/17/21 0325 05/18/21 0244 05/19/21 0257 05/20/21 0310  NA 136 136 136 136 138  K 3.6 3.9 3.8 3.7 3.7  CL 104 104 103 105 104  CO2 24 22 25 23 24   GLUCOSE 326* 309* 249* 258* 227*  BUN 15 17 17 16 17   CREATININE 1.15 1.03 1.22 1.10 1.16  CALCIUM 8.3* 8.7* 8.6* 8.3* 8.6*  MG  --  1.6* 1.7 1.9 1.6*  PHOS  --  3.3 3.8 3.9 4.3    GFR: Estimated Creatinine Clearance: 87.2 mL/min (by C-G formula based on SCr of 1.16 mg/dL).  Liver Function Tests: Recent Labs  Lab 05/15/21 0430 05/17/21 0325 05/18/21 0244 05/19/21 0257 05/20/21 0310  AST 36  --   --   --   --   ALT 32  --   --   --   --   ALKPHOS 72  --   --   --   --   BILITOT 0.2*  --   --   --   --   PROT 7.7   --   --   --   --   ALBUMIN 3.7 2.9* 2.9* 2.5* 2.9*    CBG: Recent Labs  Lab 05/19/21 1124 05/19/21 1601 05/19/21 2138 05/20/21 0558 05/20/21 1116  GLUCAP 279* 275* 198* 208* 157*     Recent Results (from the past 240 hour(s))  MRSA Next Gen by PCR, Nasal     Status: None   Collection Time: 05/10/21 11:51 AM   Specimen: Nasal  Mucosa; Nasal Swab  Result Value Ref Range Status   MRSA by PCR Next Gen NOT DETECTED NOT DETECTED Final    Comment: (NOTE) The GeneXpert MRSA Assay (FDA approved for NASAL specimens only), is one component of a comprehensive MRSA colonization surveillance program. It is not intended to diagnose MRSA infection nor to guide or monitor treatment for MRSA infections. Test performance is not FDA approved in patients less than 48 years old. Performed at [facility], [address], [place], [place] [address]   Resp Panel by RT-PCR (Flu A&B, Covid) Nasopharyngeal Swab     Status: None   Collection Time: 05/15/21  5:45 AM   Specimen: Nasopharyngeal Swab; Nasopharyngeal(NP) swabs in vial transport medium  Result Value Ref Range Status   SARS Coronavirus 2 by RT PCR NEGATIVE NEGATIVE Final    Comment: (NOTE) SARS-CoV-2 target nucleic acids are NOT DETECTED.  The SARS-CoV-2 RNA is generally detectable in upper respiratory specimens during the acute phase of infection. The lowest concentration of SARS-CoV-2 viral copies this assay can detect is 138 copies/mL. A negative result does not preclude SARS-Cov-2 infection and should not be used as the sole basis for treatment or other patient management decisions. A negative result may occur with  improper specimen collection/handling, submission of specimen other than nasopharyngeal swab, presence of viral mutation(s) within the areas targeted by this assay, and inadequate number of viral copies(<138 copies/mL). A negative result must be combined with clinical observations, patient history,  and epidemiological information. The expected result is Negative.  Fact Sheet for Patients:  BloggerCourse.com  Fact Sheet for Healthcare Providers:  SeriousBroker.it  This test is no t yet approved or cleared by the [place] FDA and  has been authorized for detection and/or diagnosis of SARS-CoV-2 by FDA under an Emergency Use Authorization (EUA). This EUA will remain  in effect (meaning this test can be used) for the duration of the COVID-19 declaration under Section 564(b)(1) of the Act, 21 U.S.C.section 360bbb-3(b)(1), unless the authorization is terminated  or revoked sooner.       Influenza A by PCR NEGATIVE NEGATIVE Final   Influenza B by PCR NEGATIVE NEGATIVE Final    Comment: (NOTE) The Xpert Xpress SARS-CoV-2/FLU/RSV plus assay is intended as an aid in the diagnosis of influenza from Nasopharyngeal swab specimens and should not be used as a sole basis for treatment. Nasal washings and aspirates are unacceptable for Xpert Xpress SARS-CoV-2/FLU/RSV testing.  Fact Sheet for Patients: BloggerCourse.com  Fact Sheet for Healthcare Providers: SeriousBroker.it  This test is not yet approved or cleared by the [place] FDA and has been authorized for detection and/or diagnosis of SARS-CoV-2 by FDA under an Emergency Use Authorization (EUA). This EUA will remain in effect (meaning this test can be used) for the duration of the COVID-19 declaration under Section 564(b)(1) of the Act, 21 U.S.C. section 360bbb-3(b)(1), unless the authorization is terminated or revoked.  Performed at [facility], [address] [place], [place] [address]          Radiology Studies: CARDIAC CATHETERIZATION  Result Date: 05/18/2021 Images from the original result were not included.   Prox RCA lesion is 100% stenosed.   Prox Cx to Mid Cx lesion is 60%  stenosed.   LPAV lesion is 60% stenosed.   Ost LAD to Prox LAD lesion is 40% stenosed. Andres Richardson is a 55 y.o. male  [id number] LOCATION:  FACILITY: [facility] PHYSICIAN: Nanetta Batty, M.D. 01/01/1966 DATE OF PROCEDURE:  05/18/2021 DATE OF DISCHARGE: CARDIAC CATHETERIZATION History obtained from chart review.  55 year old Caucasian male with history of tobacco abuse, hypertension, hyperlipidemia and peripheral arterial disease.  He has a known occluded left common iliac artery.  He was recently mated with pneumonia and had positive enzymes.  2D echo was normal.  He was discharged home after refusing diagnostic coronary angiography and awakened with shortness of breath and chest pain and was readmitted.  He presents now for diagnostic coronary angiography.   Mr. Beavers  has an occluded nondominant right otherwise nonobstructive CAD and a left dominant system.  He does have moderate left PDA disease.  His LVEDP was 21.  There were no "culprit lesions" requiring intervention.  Medical therapy will be recommended.  The sheath was removed and a TR band was placed on the right wrist to achieve patent hemostasis.  The patient left lab stable condition. Nanetta Batty. MD, [facility] 05/18/2021 2:36 PM         Scheduled Meds:  aspirin  81 mg Oral Daily   atorvastatin  40 mg Oral Daily   enoxaparin (LOVENOX) injection  40 mg Subcutaneous Q24H   furosemide  40 mg Intravenous Q12H   insulin aspart  0-20 Units Subcutaneous TID WC   insulin aspart  0-5 Units Subcutaneous QHS   insulin aspart  6 Units Subcutaneous TID WC   insulin glargine-yfgn  35 Units Subcutaneous Daily   metoprolol succinate  12.5 mg Oral Daily   potassium chloride  40 mEq Oral Once   sodium chloride flush  3 mL Intravenous Q12H   Continuous Infusions:  sodium chloride       LOS: 5 days        Kathlen Mody, MD [facility]   To contact the attending provider between 7A-7P or the covering provider during after hours 7P-7A,  please log into the web site [email or web address] and access using universal  password for that web site. If you do not have the password, please call the hospital operator.  05/20/2021, 11:39 AM

## 2021-05-20 NOTE — Progress Notes (Signed)
Progress Note  Patient Name: Andres Richardson Date of Encounter: 05/20/2021  Primary Cardiologist: Parke Poisson, MD  Subjective   Net -1.8 L yesterday on IV Lasix 40 mg twice daily.  -7.8 L on admission.  Creatinine stable at 1.2.  Reports dyspnea improved.  Inpatient Medications    Scheduled Meds:  aspirin  81 mg Oral Daily   atorvastatin  40 mg Oral Daily   enoxaparin (LOVENOX) injection  40 mg Subcutaneous Q24H   furosemide  40 mg Intravenous Q12H   insulin aspart  0-20 Units Subcutaneous TID WC   insulin aspart  0-5 Units Subcutaneous QHS   insulin aspart  6 Units Subcutaneous TID WC   insulin glargine-yfgn  35 Units Subcutaneous Daily   metoprolol succinate  12.5 mg Oral Daily   sodium chloride flush  3 mL Intravenous Q12H   Continuous Infusions:  sodium chloride     PRN Meds: sodium chloride, acetaminophen **OR** acetaminophen, acetaminophen, metoprolol tartrate, morphine injection, ondansetron **OR** ondansetron (ZOFRAN) IV, sodium chloride flush   Vital Signs    Vitals:   05/19/21 1122 05/19/21 2026 05/20/21 0430 05/20/21 0836  BP: 99/71 121/60 122/65 (!) 112/54  Pulse: 92 92 81 89  Resp: 20 20 18    Temp: 98.1 F (36.7 C) 100.1 F (37.8 C) 98.1 F (36.7 C) 98.6 F (37 C)  TempSrc: Oral Oral Oral Oral  SpO2: 97% 99% 100% 100%  Weight:   101.2 kg   Height:        Intake/Output Summary (Last 24 hours) at 05/20/2021 0905 Last data filed at 05/20/2021 07/20/2021 Gross per 24 hour  Intake 1560 ml  Output 3250 ml  Net -1690 ml    Filed Weights   05/18/21 0440 05/19/21 0448 05/20/21 0430  Weight: 101.7 kg 102.1 kg 101.2 kg    Telemetry    Sinus rhythm with rate 80s to 90s.  Personally reviewed.  ECG    No new ECG  Physical Exam   GEN: No acute distress.   Neck: +JVD. Cardiac: RRR without gallop or rub.  Respiratory: Nonlabored.  Clear to auscultation. GI: Soft, nontender, bowel sounds present. MS: Trace pitting edema. Neuro:   Nonfocal. Psych: Alert and oriented x 3. Normal affect.  Labs    Chemistry Recent Labs  Lab 05/15/21 0430 05/15/21 0541 05/18/21 0244 05/19/21 0257 05/20/21 0310  NA 142   < > 136 136 138  K 4.8   < > 3.8 3.7 3.7  CL 110   < > 103 105 104  CO2 20*   < > 25 23 24   GLUCOSE 237*   < > 249* 258* 227*  BUN 16   < > 17 16 17   CREATININE 1.16   < > 1.22 1.10 1.16  CALCIUM 9.0   < > 8.6* 8.3* 8.6*  PROT 7.7  --   --   --   --   ALBUMIN 3.7   < > 2.9* 2.5* 2.9*  AST 36  --   --   --   --   ALT 32  --   --   --   --   ALKPHOS 72  --   --   --   --   BILITOT 0.2*  --   --   --   --   GFRNONAA >60   < > >60 >60 >60  ANIONGAP 12   < > 8 8 10    < > = values in this interval not  displayed.      Hematology Recent Labs  Lab 05/16/21 0319 05/17/21 0325 05/18/21 0244 05/19/21 0257 05/20/21 0310  WBC 5.9 5.5 6.4  --   --   RBC 3.69* 3.62*  3.55* 3.64*  --   --   HGB 9.6* 9.4* 9.5* 10.3* 10.3*  HCT 31.8* 30.7* 31.5* 34.2* 33.5*  MCV 86.2 84.8 86.5  --   --   MCH 26.0 26.0 26.1  --   --   MCHC 30.2 30.6 30.2  --   --   RDW 16.7* 16.7* 17.2*  --   --   PLT 251 247 253  --   --      Cardiac Enzymes Recent Labs  Lab 05/10/21 1025 05/10/21 1635 05/11/21 0804 05/15/21 0430 05/15/21 0600  TROPONINIHS 893* 1,326* 1,359* 176* 223*     BNP Recent Labs  Lab 05/18/21 0244 05/19/21 0257 05/20/21 0310  BNP 108.0* 217.1* 66.7      Radiology    CARDIAC CATHETERIZATION  Result Date: 05/18/2021 Images from the original result were not included.   Prox RCA lesion is 100% stenosed.   Prox Cx to Mid Cx lesion is 60% stenosed.   LPAV lesion is 60% stenosed.   Ost LAD to Prox LAD lesion is 40% stenosed. Andres Richardson is a 55 y.o. male  [id number] LOCATION:  FACILITY: [facility] PHYSICIAN: Nanetta Batty, M.D. 04/30/1966 DATE OF PROCEDURE:  05/18/2021 DATE OF DISCHARGE: CARDIAC CATHETERIZATION History obtained from chart review.  55 year old Caucasian male with history of tobacco abuse,  hypertension, hyperlipidemia and peripheral arterial disease.  He has a known occluded left common iliac artery.  He was recently mated with pneumonia and had positive enzymes.  2D echo was normal.  He was discharged home after refusing diagnostic coronary angiography and awakened with shortness of breath and chest pain and was readmitted.  He presents now for diagnostic coronary angiography.   Andres Richardson  has an occluded nondominant right otherwise nonobstructive CAD and a left dominant system.  He does have moderate left PDA disease.  His LVEDP was 21.  There were no "culprit lesions" requiring intervention.  Medical therapy will be recommended.  The sheath was removed and a TR band was placed on the right wrist to achieve patent hemostasis.  The patient left lab stable condition. Nanetta Batty. MD, [facility] 05/18/2021 2:36 PM     Cardiac Studies   Echocardiogram 05/11/2021:  1. Left ventricular ejection fraction, by estimation, is 50 to 55%. The  left ventricle has low normal function. The left ventricle demonstrates  regional wall motion abnormalities (see scoring diagram/findings for  description). There is moderate left  ventricular hypertrophy. Left ventricular diastolic parameters are  consistent with Grade I diastolic dysfunction (impaired relaxation). There  is moderate hypokinesis of the left ventricular, basal inferior wall and  inferolateral wall.   2. Right ventricular systolic function is normal. The right ventricular  size is normal. There is normal pulmonary artery systolic pressure. The  estimated right ventricular systolic pressure is 31.4 mmHg.   3. The mitral valve is grossly normal. Trivial mitral valve  regurgitation.   4. The aortic valve was not well visualized. Aortic valve regurgitation  is trivial.   5. The inferior vena cava is dilated in size with >50% respiratory  variability, suggesting right atrial pressure of 8 mmHg.  Assessment & Plan    NSTEMI during recent  hospitalization, peak troponin I 1359 on September 26.  Cardiac catheterization was recommended but he declined  at that time.  Presented back to the ED on 9/30 with chest pain and mild troponin elevation.  Now willing to undergo cath.  Echo 9/26 showed EF 50 to 55%, normal RV function, no significant valvular disease.  Cath 10/30 showed occluded nondominant RCA, proximal to mid LCx 60% stenosis, LPA V 60% stenosis, ostial proximal LAD 40% stenosis. -Continue aspirin 81 mg daily, atorvastatin 40 mg daily,Toprol-XL 12.5 mg daily  Acute on chronic diastolic heart failure: LVEDP 21 on cath 10/3.   -Volume status improving, would continue IV Lasix 40 mg twice daily for today, can likely switch to p.o. Lasix tomorrow  Type 2 diabetes mellitus.  Hemoglobin A1c 6.8%.  He is on Glucophage XR, Glucotrol XL, and insulin as an outpatient.  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  Tobacco abuse in remission.  PAD with known aortoiliac disease.  Mixed hyperlipidemia, on Lipitor.  LDL 44.  Recent hospitalization with aspiration pneumonia versus chemical pneumonitis, has completed course of antibiotics.  Afebrile.  No cough.  Chronic anemia normal MCV.  Hemoglobin is stable at 9.5 today.  Suspected OSA: Plan outpatient sleep study     Signed, Little Ishikawa, MD  05/20/2021, 9:05 AM

## 2021-05-20 NOTE — Progress Notes (Signed)
Mobility Specialist Progress Note    05/20/21 1627  Mobility  Activity Ambulated in hall  Level of Assistance Standby assist, set-up cues, supervision of patient - no hands on  Assistive Device None  Distance Ambulated (ft) 430 ft  Mobility Ambulated independently in hallway  Mobility Response Tolerated well  Mobility performed by Mobility specialist  Bed Position Chair  $Mobility charge 1 Mobility   Pre-Mobility: 121/63 BP Post-Mobility: 143/71 BP  Pt received in chair and agreeable. Took x1 standing break d/t leg and hip feeling stiff. Returned to chair with call bell in reach.   [place] Mobility Specialist  Mobility Specialist Phone: [phone]

## 2021-05-21 ENCOUNTER — Ambulatory Visit: Payer: Medicaid Other | Admitting: Student

## 2021-05-21 LAB — GLUCOSE, CAPILLARY
Glucose-Capillary: 176 mg/dL — ABNORMAL HIGH (ref 70–99)
Glucose-Capillary: 184 mg/dL — ABNORMAL HIGH (ref 70–99)
Glucose-Capillary: 325 mg/dL — ABNORMAL HIGH (ref 70–99)
Glucose-Capillary: 100 mg/dL — ABNORMAL HIGH (ref 70–99)

## 2021-05-21 LAB — BASIC METABOLIC PANEL
Anion gap: 10 (ref 5–15)
BUN: 19 mg/dL (ref 6–20)
CO2: 26 mmol/L (ref 22–32)
Calcium: 9 mg/dL (ref 8.9–10.3)
Chloride: 101 mmol/L (ref 98–111)
Creatinine, Ser: 1.2 mg/dL (ref 0.61–1.24)
GFR, Estimated: >60 mL/min (ref >60)
Glucose, Bld: 203 mg/dL — ABNORMAL HIGH (ref 70–99)
Potassium: 4.4 mmol/L (ref 3.5–5.1)
Sodium: 137 mmol/L (ref 135–145)

## 2021-05-21 LAB — MAGNESIUM: Magnesium: 1.7 mg/dL (ref 1.7–2.4)

## 2021-05-21 MED ORDER — FUROSEMIDE 40 MG PO TABS
40.0000 mg | ORAL_TABLET | Freq: Every day | ORAL | Status: DC
  Administered 2021-05-22: 40 mg via ORAL
  Filled 2021-05-21: qty 1

## 2021-05-21 MED ORDER — MAGNESIUM SULFATE 2 GM/50ML IV SOLN
2.0000 g | Freq: Once | INTRAVENOUS | Status: AC
  Administered 2021-05-21: 2 g via INTRAVENOUS
  Filled 2021-05-21: qty 50

## 2021-05-21 NOTE — Progress Notes (Signed)
9 PROGRESS NOTE    CLANCEY WELTON  VFI:[id number] DOB: 09-26-1965 DOA: 05/15/2021 PCP: [name], MD    Chief Complaint  Patient presents with   Shortness of Breath    Brief Narrative:  55 year old M with PMH of IDDM-2, former 80-pack-year history of smoking (quit in 02/2021), Fournier's gangrene s/p debridement and loop sigmoid colostomy in 02/2021, and most recent hospitalization from 9/25-9/29 for acute hypoxemic and hypercapnic RF and sepsis in the setting of aspiration pneumonia and chemical pneumonitis, non-STEMI and DKA returning with acute chest pain and dyspnea about 4 AM while voiding that prompted him to call EMS.    In ED, he was slightly hypertensive.  He was tachypneic to 36 with saturation of 90%.  BNP 405.  Trop 176>> 223.  He was placed on BiPAP, IV Lasix, nitro drip and heparin, and admitted for chest pain with elevated troponin/possible non-STEMI and CHF exacerbation.     LHC on 10/3 showed occluded nondominant RCA, proximal to mid LCx 60% stenosis, LPA V 60% stenosis, ostial proximal LAD 40% stenosis.   On IV Lasix per cardiology.    Assessment & Plan:   Principal Problem:   Acute respiratory failure with hypoxia (HCC) Active Problems:   Type 2 diabetes mellitus without complication, with long-term current use of insulin (HCC)   Elevated troponin   Hyperlipidemia   CAD (coronary artery disease)   Acute diastolic CHF (congestive heart failure) (HCC)   Acute respiratory failure with hypoxia appears to have resolved. Probably secondary to acute on chronic diastolic heart failure Echocardiogram showed preserved left ventricular ejection fraction with right wall motion abnormalities and grade 1 diastolic dysfunction.  Cardiology consulted and plan for IV lasix 40 mg bid. Diuresed about 5 lit in the last 24 hours. Another day of IV lasix and possible transition to oral lasix in am.  Continue with strict intake and output and daily weights Monitor renal parameters  while on Lasix.    Elevated troponin/NSTEMI probably from demand ischemia from CHF.  Cath 10/30 showed occluded nondominant RCA, proximal to mid LCx 60% stenosis, LPA V 60% stenosis, ostial proximal LAD 40% stenosis. Continue with aspirin 81 mg daily, atorvastatin 40 mg daily,Toprol-XL 12.5 mg daily   Recent hospitalization for aspiration pneumonia Completed a course of Augmentin.   Recent fournier's gangrene in July 2022  s/p surgical debridement and ostomy Plan for ostomy reversal in November.   Hypokalemia and hypomagnesemia:  Replaced.   Anemia of chronic disease Continue to monitor.  Peripheral artery disease Recommend outpatient follow up .    Hyperlipidemia Continue with the Lipitor.   Suspected obstructive sleep apnea recommend outpatient follow-up with pulmonology for sleep studies.   Insulin-dependent diabetes mellitus with hyperglycemia Continue with sliding scale insulin and Lantus. Last hemoglobin A1c at 6.8%    DVT prophylaxis: (Lovenox/) Code Status: full code.  Family Communication: family at bedside.  Disposition:   Status is: Inpatient  Remains inpatient appropriate because:IV treatments appropriate due to intensity of illness or inability to take PO  Dispo: The patient is from: Home              Anticipated d/c is to: Home              Patient currently is not medically stable to d/c.   Difficult to place patient No       Consultants:  Cardiology.   Procedures: none.   Antimicrobials: none.    Subjective: No new complaints.   Objective: Vitals:  05/20/21 1942 05/21/21 0625 05/21/21 0625 05/21/21 1235  BP: (!) 117/44 129/63 129/63 134/72  Pulse: 88 92 89 92  Resp:  18 18   Temp: 98.5 F (36.9 C) 98.2 F (36.8 C) 98.2 F (36.8 C)   TempSrc: Oral Oral Oral   SpO2: 96% 100% 100% 100%  Weight:  98 kg    Height:        Intake/Output Summary (Last 24 hours) at 05/21/2021 1435 Last data filed at 05/21/2021 0600 Gross  per 24 hour  Intake 574 ml  Output 2500 ml  Net -1926 ml    Filed Weights   05/19/21 0448 05/20/21 0430 05/21/21 0625  Weight: 102.1 kg 101.2 kg 98 kg    Examination:  General exam: Appears calm and comfortable  Respiratory system: Clear to auscultation. Respiratory effort normal. Cardiovascular system: S1 & S2 heard, RRR. No JVD, . Improving pedal edema.  Gastrointestinal system: Abdomen is nondistended, soft and nontender. . Normal bowel sounds heard. Central nervous system: Alert and oriented. No focal neurological deficits. Extremities: Symmetric 5 x 5 power. Skin: No rashes, lesions or ulcers Psychiatry: Mood & affect appropriate.      Data Reviewed: I have personally reviewed following labs and imaging studies  CBC: Recent Labs  Lab 05/15/21 0430 05/15/21 0541 05/16/21 0319 05/17/21 0325 05/18/21 0244 05/19/21 0257 05/20/21 0310  WBC 12.0*  --  5.9 5.5 6.4  --   --   NEUTROABS 5.6  --   --   --   --   --   --   HGB 11.1*   < > 9.6* 9.4* 9.5* 10.3* 10.3*  HCT 37.4*   < > 31.8* 30.7* 31.5* 34.2* 33.5*  MCV 87.6  --  86.2 84.8 86.5  --   --   PLT 456*  --  251 247 253  --   --    < > = values in this interval not displayed.     Basic Metabolic Panel: Recent Labs  Lab 05/17/21 0325 05/18/21 0244 05/19/21 0257 05/20/21 0310 05/21/21 0809  NA 136 136 136 138 137  K 3.9 3.8 3.7 3.7 4.4  CL 104 103 105 104 101  CO2 22 25 23 24 26   GLUCOSE 309* 249* 258* 227* 203*  BUN 17 17 16 17 19   CREATININE 1.03 1.22 1.10 1.16 1.20  CALCIUM 8.7* 8.6* 8.3* 8.6* 9.0  MG 1.6* 1.7 1.9 1.6* 1.7  PHOS 3.3 3.8 3.9 4.3  --      GFR: Estimated Creatinine Clearance: 83 mL/min (by C-G formula based on SCr of 1.2 mg/dL).  Liver Function Tests: Recent Labs  Lab 05/15/21 0430 05/17/21 0325 05/18/21 0244 05/19/21 0257 05/20/21 0310  AST 36  --   --   --   --   ALT 32  --   --   --   --   ALKPHOS 72  --   --   --   --   BILITOT 0.2*  --   --   --   --   PROT 7.7   --   --   --   --   ALBUMIN 3.7 2.9* 2.9* 2.5* 2.9*     CBG: Recent Labs  Lab 05/20/21 1116 05/20/21 1551 05/20/21 2121 05/21/21 0631 05/21/21 1117  GLUCAP 157* 95 225* 176* 184*      Recent Results (from the past 240 hour(s))  Resp Panel by RT-PCR (Flu A&B, Covid) Nasopharyngeal Swab     Status: None  Collection Time: 05/15/21  5:45 AM   Specimen: Nasopharyngeal Swab; Nasopharyngeal(NP) swabs in vial transport medium  Result Value Ref Range Status   SARS Coronavirus 2 by RT PCR NEGATIVE NEGATIVE Final    Comment: (NOTE) SARS-CoV-2 target nucleic acids are NOT DETECTED.  The SARS-CoV-2 RNA is generally detectable in upper respiratory specimens during the acute phase of infection. The lowest concentration of SARS-CoV-2 viral copies this assay can detect is 138 copies/mL. A negative result does not preclude SARS-Cov-2 infection and should not be used as the sole basis for treatment or other patient management decisions. A negative result may occur with  improper specimen collection/handling, submission of specimen other than nasopharyngeal swab, presence of viral mutation(s) within the areas targeted by this assay, and inadequate number of viral copies(<138 copies/mL). A negative result must be combined with clinical observations, patient history, and epidemiological information. The expected result is Negative.  Fact Sheet for Patients:  BloggerCourse.com  Fact Sheet for Healthcare Providers:  SeriousBroker.it  This test is no t yet approved or cleared by the [place] FDA and  has been authorized for detection and/or diagnosis of SARS-CoV-2 by FDA under an Emergency Use Authorization (EUA). This EUA will remain  in effect (meaning this test can be used) for the duration of the COVID-19 declaration under Section 564(b)(1) of the Act, 21 U.S.C.section 360bbb-3(b)(1), unless the authorization is terminated  or  revoked sooner.       Influenza A by PCR NEGATIVE NEGATIVE Final   Influenza B by PCR NEGATIVE NEGATIVE Final    Comment: (NOTE) The Xpert Xpress SARS-CoV-2/FLU/RSV plus assay is intended as an aid in the diagnosis of influenza from Nasopharyngeal swab specimens and should not be used as a sole basis for treatment. Nasal washings and aspirates are unacceptable for Xpert Xpress SARS-CoV-2/FLU/RSV testing.  Fact Sheet for Patients: BloggerCourse.com  Fact Sheet for Healthcare Providers: SeriousBroker.it  This test is not yet approved or cleared by the [place] FDA and has been authorized for detection and/or diagnosis of SARS-CoV-2 by FDA under an Emergency Use Authorization (EUA). This EUA will remain in effect (meaning this test can be used) for the duration of the COVID-19 declaration under Section 564(b)(1) of the Act, 21 U.S.C. section 360bbb-3(b)(1), unless the authorization is terminated or revoked.  Performed at [facility], [address], [place], [place] [address]           Radiology Studies: No results found.      Scheduled Meds:  aspirin  81 mg Oral Daily   atorvastatin  40 mg Oral Daily   enoxaparin (LOVENOX) injection  40 mg Subcutaneous Q24H   [START ON 05/22/2021] furosemide  40 mg Oral Daily   insulin aspart  0-20 Units Subcutaneous TID WC   insulin aspart  0-5 Units Subcutaneous QHS   insulin aspart  6 Units Subcutaneous TID WC   insulin glargine-yfgn  35 Units Subcutaneous Daily   metoprolol succinate  12.5 mg Oral Daily   sodium chloride flush  3 mL Intravenous Q12H   Continuous Infusions:  sodium chloride       LOS: 6 days        Kathlen Mody, MD [facility]   To contact the attending provider between 7A-7P or the covering provider during after hours 7P-7A, please log into the web site [email or web address] and access using universal [place] password  for that web site. If you do not have the password, please call the hospital operator.  05/21/2021, 2:35  PM

## 2021-05-21 NOTE — Progress Notes (Signed)
Mobility Specialist Progress Note   05/21/21 0900  Mobility  Activity Ambulated in hall  Level of Assistance Independent  Assistive Device None  Distance Ambulated (ft) 520 ft  Mobility Ambulated independently in hallway  Mobility Response Tolerated well  Mobility performed by Mobility specialist  Bed Position Chair  $Mobility charge 1 Mobility   Pt received in chair denying any symptoms and agreeable to mobility session. Asymptomatic  throughout ambulation, returned back to chair w/ call bell by side and needs met.   Pre Mobility: 93 HR During Mobility: 108 HR Post Mobility: 86 HR  Tacey Heap Phone Number [phone]

## 2021-05-21 NOTE — Progress Notes (Signed)
Progress Note  Patient Name: Andres Richardson Date of Encounter: 05/21/2021  Primary Cardiologist: Parke Poisson, MD  Subjective   Net -3.8 L yesterday on IV Lasix 40 mg twice daily.  -12 L on admission.  No labs this morning.  Reports dyspnea improved.  Inpatient Medications    Scheduled Meds:  aspirin  81 mg Oral Daily   atorvastatin  40 mg Oral Daily   enoxaparin (LOVENOX) injection  40 mg Subcutaneous Q24H   furosemide  40 mg Intravenous Q12H   insulin aspart  0-20 Units Subcutaneous TID WC   insulin aspart  0-5 Units Subcutaneous QHS   insulin aspart  6 Units Subcutaneous TID WC   insulin glargine-yfgn  35 Units Subcutaneous Daily   metoprolol succinate  12.5 mg Oral Daily   sodium chloride flush  3 mL Intravenous Q12H   Continuous Infusions:  sodium chloride     magnesium sulfate bolus IVPB     PRN Meds: sodium chloride, acetaminophen **OR** acetaminophen, acetaminophen, metoprolol tartrate, morphine injection, ondansetron **OR** ondansetron (ZOFRAN) IV, sodium chloride flush   Vital Signs    Vitals:   05/20/21 1145 05/20/21 1942 05/21/21 0625 05/21/21 0625  BP: 97/66 (!) 117/44 129/63 129/63  Pulse: 88 88 92 89  Resp:   18 18  Temp: 98.8 F (37.1 C) 98.5 F (36.9 C) 98.2 F (36.8 C) 98.2 F (36.8 C)  TempSrc: Oral Oral Oral Oral  SpO2: 100% 96% 100% 100%  Weight:   98 kg   Height:        Intake/Output Summary (Last 24 hours) at 05/21/2021 0833 Last data filed at 05/21/2021 0600 Gross per 24 hour  Intake 814 ml  Output 5125 ml  Net -4311 ml    Filed Weights   05/19/21 0448 05/20/21 0430 05/21/21 0625  Weight: 102.1 kg 101.2 kg 98 kg    Telemetry    Sinus rhythm with rate 80s to 100s.  Personally reviewed.  ECG    No new ECG  Physical Exam   GEN: No acute distress.   Neck: No JVD. Cardiac: RRR without gallop or rub.  Respiratory: Nonlabored.  Clear to auscultation. GI: Soft, nontender, bowel sounds present. MS: Trace pitting  edema. Neuro:  Nonfocal. Psych: Alert and oriented x 3. Normal affect.  Labs    Chemistry Recent Labs  Lab 05/15/21 0430 05/15/21 0541 05/18/21 0244 05/19/21 0257 05/20/21 0310  NA 142   < > 136 136 138  K 4.8   < > 3.8 3.7 3.7  CL 110   < > 103 105 104  CO2 20*   < > 25 23 24   GLUCOSE 237*   < > 249* 258* 227*  BUN 16   < > 17 16 17   CREATININE 1.16   < > 1.22 1.10 1.16  CALCIUM 9.0   < > 8.6* 8.3* 8.6*  PROT 7.7  --   --   --   --   ALBUMIN 3.7   < > 2.9* 2.5* 2.9*  AST 36  --   --   --   --   ALT 32  --   --   --   --   ALKPHOS 72  --   --   --   --   BILITOT 0.2*  --   --   --   --   GFRNONAA >60   < > >60 >60 >60  ANIONGAP 12   < > 8 8 10    < > =  values in this interval not displayed.      Hematology Recent Labs  Lab 05/16/21 0319 05/17/21 0325 05/18/21 0244 05/19/21 0257 05/20/21 0310  WBC 5.9 5.5 6.4  --   --   RBC 3.69* 3.62*  3.55* 3.64*  --   --   HGB 9.6* 9.4* 9.5* 10.3* 10.3*  HCT 31.8* 30.7* 31.5* 34.2* 33.5*  MCV 86.2 84.8 86.5  --   --   MCH 26.0 26.0 26.1  --   --   MCHC 30.2 30.6 30.2  --   --   RDW 16.7* 16.7* 17.2*  --   --   PLT 251 247 253  --   --      Cardiac Enzymes Recent Labs  Lab 05/10/21 1025 05/10/21 1635 05/11/21 0804 05/15/21 0430 05/15/21 0600  TROPONINIHS 893* 1,326* 1,359* 176* 223*     BNP Recent Labs  Lab 05/18/21 0244 05/19/21 0257 05/20/21 0310  BNP 108.0* 217.1* 66.7      Radiology    No results found.  Cardiac Studies   Echocardiogram 05/11/2021:  1. Left ventricular ejection fraction, by estimation, is 50 to 55%. The  left ventricle has low normal function. The left ventricle demonstrates  regional wall motion abnormalities (see scoring diagram/findings for  description). There is moderate left  ventricular hypertrophy. Left ventricular diastolic parameters are  consistent with Grade I diastolic dysfunction (impaired relaxation). There  is moderate hypokinesis of the left ventricular,  basal inferior wall and  inferolateral wall.   2. Right ventricular systolic function is normal. The right ventricular  size is normal. There is normal pulmonary artery systolic pressure. The  estimated right ventricular systolic pressure is 31.4 mmHg.   3. The mitral valve is grossly normal. Trivial mitral valve  regurgitation.   4. The aortic valve was not well visualized. Aortic valve regurgitation  is trivial.   5. The inferior vena cava is dilated in size with >50% respiratory  variability, suggesting right atrial pressure of 8 mmHg.  Assessment & Plan    NSTEMI during recent hospitalization, peak troponin I 1359 on September 26.  Cardiac catheterization was recommended but he declined at that time.  Presented back to the ED on 9/30 with chest pain and mild troponin elevation.  Now willing to undergo cath.  Echo 9/26 showed EF 50 to 55%, normal RV function, no significant valvular disease.  Cath 10/30 showed occluded nondominant RCA, proximal to mid LCx 60% stenosis, LPA V 60% stenosis, ostial proximal LAD 40% stenosis. -Continue aspirin 81 mg daily, atorvastatin 40 mg daily,Toprol-XL 12.5 mg daily  Acute on chronic diastolic heart failure: LVEDP 21 on cath 10/3.   -Volume status improved, appears euvolemic.  Received IV lasix this morning, will plan to transition to PO lasix tomorrow.  No check BMET, magnesium  Type 2 diabetes mellitus.  Hemoglobin A1c 6.8%.  He is on Glucophage XR, Glucotrol XL, and insulin as an outpatient.  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  Tobacco abuse in remission.  PAD with known aortoiliac disease.  Mixed hyperlipidemia, on Lipitor.  LDL 44.  Recent hospitalization with aspiration pneumonia versus chemical pneumonitis, has completed course of antibiotics.  Afebrile.  No cough.  Chronic anemia normal MCV.  Hemoglobin is stable at 9.5 today.  Suspected OSA: Plan outpatient sleep study     Signed, Little Ishikawa, MD  05/21/2021, 8:33 AM

## 2021-05-22 LAB — BASIC METABOLIC PANEL
Anion gap: 9 (ref 5–15)
BUN: 23 mg/dL — ABNORMAL HIGH (ref 6–20)
CO2: 26 mmol/L (ref 22–32)
Calcium: 8.5 mg/dL — ABNORMAL LOW (ref 8.9–10.3)
Chloride: 100 mmol/L (ref 98–111)
Creatinine, Ser: 1.14 mg/dL (ref 0.61–1.24)
GFR, Estimated: >60 mL/min (ref >60)
Glucose, Bld: 304 mg/dL — ABNORMAL HIGH (ref 70–99)
Potassium: 3.9 mmol/L (ref 3.5–5.1)
Sodium: 135 mmol/L (ref 135–145)

## 2021-05-22 LAB — GLUCOSE, CAPILLARY: Glucose-Capillary: 275 mg/dL — ABNORMAL HIGH (ref 70–99)

## 2021-05-22 MED ORDER — METOPROLOL SUCCINATE ER 25 MG PO TB24
25.0000 mg | ORAL_TABLET | Freq: Every day | ORAL | Status: DC
  Administered 2021-05-22: 25 mg via ORAL
  Filled 2021-05-22: qty 1

## 2021-05-22 MED ORDER — FUROSEMIDE 40 MG PO TABS: 40.0000 mg | ORAL_TABLET | Freq: Every day | ORAL | 3 refills | Status: DC

## 2021-05-22 NOTE — Progress Notes (Signed)
Discharge orders received.  IVs and telemetry removed, CCMD notified.  Pt education provided, no questions at this time.  Pt friend at bedside for transport to home.

## 2021-05-22 NOTE — Progress Notes (Signed)
Progress Note  Patient Name: Andres Richardson Date of Encounter: 05/22/2021  Primary Cardiologist: Parke Poisson, MD  Subjective   Net -2.2 L yesterday on IV Lasix 40 mg x1.  -14 L on admission.  Creatinine stable at 1.1.  Denies any chest pain or dyspnea.  Inpatient Medications    Scheduled Meds:  aspirin  81 mg Oral Daily   atorvastatin  40 mg Oral Daily   enoxaparin (LOVENOX) injection  40 mg Subcutaneous Q24H   furosemide  40 mg Oral Daily   insulin aspart  0-20 Units Subcutaneous TID WC   insulin aspart  0-5 Units Subcutaneous QHS   insulin aspart  6 Units Subcutaneous TID WC   insulin glargine-yfgn  35 Units Subcutaneous Daily   metoprolol succinate  12.5 mg Oral Daily   sodium chloride flush  3 mL Intravenous Q12H   Continuous Infusions:  sodium chloride     PRN Meds: sodium chloride, acetaminophen **OR** acetaminophen, acetaminophen, metoprolol tartrate, morphine injection, ondansetron **OR** ondansetron (ZOFRAN) IV, sodium chloride flush   Vital Signs    Vitals:   05/21/21 1235 05/21/21 1954 05/22/21 0100 05/22/21 0638  BP: 134/72 116/68  (!) 143/59  Pulse: 92 89  92  Resp:  18    Temp:  98.7 F (37.1 C)  98.1 F (36.7 C)  TempSrc:  Oral  Oral  SpO2: 100% 100%  98%  Weight:   99.4 kg   Height:        Intake/Output Summary (Last 24 hours) at 05/22/2021 0817 Last data filed at 05/22/2021 0500 Gross per 24 hour  Intake 220 ml  Output 2500 ml  Net -2280 ml    Filed Weights   05/20/21 0430 05/21/21 0625 05/22/21 0100  Weight: 101.2 kg 98 kg 99.4 kg    Telemetry    Sinus rhythm with rate 80s to 90s.  Personally reviewed.  ECG    No new ECG  Physical Exam   GEN: No acute distress.   Neck: No JVD. Cardiac: RRR without gallop or rub.  Respiratory: Nonlabored.  Clear to auscultation. GI: Soft, nontender, bowel sounds present. MS: Trace pitting edema. Neuro:  Nonfocal. Psych: Alert and oriented x 3. Normal affect.  Labs     Chemistry Recent Labs  Lab 05/18/21 0244 05/19/21 0257 05/20/21 0310 05/21/21 0809 05/22/21 0310  NA 136 136 138 137 135  K 3.8 3.7 3.7 4.4 3.9  CL 103 105 104 101 100  CO2 25 23 24 26 26   GLUCOSE 249* 258* 227* 203* 304*  BUN 17 16 17 19  23*  CREATININE 1.22 1.10 1.16 1.20 1.14  CALCIUM 8.6* 8.3* 8.6* 9.0 8.5*  ALBUMIN 2.9* 2.5* 2.9*  --   --   GFRNONAA >60 >60 >60 >60 >60  ANIONGAP 8 8 10 10 9       Hematology Recent Labs  Lab 05/16/21 0319 05/17/21 0325 05/18/21 0244 05/19/21 0257 05/20/21 0310  WBC 5.9 5.5 6.4  --   --   RBC 3.69* 3.62*  3.55* 3.64*  --   --   HGB 9.6* 9.4* 9.5* 10.3* 10.3*  HCT 31.8* 30.7* 31.5* 34.2* 33.5*  MCV 86.2 84.8 86.5  --   --   MCH 26.0 26.0 26.1  --   --   MCHC 30.2 30.6 30.2  --   --   RDW 16.7* 16.7* 17.2*  --   --   PLT 251 247 253  --   --  Cardiac Enzymes Recent Labs  Lab 05/10/21 1025 05/10/21 1635 05/11/21 0804 05/15/21 0430 05/15/21 0600  TROPONINIHS 893* 1,326* 1,359* 176* 223*     BNP Recent Labs  Lab 05/18/21 0244 05/19/21 0257 05/20/21 0310  BNP 108.0* 217.1* 66.7      Radiology    No results found.  Cardiac Studies   Echocardiogram 05/11/2021:  1. Left ventricular ejection fraction, by estimation, is 50 to 55%. The  left ventricle has low normal function. The left ventricle demonstrates  regional wall motion abnormalities (see scoring diagram/findings for  description). There is moderate left  ventricular hypertrophy. Left ventricular diastolic parameters are  consistent with Grade I diastolic dysfunction (impaired relaxation). There  is moderate hypokinesis of the left ventricular, basal inferior wall and  inferolateral wall.   2. Right ventricular systolic function is normal. The right ventricular  size is normal. There is normal pulmonary artery systolic pressure. The  estimated right ventricular systolic pressure is 31.4 mmHg.   3. The mitral valve is grossly normal. Trivial mitral  valve  regurgitation.   4. The aortic valve was not well visualized. Aortic valve regurgitation  is trivial.   5. The inferior vena cava is dilated in size with >50% respiratory  variability, suggesting right atrial pressure of 8 mmHg.  Assessment & Plan    NSTEMI during recent hospitalization, peak troponin I 1359 on September 26.  Cardiac catheterization was recommended but he declined at that time.  Presented back to the ED on 9/30 with chest pain and mild troponin elevation.  Now willing to undergo cath.  Echo 9/26 showed EF 50 to 55%, normal RV function, no significant valvular disease.  Cath 10/30 showed occluded nondominant RCA, proximal to mid LCx 60% stenosis, LPA V 60% stenosis, ostial proximal LAD 40% stenosis. -Continue aspirin 81 mg daily, atorvastatin 40 mg daily,Toprol-XL 25 mg daily  Acute on chronic diastolic heart failure: LVEDP 21 on cath 10/3.  Has diuresed well with IV Lasix, net -14 L on admission -Transition to p.o. Lasix 40 mg daily today  Type 2 diabetes mellitus.  Hemoglobin A1c 6.8%.  He is on Glucophage XR, Glucotrol XL, and insulin as an outpatient.  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  Tobacco abuse in remission.  PAD with known aortoiliac disease.  Mixed hyperlipidemia, on Lipitor.  LDL 44.  Recent hospitalization with aspiration pneumonia versus chemical pneumonitis, has completed course of antibiotics.  Afebrile.  No cough.  Chronic anemia normal MCV.  Hemoglobin is stable   Suspected OSA: Plan outpatient sleep study  [facility] will sign off.   Medication Recommendations: Aspirin 81 mg daily, atorvastatin 40 mg daily, Toprol-XL 25 mg daily, Lasix 40 mg daily Other recommendations (labs, testing, etc):  BMET within 1 week Follow up as an outpatient:  Will schedule   Signed, Little Ishikawa, MD  05/22/2021, 8:17 AM

## 2021-05-25 NOTE — Discharge Summary (Signed)
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 (M-mode): 1.9 cm LEFT ATRIUM             Index       RIGHT ATRIUM           Index LA diam:        3.00 cm 1.37 cm/m  RA Area:     16.00 cm LA Vol (A2C):   50.1 ml 22.89 ml/m RA Volume:   45.00 ml  20.56 ml/m LA Vol (A4C):   49.9 ml 22.80 ml/m LA Biplane Vol: 53.7 ml 24.54 ml/m  AORTIC VALVE LVOT Vmax:   92.20 cm/s LVOT Vmean:  63.500 cm/s LVOT VTI:    0.178 m  AORTA Ao Root diam: 3.80 cm MITRAL VALVE  TRICUSPID VALVE MV Area (PHT): 4.31 cm    TR Peak grad:   23.4 mmHg MV Decel Time: 176 msec    TR Vmax:        242.00 cm/s MV E velocity: 85.80 cm/s MV A velocity: 97.15 cm/s  SHUNTS MV E/A ratio:  0.88        Systemic VTI:  0.18 m                            Systemic Diam: 2.60 cm Zoila Shutter MD Electronically signed by Zoila Shutter MD Signature Date/Time: 05/11/2021/2:58:03 PM    Final    VAS [name] UPPER EXTREMITY VENOUS DUPLEX  Result Date: 05/13/2021 UPPER VENOUS STUDY  Patient Name:  BRYN PERKIN  Date of Exam:   05/13/2021 Medical Rec #: [id number]        Accession #:    [id number] Date of Birth: 1965-09-25        Patient Gender: M Patient Age:   36 years Exam Location:  [facility] Procedure:      VAS [name] UPPER EXTREMITY  VENOUS DUPLEX Referring Phys: Pamella Pert --------------------------------------------------------------------------------  Indications: Pain, and Swelling Risk Factors: Previous IV and blood draw in LUE. Comparison Study: No previous exams Performing Technologist: Jody Hill RVT, RDMS  Examination Guidelines: A complete evaluation includes B-mode imaging, spectral Doppler, color Doppler, and power Doppler as needed of all accessible portions of each vessel. Bilateral testing is considered an integral part of a complete examination. Limited examinations for reoccurring indications may be performed as noted.  Right Findings: +----------+------------+---------+-----------+----------+-------+ RIGHT     CompressiblePhasicitySpontaneousPropertiesSummary +----------+------------+---------+-----------+----------+-------+ Subclavian    Full       Yes       Yes                      +----------+------------+---------+-----------+----------+-------+  Left Findings: +----------+------------+---------+-----------+----------+-------+ LEFT      CompressiblePhasicitySpontaneousPropertiesSummary +----------+------------+---------+-----------+----------+-------+ IJV           Full       Yes       Yes                      +----------+------------+---------+-----------+----------+-------+ Subclavian    Full                                          +----------+------------+---------+-----------+----------+-------+ Axillary      Full       Yes       Yes                      +----------+------------+---------+-----------+----------+-------+ Brachial      Full       Yes       Yes                      +----------+------------+---------+-----------+----------+-------+ Radial        Full       Yes       Yes                      +----------+------------+---------+-----------+----------+-------+ Ulnar         Full                                           +----------+------------+---------+-----------+----------+-------+  Cephalic      Full                                          +----------+------------+---------+-----------+----------+-------+ Basilic       Full       Yes       Yes                      +----------+------------+---------+-----------+----------+-------+ SVT(other) in LUE mid and distal forearm.  Summary:  Right: No evidence of thrombosis in the subclavian.  Left: No evidence of deep vein thrombosis in the upper extremity. Acute SVT(other) in LUE mid and distal forearm.  *See table(s) above for measurements and observations.  Diagnosing physician: Heath Lark Electronically signed by Heath Lark on 05/13/2021 at 5:27:29 PM.    Final      Subjective: No new complaints.   Discharge Exam: Vitals:   05/21/21 1954 05/22/21 0638  BP: 116/68 (!) 143/59  Pulse: 89 92  Resp: 18   Temp: 98.7 F (37.1 C) 98.1 F (36.7 C)  SpO2: 100% 98%   Vitals:   05/21/21 1235 05/21/21 1954 05/22/21 0100 05/22/21 0638  BP: 134/72 116/68  (!) 143/59  Pulse: 92 89  92  Resp:  18    Temp:  98.7 F (37.1 C)  98.1 F (36.7 C)  TempSrc:  Oral  Oral  SpO2: 100% 100%  98%  Weight:   99.4 kg   Height:        General: Pt is alert, awake, not in acute distress Cardiovascular: RRR, S1/S2 +, no rubs, no gallops Respiratory: CTA bilaterally, no wheezing, no rhonchi Abdominal: Soft, NT, ND, bowel sounds + Extremities: no edema, no cyanosis    The results of significant diagnostics from this hospitalization (including imaging, microbiology, ancillary and laboratory) are listed below for reference.     Microbiology: No results found for this or any previous visit (from the past 240 hour(s)).   Labs: BNP (last 3 results) Recent Labs    05/18/21 0244 05/19/21 0257 05/20/21 0310  BNP 108.0* 217.1* 66.7   Basic Metabolic Panel: Recent Labs  Lab 05/19/21 0257 05/20/21 0310 05/21/21 0809 05/22/21 0310  NA 136 138 137 135   K 3.7 3.7 4.4 3.9  CL 105 104 101 100  CO2 23 24 26 26   GLUCOSE 258* 227* 203* 304*  BUN 16 17 19  23*  CREATININE 1.10 1.16 1.20 1.14  CALCIUM 8.3* 8.6* 9.0 8.5*  MG 1.9 1.6* 1.7  --   PHOS 3.9 4.3  --   --    Liver Function Tests: Recent Labs  Lab 05/19/21 0257 05/20/21 0310  ALBUMIN 2.5* 2.9*   No results for input(s): LIPASE, AMYLASE in the last 168 hours. No results for input(s): AMMONIA in the last 168 hours. CBC: Recent Labs  Lab 05/19/21 0257 05/20/21 0310  HGB 10.3* 10.3*  HCT 34.2* 33.5*   Cardiac Enzymes: No results for input(s): CKTOTAL, CKMB, CKMBINDEX, TROPONINI in the last 168 hours. BNP: Invalid input(s): POCBNP CBG: Recent Labs  Lab 05/21/21 0631 05/21/21 1117 05/21/21 1638 05/21/21 2121 05/22/21 0636  GLUCAP 176* 184* 325* 100* 275*   D-Dimer No results for input(s): DDIMER in the last 72 hours. Hgb A1c No results for input(s): HGBA1C in the last 72 hours. Lipid Profile No results for input(s): CHOL, HDL, LDLCALC, TRIG, CHOLHDL, LDLDIRECT in the  last 72 hours. Thyroid function studies No results for input(s): TSH, T4TOTAL, T3FREE, THYROIDAB in the last 72 hours.  Invalid input(s): FREET3 Anemia work up No results for input(s): VITAMINB12, FOLATE, FERRITIN, TIBC, IRON, RETICCTPCT in the last 72 hours. Urinalysis    Component Value Date/Time   COLORURINE COLORLESS (A) 05/10/2021 0808   APPEARANCEUR CLEAR 05/10/2021 0808   LABSPEC 1.010 05/10/2021 0808   PHURINE 6.0 05/10/2021 0808   GLUCOSEU 500 (A) 05/10/2021 0808   HGBUR NEGATIVE 05/10/2021 0808   BILIRUBINUR NEGATIVE 05/10/2021 0808   KETONESUR NEGATIVE 05/10/2021 0808   PROTEINUR 100 (A) 05/10/2021 0808   NITRITE NEGATIVE 05/10/2021 0808   LEUKOCYTESUR NEGATIVE 05/10/2021 0808   Sepsis Labs Invalid input(s): PROCALCITONIN,  WBC,  LACTICIDVEN Microbiology No results found for this or any previous visit (from the past 240 hour(s)).   Time coordinating discharge: 38 minutes.    SIGNED:   Kathlen Mody, MD  [facility]

## 2021-05-29 ENCOUNTER — Telehealth: Payer: Self-pay

## 2021-05-29 NOTE — Telephone Encounter (Signed)
Called patient to discuss the [employer] Lpa trial with no answer. Voice message was left asking for a return call.

## 2021-06-12 ENCOUNTER — Ambulatory Visit: Payer: Self-pay | Admitting: General Surgery

## 2021-06-12 DIAGNOSIS — Z433 Encounter for attention to colostomy: Secondary | ICD-10-CM

## 2021-06-17 ENCOUNTER — Ambulatory Visit (INDEPENDENT_AMBULATORY_CARE_PROVIDER_SITE_OTHER): Payer: Self-pay | Admitting: Medical

## 2021-06-17 ENCOUNTER — Other Ambulatory Visit: Payer: Self-pay

## 2021-06-17 ENCOUNTER — Encounter: Payer: Self-pay | Admitting: Medical

## 2021-06-17 VITALS — BP 161/77 | HR 92 | Ht 71.0 in | Wt 228.0 lb

## 2021-06-17 DIAGNOSIS — I10 Essential (primary) hypertension: Secondary | ICD-10-CM

## 2021-06-17 DIAGNOSIS — R29818 Other symptoms and signs involving the nervous system: Secondary | ICD-10-CM

## 2021-06-17 DIAGNOSIS — E785 Hyperlipidemia, unspecified: Secondary | ICD-10-CM

## 2021-06-17 DIAGNOSIS — I25119 Atherosclerotic heart disease of native coronary artery with unspecified angina pectoris: Secondary | ICD-10-CM

## 2021-06-17 DIAGNOSIS — I739 Peripheral vascular disease, unspecified: Secondary | ICD-10-CM

## 2021-06-17 DIAGNOSIS — Z720 Tobacco use: Secondary | ICD-10-CM

## 2021-06-17 DIAGNOSIS — E119 Type 2 diabetes mellitus without complications: Secondary | ICD-10-CM

## 2021-06-17 MED ORDER — NITROGLYCERIN 0.4 MG SL SUBL: 0.4000 mg | SUBLINGUAL_TABLET | SUBLINGUAL | 2 refills | Status: DC | PRN

## 2021-06-17 MED ORDER — ISOSORBIDE MONONITRATE ER 30 MG PO TB24: 30.0000 mg | ORAL_TABLET | Freq: Every day | ORAL | 1 refills | Status: DC
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
## 2021-06-17 NOTE — Patient Instructions (Addendum)
Medication Instructions:  START Imdur 30 mg daily TAKE Atorvastatin (Lipitor) 40 mg daily  INCREASE Lasix 40 mg 2 times a day for 3 days then resume 40 mg daily going forward  TAKE Nitroglycerin 0.4 mg as needed. Place 1 tablet (0.4 mg total) under the tongue every 5 (five) minutes as needed for chest pain. DO NOT USE more than 3 tablets in an episode. If after the 3rd tablet and pain has not gone away CALL 911  *If you need a refill on your cardiac medications before your next appointment, please call your pharmacy*  Lab Work: Your physician recommends that you return for lab work TODAY: a BMET CBC If you have labs (blood work) drawn today and your tests are completely normal, you will receive your results only by: MyChart Message (if you have MyChart) OR A paper copy in the mail If you have any lab test that is abnormal or we need to change your treatment, we will call you to review the results.  Testing/Procedures: NONE ordered at this time of appointment   Follow-Up: At [facility], you and your health needs are our priority.  As part of our continuing mission to provide you with exceptional heart care, we have created designated Provider Care Teams.  These Care Teams include your primary Cardiologist (physician) and Advanced Practice Providers (APPs -  Physician Assistants and Nurse Practitioners) who all work together to provide you with the care you need, when you need it.  We recommend signing up for the patient portal called "MyChart".  Sign up information is provided on this After Visit Summary.  MyChart is used to connect with patients for Virtual Visits (Telemedicine).  Patients are able to view lab/test results, encounter notes, upcoming appointments, etc.  Non-urgent messages can be sent to your provider as well.   To learn more about what you can do with MyChart, go to [email or web address].    Your next appointment:   2-4 week(s)  The format for your next appointment:    In Person  Provider:   APP  Other Instructions You have been referred to [facility] Monitor blood pressure at home and bring log with you to next appointment       Low-Sodium Eating Plan Sodium, which is an element that makes up salt, helps you maintain a healthy balance of fluids in your body. Too much sodium can increase your blood pressure and cause fluid and waste to be held in your body. Your health care provider or dietitian may recommend following this plan if you have high blood pressure (hypertension), kidney disease, liver disease, or heart failure. Eating less sodium can help lower your blood pressure, reduce swelling, and protect your heart, liver, and kidneys. What are tips for following this plan? Reading food labels The Nutrition Facts label lists the amount of sodium in one serving of the food. If you eat more than one serving, you must multiply the listed amount of sodium by the number of servings. Choose foods with less than 140 mg of sodium per serving. Avoid foods with 300 mg of sodium or more per serving. Shopping  Look for lower-sodium products, often labeled as "low-sodium" or "no salt added." Always check the sodium content, even if foods are labeled as "unsalted" or "no salt added." Buy fresh foods. Avoid canned foods and pre-made or frozen meals. Avoid canned, cured, or processed meats. Buy breads that have less than 80 mg of sodium per slice. Cooking  Eat more home-cooked  food and less restaurant, buffet, and fast food. Avoid adding salt when cooking. Use salt-free seasonings or herbs instead of table salt or sea salt. Check with your health care provider or pharmacist before using salt substitutes. Cook with plant-based oils, such as canola, sunflower, or olive oil. Meal planning When eating at a restaurant, ask that your food be prepared with less salt or no salt, if possible. Avoid dishes labeled as brined, pickled, cured, smoked, or made  with soy sauce, miso, or teriyaki sauce. Avoid foods that contain MSG (monosodium glutamate). MSG is sometimes added to [place] food, bouillon, and some canned foods. Make meals that can be grilled, baked, poached, roasted, or steamed. These are generally made with less sodium. General information Most people on this plan should limit their sodium intake to 1,500-2,000 mg (milligrams) of sodium each day. What foods should I eat? Fruits Fresh, frozen, or canned fruit. Fruit juice. Vegetables Fresh or frozen vegetables. "No salt added" canned vegetables. "No salt added" tomato sauce and paste. Low-sodium or reduced-sodium tomato and vegetable juice. Grains Low-sodium cereals, including oats, puffed wheat and rice, and shredded wheat. Low-sodium crackers. Unsalted rice. Unsalted pasta. Low-sodium bread. Whole-grain breads and whole-grain pasta. Meats and other proteins Fresh or frozen (no salt added) meat, poultry, seafood, and fish. Low-sodium canned tuna and salmon. Unsalted nuts. Dried peas, beans, and lentils without added salt. Unsalted canned beans. Eggs. Unsalted nut butters. Dairy Milk. Soy milk. Cheese that is naturally low in sodium, such as ricotta cheese, fresh mozzarella, or Swiss cheese. Low-sodium or reduced-sodium cheese. Cream cheese. Yogurt. Seasonings and condiments Fresh and dried herbs and spices. Salt-free seasonings. Low-sodium mustard and ketchup. Sodium-free salad dressing. Sodium-free light mayonnaise. Fresh or refrigerated horseradish. Lemon juice. Vinegar. Other foods Homemade, reduced-sodium, or low-sodium soups. Unsalted popcorn and pretzels. Low-salt or salt-free chips. The items listed above may not be a complete list of foods and beverages you can eat. Contact a dietitian for more information. What foods should I avoid? Vegetables Sauerkraut, pickled vegetables, and relishes. Olives. [place] fries. Onion rings. Regular canned vegetables (not low-sodium or  reduced-sodium). Regular canned tomato sauce and paste (not low-sodium or reduced-sodium). Regular tomato and vegetable juice (not low-sodium or reduced-sodium). Frozen vegetables in sauces. Grains Instant hot cereals. Bread stuffing, pancake, and biscuit mixes. Croutons. Seasoned rice or pasta mixes. Noodle soup cups. Boxed or frozen macaroni and cheese. Regular salted crackers. Self-rising flour. Meats and other proteins Meat or fish that is salted, canned, smoked, spiced, or pickled. Precooked or cured meat, such as sausages or meat loaves. Tomasa Blase. Ham. Pepperoni. Hot dogs. Corned beef. Chipped beef. Salt pork. Jerky. Pickled herring. Anchovies and sardines. Regular canned tuna. Salted nuts. Dairy Processed cheese and cheese spreads. Hard cheeses. Cheese curds. Blue cheese. Feta cheese. String cheese. Regular cottage cheese. Buttermilk. Canned milk. Fats and oils Salted butter. Regular margarine. Ghee. Bacon fat. Seasonings and condiments Onion salt, garlic salt, seasoned salt, table salt, and sea salt. Canned and packaged gravies. Worcestershire sauce. Tartar sauce. Barbecue sauce. Teriyaki sauce. Soy sauce, including reduced-sodium. Steak sauce. Fish sauce. Oyster sauce. Cocktail sauce. Horseradish that you find on the shelf. Regular ketchup and mustard. Meat flavorings and tenderizers. Bouillon cubes. Hot sauce. Pre-made or packaged marinades. Pre-made or packaged taco seasonings. Relishes. Regular salad dressings. Salsa. Other foods Salted popcorn and pretzels. Corn chips and puffs. Potato and tortilla chips. Canned or dried soups. Pizza. Frozen entrees and pot pies. The items listed above may not be a complete list of foods and  beverages you should avoid. Contact a dietitian for more information. Summary Eating less sodium can help lower your blood pressure, reduce swelling, and protect your heart, liver, and kidneys. Most people on this plan should limit their sodium intake to 1,500-2,000 mg  (milligrams) of sodium each day. Canned, boxed, and frozen foods are high in sodium. Restaurant foods, fast foods, and pizza are also very high in sodium. You also get sodium by adding salt to food. Try to cook at home, eat more fresh fruits and vegetables, and eat less fast food and canned, processed, or prepared foods. This information is not intended to replace advice given to you by your health care provider. Make sure you discuss any questions you have with your health care provider. Document Revised: 09/07/2019 Document Reviewed: 07/04/2019 Elsevier Patient Education  2022 [employer].

## 2021-06-18 ENCOUNTER — Telehealth: Payer: Self-pay

## 2021-06-18 LAB — BASIC METABOLIC PANEL
BUN/Creatinine Ratio: 19 (ref 9–20)
BUN: 23 mg/dL (ref 6–24)
CO2: 23 mmol/L (ref 20–29)
Calcium: 8.7 mg/dL (ref 8.7–10.2)
Chloride: 101 mmol/L (ref 96–106)
Creatinine, Ser: 1.2 mg/dL (ref 0.76–1.27)
Glucose: 396 mg/dL — ABNORMAL HIGH (ref 70–99)
Potassium: 4.6 mmol/L (ref 3.5–5.2)
Sodium: 137 mmol/L (ref 134–144)
eGFR: 71 mL/min/{1.73_m2} (ref >59)

## 2021-06-18 LAB — CBC
Hematocrit: 35.8 % — ABNORMAL LOW (ref 37.5–51.0)
Hemoglobin: 11.4 g/dL — ABNORMAL LOW (ref 13.0–17.7)
MCH: 26.6 pg (ref 26.6–33.0)
MCHC: 31.8 g/dL (ref 31.5–35.7)
MCV: 84 fL (ref 79–97)
Platelets: 194 10*3/uL (ref 150–450)
RBC: 4.28 x10E6/uL (ref 4.14–5.80)
RDW: 15.1 % (ref 11.6–15.4)
WBC: 5.8 10*3/uL (ref 3.4–10.8)

## 2021-06-18 NOTE — Telephone Encounter (Addendum)
Left a voice message for the patient to give the office a call back for his results.  ----- Message from Beatriz Stallion, PA-C sent at 06/18/2021  9:11 AM EDT ----- Please notify the patient that his blood work looked overall good. His glucose levels remain quite high - he should work with his PCP to maintain good diabetes control. Otherwise his kidney function and electrolyte levels are stable. His blood counts have improved and while he remains mildly anemic, they are getting closer to the normal range compared to 1 month ago. We will see him back in the office 07/08/21 for close follow-up. Thank you!

## 2021-06-19 NOTE — Telephone Encounter (Signed)
Andres Richardson is returning [employer].

## 2021-06-22 ENCOUNTER — Telehealth: Payer: Self-pay | Admitting: Medical

## 2021-06-22 ENCOUNTER — Encounter: Payer: Self-pay | Admitting: Medical

## 2021-06-22 NOTE — Telephone Encounter (Signed)
Patient called and mentioned that he wants the nurse to give him a call in regards to his results and he has questions about the surgery he is having. Please call back

## 2021-06-22 NOTE — Telephone Encounter (Signed)
Spoke to patient he stated he wanted to ask Judy Pimple PA if ok to have colostomy reversal surgery scheduled for 11/21.Advised I will send message to her for advice.

## 2021-06-23 NOTE — Telephone Encounter (Signed)
Spoke to patient Andres Pimple PA's advice given.He will call surgeon and cancel surgery.He will keep appointment already scheduled with Micah Flesher PA 11/23 at 2:20 pm.

## 2021-06-26 ENCOUNTER — Encounter (HOSPITAL_COMMUNITY): Payer: Medicaid Other

## 2021-07-06 ENCOUNTER — Inpatient Hospital Stay: Admit: 2021-07-06 | Payer: MEDICAID | Admitting: General Surgery

## 2021-07-06 ENCOUNTER — Ambulatory Visit: Admit: 2021-07-06 | Payer: MEDICAID | Admitting: General Surgery

## 2021-07-06 SURGERY — COLOSTOMY REVERSAL
Anesthesia: General
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
## 2021-07-08 ENCOUNTER — Ambulatory Visit (INDEPENDENT_AMBULATORY_CARE_PROVIDER_SITE_OTHER): Payer: Self-pay | Admitting: Physician Assistant

## 2021-07-08 ENCOUNTER — Other Ambulatory Visit: Payer: Self-pay

## 2021-07-08 ENCOUNTER — Encounter: Payer: Self-pay | Admitting: Physician Assistant

## 2021-07-08 VITALS — BP 160/81 | HR 96 | Ht 71.0 in | Wt 237.4 lb

## 2021-07-08 DIAGNOSIS — Z01810 Encounter for preprocedural cardiovascular examination: Secondary | ICD-10-CM

## 2021-07-08 DIAGNOSIS — Z01818 Encounter for other preprocedural examination: Secondary | ICD-10-CM

## 2021-07-08 DIAGNOSIS — I739 Peripheral vascular disease, unspecified: Secondary | ICD-10-CM

## 2021-07-08 DIAGNOSIS — E785 Hyperlipidemia, unspecified: Secondary | ICD-10-CM

## 2021-07-08 DIAGNOSIS — I10 Essential (primary) hypertension: Secondary | ICD-10-CM

## 2021-07-08 DIAGNOSIS — I209 Angina pectoris, unspecified: Secondary | ICD-10-CM

## 2021-07-08 DIAGNOSIS — I25119 Atherosclerotic heart disease of native coronary artery with unspecified angina pectoris: Secondary | ICD-10-CM

## 2021-07-08 MED ORDER — ATORVASTATIN CALCIUM 40 MG PO TABS: 40.0000 mg | ORAL_TABLET | Freq: Every day | ORAL | 1 refills | Status: DC

## 2021-07-08 MED ORDER — ISOSORBIDE MONONITRATE ER 30 MG PO TB24: 30.0000 mg | ORAL_TABLET | Freq: Every day | ORAL | 1 refills | Status: DC

## 2021-07-08 MED ORDER — METOPROLOL SUCCINATE ER 25 MG PO TB24: 25.0000 mg | ORAL_TABLET | Freq: Every day | ORAL | 1 refills | Status: DC

## 2021-07-08 NOTE — Patient Instructions (Addendum)
Medication Instructions:  INCREASE Imdur to 60 mg (2 tablets of 30 mg) daily for 1 weeks, then INCREASE Imdur to 90 (3 tablets of 30 mg) daily  START Metoprolol Succinate (Toprol-XL) 25 mg (1 tablet) daily START Crestor 40 mg daily  *If you need a refill on your cardiac medications before your next appointment, please call your pharmacy*  Lab Work: NONE ordered at this time of appointment   If you have labs (blood work) drawn today and your tests are completely normal, you will receive your results only by: MyChart Message (if you have MyChart) OR A paper copy in the mail If you have any lab test that is abnormal or we need to change your treatment, we will call you to review the results.  Testing/Procedures: NONE ordered at this time of appointment   Follow-Up: At [facility], you and your health needs are our priority.  As part of our continuing mission to provide you with exceptional heart care, we have created designated Provider Care Teams.  These Care Teams include your primary Cardiologist (physician) and Advanced Practice Providers (APPs -  Physician Assistants and Nurse Practitioners) who all work together to provide you with the care you need, when you need it.  We recommend signing up for the patient portal called "MyChart".  Sign up information is provided on this After Visit Summary.  MyChart is used to connect with patients for Virtual Visits (Telemedicine).  Patients are able to view lab/test results, encounter notes, upcoming appointments, etc.  Non-urgent messages can be sent to your provider as well.   To learn more about what you can do with MyChart, go to [email or web address].    Your next appointment:   3-4 week(s)  The format for your next appointment:   In Person  Provider:   APP       Other Instructions Continue to monitor blood pressure at home
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
## 2021-08-07 ENCOUNTER — Other Ambulatory Visit: Payer: Self-pay

## 2021-08-07 ENCOUNTER — Ambulatory Visit (INDEPENDENT_AMBULATORY_CARE_PROVIDER_SITE_OTHER): Payer: Medicaid Other | Admitting: Adult Health

## 2021-08-07 ENCOUNTER — Encounter: Payer: Self-pay | Admitting: Adult Health

## 2021-08-07 VITALS — BP 122/68 | HR 90 | Ht 71.0 in | Wt 231.6 lb

## 2021-08-07 DIAGNOSIS — E7800 Pure hypercholesterolemia, unspecified: Secondary | ICD-10-CM

## 2021-08-07 DIAGNOSIS — R29818 Other symptoms and signs involving the nervous system: Secondary | ICD-10-CM

## 2021-08-07 DIAGNOSIS — Z01810 Encounter for preprocedural cardiovascular examination: Secondary | ICD-10-CM

## 2021-08-07 DIAGNOSIS — Z720 Tobacco use: Secondary | ICD-10-CM

## 2021-08-07 DIAGNOSIS — I25119 Atherosclerotic heart disease of native coronary artery with unspecified angina pectoris: Secondary | ICD-10-CM

## 2021-08-07 DIAGNOSIS — I739 Peripheral vascular disease, unspecified: Secondary | ICD-10-CM

## 2021-08-07 DIAGNOSIS — I10 Essential (primary) hypertension: Secondary | ICD-10-CM

## 2021-08-07 NOTE — Addendum Note (Signed)
Addended by: Velora Mediate F on: 08/07/2021 03:32 PM   Modules accepted: Orders

## 2021-08-07 NOTE — Patient Instructions (Signed)
Medication Instructions:  Your Physician recommend you continue on your current medication as directed.    *If you need a refill on your cardiac medications before your next appointment, please call your pharmacy*    Testing/Procedures: Request placed for sleep study. They will contact you.   Follow-Up: At [facility], you and your health needs are our priority.  As part of our continuing mission to provide you with exceptional heart care, we have created designated Provider Care Teams.  These Care Teams include your primary Cardiologist (physician) and Advanced Practice Providers (APPs -  Physician Assistants and Nurse Practitioners) who all work together to provide you with the care you need, when you need it.  We recommend signing up for the patient portal called "MyChart".  Sign up information is provided on this After Visit Summary.  MyChart is used to connect with patients for Virtual Visits (Telemedicine).  Patients are able to view lab/test results, encounter notes, upcoming appointments, etc.  Non-urgent messages can be sent to your provider as well.   To learn more about what you can do with MyChart, go to [email or web address].    Your next appointment:   3 month(s)  The format for your next appointment:   In Person  Provider:   Parke Poisson, MD    Other Instructions Please schedule appointments with your vascular and general surgeons.  Social worker Octavio Graves will be contacted on 08/11/21 to assist you with your medications.

## 2021-08-25 ENCOUNTER — Telehealth: Payer: Self-pay | Admitting: *Deleted

## 2021-08-25 NOTE — Telephone Encounter (Signed)
HST appointment details left on voicemail. 

## 2021-08-25 NOTE — Telephone Encounter (Signed)
-----   Message from Melina Modena, RN sent at 08/07/2021  3:33 PM EST ----- Regarding: home sleep study Please schedule patient for a home sleep study. Dr. Tresa Endo to read.

## 2021-08-31 ENCOUNTER — Telehealth: Payer: Self-pay | Admitting: Internal Medicine

## 2021-08-31 NOTE — Telephone Encounter (Signed)
Patient called and asked to speak to the Social Worker [employer]

## 2021-09-01 NOTE — Telephone Encounter (Signed)
LCSW attempted to reach pt this morning, no answer- left voicemail requesting call back.   Octavio Graves, MSW, LCSW Clinical Social Worker II [facility] Navigation  [phone]- work cell phone (preferred) [phone]- desk phone

## 2021-09-02 ENCOUNTER — Telehealth: Payer: Self-pay | Admitting: Licensed Clinical Social Worker
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
## 2021-09-07 ENCOUNTER — Encounter (HOSPITAL_BASED_OUTPATIENT_CLINIC_OR_DEPARTMENT_OTHER): Payer: Self-pay

## 2021-09-07 ENCOUNTER — Emergency Department (HOSPITAL_BASED_OUTPATIENT_CLINIC_OR_DEPARTMENT_OTHER)
Admission: EM | Admit: 2021-09-07 | Discharge: 2021-09-07 | Disposition: A | Discharge status: Home / Self Care | Payer: Medicaid Other | Attending: Emergency Medicine | Admitting: Emergency Medicine

## 2021-09-07 ENCOUNTER — Emergency Department (HOSPITAL_BASED_OUTPATIENT_CLINIC_OR_DEPARTMENT_OTHER): Payer: Medicaid Other

## 2021-09-07 ENCOUNTER — Other Ambulatory Visit: Payer: Self-pay

## 2021-09-07 DIAGNOSIS — K625 Hemorrhage of anus and rectum: Secondary | ICD-10-CM | POA: Diagnosis present

## 2021-09-07 DIAGNOSIS — Z7982 Long term (current) use of aspirin: Secondary | ICD-10-CM | POA: Diagnosis not present

## 2021-09-07 DIAGNOSIS — K648 Other hemorrhoids: Secondary | ICD-10-CM

## 2021-09-07 DIAGNOSIS — Z794 Long term (current) use of insulin: Secondary | ICD-10-CM | POA: Diagnosis not present

## 2021-09-07 DIAGNOSIS — R109 Unspecified abdominal pain: Secondary | ICD-10-CM | POA: Insufficient documentation

## 2021-09-07 DIAGNOSIS — Z7984 Long term (current) use of oral hypoglycemic drugs: Secondary | ICD-10-CM | POA: Diagnosis not present

## 2021-09-07 LAB — CBC
HCT: 39.1 % (ref 39.0–52.0)
Hemoglobin: 12.9 g/dL — ABNORMAL LOW (ref 13.0–17.0)
MCH: 27.9 pg (ref 26.0–34.0)
MCHC: 33 g/dL (ref 30.0–36.0)
MCV: 84.4 fL (ref 80.0–100.0)
Platelets: 214 10*3/uL (ref 150–400)
RBC: 4.63 MIL/uL (ref 4.22–5.81)
RDW: 16.3 % — ABNORMAL HIGH (ref 11.5–15.5)
WBC: 7.5 10*3/uL (ref 4.0–10.5)
nRBC: 0 % (ref 0.0–0.2)

## 2021-09-07 LAB — COMPREHENSIVE METABOLIC PANEL
ALT: 10 U/L (ref 0–44)
AST: 9 U/L — ABNORMAL LOW (ref 15–41)
Albumin: 3.6 g/dL (ref 3.5–5.0)
Alkaline Phosphatase: 69 U/L (ref 38–126)
Anion gap: 13 (ref 5–15)
BUN: 22 mg/dL — ABNORMAL HIGH (ref 6–20)
CO2: 21 mmol/L — ABNORMAL LOW (ref 22–32)
Calcium: 9 mg/dL (ref 8.9–10.3)
Chloride: 105 mmol/L (ref 98–111)
Creatinine, Ser: 1.02 mg/dL (ref 0.61–1.24)
GFR, Estimated: >60 mL/min (ref >60)
Glucose, Bld: 403 mg/dL — ABNORMAL HIGH (ref 70–99)
Potassium: 3.9 mmol/L (ref 3.5–5.1)
Sodium: 139 mmol/L (ref 135–145)
Total Bilirubin: 0.3 mg/dL (ref 0.3–1.2)
Total Protein: 7.1 g/dL (ref 6.5–8.1)

## 2021-09-07 MED ORDER — IOHEXOL 300 MG/ML  SOLN
100.0000 mL | Freq: Once | INTRAMUSCULAR | Status: AC | PRN
  Administered 2021-09-07: 100 mL via INTRAVENOUS

## 2021-09-07 NOTE — ED Triage Notes (Signed)
Pt reports he began experiencing rectal bleeding this morning. Pt denies any other symptoms. Pt has a colostomy. States he had rectal/abdominal surgery last November.

## 2021-09-07 NOTE — Discharge Instructions (Signed)
Go to [facility] or [facility] if you develop severe rectal bleeding, weakness, dizziness or any other problems Call your surgeon tomorrow to schedule a follow-up visit

## 2021-09-07 NOTE — ED Notes (Signed)
EMT-P provided AVS using Teachback Method. Patient verbalizes understanding of Discharge Instructions. Opportunity for Questioning and Answers were provided by EMT-P. Patient Discharged from ED.  ? ?

## 2021-09-07 NOTE — ED Provider Notes (Signed)
[facility] EMERGENCY DEPT Provider Note   CSN: [id number] Arrival date & time: 09/07/21  1358     History  Chief Complaint  Patient presents with   Rectal Bleeding    Andres Richardson is a 56 y.o. male.  56 year old male presents with rectal bleeding x1 day.  Has a history of necrotizing fasciitis and had diverting colostomy.  States that has been no blood in the back.  Denies any fever.  Slight abdominal cramping but no pain.  No emesis noted.  Bleeding described as bright red blood per rectum.  No prior history of same.  No treatment use prior to arrival      Home Medications Prior to Admission medications   Medication Sig Start Date End Date Taking? Authorizing Provider  acetaminophen (TYLENOL) 500 MG tablet Take 500 mg by mouth every 6 (six) hours as needed.    [provider]  aspirin EC 81 MG EC tablet Take 1 tablet (81 mg total) by mouth daily. Swallow whole. 05/15/21   Leatha Gilding, MD  atorvastatin (LIPITOR) 40 MG tablet Take 1 tablet (40 mg total) by mouth daily. 07/08/21   Duke, Roe Rutherford, PA  empagliflozin (JARDIANCE) 10 MG TABS tablet Take 10 mg by mouth daily in the afternoon. 08/03/21   [provider]  furosemide (LASIX) 40 MG tablet Take 1 tablet (40 mg total) by mouth daily. 05/22/21   Kathlen Mody, MD  glipiZIDE (GLUCOTROL XL) 5 MG 24 hr tablet Take 1 tablet (5 mg total) by mouth daily. Patient not taking: Reported on 08/07/2021 03/10/21   Almon Hercules, MD  insulin lispro (HUMALOG) 100 UNIT/ML KwikPen 10 units 06/01/21   [provider]  insulin NPH-regular Human (NOVOLIN 70/30) (70-30) 100 UNIT/ML injection Inject 15 Units into the skin 2 (two) times daily with a meal. Patient not taking: Reported on 08/07/2021 03/10/21   Almon Hercules, MD  Iron, Ferrous Sulfate, 325 (65 Fe) MG TABS 1 tablet 06/02/21   [provider]  isosorbide mononitrate (IMDUR) 30 MG 24 hr tablet Take 1 tablet (30 mg total) by mouth  daily. 07/08/21   Duke, Roe Rutherford, PA  metFORMIN (GLUCOPHAGE-XR) 750 MG 24 hr tablet Take 1 tablet (750 mg total) by mouth 2 (two) times daily. 03/10/21   Almon Hercules, MD  metoprolol succinate (TOPROL-XL) 25 MG 24 hr tablet Take 1 tablet (25 mg total) by mouth daily. 07/08/21   Duke, Roe Rutherford, PA  Multiple Vitamin (MULTIVITAMIN WITH MINERALS) TABS tablet Take 1 tablet by mouth daily. Patient not taking: Reported on 08/07/2021 03/10/21   Almon Hercules, MD  nitroGLYCERIN (NITROSTAT) 0.4 MG SL tablet Place 1 tablet (0.4 mg total) under the tongue every 5 (five) minutes as needed for chest pain. Patient not taking: Reported on 08/07/2021 06/17/21 09/15/21  Kroeger, Ovidio Kin., PA-C  OVER THE COUNTER MEDICATION Take 1 tablet by mouth every other day. Leg Cramps Take 1 tablet every other day    [provider]      Allergies    Adhesive [tape]    Review of Systems   Review of Systems  All other systems reviewed and are negative.  Physical Exam Updated Vital Signs BP 125/62    Pulse (!) 104    Temp 98.5 F (36.9 C) (Oral)    Resp 18    Ht 1.803 m (5\' 11" )    SpO2 98%    BMI 32.30 kg/m  Physical Exam Vitals and nursing note reviewed.  Constitutional:      General: He is not in acute distress.    Appearance: Normal appearance. He is well-developed. He is not toxic-appearing.  HENT:     Head: Normocephalic and atraumatic.  Eyes:     General: Lids are normal.     Conjunctiva/sclera: Conjunctivae normal.     Pupils: Pupils are equal, round, and reactive to light.  Neck:     Thyroid: No thyroid mass.     Trachea: No tracheal deviation.  Cardiovascular:     Rate and Rhythm: Normal rate and regular rhythm.     Heart sounds: Normal heart sounds. No murmur heard.   No gallop.  Pulmonary:     Effort: Pulmonary effort is normal. No respiratory distress.     Breath sounds: Normal breath sounds. No stridor. No decreased breath sounds, wheezing, rhonchi or rales.  Abdominal:      General: There is no distension.     Palpations: Abdomen is soft.     Tenderness: There is no abdominal tenderness. There is no rebound.    Genitourinary:    Comments: Blood on digital rectal exam. Musculoskeletal:        General: No tenderness. Normal range of motion.     Cervical back: Normal range of motion and neck supple.  Skin:    General: Skin is warm and dry.     Findings: No abrasion or rash.  Neurological:     Mental Status: He is alert and oriented to person, place, and time. Mental status is at baseline.     GCS: GCS eye subscore is 4. GCS verbal subscore is 5. GCS motor subscore is 6.     Cranial Nerves: No cranial nerve deficit.     Sensory: No sensory deficit.     Motor: Motor function is intact.  Psychiatric:        Attention and Perception: Attention normal.        Speech: Speech normal.        Behavior: Behavior normal.    ED Results / Procedures / Treatments   Labs (all labs ordered are listed, but only abnormal results are displayed) Labs Reviewed  COMPREHENSIVE METABOLIC PANEL - Abnormal; Notable for the following components:      Result Value   CO2 21 (*)    Glucose, Bld 403 (*)    BUN 22 (*)    AST 9 (*)    All other components within normal limits  CBC - Abnormal; Notable for the following components:   Hemoglobin 12.9 (*)    RDW 16.3 (*)    All other components within normal limits  POC OCCULT BLOOD, ED  TYPE AND SCREEN    EKG None  Radiology No results found.  Procedures Procedures    Medications Ordered in ED Medications - No data to display  ED Course/ Medical Decision Making/ A&P                           Medical Decision Making Amount and/or Complexity of Data Reviewed Labs: ordered. Radiology: ordered.  Risk Prescription drug management.   Patient's old records were reviewed.  Has stable hemoglobin 12.9.  Has scant bleeding noted per rectum.  No hemorrhage noted.  Abdominal CT consistent with internal hemorrhoids.   Discussion with patient and his friend in the room.  Patient instructed to follow-up with general surgery tomorrow.  No indication for admission as he is hemodynamically stable with stable hemoglobin.  Strict return precautions given.        Final Clinical Impression(s) / ED Diagnoses Final diagnoses:  None    Rx / DC Orders ED Discharge Orders     None         Lorre NickAllen, Nichelle Renwick, MD 09/07/21 1933

## 2021-09-15 ENCOUNTER — Other Ambulatory Visit: Payer: Self-pay

## 2021-09-15 ENCOUNTER — Ambulatory Visit (HOSPITAL_BASED_OUTPATIENT_CLINIC_OR_DEPARTMENT_OTHER): Payer: Medicaid Other | Attending: Adult Health | Admitting: Cardiovascular Disease

## 2021-09-15 DIAGNOSIS — I5031 Acute diastolic (congestive) heart failure: Secondary | ICD-10-CM

## 2021-09-15 DIAGNOSIS — G4733 Obstructive sleep apnea (adult) (pediatric): Secondary | ICD-10-CM

## 2021-09-15 DIAGNOSIS — R29818 Other symptoms and signs involving the nervous system: Secondary | ICD-10-CM | POA: Insufficient documentation

## 2021-09-16 ENCOUNTER — Telehealth: Payer: Self-pay | Admitting: Licensed Clinical Social Worker

## 2021-09-16 NOTE — Telephone Encounter (Signed)
LCSW f/u with pt, was able to reach him via telephone at [phone]. Re-introduced self, role, reason for call. Pt shares that his "sister" [name] is working on the paperwork and will send it in. LCSW shared that if pt has any additional questions to give me a call. Provided additional guidance that pt would need to have Dr. Paulino Rily (PCP) complete the provider portion of the [employer]ares application. I will f/u again next week to answer any additional questions.   Andres Richardson, MSW, LCSW Clinical Social Worker II [facility] Navigation  [phone]- work cell phone (preferred) [phone]- desk phone

## 2021-10-07 ENCOUNTER — Encounter (HOSPITAL_BASED_OUTPATIENT_CLINIC_OR_DEPARTMENT_OTHER): Payer: Self-pay | Admitting: Cardiovascular Disease

## 2021-10-07 NOTE — Procedures (Signed)
° ° ° °  Patient Name: Andres Richardson, Andres Richardson Date: 09/16/2021 Gender: Male D.O.B: 06/23/1966 Age (years): 36 Referring Provider: Joni Reining NP Height (inches): 71 Interpreting Physician: Nicki Guadalajara MD, ABSM Weight (lbs): 231 RPSGT: Mitchell Sink BMI: 32 MRN: [id number] Neck Size: 17.50  CLINICAL INFORMATION Sleep Study Type: HST  Indication for sleep study: snoring, daytime sleepiness  Epworth Sleepiness Score: 11  SLEEP STUDY TECHNIQUE A multi-channel overnight portable sleep study was performed. The channels recorded were: nasal airflow, thoracic respiratory movement, and oxygen saturation with a pulse oximetry. Snoring was also monitored.  MEDICATIONS acetaminophen (TYLENOL) 500 MG tablet aspirin EC 81 MG EC tablet atorvastatin (LIPITOR) 40 MG tablet empagliflozin (JARDIANCE) 10 MG TABS tablet furosemide (LASIX) 40 MG tablet glipiZIDE (GLUCOTROL XL) 5 MG 24 hr tablet insulin lispro (HUMALOG) 100 UNIT/ML KwikPen insulin NPH-regular Human (NOVOLIN 70/30) (70-30) 100 UNIT/ML injection Iron, Ferrous Sulfate, 325 (65 Fe) MG TABS isosorbide mononitrate (IMDUR) 30 MG 24 hr tablet metFORMIN (GLUCOPHAGE-XR) 750 MG 24 hr tablet metoprolol succinate (TOPROL-XL) 25 MG 24 hr tablet Multiple Vitamin (MULTIVITAMIN WITH MINERALS) TABS tablet nitroGLYCERIN (NITROSTAT) 0.4 MG SL tablet (Expired) Patient self administered medications include: N/A.  SLEEP ARCHITECTURE Patient was studied for 358 minutes. The sleep efficiency was 100.0 % and the patient was supine for 19.4%. The arousal index was 0.0 per hour.  RESPIRATORY PARAMETERS The overall AHI was 45.9 per hour, with a central apnea index of 0 per hour. Supine sleep AHI was 51.7/h.  The oxygen nadir was 83% during sleep.  CARDIAC DATA Mean heart rate during sleep was 84.8 bpm.  IMPRESSIONS - Severe obstructive sleep apnea occurred during this study (AHI 45.9/h). - Moderate oxygen desaturation to a nadir of  83%. - Patient snored 0.8% during the sleep.  DIAGNOSIS - Obstructive Sleep Apnea (G47.33) - Nocturnal Hypoxemia (G47.36)  RECOMMENDATIONS - In this patient with significant cardiovascular comorbidities and severe sleep apnea, recommend an in-lab CPAP titration evaluation. If unable to obtain an in-lab study, initiate Auto-PAP with EPR of 3 at 8 - 20 cm of water.  - Effort should be made to optimize nasal and oropharyngeal patency. - The patient should be counseled to avoid supine sleep.  - Avoid alcohol, sedatives and other CNS depressants that may worsen sleep apnea and disrupt normal sleep architecture. - Sleep hygiene should be reviewed to assess factors that may improve sleep quality. - Weight management and regular exercise should be initiated or continued. - Recommend a download and sleep clinic evaluation after one month of therapy.   [Electronically signed] 10/07/2021 10:13 AM  Nicki Guadalajara MD, [facility], ABSM Diplomate, American Board of Sleep Medicine   NPI: [id number]  [facility] PH: [phone]   FX: [phone] ACCREDITED BY THE AMERICAN ACADEMY OF SLEEP MEDICINE

## 2021-10-12 ENCOUNTER — Telehealth: Payer: Self-pay | Admitting: *Deleted

## 2021-10-12 NOTE — Telephone Encounter (Signed)
Left message  to return a call to discuss his sleep study results. 

## 2021-10-12 NOTE — Telephone Encounter (Signed)
-----   Message from Lennette Bihari, MD sent at 10/07/2021 10:19 AM EST ----- Andres Richardson, please notify pt , recommend CPAP titration or Auto-PAP

## 2021-10-14 ENCOUNTER — Telehealth: Payer: Self-pay | Admitting: Licensed Clinical Social Worker

## 2021-10-14 NOTE — Telephone Encounter (Signed)
Patient returned a call to me and was given his HST results and recommendations. Due to having no insurance he wants to bypass the titration sleep study and do APAP. He has been trying to get medicaid, but says that it has been a "nightmare." I told him I will contact the DME companies to see if they have any of the [employer]Med Airsense machines they have picked up from non compliant patients that cannot be resold as new. Patient agrees with the plan and thanked me for helping him. ?

## 2021-10-14 NOTE — Telephone Encounter (Signed)
LCSW left message for pt this afternoon to f/u on [employer] application. Previous conversations on 2/1 mentioned that pt would complete it with his friend/sister [name] and that he needed to take the [facility] Cares application to PCP to complete. No answer, voicemail requested call back.  ? ?Andres Richardson, MSW, LCSW ?Clinical Social Worker II ?[facility] Navigation  ?[phone]- work cell phone (preferred) ?[phone]- desk phone ? ?

## 2021-10-14 NOTE — Telephone Encounter (Signed)
LCSW received a call back from pt he shares that they have completed the applications. He has questions about whether or not [employer] carries all of the various insulins that he takes. LCSW able to identify at least 2 on the formulary that [facility] but I encouraged him once we submit the applications to reach out to their pharmacy to discuss specifics.  ?Pt will bring this application to his appt on 3/15. Pt also shared that his friend has a CPAP machine that he is not using and inquires about assistance w/ masks and tubing. LCSW encouraged him to bring it to the office also during that appt and speak with team to see what options are available/ensure it is the appropriate kind of machine.  ? ?Octavio Graves, MSW, LCSW ?Clinical Social Worker II ?[place] [facility] Navigation  ?[phone]- work cell phone (preferred) ?[phone]- desk phone ? ?
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
## 2021-10-28 ENCOUNTER — Telehealth: Payer: Self-pay | Admitting: Licensed Clinical Social Worker

## 2021-10-28 ENCOUNTER — Ambulatory Visit (INDEPENDENT_AMBULATORY_CARE_PROVIDER_SITE_OTHER): Payer: Medicaid Other | Admitting: Physician Assistant

## 2021-10-28 ENCOUNTER — Telehealth: Payer: Self-pay | Admitting: *Deleted

## 2021-10-28 ENCOUNTER — Other Ambulatory Visit: Payer: Self-pay

## 2021-10-28 ENCOUNTER — Encounter: Payer: Self-pay | Admitting: Physician Assistant

## 2021-10-28 VITALS — BP 130/73 | HR 97 | Ht 71.0 in | Wt 230.6 lb

## 2021-10-28 DIAGNOSIS — I5033 Acute on chronic diastolic (congestive) heart failure: Secondary | ICD-10-CM | POA: Diagnosis not present

## 2021-10-28 DIAGNOSIS — I25119 Atherosclerotic heart disease of native coronary artery with unspecified angina pectoris: Secondary | ICD-10-CM | POA: Diagnosis not present

## 2021-10-28 DIAGNOSIS — E7800 Pure hypercholesterolemia, unspecified: Secondary | ICD-10-CM | POA: Diagnosis not present

## 2021-10-28 DIAGNOSIS — I10 Essential (primary) hypertension: Secondary | ICD-10-CM

## 2021-10-28 DIAGNOSIS — Z794 Long term (current) use of insulin: Secondary | ICD-10-CM

## 2021-10-28 DIAGNOSIS — E119 Type 2 diabetes mellitus without complications: Secondary | ICD-10-CM

## 2021-10-28 DIAGNOSIS — G4733 Obstructive sleep apnea (adult) (pediatric): Secondary | ICD-10-CM

## 2021-10-28 DIAGNOSIS — I739 Peripheral vascular disease, unspecified: Secondary | ICD-10-CM

## 2021-10-28 MED ORDER — FUROSEMIDE 40 MG PO TABS: 40.0000 mg | ORAL_TABLET | Freq: Every day | ORAL | 3 refills | Status: DC

## 2021-10-28 NOTE — Telephone Encounter (Signed)
Patient was here today for appointment with Bettina Gavia, PA. He brought in a Airsense 10 auto unit that belonged to a friend who is now deceased. I set the machine to the pressures ordered by Dr Tresa Endo via the sleep study report.  Patient was given a Airfit F-30i mask sample to use. He was instructed to shave his beard down in order to help the mask to seal properly. He was also told to wash his face and be sure that it is dry, removing all of the dirt and oils from the day. This too will help the mask to seal. He was instructed on how to clean the machine properly. He states that his room mate also has a CPAP machine and knows the "ends and outs" of it. He was given my business card to contact me if he needs any assistance before his compliance visit with Dr Tresa Endo. Patient voiced understanding of everything that he was instructed on today.  Machine serial Number [id number]  DN 839. Patient will be added to [facility] for future reference. ?

## 2021-10-28 NOTE — Patient Instructions (Addendum)
Medication Instructions:  ?The current medical regimen is effective;  continue present plan and medications. ? ?*If you need a refill on your cardiac medications before your next appointment, please call your pharmacy* ? ?Lab Work: ?Return in 2 weeks for blood work (BMET and Magnesium) ?If you have labs (blood work) drawn today and your tests are completely normal, you will receive your results only by: ?MyChart Message (if you have MyChart) OR ?A paper copy in the mail ?If you have any lab test that is abnormal or we need to change your treatment, we will call you to review the results. ? ?Testing/Procedures: ?None ordered ? ?Follow-Up: ?At [facility], you and your health needs are our priority.  As part of our continuing mission to provide you with exceptional heart care, we have created designated Provider Care Teams.  These Care Teams include your primary Cardiologist (physician) and Advanced Practice Providers (APPs -  Physician Assistants and Nurse Practitioners) who all work together to provide you with the care you need, when you need it. ? ?We recommend signing up for the patient portal called "MyChart".  Sign up information is provided on this After Visit Summary.  MyChart is used to connect with patients for Virtual Visits (Telemedicine).  Patients are able to view lab/test results, encounter notes, upcoming appointments, etc.  Non-urgent messages can be sent to your provider as well.   ?To learn more about what you can do with MyChart, go to [email or web address].   ? ?Your next appointment:   ?4 month(s) ? ?The format for your next appointment:   ?In Person ? ?Provider:   ?Elouise Munroe, MD  ? ?Follow up with Dr. Claiborne Billings in 60 to 90 days. ? ? ? ?

## 2021-10-28 NOTE — Telephone Encounter (Signed)
LCSW has sent in [facility] application for eligibility review. ? ?If pt approved we will assist with transitioning the following to the mail order pharmacy- ?- aspirin ?- atorvastatin ?- furosemide ?- isosorbide mononitrate ? ?Octavio Graves, MSW, LCSW ?Clinical Social Worker II ?[place] [facility] Navigation  ?[phone]- work cell phone (preferred) ?[phone]- desk phone ? ?

## 2021-10-28 NOTE — Progress Notes (Signed)
Pt brought [employer] application as well as approval for [facility]ard[facility] PAP ([employer]).  ?I have securely scanned and submitted the [employer] application, requested Sarah, RN, scan letter of approval for Jardiance into chart (this is managed by PCP). I remain available.  ? ?Octavio Graves, MSW, LCSW ?Clinical Social Worker II ?[facility]  ?[phone]- work cell phone (preferred) ?[phone]- desk phone ? ?

## 2021-11-06 ENCOUNTER — Other Ambulatory Visit: Payer: Self-pay

## 2021-11-06 MED ORDER — ISOSORBIDE MONONITRATE ER 30 MG PO TB24: 30.0000 mg | ORAL_TABLET | Freq: Every day | ORAL | 3 refills | Status: DC

## 2021-11-10 ENCOUNTER — Telehealth: Payer: Self-pay | Admitting: Licensed Clinical Social Worker

## 2021-11-10 NOTE — Telephone Encounter (Signed)
LCSW received a call from pt, he shares that he needs a medication ordered from his last appt and had left two messages on office voicemail. He never received a call back or a call from [facility] so he is unsure the status.  ?I was able to find that medication had been reordered on Friday 3/24 to [facility]. Encouraged him to reach out to [facility] and let me know if he has any additional delays. Pt aware we are still awaiting determination from [facility], a follow up email was sent yesterday. Once that is done then we can resend medications to their pharmacy.  ? ?Pt called [facility] and reported back that they are working on that script now. I remain available.  ? ?Andres Richardson, MSW, LCSW ?Clinical Social Worker II ? [facility] Care Navigation  ?[phone]- work cell phone (preferred) ?[phone]- desk phone ? ?

## 2021-11-11 ENCOUNTER — Telehealth: Payer: Self-pay | Admitting: Licensed Clinical Social Worker

## 2021-11-11 DIAGNOSIS — I5033 Acute on chronic diastolic (congestive) heart failure: Secondary | ICD-10-CM

## 2021-11-11 DIAGNOSIS — E7800 Pure hypercholesterolemia, unspecified: Secondary | ICD-10-CM

## 2021-11-11 DIAGNOSIS — E119 Type 2 diabetes mellitus without complications: Secondary | ICD-10-CM

## 2021-11-11 DIAGNOSIS — I10 Essential (primary) hypertension: Secondary | ICD-10-CM

## 2021-11-11 DIAGNOSIS — I25119 Atherosclerotic heart disease of native coronary artery with unspecified angina pectoris: Secondary | ICD-10-CM

## 2021-11-11 DIAGNOSIS — I739 Peripheral vascular disease, unspecified: Secondary | ICD-10-CM

## 2021-11-11 DIAGNOSIS — G4733 Obstructive sleep apnea (adult) (pediatric): Secondary | ICD-10-CM

## 2021-11-11 MED ORDER — ASPIRIN 81 MG PO TBEC: 81.0000 mg | DELAYED_RELEASE_TABLET | Freq: Every day | ORAL | 11 refills | Status: DC

## 2021-11-11 MED ORDER — ATORVASTATIN CALCIUM 40 MG PO TABS: 40.0000 mg | ORAL_TABLET | Freq: Every day | ORAL | 1 refills | Status: DC

## 2021-11-11 MED ORDER — FUROSEMIDE 40 MG PO TABS: 40.0000 mg | ORAL_TABLET | Freq: Every day | ORAL | 1 refills | Status: DC

## 2021-11-11 MED ORDER — ISOSORBIDE MONONITRATE ER 30 MG PO TB24: 30.0000 mg | ORAL_TABLET | Freq: Every day | ORAL | 1 refills | Status: DC

## 2021-11-11 NOTE — Addendum Note (Signed)
Addended by: Bea Laura B on: 11/11/2021 02:23 PM ? ? Modules accepted: Orders ? ?

## 2021-11-11 NOTE — Telephone Encounter (Signed)
Refills sent to [facility] pharmacy electronically for- aspirin, atorvastatin, furosemide, and isosorbide ?

## 2021-11-11 NOTE — Telephone Encounter (Signed)
LCSW called [employer] this morning as no response back to email inquiry. Was able to reach them at [phone], confirmed pt approved for mail order pharmacy for the following medications prescribed by our office: ?- aspirin ?- atorvastatin ?- furosemide ?- isosorbide mononitrate ? ?I will route this to Cleotilde Neer, RN w/ Dr. Jacques Navy.  ?I will contact pt to let him know about approval. Additional medications prescribed by other providers can also be sent but pt will need to confirm they are on [employer] formulary and have those offices send either fax or e-scripts.  ? ?Octavio Graves, MSW, LCSW ?Clinical Social Worker II ?[facility]  ?[phone]- work cell phone (preferred) ?[phone]- desk phone ? ?

## 2021-12-02 ENCOUNTER — Inpatient Hospital Stay (HOSPITAL_COMMUNITY)
Admission: EM | Admit: 2021-12-02 | Discharge: 2021-12-06 | DRG: 637 | Disposition: A | Discharge status: Home / Self Care | Payer: Medicaid Other | Attending: Internal Medicine | Admitting: Internal Medicine

## 2021-12-02 ENCOUNTER — Other Ambulatory Visit: Payer: Self-pay

## 2021-12-02 ENCOUNTER — Encounter (HOSPITAL_COMMUNITY): Payer: Self-pay | Admitting: Emergency Medicine

## 2021-12-02 DIAGNOSIS — Z7984 Long term (current) use of oral hypoglycemic drugs: Secondary | ICD-10-CM

## 2021-12-02 DIAGNOSIS — E785 Hyperlipidemia, unspecified: Secondary | ICD-10-CM | POA: Diagnosis present

## 2021-12-02 DIAGNOSIS — Z6829 Body mass index (BMI) 29.0-29.9, adult: Secondary | ICD-10-CM

## 2021-12-02 DIAGNOSIS — E782 Mixed hyperlipidemia: Secondary | ICD-10-CM | POA: Diagnosis present

## 2021-12-02 DIAGNOSIS — Z87891 Personal history of nicotine dependence: Secondary | ICD-10-CM

## 2021-12-02 DIAGNOSIS — Z9049 Acquired absence of other specified parts of digestive tract: Secondary | ICD-10-CM

## 2021-12-02 DIAGNOSIS — E1140 Type 2 diabetes mellitus with diabetic neuropathy, unspecified: Secondary | ICD-10-CM | POA: Diagnosis present

## 2021-12-02 DIAGNOSIS — E870 Hyperosmolality and hypernatremia: Secondary | ICD-10-CM | POA: Diagnosis present

## 2021-12-02 DIAGNOSIS — K226 Gastro-esophageal laceration-hemorrhage syndrome: Secondary | ICD-10-CM | POA: Diagnosis present

## 2021-12-02 DIAGNOSIS — K920 Hematemesis: Secondary | ICD-10-CM | POA: Diagnosis not present

## 2021-12-02 DIAGNOSIS — Z933 Colostomy status: Secondary | ICD-10-CM

## 2021-12-02 DIAGNOSIS — E1110 Type 2 diabetes mellitus with ketoacidosis without coma: Principal | ICD-10-CM | POA: Diagnosis present

## 2021-12-02 DIAGNOSIS — E875 Hyperkalemia: Secondary | ICD-10-CM | POA: Diagnosis present

## 2021-12-02 DIAGNOSIS — E0800 Diabetes mellitus due to underlying condition with hyperosmolarity without nonketotic hyperglycemic-hyperosmolar coma (NKHHC): Principal | ICD-10-CM

## 2021-12-02 DIAGNOSIS — I2510 Atherosclerotic heart disease of native coronary artery without angina pectoris: Secondary | ICD-10-CM | POA: Diagnosis present

## 2021-12-02 DIAGNOSIS — Z7982 Long term (current) use of aspirin: Secondary | ICD-10-CM

## 2021-12-02 DIAGNOSIS — K269 Duodenal ulcer, unspecified as acute or chronic, without hemorrhage or perforation: Secondary | ICD-10-CM | POA: Diagnosis present

## 2021-12-02 DIAGNOSIS — Z8249 Family history of ischemic heart disease and other diseases of the circulatory system: Secondary | ICD-10-CM

## 2021-12-02 DIAGNOSIS — K2210 Ulcer of esophagus without bleeding: Secondary | ICD-10-CM | POA: Diagnosis present

## 2021-12-02 DIAGNOSIS — N179 Acute kidney failure, unspecified: Secondary | ICD-10-CM | POA: Diagnosis present

## 2021-12-02 DIAGNOSIS — I10 Essential (primary) hypertension: Secondary | ICD-10-CM | POA: Diagnosis present

## 2021-12-02 DIAGNOSIS — Z79899 Other long term (current) drug therapy: Secondary | ICD-10-CM

## 2021-12-02 DIAGNOSIS — E1165 Type 2 diabetes mellitus with hyperglycemia: Secondary | ICD-10-CM

## 2021-12-02 DIAGNOSIS — E663 Overweight: Secondary | ICD-10-CM | POA: Diagnosis present

## 2021-12-02 DIAGNOSIS — E1151 Type 2 diabetes mellitus with diabetic peripheral angiopathy without gangrene: Secondary | ICD-10-CM | POA: Diagnosis present

## 2021-12-02 DIAGNOSIS — I5189 Other ill-defined heart diseases: Secondary | ICD-10-CM

## 2021-12-02 DIAGNOSIS — Z91048 Other nonmedicinal substance allergy status: Secondary | ICD-10-CM

## 2021-12-02 DIAGNOSIS — Z794 Long term (current) use of insulin: Secondary | ICD-10-CM

## 2021-12-02 DIAGNOSIS — R17 Unspecified jaundice: Secondary | ICD-10-CM | POA: Diagnosis present

## 2021-12-02 DIAGNOSIS — K3189 Other diseases of stomach and duodenum: Secondary | ICD-10-CM | POA: Diagnosis present

## 2021-12-02 DIAGNOSIS — E860 Dehydration: Secondary | ICD-10-CM | POA: Diagnosis present

## 2021-12-02 DIAGNOSIS — K922 Gastrointestinal hemorrhage, unspecified: Secondary | ICD-10-CM

## 2021-12-02 HISTORY — DX: Other ill-defined heart diseases: I51.89

## 2021-12-02 LAB — CBG MONITORING, ED
Glucose-Capillary: >600 mg/dL (ref 70–99)
Glucose-Capillary: >600 mg/dL (ref 70–99)

## 2021-12-02 LAB — GLUCOSE, CAPILLARY
Glucose-Capillary: >600 mg/dL (ref 70–99)
Glucose-Capillary: >600 mg/dL (ref 70–99)
Glucose-Capillary: 573 mg/dL (ref 70–99)
Glucose-Capillary: 524 mg/dL (ref 70–99)
Glucose-Capillary: 532 mg/dL (ref 70–99)
Glucose-Capillary: 508 mg/dL (ref 70–99)
Glucose-Capillary: 425 mg/dL — ABNORMAL HIGH (ref 70–99)
Glucose-Capillary: 490 mg/dL — ABNORMAL HIGH (ref 70–99)
Glucose-Capillary: 362 mg/dL — ABNORMAL HIGH (ref 70–99)

## 2021-12-02 LAB — CBC WITH DIFFERENTIAL/PLATELET
Abs Immature Granulocytes: 0.17 10*3/uL — ABNORMAL HIGH (ref 0.00–0.07)
Basophils Absolute: 0 10*3/uL (ref 0.0–0.1)
Basophils Relative: 0 %
Eosinophils Absolute: 0 10*3/uL (ref 0.0–0.5)
Eosinophils Relative: 0 %
HCT: 52.5 % — ABNORMAL HIGH (ref 39.0–52.0)
Hemoglobin: 16.7 g/dL (ref 13.0–17.0)
Immature Granulocytes: 1 %
Lymphocytes Relative: 5 %
Lymphs Abs: 0.8 10*3/uL (ref 0.7–4.0)
MCH: 29.3 pg (ref 26.0–34.0)
MCHC: 31.8 g/dL (ref 30.0–36.0)
MCV: 92.3 fL (ref 80.0–100.0)
Monocytes Absolute: 1.1 10*3/uL — ABNORMAL HIGH (ref 0.1–1.0)
Monocytes Relative: 6 %
Neutro Abs: 15.7 10*3/uL — ABNORMAL HIGH (ref 1.7–7.7)
Neutrophils Relative %: 88 %
Platelets: 406 10*3/uL — ABNORMAL HIGH (ref 150–400)
RBC: 5.69 MIL/uL (ref 4.22–5.81)
RDW: 15.5 % (ref 11.5–15.5)
WBC: 17.8 10*3/uL — ABNORMAL HIGH (ref 4.0–10.5)
nRBC: 0 % (ref 0.0–0.2)

## 2021-12-02 LAB — BLOOD GAS, VENOUS
Acid-base deficit: 11.4 mmol/L — ABNORMAL HIGH (ref 0.0–2.0)
Bicarbonate: 12.4 mmol/L — ABNORMAL LOW (ref 20.0–28.0)
O2 Saturation: 75.6 %
Patient temperature: 37
pCO2, Ven: 24 mmHg — ABNORMAL LOW (ref 44–60)
pH, Ven: 7.32 (ref 7.25–7.43)
pO2, Ven: 44 mmHg (ref 32–45)

## 2021-12-02 LAB — BASIC METABOLIC PANEL
Anion gap: 24 — ABNORMAL HIGH (ref 5–15)
Anion gap: 11 (ref 5–15)
BUN: 85 mg/dL — ABNORMAL HIGH (ref 6–20)
BUN: 90 mg/dL — ABNORMAL HIGH (ref 6–20)
CO2: 14 mmol/L — ABNORMAL LOW (ref 22–32)
CO2: 21 mmol/L — ABNORMAL LOW (ref 22–32)
Calcium: 8.3 mg/dL — ABNORMAL LOW (ref 8.9–10.3)
Calcium: 8.2 mg/dL — ABNORMAL LOW (ref 8.9–10.3)
Chloride: 101 mmol/L (ref 98–111)
Chloride: 108 mmol/L (ref 98–111)
Creatinine, Ser: 2.97 mg/dL — ABNORMAL HIGH (ref 0.61–1.24)
Creatinine, Ser: 2.41 mg/dL — ABNORMAL HIGH (ref 0.61–1.24)
GFR, Estimated: 24 mL/min — ABNORMAL LOW (ref >60)
GFR, Estimated: 31 mL/min — ABNORMAL LOW (ref >60)
Glucose, Bld: 775 mg/dL (ref 70–99)
Glucose, Bld: 457 mg/dL — ABNORMAL HIGH (ref 70–99)
Potassium: 4.2 mmol/L (ref 3.5–5.1)
Potassium: 4.4 mmol/L (ref 3.5–5.1)
Sodium: 139 mmol/L (ref 135–145)
Sodium: 140 mmol/L (ref 135–145)

## 2021-12-02 LAB — COMPREHENSIVE METABOLIC PANEL
ALT: 17 U/L (ref 0–44)
AST: 12 U/L — ABNORMAL LOW (ref 15–41)
Albumin: 3.5 g/dL (ref 3.5–5.0)
Alkaline Phosphatase: 100 U/L (ref 38–126)
Anion gap: 33 — ABNORMAL HIGH (ref 5–15)
BUN: 88 mg/dL — ABNORMAL HIGH (ref 6–20)
CO2: 12 mmol/L — ABNORMAL LOW (ref 22–32)
Calcium: 8.8 mg/dL — ABNORMAL LOW (ref 8.9–10.3)
Chloride: 94 mmol/L — ABNORMAL LOW (ref 98–111)
Creatinine, Ser: 3.18 mg/dL — ABNORMAL HIGH (ref 0.61–1.24)
GFR, Estimated: 22 mL/min — ABNORMAL LOW (ref >60)
Glucose, Bld: 1003 mg/dL (ref 70–99)
Potassium: 5.3 mmol/L — ABNORMAL HIGH (ref 3.5–5.1)
Sodium: 139 mmol/L (ref 135–145)
Total Bilirubin: 2.5 mg/dL — ABNORMAL HIGH (ref 0.3–1.2)
Total Protein: 7.5 g/dL (ref 6.5–8.1)

## 2021-12-02 LAB — URINALYSIS, ROUTINE W REFLEX MICROSCOPIC
Bacteria, UA: NONE SEEN
Bilirubin Urine: NEGATIVE
Glucose, UA: >=500 mg/dL — AB
Ketones, ur: 80 mg/dL — AB
Leukocytes,Ua: NEGATIVE
Nitrite: NEGATIVE
Protein, ur: 100 mg/dL — AB
Specific Gravity, Urine: 1.02 (ref 1.005–1.030)
pH: 5 (ref 5.0–8.0)

## 2021-12-02 LAB — HEMOGLOBIN AND HEMATOCRIT, BLOOD
HCT: 45.1 % (ref 39.0–52.0)
HCT: 43 % (ref 39.0–52.0)
Hemoglobin: 15.3 g/dL (ref 13.0–17.0)
Hemoglobin: 14.4 g/dL (ref 13.0–17.0)

## 2021-12-02 LAB — MRSA NEXT GEN BY PCR, NASAL: MRSA by PCR Next Gen: NOT DETECTED

## 2021-12-02 LAB — BETA-HYDROXYBUTYRIC ACID
Beta-Hydroxybutyric Acid: >8 mmol/L — ABNORMAL HIGH (ref 0.05–0.27)
Beta-Hydroxybutyric Acid: 1.99 mmol/L — ABNORMAL HIGH (ref 0.05–0.27)

## 2021-12-02 MED ORDER — LACTATED RINGERS IV SOLN: INTRAVENOUS | Status: DC

## 2021-12-02 MED ORDER — LACTATED RINGERS IV BOLUS: 20.0000 mL/kg | Freq: Once | INTRAVENOUS | Status: DC

## 2021-12-02 MED ORDER — PANTOPRAZOLE 80MG IVPB - SIMPLE MED
80.0000 mg | Freq: Once | INTRAVENOUS | Status: AC
  Administered 2021-12-02: 80 mg via INTRAVENOUS
  Filled 2021-12-02: qty 80

## 2021-12-02 MED ORDER — DEXTROSE 50 % IV SOLN: 0.0000 mL | INTRAVENOUS | Status: DC | PRN

## 2021-12-02 MED ORDER — PANTOPRAZOLE INFUSION (NEW) - SIMPLE MED
8.0000 mg/h | INTRAVENOUS | Status: DC
  Administered 2021-12-02 – 2021-12-03 (×2): 8 mg/h via INTRAVENOUS
  Filled 2021-12-02: qty 80
  Filled 2021-12-02: qty 100
  Filled 2021-12-02: qty 80

## 2021-12-02 MED ORDER — LACTATED RINGERS IV BOLUS
20.0000 mL/kg | Freq: Once | INTRAVENOUS | Status: AC
  Administered 2021-12-02: 1914 mL via INTRAVENOUS

## 2021-12-02 MED ORDER — DEXTROSE IN LACTATED RINGERS 5 % IV SOLN
INTRAVENOUS | Status: DC
  Administered 2021-12-03: 125 mL/h via INTRAVENOUS

## 2021-12-02 MED ORDER — CHLORHEXIDINE GLUCONATE CLOTH 2 % EX PADS
6.0000 | MEDICATED_PAD | Freq: Every day | CUTANEOUS | Status: DC
  Administered 2021-12-02 – 2021-12-04 (×2): 6 via TOPICAL

## 2021-12-02 MED ORDER — SUCRALFATE 1 GM/10ML PO SUSP
1.0000 g | Freq: Three times a day (TID) | ORAL | Status: DC
Start: 2021-12-02 — End: 2021-12-06
  Administered 2021-12-02 – 2021-12-06 (×12): 1 g via ORAL
  Filled 2021-12-02 (×12): qty 10

## 2021-12-02 MED ORDER — METOPROLOL TARTRATE 5 MG/5ML IV SOLN
5.0000 mg | Freq: Two times a day (BID) | INTRAVENOUS | Status: DC
  Administered 2021-12-02 – 2021-12-04 (×5): 5 mg via INTRAVENOUS
  Filled 2021-12-02 (×6): qty 5

## 2021-12-02 MED ORDER — ONDANSETRON HCL 4 MG/2ML IJ SOLN
4.0000 mg | Freq: Four times a day (QID) | INTRAMUSCULAR | Status: DC
  Administered 2021-12-02 – 2021-12-03 (×3): 4 mg via INTRAVENOUS
  Filled 2021-12-02 (×3): qty 2

## 2021-12-02 MED ORDER — PANTOPRAZOLE SODIUM 40 MG IV SOLR: 40.0000 mg | Freq: Once | INTRAVENOUS | Status: DC

## 2021-12-02 MED ORDER — LACTATED RINGERS IV BOLUS
1000.0000 mL | Freq: Once | INTRAVENOUS | Status: AC
  Administered 2021-12-02: 1000 mL via INTRAVENOUS

## 2021-12-02 MED ORDER — PANTOPRAZOLE SODIUM 40 MG IV SOLR: 40.0000 mg | Freq: Two times a day (BID) | INTRAVENOUS | Status: DC

## 2021-12-02 MED ORDER — INSULIN REGULAR(HUMAN) IN NACL 100-0.9 UT/100ML-% IV SOLN
INTRAVENOUS | Status: DC
  Administered 2021-12-02: 11.5 [IU]/h via INTRAVENOUS
  Administered 2021-12-02: 16 [IU]/h via INTRAVENOUS
  Filled 2021-12-02 (×2): qty 100

## 2021-12-02 NOTE — H&P (Signed)
?History and Physical  ? ? ?Patient: Andres Richardson WUJ:[id number] DOB: 1965/12/05 ?DOA: 12/02/2021 ?DOS: the patient was seen and examined on 12/02/2021 ?PCP: Mila PalmerWolters, Sharon, MD  ?Patient coming from: Home ? ?Chief Complaint:  ?Chief Complaint  ?Patient presents with  ? Hyperglycemia  ? Weakness  ? ?HPI: Andres Richardson is a 56 y.o. 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  He stated he has been compliant with his diabetes medications.No fever, sore throat, chills, diarrhea, flank pain, dysuria, frequency or hematuria.  No recent chest pain, dyspnea, palpitations, diaphoresis, pitting edema of the lower extremities, but has felt lightheaded. ? ?ED course: Initial vital signs were temperature 97.8 ?F, pulse 132, respirations 21, BP 140/96 mmHg O2 sat 100% on room air.  The patient received 2000 mL of LR bolus and was started on an insulin infusion per Endo tool. ? ?Lab work: Urinalysis showed ketonuria of 80, proteinuria of 100 and glucosuria more than 500 mg/dL.  There was small hemoglobinuria on chemical examination.  CMP showed sodium 139 (corrected to glucose level sodium was 161), potassium 5.3, chloride 94 and CO2 12 mmol/L. Glucose 1003, BUN 88 and creatinine 3.18 mg/dL.  Bilirubin was 2.5 mg/dL, but the rest of the LFTs were normal.  BHA was more than 8.0 mmol/dL.  Venous blood gas showed normal pH but with decreased bicarbonate and increased acid-base deficit. ?  ?Review of Systems: As mentioned in the history of present illness. All other systems  reviewed and are negative. ?Past Medical History:  ?Diagnosis Date  ? Diabetes mellitus without complication (HCC)   ? Type !! 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 Diabetic neuropathy.  ? Hyperlipidemia   ? Mixed  ? Hypertension   ? Essential  ? Microalbuminuria   ? Obesity   ? Peripheral arterial disease (HCC)   ? With Claudication  ? Tobacco use disorder   ? ?Past Surgical History:  ?Procedure Laterality Date  ? INCISION AND DRAINAGE ABSCESS N/A 02/24/2021  ? Procedure: INCISION AND DRAINAGE PERINEUM;  Surgeon: Kinsinger, De BlanchLuke Aaron, MD;  Location: [facility];  Service: General;  Laterality: N/A;  ? INCISION AND DRAINAGE ABSCESS N/A 02/25/2021  ? Procedure: REPEAT WASHOUT OF PERINEUM;  Surgeon: Kinsinger, De BlanchLuke Aaron, MD;  Location: [facility];  Service: General;  Laterality: N/A;  ? LAPAROSCOPIC DIVERTED COLOSTOMY N/A 02/25/2021  ? Procedure: LAPAROSCOPIC DIVERTING SIGMOID COLOSTOMY;  Surgeon: Kinsinger, De BlanchLuke Aaron, MD;  Location: [facility];  Service: General;  Laterality: N/A;  ? LEFT HEART CATH AND CORONARY ANGIOGRAPHY N/A 05/18/2021  ? Procedure: LEFT HEART CATH AND CORONARY ANGIOGRAPHY;  Surgeon: Runell GessBerry, Jonathan J, MD;  Location: [facility];  Service: Cardiovascular;  Laterality: N/A;  ? ?Social History:  reports that he quit smoking about 9 months ago. His smoking use included cigarettes. He smoked an average of 1 pack per day. He has never used smokeless tobacco. He reports that he does not drink alcohol and does not use drugs. ? ?Allergies  ?Allergen Reactions  ? Adhesive [Tape] Rash  ? ? ?Family History  ?Problem Relation Age of Onset  ? CAD Mother   ? Colon cancer Father   ? ? ?  Prior to Admission medications   ?Medication Sig Start Date End Date Taking? Authorizing Provider  ?acetaminophen (TYLENOL) 500 MG tablet Take 500 mg by mouth every 6 (six) hours as needed.    [provider]  ?aspirin 81 MG EC tablet Take 1 tablet (81 mg total) by mouth daily. Swallow whole. 11/11/21   Parke Poisson, MD  ?atorvastatin (LIPITOR) 40 MG tablet Take 1 tablet (40 mg total) by mouth daily. 11/11/21   Parke Poisson, MD  ?empagliflozin (JARDIANCE) 10 MG TABS tablet Take 10 mg by mouth daily in the afternoon. 08/03/21   [provider]  ?furosemide (LASIX) 40 MG tablet Take 1 tablet (40 mg total) by mouth daily. 11/11/21   Parke Poisson, MD  ?glipiZIDE (GLUCOTROL XL) 5 MG 24 hr tablet Take 1 tablet (5 mg total) by mouth daily. ?Patient not taking: Reported on 10/28/2021 03/10/21   Almon Hercules, MD  ?insulin lispro (HUMALOG) 100 UNIT/ML KwikPen 10 units 06/01/21   [provider]  ?insulin NPH-regular Human (NOVOLIN 70/30) (70-30) 100 UNIT/ML injection Inject 15 Units into the skin 2 (two) times daily with a meal. 03/10/21   Almon Hercules, MD  ?Iron, Ferrous Sulfate, 325 (65 Fe) MG TABS 1 tablet 06/02/21   [provider]  ?isosorbide mononitrate (IMDUR) 30 MG 24 hr tablet Take 1 tablet (30 mg total) by mouth daily. 11/11/21   Parke Poisson, MD  ?metFORMIN (GLUCOPHAGE-XR) 750 MG 24 hr tablet Take 1 tablet (750 mg total) by mouth 2 (two) times daily. 03/10/21   Almon Hercules, MD  ?metoprolol succinate (TOPROL-XL) 25 MG 24 hr tablet Take 1 tablet (25 mg total) by mouth daily. 07/08/21   Marcelino Duster, PA  ?Multiple Vitamin (MULTIVITAMIN WITH MINERALS) TABS tablet Take 1 tablet by mouth daily. 03/10/21   Almon Hercules, MD  ?nitroGLYCERIN (NITROSTAT) 0.4 MG SL tablet Place 1 tablet (0.4 mg total) under the tongue every 5 (five) minutes as needed for chest pain. ?Patient not taking: Reported on 08/07/2021 06/17/21 09/15/21  Beatriz Stallion., PA-C  ?OVER THE COUNTER MEDICATION Take 1 tablet by mouth every other day. Leg Cramps Take 1 tablet every other day    [provider]  ? ? ?Physical Exam: ?Vitals:  ? 12/02/21 1431 12/02/21 1513 12/02/21 1550 12/02/21 1600  ?BP:  116/79 131/78 121/73  ?Pulse:  (!) 132 (!) 123   ?Resp:  18 18 20   ?Temp:    99.1 ?F (37.3 ?C)  ?TempSrc:     Oral  ?SpO2:  100% 95% 99%  ?Weight: 95.7 kg 96.2 kg    ?Height: 5\' 11"  (1.803 m) 5\' 11"  (1.803 m)    ? ?Physical Exam ?Vitals and nursing note reviewed.  ?Constitutional:   ?   Appearance: Normal appearance. He is obese.  ?HENT:  ?   Head: Normocephalic.  ?   Mouth/Throat:  ?   Mouth: Mucous membranes are dry.  ?Eyes:  ?   General: No scleral icterus. ?   Pupils: Pupils are equal, round, and reactive to light.  ?Neck:  ?   Vascular: No JVD.  ?Cardiovascular:  ?   Rate and Rhythm: Regular rhythm. Tachycardia present.  ?   Heart sounds: S1 normal and S2 normal.  ?Pulmonary:  ?   Effort: Pulmonary effort is normal.  ?   Breath sounds: Normal breath sounds.  ?Abdominal:  ?   General: The ostomy site is clean. Bowel sounds are normal. There is  no distension.  ?   Palpations: Abdomen is soft.  ?   Tenderness: There is no abdominal tenderness.  ?Musculoskeletal:  ?   Cervical back: Neck supple.  ?   Right lower leg: No edema.  ?   Left lower leg: No edema.  ?Skin: ?   General: Skin is warm.  ?Neurological:  ?   General: No focal deficit present.  ?   Mental Status: He is alert and oriented to person, place, and time.  ?Psychiatric:     ?   Mood and Affect: Mood normal.  ? ?Echocardiogram report September/2022 ?IMPRESSIONS: ? ? 1. Left ventricular ejection fraction, by estimation, is 50 to 55%. The  ?left ventricle has low normal function. The left ventricle demonstrates  ?regional wall motion abnormalities (see scoring diagram/findings for  ?description). There is moderate left  ?ventricular hypertrophy. Left ventricular diastolic parameters are  ?consistent with Grade I diastolic dysfunction (impaired relaxation). There  ?is moderate hypokinesis of the left ventricular, basal inferior wall and  ?inferolateral wall.  ? 2. Right ventricular systolic function is normal. The right ventricular  ?size is normal. There is normal pulmonary artery systolic pressure. The  ?estimated right ventricular systolic pressure is 31.4  mmHg.  ? 3. The mitral valve is grossly normal. Trivial mitral valve  ?regurgitation.  ? 4. The aortic valve was not well visualized. Aortic valve regurgitation  ?is trivial.  ? 5. The inferior vena cava is di

## 2021-12-02 NOTE — ED Provider Notes (Signed)
?[facility] DEPT ?Provider Note ? ? ?CSN: [id number] ?Arrival date & time: 12/02/21  1335 ? ?  ? ?History ? ?Chief Complaint  ?Patient presents with  ? Hyperglycemia  ? Weakness  ? ? ?Andres Richardson is a 56 y.o. male. ? ?The history is provided by the patient, the EMS personnel and medical records. No language interpreter was used.  ?Hyperglycemia ?Associated symptoms: weakness   ?Weakness ? ?56 year old male significant history of diabetes with prior DKA's, CAD, CHF, hypertension, tobacco abuse brought here via EMS from home with complaints of generalized weakness.  Patient report for the past 2 days he has been feeling very weak and tired.  He also endorsed feeling very thirsty, and increased urination.  Patient states he feels very nauseous and unable to keep anything down.  Symptoms similar to prior DKA that he had most recent was a year ago.  He is unsure of anything that may have precipitated this condition.  States that he has been compliant with his medication and denies any recent sickness.  He denies having fever, headache, chest pain, trouble breathing, productive cough, abdominal pain, dysuria, diarrhea or constipation.  He does have a colostomy.  He has history of prior Fournier's gangrene.  Patient denies alcohol abuse or recreational drug use.  Admits to tobacco use. ? ?Home Medications ?Prior to Admission medications   ?Medication Sig Start Date End Date Taking? Authorizing Provider  ?acetaminophen (TYLENOL) 500 MG tablet Take 500 mg by mouth every 6 (six) hours as needed.    [provider]  ?aspirin 81 MG EC tablet Take 1 tablet (81 mg total) by mouth daily. Swallow whole. 11/11/21   Elouise Munroe, MD  ?atorvastatin (LIPITOR) 40 MG tablet Take 1 tablet (40 mg total) by mouth daily. 11/11/21   Elouise Munroe, MD  ?empagliflozin (JARDIANCE) 10 MG TABS tablet Take 10 mg by mouth daily in the afternoon. 08/03/21   [provider]  ?furosemide  (LASIX) 40 MG tablet Take 1 tablet (40 mg total) by mouth daily. 11/11/21   Elouise Munroe, MD  ?glipiZIDE (GLUCOTROL XL) 5 MG 24 hr tablet Take 1 tablet (5 mg total) by mouth daily. ?Patient not taking: Reported on 10/28/2021 03/10/21   Mercy Riding, MD  ?insulin lispro (HUMALOG) 100 UNIT/ML KwikPen 10 units 06/01/21   [provider]  ?insulin NPH-regular Human (NOVOLIN 70/30) (70-30) 100 UNIT/ML injection Inject 15 Units into the skin 2 (two) times daily with a meal. 03/10/21   Mercy Riding, MD  ?Iron, Ferrous Sulfate, 325 (65 Fe) MG TABS 1 tablet 06/02/21   [provider]  ?isosorbide mononitrate (IMDUR) 30 MG 24 hr tablet Take 1 tablet (30 mg total) by mouth daily. 11/11/21   Elouise Munroe, MD  ?metFORMIN (GLUCOPHAGE-XR) 750 MG 24 hr tablet Take 1 tablet (750 mg total) by mouth 2 (two) times daily. 03/10/21   Mercy Riding, MD  ?metoprolol succinate (TOPROL-XL) 25 MG 24 hr tablet Take 1 tablet (25 mg total) by mouth daily. 07/08/21   Ledora Bottcher, PA  ?Multiple Vitamin (MULTIVITAMIN WITH MINERALS) TABS tablet Take 1 tablet by mouth daily. 03/10/21   Mercy Riding, MD  ?nitroGLYCERIN (NITROSTAT) 0.4 MG SL tablet Place 1 tablet (0.4 mg total) under the tongue every 5 (five) minutes as needed for chest pain. ?Patient not taking: Reported on 08/07/2021 06/17/21 09/15/21  Abigail Butts., PA-C  ?OVER THE COUNTER MEDICATION Take 1 tablet by mouth every other  day. Leg Cramps Take 1 tablet every other day    [provider]  ?   ? ?Allergies    ?Adhesive [tape]   ? ?Review of Systems   ?Review of Systems  ?Neurological:  Positive for weakness.  ?All other systems reviewed and are negative. ? ?Physical Exam ?Updated Vital Signs ?BP (!) 140/96   Pulse (!) 132   Temp 97.8 ?F (36.6 ?C) (Oral)   Resp 19   SpO2 100%  ?Physical Exam ?Vitals and nursing note reviewed.  ?Constitutional:   ?   Appearance: He is well-developed. He is ill-appearing.  ?   Comments: Ill-appearing male   ?HENT:  ?   Head: Atraumatic.  ?   Mouth/Throat:  ?   Mouth: Mucous membranes are dry.  ?Eyes:  ?   Conjunctiva/sclera: Conjunctivae normal.  ?Cardiovascular:  ?   Rate and Rhythm: Tachycardia present.  ?   Pulses: Normal pulses.  ?   Heart sounds: Normal heart sounds.  ?Pulmonary:  ?   Effort: Pulmonary effort is normal.  ?   Breath sounds: Normal breath sounds. No wheezing, rhonchi or rales.  ?Abdominal:  ?   Palpations: Abdomen is soft.  ?   Tenderness: There is no abdominal tenderness.  ?   Comments: Colostomy bag noted with stool in bag  ?Musculoskeletal:  ?   Cervical back: Normal range of motion and neck supple.  ?Skin: ?   General: Skin is dry.  ?   Findings: No rash.  ?Neurological:  ?   Mental Status: He is alert.  ? ? ?ED Results / Procedures / Treatments   ?Labs ?(all labs ordered are listed, but only abnormal results are displayed) ?Labs Reviewed  ?CBC WITH DIFFERENTIAL/PLATELET - Abnormal; Notable for the following components:  ?    Result Value  ? WBC 17.8 (*)   ? HCT 52.5 (*)   ? Platelets 406 (*)   ? Neutro Abs 15.7 (*)   ? Monocytes Absolute 1.1 (*)   ? Abs Immature Granulocytes 0.17 (*)   ? All other components within normal limits  ?COMPREHENSIVE METABOLIC PANEL - Abnormal; Notable for the following components:  ? Potassium 5.3 (*)   ? Chloride 94 (*)   ? CO2 12 (*)   ? Glucose, Bld 1,003 (*)   ? BUN 88 (*)   ? Creatinine, Ser 3.18 (*)   ? Calcium 8.8 (*)   ? AST 12 (*)   ? Total Bilirubin 2.5 (*)   ? GFR, Estimated 22 (*)   ? Anion gap 33 (*)   ? All other components within normal limits  ?BLOOD GAS, VENOUS - Abnormal; Notable for the following components:  ? pCO2, Ven 24 (*)   ? Bicarbonate 12.4 (*)   ? Acid-base deficit 11.4 (*)   ? All other components within normal limits  ?CBG MONITORING, ED - Abnormal; Notable for the following components:  ? Glucose-Capillary >600 (*)   ? All other components within normal limits  ?BASIC METABOLIC PANEL  ?BASIC METABOLIC PANEL  ?BASIC METABOLIC PANEL   ?BETA-HYDROXYBUTYRIC ACID  ?BETA-HYDROXYBUTYRIC ACID  ?URINALYSIS, ROUTINE W REFLEX MICROSCOPIC  ? ? ?EKG ?None ?ED ECG REPORT ? ? Date: 12/02/2021 ? Rate: 132 ? Rhythm: sinus tachycardia ? QRS Axis: normal ? Intervals: normal ? ST/T Wave abnormalities: early repolarization ? Conduction Disutrbances:none ? Narrative Interpretation:  ? Old EKG Reviewed: changes noted ? ?I have personally reviewed the EKG tracing and agree with the computerized printout as noted. ? ? ?  Radiology ?No results found. ? ?Procedures ?Marland KitchenCritical Care ?Performed by: Domenic Moras, PA-C ?Authorized by: Domenic Moras, PA-C  ? ?Critical care provider statement:  ?  Critical care time (minutes):  45 ?  Critical care was time spent personally by me on the following activities:  Development of treatment plan with patient or surrogate, discussions with consultants, evaluation of patient's response to treatment, examination of patient, ordering and review of laboratory studies, ordering and review of radiographic studies, ordering and performing treatments and interventions, pulse oximetry, re-evaluation of patient's condition and review of old charts  ? ? ?Medications Ordered in ED ?Medications  ?pantoprazole (PROTONIX) 80 mg /NS 100 mL IVPB (has no administration in time range)  ?pantoprozole (PROTONIX) 80 mg /NS 100 mL infusion (has no administration in time range)  ?pantoprazole (PROTONIX) injection 40 mg (has no administration in time range)  ?lactated ringers bolus 1,914 mL (1,914 mLs Intravenous New Bag/Given 12/02/21 1433)  ? ? ?ED Course/ Medical Decision Making/ A&P ?  ?                        ?Medical Decision Making ?Amount and/or Complexity of Data Reviewed ?Labs: ordered. ? ? ?BP (!) 140/96   Pulse (!) 132   Temp 97.8 ?F (36.6 ?C) (Oral)   Resp 19   Ht 5\' 11"  (1.803 m)   Wt 95.7 kg   SpO2 100%   BMI 29.43 kg/m?  ? ?2:31 PM ?This is a 56 year old male with history of insulin-dependent diabetes presenting with complaints of generalized  weakness.  He was brought here via EMS.  EMS noted that he was hypoglycemic with CBG greater than 600.  He has known history of DKA in the past.  At this time he exhibits symptoms suggestive of DKA or HNK.  Vitals notable f

## 2021-12-02 NOTE — ED Notes (Signed)
CBG 1,003. PA Bowie notified.  ?
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
## 2021-12-02 NOTE — ED Triage Notes (Signed)
Pt BIB EMS from home, c/o nausea/vomit, and generalized weakness since Monday. Has hx of DKA, presented with CBG 526. Denies pain or SOB ? ?BP 116/60 ?P 130 ?RR 20 ?spO2 97% RA ?

## 2021-12-02 NOTE — ED Provider Notes (Signed)
?  Physical Exam  ?BP (!) 140/96   Pulse (!) 132   Temp 97.8 ?F (36.6 ?C) (Oral)   Resp 19   Ht 5\' 11"  (1.803 m)   Wt 95.7 kg   SpO2 100%   BMI 29.43 kg/m?  ? ?Physical Exam ? ?Procedures  ?Procedures ? ?ED Course / MDM  ?  ?Medical Decision Making ?Amount and/or Complexity of Data Reviewed ?Labs: ordered. ? ?Risk ?Prescription drug management. ? ? ?Beta hydroxybutyric acid greater than 8, CMP glucose 1003, potassium 5.3, anion gap 33, fattening 3.18 ? ?Discussed case with Dr. , hospitalist, who agreed to admit patient for further monitoring and insulin administration.  Patient with evidence of AKI, DKA ? ? ? ? ? ? ?  ?Robb Matar, PA-C ?12/02/21 1613 ? ?  ?12/04/21, MD ?12/12/21 1346 ? ?

## 2021-12-02 NOTE — Progress Notes (Signed)
Stoma was cleansed and assessed when changing the ostomy bag. Stoma site is pink and intact.  ? ?No complications. Will request a consult with ostomy nurse.  ?

## 2021-12-03 ENCOUNTER — Observation Stay (HOSPITAL_COMMUNITY): Payer: Medicaid Other

## 2021-12-03 DIAGNOSIS — E1151 Type 2 diabetes mellitus with diabetic peripheral angiopathy without gangrene: Secondary | ICD-10-CM | POA: Diagnosis present

## 2021-12-03 DIAGNOSIS — K226 Gastro-esophageal laceration-hemorrhage syndrome: Secondary | ICD-10-CM | POA: Diagnosis present

## 2021-12-03 DIAGNOSIS — E1165 Type 2 diabetes mellitus with hyperglycemia: Secondary | ICD-10-CM

## 2021-12-03 DIAGNOSIS — Z933 Colostomy status: Secondary | ICD-10-CM

## 2021-12-03 DIAGNOSIS — E875 Hyperkalemia: Secondary | ICD-10-CM | POA: Diagnosis present

## 2021-12-03 DIAGNOSIS — K2211 Ulcer of esophagus with bleeding: Secondary | ICD-10-CM | POA: Diagnosis not present

## 2021-12-03 DIAGNOSIS — Z794 Long term (current) use of insulin: Secondary | ICD-10-CM | POA: Diagnosis not present

## 2021-12-03 DIAGNOSIS — E663 Overweight: Secondary | ICD-10-CM | POA: Diagnosis present

## 2021-12-03 DIAGNOSIS — I2510 Atherosclerotic heart disease of native coronary artery without angina pectoris: Secondary | ICD-10-CM | POA: Diagnosis present

## 2021-12-03 DIAGNOSIS — E860 Dehydration: Secondary | ICD-10-CM | POA: Diagnosis present

## 2021-12-03 DIAGNOSIS — R17 Unspecified jaundice: Secondary | ICD-10-CM | POA: Diagnosis present

## 2021-12-03 DIAGNOSIS — K3189 Other diseases of stomach and duodenum: Secondary | ICD-10-CM | POA: Diagnosis not present

## 2021-12-03 DIAGNOSIS — K264 Chronic or unspecified duodenal ulcer with hemorrhage: Secondary | ICD-10-CM | POA: Diagnosis not present

## 2021-12-03 DIAGNOSIS — Z8249 Family history of ischemic heart disease and other diseases of the circulatory system: Secondary | ICD-10-CM | POA: Diagnosis not present

## 2021-12-03 DIAGNOSIS — Z7982 Long term (current) use of aspirin: Secondary | ICD-10-CM | POA: Diagnosis not present

## 2021-12-03 DIAGNOSIS — Z79899 Other long term (current) drug therapy: Secondary | ICD-10-CM | POA: Diagnosis not present

## 2021-12-03 DIAGNOSIS — K2101 Gastro-esophageal reflux disease with esophagitis, with bleeding: Secondary | ICD-10-CM | POA: Diagnosis not present

## 2021-12-03 DIAGNOSIS — E782 Mixed hyperlipidemia: Secondary | ICD-10-CM | POA: Diagnosis present

## 2021-12-03 DIAGNOSIS — Z9049 Acquired absence of other specified parts of digestive tract: Secondary | ICD-10-CM | POA: Diagnosis not present

## 2021-12-03 DIAGNOSIS — E870 Hyperosmolality and hypernatremia: Secondary | ICD-10-CM | POA: Diagnosis present

## 2021-12-03 DIAGNOSIS — E1110 Type 2 diabetes mellitus with ketoacidosis without coma: Secondary | ICD-10-CM | POA: Diagnosis present

## 2021-12-03 DIAGNOSIS — Z91048 Other nonmedicinal substance allergy status: Secondary | ICD-10-CM | POA: Diagnosis not present

## 2021-12-03 DIAGNOSIS — K2210 Ulcer of esophagus without bleeding: Secondary | ICD-10-CM | POA: Diagnosis present

## 2021-12-03 DIAGNOSIS — I10 Essential (primary) hypertension: Secondary | ICD-10-CM | POA: Diagnosis present

## 2021-12-03 DIAGNOSIS — K269 Duodenal ulcer, unspecified as acute or chronic, without hemorrhage or perforation: Secondary | ICD-10-CM | POA: Diagnosis present

## 2021-12-03 DIAGNOSIS — E1140 Type 2 diabetes mellitus with diabetic neuropathy, unspecified: Secondary | ICD-10-CM | POA: Diagnosis present

## 2021-12-03 DIAGNOSIS — Z87891 Personal history of nicotine dependence: Secondary | ICD-10-CM | POA: Diagnosis not present

## 2021-12-03 DIAGNOSIS — N179 Acute kidney failure, unspecified: Secondary | ICD-10-CM | POA: Diagnosis present

## 2021-12-03 DIAGNOSIS — Z7984 Long term (current) use of oral hypoglycemic drugs: Secondary | ICD-10-CM | POA: Diagnosis not present

## 2021-12-03 LAB — CBC
HCT: 43.7 % (ref 39.0–52.0)
Hemoglobin: 14.5 g/dL (ref 13.0–17.0)
MCH: 29.7 pg (ref 26.0–34.0)
MCHC: 33.2 g/dL (ref 30.0–36.0)
MCV: 89.5 fL (ref 80.0–100.0)
Platelets: 291 10*3/uL (ref 150–400)
RBC: 4.88 MIL/uL (ref 4.22–5.81)
RDW: 15 % (ref 11.5–15.5)
WBC: 13.6 10*3/uL — ABNORMAL HIGH (ref 4.0–10.5)
nRBC: 0 % (ref 0.0–0.2)

## 2021-12-03 LAB — GLUCOSE, CAPILLARY
Glucose-Capillary: 153 mg/dL — ABNORMAL HIGH (ref 70–99)
Glucose-Capillary: 155 mg/dL — ABNORMAL HIGH (ref 70–99)
Glucose-Capillary: 155 mg/dL — ABNORMAL HIGH (ref 70–99)
Glucose-Capillary: 166 mg/dL — ABNORMAL HIGH (ref 70–99)
Glucose-Capillary: 167 mg/dL — ABNORMAL HIGH (ref 70–99)
Glucose-Capillary: 183 mg/dL — ABNORMAL HIGH (ref 70–99)
Glucose-Capillary: 196 mg/dL — ABNORMAL HIGH (ref 70–99)
Glucose-Capillary: 203 mg/dL — ABNORMAL HIGH (ref 70–99)
Glucose-Capillary: 211 mg/dL — ABNORMAL HIGH (ref 70–99)
Glucose-Capillary: 222 mg/dL — ABNORMAL HIGH (ref 70–99)
Glucose-Capillary: 249 mg/dL — ABNORMAL HIGH (ref 70–99)
Glucose-Capillary: 266 mg/dL — ABNORMAL HIGH (ref 70–99)
Glucose-Capillary: 267 mg/dL — ABNORMAL HIGH (ref 70–99)
Glucose-Capillary: 295 mg/dL — ABNORMAL HIGH (ref 70–99)

## 2021-12-03 LAB — BASIC METABOLIC PANEL
Anion gap: 7 (ref 5–15)
BUN: 80 mg/dL — ABNORMAL HIGH (ref 6–20)
CO2: 27 mmol/L (ref 22–32)
Calcium: 8.5 mg/dL — ABNORMAL LOW (ref 8.9–10.3)
Chloride: 112 mmol/L — ABNORMAL HIGH (ref 98–111)
Creatinine, Ser: 1.9 mg/dL — ABNORMAL HIGH (ref 0.61–1.24)
GFR, Estimated: 41 mL/min — ABNORMAL LOW (ref >60)
Glucose, Bld: 158 mg/dL — ABNORMAL HIGH (ref 70–99)
Potassium: 3.9 mmol/L (ref 3.5–5.1)
Sodium: 146 mmol/L — ABNORMAL HIGH (ref 135–145)

## 2021-12-03 LAB — COMPREHENSIVE METABOLIC PANEL
ALT: 13 U/L (ref 0–44)
AST: 14 U/L — ABNORMAL LOW (ref 15–41)
Albumin: 2.9 g/dL — ABNORMAL LOW (ref 3.5–5.0)
Alkaline Phosphatase: 77 U/L (ref 38–126)
Anion gap: 10 (ref 5–15)
BUN: 87 mg/dL — ABNORMAL HIGH (ref 6–20)
CO2: 24 mmol/L (ref 22–32)
Calcium: 8.4 mg/dL — ABNORMAL LOW (ref 8.9–10.3)
Chloride: 111 mmol/L (ref 98–111)
Creatinine, Ser: 1.99 mg/dL — ABNORMAL HIGH (ref 0.61–1.24)
GFR, Estimated: 39 mL/min — ABNORMAL LOW (ref >60)
Glucose, Bld: 178 mg/dL — ABNORMAL HIGH (ref 70–99)
Potassium: 4.1 mmol/L (ref 3.5–5.1)
Sodium: 145 mmol/L (ref 135–145)
Total Bilirubin: 0.9 mg/dL (ref 0.3–1.2)
Total Protein: 6.2 g/dL — ABNORMAL LOW (ref 6.5–8.1)

## 2021-12-03 LAB — HEMOGLOBIN A1C
Hgb A1c MFr Bld: 12.6 % — ABNORMAL HIGH (ref 4.8–5.6)
Mean Plasma Glucose: 315 mg/dL

## 2021-12-03 LAB — PHOSPHORUS: Phosphorus: 3.5 mg/dL (ref 2.5–4.6)

## 2021-12-03 LAB — SODIUM, URINE, RANDOM: Sodium, Ur: <10 mmol/L

## 2021-12-03 LAB — OSMOLALITY, URINE: Osmolality, Ur: 625 mOsm/kg (ref 300–900)

## 2021-12-03 LAB — BETA-HYDROXYBUTYRIC ACID: Beta-Hydroxybutyric Acid: 0.61 mmol/L — ABNORMAL HIGH (ref 0.05–0.27)

## 2021-12-03 LAB — CREATININE, URINE, RANDOM: Creatinine, Urine: 92.91 mg/dL

## 2021-12-03 LAB — MAGNESIUM: Magnesium: 2.3 mg/dL (ref 1.7–2.4)

## 2021-12-03 MED ORDER — ACETAMINOPHEN 650 MG RE SUPP: 650.0000 mg | Freq: Four times a day (QID) | RECTAL | Status: DC | PRN

## 2021-12-03 MED ORDER — ONDANSETRON HCL 4 MG/2ML IJ SOLN
4.0000 mg | Freq: Four times a day (QID) | INTRAMUSCULAR | Status: DC | PRN
  Administered 2021-12-03 (×2): 4 mg via INTRAVENOUS
  Filled 2021-12-03 (×2): qty 2

## 2021-12-03 MED ORDER — ONDANSETRON HCL 4 MG PO TABS: 4.0000 mg | ORAL_TABLET | Freq: Four times a day (QID) | ORAL | Status: DC | PRN

## 2021-12-03 MED ORDER — LACTATED RINGERS IV SOLN: INTRAVENOUS | Status: DC

## 2021-12-03 MED ORDER — INSULIN GLARGINE-YFGN 100 UNIT/ML ~~LOC~~ SOLN
5.0000 [IU] | Freq: Once | SUBCUTANEOUS | Status: AC
  Administered 2021-12-03: 5 [IU] via SUBCUTANEOUS
  Filled 2021-12-03: qty 0.05

## 2021-12-03 MED ORDER — PANTOPRAZOLE SODIUM 40 MG IV SOLR
40.0000 mg | Freq: Two times a day (BID) | INTRAVENOUS | Status: DC
  Administered 2021-12-03 – 2021-12-06 (×6): 40 mg via INTRAVENOUS
  Filled 2021-12-03 (×6): qty 10

## 2021-12-03 MED ORDER — INSULIN ASPART 100 UNIT/ML IJ SOLN
0.0000 [IU] | INTRAMUSCULAR | Status: DC
  Administered 2021-12-03: 5 [IU] via SUBCUTANEOUS
  Administered 2021-12-03 (×2): 3 [IU] via SUBCUTANEOUS
  Administered 2021-12-03: 5 [IU] via SUBCUTANEOUS
  Administered 2021-12-04 (×3): 3 [IU] via SUBCUTANEOUS
  Administered 2021-12-04 – 2021-12-05 (×3): 5 [IU] via SUBCUTANEOUS
  Administered 2021-12-05: 7 [IU] via SUBCUTANEOUS
  Administered 2021-12-05: 2 [IU] via SUBCUTANEOUS
  Administered 2021-12-05: 1 [IU] via SUBCUTANEOUS
  Administered 2021-12-05: 3 [IU] via SUBCUTANEOUS
  Administered 2021-12-05 – 2021-12-06 (×2): 2 [IU] via SUBCUTANEOUS

## 2021-12-03 MED ORDER — INSULIN GLARGINE-YFGN 100 UNIT/ML ~~LOC~~ SOLN
25.0000 [IU] | Freq: Every day | SUBCUTANEOUS | Status: DC
  Administered 2021-12-05: 25 [IU] via SUBCUTANEOUS
  Filled 2021-12-03 (×2): qty 0.25

## 2021-12-03 MED ORDER — ACETAMINOPHEN 325 MG PO TABS
650.0000 mg | ORAL_TABLET | Freq: Four times a day (QID) | ORAL | Status: DC | PRN
  Administered 2021-12-03: 650 mg via ORAL
  Filled 2021-12-03: qty 2

## 2021-12-03 MED ORDER — INSULIN GLARGINE-YFGN 100 UNIT/ML ~~LOC~~ SOLN
20.0000 [IU] | Freq: Every day | SUBCUTANEOUS | Status: DC
  Administered 2021-12-03: 20 [IU] via SUBCUTANEOUS
  Filled 2021-12-03: qty 0.2

## 2021-12-03 MED ORDER — SENNOSIDES-DOCUSATE SODIUM 8.6-50 MG PO TABS: 1.0000 | ORAL_TABLET | Freq: Every evening | ORAL | Status: DC | PRN

## 2021-12-03 NOTE — Progress Notes (Addendum)
Inpatient Diabetes Program Recommendations ? ?AACE/ADA: New Consensus Statement on Inpatient Glycemic Control (2015) ? ?Target Ranges:  Prepandial:   less than 140 mg/dL ?     Peak postprandial:   less than 180 mg/dL (1-2 hours) ?     Critically ill patients:  140 - 180 mg/dL  ? ?Lab Results  ?Component Value Date  ? GLUCAP 203 (H) 12/03/2021  ? HGBA1C 6.8 (H) 05/13/2021  ? ? ?Review of Glycemic Control ? Latest Reference Range & Units 12/03/21 07:53 12/03/21 10:11 12/03/21 11:02  ?Glucose-Capillary 70 - 99 mg/dL 834 (H) 196 (H) 222 (H)  ?(H): Data is abnormally high ? ?Diabetes history: DM2 ?Outpatient Diabetes medications: 70/30 15 units BID, Jardiance 10 QD, Metformin 750 mg BID ?Current orders for Inpatient glycemic control: Semglee 20 units QD, Novolog 0-9 units Q4H ? ?Inpatient Diabetes Program Recommendations:   ? ?Novolog 0-15 units TID and HS (once eating) ?Novolog 3 units TID with meals if eats at least 50% ?Semglee 28 units AM (could administer 8 additional units now) ?Once eating-carb modified diet ? ?Spoke with patient at bedside.  He states he started getting sick on Monday.  He has not taken his insulin since then.  Presenting to the ED in DKA with BHB >8, Glucose 1003 mg/dL.  Confirms above home medications.  He normally administers insulin regularly.  He only injects on the side opposite of his colostomy.  Encouraged him to rotate to other areas such as back of arm or upper glut to prevent scar tissue and malabsorption of insulin.  He has been purchasing his 70/30 OTC at [facility].  He recently received insulin assistance through his PCP and will have a $0 copay.  He rarely checks his blood sugar.   ? ?Discussed importance of good glucose control to help prevent  long and short-term complications.  He tries not to drink beverages with sugar.  Educated on The Plate Method, CHO's and foods that contain CHO, importance of  limiting CHO's and close f/u with PCP.  He is current with [employer].    ? ?He states his A1C is usually 11-12%.  Reviewed goal A1C.  Current A1C is pending. ? ?Will continue to follow while inpatient. ? ?Thank you, ?Dulce Sellar, MSN, RN ?Diabetes Coordinator ?Inpatient Diabetes Program ?[phone] (team pager from 8a-5p) ? ?

## 2021-12-03 NOTE — H&P (View-Only) (Signed)
[facility] Progress Note ? ?Andres Richardson 56 y.o. 10/31/1965 ? ?CC:  Hematemesis ? ? ?Subjective: ?Patient seen and examined lying comfortably in bed.  He is tolerating clear liquid diet well.  Denies further episodes of nausea or vomiting.  Denies any black bloody stool in ostomy bag.  Denies abdominal pain.  ? ?ROS : Review of Systems  ?Constitutional:  Negative for chills and fever.  ?Gastrointestinal:  Negative for abdominal pain, blood in stool, constipation, diarrhea, heartburn, melena, nausea and vomiting.  ?Genitourinary:  Negative for dysuria, frequency and hematuria.  ? ? ? ?Objective: ?Vital signs in last 24 hours: ?Vitals:  ? 12/03/21 0904 12/03/21 1000  ?BP: 100/65 113/73  ?Pulse: (!) 122 100  ?Resp: (!) 26 20  ?Temp:    ?SpO2: 94% 100%  ? ? ?Physical Exam: ? ?General:  Alert, cooperative, no distress, appears stated age  ?Head:  Normocephalic, without obvious abnormality, atraumatic  ?Eyes:  Anicteric sclera, EOM's intact  ?Lungs:   Clear to auscultation bilaterally, respirations unlabored  ?Heart:  Regular rate and rhythm, S1, S2 normal  ?Abdomen:   Soft, non-tender, bowel sounds active all four quadrants,  no masses, ostomy bag with loose brown stool contents.  ?Extremities: Extremities normal, atraumatic, no  edema  ?Pulses: 2+ and symmetric  ? ? ?Lab Results: ?Recent Labs  ?  12/03/21 ?0229 12/03/21 ?0604  ?NA 145 146*  ?K 4.1 3.9  ?CL 111 112*  ?CO2 24 27  ?GLUCOSE 178* 158*  ?BUN 87* 80*  ?CREATININE 1.99* 1.90*  ?CALCIUM 8.4* 8.5*  ?MG 2.3  --   ?PHOS 3.5  --   ? ?Recent Labs  ?  12/02/21 ?1349 12/03/21 ?0229  ?AST 12* 14*  ?ALT 17 13  ?ALKPHOS 100 77  ?BILITOT 2.5* 0.9  ?PROT 7.5 6.2*  ?ALBUMIN 3.5 2.9*  ? ?Recent Labs  ?  12/02/21 ?1349 12/02/21 ?1757 12/02/21 ?2154 12/03/21 ?0229  ?WBC 17.8*  --   --  13.6*  ?NEUTROABS 15.7*  --   --   --   ?HGB 16.7   < > 14.4 14.5  ?HCT 52.5*   < > 43.0 43.7  ?MCV 92.3  --   --  89.5  ?PLT 406*  --   --  291  ? < > = values in this interval not  displayed.  ? ?No results for input(s): LABPROT, INR in the last 72 hours. ? ? ? ?Assessment ?DKA ?Hematemesis  ?  ?Gross blood in emesis, likely due to Mallory-Weiss tear from repeated emesis. Reported large volume bleeding.  ? ?Hemoglobin at 14.5 today after IV fluids.  ? ?HGB 14.5(16.7) Platelets 291(406) ?WBC 17.8(13.6) ?TBili 0.9 (2.5) ?Cr 1.9 (3.18) BUN 80 (88)  Glucose 158 (1003) K 3.9 (5.3) ? ?Plan: ?Plan for EGD tomorrow to evaluate for further upper GI bleeding. I thoroughly discussed the procedures to include nature, alternatives, benefits, and risks including but not limited to bleeding, perforation, infection, anesthesia/cardiac and pulmonary complications. Patient provides understanding and gave verbal consent to proceed. ?Continue Protonix IV 40mg BID ?Continue clear liquid diet ?NPO at midnight ?Continue anti-emetics and supportive care as needed. ?[facility] will follow.    ? ?Lorn Butcher N Staphany Ditton PA-C ?12/03/2021, 10:20 AM ? ?Contact #  [phone]  ?

## 2021-12-03 NOTE — Progress Notes (Signed)
[facility] Progress Note ? ?Andres Richardson 56 y.o. 1966-01-12 ? ?CC:  Hematemesis ? ? ?Subjective: ?Patient seen and examined lying comfortably in bed.  He is tolerating clear liquid diet well.  Denies further episodes of nausea or vomiting.  Denies any black bloody stool in ostomy bag.  Denies abdominal pain.  ? ?ROS : Review of Systems  ?Constitutional:  Negative for chills and fever.  ?Gastrointestinal:  Negative for abdominal pain, blood in stool, constipation, diarrhea, heartburn, melena, nausea and vomiting.  ?Genitourinary:  Negative for dysuria, frequency and hematuria.  ? ? ? ?Objective: ?Vital signs in last 24 hours: ?Vitals:  ? 12/03/21 0904 12/03/21 1000  ?BP: 100/65 113/73  ?Pulse: (!) 122 100  ?Resp: (!) 26 20  ?Temp:    ?SpO2: 94% 100%  ? ? ?Physical Exam: ? ?General:  Alert, cooperative, no distress, appears stated age  ?Head:  Normocephalic, without obvious abnormality, atraumatic  ?Eyes:  Anicteric sclera, EOM's intact  ?Lungs:   Clear to auscultation bilaterally, respirations unlabored  ?Heart:  Regular rate and rhythm, S1, S2 normal  ?Abdomen:   Soft, non-tender, bowel sounds active all four quadrants,  no masses, ostomy bag with loose brown stool contents.  ?Extremities: Extremities normal, atraumatic, no  edema  ?Pulses: 2+ and symmetric  ? ? ?Lab Results: ?Recent Labs  ?  12/03/21 ?0229 12/03/21 ?0604  ?NA 145 146*  ?K 4.1 3.9  ?CL 111 112*  ?CO2 24 27  ?GLUCOSE 178* 158*  ?BUN 87* 80*  ?CREATININE 1.99* 1.90*  ?CALCIUM 8.4* 8.5*  ?MG 2.3  --   ?PHOS 3.5  --   ? ?Recent Labs  ?  12/02/21 ?1349 12/03/21 ?0229  ?AST 12* 14*  ?ALT 17 13  ?ALKPHOS 100 77  ?BILITOT 2.5* 0.9  ?PROT 7.5 6.2*  ?ALBUMIN 3.5 2.9*  ? ?Recent Labs  ?  12/02/21 ?1349 12/02/21 ?1757 12/02/21 ?2154 12/03/21 ?[id number]  ?WBC 17.8*  --   --  13.6*  ?NEUTROABS 15.7*  --   --   --   ?HGB 16.7   < > 14.4 14.5  ?HCT 52.5*   < > 43.0 43.7  ?MCV 92.3  --   --  89.5  ?PLT 406*  --   --  291  ? < > = values in this interval not  displayed.  ? ?No results for input(s): LABPROT, INR in the last 72 hours. ? ? ? ?Assessment ?DKA ?Hematemesis  ?  ?Gross blood in emesis, likely due to Mallory-Weiss tear from repeated emesis. Reported large volume bleeding.  ? ?Hemoglobin at 14.5 today after IV fluids.  ? ?HGB 14.5(16.7) Platelets 291(406) ?WBC 17.8(13.6) ?TBili 0.9 (2.5) ?Cr 1.9 (3.18) BUN 80 (88)  Glucose 158 (1003) K 3.9 (5.3) ? ?Plan: ?Plan for EGD tomorrow to evaluate for further upper GI bleeding. I thoroughly discussed the procedures to include nature, alternatives, benefits, and risks including but not limited to bleeding, perforation, infection, anesthesia/cardiac and pulmonary complications. Patient provides understanding and gave verbal consent to proceed. ?Continue Protonix IV 40mg  BID ?Continue clear liquid diet ?NPO at midnight ?Continue anti-emetics and supportive care as needed. ?[facility] will follow.    ? ?Charlott Rakes PA-C ?12/03/2021, 10:20 AM ? ?Contact #  [phone]  ?

## 2021-12-03 NOTE — Progress Notes (Signed)
?Progress Note ?Patient: Andres Richardson YIF:[id number] DOB: 1966-08-16 DOA: 12/02/2021  ?DOS: the patient was seen and examined on 12/03/2021 ? ?Brief hospital course: ?BRAESON RUPE is a 56 y.o. 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   ?DKA has resolved.  Now on basal bolus regimen. ?EGD scheduled for tomorrow. ? ?Assessment and Plan: ?  DKA (diabetic ketoacidosis) (HCC) ?  Uncontrolled type 2 diabetes mellitus with hyperglycemia, with long-term current use of insulin (HCC) ?Presents with complaints of nausea and vomiting.  Work-up showed elevated anion gap, glucose of more than thousand, acute kidney injury, mild hyperkalemia as well as elevated.  Uric acid consistent with DKA. ?Patient was treated with IV fluids and IV insulin drip. ?Currently anion gap is closed the patient does not have any further nausea or vomiting. ?We will advance diet to clear liquid diet and provide the patient with basal bolus regimen insulin. ?Given lower dose of long-acting insulin as the patient is currently on liquid diet most likely will be n.p.o. after midnight. ?Continue every 4 hours sliding scale for the same reason. ?Resume home regimen after EGD. ? ?  Hematemesis ?Most likely Mallory-Weiss tear in the setting of 15-20 episodes of vomiting. ?Hemoglobin currently stable. ?GI consulted. ?EGD scheduled for 4/21.  Will monitor. ?Started on PPI infusion.  Continue PPI twice daily. ? ?  AKI (acute kidney injury) (HCC) ?  Hyperkalemia ?  Hypernatremia ?In the setting of DKA and  dehydration. ?Renal function still appears to be not back to baseline. ?Baseline is normal less than 1 serum creatinine. ?Ultrasound renal negative for any obstructive uropathy. ?FeNa 0.1 suggestive of renal etiology. ?Continue with IV hydration and monitor. ? ?  Hyperlipidemia ?  CAD (coronary artery disease) ?Currently holding home regimen.  Resume once stable. ? ?  Hyperbilirubinemia ?Monitor for now. ? ?  Colostomy in place [employer]) ?No evidence of blood in the stool. ?Monitor. ? ?Subjective: Denies any nausea vomiting or abdominal pain no blood in stool.  No fever no chills. ? ?Physical Exam: ?Vitals:  ? 12/03/21 1500 12/03/21 1600 12/03/21 1700 12/03/21 1800  ?BP: (!) 151/81 (!) 95/54 103/60 (!) 113/53  ?Pulse: 99 95 100 93  ?Resp: 10 16 16 16   ?Temp:  98 ?F (36.7 ?C)    ?TempSrc:  Oral    ?SpO2: 98% 93% 93% 96%  ?Weight:      ?Height:      ? ?General: Appear in mild distress; no visible Abnormal Neck Mass Or lumps, Conjunctiva normal ?Cardiovascular: S1 and S2 Present, no Murmur, ?Respiratory: good respiratory effort, Bilateral Air entry present and CTA, no Crackles, no wheezes ?Abdomen: Bowel Sound present, Non tender ?Extremities: no Pedal edema ?Neurology: alert and oriented to time, place, and person ?Gait not checked due to patient safety concerns  ? ?Data Reviewed: ?I have Reviewed nursing notes, Vitals, and Lab results since pt's last encounter. Pertinent lab results CBC and BMP ?I have ordered test including CBC and BMP ?I have reviewed the last note from GI,   ? ?Family Communication: Family at bedside, all questions answered. ? ?Disposition: ?Status is: Inpatient ?Remains inpatient appropriate because: Requiring Close observation for recurrence of  DKA as well as an EGD Scheduled tomorrow ? ?Author: ?Lynden Oxford, MD ?12/03/2021 7:15 PM ? ?Please look on [email or web address] to find out who is on call. ?

## 2021-12-03 NOTE — Consult Note (Signed)
WOC Nurse ostomy consult note ?Stoma type/location: LLQ colostomy, present for several months.  Manages at home, changes pouch twice weekly.   ?Stomal assessment/size: 1" but he wears 2 barrier rings and cuts hole to 1 3/4" for better fit.  Stoma in creasing at 3 and 9 o'clock.  I have left a belt in the room to wear when he feels better.  I am placing in 1 piece convex pouch with 2 barrier rings today.  Supplies left at bedside for additional pouch changes.  ?Peristomal assessment: Intact, stoma is low and near pelvic bone with skin creasing present ?Treatment options for stomal/peristomal skin: 2 barrier rings (stacked) ([facility] # [id number])  and 1 piece soft convex . [facility] # [id number])  Ostomy belt left in room at the sink as well.  ?Output none in pouch.  Removed flat 1 piece pouch with no ring as I feared this would leak.  ?Ostomy pouching: 1pc. Convex  with 2 barrier rings and belt. [facility] numbers listed above. ?Education provided: Inquired about Patient assistance and he does not think he enrolled in that.  HE is trying to get medicaid. I have reached out to [facility] to ask status of that.  I am leaving information about the ostomy clinic and would appreciate a referral so patient will have outpatient support going forward.  ?Enrolled patient in [employer] DC program: Yes previously  Not enrolled in patient assistance to help uninsured patients.  ?Will not follow at this time.  Please re-consult if needed.  ?Maple Hudson MSN, RN, FNP-BC CWON ?Wound, Ostomy, Continence Nurse ?Pager [phone]  ?

## 2021-12-04 ENCOUNTER — Inpatient Hospital Stay (HOSPITAL_COMMUNITY): Payer: Medicaid Other | Admitting: Anesthesiology

## 2021-12-04 ENCOUNTER — Encounter (HOSPITAL_COMMUNITY): Payer: Self-pay | Admitting: Internal Medicine

## 2021-12-04 ENCOUNTER — Encounter (HOSPITAL_COMMUNITY)
Admission: EM | Disposition: A | Discharge status: Home / Self Care | Payer: Self-pay | Source: Home / Self Care | Attending: Internal Medicine

## 2021-12-04 DIAGNOSIS — K3189 Other diseases of stomach and duodenum: Secondary | ICD-10-CM

## 2021-12-04 DIAGNOSIS — K2101 Gastro-esophageal reflux disease with esophagitis, with bleeding: Secondary | ICD-10-CM

## 2021-12-04 DIAGNOSIS — K2211 Ulcer of esophagus with bleeding: Secondary | ICD-10-CM

## 2021-12-04 DIAGNOSIS — K264 Chronic or unspecified duodenal ulcer with hemorrhage: Secondary | ICD-10-CM

## 2021-12-04 HISTORY — PX: BIOPSY: SHX5522

## 2021-12-04 HISTORY — PX: ESOPHAGOGASTRODUODENOSCOPY (EGD) WITH PROPOFOL: SHX5813

## 2021-12-04 LAB — BASIC METABOLIC PANEL
Anion gap: 5 (ref 5–15)
BUN: 39 mg/dL — ABNORMAL HIGH (ref 6–20)
CO2: 26 mmol/L (ref 22–32)
Calcium: 7.8 mg/dL — ABNORMAL LOW (ref 8.9–10.3)
Chloride: 109 mmol/L (ref 98–111)
Creatinine, Ser: 1.13 mg/dL (ref 0.61–1.24)
GFR, Estimated: >60 mL/min (ref >60)
Glucose, Bld: 231 mg/dL — ABNORMAL HIGH (ref 70–99)
Potassium: 3.4 mmol/L — ABNORMAL LOW (ref 3.5–5.1)
Sodium: 140 mmol/L (ref 135–145)

## 2021-12-04 LAB — CBC
HCT: 35.4 % — ABNORMAL LOW (ref 39.0–52.0)
Hemoglobin: 11.5 g/dL — ABNORMAL LOW (ref 13.0–17.0)
MCH: 29.7 pg (ref 26.0–34.0)
MCHC: 32.5 g/dL (ref 30.0–36.0)
MCV: 91.5 fL (ref 80.0–100.0)
Platelets: 163 10*3/uL (ref 150–400)
RBC: 3.87 MIL/uL — ABNORMAL LOW (ref 4.22–5.81)
RDW: 14.6 % (ref 11.5–15.5)
WBC: 7.7 10*3/uL (ref 4.0–10.5)
nRBC: 0 % (ref 0.0–0.2)

## 2021-12-04 LAB — GLUCOSE, CAPILLARY
Glucose-Capillary: 239 mg/dL — ABNORMAL HIGH (ref 70–99)
Glucose-Capillary: 201 mg/dL — ABNORMAL HIGH (ref 70–99)
Glucose-Capillary: 183 mg/dL — ABNORMAL HIGH (ref 70–99)
Glucose-Capillary: 238 mg/dL — ABNORMAL HIGH (ref 70–99)
Glucose-Capillary: 268 mg/dL — ABNORMAL HIGH (ref 70–99)

## 2021-12-04 LAB — UREA NITROGEN, URINE: Urea Nitrogen, Ur: 1130 mg/dL

## 2021-12-04 SURGERY — ESOPHAGOGASTRODUODENOSCOPY (EGD) WITH PROPOFOL
Anesthesia: Monitor Anesthesia Care

## 2021-12-04 MED ORDER — LACTATED RINGERS IV SOLN: INTRAVENOUS | Status: DC | PRN

## 2021-12-04 MED ORDER — METOPROLOL SUCCINATE ER 25 MG PO TB24
25.0000 mg | ORAL_TABLET | Freq: Every day | ORAL | Status: DC
  Administered 2021-12-04 – 2021-12-06 (×3): 25 mg via ORAL
  Filled 2021-12-04 (×3): qty 1

## 2021-12-04 MED ORDER — NITROGLYCERIN 0.4 MG SL SUBL: 0.4000 mg | SUBLINGUAL_TABLET | SUBLINGUAL | Status: DC | PRN

## 2021-12-04 MED ORDER — EMPAGLIFLOZIN 10 MG PO TABS
10.0000 mg | ORAL_TABLET | Freq: Every day | ORAL | Status: DC
  Administered 2021-12-04 – 2021-12-05 (×2): 10 mg via ORAL
  Filled 2021-12-04 (×3): qty 1

## 2021-12-04 MED ORDER — FUROSEMIDE 40 MG PO TABS
40.0000 mg | ORAL_TABLET | Freq: Every day | ORAL | Status: DC
  Administered 2021-12-04 – 2021-12-06 (×3): 40 mg via ORAL
  Filled 2021-12-04 (×3): qty 1

## 2021-12-04 MED ORDER — METFORMIN HCL ER 750 MG PO TB24: 750.0000 mg | ORAL_TABLET | Freq: Two times a day (BID) | ORAL | Status: DC

## 2021-12-04 MED ORDER — INSULIN LISPRO (1 UNIT DIAL) 100 UNIT/ML (KWIKPEN): 25.0000 [IU] | PEN_INJECTOR | Freq: Every day | SUBCUTANEOUS | Status: DC

## 2021-12-04 MED ORDER — POTASSIUM CHLORIDE 10 MEQ/100ML IV SOLN
10.0000 meq | INTRAVENOUS | Status: AC
  Administered 2021-12-04 (×2): 10 meq via INTRAVENOUS
  Filled 2021-12-04 (×2): qty 100

## 2021-12-04 MED ORDER — GLIPIZIDE ER 5 MG PO TB24: 5.0000 mg | ORAL_TABLET | Freq: Every day | ORAL | Status: DC

## 2021-12-04 MED ORDER — POTASSIUM CHLORIDE CRYS ER 20 MEQ PO TBCR: 40.0000 meq | EXTENDED_RELEASE_TABLET | Freq: Once | ORAL | Status: DC

## 2021-12-04 MED ORDER — ACETAMINOPHEN 500 MG PO TABS: 500.0000 mg | ORAL_TABLET | Freq: Four times a day (QID) | ORAL | Status: DC | PRN

## 2021-12-04 MED ORDER — FERROUS SULFATE 325 (65 FE) MG PO TABS
325.0000 mg | ORAL_TABLET | Freq: Every day | ORAL | Status: DC
  Administered 2021-12-04: 325 mg via ORAL
  Filled 2021-12-04 (×2): qty 1

## 2021-12-04 MED ORDER — PROPOFOL 500 MG/50ML IV EMUL
INTRAVENOUS | Status: AC
  Filled 2021-12-04: qty 50

## 2021-12-04 MED ORDER — LIDOCAINE 2% (20 MG/ML) 5 ML SYRINGE
INTRAMUSCULAR | Status: DC | PRN
Start: 2021-12-04 — End: 2021-12-04
  Administered 2021-12-04: 60 mg via INTRAVENOUS

## 2021-12-04 MED ORDER — ADULT MULTIVITAMIN W/MINERALS CH
1.0000 | ORAL_TABLET | Freq: Every day | ORAL | Status: DC
  Administered 2021-12-04 – 2021-12-06 (×3): 1 via ORAL
  Filled 2021-12-04 (×3): qty 1

## 2021-12-04 MED ORDER — ISOSORBIDE MONONITRATE ER 30 MG PO TB24
30.0000 mg | ORAL_TABLET | Freq: Every day | ORAL | Status: DC
  Administered 2021-12-04 – 2021-12-06 (×3): 30 mg via ORAL
  Filled 2021-12-04 (×3): qty 1

## 2021-12-04 MED ORDER — PROPOFOL 10 MG/ML IV BOLUS
INTRAVENOUS | Status: DC | PRN
  Administered 2021-12-04: 50 mg via INTRAVENOUS

## 2021-12-04 MED ORDER — ATORVASTATIN CALCIUM 40 MG PO TABS
40.0000 mg | ORAL_TABLET | Freq: Every day | ORAL | Status: DC
Start: 2021-12-04 — End: 2021-12-06
  Administered 2021-12-04 – 2021-12-06 (×3): 40 mg via ORAL
  Filled 2021-12-04 (×3): qty 1

## 2021-12-04 MED ORDER — ASPIRIN EC 81 MG PO TBEC
81.0000 mg | DELAYED_RELEASE_TABLET | Freq: Every day | ORAL | Status: DC
  Administered 2021-12-04 – 2021-12-06 (×3): 81 mg via ORAL
  Filled 2021-12-04 (×3): qty 1

## 2021-12-04 MED ORDER — PROPOFOL 1000 MG/100ML IV EMUL
INTRAVENOUS | Status: AC
  Filled 2021-12-04: qty 200

## 2021-12-04 MED ORDER — PROPOFOL 500 MG/50ML IV EMUL
INTRAVENOUS | Status: DC | PRN
  Administered 2021-12-04: 150 ug/kg/min via INTRAVENOUS

## 2021-12-04 MED ORDER — LACTATED RINGERS IV SOLN
INTRAVENOUS | Status: AC | PRN
Start: 2021-12-04 — End: 2021-12-04
  Administered 2021-12-04: 1000 mL via INTRAVENOUS

## 2021-12-04 SURGICAL SUPPLY — 15 items

## 2021-12-04 NOTE — Anesthesia Preprocedure Evaluation (Signed)
Anesthesia Evaluation  ?Patient identified by MRN, date of birth, ID band ?Patient awake ? ? ? ?Reviewed: ?Allergy & Precautions, NPO status , Patient's Chart, lab work & pertinent test results ? ?Airway ?Mallampati: II ? ?TM Distance: >3 FB ?Neck ROM: Full ? ? ? Dental ? ?(+) Dental Advisory Given ?  ?Pulmonary ?Patient abstained from smoking., former smoker,  ?  ?breath sounds clear to auscultation ? ? ? ? ? ? Cardiovascular ?hypertension, Pt. on medications and Pt. on home beta blockers ?+ CAD, + Peripheral Vascular Disease and +CHF  ? ?Rhythm:Regular Rate:Normal ? ? ?  ?Neuro/Psych ?negative neurological ROS ?   ? GI/Hepatic ?Neg liver ROS, GI bleed ?  ?Endo/Other  ?diabetes, Type 2, Insulin Dependent, Oral Hypoglycemic Agents ? Renal/GU ?ARFRenal disease  ? ?  ?Musculoskeletal ? ? Abdominal ?  ?Peds ? Hematology ? ?(+) Blood dyscrasia, anemia ,   ?Anesthesia Other Findings ? ? Reproductive/Obstetrics ? ?  ? ? ? ? ? ? ? ? ? ? ? ? ? ?  ?  ? ? ? ? ? ? ? ? ?Anesthesia Physical ?Anesthesia Plan ? ?ASA: 3 ? ?Anesthesia Plan: MAC  ? ?Post-op Pain Management: Minimal or no pain anticipated  ? ?Induction:  ? ?PONV Risk Score and Plan: 1 and Propofol infusion and Treatment may vary due to age or medical condition ? ?Airway Management Planned: Natural Airway and Nasal Cannula ? ?Additional Equipment:  ? ?Intra-op Plan:  ? ?Post-operative Plan:  ? ?Informed Consent: I have reviewed the patients History and Physical, chart, labs and discussed the procedure including the risks, benefits and alternatives for the proposed anesthesia with the patient or authorized representative who has indicated his/her understanding and acceptance.  ? ? ? ? ? ?Plan Discussed with:  ? ?Anesthesia Plan Comments:   ? ? ? ? ? ? ?Anesthesia Quick Evaluation ? ?

## 2021-12-04 NOTE — Progress Notes (Signed)
Patient had a visitor for an extended time yesterday - staff was made aware by this visitor that he began to feel ill and tested positive for COVID today.  IP consulted  - instructed to place patient on airbrone/contact precautions at this time.  No testing required at this time as it is likely it could be a false negative.  Would recommend testing at 3-5 days after exposure or if symptoms arise sooner. ?

## 2021-12-04 NOTE — Interval H&P Note (Signed)
History and Physical Interval Note: ?56/male with hematemesis, for EGD with propofol. ? ?12/04/2021 ?12:06 PM ? ?Andres Richardson  has presented today for EGD, with the diagnosis of hematemesis.  The various methods of treatment have been discussed with the patient and family. After consideration of risks, benefits and other options for treatment, the patient has consented to  Procedure(s): ?ESOPHAGOGASTRODUODENOSCOPY (EGD) WITH PROPOFOL (N/A) as a surgical intervention.  The patient's history has been reviewed, patient examined, no change in status, stable for surgery.  I have reviewed the patient's chart and labs.  Questions were answered to the patient's satisfaction.   ? ? ?Kerin Salen ? ? ?

## 2021-12-04 NOTE — Progress Notes (Signed)
?Progress Note ?Patient: Andres Richardson DOB: September 28, 1965 DOA: 12/02/2021  ?DOS: the patient was seen and examined on 12/04/2021 ? ?Brief hospital course: ?Andres Richardson is a 56 y.o. 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   ?DKA has resolved.  Now on basal bolus regimen. ?EGD shows esophageal and duodenal ulcer without any active bleeding. ?Assessment and Plan: ?  DKA (diabetic ketoacidosis) (HCC) ?  Uncontrolled type 2 diabetes mellitus with hyperglycemia, with long-term current use of insulin (HCC) ?Presents with complaints of nausea and vomiting.  Work-up showed elevated anion gap, glucose of more than thousand, acute kidney injury, mild hyperkalemia as well as elevated.  Uric acid consistent with DKA. ?Patient was treated with IV fluids and IV insulin drip. ?Currently anion gap is closed the patient does not have any further nausea or vomiting. ?Given lower dose of long-acting insulin as the patient is currently on liquid diet  ?Continue every 4 hours sliding scale for the same reason. ?Resume home regimen once the patient is able to tolerate p.o. ?  ?  Hematemesis ?Most likely Mallory-Weiss tear in the setting of 15-20 episodes of vomiting. ?Hemoglobin currently stable. ?GI consulted. ?EGD shows esophageal and duodenal ulcer without any active bleeding. ?Continue PPI twice daily for 2 months and Carafate. ?On full liquid diet due to difficulty swallowing. ? ?AKI (acute kidney injury) (HCC) ?  Hyperkalemia ?  Hypernatremia ?In the setting of DKA and  dehydration. ?Renal function still appears to be not back to baseline. ?Baseline is normal less than 1 serum creatinine. ?Ultrasound renal negative for any obstructive uropathy. ?FeNa 0.1 suggestive of renal etiology. ?Continue with IV hydration and monitor. ?  ?  Hyperlipidemia ?  CAD (coronary artery disease) ?Currently holding home regimen.  Resume once stable. ?  ?  Hyperbilirubinemia ?Monitor for now. ?  ?  Colostomy in place [facility]) ?No evidence of blood in the stool. ?Monitor. ? ?COVID exposure. ?Family member who was with the patient on 4/20 tested positive for COVID. ?IP recommends to keep the patient in abdomen isolation for now.  Check COVID on 4/24 or if the patient has symptoms. ?Monitor ? ?Subjective: Difficulty swallowing solid food.  No nausea no vomiting.  No fever no chills.  No chest pain abdominal pain. ? ?Physical Exam: ?Vitals:  ? 12/04/21 1300 12/04/21 1310 12/04/21 1345 12/04/21 1716  ?BP: (!) 141/81 (!) 167/70 (!) 168/63 (!) 141/66  ?Pulse: 82 77 85 69  ?Resp: 15 12 14 19   ?Temp:   99.1 ?F (37.3 ?C) 98.5 ?F (36.9 ?C)  ?TempSrc:   Oral Oral  ?SpO2: 100% 99% 98% 98%  ?Weight:      ?Height:      ? ?General: Appear in mild distress; no visible Abnormal Neck Mass Or lumps, Conjunctiva normal ?Cardiovascular: S1 and S2 Present, no Murmur, ?Respiratory: good respiratory effort, Bilateral Air entry present and CTA, no Crackles, no wheezes ?Abdomen: Bowel Sound present, Non tender ?Extremities: no Pedal edema ?Neurology: alert and oriented to time, place, and person ?Gait not checked due to patient safety concerns  ? ?Data Reviewed: ?I have Reviewed nursing notes, Vitals, and Lab results since pt's last encounter.  Pertinent lab results CBC and BMP ?I have ordered test including CBC and BMP ?I have discussed pt's care plan and test results with GI.  ? ?Family Communication: None at bedside ? ?Disposition: ?Status is: Inpatient ?Remains inpatient appropriate because: Difficulty swallowing.  Currently on  full liquid diet.  Hopefully will be able to advance tomorrow. ? ?Author: ?Lynden Oxford, MD ?12/04/2021 7:44 PM ? ?Please look on [email or web address] to find out who is on call. ?

## 2021-12-04 NOTE — Transfer of Care (Signed)
Immediate Anesthesia Transfer of Care Note ? ?Patient: Andres Richardson ? ?Procedure(s) Performed: ESOPHAGOGASTRODUODENOSCOPY (EGD) WITH PROPOFOL ?BIOPSY ? ?Patient Location: PACU and Endoscopy Unit ? ?Anesthesia Type:MAC ? ?Level of Consciousness: awake, alert  and oriented ? ?Airway & Oxygen Therapy: Patient Spontanous Breathing and Patient connected to face mask ? ?Post-op Assessment: Report given to RN and Post -op Vital signs reviewed and stable ? ?Post vital signs: Reviewed and stable ? ?Last Vitals:  ?Vitals Value Taken Time  ?BP    ?Temp    ?Pulse 87 12/04/21 1233  ?Resp 19 12/04/21 1233  ?SpO2 98 % 12/04/21 1233  ?Vitals shown include unvalidated device data. ? ?Last Pain:  ?Vitals:  ? 12/04/21 1115  ?TempSrc: Tympanic  ?PainSc: 0-No pain  ?   ? ?  ? ?Complications: No notable events documented. ?

## 2021-12-04 NOTE — Anesthesia Postprocedure Evaluation (Signed)
Anesthesia Post Note ? ?Patient: Andres Richardson ? ?Procedure(s) Performed: ESOPHAGOGASTRODUODENOSCOPY (EGD) WITH PROPOFOL ?BIOPSY ? ?  ? ?Patient location during evaluation: PACU ?Anesthesia Type: MAC ?Level of consciousness: awake and alert ?Pain management: pain level controlled ?Vital Signs Assessment: post-procedure vital signs reviewed and stable ?Respiratory status: spontaneous breathing, nonlabored ventilation, respiratory function stable and patient connected to nasal cannula oxygen ?Cardiovascular status: stable and blood pressure returned to baseline ?Postop Assessment: no apparent nausea or vomiting ?Anesthetic complications: no ? ? ?No notable events documented. ? ?Last Vitals:  ?Vitals:  ? 12/04/21 1310 12/04/21 1345  ?BP: (!) 167/70 (!) 168/63  ?Pulse: 77 85  ?Resp: 12 14  ?Temp:  37.3 ?C  ?SpO2: 99% 98%  ?  ?Last Pain:  ?Vitals:  ? 12/04/21 1354  ?TempSrc:   ?PainSc: 0-No pain  ? ? ?  ?  ?  ?  ?  ?  ? ?Suzette Battiest E ? ? ? ? ?

## 2021-12-04 NOTE — Progress Notes (Signed)
Dr. Marca Ancona GI given update Pt reports that food will not go down."It feels stuck and my chest burns" Dr. Allena Katz rounding also given update. New order given to change diet ?

## 2021-12-04 NOTE — Hospital Course (Signed)
Andres Richardson is a 56 y.o. 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   ?DKA has resolved.  Now on basal bolus regimen. ?EGD shows esophageal and duodenal ulcer without any active bleeding. ?

## 2021-12-04 NOTE — Op Note (Signed)
[facility] ?Patient Name: Andres Richardson ?Procedure Date: 12/04/2021 ?MRN: [id number] ?Attending MD: Ronnette Juniper , MD ?Date of Birth: [birth year] ?CSN: [id number] ?Age: 56 ?Admit Type: Inpatient ?Procedure:                Upper GI endoscopy ?Indications:              Hematemesis ?Providers:                Ronnette Juniper, MD, Carmie End, RN, Frazier Richards,  ?                          Technician ?Referring MD:             [facility] ?Medicines:                Monitored Anesthesia Care ?Complications:            No immediate complications. Estimated blood loss:  ?                          Minimal. ?Estimated Blood Loss:     Estimated blood loss was minimal. ?Procedure:                Pre-Anesthesia Assessment: ?                          - Prior to the procedure, a History and Physical  ?                          was performed, and patient medications and  ?                          allergies were reviewed. The patient's tolerance of  ?                          previous anesthesia was also reviewed. The risks  ?                          and benefits of the procedure and the sedation  ?                          options and risks were discussed with the patient.  ?                          All questions were answered, and informed consent  ?                          was obtained. Prior Anticoagulants: The patient has  ?                          taken no previous anticoagulant or antiplatelet  ?                          agents. ASA Grade Assessment: III - A patient with  ?                          severe systemic disease.  After reviewing the risks  ?                          and benefits, the patient was deemed in  ?                          satisfactory condition to undergo the procedure. ?                          After obtaining informed consent, the endoscope was  ?                          passed under direct vision. Throughout the  ?                          procedure, the patient's blood  pressure, pulse, and  ?                          oxygen saturations were monitored continuously. The  ?                          [id number]) Olympus endoscope was introduced  ?                          through the mouth, and advanced to the second part  ?                          of duodenum. The upper GI endoscopy was  ?                          accomplished without difficulty. The patient  ?                          tolerated the procedure well. ?Scope In: ?Scope Out: ?Findings: ?     LA Grade D (one or more mucosal breaks involving at least 75% of  ?     esophageal circumference) esophagitis with no bleeding was found 20 to  ?     40 cm from the incisors. Biopsies were taken with a cold forceps for  ?     histology. ?     Many superficial esophageal ulcers with no bleeding and no stigmata of  ?     recent bleeding were found 20 to 40 cm from the incisors. There was  ?     pigmentation noted in the entire esophagus. ?     Localized mildly erythematous mucosa without bleeding was found in the  ?     gastric antrum. Biopsies were taken with a cold forceps for Helicobacter  ?     pylori testing. ?     The cardia and gastric fundus were normal on retroflexion. ?     Many non-bleeding superficial duodenal ulcers with a clean ulcer base  ?     (Forrest Class III) were found in the duodenal bulb and in the first  ?     portion of the duodenum. The largest lesion was 6 mm in largest  ?     dimension. ?  Diffuse severely erythematous mucosa without active bleeding and with no  ?     stigmata of bleeding was found in the duodenal bulb and in the first  ?     portion of the duodenum. ?Impression:               - LA Grade D acute and erosive esophagitis with no  ?                          bleeding. Biopsied. ?                          - Esophageal ulcers with no bleeding and no  ?                          stigmata of recent bleeding. ?                          - Erythematous mucosa in the antrum. Biopsied. ?                           - Non-bleeding duodenal ulcers with a clean ulcer  ?                          base (Forrest Class III). ?                          - Erythematous duodenopathy. ?Moderate Sedation: ?     Patient did not receive moderate sedation for this procedure, but  ?     instead received monitored anesthesia care. ?Recommendation:           - Resume regular diet. ?                          - Continue present medications. ?                          - Await pathology results. ?                          - PPI BID for 2 months and sucralfate 1 gm four  ?                          times a day for 2 weeks. ?Procedure Code(s):        --- Professional --- ?                          [phone], Esophagogastroduodenoscopy, flexible,  ?                          transoral; with biopsy, single or multiple ?Diagnosis Code(s):        --- Professional --- ?                          K20.80, Other esophagitis without bleeding ?  K22.10, Ulcer of esophagus without bleeding ?                          K31.89, Other diseases of stomach and duodenum ?                          K26.9, Duodenal ulcer, unspecified as acute or  ?                          chronic, without hemorrhage or perforation ?                          K92.0, Hematemesis ?CPT copyright 2019 [employer]. All rights reserved. ?The codes documented in this report are preliminary and upon coder review may  ?be revised to meet current compliance requirements. ?Ronnette Juniper, MD ?12/04/2021 12:39:32 PM ?This report has been signed electronically. ?Number of Addenda: 0 ?

## 2021-12-05 LAB — GLUCOSE, CAPILLARY
Glucose-Capillary: 143 mg/dL — ABNORMAL HIGH (ref 70–99)
Glucose-Capillary: 155 mg/dL — ABNORMAL HIGH (ref 70–99)
Glucose-Capillary: 182 mg/dL — ABNORMAL HIGH (ref 70–99)
Glucose-Capillary: 223 mg/dL — ABNORMAL HIGH (ref 70–99)
Glucose-Capillary: 251 mg/dL — ABNORMAL HIGH (ref 70–99)
Glucose-Capillary: 274 mg/dL — ABNORMAL HIGH (ref 70–99)
Glucose-Capillary: 316 mg/dL — ABNORMAL HIGH (ref 70–99)

## 2021-12-05 LAB — BASIC METABOLIC PANEL
Anion gap: 8 (ref 5–15)
BUN: 20 mg/dL (ref 6–20)
CO2: 29 mmol/L (ref 22–32)
Calcium: 8.2 mg/dL — ABNORMAL LOW (ref 8.9–10.3)
Chloride: 104 mmol/L (ref 98–111)
Creatinine, Ser: 1.06 mg/dL (ref 0.61–1.24)
GFR, Estimated: >60 mL/min (ref >60)
Glucose, Bld: 164 mg/dL — ABNORMAL HIGH (ref 70–99)
Potassium: 3.2 mmol/L — ABNORMAL LOW (ref 3.5–5.1)
Sodium: 141 mmol/L (ref 135–145)

## 2021-12-05 LAB — CBC
HCT: 39.9 % (ref 39.0–52.0)
Hemoglobin: 13.5 g/dL (ref 13.0–17.0)
MCH: 30.2 pg (ref 26.0–34.0)
MCHC: 33.8 g/dL (ref 30.0–36.0)
MCV: 89.3 fL (ref 80.0–100.0)
Platelets: 173 10*3/uL (ref 150–400)
RBC: 4.47 MIL/uL (ref 4.22–5.81)
RDW: 14 % (ref 11.5–15.5)
WBC: 6 10*3/uL (ref 4.0–10.5)
nRBC: 0 % (ref 0.0–0.2)

## 2021-12-05 MED ORDER — SODIUM CHLORIDE 0.9 % IV SOLN: INTRAVENOUS | Status: DC

## 2021-12-05 MED ORDER — INSULIN GLARGINE-YFGN 100 UNIT/ML ~~LOC~~ SOLN
30.0000 [IU] | Freq: Every day | SUBCUTANEOUS | Status: DC
Start: 2021-12-06 — End: 2021-12-06
  Administered 2021-12-06: 30 [IU] via SUBCUTANEOUS
  Filled 2021-12-05: qty 0.3

## 2021-12-05 MED ORDER — POTASSIUM CHLORIDE CRYS ER 20 MEQ PO TBCR
40.0000 meq | EXTENDED_RELEASE_TABLET | Freq: Once | ORAL | Status: AC
  Administered 2021-12-05: 40 meq via ORAL
  Filled 2021-12-05: qty 2

## 2021-12-05 MED ORDER — PHENOL 1.4 % MT LIQD
1.0000 | OROMUCOSAL | Status: DC | PRN
  Administered 2021-12-05: 1 via OROMUCOSAL
  Filled 2021-12-05: qty 177

## 2021-12-05 MED ORDER — INSULIN GLARGINE-YFGN 100 UNIT/ML ~~LOC~~ SOLN
5.0000 [IU] | Freq: Once | SUBCUTANEOUS | Status: AC
  Administered 2021-12-05: 5 [IU] via SUBCUTANEOUS
  Filled 2021-12-05: qty 0.05

## 2021-12-05 NOTE — Progress Notes (Signed)
?Progress Note ?Patient: Andres Richardson [id number] DOB: 16-Jan-1966 DOA: 12/02/2021  ?DOS: the patient was seen and examined on 12/05/2021 ? ?Brief hospital course: ?Andres Richardson is a 56 y.o. 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   ?DKA has resolved.  Now on basal bolus regimen. ?EGD shows esophageal and duodenal ulcer without any active bleeding. ?Assessment and Plan: ? DKA (diabetic ketoacidosis) (HCC) ?  Uncontrolled type 2 diabetes mellitus with hyperglycemia, with long-term current use of insulin (HCC) ?Presents with complaints of nausea and vomiting.  Work-up showed elevated anion gap, glucose of more than thousand, acute kidney injury, mild hyperkalemia as well as elevated.  Uric acid consistent with DKA. ?Patient was treated with IV fluids and IV insulin drip. ?Currently anion gap is closed the patient does not have any further nausea or vomiting. ?Patient was given low-dose of insulin.  Currently we will provide 30 units of Lantus along with the bolus regimen. ?Transition to home regimen on discharge which he says is 15 units in the morning and 25 units in the evening. ?  ?  Hematemesis ?Most likely Mallory-Weiss tear in the setting of 15-20 episodes of vomiting. ?Hemoglobin currently stable. ?GI consulted. ?EGD shows esophageal and duodenal ulcer without any active bleeding. ?Continue PPI twice daily for 2 months and Carafate. ?On full liquid diet due to difficulty swallowing.  Tolerating the 3 diet right now. ?  ?AKI (acute kidney injury) (HCC) ?   Hyperkalemia ?  Hypernatremia ?In the setting of DKA and dehydration. ?Renal function still appears to be not back to baseline. ?Baseline is normal less than 1 serum creatinine. ?Ultrasound renal negative for any obstructive uropathy. ?FeNa 0.1 suggestive of renal etiology. ?Continue with IV hydration and monitor. ?  ?  Hyperlipidemia ?  CAD (coronary artery disease) ?Currently holding home regimen.  Resume once stable. ?  ?  Hyperbilirubinemia ?Monitor for now. ?  ?  Colostomy in place [facility]) ?No evidence of blood in the stool. ?Monitor. ?  ?COVID exposure. ?Family member who was with the patient on 4/20 tested positive for COVID. ?IP recommends to keep the patient in abdomen isolation for now.  Check COVID on 4/24 or if the patient has symptoms. ?Monitor ? ?Subjective: No nausea no vomiting.  Reported some dizziness and fatigue and tiredness. ? ?Physical Exam: ?Vitals:  ? 12/04/21 1716 12/04/21 2203 12/05/21 0214 12/05/21 1213  ?BP: (!) 141/66 (!) 132/58 107/76 110/63  ?Pulse: 69 79 82 85  ?Resp: 19 18 18 17   ?Temp: 98.5 ?F (36.9 ?C) 98.3 ?F (36.8 ?C) 99.2 ?F (37.3 ?C) 98.8 ?F (37.1 ?C)  ?TempSrc: Oral Oral Oral Oral  ?SpO2: 98% 100% 98% 99%  ?Weight:      ?Height:      ? ?General: Appear in mild distress; no visible Abnormal Neck Mass Or lumps, Conjunctiva normal ?Cardiovascular: S1 and S2 Present, no Murmur, ?Respiratory: good respiratory effort, Bilateral Air entry present and CTA, no Crackles, no wheezes ?Abdomen: Bowel Sound present, Non tender ?Extremities: no Pedal edema ?Neurology: alert and oriented to time, place, and person ?Gait not checked due to patient safety concerns  ? ?Data Reviewed: ?I have Reviewed nursing  notes, Vitals, and Lab results since pt's last encounter. Pertinent lab results BMP ?I have ordered test including BMP   ? ?Family Communication: None at bedside ? ?Disposition: ?Status is: Inpatient ?Remains inpatient appropriate because: Close observation for monitoring of hypoglycemia in a  patient who presented with DKA. ?Author: ?Lynden Oxford, MD ?12/05/2021 7:45 PM ? ?Please look on [email or web address] to find out who is on call. ?

## 2021-12-05 NOTE — Progress Notes (Signed)
Assisted patient to stand and transfer from bed to chair. Patient required minimal assistance from RN to stand, and c/o of feeling "a little wobbly" and "dizzy" once standing.  MD notified. ? ?Bradd Burner, RN  ?

## 2021-12-05 NOTE — Progress Notes (Signed)
Subjective: ?Patient states he had difficulty eating [place] last night and his diet has been changed to dysphagia level 3 mechanical soft diet which she is able to tolerate without issues. ? ?Objective: ?Vital signs in last 24 hours: ?Temp:  [98.3 ?F (36.8 ?C)-99.2 ?F (37.3 ?C)] 99.2 ?F (37.3 ?C) (04/22 0214) ?Pulse Rate:  [69-90] 82 (04/22 0214) ?Resp:  [10-20] 18 (04/22 0214) ?BP: (105-179)/(58-83) 107/76 (04/22 0214) ?SpO2:  [97 %-100 %] 98 % (04/22 0214) ?Weight:  [91.1 kg] 91.1 kg (04/21 1115) ?Weight change:  ?Last BM Date :  (colostomy) ? ?PE: Appears comfortable, no pallor, no icterus ?GENERAL: On room air, not using accessory muscles of respiration  ?ABDOMEN: Colostomy bag has formed dark brown stool ?EXTREMITIES: No deformity ? ?Lab Results: ?Results for orders placed or performed during the hospital encounter of 12/02/21 (from the past 48 hour(s))  ?Sodium, urine, random     Status: None  ? Collection Time: 12/03/21 10:05 AM  ?Result Value Ref Range  ? Sodium, Ur <10 mmol/L  ?  Comment: Performed at [facility], [address], [place], [place] [address]  ?Creatinine, urine, random     Status: None  ? Collection Time: 12/03/21 10:05 AM  ?Result Value Ref Range  ? Creatinine, Urine 92.91 mg/dL  ?  Comment: Performed at [facility], [address] [place], [place] [address]  ?Osmolality, urine     Status: None  ? Collection Time: 12/03/21 10:05 AM  ?Result Value Ref Range  ? Osmolality, Ur 625 300 - 900 mOsm/kg  ?  Comment: Performed at [facility], [address], [place], [place] [address]  ?Urea nitrogen, urine     Status: None  ? Collection Time: 12/03/21 10:05 AM  ?Result Value Ref Range  ? Urea Nitrogen, Ur 1,130 Not Estab. mg/dL  ?  Comment: (NOTE) ?Performed At: [facility] ?[address] [place], [place] [address] ?Jolene Schimke MD CB:[id number] ?  ?Glucose, capillary     Status: Abnormal  ? Collection Time: 12/03/21 10:11 AM  ?Result  Value Ref Range  ? Glucose-Capillary 222 (H) 70 - 99 mg/dL  ?  Comment: Glucose reference range applies only to samples taken after fasting for at least 8 hours.  ?Glucose, capillary     Status: Abnormal  ? Collection Time: 12/03/21 11:02 AM  ?Result Value Ref Range  ? Glucose-Capillary 203 (H) 70 - 99 mg/dL  ?  Comment: Glucose reference range applies only to samples taken after fasting for at least 8 hours.  ?Glucose, capillary     Status: Abnormal  ? Collection Time: 12/03/21  4:09 PM  ?Result Value Ref Range  ? Glucose-Capillary 295 (H) 70 - 99 mg/dL  ?  Comment: Glucose reference range applies only to samples taken after fasting for at least 8 hours.  ? Comment 1 Notify RN   ? Comment 2 Document in Chart   ?Glucose, capillary     Status: Abnormal  ? Collection Time: 12/03/21  7:46 PM  ?Result Value Ref Range  ? Glucose-Capillary 249 (H) 70 - 99 mg/dL  ?  Comment: Glucose reference range applies only to samples taken after fasting for at least 8 hours.  ?Glucose, capillary     Status: Abnormal  ? Collection Time: 12/03/21 11:55 PM  ?Result Value Ref Range  ? Glucose-Capillary 267 (H) 70 - 99 mg/dL  ?  Comment: Glucose reference range applies only to samples taken after fasting for at least 8 hours.  ?  CBC     Status: Abnormal  ? Collection Time: 12/04/21  2:42 AM  ?Result Value Ref Range  ? WBC 7.7 4.0 - 10.5 K/uL  ? RBC 3.87 (L) 4.22 - 5.81 MIL/uL  ? Hemoglobin 11.5 (L) 13.0 - 17.0 g/dL  ? HCT 35.4 (L) 39.0 - 52.0 %  ? MCV 91.5 80.0 - 100.0 fL  ? MCH 29.7 26.0 - 34.0 pg  ? MCHC 32.5 30.0 - 36.0 g/dL  ? RDW 14.6 11.5 - 15.5 %  ? Platelets 163 150 - 400 K/uL  ? nRBC 0.0 0.0 - 0.2 %  ?  Comment: Performed at [facility], [address] [place], [place] [address]  ?Basic metabolic panel     Status: Abnormal  ? Collection Time: 12/04/21  2:42 AM  ?Result Value Ref Range  ? Sodium 140 135 - 145 mmol/L  ? Potassium 3.4 (L) 3.5 - 5.1 mmol/L  ? Chloride 109 98 - 111 mmol/L  ? CO2 26 22 - 32 mmol/L  ?  Glucose, Bld 231 (H) 70 - 99 mg/dL  ?  Comment: Glucose reference range applies only to samples taken after fasting for at least 8 hours.  ? BUN 39 (H) 6 - 20 mg/dL  ? Creatinine, Ser 1.13 0.61 - 1.24 mg/dL  ? Calcium 7.8 (L) 8.9 - 10.3 mg/dL  ? GFR, Estimated >60 >60 mL/min  ?  Comment: (NOTE) ?Calculated using the CKD-EPI Creatinine Equation (2021) ?  ? Anion gap 5 5 - 15  ?  Comment: Performed at [facility], [address] [place], [place] [address]  ?Glucose, capillary     Status: Abnormal  ? Collection Time: 12/04/21  3:35 AM  ?Result Value Ref Range  ? Glucose-Capillary 239 (H) 70 - 99 mg/dL  ?  Comment: Glucose reference range applies only to samples taken after fasting for at least 8 hours.  ?Glucose, capillary     Status: Abnormal  ? Collection Time: 12/04/21  7:44 AM  ?Result Value Ref Range  ? Glucose-Capillary 201 (H) 70 - 99 mg/dL  ?  Comment: Glucose reference range applies only to samples taken after fasting for at least 8 hours.  ? Comment 1 Notify RN   ? Comment 2 Document in Chart   ?Glucose, capillary     Status: Abnormal  ? Collection Time: 12/04/21 11:33 AM  ?Result Value Ref Range  ? Glucose-Capillary 183 (H) 70 - 99 mg/dL  ?  Comment: Glucose reference range applies only to samples taken after fasting for at least 8 hours.  ?Glucose, capillary     Status: Abnormal  ? Collection Time: 12/04/21  3:53 PM  ?Result Value Ref Range  ? Glucose-Capillary 238 (H) 70 - 99 mg/dL  ?  Comment: Glucose reference range applies only to samples taken after fasting for at least 8 hours.  ?Glucose, capillary     Status: Abnormal  ? Collection Time: 12/04/21  8:18 PM  ?Result Value Ref Range  ? Glucose-Capillary 268 (H) 70 - 99 mg/dL  ?  Comment: Glucose reference range applies only to samples taken after fasting for at least 8 hours.  ?Glucose, capillary     Status: Abnormal  ? Collection Time: 12/05/21 12:11 AM  ?Result Value Ref Range  ? Glucose-Capillary 223 (H) 70 - 99 mg/dL  ?   Comment: Glucose reference range applies only to samples taken after fasting for at least 8 hours.  ?Glucose, capillary     Status:  Abnormal  ? Collection Time: 12/05/21  4:39 AM  ?Result Value Ref Range  ? Glucose-Capillary 182 (H) 70 - 99 mg/dL  ?  Comment: Glucose reference range applies only to samples taken after fasting for at least 8 hours.  ?Basic metabolic panel     Status: Abnormal  ? Collection Time: 12/05/21  5:08 AM  ?Result Value Ref Range  ? Sodium 141 135 - 145 mmol/L  ? Potassium 3.2 (L) 3.5 - 5.1 mmol/L  ? Chloride 104 98 - 111 mmol/L  ? CO2 29 22 - 32 mmol/L  ? Glucose, Bld 164 (H) 70 - 99 mg/dL  ?  Comment: Glucose reference range applies only to samples taken after fasting for at least 8 hours.  ? BUN 20 6 - 20 mg/dL  ? Creatinine, Ser 1.06 0.61 - 1.24 mg/dL  ? Calcium 8.2 (L) 8.9 - 10.3 mg/dL  ? GFR, Estimated >60 >60 mL/min  ?  Comment: (NOTE) ?Calculated using the CKD-EPI Creatinine Equation (2021) ?  ? Anion gap 8 5 - 15  ?  Comment: Performed at [facility], [address], [place], [place] [address]  ?CBC     Status: None  ? Collection Time: 12/05/21  5:08 AM  ?Result Value Ref Range  ? WBC 6.0 4.0 - 10.5 K/uL  ? RBC 4.47 4.22 - 5.81 MIL/uL  ? Hemoglobin 13.5 13.0 - 17.0 g/dL  ? HCT 39.9 39.0 - 52.0 %  ? MCV 89.3 80.0 - 100.0 fL  ? MCH 30.2 26.0 - 34.0 pg  ? MCHC 33.8 30.0 - 36.0 g/dL  ? RDW 14.0 11.5 - 15.5 %  ? Platelets 173 150 - 400 K/uL  ? nRBC 0.0 0.0 - 0.2 %  ?  Comment: Performed at [facility], [address], [place], [place] [address]  ?Glucose, capillary     Status: Abnormal  ? Collection Time: 12/05/21  7:47 AM  ?Result Value Ref Range  ? Glucose-Capillary 143 (H) 70 - 99 mg/dL  ?  Comment: Glucose reference range applies only to samples taken after fasting for at least 8 hours.  ? ? ?Studies/Results: ?No results found. ? ?Medications: I have reviewed the patient's current medications. ? ?Assessment: ?Hematemesis-severe esophagitis and  esophageal ulcers noted in entire esophagus, gastritis and duodenal ulcers with duodenitis, biopsies taken, results pending ? ?DKA, resolved ?Renal impairment-resolved ?Normal BUN 20, normal hemoglobin 13.5 ? ?

## 2021-12-05 NOTE — Progress Notes (Signed)
Patient's diet advanced to dysphagia 3.  Tolerated lunch of scrambled eggs, sausage, muffin. ? ?Bradd Burner, RN  ?

## 2021-12-06 LAB — BASIC METABOLIC PANEL
Anion gap: 8 (ref 5–15)
BUN: 18 mg/dL (ref 6–20)
CO2: 30 mmol/L (ref 22–32)
Calcium: 8.2 mg/dL — ABNORMAL LOW (ref 8.9–10.3)
Chloride: 102 mmol/L (ref 98–111)
Creatinine, Ser: 1.07 mg/dL (ref 0.61–1.24)
GFR, Estimated: >60 mL/min (ref >60)
Glucose, Bld: 159 mg/dL — ABNORMAL HIGH (ref 70–99)
Potassium: 3 mmol/L — ABNORMAL LOW (ref 3.5–5.1)
Sodium: 140 mmol/L (ref 135–145)

## 2021-12-06 LAB — GLUCOSE, CAPILLARY: Glucose-Capillary: 167 mg/dL — ABNORMAL HIGH (ref 70–99)

## 2021-12-06 MED ORDER — POTASSIUM CHLORIDE CRYS ER 20 MEQ PO TBCR: 20.0000 meq | EXTENDED_RELEASE_TABLET | Freq: Every day | ORAL | Status: DC

## 2021-12-06 MED ORDER — SUCRALFATE 1 GM/10ML PO SUSP
1.0000 g | Freq: Two times a day (BID) | ORAL | 0 refills | Status: DC
Start: 2021-12-06 — End: 2022-07-29

## 2021-12-06 MED ORDER — PANTOPRAZOLE SODIUM 40 MG PO TBEC: DELAYED_RELEASE_TABLET | ORAL | 1 refills | Status: DC

## 2021-12-06 MED ORDER — POTASSIUM CHLORIDE CRYS ER 20 MEQ PO TBCR
40.0000 meq | EXTENDED_RELEASE_TABLET | ORAL | Status: AC
  Administered 2021-12-06 (×2): 40 meq via ORAL
  Filled 2021-12-06 (×2): qty 2

## 2021-12-06 MED ORDER — NOVOLIN 70/30 (70-30) 100 UNIT/ML ~~LOC~~ SUSP: SUBCUTANEOUS | 1 refills | Status: DC

## 2021-12-06 MED ORDER — POTASSIUM CHLORIDE CRYS ER 20 MEQ PO TBCR
20.0000 meq | EXTENDED_RELEASE_TABLET | Freq: Every day | ORAL | 0 refills | Status: DC
Start: 2021-12-07 — End: 2021-12-24

## 2021-12-06 NOTE — Evaluation (Signed)
Physical Therapy Evaluation & discharge ?Patient Details ?Name: Andres Richardson ?MRN: [id number] ?DOB: 1965/10/26 ?Today's Date: 12/06/2021 ? ?History of Present Illness ? Andres Richardson is a 56 y.o. 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  Found to be in DKA which has since resolved. ?  ?Clinical Impression ? Pt admitted with above diagnosis.  Recommend RW and was taught how to adjust to his height for home use.  With the RW he was steady and no LOB.  In static standing without it, he had posterior tendency and increased fall risk. Pt scheduled for d/c home today and will d/c from PT.  If d/c canceled, please re-order PT. ?   ?   ? ?Recommendations for follow up therapy are one component of a multi-disciplinary discharge planning process, led by the attending physician.  Recommendations may be updated based on patient status, additional functional criteria and insurance authorization. ? ?Follow Up Recommendations No PT follow up ? ?  ?Assistance Recommended at Discharge PRN  ?Patient can return home with the following ?   ? ?  ?Equipment Recommendations Rolling walker (2 wheels)  ?Recommendations for Other Services ?    ?  ?Functional Status Assessment Patient has had a recent decline in their functional status and demonstrates the ability to make significant improvements in function in a reasonable and predictable amount of time.  ? ?  ?Precautions / Restrictions Precautions ?Precautions: Fall ?Restrictions ?Weight Bearing Restrictions: No  ? ?  ? ?Mobility ? Bed  Mobility ?Overal bed mobility: Independent ?  ?  ?  ?  ?  ?  ?  ?  ? ?Transfers ?Overall transfer level: Needs assistance ?Equipment used: Rolling walker (2 wheels) ?Transfers: Sit to/from Stand ?Sit to Stand: Supervision, From elevated surface ?  ?  ?  ?  ?  ?General transfer comment: surface elevated to mimic his chair at home. ?  ? ?Ambulation/Gait ?Ambulation/Gait assistance: Min guard ?Gait Distance (Feet): 90 Feet ?Assistive device: Rolling walker (2 wheels) ?Gait Pattern/deviations: Step-through pattern, Trunk flexed ?  ?  ?  ?General Gait Details: Had to stand at [facility] for several minutes doing knee bends and ankle rotations to warm up his legs.  Stood without RW and posterior lean and he requested RW. Cues for posture during gait.  No LOB and steady with RW. ? ?Stairs ?  ?  ?  ?  ?  ? ?Wheelchair Mobility ?  ? ?Modified Rankin (Stroke Patients Only) ?  ? ?  ? ?Balance Overall balance assessment: Needs assistance ?  ?Sitting balance-Leahy Scale: Good ?  ?  ?  ?Standing balance-Leahy Scale: Fair ?Standing balance comment: posterior tendency ?  ?  ?  ?  ?  ?  ?  ?  ?  ?  ?  ?   ? ? ? ?Pertinent Vitals/Pain Pain Assessment ?Pain Assessment: 0-10 ?Pain Score: 2  ?Pain Location: sometimes throat when eating  ? ? ?Home Living Family/patient expects to be discharged to:: Private residence ?Living Arrangements: Non-relatives/Friends ?Available Help at Discharge: Available PRN/intermittently (but roommate will be home the next 2 weeks) ?Type of Home: House ?  ?  ?  ?  ?  Home Layout: Two level;Able to live on main level with bedroom/bathroom ?Home Equipment: Gilmer Mor - single point ?Additional Comments: Sometimes furniture walks.  Does laps 8-10x a day.Uses cane outside. Uses buggy for grocery shopping.  ?  ?Prior Function Prior Level of Function : Independent/Modified Independent ?  ?  ?  ?  ?  ?  ?Mobility Comments: Limiting driving due to new dx of sleep apnea ?  ?  ? ? ?Hand Dominance  ?   ? ?  ?Extremity/Trunk Assessment   ?   ?  ? ?Lower Extremity Assessment ?Lower Extremity Assessment: RLE deficits/detail;LLE deficits/detail ?RLE Deficits / Details: numbness ?LLE Deficits / Details: decreased DF, numbness ?  ? ?   ?Communication  ? Communication: No difficulties  ?Cognition Arousal/Alertness: Awake/alert ?Behavior During Therapy: [facility] for tasks assessed/performed ?Overall Cognitive Status: Within Functional Limits for tasks assessed ?  ?  ?  ?  ?  ?  ?  ?  ?  ?  ?  ?  ?  ?  ?  ?  ?  ?  ?  ? ?  ?General Comments   ? ?  ?Exercises    ? ?Assessment/Plan  ?  ?PT Assessment Patient does not need any further PT services  ?PT Problem List   ? ?   ?  ?PT Treatment Interventions     ? ?PT Goals (Current goals can be found in the Care Plan section)  ?Acute Rehab PT Goals ?Patient Stated Goal: Go home ?PT Goal Formulation: All assessment and education complete, DC therapy ? ?  ?Frequency   ?  ? ? ?Co-evaluation   ?  ?  ?  ?  ? ? ?  ?AM-PAC PT "6 Clicks" Mobility  ?Outcome Measure Help needed turning from your back to your side while in a flat bed without using bedrails?: None ?Help needed moving from lying on your back to sitting on the side of a flat bed without using bedrails?: None ?Help needed moving to and from a bed to a chair (including a wheelchair)?: None ?Help needed standing up from a chair using your arms (e.g., wheelchair or bedside chair)?: None ?Help needed to walk in hospital room?: A Little ?Help needed climbing 3-5 steps with a railing? : A Little ?6 Click Score: 22 ? ?  ?End of Session Equipment Utilized During Treatment: Gait belt ?Activity Tolerance: Patient tolerated treatment well ?Patient left: in chair;with call bell/phone within reach;with chair alarm set ?Nurse Communication: Mobility status ?PT Visit Diagnosis: Unsteadiness on feet (R26.81);Difficulty in walking, not elsewhere classified (R26.2) ?  ? ?Time: 2951-8841 ?PT Time Calculation (min) (ACUTE ONLY): 31 min ? ? ?Charges:   PT Evaluation ?$PT Eval Moderate  Complexity: 1 Mod ?PT Treatments ?$Gait Training: 8-22 mins ?  ?   ? ? ?Clydie Braun L. Katrinka Blazing, PT ? ?12/06/2021 ? ? ?Enzo Montgomery ?12/06/2021, 9:59 AM ? ?

## 2021-12-06 NOTE — Discharge Summary (Signed)
?Physician Discharge Summary ?  ?Patient: Andres Richardson MRN: [id number] DOB: [birth year]  ?Admit date:     12/02/2021  ?Discharge date: 12/06/2021  ?Discharge Physician: Lynden Oxford  ?PCP: Mila Palmer, MD ? ?Recommendations at discharge: ?Follow-up with PCP in 1 week.  If continues to have symptoms of abdominal pain may need to consider other therapy instead of GLP-1 agonist. ? ?Discharge Diagnoses: ?Principal Problem: ?  DKA (diabetic ketoacidosis) (HCC) ?Active Problems: ?  Uncontrolled type 2 diabetes mellitus with hyperglycemia, with long-term current use of insulin (HCC) ?  AKI (acute kidney injury) (HCC) ?  Hematemesis ?  Hyperkalemia ?  Hypernatremia ?  Hyperlipidemia ?  CAD (coronary artery disease) ?  Hyperbilirubinemia ?  Colostomy in place [facility]) ? ? ?Hospital Course: ?Andres Richardson is a 56 y.o. 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   ?DKA has resolved.  Now on basal bolus regimen. ?EGD shows esophageal and duodenal ulcer without any active bleeding. ? ?Assessment and Plan: ?DKA (diabetic ketoacidosis) (HCC) ?Uncontrolled type 2 diabetes mellitus with hyperglycemia, with long-term current use of insulin (HCC) ?Presents with complaints of nausea and vomiting.  Work-up showed elevated anion gap, glucose of more than thousand, acute kidney injury, mild hyperkalemia as well as elevated, consistent with DKA. ?Patient was treated with IV fluids and IV insulin drip. ?Currently anion gap is closed the patient does not have any further nausea or  vomiting. ?Treated with Lantus and aspart basal bolus regimen. ?Transition to home regimen on discharge which he says is 15 units in the morning and 30 units of NPH 70/30 in the evening. ? ?Patient was recently started on Wegovy which might be responsible for his initial GI symptoms after which he stopped taking his insulin which is the reason why the patient is here with a DKA.  Patient was recommended to monitor for reoccurrence of the symptoms and discussed with the PCP regarding alternatives if continues to have abdominal symptoms. ?  ?Hematemesis ?Esophageal and duodenal ulcer. ?15-20 episodes of vomiting. Hemoglobin currently stable. ?GI consulted. ?EGD shows esophageal and duodenal ulcer without any active bleeding. ?Continue PPI twice daily for 2 months and Carafate. ?On full liquid diet due to difficulty swallowing.  Tolerating the 3 diet right now. ?  ?AKI (acute kidney injury) (HCC) ?Hyperkalemia ?Hypernatremia ?In the setting of DKA and dehydration. ?Renal function still appears to be not back to baseline. ?Baseline is normal less than 1 serum creatinine. ?Ultrasound renal negative for any obstructive uropathy. ?FeNa 0.1 suggestive of prerenal etiology. ?  ?Hyperlipidemia ?CAD (coronary artery disease) ?Resume home regimen. ?  ?Hyperbilirubinemia ?Monitor for now. ?  ?Colostomy in place [facility]) ?No evidence of blood in the stool. ?Monitor. ?  ?COVID exposure. ?Family member who was with the patient on 4/20 tested positive for COVID. ?IP recommends to keep the patient in abdomen isolation for now.  Check COVID on 4/24 or if the patient has symptoms. ?Monitor ?Consultants: GI ?Procedures performed:  ?EGD ?DISCHARGE MEDICATION: ?Allergies as of 12/06/2021   ? ?   Reactions  ? Adhesive [tape] Rash  ? ?  ? ?  ?Medication List  ?  ? ?STOP taking these  medications   ? ?glipiZIDE 5 MG 24 hr tablet ?Commonly known as: GLUCOTROL XL ?  ? ?  ? ?TAKE these medications   ? ?acetaminophen 500 MG tablet ?Commonly known as:  TYLENOL ?Take 500 mg by mouth every 6 (six) hours as needed for moderate pain. ?  ?aspirin 81 MG EC tablet ?Take 1 tablet (81 mg total) by mouth daily. Swallow whole. ?  ?atorvastatin 40 MG tablet ?Commonly known as: LIPITOR ?Take 1 tablet (40 mg total) by mouth daily. ?  ?empagliflozin 10 MG Tabs tablet ?Commonly known as: JARDIANCE ?Take 10 mg by mouth daily in the afternoon. ?  ?furosemide 40 MG tablet ?Commonly known as: LASIX ?Take 1 tablet (40 mg total) by mouth daily. ?  ?insulin NPH-regular Human (70-30) 100 UNIT/ML injection ?Inject 15-30 Units into the skin 2 (two) times daily with a meal. 15 units in AM and 30 units in PM ?What changed: Another medication with the same name was removed. Continue taking this medication, and follow the directions you see here. ?  ?Iron (Ferrous Sulfate) 325 (65 Fe) MG Tabs ?Take 1 tablet by mouth daily. ?  ?isosorbide mononitrate 30 MG 24 hr tablet ?Commonly known as: IMDUR ?Take 1 tablet (30 mg total) by mouth daily. ?  ?metFORMIN 750 MG 24 hr tablet ?Commonly known as: GLUCOPHAGE-XR ?Take 1 tablet (750 mg total) by mouth 2 (two) times daily. ?  ?metoprolol succinate 25 MG 24 hr tablet ?Commonly known as: TOPROL-XL ?Take 1 tablet (25 mg total) by mouth daily. ?  ?multivitamin with minerals Tabs tablet ?Take 1 tablet by mouth daily. ?  ?nitroGLYCERIN 0.4 MG SL tablet ?Commonly known as: NITROSTAT ?Place 0.4 mg under the tongue every 5 (five) minutes as needed for chest pain. ?  ?OVER THE COUNTER MEDICATION ?Take 1 tablet by mouth daily. Leg Cramps ?  ?pantoprazole 40 MG tablet ?Commonly known as: Protonix ?Take 1 tablet (40 mg total) by mouth 2 (two) times daily before a meal for 60 days, THEN 1 tablet (40 mg total) daily. ?Start taking on: December 06, 2021 ?  ?potassium chloride SA 20 MEQ tablet ?Commonly known as: KLOR-CON M ?Take 1 tablet (20 mEq total) by mouth daily. ?Start taking on: December 07, 2021 ?  ?sucralfate 1 GM/10ML suspension ?Commonly known as: CARAFATE ?Take  10 mLs (1 g total) by mouth 2 (two) times daily. ?  ?Wegovy 0.25 MG/0.5ML Soaj ?Generic drug: Semaglutide-Weight Management ?Inject 0.25 mg into the skin. ?  ? ?  ? ? Follow-up Information   ? ? Mila PalmerWolters, Sharon, MD. Schedule an appointment as soon as possible for a visit in 1 week(s).   ?Specialty: Family Medicine ?Contact information: ?[address] ?[address] ?[place] [address] ?[phone] ? ? ?  ?  ? ?  ?  ? ?  ? ?Disposition: Home ?Diet recommendation: Dysphagia type 3 thin liquid ? ?Discharge Exam: ?Vitals:  ? 12/05/21 0214 12/05/21 1213 12/05/21 2009 12/06/21 0440  ?BP: 107/76 110/63 127/68 105/81  ?Pulse: 82 85 85 87  ?Resp: 18 17 18 16   ?Temp: 99.2 ?F (37.3 ?C) 98.8 ?F (37.1 ?C) 98.6 ?F (37 ?C) (!) 97.5 ?F (36.4 ?C)  ?TempSrc: Oral Oral Oral Oral  ?SpO2: 98% 99% 98% 97%  ?Weight:      ?Height:      ? ?General: Appear in no distress; no visible Abnormal Neck Mass Or lumps, Conjunctiva normal ?Cardiovascular: S1 and S2 Present, no Murmur, ?Respiratory: good respiratory effort, Bilateral Air entry present and  CTA, no Crackles, no wheezes ?Abdomen: Bowel Sound present, Non tender ?Extremities: no Pedal edema ?Neurology: alert and oriented to time, place, and person ?Gait not checked due to patient safety concerns ?Filed Weights  ? 12/02/21 1513 12/02/21 1742 12/04/21 1115  ?Weight: 96.2 kg 91.1 kg 91.1 kg  ? ?Condition at discharge: stable ? ?The results of significant diagnostics from this hospitalization (including imaging, microbiology, ancillary and laboratory) are listed below for reference.  ? ?Imaging Studies: ?US RENAL ? ?Result Date: 12/03/2021 ?CLINICAL DATA:  Acute renal insufficiency. EXAM: RENAL / URINARY TRACT ULTRASOUND COMPLETE COMPARISON:  CT scan 09/07/2021 FINDINGS: Right Kidney: Renal measurements: 10.8 x 5.1 x 6.0 cm = volume: 172.95 mL. Normal renal cortical thickness and echogenicity. Simple midpole right renal cyst is stable. No renal calculi or hydronephrosis. Left Kidney:  Renal measurements: 12.7 x 7.0 x 6.0 cm = volume: 278.7 mL. Normal renal cortical thickness and echogenicity without focal lesions or hydronephrosis. Bladder: Appears normal for degree of bladder distention. Other: Non

## 2021-12-06 NOTE — Progress Notes (Signed)
Discharge instructions provided to and reviewed with patient.  Patient verbalized understanding.  Cardiac monitoring and PIVs removed.  Patient escorted to main entrance via wheelchair, with front wheel walker for home use and other patient belongings, for transport home with friend. ? ?Bradd Burner, RN  ?

## 2021-12-06 NOTE — Plan of Care (Signed)

## 2021-12-06 NOTE — TOC Transition Note (Signed)
Transition of Care (TOC) - CM/SW Discharge Note ? ? ?Patient Details  ?Name: Andres Richardson ?MRN: [id number] ?Date of Birth: 02-28-1966 ? ?Transition of Care (TOC) CM/SW Contact:  ?Darleene Cleaver, LCSW ?Phone Number: ?12/06/2021, 11:08 AM ? ? ?Clinical Narrative:    ? ?CSW was informed that patient is self pay and needs a walker.  CSW spoke to [employer], they will contact patient to make arrangements for him to get a walker.  CSW was informed by bedside nurse that patient's walker has been delivered.  CSW signing off, please reconsult if other social work needs arise. ? ?Final next level of care: Home/Self Care ?Barriers to Discharge: Barriers Resolved ? ? ?Patient Goals and CMS Choice ?Patient states their goals for this hospitalization and ongoing recovery are:: To return back home ?  ?  ? ?Discharge Placement ?  ?           ?  ?  ?  ?  ? ?Discharge Plan and Services ?  ?  ?           ?DME Arranged: Walker ?DME Agency: [employer] ?Date DME Agency Contacted: 12/06/21 ?Time DME Agency Contacted: 1108 ?Representative spoke with at DME Agency: Leavy Cella ?  ?  ?  ?  ?  ? ?Social Determinants of Health (SDOH) Interventions ?  ? ? ?Readmission Risk Interventions ?   ? View : No data to display.  ?  ?  ?  ? ? ? ? ? ?

## 2021-12-06 NOTE — Progress Notes (Signed)
Walker for home use delivered to patient's room. ? ?Bradd Burner, RN  ?

## 2021-12-07 ENCOUNTER — Encounter (HOSPITAL_COMMUNITY): Payer: Self-pay | Admitting: Gastroenterology

## 2021-12-07 LAB — SURGICAL PATHOLOGY

## 2021-12-08 ENCOUNTER — Ambulatory Visit: Payer: Medicaid Other | Admitting: Internal Medicine

## 2021-12-23 ENCOUNTER — Other Ambulatory Visit: Payer: Self-pay | Admitting: Gastroenterology

## 2021-12-23 DIAGNOSIS — R1310 Dysphagia, unspecified: Secondary | ICD-10-CM

## 2021-12-23 DIAGNOSIS — K2210 Ulcer of esophagus without bleeding: Secondary | ICD-10-CM

## 2021-12-24 ENCOUNTER — Encounter (HOSPITAL_COMMUNITY): Payer: Self-pay | Admitting: *Deleted

## 2021-12-24 ENCOUNTER — Emergency Department (HOSPITAL_COMMUNITY): Payer: Medicaid Other

## 2021-12-24 ENCOUNTER — Emergency Department (HOSPITAL_COMMUNITY)
Admission: EM | Admit: 2021-12-24 | Discharge: 2021-12-24 | Disposition: A | Discharge status: Home / Self Care | Payer: Medicaid Other | Attending: Emergency Medicine | Admitting: Emergency Medicine

## 2021-12-24 ENCOUNTER — Other Ambulatory Visit: Payer: Self-pay

## 2021-12-24 DIAGNOSIS — Z7982 Long term (current) use of aspirin: Secondary | ICD-10-CM | POA: Diagnosis not present

## 2021-12-24 DIAGNOSIS — E109 Type 1 diabetes mellitus without complications: Secondary | ICD-10-CM | POA: Diagnosis not present

## 2021-12-24 DIAGNOSIS — Z79899 Other long term (current) drug therapy: Secondary | ICD-10-CM | POA: Insufficient documentation

## 2021-12-24 DIAGNOSIS — L03317 Cellulitis of buttock: Secondary | ICD-10-CM

## 2021-12-24 DIAGNOSIS — Z933 Colostomy status: Secondary | ICD-10-CM | POA: Insufficient documentation

## 2021-12-24 DIAGNOSIS — R21 Rash and other nonspecific skin eruption: Secondary | ICD-10-CM | POA: Diagnosis present

## 2021-12-24 DIAGNOSIS — E876 Hypokalemia: Secondary | ICD-10-CM

## 2021-12-24 LAB — COMPREHENSIVE METABOLIC PANEL
ALT: 11 U/L (ref 0–44)
AST: 13 U/L — ABNORMAL LOW (ref 15–41)
Albumin: 3 g/dL — ABNORMAL LOW (ref 3.5–5.0)
Alkaline Phosphatase: 66 U/L (ref 38–126)
Anion gap: 11 (ref 5–15)
BUN: 14 mg/dL (ref 6–20)
CO2: 27 mmol/L (ref 22–32)
Calcium: 8.6 mg/dL — ABNORMAL LOW (ref 8.9–10.3)
Chloride: 101 mmol/L (ref 98–111)
Creatinine, Ser: 1.13 mg/dL (ref 0.61–1.24)
GFR, Estimated: >60 mL/min (ref >60)
Glucose, Bld: 144 mg/dL — ABNORMAL HIGH (ref 70–99)
Potassium: 2.8 mmol/L — ABNORMAL LOW (ref 3.5–5.1)
Sodium: 139 mmol/L (ref 135–145)
Total Bilirubin: 0.6 mg/dL (ref 0.3–1.2)
Total Protein: 6.7 g/dL (ref 6.5–8.1)

## 2021-12-24 LAB — CBC WITH DIFFERENTIAL/PLATELET
Abs Immature Granulocytes: 0.02 10*3/uL (ref 0.00–0.07)
Basophils Absolute: 0 10*3/uL (ref 0.0–0.1)
Basophils Relative: 1 %
Eosinophils Absolute: 0.2 10*3/uL (ref 0.0–0.5)
Eosinophils Relative: 2 %
HCT: 34.8 % — ABNORMAL LOW (ref 39.0–52.0)
Hemoglobin: 11.6 g/dL — ABNORMAL LOW (ref 13.0–17.0)
Immature Granulocytes: 0 %
Lymphocytes Relative: 18 %
Lymphs Abs: 1.4 10*3/uL (ref 0.7–4.0)
MCH: 29.4 pg (ref 26.0–34.0)
MCHC: 33.3 g/dL (ref 30.0–36.0)
MCV: 88.1 fL (ref 80.0–100.0)
Monocytes Absolute: 0.5 10*3/uL (ref 0.1–1.0)
Monocytes Relative: 6 %
Neutro Abs: 5.7 10*3/uL (ref 1.7–7.7)
Neutrophils Relative %: 73 %
Platelets: 255 10*3/uL (ref 150–400)
RBC: 3.95 MIL/uL — ABNORMAL LOW (ref 4.22–5.81)
RDW: 15 % (ref 11.5–15.5)
WBC: 7.8 10*3/uL (ref 4.0–10.5)
nRBC: 0 % (ref 0.0–0.2)

## 2021-12-24 LAB — LACTIC ACID, PLASMA: Lactic Acid, Venous: 1.2 mmol/L (ref 0.5–1.9)

## 2021-12-24 MED ORDER — POTASSIUM CHLORIDE CRYS ER 20 MEQ PO TBCR: 20.0000 meq | EXTENDED_RELEASE_TABLET | Freq: Two times a day (BID) | ORAL | 0 refills | Status: DC

## 2021-12-24 MED ORDER — IOHEXOL 300 MG/ML  SOLN
100.0000 mL | Freq: Once | INTRAMUSCULAR | Status: AC | PRN
  Administered 2021-12-24: 100 mL via INTRAVENOUS

## 2021-12-24 MED ORDER — SODIUM CHLORIDE 0.9 % IV SOLN: 2.0000 g | Freq: Three times a day (TID) | INTRAVENOUS | Status: DC

## 2021-12-24 MED ORDER — SODIUM CHLORIDE 0.9 % IV SOLN
2.0000 g | INTRAVENOUS | Status: AC
  Administered 2021-12-24: 2 g via INTRAVENOUS
  Filled 2021-12-24: qty 12.5

## 2021-12-24 MED ORDER — POTASSIUM CHLORIDE 10 MEQ/100ML IV SOLN
10.0000 meq | INTRAVENOUS | Status: AC
  Administered 2021-12-24 (×3): 10 meq via INTRAVENOUS
  Filled 2021-12-24 (×3): qty 100

## 2021-12-24 MED ORDER — VANCOMYCIN HCL 1750 MG/350ML IV SOLN
1750.0000 mg | INTRAVENOUS | Status: AC
  Administered 2021-12-24: 1750 mg via INTRAVENOUS
  Filled 2021-12-24: qty 350

## 2021-12-24 MED ORDER — SULFAMETHOXAZOLE-TRIMETHOPRIM 800-160 MG PO TABS: 1.0000 | ORAL_TABLET | Freq: Two times a day (BID) | ORAL | 0 refills | Status: AC

## 2021-12-24 MED ORDER — VANCOMYCIN HCL 1250 MG/250ML IV SOLN
1250.0000 mg | Freq: Two times a day (BID) | INTRAVENOUS | Status: DC
  Filled 2021-12-24: qty 250

## 2021-12-24 NOTE — Progress Notes (Signed)
Pharmacy Antibiotic Note ? ?Andres Richardson is a 56 y.o. male admitted on 12/24/2021 with  wound infection, r/o necrotizing fasciitis .  Pharmacy has been consulted for Vanco, Ceffepime dosing. ? 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 ? ?ID: Wound infection with possible necrotizing fasciitis ?Afebrile, Previous Scr 1 ? ?Vanco 5/11>> ?Ceffepime 5/11>> ? ?Plan: ?Cefepime 2g IV q8hr ?Vanco 1750mg  IV x 1 then ?Vancomycin 1250 mg IV Q 12 hrs. Goal AUC 400-550. ?Expected AUC: 500 ?SCr used: 1 ? ? ? ?Height: 5\' 11"  (180.3 cm) ?Weight: 89.8 kg (198 lb) ?IBW/kg (Calculated) : 75.3 ? ?Temp (24hrs), Avg:97.6 ?F (36.4 ?C), Min:97.6 ?F (36.4 ?C), Max:97.6 ?F (36.4 ?C) ? ?No results for input(s): WBC, CREATININE, LATICACIDVEN, VANCOTROUGH, VANCOPEAK, VANCORANDOM, GENTTROUGH, GENTPEAK, GENTRANDOM, TOBRATROUGH, TOBRAPEAK, TOBRARND, AMIKACINPEAK, AMIKACINTROU, AMIKACIN in the last 168 hours.  ?Estimated Creatinine Clearance: 82.1 mL/min (by C-G formula based on SCr of 1.07 mg/dL).   ? ?Allergies  ?Allergen Reactions  ? Adhesive [Tape] Rash  ? ? ?Camila Norville S. Alford Highland, PharmD, BCPS ?Clinical Staff Pharmacist [email or web address] ? ?Alford Highland, [employer] ?12/24/2021 3:18 PM ? ?

## 2021-12-24 NOTE — ED Provider Notes (Signed)
?[facility]-EMERGENCY DEPT ?Provider Note ? ? ?CSN: [id number] ?Arrival date & time: 12/24/21  1340 ? ?  ? ?History ? ?Chief Complaint  ?Patient presents with  ? Rash  ? ? ?Andres Richardson is a 56 y.o. male. ? ?Patient is a type I diabetic.  He has a history of necrotizing fasciitis in July 2022.  He is here with concern for recurrence of this.  He states last night he noticed some swelling to his left buttock and today is having some bloody drainage with some pain.  He is noticed that he has been having some bleeding from his left buttock.  He is concerned that he could be having necrotizing fasciitis again.  He does have a colostomy from this procedure last time.  Denies any abdominal pain.  Denies any fever, nausea or vomiting. ?Denies any blood thinner use. ? ?The history is provided by the patient.  ?Rash ?Associated symptoms: no abdominal pain, no fever, no headaches, no joint pain, no myalgias, no nausea, no shortness of breath and not vomiting   ? ?  ? ?Home Medications ?Prior to Admission medications   ?Medication Sig Start Date End Date Taking? Authorizing Provider  ?acetaminophen (TYLENOL) 500 MG tablet Take 500 mg by mouth Richardson 6 (six) hours as needed for moderate pain.    [provider]  ?aspirin 81 MG EC tablet Take 1 tablet (81 mg total) by mouth daily. Swallow whole. 11/11/21   Parke Poisson, MD  ?atorvastatin (LIPITOR) 40 MG tablet Take 1 tablet (40 mg total) by mouth daily. 11/11/21   Parke Poisson, MD  ?empagliflozin (JARDIANCE) 10 MG TABS tablet Take 10 mg by mouth daily in the afternoon. 08/03/21   [provider]  ?furosemide (LASIX) 40 MG tablet Take 1 tablet (40 mg total) by mouth daily. 11/11/21   Parke Poisson, MD  ?insulin NPH-regular Human (70-30) 100 UNIT/ML injection Inject 15-30 Units into the skin 2 (two) times daily with a meal. 15 units in AM and 30 units in PM    [provider]  ?Iron, Ferrous Sulfate, 325 (65 Fe) MG TABS  Take 1 tablet by mouth daily. 06/02/21   [provider]  ?isosorbide mononitrate (IMDUR) 30 MG 24 hr tablet Take 1 tablet (30 mg total) by mouth daily. 11/11/21   Parke Poisson, MD  ?metFORMIN (GLUCOPHAGE-XR) 750 MG 24 hr tablet Take 1 tablet (750 mg total) by mouth 2 (two) times daily. 03/10/21   Almon Hercules, MD  ?metoprolol succinate (TOPROL-XL) 25 MG 24 hr tablet Take 1 tablet (25 mg total) by mouth daily. 07/08/21   Marcelino Duster, PA  ?Multiple Vitamin (MULTIVITAMIN WITH MINERALS) TABS tablet Take 1 tablet by mouth daily. 03/10/21   Almon Hercules, MD  ?nitroGLYCERIN (NITROSTAT) 0.4 MG SL tablet Place 0.4 mg under the tongue Richardson 5 (five) minutes as needed for chest pain.    [provider]  ?OVER THE COUNTER MEDICATION Take 1 tablet by mouth daily. Leg Cramps    [provider]  ?pantoprazole (PROTONIX) 40 MG tablet Take 1 tablet (40 mg total) by mouth 2 (two) times daily before a meal for 60 days, THEN 1 tablet (40 mg total) daily. 12/06/21 02/04/23  Rolly Salter, MD  ?potassium chloride SA (KLOR-CON M) 20 MEQ tablet Take 1 tablet (20 mEq total) by mouth daily. 12/07/21   Rolly Salter, MD  ?Semaglutide-Weight Management (WEGOVY) 0.25 MG/0.5ML SOAJ Inject 0.25 mg into the  skin.    [provider]  ?sucralfate (CARAFATE) 1 GM/10ML suspension Take 10 mLs (1 g total) by mouth 2 (two) times daily. 12/06/21   Rolly Salter, MD  ?   ? ?Allergies    ?Adhesive [tape]   ? ?Review of Systems   ?Review of Systems  ?Constitutional:  Negative for activity change, appetite change and fever.  ?HENT:  Negative for congestion and rhinorrhea.   ?Respiratory:  Negative for cough, chest tightness and shortness of breath.   ?Cardiovascular:  Negative for chest pain.  ?Gastrointestinal:  Negative for abdominal pain, nausea and vomiting.  ?Genitourinary:  Negative for dysuria and hematuria.  ?Musculoskeletal:  Negative for arthralgias and myalgias.  ?Skin:  Positive for rash.   ?Neurological:  Negative for dizziness, weakness, light-headedness and headaches.  ? all other systems are negative except as noted in the HPI and PMH.  ? ?Physical Exam ?Updated Vital Signs ?BP 110/68 (BP Location: Left Arm)   Pulse 94   Temp 97.6 ?F (36.4 ?C) (Oral)   Resp 17   Ht 5\' 11"  (1.803 m)   Wt 89.8 kg   SpO2 99%   BMI 27.62 kg/m?  ?Physical Exam ?Vitals and nursing note reviewed.  ?Constitutional:   ?   General: He is not in acute distress. ?   Appearance: He is well-developed.  ?HENT:  ?   Head: Normocephalic and atraumatic.  ?   Mouth/Throat:  ?   Pharynx: No oropharyngeal exudate.  ?Eyes:  ?   Conjunctiva/sclera: Conjunctivae normal.  ?   Pupils: Pupils are equal, round, and reactive to light.  ?Neck:  ?   Comments: No meningismus. ?Cardiovascular:  ?   Rate and Rhythm: Normal rate and regular rhythm.  ?   Heart sounds: Normal heart sounds. No murmur heard. ?Pulmonary:  ?   Effort: Pulmonary effort is normal. No respiratory distress.  ?   Breath sounds: Normal breath sounds.  ?Abdominal:  ?   Palpations: Abdomen is soft.  ?   Tenderness: There is no abdominal tenderness. There is no guarding or rebound.  ?   Comments: Left lower quadrant colostomy, no blood in the bag.  ?Genitourinary: ?   Comments: Erythema throughout gluteal cleft.  There is a opening to his perineum between scrotum and anus with some clear drainage.  No appreciable fluctuance. ?Testicles are nontender ?Musculoskeletal:     ?   General: No tenderness. Normal range of motion.  ?   Cervical back: Normal range of motion and neck supple.  ?Skin: ?   General: Skin is warm.  ?Neurological:  ?   Mental Status: He is alert and oriented to person, place, and time.  ?   Cranial Nerves: No cranial nerve deficit.  ?   Motor: No abnormal muscle tone.  ?   Coordination: Coordination normal.  ?   Comments:  5/5 strength throughout. CN 2-12 intact.Equal grip strength.   ?Psychiatric:     ?   Behavior: Behavior normal.  ? ? ?ED Results /  Procedures / Treatments   ?Labs ?(all labs ordered are listed, but only abnormal results are displayed) ?Labs Reviewed  ?CBC WITH DIFFERENTIAL/PLATELET - Abnormal; Notable for the following components:  ?    Result Value  ? RBC 3.95 (*)   ? Hemoglobin 11.6 (*)   ? HCT 34.8 (*)   ? All other components within normal limits  ?COMPREHENSIVE METABOLIC PANEL - Abnormal; Notable for the following components:  ? Potassium 2.8 (*)   ?  Glucose, Bld 144 (*)   ? Calcium 8.6 (*)   ? Albumin 3.0 (*)   ? AST 13 (*)   ? All other components within normal limits  ?CULTURE, BLOOD (ROUTINE X 2)  ?CULTURE, BLOOD (ROUTINE X 2)  ?LACTIC ACID, PLASMA  ?LACTIC ACID, PLASMA  ?CBG MONITORING, ED  ? ? ?EKG ?None ? ?Radiology ?No results found. ? ?Procedures ?Procedures  ? ? ?Medications Ordered in ED ?Medications - No data to display ? ?ED Course/ Medical Decision Making/ A&P ?  ?                        ?Medical Decision Making ?Amount and/or Complexity of Data Reviewed ?Labs: ordered. ?Radiology: ordered. ? ?Risk ?Prescription drug management. ? ? ?Diabetic with concern for recurrent necrotizing fasciitis.  Vitals are stable, no distress.  No fever ? ?Patient started on empiric broad-spectrum antibiotics after cultures were obtained.  Labs remarkable for hypokalemia which is replaced. ? ?CT scan is ordered for further assessment of his buttocks to evaluate for cellulitis versus abscess versus other acute pathology. ? ?Care to be transferred at shift change to Dr. Jeraldine LootsLockwood.  ? ? ? ? ? ? ? ?Final Clinical Impression(s) / ED Diagnoses ?Final diagnoses:  ?None  ? ? ?Rx / DC Orders ?ED Discharge Orders   ? ? None  ? ?  ? ? ?  ?Glynn Octaveancour, Masen Luallen, MD ?12/24/21 1640 ? ?

## 2021-12-24 NOTE — Discharge Instructions (Addendum)
As discussed, your evaluation today has been largely reassuring.  But, it is important that you monitor your condition carefully, and do not hesitate to return to the ED if you develop new, or concerning changes in your condition. ? ?Otherwise, please follow-up with your physician for appropriate ongoing care.  In particular, discussed today's evaluation and minor electrolyte abnormalities.  Please discuss repeat lab studies to ensure that your potassium level has returned to a normal value. ? ?

## 2021-12-24 NOTE — ED Provider Notes (Signed)
Care of the patient assumed at signout.  On this evaluation the patient's CT scan was reviewed, discussed with him and his male companion.  No evidence for necrotizing fasciitis, drainable deep abscess.  He is awake, alert, states that this does feel different than his prior admission during which he was diagnosed with necrotizing fasciitis. ?He is nearly completed antibiotics, potassium, wounds are complete he will be appropriate for discharge with outpatient follow-up. ?  ?Carmin Muskrat, MD ?12/24/21 1934 ? ?

## 2021-12-24 NOTE — ED Triage Notes (Addendum)
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

## 2021-12-29 LAB — CULTURE, BLOOD (ROUTINE X 2)
Culture: NO GROWTH
Culture: NO GROWTH
Special Requests: ADEQUATE
Special Requests: ADEQUATE

## 2021-12-31 ENCOUNTER — Telehealth: Payer: Self-pay | Admitting: Licensed Clinical Social Worker

## 2021-12-31 NOTE — Telephone Encounter (Signed)
Received update that pt Medicaid is pending: Application Date: 11/03/2021 [facility] DSS Caseworker: Alla Feeling    [phone] [email or web address]  Octavio Graves, MSW, LCSW Clinical Social Worker II [facility] Care Navigation  [phone]- work cell phone (preferred) [phone]- desk phone

## 2022-01-01 ENCOUNTER — Ambulatory Visit
Admission: RE | Admit: 2022-01-01 | Discharge: 2022-01-01 | Disposition: A | Discharge status: Home / Self Care | Payer: Medicaid Other | Source: Ambulatory Visit | Attending: Gastroenterology | Admitting: Gastroenterology

## 2022-01-01 DIAGNOSIS — R1310 Dysphagia, unspecified: Secondary | ICD-10-CM

## 2022-01-01 DIAGNOSIS — K2210 Ulcer of esophagus without bleeding: Secondary | ICD-10-CM

## 2022-02-08 ENCOUNTER — Ambulatory Visit: Payer: Medicaid Other | Admitting: Cardiovascular Disease

## 2022-03-01 ENCOUNTER — Other Ambulatory Visit: Payer: Self-pay | Admitting: Gastroenterology

## 2022-03-10 ENCOUNTER — Ambulatory Visit (INDEPENDENT_AMBULATORY_CARE_PROVIDER_SITE_OTHER): Payer: Medicaid Other | Admitting: Internal Medicine

## 2022-03-10 ENCOUNTER — Encounter: Payer: Self-pay | Admitting: Internal Medicine

## 2022-03-10 VITALS — BP 130/64 | HR 92 | Ht 71.0 in | Wt 212.2 lb

## 2022-03-10 DIAGNOSIS — I209 Angina pectoris, unspecified: Secondary | ICD-10-CM

## 2022-03-10 DIAGNOSIS — E7800 Pure hypercholesterolemia, unspecified: Secondary | ICD-10-CM

## 2022-03-10 DIAGNOSIS — I10 Essential (primary) hypertension: Secondary | ICD-10-CM

## 2022-03-10 DIAGNOSIS — I739 Peripheral vascular disease, unspecified: Secondary | ICD-10-CM | POA: Diagnosis not present

## 2022-03-10 DIAGNOSIS — I25119 Atherosclerotic heart disease of native coronary artery with unspecified angina pectoris: Secondary | ICD-10-CM

## 2022-03-10 DIAGNOSIS — I5032 Chronic diastolic (congestive) heart failure: Secondary | ICD-10-CM

## 2022-03-10 DIAGNOSIS — I5033 Acute on chronic diastolic (congestive) heart failure: Secondary | ICD-10-CM

## 2022-03-10 DIAGNOSIS — G4733 Obstructive sleep apnea (adult) (pediatric): Secondary | ICD-10-CM

## 2022-03-10 DIAGNOSIS — Z720 Tobacco use: Secondary | ICD-10-CM

## 2022-03-10 DIAGNOSIS — E119 Type 2 diabetes mellitus without complications: Secondary | ICD-10-CM | POA: Diagnosis not present

## 2022-03-10 DIAGNOSIS — Z01810 Encounter for preprocedural cardiovascular examination: Secondary | ICD-10-CM

## 2022-03-10 DIAGNOSIS — Z794 Long term (current) use of insulin: Secondary | ICD-10-CM

## 2022-03-10 MED ORDER — ISOSORBIDE MONONITRATE ER 30 MG PO TB24: 90.0000 mg | ORAL_TABLET | Freq: Every day | ORAL | 3 refills | Status: DC

## 2022-03-10 NOTE — Patient Instructions (Signed)
Medication Instructions:  No Changes In Medications at this time.  *If you need a refill on your cardiac medications before your next appointment, please call your pharmacy*  Lab Work: None Ordered At This Time.  If you have labs (blood work) drawn today and your tests are completely normal, you will receive your results only by: MyChart Message (if you have MyChart) OR A paper copy in the mail If you have any lab test that is abnormal or we need to change your treatment, we will call you to review the results.  Testing/Procedures: Your physician has requested that you have an echocardiogram. Echocardiography is a painless test that uses sound waves to create images of your heart. It provides your doctor with information about the size and shape of your heart and how well your heart's chambers and valves are working. You may receive an ultrasound enhancing agent through an IV if needed to better visualize your heart during the echo.This procedure takes approximately one hour. There are no restrictions for this procedure. This will take place at the [address].   Follow-Up: At [facility], you and your health needs are our priority.  As part of our continuing mission to provide you with exceptional heart care, we have created designated Provider Care Teams.  These Care Teams include your primary Cardiologist (physician) and Advanced Practice Providers (APPs -  Physician Assistants and Nurse Practitioners) who all work together to provide you with the care you need, when you need it.  We recommend signing up for the patient portal called "MyChart".  Sign up information is provided on this After Visit Summary.  MyChart is used to connect with patients for Virtual Visits (Telemedicine).  Patients are able to view lab/test results, encounter notes, upcoming appointments, etc.  Non-urgent messages can be sent to your provider as well.   To learn more about what you can do with  MyChart, go to [email or web address].    Your next appointment:   AFTER ECHO   The format for your next appointment:   In Person  Provider:   Parke Poisson, MD   REFERRAL TO [facility]- SOMEONE WILL REACH OUT TO YOU TO GET YOU SCHEDULED
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
## 2022-03-12 ENCOUNTER — Telehealth: Payer: Self-pay

## 2022-03-12 NOTE — Telephone Encounter (Signed)
Attempted to call patient, left message for patient to call back to office.   

## 2022-03-12 NOTE — Telephone Encounter (Signed)
-----   Message from Parke Poisson, MD sent at 03/10/2022 11:03 AM EDT ----- Thanks so much Traci!  Eileen Stanford, please see below from Dr. Mayford Knife, can we make this referral for him? Thanks, [place] ----- Message ----- From: Quintella Reichert, MD Sent: 03/10/2022  10:35 AM EDT To: Lennette Bihari, MD; Parke Poisson, MD  Yes he is definitely a candidate.  Patients need to have a BMI<32 and at least an AHI of 15/hr.  Most insurance companies like them to have tried CPAP for 6 months but typically even if it has not been 6 months I will refer them over.  You can just have your nurse send a referral to Dr. Christia Reading with [facility] and he will evaluate him and then I will follow him post implant.  Dr. Annalee Genta is retiring.  ----- Message ----- From: Parke Poisson, MD Sent: 03/10/2022  10:04 AM EDT To: Lennette Bihari, MD; Quintella Reichert, MD  Cleone Slim Brendia Sacks [place] has seen this pt recently for initiation of CPAP. He is not tolerating it and continues to have HF symptoms. Would he be a candidate for Inspire? I am still learning who is the right candidate for this. Thank you for reviewing! Anselm Jungling

## 2022-03-25 NOTE — Telephone Encounter (Signed)
Attempted to call patient, left message for patient to call back to office.   

## 2022-04-06 ENCOUNTER — Ambulatory Visit (HOSPITAL_COMMUNITY): Payer: Medicaid Other | Attending: Cardiology

## 2022-04-06 DIAGNOSIS — I5033 Acute on chronic diastolic (congestive) heart failure: Secondary | ICD-10-CM | POA: Insufficient documentation

## 2022-04-06 LAB — ECHOCARDIOGRAM COMPLETE
Area-P 1/2: 3.21 cm2
P 1/2 time: 396 msec
S' Lateral: 3.8 cm

## 2022-04-08 ENCOUNTER — Telehealth: Payer: Self-pay | Admitting: Internal Medicine

## 2022-04-08 DIAGNOSIS — I5033 Acute on chronic diastolic (congestive) heart failure: Secondary | ICD-10-CM

## 2022-04-08 NOTE — Telephone Encounter (Signed)
Pt returning nurses call regarding ECHO results. Please advise 

## 2022-04-08 NOTE — Telephone Encounter (Signed)
Attempted to call patient, left message for patient to call back to office.   

## 2022-04-08 NOTE — Telephone Encounter (Signed)
Parke Poisson, MD  04/06/2022  4:53 PM EDT     Preserved LV function but recommend repeat limited echo with definity contrast to define walls. Jenna please arrange.    Patient aware and verbalized understanding.     Paitent reports upcoming procedure scheduled 8/28-colonoscopy +endoscopy with biopsy (reports throat ulcers).  He was aware Dr. Jacques Navy wanted the echocardiogram prior to the procedure.   Advised would send message to MD to see if clearance provided or if additional testing needed prior.    Patient reports no change in symptoms.     Order placed for limited echo with definity

## 2022-04-09 NOTE — Telephone Encounter (Signed)
Per Dr. Jacques Navy: Andres Richardson to wait on repeat echo if he cannot get it scheduled before GI, however should try to get it done as soon as he can.   Per my note:  "He needs a GI evaluation for ulcers per his report. At the current time, the patient would be high risk for general anesthesia, but may be permissible risk for moderate sedation to continue workup of anemia. He is to contact our office in advance of the procedure if his symptoms change. Today he is Class II NYHA HF sx and would be intermediate risk for sedation, with no other recommendations for optimization. Patient has untreated OSA which anesthesia should be aware of for sedation."   He is intermediate risk for GI procedure with moderate sedation, and no further optimization prior to proceeding.    Spoke to patient, aware of Dr. Lupe Richardson recommendations.

## 2022-04-09 NOTE — Telephone Encounter (Signed)
Follow Up:      Patient is returning a call from yesterday. 

## 2022-04-12 ENCOUNTER — Ambulatory Visit (HOSPITAL_COMMUNITY): Admission: RE | Admit: 2022-04-12 | Payer: Medicaid Other | Source: Home / Self Care | Admitting: Gastroenterology

## 2022-04-12 ENCOUNTER — Encounter (HOSPITAL_COMMUNITY): Admission: RE | Payer: Self-pay | Source: Home / Self Care

## 2022-04-12 SURGERY — COLONOSCOPY WITH PROPOFOL
Anesthesia: Monitor Anesthesia Care

## 2022-04-13 ENCOUNTER — Telehealth: Payer: Self-pay | Admitting: Internal Medicine

## 2022-04-13 NOTE — Telephone Encounter (Signed)
Patient is calling stating he is returning a call her got from our office right after 12 pm today. He states it rang 1x and then hung up. Was unable to find documentation as to what the call could have been regarding. Please advise.

## 2022-04-13 NOTE — Telephone Encounter (Signed)
Called patient, advised I did not see any other new documentation in the chart. However, after review of notes it could have been scheduler for ECHO contacting him to get him scheduled.  I advised I would reach out to see if it was them, if not, it could have been reminder phone call for upcoming appointment.   Patient verbalized understanding.   Misty Stanley, did you happen to call him for an appointment? Just checking. Thank you!

## 2022-04-15 ENCOUNTER — Encounter (HOSPITAL_BASED_OUTPATIENT_CLINIC_OR_DEPARTMENT_OTHER): Payer: Self-pay

## 2022-04-15 ENCOUNTER — Emergency Department (HOSPITAL_BASED_OUTPATIENT_CLINIC_OR_DEPARTMENT_OTHER)
Admission: EM | Admit: 2022-04-15 | Discharge: 2022-04-16 | Disposition: A | Discharge status: Home / Self Care | Payer: Medicaid Other | Attending: Emergency Medicine | Admitting: Emergency Medicine

## 2022-04-15 ENCOUNTER — Other Ambulatory Visit: Payer: Self-pay

## 2022-04-15 DIAGNOSIS — K439 Ventral hernia without obstruction or gangrene: Secondary | ICD-10-CM | POA: Diagnosis not present

## 2022-04-15 DIAGNOSIS — I10 Essential (primary) hypertension: Secondary | ICD-10-CM | POA: Diagnosis not present

## 2022-04-15 DIAGNOSIS — Z955 Presence of coronary angioplasty implant and graft: Secondary | ICD-10-CM | POA: Insufficient documentation

## 2022-04-15 DIAGNOSIS — F17210 Nicotine dependence, cigarettes, uncomplicated: Secondary | ICD-10-CM | POA: Diagnosis not present

## 2022-04-15 DIAGNOSIS — E1140 Type 2 diabetes mellitus with diabetic neuropathy, unspecified: Secondary | ICD-10-CM | POA: Diagnosis not present

## 2022-04-15 DIAGNOSIS — R109 Unspecified abdominal pain: Secondary | ICD-10-CM | POA: Diagnosis present

## 2022-04-15 LAB — CBC
HCT: 41.9 % (ref 39.0–52.0)
Hemoglobin: 14.1 g/dL (ref 13.0–17.0)
MCH: 28.3 pg (ref 26.0–34.0)
MCHC: 33.7 g/dL (ref 30.0–36.0)
MCV: 84 fL (ref 80.0–100.0)
Platelets: 215 10*3/uL (ref 150–400)
RBC: 4.99 MIL/uL (ref 4.22–5.81)
RDW: 15.2 % (ref 11.5–15.5)
WBC: 6.9 10*3/uL (ref 4.0–10.5)
nRBC: 0 % (ref 0.0–0.2)

## 2022-04-15 LAB — COMPREHENSIVE METABOLIC PANEL
ALT: 7 U/L (ref 0–44)
AST: 8 U/L — ABNORMAL LOW (ref 15–41)
Albumin: 3.6 g/dL (ref 3.5–5.0)
Alkaline Phosphatase: 89 U/L (ref 38–126)
Anion gap: 10 (ref 5–15)
BUN: 21 mg/dL — ABNORMAL HIGH (ref 6–20)
CO2: 24 mmol/L (ref 22–32)
Calcium: 9.4 mg/dL (ref 8.9–10.3)
Chloride: 99 mmol/L (ref 98–111)
Creatinine, Ser: 1.36 mg/dL — ABNORMAL HIGH (ref 0.61–1.24)
GFR, Estimated: >60 mL/min (ref >60)
Glucose, Bld: 497 mg/dL — ABNORMAL HIGH (ref 70–99)
Potassium: 4.3 mmol/L (ref 3.5–5.1)
Sodium: 133 mmol/L — ABNORMAL LOW (ref 135–145)
Total Bilirubin: 0.3 mg/dL (ref 0.3–1.2)
Total Protein: 7.2 g/dL (ref 6.5–8.1)

## 2022-04-15 LAB — URINALYSIS, ROUTINE W REFLEX MICROSCOPIC
Bilirubin Urine: NEGATIVE
Glucose, UA: >1000 mg/dL — AB
Leukocytes,Ua: NEGATIVE
Nitrite: NEGATIVE
Protein, ur: >300 mg/dL — AB
Specific Gravity, Urine: 1.028 (ref 1.005–1.030)
pH: 6 (ref 5.0–8.0)

## 2022-04-15 LAB — LIPASE, BLOOD: Lipase: 52 U/L — ABNORMAL HIGH (ref 11–51)

## 2022-04-15 NOTE — ED Triage Notes (Signed)
Patient here POV from Home.  Endorses ABD Distention for approximately 1 Month that has been slowly worsening since it began. More Recently the Patient began having Pain to Left ABD where Swelling is located.   Ache-Like Pain. No N/V. 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   NAD Noted during Triage. A&Ox4. GCS 15. Ambulatory.

## 2022-04-16 ENCOUNTER — Emergency Department (HOSPITAL_BASED_OUTPATIENT_CLINIC_OR_DEPARTMENT_OTHER): Payer: Medicaid Other

## 2022-04-16 MED ORDER — IOHEXOL 300 MG/ML  SOLN
80.0000 mL | Freq: Once | INTRAMUSCULAR | Status: AC | PRN
  Administered 2022-04-16: 80 mL via INTRAVENOUS

## 2022-04-16 NOTE — Discharge Instructions (Signed)
You were evaluated in the Emergency Department and after careful evaluation, we did not find any emergent condition requiring admission or further testing in the hospital.  Your exam/testing today is overall reassuring.  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  Recommend follow-up with your surgeons to discuss her symptoms.  Please return to the Emergency Department if you experience any worsening of your condition.   Thank you for allowing [name] to be a part of your care.
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
## 2022-04-20 ENCOUNTER — Ambulatory Visit (HOSPITAL_COMMUNITY): Payer: Medicaid Other | Attending: Internal Medicine

## 2022-04-20 DIAGNOSIS — I5033 Acute on chronic diastolic (congestive) heart failure: Secondary | ICD-10-CM | POA: Diagnosis not present

## 2022-04-20 MED ORDER — PERFLUTREN LIPID MICROSPHERE
1.0000 mL | INTRAVENOUS | Status: AC | PRN
  Administered 2022-04-20: 2 mL via INTRAVENOUS

## 2022-04-21 ENCOUNTER — Encounter: Payer: Self-pay | Admitting: *Deleted

## 2022-05-03 ENCOUNTER — Encounter: Payer: Self-pay | Admitting: Internal Medicine

## 2022-05-03 ENCOUNTER — Ambulatory Visit: Payer: Medicaid Other | Attending: Internal Medicine | Admitting: Internal Medicine

## 2022-05-03 VITALS — BP 118/82 | HR 99 | Ht 71.0 in | Wt 199.0 lb

## 2022-05-03 DIAGNOSIS — Z794 Long term (current) use of insulin: Secondary | ICD-10-CM

## 2022-05-03 DIAGNOSIS — I5033 Acute on chronic diastolic (congestive) heart failure: Secondary | ICD-10-CM

## 2022-05-03 DIAGNOSIS — Z720 Tobacco use: Secondary | ICD-10-CM

## 2022-05-03 DIAGNOSIS — I739 Peripheral vascular disease, unspecified: Secondary | ICD-10-CM | POA: Diagnosis not present

## 2022-05-03 DIAGNOSIS — Z01810 Encounter for preprocedural cardiovascular examination: Secondary | ICD-10-CM | POA: Diagnosis not present

## 2022-05-03 DIAGNOSIS — I10 Essential (primary) hypertension: Secondary | ICD-10-CM

## 2022-05-03 DIAGNOSIS — I25119 Atherosclerotic heart disease of native coronary artery with unspecified angina pectoris: Secondary | ICD-10-CM

## 2022-05-03 DIAGNOSIS — E119 Type 2 diabetes mellitus without complications: Secondary | ICD-10-CM

## 2022-05-03 DIAGNOSIS — I209 Angina pectoris, unspecified: Secondary | ICD-10-CM

## 2022-05-03 DIAGNOSIS — G4733 Obstructive sleep apnea (adult) (pediatric): Secondary | ICD-10-CM

## 2022-05-03 DIAGNOSIS — E7800 Pure hypercholesterolemia, unspecified: Secondary | ICD-10-CM

## 2022-05-03 NOTE — Telephone Encounter (Signed)
Patient seen today 9/18- would not like referral- MD aware. Will close this encounter.
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
## 2022-05-03 NOTE — Patient Instructions (Signed)
Medication Instructions:  No Changes In Medications at this time.  *If you need a refill on your cardiac medications before your next appointment, please call your pharmacy*  Follow-Up: At [facility], you and your health needs are our priority.  As part of our continuing mission to provide you with exceptional heart care, we have created designated Provider Care Teams.  These Care Teams include your primary Cardiologist (physician) and Advanced Practice Providers (APPs -  Physician Assistants and Nurse Practitioners) who all work together to provide you with the care you need, when you need it.  Your next appointment:   6 month(s)  The format for your next appointment:   In Person  Provider:   ANY APP OR DR. ACHARYA

## 2022-05-06 ENCOUNTER — Other Ambulatory Visit: Payer: Self-pay | Admitting: *Deleted

## 2022-05-06 DIAGNOSIS — M79604 Pain in right leg: Secondary | ICD-10-CM

## 2022-05-07 ENCOUNTER — Ambulatory Visit (HOSPITAL_COMMUNITY)
Admission: RE | Admit: 2022-05-07 | Discharge: 2022-05-07 | Disposition: A | Discharge status: Home / Self Care | Payer: Medicaid Other | Source: Ambulatory Visit | Attending: Vascular Surgery | Admitting: Vascular Surgery

## 2022-05-07 DIAGNOSIS — M79605 Pain in left leg: Secondary | ICD-10-CM | POA: Diagnosis present

## 2022-05-07 DIAGNOSIS — M79604 Pain in right leg: Secondary | ICD-10-CM | POA: Insufficient documentation
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
## 2022-05-14 ENCOUNTER — Ambulatory Visit (INDEPENDENT_AMBULATORY_CARE_PROVIDER_SITE_OTHER): Payer: Medicaid Other | Admitting: Vascular Surgery

## 2022-05-14 ENCOUNTER — Encounter: Payer: Self-pay | Admitting: Vascular Surgery

## 2022-05-14 ENCOUNTER — Encounter (HOSPITAL_COMMUNITY): Payer: Medicaid Other

## 2022-05-14 VITALS — BP 133/78 | HR 107 | Temp 97.8°F | Resp 20 | Ht 71.0 in | Wt 200.0 lb

## 2022-05-14 DIAGNOSIS — I739 Peripheral vascular disease, unspecified: Secondary | ICD-10-CM

## 2022-05-17 ENCOUNTER — Telehealth: Payer: Self-pay | Admitting: Licensed Clinical Social Worker

## 2022-05-17 ENCOUNTER — Telehealth: Payer: Self-pay | Admitting: Internal Medicine

## 2022-05-17 ENCOUNTER — Telehealth: Payer: Self-pay | Admitting: Vascular Surgery

## 2022-05-17 NOTE — Telephone Encounter (Signed)
Patient would like to set up an appt to speak with Dr. Margaretann Loveless about what the vein specialists spoke to him about.  He has an appt schedule in March but his feels it can't wait until then.  I wasn't able to find anything sooner than January. He states what he was told at the vein specialists has him very nervous.

## 2022-05-17 NOTE — Telephone Encounter (Signed)
Returned call to patient who states that he went to the vein specialist on Friday and states he is now scared to death. Patient states that he was told that "he was a mess" and that no surgery would ever be an option because of his heart/overall health. Patient states he is now very scared and would like to see Dr. Margaretann Loveless just to discuss his vascular appointment. Scheduled patient to see Dr. Margaretann Loveless on 06/18/22.  Patient also states that he needs a new PCP and would like one within the system so that they are able to see all of his other appointments/office visit notes. Spoke with Issabel who will reach out to patient in regard to this. Patient aware and verbalized understanding.

## 2022-05-17 NOTE — Telephone Encounter (Deleted)
Pt came in complaining of foot pain from a corn and wanted to see Dr. Stanford Breed. Explained he was not here and pt got very upset. He said he could hardly walk ,etc.  I offered to call [facility] and he said he had already been there and they didn't do anything. I called pt daughter who didn't know he had left the house. I called [facility] and he had been seen there earlier in the week and they explained

## 2022-05-17 NOTE — Telephone Encounter (Signed)
A user error has taken place: encounter opened in error, closed for administrative reasons.

## 2022-05-17 NOTE — Telephone Encounter (Signed)
H&V Care Navigation CSW Progress Note  Clinical Social Worker contacted patient by phone to f/u on information about new PCP. I left voicemail letting pt know I have mailed him new information about PCP resources today. If he is interested in further assistance I provided my name and number.   Patient is participating in a Managed Medicaid Plan:  Yes- Sales executive.   SDOH Screenings   Food Insecurity: No Food Insecurity (09/02/2021)  Housing: Low Risk  (09/02/2021)  Transportation Needs: No Transportation Needs (09/02/2021)  Financial Resource Strain: High Risk (09/02/2021)  Tobacco Use: High Risk (05/14/2022)   Andres Richardson, MSW, [place]  [phone]- work cell phone (preferred) [phone]- desk phone

## 2022-05-18 ENCOUNTER — Telehealth: Payer: Self-pay | Admitting: Gastroenterology

## 2022-05-18 ENCOUNTER — Other Ambulatory Visit: Payer: Self-pay

## 2022-05-18 ENCOUNTER — Telehealth: Payer: Self-pay | Admitting: Licensed Clinical Social Worker

## 2022-05-18 DIAGNOSIS — I739 Peripheral vascular disease, unspecified: Secondary | ICD-10-CM

## 2022-05-18 NOTE — Telephone Encounter (Signed)
H&V Care Navigation CSW Progress Note  Clinical Social Worker  was contacted back by pt  to f/u on primary care resources. LCSW was able to discuss resources mailed, how to utilize and encouraged him to reach out to [facility] to ensure providers are in network with an PCP he may choose. Pt inquiring about PCS referral- he thinks he may need someone to "check in on him," when his friend returns to work. LCSW again encouraged pt to reach out to [facility] to see if pt can get further assistance with clarifying what is needed and how to refer.   Patient is participating in a Managed Medicaid Plan:    [place]: No Food Insecurity (09/02/2021)  Housing: Low Risk  (09/02/2021)  Transportation Needs: No Transportation Needs (09/02/2021)  Financial Resource Strain: High Risk (09/02/2021)  Tobacco Use: High Risk (05/14/2022)    Westley Hummer, MSW, [place]  [phone]- work cell phone (preferred) [phone]- desk phone

## 2022-05-18 NOTE — Telephone Encounter (Signed)
H&V Care Navigation CSW Progress Note  Clinical Social Worker contacted patient by phone as he called me twice while I was managing other pt concerns. I called pt back three minutes after to f/u on pcp request, no answer. Left additional voicemail for pt, requested he leave message if he called and I was unable to answer.   Patient is participating in a Managed Medicaid Plan:  Yes- [employer] Medicaid  [place]: No Food Insecurity (09/02/2021)  Housing: Low Risk  (09/02/2021)  Transportation Needs: No Transportation Needs (09/02/2021)  Financial Resource Strain: High Risk (09/02/2021)  Tobacco Use: High Risk (05/14/2022)   Westley Hummer, MSW, [place]  [phone]- work cell phone (preferred) [phone]- desk phone

## 2022-05-18 NOTE — Telephone Encounter (Signed)
Good Morning Dr.Cunningham,  Supervising MD 6/23  We received a referral on this patient to have a colonoscopy. The patient has been seen by [facility] but states they do not accept his insurance.  We have records for review, please advise on scheduling. Thank you.

## 2022-05-26 NOTE — Telephone Encounter (Signed)
Per Dr. Candis Schatz patient can be scheduled for office visit.

## 2022-05-27 ENCOUNTER — Encounter: Payer: Self-pay | Admitting: Podiatry

## 2022-05-27 ENCOUNTER — Ambulatory Visit (INDEPENDENT_AMBULATORY_CARE_PROVIDER_SITE_OTHER): Payer: Medicaid Other | Admitting: Podiatry

## 2022-05-27 DIAGNOSIS — L97512 Non-pressure chronic ulcer of other part of right foot with fat layer exposed: Secondary | ICD-10-CM | POA: Diagnosis not present

## 2022-05-27 DIAGNOSIS — Z794 Long term (current) use of insulin: Secondary | ICD-10-CM

## 2022-05-27 DIAGNOSIS — E1165 Type 2 diabetes mellitus with hyperglycemia: Secondary | ICD-10-CM | POA: Diagnosis not present

## 2022-05-27 MED ORDER — GENTAMICIN SULFATE 0.1 % EX OINT: 1.0000 | TOPICAL_OINTMENT | Freq: Every day | CUTANEOUS | 2 refills | Status: DC

## 2022-05-28 ENCOUNTER — Telehealth: Payer: Self-pay | Admitting: Internal Medicine

## 2022-05-28 NOTE — Telephone Encounter (Signed)
Pt returning call from [place]

## 2022-05-28 NOTE — Telephone Encounter (Signed)
Please see other telephone encounter.

## 2022-05-28 NOTE — Telephone Encounter (Signed)
Attempted to call patient to reschedule appointment-  left message for patient to call back.

## 2022-05-28 NOTE — Telephone Encounter (Signed)
Called and spoke with patient, gave patient Dr. Delphina Cahill recommendations. Patient is scheduled to a new primary care physician on 06/02/22. Appointment for 06/18/22 cancelled. Patient very grateful for return call and will see Dr. Margaretann Loveless back in 6 Months.  Advised patient to call back to office with any issues, questions, or concerns. Patient verbalized understanding.

## 2022-05-30 NOTE — Progress Notes (Signed)
  Subjective:  Patient ID: DEX BLAKELY, male    DOB: 06/09/1966,  MRN: [id number]  Chief Complaint  Patient presents with   Diabetes    New patient - at risk diabetic foot care/ pre-ulcerative callus    56 y.o. male presents with the above complaint. History confirmed with patient.   Objective:  Physical Exam: Weakly palpable pedal pulses has a large ulceration underneath a large callus measuring 3.6 x 2.5 cm x 0.3 cm, exposed subcutaneous tissue, no cellulitis or signs of infection      Assessment:  No diagnosis found.   Plan:  Patient was evaluated and treated and all questions answered.   Ulcer right foot -We discussed the etiology and factors that are a part of the wound healing process.  We also discussed the risk of infection both soft tissue and osteomyelitis from open ulceration.  Discussed the risk of limb loss if this happens or worsens. -Debridement as below. -Dressed with Iodosorb, DSD. -Continue home dressing changes  3 times weekly with 4 x 4 gauze and gentamicin ointment -Continue off-loading with surgical shoe and peg assist device.  These were dispensed today -Vascular testing has had noninvasive testings follow-up if does not show appropriate wound healing will need angiography -HgbA1c: He does not know his last A1c  -Take x-rays next visit  Procedure: Excisional Debridement of Wound Rationale: Removal of non-viable soft tissue from the wound to promote healing.  Anesthesia: none Post-Debridement Wound Measurements: Noted above Type of Debridement: Sharp Excisional Tissue Removed: Non-viable soft tissue Depth of Debridement: subcutaneous tissue. Technique: Sharp excisional debridement to bleeding, viable wound base.  Dressing: Dry, sterile, compression dressing. Disposition: Patient tolerated procedure well.    Return in about 3 weeks (around 06/17/2022) for wound care.       Return in about 3 weeks (around 06/17/2022) for wound care.

## 2022-06-02 ENCOUNTER — Ambulatory Visit: Payer: Medicaid Other | Admitting: Family Medicine

## 2022-06-02 ENCOUNTER — Encounter: Payer: Self-pay | Admitting: Family Medicine

## 2022-06-02 VITALS — BP 100/62 | HR 98 | Temp 97.7°F | Ht 71.0 in | Wt 202.6 lb

## 2022-06-02 DIAGNOSIS — F17210 Nicotine dependence, cigarettes, uncomplicated: Secondary | ICD-10-CM | POA: Diagnosis not present

## 2022-06-02 DIAGNOSIS — E1165 Type 2 diabetes mellitus with hyperglycemia: Secondary | ICD-10-CM

## 2022-06-02 DIAGNOSIS — Z7984 Long term (current) use of oral hypoglycemic drugs: Secondary | ICD-10-CM | POA: Diagnosis not present

## 2022-06-02 DIAGNOSIS — S91301A Unspecified open wound, right foot, initial encounter: Secondary | ICD-10-CM | POA: Diagnosis not present

## 2022-06-02 DIAGNOSIS — Z0000 Encounter for general adult medical examination without abnormal findings: Secondary | ICD-10-CM | POA: Insufficient documentation

## 2022-06-02 DIAGNOSIS — Z794 Long term (current) use of insulin: Secondary | ICD-10-CM

## 2022-06-02 DIAGNOSIS — Z933 Colostomy status: Secondary | ICD-10-CM

## 2022-06-02 DIAGNOSIS — I5031 Acute diastolic (congestive) heart failure: Secondary | ICD-10-CM

## 2022-06-02 DIAGNOSIS — E119 Type 2 diabetes mellitus without complications: Secondary | ICD-10-CM

## 2022-06-02 LAB — POCT GLYCOSYLATED HEMOGLOBIN (HGB A1C): Hemoglobin A1C: 12.9 % — AB (ref 4.0–5.6)

## 2022-06-02 LAB — GLUCOSE, CAPILLARY: Glucose-Capillary: 463 mg/dL — ABNORMAL HIGH (ref 70–99)

## 2022-06-02 NOTE — Patient Instructions (Signed)
We spoke today on closely monitoring blood sugar and blood pressure Bring blood pressure and blood glucose log to clinic on your follow-up visit We also discussed dietary modification and healthy food choices including low-salt and low-carb diet.

## 2022-06-02 NOTE — Assessment & Plan Note (Signed)
Show also reports he is experiencing dysphagia and increase dyspepsia.  Difficulty swallowing to both solids and liquids.  Denies odynophagia.  Patient is following up with a gastroenterologist for management of his GI symptoms.  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  -Symptoms could also be from gastroparesis from long standing poorly controlled diabetes.  Patient counseled on good glycemic control.  Consume small meals.  Continue to follow-up with GI for further management of dysphagia.

## 2022-06-02 NOTE — Assessment & Plan Note (Addendum)
Pt with H/O extensive cardiovascular disease, PAD, presented to [facility] clinic for initial evaluation.  Patient complaining of shortness of breath worse with physical activity.  Patient was able to walk a block without getting short of breath however lately he gets short of breath walking short distance and sometimes with ADLs.  Patient denies chest pain, palpitations, dizziness, nausea, vomiting or diaphoresis.  Patient was evaluated by cardiologist on May 03, 2022 for preop cardiovascular examination for colostomy reversal surgery.  Patient had cardiac cath in October 2022 with various degrees of stenosis of the coronary arteries.  Most recent echo in September 2023 demonstrated EF 60 to 65%. Patient will continue to follow with cardiologist for further management of cardiovascular disease and for clearance for future colostomy reversal surgery. Lungs clear to auscultation.  No pedal or lower extremity edema.  No signs of volume overload. Patient's symptoms are well managed by metoprolol, Imdur, furosemide.  No changes to the treatment regimen

## 2022-06-02 NOTE — Assessment & Plan Note (Signed)
Patient has large ulceration to the plantar aspect of right foot.  Patient is following up with podiatrist.  Wound was debrided by the podiatrist and wound care instructions were given to the patient.  Patient clean and dressed the wound daily.  He use topical gentamicin 0.1% prescribed by the podiatrist.  Patient is scheduled to follow-up with the podiatrist on June 17, 2022 for wound check. I have discussed in detail the importance of the wound care and close follow-up with the podiatrist for management of the wound.
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
## 2022-06-02 NOTE — Assessment & Plan Note (Signed)
Patient's hemoglobin A1c today 12.9.  Patient's A1c in April 2023 was 12.9.  Patient's diabetes is not well controlled.  He does not check blood sugar on regular basis. Patient reports he is compliant with his medications.  Patient admitted he has poor eating habits which is likely contributing to her poor diabetic control.  Patient was on Ozempic.  He was getting coupons from his PCP for Ozempic.  Patient stopped getting coupons for past 2 months.  Patient stated he just got back on Ozempic last Saturday and he started on 0.25 mg which he will titrate up every 4 weeks. His other diabetes medications includes NPH 70-30.  Patient inject 20 units in the morning and 30 units at night.  He takes metformin 750 twice daily. -Patient was counseled to monitor blood sugar twice daily. -We will titrate Ozempic every 4-week to optimize glycemic control. -Patient will bring his glucose monitor on the next visit -Educated on diet modification including low carb and sugar free diet.

## 2022-06-03 NOTE — Progress Notes (Signed)
Internal Medicine Clinic Attending  I saw and evaluated the patient.  I personally confirmed the key portions of the history and exam documented by Dr. Multani and I reviewed pertinent patient test results.  The assessment, diagnosis, and plan were formulated together and I agree with the documentation in the resident's note.  

## 2022-06-09 ENCOUNTER — Telehealth: Payer: Self-pay | Admitting: Vascular Surgery

## 2022-06-09 NOTE — Telephone Encounter (Signed)
-----   Message from Broadus John, MD sent at 06/08/2022  9:39 AM EDT ----- Needs appointment in 2-4 weeks please

## 2022-06-10 NOTE — Telephone Encounter (Signed)
Called patient to schedule left voicemail. 

## 2022-06-11 ENCOUNTER — Encounter: Payer: Self-pay | Admitting: Gastroenterology
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
## 2022-06-18 ENCOUNTER — Encounter: Payer: Self-pay | Admitting: Vascular Surgery

## 2022-06-18 ENCOUNTER — Ambulatory Visit (INDEPENDENT_AMBULATORY_CARE_PROVIDER_SITE_OTHER): Payer: Medicaid Other | Admitting: Vascular Surgery

## 2022-06-18 ENCOUNTER — Ambulatory Visit: Payer: Medicaid Other | Admitting: Internal Medicine

## 2022-06-18 VITALS — BP 92/60 | Temp 99.7°F | Resp 18 | Ht 71.0 in | Wt 204.0 lb

## 2022-06-18 DIAGNOSIS — I739 Peripheral vascular disease, unspecified: Secondary | ICD-10-CM | POA: Diagnosis not present

## 2022-06-23 ENCOUNTER — Ambulatory Visit (INDEPENDENT_AMBULATORY_CARE_PROVIDER_SITE_OTHER): Payer: Medicaid Other | Admitting: Podiatry

## 2022-06-23 DIAGNOSIS — L97512 Non-pressure chronic ulcer of other part of right foot with fat layer exposed: Secondary | ICD-10-CM

## 2022-06-24 ENCOUNTER — Other Ambulatory Visit: Payer: Self-pay

## 2022-06-24 DIAGNOSIS — I739 Peripheral vascular disease, unspecified: Secondary | ICD-10-CM

## 2022-06-24 NOTE — Progress Notes (Signed)
Error

## 2022-06-25 NOTE — Progress Notes (Signed)
  Subjective:  Patient ID: Andres Richardson, male    DOB: 07-07-1966,  MRN: [id number]  Chief Complaint  Patient presents with   Foot Ulcer    3 week follow up right foot    56 y.o. male presents with the above complaint. History confirmed with patient.   Objective:  Physical Exam: Weakly palpable pedal pulses has a large ulceration underneath a large callus measuring 0.8 x 0.4 x 0.2 cm, exposed subcutaneous tissue, no cellulitis or signs of infection      Assessment:   1. Ulcer of right foot with fat layer exposed (HCC)      Plan:  Patient was evaluated and treated and all questions answered.   Ulcer right foot -We discussed the etiology and factors that are a part of the wound healing process.  We also discussed the risk of infection both soft tissue and osteomyelitis from open ulceration.  Discussed the risk of limb loss if this happens or worsens. -Debridement as below. -Dressed with Iodosorb, DSD. -Continue home dressing changes  3 times weekly with 4 x 4 gauze and gentamicin ointment -Continue off-loading with surgical shoe and peg assist device.  These were dispensed today -Vascular flow appears to be adequate, he saw Dr. Karin Lieu again for reevaluation -HgbA1c: 12.9% which I expect is why this is not healing or recurring -Take x-rays next visit  Procedure: Excisional Debridement of Wound Rationale: Removal of non-viable soft tissue from the wound to promote healing.  Anesthesia: none Post-Debridement Wound Measurements: Noted above Type of Debridement: Sharp Excisional Tissue Removed: Non-viable soft tissue Depth of Debridement: subcutaneous tissue. Technique: Sharp excisional debridement to bleeding, viable wound base.  Dressing: Dry, sterile, compression dressing. Disposition: Patient tolerated procedure well.       Return in about 3 weeks (around 07/14/2022) for wound care.

## 2022-07-05 ENCOUNTER — Ambulatory Visit: Payer: Medicaid Other | Admitting: Student

## 2022-07-05 VITALS — BP 142/73 | HR 91 | Temp 97.8°F | Ht 71.0 in | Wt 201.0 lb

## 2022-07-05 DIAGNOSIS — S91301A Unspecified open wound, right foot, initial encounter: Secondary | ICD-10-CM

## 2022-07-05 DIAGNOSIS — Z794 Long term (current) use of insulin: Secondary | ICD-10-CM | POA: Diagnosis not present

## 2022-07-05 DIAGNOSIS — R030 Elevated blood-pressure reading, without diagnosis of hypertension: Secondary | ICD-10-CM

## 2022-07-05 DIAGNOSIS — E1165 Type 2 diabetes mellitus with hyperglycemia: Secondary | ICD-10-CM

## 2022-07-05 MED ORDER — DEXCOM G7 SENSOR MISC: 1.0000 [IU] | 3 refills | Status: AC

## 2022-07-05 MED ORDER — DEXCOM G7 RECEIVER DEVI: 1.0000 [IU] | Freq: Once | 0 refills | Status: AC

## 2022-07-05 NOTE — Patient Instructions (Signed)
Diabetes  Please stop taking jardiance and ozempic We will work on getting you a CGM monitor and a visit with Lupita Leash our diabetic coordinator  Please follow up for diabetes in 4 weeks

## 2022-07-06 ENCOUNTER — Telehealth: Payer: Self-pay

## 2022-07-06 DIAGNOSIS — R030 Elevated blood-pressure reading, without diagnosis of hypertension: Secondary | ICD-10-CM | POA: Insufficient documentation

## 2022-07-06 DIAGNOSIS — E1165 Type 2 diabetes mellitus with hyperglycemia: Secondary | ICD-10-CM

## 2022-07-06 NOTE — Assessment & Plan Note (Signed)
Large ulceration with surrounding callus of the right foot. Had debridement with podiatry on 11/8. Offloading with boot. He is doing daily dressing changes with gentamicin ointment. No drainage, warmth or erythema. Has follow up with podiatry in 2 weeks.

## 2022-07-06 NOTE — Telephone Encounter (Signed)
Prior Authorization for patient (Dexcom G7 Sensor/Receiver device) came through on cover my meds, drug request form has been given to Dr.Liang to sign awaiting to receive form back to submit prior authorization

## 2022-07-06 NOTE — Assessment & Plan Note (Signed)
Did not bring meter and has not check his blood sugars. Started on Ozempic reports no appetite with this. Has had 2 doses since last visit. Discussed that this may improve with time but he is concern eating irregularly will send him into DKA again and uncomfortable with continuing. He is also on jardiance despite history of ketosis prone DM and fournier's gangrene. Unsafe for him to continue SLGT-2. Would like to try a CGM to get better ideas of the glucose levels to help with adjusting his medications.  - stop ozempic and jardiance  -Continue NPH - Dexcom  7 with receiver ordered - referral to diabetes educator  -follow up in 1 month

## 2022-07-06 NOTE — Telephone Encounter (Signed)
Drug request form along with last office notes has been faxed to [facility] @[phone]

## 2022-07-06 NOTE — Assessment & Plan Note (Signed)
Elevated BP of 142/73, similar on repeat. In past has mostly low pressure. Will continue to monitor this. If persistently elevated may benefit from antihypertensive.

## 2022-07-06 NOTE — Progress Notes (Signed)
Established Patient Office Visit  Subjective   Patient ID: Andres Richardson, male    DOB: 1966/08/10  Age: 56 y.o. MRN: [id number]  Chief Complaint  Patient presents with   Medication Refill    Andres Richardson is a 56 y.o. person living with a history listed below who present to clinic for diabetes follow up. Please refer to problem based charting for further details and assessment and plan of current problem and chronic medical conditions.     Patient Active Problem List   Diagnosis Date Noted   Elevated blood pressure reading in office without diagnosis of hypertension 07/06/2022   Wound of right foot 06/02/2022   Healthcare maintenance 06/02/2022   Uncontrolled type 2 diabetes mellitus with hyperglycemia, with long-term current use of insulin (HCC) 12/03/2021   Colostomy in place [facility]) 12/03/2021   Grade I diastolic dysfunction 12/02/2021   Hyperkalemia 12/02/2021   Hyperbilirubinemia 12/02/2021   AKI (acute kidney injury) (HCC) 12/02/2021   Hypernatremia 12/02/2021   Hematemesis 12/02/2021   Acute respiratory failure with hypoxia (HCC) 05/15/2021   Hyperlipidemia 05/15/2021   CAD (coronary artery disease) 05/15/2021   Acute diastolic CHF (congestive heart failure) (HCC) 05/15/2021   Acute on chronic respiratory failure with hypoxia and hypercapnia (HCC)    Sepsis (HCC) 05/10/2021   Acute respiratory failure with hypoxia and hypercapnia (HCC) 05/10/2021   Multifocal pneumonia    Fournier's gangrene 02/23/2021   DKA (diabetic ketoacidosis) (HCC) 02/22/2021   DKA (diabetic ketoacidoses) 05/11/2019      ROS: negative as per HPI    Objective:     BP (!) 142/73 (BP Location: Right Arm, Patient Position: Sitting, Cuff Size: Small)   Pulse 91   Temp 97.8 F (36.6 C) (Oral)   Ht 5\' 11"  (1.803 m)   Wt 201 lb (91.2 kg)   SpO2 98%   BMI 28.03 kg/m  BP Readings from Last 3 Encounters:  07/05/22 (!) 142/73  06/18/22 92/60  06/02/22 100/62      Physical  Exam Constitutional:      Appearance: Normal appearance.  HENT:     Head: Normocephalic and atraumatic.     Mouth/Throat:     Mouth: Mucous membranes are moist.     Pharynx: Oropharynx is clear.  Cardiovascular:     Rate and Rhythm: Normal rate and regular rhythm.  Pulmonary:     Effort: Pulmonary effort is normal.     Breath sounds: No rhonchi or rales.  Abdominal:     General: Bowel sounds are normal. There is no distension.     Palpations: Abdomen is soft.     Tenderness: There is no abdominal tenderness.     Comments: Abdominal hernia, colostomy in place  Musculoskeletal:        General: Normal range of motion.     Right lower leg: No edema.     Left lower leg: No edema.  Skin:    Capillary Refill: Capillary refill takes less than 2 seconds.     Comments: Ulceration of the right foot at the base first metarsal. No warmth, drainage, or erythema, foot is cool, weak DP pulse  Neurological:     General: No focal deficit present.     Mental Status: He is alert and oriented to person, place, and time.  Psychiatric:        Mood and Affect: Mood normal.        Behavior: Behavior normal.      No results found for any  visits on 07/05/22.  Last metabolic panel Lab Results  Component Value Date   GLUCOSE 497 (H) 04/15/2022   NA 133 (L) 04/15/2022   K 4.3 04/15/2022   CL 99 04/15/2022   CO2 24 04/15/2022   BUN 21 (H) 04/15/2022   CREATININE 1.36 (H) 04/15/2022   GFRNONAA >60 04/15/2022   CALCIUM 9.4 04/15/2022   PHOS 3.5 12/03/2021   PROT 7.2 04/15/2022   ALBUMIN 3.6 04/15/2022   BILITOT 0.3 04/15/2022   ALKPHOS 89 04/15/2022   AST 8 (L) 04/15/2022   ALT 7 04/15/2022   ANIONGAP 10 04/15/2022   Last hemoglobin A1c Lab Results  Component Value Date   HGBA1C 12.9 (A) 06/02/2022      The ASCVD Risk score (Arnett DK, et al., 2019) failed to calculate for the following reasons:   The valid total cholesterol range is 130 to 320 mg/dL    Assessment & Plan:    Problem List Items Addressed This Visit       Endocrine   Uncontrolled type 2 diabetes mellitus with hyperglycemia, with long-term current use of insulin (HCC) - Primary    Did not bring meter and has not check his blood sugars. Started on Ozempic reports no appetite with this. Has had 2 doses since last visit. Discussed that this may improve with time but he is concern eating irregularly will send him into DKA again and uncomfortable with continuing. He is also on jardiance despite history of ketosis prone DM and fournier's gangrene. Unsafe for him to continue SLGT-2. Would like to try a CGM to get better ideas of the glucose levels to help with adjusting his medications.  - stop ozempic and jardiance  -Continue NPH - Dexcom  7 with receiver ordered - referral to diabetes educator  -follow up in 1 month      Relevant Orders   Ambulatory referral to Ophthalmology   Ambulatory referral to diabetic education     Other   Wound of right foot    Large ulceration with surrounding callus of the right foot. Had debridement with podiatry on 11/8. Offloading with boot. He is doing daily dressing changes with gentamicin ointment. No drainage, warmth or erythema. Has follow up with podiatry in 2 weeks.       Elevated blood pressure reading in office without diagnosis of hypertension    Elevated BP of 142/73, similar on repeat. In past has mostly low pressure. Will continue to monitor this. If persistently elevated may benefit from antihypertensive.        Return in about 4 weeks (around 08/02/2022).    Quincy Simmonds, MD

## 2022-07-13 ENCOUNTER — Telehealth: Payer: Self-pay | Admitting: Licensed Clinical Social Worker

## 2022-07-13 DIAGNOSIS — E119 Type 2 diabetes mellitus without complications: Secondary | ICD-10-CM

## 2022-07-13 DIAGNOSIS — I10 Essential (primary) hypertension: Secondary | ICD-10-CM

## 2022-07-13 DIAGNOSIS — I25119 Atherosclerotic heart disease of native coronary artery with unspecified angina pectoris: Secondary | ICD-10-CM

## 2022-07-13 DIAGNOSIS — I739 Peripheral vascular disease, unspecified: Secondary | ICD-10-CM

## 2022-07-13 DIAGNOSIS — E7800 Pure hypercholesterolemia, unspecified: Secondary | ICD-10-CM

## 2022-07-13 DIAGNOSIS — G4733 Obstructive sleep apnea (adult) (pediatric): Secondary | ICD-10-CM

## 2022-07-13 DIAGNOSIS — I5033 Acute on chronic diastolic (congestive) heart failure: Secondary | ICD-10-CM

## 2022-07-13 NOTE — Telephone Encounter (Signed)
H&V Care Navigation CSW Progress Note  Clinical Social Worker  was contacted by pt  to request refills for medications. Since pt had [facility] [place] he is trying to figure out what to do. Pt now has [facility], needs to utilize local pharmacy. I have updated pt with this information I will request medications be sent to [facility] pharmacy for refills.  Pt aware, agreeable, has switched PCP to [facility] with [facility]- is pleased with this change at this time.   Patient is participating in a Managed Medicaid Plan:  Yes, [employer]  SDOH Screenings   Food Insecurity: No Food Insecurity (09/02/2021)  Housing: Low Risk  (09/02/2021)  Transportation Needs: No Transportation Needs (09/02/2021)  Depression (PHQ2-9): Low Risk  (07/05/2022)  Financial Resource Strain: High Risk (09/02/2021)  Tobacco Use: High Risk (06/18/2022)    Octavio Graves, MSW, LCSW Clinical Social Worker II [facility] Heart/Vascular Care Navigation  [phone]- work cell phone (preferred) [phone]- desk phone

## 2022-07-13 NOTE — Progress Notes (Signed)
[facility] Attending ? ?Case discussed with Dr. Liang  At the time of the visit.  We reviewed the resident?s history and exam and pertinent patient test results.  I agree with the assessment, diagnosis, and plan of care documented in the resident?s note. ? ?

## 2022-07-14 NOTE — Telephone Encounter (Signed)
Call for appeal of [place] denial of Dexcom G7 sensor. It was decided to resubmit the PA. Rationale: Insulin requiring diabetes E11.65, two injections per day of insulin, uncontrolled diabetes as evidenced by A1c if 12.9 and 12.6 in 2023, 2 episodes DKA. Updated PA# [id number] for resubmitted PA  Pharmacy calls back and wants to know where patient gets insulin because they do not see nay claims for it.  Also need to know if patient will needs a Dexcom G7 receiver prescription. Message left for patient.

## 2022-07-14 NOTE — Telephone Encounter (Signed)
Left message to appeal denial of Dexcom G7

## 2022-07-15 MED ORDER — FUROSEMIDE 40 MG PO TABS: 40.0000 mg | ORAL_TABLET | Freq: Every day | ORAL | 1 refills | Status: DC

## 2022-07-15 MED ORDER — ISOSORBIDE MONONITRATE ER 30 MG PO TB24: 90.0000 mg | ORAL_TABLET | Freq: Every day | ORAL | 3 refills | Status: DC

## 2022-07-15 MED ORDER — ASPIRIN 81 MG PO TBEC: 81.0000 mg | DELAYED_RELEASE_TABLET | Freq: Every day | ORAL | 11 refills | Status: DC

## 2022-07-15 MED ORDER — ATORVASTATIN CALCIUM 40 MG PO TABS: 40.0000 mg | ORAL_TABLET | Freq: Every day | ORAL | 1 refills | Status: DC

## 2022-07-15 NOTE — Telephone Encounter (Addendum)
  Can we have the following sent to the [facility] he has listed on [employer]? Now that he has insurance has to use that instead of [facility]- aspirin, atorvastatin, furosemide, isosorbide mononitrate.  Thank you!    Refills sent to preferred pharmacy electronically   Per last OV note with Dr. Jacques Navy.  Acute on chronic diastolic heart failure - eating highly processed foods, frozen dinners, though is trying to improve this. Has done much better since last visit.  - swelling in hands and legs managed by lasix, continue 40 mg, much improved today.    CAD CTO of RCA -Continue aspirin, Imdur, beta-blocker, statin. Imdur well tolerated at 90 mg daily.     Hypertension -BP controlled, continue lasix, imdur, metoprolol.      Hyperlipidemia with LDL goal less than 70 -Continue statin. LDL 46, Trig elevated. Discussed risk factors for hypertriglyceridemia.

## 2022-07-15 NOTE — Telephone Encounter (Signed)
Decision:Approved  Dexcom G7 Receiver device a quantity of 1 for 365 days has been approved for 6 months from 07/14/2022-01/12/2023.  Dexcom G7 sensor Miscellaneous a quantity of 3 for 30 days has been approved for 6 months from 07/14/2022 to 01/12/2023

## 2022-07-15 NOTE — Telephone Encounter (Signed)
Notified [name] that his Continuous glucose monitor has been approved. He was advised to pick it up and schedule an appointment with me to learn how to use it.  Norm Parcel, RD 07/15/2022 9:07 AM.

## 2022-07-15 NOTE — Addendum Note (Signed)
Addended by: Bea Laura B on: 07/15/2022 04:19 PM   Modules accepted: Orders

## 2022-07-19 ENCOUNTER — Ambulatory Visit (INDEPENDENT_AMBULATORY_CARE_PROVIDER_SITE_OTHER): Payer: Medicaid Other | Admitting: Podiatry

## 2022-07-19 DIAGNOSIS — L97512 Non-pressure chronic ulcer of other part of right foot with fat layer exposed: Secondary | ICD-10-CM

## 2022-07-19 NOTE — Progress Notes (Signed)
  Subjective:  Patient ID: Andres Richardson, male    DOB: 1966-02-20,  MRN: [id number]  Chief Complaint  Patient presents with   Diabetic Ulcer    4 week follow up right foot - looks better    56 y.o. male presents with the above complaint. History confirmed with patient.   Objective:  Physical Exam: Weakly palpable pedal pulses foot is fairly warm and perfused well, the ulceration has healed with overlying hyperkeratosis     Assessment:   1. Ulcer of right foot with fat layer exposed (HCC)      Plan:  Patient was evaluated and treated and all questions answered.   Ulcer right foot -Doing well, ulcer has completely healed, advised to continue using a bandage for padding for the next month and can leave open to air, discontinue ointment and surgical shoe with peg assist at this time, he can resume this if it returns or worsens.  Dancers pads dispensed and advised to use these in his shoes at home for pressure offloading.  Advised on risk of recurrence in relation to his diabetes which is poorly controlled.  He will return to see me as needed  Return if symptoms worsen or fail to improve.

## 2022-07-27 ENCOUNTER — Ambulatory Visit (INDEPENDENT_AMBULATORY_CARE_PROVIDER_SITE_OTHER): Payer: Medicaid Other | Admitting: Gastroenterology

## 2022-07-27 VITALS — BP 126/64 | HR 70 | Ht 71.0 in | Wt 201.0 lb

## 2022-07-27 DIAGNOSIS — Z800 Family history of malignant neoplasm of digestive organs: Secondary | ICD-10-CM | POA: Diagnosis not present

## 2022-07-27 DIAGNOSIS — R1319 Other dysphagia: Secondary | ICD-10-CM | POA: Diagnosis not present

## 2022-07-27 DIAGNOSIS — Z1211 Encounter for screening for malignant neoplasm of colon: Secondary | ICD-10-CM

## 2022-07-27 DIAGNOSIS — K2100 Gastro-esophageal reflux disease with esophagitis, without bleeding: Secondary | ICD-10-CM

## 2022-07-27 DIAGNOSIS — I2510 Atherosclerotic heart disease of native coronary artery without angina pectoris: Secondary | ICD-10-CM

## 2022-07-27 DIAGNOSIS — K2101 Gastro-esophageal reflux disease with esophagitis, with bleeding: Secondary | ICD-10-CM

## 2022-07-27 DIAGNOSIS — Z933 Colostomy status: Secondary | ICD-10-CM

## 2022-07-27 MED ORDER — NA SULFATE-K SULFATE-MG SULF 17.5-3.13-1.6 GM/177ML PO SOLN: 1.0000 | Freq: Once | ORAL | 0 refills | Status: AC

## 2022-07-27 MED ORDER — PANTOPRAZOLE SODIUM 40 MG PO TBEC: 40.0000 mg | DELAYED_RELEASE_TABLET | Freq: Every day | ORAL | 3 refills | Status: DC

## 2022-07-27 NOTE — Progress Notes (Unsigned)
HPI : Andres Richardson is a very pleasant 56 year old male with an extensive medical history to include uncontrolled diabetes, coronary artery disease with complete occlusion of the RCA, peripheral artery disease, chronic tobacco use, obstructive sleep apnea, and a history of Fournier's gangrene requiring diverting colostomy for healing who is referred to our office by Dr. Teola Bradley for further consideration of repeat EGD to assess healing of esophageal ulcers and colonoscopy prior to ostomy takedown. Patient was originally seen by Dr. Therisa Doyne of [facility] when he was admitted in April for hematemesis.  An upper endoscopy at that time showed LA grade D esophagitis from 20 to 40 cm along with numerous superficial esophageal ulcers.  Many superficial duodenal ulcers and severe inflammation were seen in the duodenal bulb. Biopsies of the stomach were negative for H. pylori and biopsies of the esophagus showed acute inflammation with necrosis, negative for malignancy. He underwent a barium swallow May 19 which showed irregular segment narrowing of the distal esophagus, with continued ulceration and scarring.  He was seen by Dr. Deno Etienne in July of this year, and although they plan for a repeat upper endoscopy and a colonoscopy, his insurance is not accepted by [facility], so he transferred care to [facility].  Currently, the patient states that he has frequent GERD symptoms to include constant sour taste in his mouth, which is worse at night.  He takes Tums at night which helps a little bit.  He uses a wedge device between his mattress and box spring to elevate his head of bed.  He used to take Protonix, but he ran out of it several months ago.  His dysphagia comes and goes.  Currently he is able to tolerate regular food.  No episodes of having to forcefully vomit stuck food.  His weight is stable.  He has not had any recurrent hematemesis since the episode in April. He sometimes has abdominal pain related to  incisional hernias or if he overeats.  Otherwise he denies problems with abdominal pain  He underwent a diverting colostomy 2 years ago in the setting of Fournier's gangrene.  His reversal had been planned for last November, but was delayed because of his significant coronary artery disease and ongoing symptoms of angina and exertional dyspnea. He was seen by his cardiologist (Dr. Margaretann Loveless) September 18 for cardiology clearance prior to his colostomy reversal.  At that visit he was recommended to continue on aspirin, Imdur and beta-blocker and statin.  He was felt to be a reasonable risk for sedation for endoscopy, but it would not clear him for general anesthesia until they were able to evaluate him within 30 days of his planned surgery.  The patient denies chest pain to me currently.  He does admit to ongoing significant exertional dyspnea with minimal activity.  He thinks he can go up a flight of stairs, but would likely have to rest multiple times.  The patient passes formed stool through his ostomy daily.  No blood in stool.  He reports having a colonoscopy many years ago when he was in his 31s which she thinks was normal.  He has not had a colonoscopy since then. His father was diagnosed with colon cancer in his 56s.     EGD December 04, 2021 (Dr. Therisa Doyne) Indication: Hematemesis LA grade D esophagitis from 20 to 40 cm, many superficial esophageal ulcers without active bleeding Antral erythema Many superficial ulcers in the duodenal bulb with severe inflammation/erythema Gastric biopsies with chronic gastritis, no H.  pylori.  Esophageal ulcer biopsies with acute inflammation and necrosis, negative for malignancy  Barium swallow Jan 01, 2022 Irregular segmental narrowing of the distal esophagus, likely related to scarring..  Tablet showed no passage despite 3 to 4 minutes of observation Fold thickening elsewhere likely related to esophagitis with small volume reflux seen  Past Medical History:   Diagnosis Date   Diabetes mellitus without complication ([place])    Type !! 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 Diabetic neuropathy.   Grade I diastolic dysfunction [id number]   Hyperlipidemia    Mixed   Hypertension    Essential   Microalbuminuria    Obesity    Peripheral arterial disease ([place])    With Claudication   Tobacco use disorder      Past Surgical History:  Procedure Laterality Date   BIOPSY  12/04/2021   Procedure: BIOPSY;  Surgeon: Ronnette Juniper, MD;  Location: [facility];  Service: Gastroenterology;;   ESOPHAGOGASTRODUODENOSCOPY (EGD) WITH PROPOFOL N/A 12/04/2021   Procedure: ESOPHAGOGASTRODUODENOSCOPY (EGD) WITH PROPOFOL;  Surgeon: Ronnette Juniper, MD;  Location: [facility];  Service: Gastroenterology;  Laterality: N/A;   INCISION AND DRAINAGE ABSCESS N/A 02/24/2021   Procedure: INCISION AND DRAINAGE PERINEUM;  Surgeon: Kieth Brightly, Arta Bruce, MD;  Location: [facility];  Service: General;  Laterality: N/A;   INCISION AND DRAINAGE ABSCESS N/A 02/25/2021   Procedure: REPEAT WASHOUT OF PERINEUM;  Surgeon: Kieth Brightly Arta Bruce, MD;  Location: [facility];  Service: General;  Laterality: N/A;   LAPAROSCOPIC DIVERTED COLOSTOMY N/A 02/25/2021   Procedure: LAPAROSCOPIC DIVERTING SIGMOID COLOSTOMY;  Surgeon: Mickeal Skinner, MD;  Location: [facility];  Service: General;  Laterality: N/A;   LEFT HEART CATH AND CORONARY ANGIOGRAPHY N/A 05/18/2021   Procedure: LEFT HEART CATH AND CORONARY ANGIOGRAPHY;  Surgeon: Lorretta Harp, MD;  Location: [facility];  Service: Cardiovascular;  Laterality: N/A;   Family History  Problem Relation Age of Onset   CAD Mother    Colon cancer Father    Social History   Tobacco Use   Smoking status: Every Day    Packs/day: 1.00    Types: Cigarettes   Smokeless tobacco: Never   Tobacco comments:    Uses Nicorette Gum.   Vaping Use   Vaping Use: Never used  Substance Use Topics   Alcohol use: Never   Drug use: Never   Current  Outpatient Medications  Medication Sig Dispense Refill   acetaminophen (TYLENOL) 500 MG tablet Take 1,000-1,500 mg by mouth 2 (two) times daily as needed for moderate pain.     aspirin EC 81 MG tablet Take 1 tablet (81 mg total) by mouth daily. Swallow whole. 30 tablet 11   atorvastatin (LIPITOR) 40 MG tablet Take 1 tablet (40 mg total) by mouth daily. 90 tablet 1   calcium carbonate (TUMS - DOSED IN MG ELEMENTAL CALCIUM) 500 MG chewable tablet Chew 1 tablet by mouth at bedtime.     Continuous Blood Gluc Sensor (DEXCOM G7 SENSOR) MISC 1 Units by Does not apply route once a week. 3 each 3   empagliflozin (JARDIANCE) 10 MG TABS tablet Take 10 mg by mouth daily in the afternoon.     furosemide (LASIX) 40 MG tablet Take 1 tablet (40 mg total) by mouth daily. 90 tablet 1   gentamicin ointment (GARAMYCIN) 0.1 % Apply 1 Application topically daily. 30 g 2   Homeopathic Products (LEG CRAMPS) TABS Take 3 tablets by mouth daily as needed (leg cramps).     insulin NPH-regular Human (  70-30) 100 UNIT/ML injection Inject 20-30 Units into the skin See admin instructions. Inject 20 units in AM and 30 units in PM     Iron, Ferrous Sulfate, 325 (65 Fe) MG TABS Take 1 tablet by mouth daily.     isosorbide mononitrate (IMDUR) 30 MG 24 hr tablet Take 3 tablets (90 mg total) by mouth daily. 270 tablet 3   metFORMIN (GLUCOPHAGE-XR) 750 MG 24 hr tablet Take 1 tablet (750 mg total) by mouth 2 (two) times daily. 90 tablet 1   metoprolol succinate (TOPROL-XL) 25 MG 24 hr tablet Take 1 tablet (25 mg total) by mouth daily. 90 tablet 1   montelukast (SINGULAIR) 10 MG tablet Take 10 mg by mouth daily.     Multiple Vitamin (MULTIVITAMIN WITH MINERALS) TABS tablet Take 1 tablet by mouth daily. 90 tablet 1   nitroGLYCERIN (NITROSTAT) 0.4 MG SL tablet Place 0.4 mg under the tongue every 5 (five) minutes as needed for chest pain.     pantoprazole (PROTONIX) 40 MG tablet Take 1 tablet (40 mg total) by mouth 2 (two) times daily before  a meal for 60 days, THEN 1 tablet (40 mg total) daily. 90 tablet 1   potassium chloride SA (KLOR-CON M) 20 MEQ tablet Take 1 tablet (20 mEq total) by mouth 2 (two) times daily. 6 tablet 0   sucralfate (CARAFATE) 1 GM/10ML suspension Take 10 mLs (1 g total) by mouth 2 (two) times daily. 420 mL 0   No current facility-administered medications for this visit.   Allergies  Allergen Reactions   Adhesive [Tape] Rash     Review of Systems: All systems reviewed and negative except where noted in HPI.    No results found.  Physical Exam: BP 126/64   Pulse 70   Ht 5\' 11"  (1.803 m)   Wt 201 lb (91.2 kg)   BMI 28.03 kg/m  Constitutional: Pleasant,well-developed, Caucasian male in no acute distress. HEENT: Normocephalic and atraumatic. Conjunctivae are normal. No scleral icterus. Neck supple.  Cardiovascular: Normal rate, regular rhythm.  Pulmonary/chest: Effort normal and breath sounds normal. No wheezing, rales or rhonchi. Abdominal: Soft, nondistended, nontender. Bowel sounds active throughout. There are no masses palpable. No hepatomegaly.  Ostomy in left hemiabdomen with formed solid stool in bag.  Large, soft nontender parastomal hernia and surgical scars present Extremities: no edema Neurological: Alert and oriented to person place and time. Skin: Skin is warm and dry/flaky.  Psychiatric: Normal mood and affect. Behavior is normal.  CBC    Component Value Date/Time   WBC 6.9 04/15/2022 2039   RBC 4.99 04/15/2022 2039   HGB 14.1 04/15/2022 2039   HGB 11.4 (L) 06/17/2021 1644   HCT 41.9 04/15/2022 2039   HCT 35.8 (L) 06/17/2021 1644   PLT 215 04/15/2022 2039   PLT 194 06/17/2021 1644   MCV 84.0 04/15/2022 2039   MCV 84 06/17/2021 1644   MCH 28.3 04/15/2022 2039   MCHC 33.7 04/15/2022 2039   RDW 15.2 04/15/2022 2039   RDW 15.1 06/17/2021 1644   LYMPHSABS 1.4 12/24/2021 1434   MONOABS 0.5 12/24/2021 1434   EOSABS 0.2 12/24/2021 1434   BASOSABS 0.0 12/24/2021 1434     CMP     Component Value Date/Time   NA 133 (L) 04/15/2022 2039   NA 137 06/17/2021 1659   K 4.3 04/15/2022 2039   CL 99 04/15/2022 2039   CO2 24 04/15/2022 2039   GLUCOSE 497 (H) 04/15/2022 2039   BUN 21 (H) 04/15/2022  2039   BUN 23 06/17/2021 1659   CREATININE 1.36 (H) 04/15/2022 2039   CALCIUM 9.4 04/15/2022 2039   PROT 7.2 04/15/2022 2039   ALBUMIN 3.6 04/15/2022 2039   AST 8 (L) 04/15/2022 2039   ALT 7 04/15/2022 2039   ALKPHOS 89 04/15/2022 2039   BILITOT 0.3 04/15/2022 2039   GFRNONAA >60 04/15/2022 2039   GFRAA >60 05/14/2019 0317     ASSESSMENT AND PLAN: 56 year old male with extensive medical history as outlined above, who needs colonoscopy prior to ostomy takedown, as well as repeat upper endoscopy to assess healing of esophageal ulcers and exclude esophageal dysplasia.  Also, may provide esophageal dilation if focal stricture present.  The patient is very high risk from a perioperative standpoint, but Cardiology felt sedation for a endoscopic procedure was reasonable.  We will schedule him for an EGD and colonoscopy via ostomy in the hospital setting. In the meantime, the patient should resume PPI indefinitely.  Will restart Protonix 40 mg once daily.  GERD w/ esophagitis, dysphagia - EGD with possible dilation - Resume Protonix daily indefinitely  Colon cancer screening/clearance prior to ostomy take down, family history of colon cancer - Colonoscopy via ostomy with pouchoscopy prior to ostomy takedown  EGD and colonoscopy will be performed at [facility] endoscopy center  The details, risks (including bleeding, perforation, infection, missed lesions, medication reactions and possible hospitalization or surgery if complications occur), benefits, and alternatives to EGD and colonoscopy with possible biopsy, dilation and polypectomy were discussed with the patient and he consents to proceed.   Cowan Pilar E. Candis Schatz, MD [facility]   CC:  Teola Bradley, MD

## 2022-07-27 NOTE — Patient Instructions (Signed)
_______________________________________________________  If you are age 56 or older, your body mass index should be between 23-30. Your Body mass index is 28.03 kg/m. If this is out of the aforementioned range listed, please consider follow up with your Primary Care Provider.  If you are age 44 or younger, your body mass index should be between 19-25. Your Body mass index is 28.03 kg/m. If this is out of the aformentioned range listed, please consider follow up with your Primary Care Provider.   You have been scheduled for a colonoscopy. Please follow written instructions given to you at your visit today.  Please pick up your prep supplies at the pharmacy within the next 1-3 days. If you use inhalers (even only as needed), please bring them with you on the day of your procedure.   We have sent the following medications to your pharmacy for you to pick up at your convenience: Protonix 40 mg by mouth daily.  The [facility] providers would like to encourage you to use [facility] to communicate with providers for non-urgent requests or questions.  Due to long hold times on the telephone, sending your provider a message by [facility] may be a faster and more efficient way to get a response.  Please allow 48 business hours for a response.  Please remember that this is for non-urgent requests.   It was a pleasure to see you today!  Thank you for trusting me with your gastrointestinal care!    _

## 2022-07-28 ENCOUNTER — Telehealth: Payer: Self-pay | Admitting: Dietician

## 2022-07-28 ENCOUNTER — Encounter: Payer: Medicaid Other | Admitting: Dietician

## 2022-07-28 NOTE — Telephone Encounter (Signed)
Does not have CGM yet,canceling his appointment. He will call to reschedule.

## 2022-07-29 ENCOUNTER — Other Ambulatory Visit: Payer: Self-pay

## 2022-07-29 ENCOUNTER — Encounter: Payer: Self-pay | Admitting: Student

## 2022-07-29 ENCOUNTER — Telehealth: Payer: Self-pay | Admitting: Dietician

## 2022-07-29 ENCOUNTER — Ambulatory Visit: Payer: Medicaid Other | Admitting: Student

## 2022-07-29 VITALS — BP 109/69 | HR 92 | Temp 98.1°F | Resp 24 | Ht 71.0 in | Wt 203.4 lb

## 2022-07-29 DIAGNOSIS — S91301A Unspecified open wound, right foot, initial encounter: Secondary | ICD-10-CM | POA: Diagnosis not present

## 2022-07-29 DIAGNOSIS — Z7984 Long term (current) use of oral hypoglycemic drugs: Secondary | ICD-10-CM | POA: Diagnosis not present

## 2022-07-29 DIAGNOSIS — E1165 Type 2 diabetes mellitus with hyperglycemia: Secondary | ICD-10-CM

## 2022-07-29 DIAGNOSIS — Z794 Long term (current) use of insulin: Secondary | ICD-10-CM

## 2022-07-29 DIAGNOSIS — D509 Iron deficiency anemia, unspecified: Secondary | ICD-10-CM | POA: Diagnosis present

## 2022-07-29 MED ORDER — PEN NEEDLES 32G X 4 MM MISC: 1.0000 [IU] | Freq: Four times a day (QID) | 3 refills | Status: DC

## 2022-07-29 MED ORDER — INSULIN GLARGINE 100 UNIT/ML SOLOSTAR PEN: 25.0000 [IU] | PEN_INJECTOR | Freq: Every day | SUBCUTANEOUS | 11 refills | Status: DC

## 2022-07-29 MED ORDER — INSULIN LISPRO (1 UNIT DIAL) 100 UNIT/ML (KWIKPEN): 8.0000 [IU] | PEN_INJECTOR | Freq: Three times a day (TID) | SUBCUTANEOUS | 4 refills | Status: DC

## 2022-07-29 NOTE — Telephone Encounter (Signed)
Mr. Urton states he got the [facility] receiver , but no sensors. Call to [facility] as both were approved:sensors are on order. Patient informed.

## 2022-07-29 NOTE — Progress Notes (Signed)
la 

## 2022-07-29 NOTE — Patient Instructions (Addendum)
Please start lantus 25 units daily and humalog 8 units three times daily before meals   Follow up with ophthalmology  [facility] [place], [place] Loc[place] in: [facility] Address: [address] [place], [place], [place] [address] Phone: [phone]

## 2022-07-30 ENCOUNTER — Other Ambulatory Visit (HOSPITAL_COMMUNITY): Payer: Self-pay

## 2022-07-30 DIAGNOSIS — D509 Iron deficiency anemia, unspecified: Secondary | ICD-10-CM | POA: Insufficient documentation

## 2022-07-30 LAB — MICROALBUMIN / CREATININE URINE RATIO
Creatinine, Urine: 26.2 mg/dL
Microalb/Creat Ratio: 2589 mg/g creat — ABNORMAL HIGH (ref 0–29)
Microalbumin, Urine: 678.2 ug/mL

## 2022-07-30 NOTE — Assessment & Plan Note (Signed)
Patient was able to obtain dexcom receiver. Needs to pick up sensors. Feeling well since last visit. Concerned his blood sugar is high since stopping ozempic and jardiance. Difficult to adjust his medicaitons given no glucose data. We discussed his current insulin with 70-30 NPH. States he and prior PCP has discussed this and decided on current regimen due to cost. I think patient would benefit to switching to basal bolus insulin for better glycemic control. This will also be more suited to titration when he has CGM. Patient open to trying this if affordable.   -Start lantus 25 units daily -Lispro 8 units three times daily with meals. -continue metformin -urine microalbumin/cr -patient will schedule visit with diabetes educator for CGM placement once he is able to pick this up -contact information given for opthalmology

## 2022-07-30 NOTE — Assessment & Plan Note (Signed)
Reports history of iron deficiency on iron supplementation. Denies signs of blood loss. Last hgb 14 in August. Will check iron studies. Has colonoscopy and EGD scheduled for 08/30/2021.

## 2022-07-30 NOTE — Assessment & Plan Note (Signed)
Ulceration appears well healed. He will check feet daily and follow up as needed.

## 2022-07-30 NOTE — Progress Notes (Signed)
Established Patient Office Visit  Subjective   Patient ID: Andres Richardson, male    DOB: [birth year]  Age: 56 y.o. MRN: [id number]  Chief Complaint  Patient presents with   Follow-up   Medication Refill    Andres Richardson is a 56 y.o. person living with a history listed below who presents to clinic for diabetes follow up. Please refer to problem based charting for further details and assessment and plan of current problem and chronic medical conditions.     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   Grade I diastolic dysfunction 12/02/2021   Hyperbilirubinemia 12/02/2021   AKI (acute kidney injury) (HCC) 12/02/2021   Hematemesis 12/02/2021   Acute respiratory failure with hypoxia (HCC) 05/15/2021   Hyperlipidemia 05/15/2021   CAD (coronary artery disease) 05/15/2021   Acute diastolic CHF (congestive heart failure) (HCC) 05/15/2021   Acute on chronic respiratory failure with hypoxia and hypercapnia (HCC)    Sepsis (HCC) 05/10/2021   Acute respiratory failure with hypoxia and hypercapnia (HCC) 05/10/2021   Fournier's gangrene 02/23/2021   DKA (diabetic ketoacidoses) 05/11/2019      ROS: negative as per HPI     Objective:     BP 109/69 (BP Location: Right Arm, Patient Position: Sitting, Cuff Size: Normal)   Pulse 92   Temp 98.1 F (36.7 C) (Oral)   Resp (!) 24   Ht 5\' 11"  (1.803 m)   Wt 203 lb 6.4 oz (92.3 kg)   SpO2 100% Comment: room air  BMI 28.37 kg/m  BP Readings from Last 3 Encounters:  07/29/22 109/69  07/27/22 126/64  07/05/22 (!) 142/73      Physical Exam Constitutional:      Comments:  Chronically ill appearing  HENT:     Mouth/Throat:     Mouth: Mucous membranes are moist.     Pharynx: Oropharynx is clear.  Cardiovascular:     Rate and Rhythm: Normal rate and regular rhythm.     Pulses: Normal pulses.  Pulmonary:     Effort: Pulmonary effort is normal.     Breath sounds: No rhonchi or rales.  Abdominal:     General: Abdomen is flat. Bowel sounds are normal. There is no distension.     Palpations: Abdomen is soft.     Tenderness: There is no abdominal tenderness.     Comments: Colostomy bag in place  Musculoskeletal:        General: Normal range of motion.     Right lower leg: No edema.     Left lower leg: No edema.  Skin:    General: Skin is warm and dry.     Capillary Refill: Capillary refill takes less than 2 seconds.     Comments: Callus of the right base of the MCP with healed ulcer. No erythema, warmth or drainage   Neurological:     General: No focal deficit present.     Mental Status: He is alert and oriented to person, place, and time.  Psychiatric:        Mood and Affect: Mood normal.        Behavior: Behavior normal.  No results found for any visits on 07/29/22.  Last CBC Lab Results  Component Value Date   WBC 6.9 04/15/2022   HGB 14.1 04/15/2022   HCT 41.9 04/15/2022   MCV 84.0 04/15/2022   MCH 28.3 04/15/2022   RDW 15.2 04/15/2022   PLT 215 04/15/2022   Last hemoglobin A1c Lab Results  Component Value Date   HGBA1C 12.9 (A) 06/02/2022      The ASCVD Risk score (Arnett DK, et al., 2019) failed to calculate for the following reasons:   The valid total cholesterol range is 130 to 320 mg/dL    Assessment & Plan:   Problem List Items Addressed This Visit       Endocrine   Uncontrolled type 2 diabetes mellitus with hyperglycemia, with long-term current use of insulin (HCC)    Patient was able to obtain dexcom receiver. Needs to pick up sensors. Feeling well since last visit. Concerned his blood sugar is high since  stopping ozempic and jardiance. Difficult to adjust his medicaitons given no glucose data. We discussed his current insulin with 70-30 NPH. States he and prior PCP has discussed this and decided on current regimen due to cost. I think patient would benefit to switching to basal bolus insulin for better glycemic control. This will also be more suited to titration when he has CGM. Patient open to trying this if affordable.   -Start lantus 25 units daily -Lispro 8 units three times daily with meals. -continue metformin -urine microalbumin/cr -patient will schedule visit with diabetes educator for CGM placement once he is able to pick this up -contact information given for opthalmology       Relevant Medications   insulin glargine (LANTUS) 100 UNIT/ML Solostar Pen   insulin lispro (HUMALOG KWIKPEN) 100 UNIT/ML KwikPen   Other Relevant Orders   Microalbumin / Creatinine Urine Ratio     Other   Wound of right foot    Ulceration appears well healed. He will check feet daily and follow up as needed.      Iron deficiency anemia - Primary    Reports history of iron deficiency on iron supplementation. Denies signs of blood loss. Last hgb 14 in August. Will check iron studies. Has colonoscopy and EGD scheduled for 08/30/2021.      Relevant Orders   Iron, TIBC and Ferritin Panel    Return in about 4 weeks (around 08/26/2022).    Quincy Simmonds, MD

## 2022-07-30 NOTE — Progress Notes (Signed)
[facility] Attending ? ?Case discussed with Dr. Liang  At the time of the visit.  We reviewed the resident?s history and exam and pertinent patient test results.  I agree with the assessment, diagnosis, and plan of care documented in the resident?s note. ? ?

## 2022-07-31 LAB — IRON,TIBC AND FERRITIN PANEL
Ferritin: 243 ng/mL (ref 30–400)
Iron Saturation: 18 % (ref 15–55)
Iron: 49 ug/dL (ref 38–169)
Total Iron Binding Capacity: 275 ug/dL (ref 250–450)
UIBC: 226 ug/dL (ref 111–343)

## 2022-08-06 ENCOUNTER — Encounter: Payer: Self-pay | Admitting: Student

## 2022-08-23 ENCOUNTER — Encounter (HOSPITAL_COMMUNITY): Payer: Self-pay | Admitting: Gastroenterology

## 2022-08-23 NOTE — Progress Notes (Signed)
Attempted to obtain medical history via telephone, unable to reach at this time. HIPAA compliant voicemail message left requesting return call to pre surgical testing department. 

## 2022-08-24 ENCOUNTER — Telehealth: Payer: Self-pay | Admitting: Dietician

## 2022-08-24 NOTE — Telephone Encounter (Signed)
Appointment scheduled for personal Continuous glucose monitoring training.

## 2022-08-25 ENCOUNTER — Ambulatory Visit: Payer: Medicaid Other | Admitting: Podiatry

## 2022-08-25 ENCOUNTER — Ambulatory Visit: Payer: Medicaid Other | Admitting: Dietician

## 2022-08-25 DIAGNOSIS — Z794 Long term (current) use of insulin: Secondary | ICD-10-CM

## 2022-08-25 DIAGNOSIS — E1165 Type 2 diabetes mellitus with hyperglycemia: Secondary | ICD-10-CM

## 2022-08-25 DIAGNOSIS — L97512 Non-pressure chronic ulcer of other part of right foot with fat layer exposed: Secondary | ICD-10-CM

## 2022-08-25 NOTE — Patient Instructions (Addendum)
Dr. Zenia Resides Office [phone]  Have fun with the Continuous glucose monitoring.   I suggest you put the [facility] tech support in your phone.   They usually replace a sensor that doesn't last a full 10 days.   Good luck with your procedures.    See you on the 24th.  [name] [phone]

## 2022-08-30 ENCOUNTER — Encounter (HOSPITAL_COMMUNITY): Payer: Self-pay | Admitting: Anesthesiology

## 2022-08-30 ENCOUNTER — Ambulatory Visit (HOSPITAL_COMMUNITY)
Admission: RE | Admit: 2022-08-30 | Discharge: 2022-08-30 | Disposition: A | Discharge status: Home / Self Care | Payer: Medicaid Other | Attending: Gastroenterology | Admitting: Gastroenterology

## 2022-08-30 ENCOUNTER — Encounter (HOSPITAL_COMMUNITY): Payer: Self-pay | Admitting: Gastroenterology

## 2022-08-30 ENCOUNTER — Encounter (HOSPITAL_COMMUNITY)
Admission: RE | Disposition: A | Discharge status: Home / Self Care | Payer: Self-pay | Source: Home / Self Care | Attending: Gastroenterology

## 2022-08-30 DIAGNOSIS — Z794 Long term (current) use of insulin: Secondary | ICD-10-CM | POA: Diagnosis not present

## 2022-08-30 DIAGNOSIS — F17210 Nicotine dependence, cigarettes, uncomplicated: Secondary | ICD-10-CM | POA: Diagnosis not present

## 2022-08-30 DIAGNOSIS — E1151 Type 2 diabetes mellitus with diabetic peripheral angiopathy without gangrene: Secondary | ICD-10-CM | POA: Diagnosis not present

## 2022-08-30 DIAGNOSIS — Z800 Family history of malignant neoplasm of digestive organs: Secondary | ICD-10-CM | POA: Diagnosis not present

## 2022-08-30 DIAGNOSIS — I2510 Atherosclerotic heart disease of native coronary artery without angina pectoris: Secondary | ICD-10-CM | POA: Diagnosis not present

## 2022-08-30 DIAGNOSIS — G4733 Obstructive sleep apnea (adult) (pediatric): Secondary | ICD-10-CM | POA: Insufficient documentation

## 2022-08-30 DIAGNOSIS — Z538 Procedure and treatment not carried out for other reasons: Secondary | ICD-10-CM | POA: Insufficient documentation

## 2022-08-30 DIAGNOSIS — E1140 Type 2 diabetes mellitus with diabetic neuropathy, unspecified: Secondary | ICD-10-CM | POA: Diagnosis not present

## 2022-08-30 DIAGNOSIS — Z8249 Family history of ischemic heart disease and other diseases of the circulatory system: Secondary | ICD-10-CM | POA: Insufficient documentation

## 2022-08-30 DIAGNOSIS — Z7984 Long term (current) use of oral hypoglycemic drugs: Secondary | ICD-10-CM | POA: Diagnosis not present

## 2022-08-30 DIAGNOSIS — Z5986 Financial insecurity: Secondary | ICD-10-CM | POA: Insufficient documentation

## 2022-08-30 DIAGNOSIS — K2101 Gastro-esophageal reflux disease with esophagitis, with bleeding: Secondary | ICD-10-CM

## 2022-08-30 DIAGNOSIS — Z01818 Encounter for other preprocedural examination: Secondary | ICD-10-CM | POA: Insufficient documentation

## 2022-08-30 LAB — GLUCOSE, CAPILLARY
Glucose-Capillary: 436 mg/dL — ABNORMAL HIGH (ref 70–99)
Glucose-Capillary: 454 mg/dL — ABNORMAL HIGH (ref 70–99)

## 2022-08-30 SURGERY — CANCELLED PROCEDURE

## 2022-08-30 MED ORDER — LACTATED RINGERS IV SOLN: INTRAVENOUS | Status: DC

## 2022-08-30 MED ORDER — SODIUM CHLORIDE 0.9 % IV SOLN: INTRAVENOUS | Status: DC

## 2022-08-30 SURGICAL SUPPLY — 24 items
BLOCK BITE 60FR ADLT L/F BLUE (MISCELLANEOUS) ×2 IMPLANT
ELECT REM PT RETURN 9FT ADLT (ELECTROSURGICAL)
ELECTRODE REM PT RTRN 9FT ADLT (ELECTROSURGICAL) IMPLANT
FCP BXJMBJMB 240X2.8X (CUTTING FORCEPS)
FLOOR PAD 36X40 (MISCELLANEOUS) ×2
FORCEP RJ3 GP 1.8X160 W-NEEDLE (CUTTING FORCEPS) IMPLANT
FORCEPS BIOP RAD 4 LRG CAP 4 (CUTTING FORCEPS) IMPLANT
FORCEPS BIOP RJ4 240 W/NDL (CUTTING FORCEPS)
FORCEPS BXJMBJMB 240X2.8X (CUTTING FORCEPS) IMPLANT
INJECTOR/SNARE I SNARE (MISCELLANEOUS) IMPLANT
LUBRICANT JELLY 4.5OZ STERILE (MISCELLANEOUS) IMPLANT
MANIFOLD NEPTUNE II (INSTRUMENTS) IMPLANT
NEEDLE SCLEROTHERAPY 25GX240 (NEEDLE) IMPLANT
PAD FLOOR 36X40 (MISCELLANEOUS) ×2 IMPLANT
PROBE APC STR FIRE (PROBE) IMPLANT
PROBE INJECTION GOLD (MISCELLANEOUS)
PROBE INJECTION GOLD 7FR (MISCELLANEOUS) IMPLANT
SNARE ROTATE MED OVAL 20MM (MISCELLANEOUS) IMPLANT
SNARE SHORT THROW 13M SML OVAL (MISCELLANEOUS) IMPLANT
SYR 50ML LL SCALE MARK (SYRINGE) IMPLANT
TRAP SPECIMEN MUCOUS 40CC (MISCELLANEOUS) IMPLANT
TUBING ENDO SMARTCAP PENTAX (MISCELLANEOUS) ×4 IMPLANT
TUBING IRRIGATION ENDOGATOR (MISCELLANEOUS) ×2 IMPLANT
WATER STERILE IRR 1000ML POUR (IV SOLUTION) IMPLANT

## 2022-08-30 NOTE — Progress Notes (Signed)
  Subjective:  Patient ID: Andres Richardson, male    DOB: 28-Apr-1966,  MRN: [id number]  Chief Complaint  Patient presents with   Diabetic Ulcer    open wound on side of foot due to injury, diabetic    57 y.o. male presents with the above complaint. History confirmed with patient.  He said it is doing okay he has been using the gentamicin ointment  Objective:  Physical Exam: Weakly palpable pedal pulses foot is fairly warm and perfused well, the ulceration has returned deep to the hyperkeratotic tissue measuring 0.6 x 0.7 x 0.3 cm.  No purulence drainage or evidence of infection      Assessment:   1. Ulcer of right foot with fat layer exposed ([place])      Plan:  Patient was evaluated and treated and all questions answered.   Ulcer right foot -Ulcer has recurred once more.  Unclear if it was ever fully healed or covered with overlying hyperkeratosis.  I debrided the wound of nonviable tissue as noted above with a sharp scalpel in an excisional manner.  Postdebridement drains are noted he should continue using the surgical shoe with peg assist device.  Continue gentamicin ointment daily at home.  I will see him back in 3 weeks for follow-up wound care  Return in about 3 weeks (around 09/15/2022) for wound care.

## 2022-08-30 NOTE — Progress Notes (Signed)
Patient's blood sugar was 436 upon arrival. Patient stated he hasn't taken his insulin since yesterday. MD and anesthesiologist both agree to postpone procedure until blood sugar is under better control. Dr. Candis Schatz has instructed patient to contact his PCP.

## 2022-08-30 NOTE — Anesthesia Preprocedure Evaluation (Deleted)
Anesthesia Evaluation  Patient identified by MRN, date of birth, ID band Patient awake    Reviewed: Allergy & Precautions, NPO status , Patient's Chart, lab work & pertinent test results  Airway Mallampati: II  TM Distance: >3 FB Neck ROM: Full    Dental  (+) Teeth Intact, Dental Advisory Given   Pulmonary Current Smoker   Pulmonary exam normal breath sounds clear to auscultation       Cardiovascular hypertension, Pt. on home beta blockers + CAD, + Peripheral Vascular Disease and +CHF  Normal cardiovascular exam Rhythm:Regular Rate:Normal     Neuro/Psych negative neurological ROS  negative psych ROS   GI/Hepatic Neg liver ROS,GERD  Medicated,,  Endo/Other  diabetes, Type 2, Insulin Dependent, Oral Hypoglycemic Agents    Renal/GU negative Renal ROS     Musculoskeletal negative musculoskeletal ROS (+)    Abdominal   Peds  Hematology negative hematology ROS (+)   Anesthesia Other Findings Day of surgery medications reviewed with the patient.  Reproductive/Obstetrics                             Anesthesia Physical Anesthesia Plan  ASA: 3  Anesthesia Plan: MAC   Post-op Pain Management: Minimal or no pain anticipated   Induction: Intravenous  PONV Risk Score and Plan: 1 and TIVA and Treatment may vary due to age or medical condition  Airway Management Planned: Natural Airway and Simple Face Mask  Additional Equipment:   Intra-op Plan:   Post-operative Plan:   Informed Consent: I have reviewed the patients History and Physical, chart, labs and discussed the procedure including the risks, benefits and alternatives for the proposed anesthesia with the patient or authorized representative who has indicated his/her understanding and acceptance.     Dental advisory given  Plan Discussed with: CRNA and Anesthesiologist  Anesthesia Plan Comments:        Anesthesia Quick  Evaluation

## 2022-08-30 NOTE — Progress Notes (Signed)
Diabetes Self-Management Education  Visit Type: First/Initial  Appt. Start Time: 1028 Appt. End Time: 1884  08/30/2022  Andres Richardson, identified by name and date of birth, is a 57 y.o. male with a diagnosis of Diabetes: Type 2.   ASSESSMENT  Patient is here for new personal Continuous glucose monitoring training. He brought a reader and sensor.  Dexcom G7 Personal CGM Training SALAH NAKAMURA was educated about the following:  -Getting to know device    Software engineer ) -Setting up device (high alert  off  , low alert 70  ) -Rise alert set at  not set  mg/dL  -Fall alert set at    not set  mg/dL  -Setting alert profile -Inserting sensor ( He did this without p;roblem) -Calibrating- none required for G6 -Ending sensor session -Trouble shooting -Clarity information   -Need to Reviewed insulin dosing from Dexcom.   Patient has [facility] tech support and my contact information.    Diabetes Self-Management Education - 08/25/22 1100       Visit Information   Visit Type First/Initial      Initial Visit   Diabetes Type Type 2    Date Diagnosed 1995    Are you currently following a meal plan? --   unknown, not a big sweet eater, trying to be sure he gets his nutritonal needs met.   Are you taking your medications as prescribed? No   still taking 70/30 insulin     Health Coping   How would you rate your overall health? --   need to assess at future visit     Psychosocial Assessment   Patient Belief/Attitude about Diabetes Motivated to manage diabetes    What is the hardest part about your diabetes right now, causing you the most concern, or is the most worrisome to you about your diabetes?   Checking blood sugar    Self-care barriers Lack of material resources;Lack of transportation    Self-management support Friends;Doctor's office;CDE visits    Other persons present Friend    Patient Concerns Monitoring    Special Needs None    Preferred Learning Style No  preference indicated    Learning Readiness Ready    How often do you need to have someone help you when you read instructions, pamphlets, or other written materials from your doctor or pharmacy? 1 - Never    What is the last grade level you completed in school? unknown      Pre-Education Assessment   Patient understands the diabetes disease and treatment process. --   need to assess at future visit   Patient understands incorporating nutritional management into lifestyle. --   need to assess at future visit   Patient undertands incorporating physical activity into lifestyle. --   need to assess at future visit   Patient understands using medications safely. --   need to assess at future visit   Patient understands monitoring blood glucose, interpreting and using results Needs Instruction    Patient understands prevention, detection, and treatment of acute complications. Needs Review    Patient understands prevention, detection, and treatment of chronic complications. Needs Review    Patient understands how to develop strategies to address psychosocial issues. Demonstrates understanding / competency    Patient understands how to develop strategies to promote health/change behavior. Demonstrates understanding / competency      Complications   Last HgB A1C per patient/outside source 12.9 %    How often do you check your  blood sugar? 3-4 times / week    Fasting Blood glucose range (mg/dL) >200   358 and rising after black coffee this am, he stated he felt fine   Postprandial Blood glucose range (mg/dL) >200    Number of hypoglycemic episodes per month 0    Number of hyperglycemic episodes ( >200mg /dL): Daily    Can you tell when your blood sugar is high? No   seems to be sued to blood sugar in the 300s   Have you had a dilated eye exam in the past 12 months? No    Have you had a dental exam in the past 12 months? --   not assessed today   Are you checking your feet? --   not assessed today      Dietary Intake   Breakfast not assessed today      Activity / Exercise   Activity / Exercise Type --   not assessed today     Patient Education   Previous Diabetes Education --   unsure if he has had education in the past   Monitoring Taught/evaluated CGM (comment)      Individualized Goals (developed by patient)   Monitoring  Consistenly use CGM      Outcomes   Expected Outcomes Demonstrated interest in learning. Expect positive outcomes    Future DMSE 2 wks    Program Status Not Completed             Individualized Plan for Diabetes Self-Management Training:   Learning Objective:  Patient will have a greater understanding of diabetes self-management. Patient education plan is to attend individual and/or group sessions per assessed needs and concerns.   Plan:   Patient Instructions  Dr. Zenia Resides Office [phone]  Have fun with the Continuous glucose monitoring.   I suggest you put the [facility] tech support in your phone.   They usually replace a sensor that doesn't last a full 10 days.   Good luck with your procedures.    See you on the 24th.  [name] [phone]        Expected Outcomes:  Demonstrated interest in learning. Expect positive outcomes  Education material provided: Diabetes Resources  If problems or questions, patient to contact team via:  Phone  Future DSME appointment: 2 wks Debera Lat, RD 08/30/2022 10:28 AM.

## 2022-08-30 NOTE — H&P (Signed)
[facility] History and Physical   Primary Care Physician:  Delene Ruffini, MD   Reason for Procedure:   Evaluation prior to ostomy reversal, family history of colon cancer, follow up reflux esophagitis  Plan:    EGD, colonoscopy  PROCEDURES CANCELLED DUE TO HYPERGLYCEMIA  In preadmission, the patient's blood sugar was noted to be in the 400s.  Because of this and the elective nature of the procedures, anesthesiology recommended cancelling the procedures.  The patient did not take his long acting insulin last night.  Last time he took his short acting insulin was yesterday afternoon.  His diabetes is very poorly controlled (A1c 12.9)and he was recently restarted on long acting insulin.  He reports difficulty with his glucometer at home.  His blood sugar was in the 400s at an office visit in October as well.   He feels well, other than being hungry and thirsty.  He denies nausea/vomiting, somnolence, light-headedness/dizziness.  His PCP was messaged and recommended he take his long acting insulin now and then monitor his blood sugar at home this afternoon.  They will arrange close outpatient follow up.  We will plan to reschedule his EGD and colonoscopy at another time.   ----------------------------------------------------------------------------------------------------  HPI: Andres Richardson is a 57 y.o. male with uncontrolled diabetes, coronary artery disease with complete occlusion of the RCA, peripheral artery disease, chronic tobacco use, obstructive sleep apnea, and a history of Fournier's gangrene requiring diverting colostomy , who needs colonoscopy prior to ostomy takedown, as well as repeat upper endoscopy to assess healing of esophageal ulcers and exclude esophageal dysplasia. undergoing high risk screening colonoscopy.  His father was diagnosed with colon cancer in his 25s.  Patient was originally seen by Dr. Therisa Doyne of [facility] when he was admitted in April for  hematemesis.  An upper endoscopy at that time showed LA grade D esophagitis from 20 to 40 cm along with numerous superficial esophageal ulcers.  Many superficial duodenal ulcers and severe inflammation were seen in the duodenal bulb. Biopsies of the stomach were negative for H. pylori and biopsies of the esophagus showed acute inflammation with necrosis, negative for malignancy  He underwent a diverting colostomy 2 years ago in the setting of Fournier's gangrene.  His reversal had been planned for last November, but was delayed because of his significant coronary artery disease and ongoing symptoms of angina and exertional dyspnea. He was seen by his cardiologist (Dr. Margaretann Loveless) September 18 for cardiology clearance prior to his colostomy reversal.  At that visit he was recommended to continue on aspirin, Imdur and beta-blocker and statin.  He was felt to be a reasonable risk for sedation for endoscopy, but it would not clear him for general anesthesia until they were able to evaluate him within 30 days of his planned surgery.  Past Medical History:  Diagnosis Date   Diabetes mellitus without complication ([place])    Type !! 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 Diabetic neuropathy.   Grade I diastolic dysfunction 7/85/8850   Hyperlipidemia    Mixed   Hypertension    Essential   Microalbuminuria    Obesity    Peripheral arterial disease ([place])    With Claudication   Tobacco use disorder     Past Surgical History:  Procedure Laterality Date   BIOPSY  12/04/2021   Procedure: BIOPSY;  Surgeon: Ronnette Juniper, MD;  Location: [facility];  Service: Gastroenterology;;   ESOPHAGOGASTRODUODENOSCOPY (EGD) WITH PROPOFOL N/A 12/04/2021   Procedure: ESOPHAGOGASTRODUODENOSCOPY (EGD) WITH PROPOFOL;  Surgeon:  Ronnette Juniper, MD;  Location: [facility];  Service: Gastroenterology;  Laterality: N/A;   INCISION AND DRAINAGE ABSCESS N/A 02/24/2021   Procedure: INCISION AND DRAINAGE PERINEUM;  Surgeon:  Kieth Brightly, Arta Bruce, MD;  Location: [facility];  Service: General;  Laterality: N/A;   INCISION AND DRAINAGE ABSCESS N/A 02/25/2021   Procedure: REPEAT WASHOUT OF PERINEUM;  Surgeon: Kieth Brightly Arta Bruce, MD;  Location: [facility];  Service: General;  Laterality: N/A;   LAPAROSCOPIC DIVERTED COLOSTOMY N/A 02/25/2021   Procedure: LAPAROSCOPIC DIVERTING SIGMOID COLOSTOMY;  Surgeon: Mickeal Skinner, MD;  Location: [facility];  Service: General;  Laterality: N/A;   LEFT HEART CATH AND CORONARY ANGIOGRAPHY N/A 05/18/2021   Procedure: LEFT HEART CATH AND CORONARY ANGIOGRAPHY;  Surgeon: Lorretta Harp, MD;  Location: [facility];  Service: Cardiovascular;  Laterality: N/A;    Prior to Admission medications   Medication Sig Start Date End Date Taking? Authorizing Provider  aspirin EC 81 MG tablet Take 1 tablet (81 mg total) by mouth daily. Swallow whole. 07/15/22  Yes Elouise Munroe, MD  atorvastatin (LIPITOR) 40 MG tablet Take 1 tablet (40 mg total) by mouth daily. 07/15/22  Yes Elouise Munroe, MD  calcium carbonate (TUMS - DOSED IN MG ELEMENTAL CALCIUM) 500 MG chewable tablet Chew 1 tablet by mouth at bedtime.   Yes [provider]  furosemide (LASIX) 40 MG tablet Take 1 tablet (40 mg total) by mouth daily. 07/15/22  Yes Elouise Munroe, MD  insulin glargine (LANTUS) 100 UNIT/ML Solostar Pen Inject 25 Units into the skin daily. 07/29/22  Yes Iona Beard, MD  insulin lispro (HUMALOG KWIKPEN) 100 UNIT/ML KwikPen Inject 8 Units into the skin 3 (three) times daily. 07/29/22 08/17/24 Yes Iona Beard, MD  Insulin Pen Needle (PEN NEEDLES) 32G X 4 MM MISC 1 Units by Does not apply route 4 (four) times daily. 07/29/22  Yes Iona Beard, MD  Iron, Ferrous Sulfate, 325 (65 Fe) MG TABS Take 1 tablet by mouth daily. 06/02/21  Yes [provider]  isosorbide mononitrate (IMDUR) 30 MG 24 hr tablet Take 3 tablets (90 mg total) by mouth daily. 07/15/22  Yes Elouise Munroe, MD   metFORMIN (GLUCOPHAGE-XR) 750 MG 24 hr tablet Take 1 tablet (750 mg total) by mouth 2 (two) times daily. 03/10/21  Yes Mercy Riding, MD  montelukast (SINGULAIR) 10 MG tablet Take 10 mg by mouth daily. 05/15/22  Yes [provider]  pantoprazole (PROTONIX) 40 MG tablet Take 1 tablet (40 mg total) by mouth daily. 07/27/22 07/22/23 Yes Daryel November, MD  potassium chloride SA (KLOR-CON M) 20 MEQ tablet Take 1 tablet (20 mEq total) by mouth 2 (two) times daily. 12/24/21  Yes Rancour, Annie Main, MD  acetaminophen (TYLENOL) 500 MG tablet Take 1,000-1,500 mg by mouth 2 (two) times daily as needed for moderate pain.    [provider]  Continuous Blood Gluc Sensor (DEXCOM G7 SENSOR) MISC 1 Units by Does not apply route once a week. 07/05/22 10/03/22  Iona Beard, MD  Homeopathic Products (LEG CRAMPS) TABS Take 3 tablets by mouth daily as needed (leg cramps).    [provider]  insulin NPH-regular Human (70-30) 100 UNIT/ML injection Inject 20-30 Units into the skin See admin instructions. Inject 20 units in AM and 30 units in PM Patient not taking: Reported on 08/26/2022    [provider]  metoprolol succinate (TOPROL-XL) 25 MG 24 hr tablet Take 1 tablet (25 mg total) by mouth daily.  Patient not taking: Reported on 08/26/2022 07/08/21   Marcelino Duster, PA  Multiple Vitamin (MULTIVITAMIN WITH MINERALS) TABS tablet Take 1 tablet by mouth daily. 03/10/21   Almon Hercules, MD  nitroGLYCERIN (NITROSTAT) 0.4 MG SL tablet Place 0.4 mg under the tongue every 5 (five) minutes as needed for chest pain.    [provider]    Current Facility-Administered Medications  Medication Dose Route Frequency Provider Last Rate Last Admin   0.9 %  sodium chloride infusion   Intravenous Continuous Jenel Lucks, MD       lactated ringers infusion   Intravenous Continuous Jenel Lucks, MD 50 mL/hr at 08/30/22 0722 New Bag at 08/30/22 0722   Current Outpatient  Medications  Medication Sig Dispense Refill   aspirin EC 81 MG tablet Take 1 tablet (81 mg total) by mouth daily. Swallow whole. 30 tablet 11   atorvastatin (LIPITOR) 40 MG tablet Take 1 tablet (40 mg total) by mouth daily. 90 tablet 1   calcium carbonate (TUMS - DOSED IN MG ELEMENTAL CALCIUM) 500 MG chewable tablet Chew 1 tablet by mouth at bedtime.     furosemide (LASIX) 40 MG tablet Take 1 tablet (40 mg total) by mouth daily. 90 tablet 1   insulin glargine (LANTUS) 100 UNIT/ML Solostar Pen Inject 25 Units into the skin daily. 15 mL 11   insulin lispro (HUMALOG KWIKPEN) 100 UNIT/ML KwikPen Inject 8 Units into the skin 3 (three) times daily. 15 mL 4   Insulin Pen Needle (PEN NEEDLES) 32G X 4 MM MISC 1 Units by Does not apply route 4 (four) times daily. 200 each 3   Iron, Ferrous Sulfate, 325 (65 Fe) MG TABS Take 1 tablet by mouth daily.     isosorbide mononitrate (IMDUR) 30 MG 24 hr tablet Take 3 tablets (90 mg total) by mouth daily. 270 tablet 3   metFORMIN (GLUCOPHAGE-XR) 750 MG 24 hr tablet Take 1 tablet (750 mg total) by mouth 2 (two) times daily. 90 tablet 1   montelukast (SINGULAIR) 10 MG tablet Take 10 mg by mouth daily.     pantoprazole (PROTONIX) 40 MG tablet Take 1 tablet (40 mg total) by mouth daily. 90 tablet 3   potassium chloride SA (KLOR-CON M) 20 MEQ tablet Take 1 tablet (20 mEq total) by mouth 2 (two) times daily. 6 tablet 0   acetaminophen (TYLENOL) 500 MG tablet Take 1,000-1,500 mg by mouth 2 (two) times daily as needed for moderate pain.     Continuous Blood Gluc Sensor (DEXCOM G7 SENSOR) MISC 1 Units by Does not apply route once a week. 3 each 3   Homeopathic Products (LEG CRAMPS) TABS Take 3 tablets by mouth daily as needed (leg cramps).     insulin NPH-regular Human (70-30) 100 UNIT/ML injection Inject 20-30 Units into the skin See admin instructions. Inject 20 units in AM and 30 units in PM (Patient not taking: Reported on 08/26/2022)     metoprolol succinate (TOPROL-XL) 25  MG 24 hr tablet Take 1 tablet (25 mg total) by mouth daily. (Patient not taking: Reported on 08/26/2022) 90 tablet 1   Multiple Vitamin (MULTIVITAMIN WITH MINERALS) TABS tablet Take 1 tablet by mouth daily. 90 tablet 1   nitroGLYCERIN (NITROSTAT) 0.4 MG SL tablet Place 0.4 mg under the tongue every 5 (five) minutes as needed for chest pain.      Allergies as of 07/27/2022 - Review Complete 07/27/2022  Allergen Reaction Noted   Adhesive [tape] Rash 03/30/2017  Family History  Problem Relation Age of Onset   CAD Mother    Colon cancer Father     Social History   Socioeconomic History   Marital status: Single    Spouse name: Not on file   Number of children: Not on file   Years of education: Not on file   Highest education level: Not on file  Occupational History   Not on file  Tobacco Use   Smoking status: Every Day    Packs/day: 0.50    Types: Cigarettes   Smokeless tobacco: Never   Tobacco comments:    Uses Nicorette Gum.   Vaping Use   Vaping Use: Never used  Substance and Sexual Activity   Alcohol use: Never   Drug use: Never   Sexual activity: Not on file  Other Topics Concern   Not on file  Social History Narrative   Not on file   Social Determinants of Health   Financial Resource Strain: High Risk (09/02/2021)   Overall Financial Resource Strain (CARDIA)    Difficulty of Paying Living Expenses: Very hard  Food Insecurity: No Food Insecurity (07/29/2022)   Hunger Vital Sign    Worried About Running Out of Food in the Last Year: Never true    Ran Out of Food in the Last Year: Never true  Transportation Needs: No Transportation Needs (09/02/2021)   PRAPARE - Hydrologist (Medical): No    Lack of Transportation (Non-Medical): No  Physical Activity: Not on file  Stress: Not on file  Social Connections: Not on file  Intimate Partner Violence: Not At Risk (07/29/2022)   Humiliation, Afraid, Rape, and Kick questionnaire    Fear of  Current or Ex-Partner: No    Emotionally Abused: No    Physically Abused: No    Sexually Abused: No    Review of Systems:  All other review of systems negative except as mentioned in the HPI.  Physical Exam: Vital signs BP (!) 144/70   Pulse (!) 106   Temp 98.1 F (36.7 C)   Resp 18   Ht 5\' 11"  (1.803 m)   Wt 95.3 kg   SpO2 100%   BMI 29.29 kg/m   General:   Alert,  Well-developed, well-nourished, pleasant and cooperative in NAD Airway:  Mallampati 2 Lungs:  Clear throughout to auscultation.   Heart:  Regular rate and rhythm; no murmurs, clicks, rubs,  or gallops. Abdomen:  Soft, nontender and nondistended. Normal bowel sounds.   Neuro/Psych:  Normal mood and affect. A and O x 3   Gavrielle Streck E. Candis Schatz, MD [facility]

## 2022-09-08 ENCOUNTER — Ambulatory Visit (INDEPENDENT_AMBULATORY_CARE_PROVIDER_SITE_OTHER): Payer: Medicaid Other | Admitting: Dietician

## 2022-09-08 ENCOUNTER — Encounter: Payer: Self-pay | Admitting: Internal Medicine

## 2022-09-08 ENCOUNTER — Ambulatory Visit: Payer: Medicaid Other | Admitting: Internal Medicine

## 2022-09-08 ENCOUNTER — Other Ambulatory Visit: Payer: Self-pay

## 2022-09-08 VITALS — BP 100/59 | HR 100 | Temp 98.0°F | Ht 71.0 in | Wt 204.2 lb

## 2022-09-08 DIAGNOSIS — E1165 Type 2 diabetes mellitus with hyperglycemia: Secondary | ICD-10-CM

## 2022-09-08 DIAGNOSIS — E119 Type 2 diabetes mellitus without complications: Secondary | ICD-10-CM

## 2022-09-08 DIAGNOSIS — F32A Depression, unspecified: Secondary | ICD-10-CM

## 2022-09-08 DIAGNOSIS — Z794 Long term (current) use of insulin: Secondary | ICD-10-CM

## 2022-09-08 LAB — POCT GLYCOSYLATED HEMOGLOBIN (HGB A1C): Hemoglobin A1C: 12.7 % — AB (ref 4.0–5.6)

## 2022-09-08 LAB — GLUCOSE, CAPILLARY: Glucose-Capillary: 474 mg/dL — ABNORMAL HIGH (ref 70–99)

## 2022-09-08 MED ORDER — METFORMIN HCL 1000 MG PO TABS: 1000.0000 mg | ORAL_TABLET | Freq: Two times a day (BID) | ORAL | 1 refills | Status: DC

## 2022-09-08 MED ORDER — INSULIN GLARGINE 100 UNIT/ML SOLOSTAR PEN: 35.0000 [IU] | PEN_INJECTOR | Freq: Every day | SUBCUTANEOUS | 11 refills | Status: DC
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
## 2022-09-08 NOTE — Patient Instructions (Signed)
Dear Mr. Bartosiewicz,  Thank you for trusting [name] with your care today. We discussed your diabetes. We have sent in prescriptions for metformin 1000mg  to take twice daily. I have also increased your Lantus to 35 units at night. Please continue taking your humalog.   We would like to see you back in the clinic in 2 weeks.
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
## 2022-09-08 NOTE — Patient Instructions (Addendum)
Thank you for bringing your Dexcom Reader today!  Remember to rotate your injection sites as we discussed.   Let's meet again on the same day you see the doctor in 2 weeks.   Please bring all your diabetes medicines with you including your insulin.   Some healthy snack or lunch food ideas:  Beets Sweet potatoes Celery and peanut butter Cucumbers, carrots and hummus   Breakfast:  [place] sausage or chicken or plant based sausage for breakfast.   [name] [phone]

## 2022-09-10 ENCOUNTER — Encounter: Payer: Self-pay | Admitting: Internal Medicine

## 2022-09-10 DIAGNOSIS — F32A Depression, unspecified: Secondary | ICD-10-CM | POA: Insufficient documentation

## 2022-09-10 NOTE — Assessment & Plan Note (Signed)
Related to current illness DM and colostomy. Patient denies SI/HI. Says that he loves life, but is saddened by his current state of health.   PHQ9 =12, will discuss trial of SSRI at follow up.
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
## 2022-09-13 NOTE — Progress Notes (Signed)
Internal Medicine Clinic Attending  Case discussed with the resident at the time of the visit.  We reviewed the resident's history and exam and pertinent patient test results.  I agree with the assessment, diagnosis, and plan of care documented in the resident's note.  

## 2022-09-21 ENCOUNTER — Ambulatory Visit (INDEPENDENT_AMBULATORY_CARE_PROVIDER_SITE_OTHER): Payer: Medicaid Other | Admitting: Dietician

## 2022-09-21 ENCOUNTER — Encounter: Payer: Self-pay | Admitting: Internal Medicine

## 2022-09-21 ENCOUNTER — Other Ambulatory Visit: Payer: Self-pay

## 2022-09-21 ENCOUNTER — Encounter: Payer: Self-pay | Admitting: Dietician

## 2022-09-21 ENCOUNTER — Ambulatory Visit: Payer: Medicaid Other | Admitting: Internal Medicine

## 2022-09-21 VITALS — BP 133/70 | HR 92 | Temp 97.6°F | Ht 71.0 in | Wt 210.2 lb

## 2022-09-21 VITALS — Wt 210.2 lb

## 2022-09-21 DIAGNOSIS — R0789 Other chest pain: Secondary | ICD-10-CM | POA: Diagnosis not present

## 2022-09-21 DIAGNOSIS — Z794 Long term (current) use of insulin: Secondary | ICD-10-CM

## 2022-09-21 DIAGNOSIS — I5033 Acute on chronic diastolic (congestive) heart failure: Secondary | ICD-10-CM

## 2022-09-21 DIAGNOSIS — I110 Hypertensive heart disease with heart failure: Secondary | ICD-10-CM

## 2022-09-21 DIAGNOSIS — E7800 Pure hypercholesterolemia, unspecified: Secondary | ICD-10-CM

## 2022-09-21 DIAGNOSIS — G4733 Obstructive sleep apnea (adult) (pediatric): Secondary | ICD-10-CM

## 2022-09-21 DIAGNOSIS — F32A Depression, unspecified: Secondary | ICD-10-CM

## 2022-09-21 DIAGNOSIS — E1151 Type 2 diabetes mellitus with diabetic peripheral angiopathy without gangrene: Secondary | ICD-10-CM

## 2022-09-21 DIAGNOSIS — E1165 Type 2 diabetes mellitus with hyperglycemia: Secondary | ICD-10-CM

## 2022-09-21 DIAGNOSIS — I25119 Atherosclerotic heart disease of native coronary artery with unspecified angina pectoris: Secondary | ICD-10-CM

## 2022-09-21 DIAGNOSIS — R0782 Intercostal pain: Secondary | ICD-10-CM | POA: Diagnosis present

## 2022-09-21 DIAGNOSIS — F17210 Nicotine dependence, cigarettes, uncomplicated: Secondary | ICD-10-CM

## 2022-09-21 DIAGNOSIS — E119 Type 2 diabetes mellitus without complications: Secondary | ICD-10-CM

## 2022-09-21 DIAGNOSIS — I10 Essential (primary) hypertension: Secondary | ICD-10-CM

## 2022-09-21 DIAGNOSIS — I739 Peripheral vascular disease, unspecified: Secondary | ICD-10-CM

## 2022-09-21 MED ORDER — METOPROLOL SUCCINATE ER 25 MG PO TB24: 25.0000 mg | ORAL_TABLET | Freq: Every day | ORAL | 1 refills | Status: DC

## 2022-09-21 MED ORDER — CYCLOBENZAPRINE HCL 5 MG PO TABS: 5.0000 mg | ORAL_TABLET | Freq: Every day | ORAL | 0 refills | Status: AC

## 2022-09-21 MED ORDER — INSULIN GLARGINE 100 UNIT/ML SOLOSTAR PEN: 35.0000 [IU] | PEN_INJECTOR | Freq: Every day | SUBCUTANEOUS | 11 refills | Status: DC

## 2022-09-21 MED ORDER — INSULIN GLARGINE 100 UNIT/ML SOLOSTAR PEN: 45.0000 [IU] | PEN_INJECTOR | Freq: Every day | SUBCUTANEOUS | 11 refills | Status: DC

## 2022-09-21 MED ORDER — ASPIRIN 81 MG PO TBEC: 81.0000 mg | DELAYED_RELEASE_TABLET | Freq: Every day | ORAL | 11 refills | Status: DC

## 2022-09-21 MED ORDER — INSULIN LISPRO (1 UNIT DIAL) 100 UNIT/ML (KWIKPEN): 8.0000 [IU] | PEN_INJECTOR | Freq: Three times a day (TID) | SUBCUTANEOUS | 4 refills | Status: DC

## 2022-09-21 MED ORDER — SERTRALINE HCL 25 MG PO TABS: 25.0000 mg | ORAL_TABLET | Freq: Every day | ORAL | 2 refills | Status: DC

## 2022-09-21 MED ORDER — MONTELUKAST SODIUM 10 MG PO TABS: 10.0000 mg | ORAL_TABLET | Freq: Every day | ORAL | 2 refills | Status: DC

## 2022-09-21 MED ORDER — METFORMIN HCL 1000 MG PO TABS: 1000.0000 mg | ORAL_TABLET | Freq: Two times a day (BID) | ORAL | 1 refills | Status: DC

## 2022-09-21 MED ORDER — ATORVASTATIN CALCIUM 40 MG PO TABS: 40.0000 mg | ORAL_TABLET | Freq: Every day | ORAL | 1 refills | Status: DC

## 2022-09-21 MED ORDER — PANTOPRAZOLE SODIUM 40 MG PO TBEC: 40.0000 mg | DELAYED_RELEASE_TABLET | Freq: Every day | ORAL | 3 refills | Status: DC

## 2022-09-21 MED ORDER — INSULIN LISPRO (1 UNIT DIAL) 100 UNIT/ML (KWIKPEN): 10.0000 [IU] | PEN_INJECTOR | Freq: Three times a day (TID) | SUBCUTANEOUS | 4 refills | Status: DC

## 2022-09-21 MED ORDER — FUROSEMIDE 40 MG PO TABS: 40.0000 mg | ORAL_TABLET | Freq: Every day | ORAL | 1 refills | Status: DC
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
## 2022-09-21 NOTE — Patient Instructions (Addendum)
I think you are doing a great job taking your medicines and using the Dexcom Continuous glucose monitor.    Keep trying to eat well balanced meals as you are. You may need a bit more insulin and can work with your doctor on this.   I'd like to see you back in 1- 2 months on the same day you see the doctor if that works out.   Still need to cover DDS-2, chronic complications and physical activity before completing Diabetes Self Management Education & Support cycle.   [name] [phone]

## 2022-09-21 NOTE — Patient Instructions (Addendum)
Dear Andres Richardson,  Thank you for trusting [name] with your care.   We discussed your diabetes, pain, and depression.   For your diabetes, it is improving. We are increasing the long acting insulin to 45 units nightly. We are increasing the meal time insulin to 10 units with meals.  For the chest pain, please take 1000mg  of tylenol every 8 hours. Please purchase OTC lidocaine patches and apply to the area. I have also written for a muscle relaxer to be taken at night if the pain is not controlled with the tylenol and lidocaine. We would also like for your to get an xray of your chest.   For your depression, I have placed a referral to our therapist. I have also sent in a prescription for sertraline. Please take the sertraline at least 1 month to experience any benefit from the medicine.  We would like to see you back in the clinic to follow up these issues in 2 weeks.

## 2022-09-21 NOTE — Progress Notes (Signed)
Diabetes Self-Management Education  Visit Type:  Follow-up (#2 after the initial)  Appt. Start Time: 1320 Appt. End Time: 1355  09/21/2022  [name], identified by name and date of birth, is a 57 y.o. male with a diagnosis of Diabetes:  Marland Kitchen  Type 2  ASSESSMENT Took insulin before eating breakfast today at 11 am Breakfast was 2 english muffins ( halves) with sausage and cheese and tomatoes. Has not gone shopping to get healthier sausage, veggies for snacks etc . Assured me that he is only drinking low sugar/carb drinks, Ice and water. Taking metformin twice a day as directed  His goal is to keep blood sugars between  "150-175" better diabetes control, then they can consider reversing bag, procedures on throat, etc.    Has not missed Humalog and only missed the long acting once in the past 2 weeks Weight 210 lb 3.2 oz (95.3 kg). Body mass index is 29.32 kg/m. Wt Readings from Last 10 Encounters:  09/21/22 210 lb 3.2 oz (95.3 kg)  09/21/22 210 lb 3.2 oz (95.3 kg)  09/08/22 204 lb 3.2 oz (92.6 kg)  08/30/22 210 lb (95.3 kg)  07/29/22 203 lb 6.4 oz (92.3 kg)  07/27/22 201 lb (91.2 kg)  07/05/22 201 lb (91.2 kg)  06/18/22 204 lb (92.5 kg)  06/02/22 202 lb 9.6 oz (91.9 kg)  05/14/22 200 lb (90.7 kg)   Still need to cover DDS-2, chronic complications and physical activity before completing Diabetes Self Management Education & Support cycle.     Diabetes Self-Management Education - 09/21/22 1400       Health Coping   How would you rate your overall health? Fair   feels off due to blood sugars up and down     Pre-Education Assessment   Patient understands the diabetes disease and treatment process. Comprehends key points      Patient Education   Medications Reviewed patients medication for diabetes, action, purpose, timing of dose and side effects.    Acute complications Discussed and identified patients' prevention, symptoms, and treatment of hyperglycemia.      Patient  Self-Evaluation of Goals - Patient rates self as meeting previously set goals (% of time)   Monitoring >75% (most of the time)      Outcomes   Program Status Not Completed      Subsequent Visit   Since your last visit have you continued or begun to take your medications as prescribed? Yes   only missed long acting insulin once in 14 days, never missed short actign tha he takes twice daily because he only eats wo meals a day   Since your last visit have you had your blood pressure checked? No    Since your last visit have you experienced any weight changes? Gain    Weight Gain (lbs) 6    Since your last visit, are you checking your blood glucose at least once a day? Yes             Learning Objective:  Patient will have a greater understanding of diabetes self-management. Patient education plan is to attend individual and/or group sessions per assessed needs and concerns.   Plan:   Patient Instructions  I think you are doing a great job taking your medicines and using the Dexcom Continuous glucose monitor.    Keep trying to eat well balanced meals as you are. You may need a bit more insulin and can work with your doctor on this.  I'd like to see you back in 2 months or sooner on the same day you see the doctor if that works out.   [name] [phone]       Expected Outcomes:  Demonstrated interest in learning. Expect positive outcomes  Education material provided: Diabetes Resources  If problems or questions, patient to contact team via:  Phone  Future DSME appointment: - 4-6 wks Debera Lat, RD 09/21/2022 2:13 PM.

## 2022-09-22 ENCOUNTER — Ambulatory Visit: Payer: Medicaid Other | Admitting: Podiatry

## 2022-09-24 ENCOUNTER — Encounter: Payer: Self-pay | Admitting: Internal Medicine

## 2022-09-24 DIAGNOSIS — R0789 Other chest pain: Secondary | ICD-10-CM | POA: Insufficient documentation

## 2022-09-24 DIAGNOSIS — G8929 Other chronic pain: Secondary | ICD-10-CM | POA: Insufficient documentation

## 2022-09-24 NOTE — Assessment & Plan Note (Signed)
Insulin increased to 35 units last time because AM fasting was quite elevated and metformin increased to 1000 BID. Instructed to take his 7 units TID with meals. He is taking 8  blood sugar is looding better, but remains elevated.  We are increasing the long acting insulin to 45 units nightly. We are increasing the meal time insulin to 10 units with meals. 2 week follow up.

## 2022-09-24 NOTE — Assessment & Plan Note (Signed)
Patient states he has tried SSRIs in the past but did not notice much benefit, however he only tried for 2 weeks.  He is willing to retry this.  He is interested in meeting with behavioral health.  Depression has been chronic at this point ongoing for several years and warrants treatment.  Will retry sertraline and counseled patient that he will need to take medication for at least 1 month before noticing any benefit.  Referral to behavioral health therapist placed as well.

## 2022-09-24 NOTE — Assessment & Plan Note (Signed)
Describes aching recommending a left pectoral that will radiate along the left side of his chest and down into his left abdomen.  It is intermittent.  It is reproducible with palpation.  He did denies any particular precipitating factors.  He is not having any dizziness, lightheadedness, shortness of breath.  Pain does not radiate to the arm with no associated numbness or tingling.  No nausea or vomiting.  This has been chronic in nature since September.  No particular inciting event.  When it first began he called his heart doctor believing it was related to heart, but states cardiology did not believe so.  He has only tried aspirin to help but states this is not controlling pain anymore.  Pain is reproducible with palpation along left chest wall.  No obvious deformities.  Heart is regular rate and rhythm and lungs are clear to auscultation.  Given his history, and reproducible nature of pain, most likely musculoskeletal in origin.  Differentials include costochondritis versus rib fracture versus muscle spasm.  Advised patient is 1000 mg of Tylenol every 8 hours.  Recommended over-the-counter lidocaine patches ply directly to the area.  Will provide muscle relaxer to be taken at night if pain is not controlled with Tylenol and lidocaine.  Given that there is no particular precipitating factors, will order x-ray of chest to evaluate for rib fracture.

## 2022-09-30 ENCOUNTER — Ambulatory Visit: Payer: Medicaid Other | Admitting: Podiatry

## 2022-09-30 DIAGNOSIS — L97512 Non-pressure chronic ulcer of other part of right foot with fat layer exposed: Secondary | ICD-10-CM | POA: Diagnosis not present

## 2022-09-30 MED ORDER — GENTAMICIN SULFATE 0.1 % EX OINT: 1.0000 | TOPICAL_OINTMENT | Freq: Every day | CUTANEOUS | 3 refills | Status: DC

## 2022-09-30 NOTE — Progress Notes (Signed)
Internal Medicine Clinic Attending  I saw and evaluated the patient.  I personally confirmed the key portions of the history and exam documented by Dr. Gawaluck and I reviewed pertinent patient test results.  The assessment, diagnosis, and plan were formulated together and I agree with the documentation in the resident's note.  

## 2022-09-30 NOTE — Progress Notes (Signed)
  Subjective:  Patient ID: Andres Richardson, male    DOB: 1966/07/20,  MRN: [id number]  Chief Complaint  Patient presents with   Foot Ulcer    4 week follow up right foot    57 y.o. male presents with the above complaint. History confirmed with patient.  He said it is doing okay he has been using the gentamicin ointment  Objective:  Physical Exam: Weakly palpable pedal pulses foot is fairly warm and perfused well, the ulceration has returned deep to the hyperkeratotic tissue measuring 0.5 x 0.8 x 0.3 cm.  No purulence drainage or evidence of infection      Assessment:   1. Ulcer of right foot with fat layer exposed ([place])      Plan:  Patient was evaluated and treated and all questions answered.   Ulcer right foot -Doing much better.  Less drainage.  Continue use gentamicin ointment.  Refill sent to pharmacy.  Full-thickness excisional debridement was completed with a sharp scalpel to the subcutaneous layer to remove all nonviable tissue, hyperkeratosis and slough.  Return in 3 weeks for ongoing wound care  Return in about 3 weeks (around 10/21/2022) for wound care.

## 2022-10-01 ENCOUNTER — Ambulatory Visit (HOSPITAL_COMMUNITY)
Admission: RE | Admit: 2022-10-01 | Discharge: 2022-10-01 | Disposition: A | Discharge status: Home / Self Care | Payer: Medicaid Other | Source: Ambulatory Visit | Attending: Internal Medicine | Admitting: Internal Medicine

## 2022-10-01 DIAGNOSIS — R0782 Intercostal pain: Secondary | ICD-10-CM | POA: Insufficient documentation

## 2022-10-05 ENCOUNTER — Ambulatory Visit: Payer: Medicaid Other

## 2022-10-05 VITALS — BP 146/75 | HR 102 | Temp 98.2°F | Ht 71.0 in | Wt 211.6 lb

## 2022-10-05 DIAGNOSIS — E1165 Type 2 diabetes mellitus with hyperglycemia: Secondary | ICD-10-CM | POA: Diagnosis not present

## 2022-10-05 DIAGNOSIS — M899 Disorder of bone, unspecified: Secondary | ICD-10-CM

## 2022-10-05 DIAGNOSIS — R809 Proteinuria, unspecified: Secondary | ICD-10-CM | POA: Diagnosis present

## 2022-10-05 DIAGNOSIS — F17210 Nicotine dependence, cigarettes, uncomplicated: Secondary | ICD-10-CM | POA: Diagnosis not present

## 2022-10-05 DIAGNOSIS — F32A Depression, unspecified: Secondary | ICD-10-CM | POA: Diagnosis not present

## 2022-10-05 DIAGNOSIS — E1121 Type 2 diabetes mellitus with diabetic nephropathy: Secondary | ICD-10-CM | POA: Diagnosis not present

## 2022-10-05 DIAGNOSIS — Z794 Long term (current) use of insulin: Secondary | ICD-10-CM | POA: Diagnosis not present

## 2022-10-05 DIAGNOSIS — I10 Essential (primary) hypertension: Secondary | ICD-10-CM

## 2022-10-05 DIAGNOSIS — R0789 Other chest pain: Secondary | ICD-10-CM | POA: Diagnosis not present

## 2022-10-05 DIAGNOSIS — M898X9 Other specified disorders of bone, unspecified site: Secondary | ICD-10-CM

## 2022-10-05 DIAGNOSIS — N179 Acute kidney failure, unspecified: Secondary | ICD-10-CM

## 2022-10-05 MED ORDER — DICLOFENAC SODIUM 1 % EX GEL: 4.0000 g | Freq: Four times a day (QID) | CUTANEOUS | 0 refills | Status: AC

## 2022-10-05 NOTE — Progress Notes (Unsigned)
Uncontrolled T2DM DKA hx Insulin increased to 35 units last time because AM fasting was quite elevated and metformin increased to 1000 BID. Instructed to take his 7 units TID with meals. He is taking 8   blood sugar is looding better, but remains elevated.  We are increasing the long acting insulin to 45 units nightly. We are increasing the meal time insulin to 10 units with meals. 2 week follow up.  R foot ulcer Doing much better. Less drainage. Continue use gentamicin ointment. Refill sent to pharmacy. Full-thickness excisional debridement was completed with a sharp scalpel to the subcutaneous layer to remove all nonviable tissue, hyperkeratosis and slough. Return in 3 weeks for ongoing wound care . Follow with podiatry, seen 2/15  Elevated BP of 142/73, similar on repeat. In past has mostly low pressure. Will continue to monitor this. If persistently elevated may benefit from antihypertensive.   Evaluation prior to ostomy reversal, family history of colon cancer, follow up reflux esophagitis -cancelled 1/15 due to BG 400s Reports history of iron deficiency on iron supplementation. Denies signs of blood loss. Last hgb 14 in August. Will check iron studies. Has colonoscopy and EGD scheduled for 08/30/2021.   Depression has been chronic at this point ongoing for several years and warrants treatment. Will retry sertraline and counseled patient that he will need to take medication for at least 1 month before noticing any benefit. Referral to behavioral health therapist placed as well.  Zoloft 25, increase to 50??  HFpEF Metop Imdur Lasix  coronary artery disease and peripheral arterial disease with known left-sided common iliac artery occlusion  40% LAD stenosis, 60% L circumflex, RCA 100% stenosed ABI R 0.71, L 0.51 06/2022 with vasc surgery Chronic anemia=no dapt -asa Metop 25 daily Atorvastatin 40  A1c 12.7 1/24 Microalbumin 2.5K 07/2022=> SHOULD BE ON ACE/ARB, NO SGLT2 WITH HX  FOURNIER Cmp cr 1.36 03/2022 LDL 44 04/2021 [facility] hcv Optho   Describes aching recommending a left pectoral that will radiate along the left side of his chest and down into his left abdomen.  It is intermittent.  It is reproducible with palpation.  He did denies any particular precipitating factors.  He is not having any dizziness, lightheadedness, shortness of breath.  Pain does not radiate to the arm with no associated numbness or tingling.  No nausea or vomiting.  This has been chronic in nature since September.  No particular inciting event.  When it first began he called his heart doctor believing it was related to heart, but states cardiology did not believe so.  He has only tried aspirin to help but states this is not controlling pain anymore.   Pain is reproducible with palpation along left chest wall.  No obvious deformities.  Heart is regular rate and rhythm and lungs are clear to auscultation.   Given his history, and reproducible nature of pain, most likely musculoskeletal in origin.  Differentials include costochondritis versus rib fracture versus muscle spasm.  Advised patient is 1000 mg of Tylenol every 8 hours.  Recommended over-the-counter lidocaine patches ply directly to the area.  Will provide muscle relaxer to be taken at night if pain is not controlled with Tylenol and lidocaine.  Given that there is no particular precipitating factors, will order x-ray of chest to evaluate for rib fracture.

## 2022-10-05 NOTE — Patient Instructions (Signed)
Thank you, Andres Richardson for allowing [name] to provide your care today. Today we discussed :  Chest pain: Your xray shows erosive bony changes. We will order a CT scan to better evaluate this. Please take tylenol 1000 mg 3 times daily. We are also prescribing voltaren gel to rub on to the area. We will like to check a PSA to screen for prostate cancer which can cause some bony changes.  Diabetes: Continue taking 35u long acting insulin nightly and 10u short acting insulin with meals. We will see you back in 2 weeks to reevaluate.  Hypertension diabetic kidney disease: I would like to start you on a medicine called losartan. Your kidney function was a little reduced last time, I want to check this again before starting the medicine. This will help protect your kidneys from further damage from diabetes and helps with blood pressure.   I have ordered the following labs for you:   Lab Orders         CMP14 + Anion Gap         PSA        Referrals ordered today:   Referral Orders  No referral(s) requested today     I have ordered the following medication/changed the following medications:   Stop the following medications: There are no discontinued medications.   Start the following medications: Meds ordered this encounter  Medications   diclofenac Sodium (VOLTAREN) 1 % GEL    Sig: Apply 4 g topically 4 (four) times daily.    Dispense:  150 g    Refill:  0     Follow up: 2 weeks    We look forward to seeing you next time. Please call our clinic at [phone] if you have any questions or concerns. The best time to call is Monday-Friday from 9am-4pm, but there is someone available 24/7. If after hours or the weekend, call the main hospital number and ask for the Internal Medicine Resident On-Call. If you need medication refills, please notify your pharmacy one week in advance and they will send [name] a request.   Thank you for trusting me with your care. Wishing you the best!    Iona Coach, MD [place]

## 2022-10-06 DIAGNOSIS — I10 Essential (primary) hypertension: Secondary | ICD-10-CM | POA: Insufficient documentation

## 2022-10-06 DIAGNOSIS — R809 Proteinuria, unspecified: Secondary | ICD-10-CM | POA: Insufficient documentation

## 2022-10-06 DIAGNOSIS — E1121 Type 2 diabetes mellitus with diabetic nephropathy: Secondary | ICD-10-CM | POA: Insufficient documentation

## 2022-10-06 LAB — CMP14 + ANION GAP
ALT: 8 IU/L (ref 0–44)
AST: 11 IU/L (ref 0–40)
Albumin/Globulin Ratio: 1.2 (ref 1.2–2.2)
Albumin: 3.3 g/dL — ABNORMAL LOW (ref 3.8–4.9)
Alkaline Phosphatase: 86 IU/L (ref 44–121)
Anion Gap: 15 mmol/L (ref 10.0–18.0)
BUN/Creatinine Ratio: 18 (ref 9–20)
BUN: 16 mg/dL (ref 6–24)
Bilirubin Total: <0.2 mg/dL (ref 0.0–1.2)
CO2: 24 mmol/L (ref 20–29)
Calcium: 8.6 mg/dL — ABNORMAL LOW (ref 8.7–10.2)
Chloride: 105 mmol/L (ref 96–106)
Creatinine, Ser: 0.91 mg/dL (ref 0.76–1.27)
Globulin, Total: 2.8 g/dL (ref 1.5–4.5)
Glucose: 212 mg/dL — ABNORMAL HIGH (ref 70–99)
Potassium: 3.9 mmol/L (ref 3.5–5.2)
Sodium: 144 mmol/L (ref 134–144)
Total Protein: 6.1 g/dL (ref 6.0–8.5)
eGFR: 98 mL/min/{1.73_m2} (ref >59)

## 2022-10-06 LAB — PSA: Prostate Specific Ag, Serum: 0.3 ng/mL (ref 0.0–4.0)

## 2022-10-06 MED ORDER — LOSARTAN POTASSIUM 50 MG PO TABS: 50.0000 mg | ORAL_TABLET | Freq: Every day | ORAL | 1 refills | Status: DC

## 2022-10-06 NOTE — Assessment & Plan Note (Signed)
Most recent A1c 12.7% 09/08/22. Recently seen in clinic for insulin titration to 45u long acting nightly and 10u BID short acting for his two meals per day. Reports eating breakfast 9-10am and dinner 5-7 PM. Patient unable to tolerate 45 or 40 u long acting. Getting low BG to 30-40s on these doses. Appears to be getting lows around 6AM. Says on 35u(prior dose with uncontrolled sugars), BG only as low as 100-110. Also reports taking 11u with meals instead of 10. No snack insulin coverage.  On CGM 42% very high,27% high, 31% in range, 0% low, <1% very low. Appears after breakfast BG remains elevated for remainder of day. Avg was 229. Hard to determin the pattern overall given 3 different insulin dosing regimens. No change today, will reevaluate in 2 weeks. Not sure that metformin is providing benefit to this patient as he has diabetes form pancreatitis, could consider stopping. -35u glargine nightly -10u meal time insulin -metformin 1000 BID(consider discontinuation, see note above)

## 2022-10-06 NOTE — Assessment & Plan Note (Signed)
Continues to have L sided chest pain. Starts out feeling like he is punched over the pectoral muscle. Numbness then radiates from peck down lateral chest wall until around the area of the 12ths rib. Throbs after this. Happens once every 30-60 minutes. Tylenol not helping much. Took some left over oxy 5 mg x2 during the night time and helped with the pain, but does endorse it made him fall asleep which may be the reason. Xray showed "Erosive changes along the undersurfaces of the posterior portion of the left eighth and ninth ribs cannot be excluded". Patient does have ptp in this area. Distribution and reproducible ptp not consistent with neuropathic pain. PSA normal, low suspicion for prostate cancer causing lytic rib lesions. Does have extensive smoking history, could be a bony met from an undiagnosed lung malignancy. Do not suspect an inflammatory arthritis to affect just the ribs. No hypercalcemia to suggest hyper parathyroidism, no elevated calcium, renal dysfunction, gamma gap to suggest myeloma. Do need to rule out primary bone neoplasm. Will get CT to better characterize ribs and to evaluate the lungs.  -tylenol 1000 mg TID -voltaren gel -NO oral/IV NSAIDs given cardiac disease and diabetic kidney disease -CT chest -consider opioid pain meds if no relief of pain

## 2022-10-06 NOTE — Progress Notes (Signed)
Called patient, left voicemail and will try again later.  AKI resolved, no electrolyte abnormalities. No evidence of hypercalcemia of gamma gap to suggest multiple myeloma as a cause for lytic bone lesions on xray. PSA negative, suggestive that this is not a prostate cancer metastasis to the bone. No change in management at this time, will need to follow up CT chest for better evaluation of the bony rib lesion and to evaluate for any pulmonary nodules suggesting lung cancer as a cause for lytic bone lesions.

## 2022-10-06 NOTE — Assessment & Plan Note (Signed)
Reports doing well on zoloft 25, does not want to increase dose at this time. No side effects. Reports good mood. Very excited his sugars are improving.

## 2022-10-06 NOTE — Assessment & Plan Note (Addendum)
eGFR normal at 98 but does have microalbumin:Cr of 2500 consistent with diabetic kidney disease. Started on ARB today. 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 Will recheck microalbumin: Cr level at next visit. Can titrate losartan to max tolerated, if persistent albuminuria could consider finerenone. -losartan 90m daily, titrate to max tolerated dose -f/u urine microalbumin at next visit -nephro referral if persistent severe range albuminuria

## 2022-10-06 NOTE — Progress Notes (Signed)
Established Patient Office Visit  Subjective   Patient ID: Andres Richardson, male    DOB: [birth year]  Age: 57 y.o. MRN: [id number]  Chief Complaint  Patient presents with   Follow-up    Mr. Sunderlin is a 57 y/o male with a pmh outlined below. Please see A&P for HPI information.      Review of Systems  All other systems reviewed and are negative.     Objective:     BP (!) 146/75 (BP Location: Right Arm, Patient Position: Sitting, Cuff Size: Normal)   Pulse (!) 102   Temp 98.2 F (36.8 C) (Oral)   Ht 5' 11"$  (1.803 m)   Wt 211 lb 9.6 oz (96 kg)   SpO2 100%   BMI 29.51 kg/m    Physical Exam Constitutional:      General: He is not in acute distress.    Appearance: Normal appearance. He is obese. He is not ill-appearing.  Eyes:     General: No scleral icterus.    Extraocular Movements: Extraocular movements intact.     Conjunctiva/sclera: Conjunctivae normal.  Cardiovascular:     Rate and Rhythm: Normal rate and regular rhythm.     Pulses: Normal pulses.     Heart sounds: Normal heart sounds. No murmur heard.    No gallop.  Pulmonary:     Effort: Pulmonary effort is normal.  Abdominal:     Comments: Colostomy bag in place  Musculoskeletal:        General: No swelling, deformity or signs of injury. Normal range of motion.     Right lower leg: No edema.     Left lower leg: No edema.     Comments: Ptp of the postero later chest wall around the level of the 8th rib. No overlying rash/skin changes, no deformity, no step offs or crepitus.  Skin:    General: Skin is warm and dry.     Capillary Refill: Capillary refill takes less than 2 seconds.  Neurological:     General: No focal deficit present.     Mental Status: He is alert.      Results for orders placed or performed in visit on 10/05/22  CMP14 + Anion Gap  Result Value Ref Range   Glucose 212 (H) 70 - 99 mg/dL   BUN 16 6 - 24 mg/dL   Creatinine, Ser 0.91 0.76 - 1.27 mg/dL   eGFR 98 >59 mL/min/1.73    BUN/Creatinine Ratio 18 9 - 20   Sodium 144 134 - 144 mmol/L   Potassium 3.9 3.5 - 5.2 mmol/L   Chloride 105 96 - 106 mmol/L   CO2 24 20 - 29 mmol/L   Anion Gap 15.0 10.0 - 18.0 mmol/L   Calcium 8.6 (L) 8.7 - 10.2 mg/dL   Total Protein 6.1 6.0 - 8.5 g/dL   Albumin 3.3 (L) 3.8 - 4.9 g/dL   Globulin, Total 2.8 1.5 - 4.5 g/dL   Albumin/Globulin Ratio 1.2 1.2 - 2.2   Bilirubin Total <0.2 0.0 - 1.2 mg/dL   Alkaline Phosphatase 86 44 - 121 IU/L   AST 11 0 - 40 IU/L   ALT 8 0 - 44 IU/L  PSA  Result Value Ref Range   Prostate Specific Ag, Serum 0.3 0.0 - 4.0 ng/mL      The ASCVD Risk score (Arnett DK, et al., 2019) failed to calculate for the following reasons:   The valid total cholesterol range is 130 to 320 mg/dL  Assessment & Plan:   Problem List Items Addressed This Visit       Cardiovascular and Mediastinum   Hypertension    BP elevated to 141/66 in clinic. Currently on metop, imdur and lasix for HFpEF. Given diabetic kidney disease with microalbumin:Cr of around 2500, will start ARB. CMP done and renal function and electrolytes wnl. -losartan 62m daily      Relevant Medications   losartan (COZAAR) 50 MG tablet   Other Relevant Orders   CMP14 + Anion Gap (Completed)     Endocrine   Uncontrolled type 2 diabetes mellitus with hyperglycemia, with long-term current use of insulin ([facility]    Most recent A1c 12.7% 09/08/22. Recently seen in clinic for insulin titration to 45u long acting nightly and 10u BID short acting for his two meals per day. Reports eating breakfast 9-10am and dinner 5-7 PM. Patient unable to tolerate 45 or 40 u long acting. Getting low BG to 30-40s on these doses. Appears to be getting lows around 6AM. Says on 35u(prior dose with uncontrolled sugars), BG only as low as 100-110. Also reports taking 11u with meals instead of 10. No snack insulin coverage.  On CGM 42% very high,27% high, 31% in range, 0% low, <1% very low. Appears after breakfast BG remains  elevated for remainder of day. Avg was 229. Hard to determin the pattern overall given 3 different insulin dosing regimens. No change today, will reevaluate in 2 weeks. Not sure that metformin is providing benefit to this patient as he has diabetes form pancreatitis, could consider stopping. -35u glargine nightly -10u meal time insulin -metformin 1000 BID(consider discontinuation, see note above)      Relevant Medications   losartan (COZAAR) 50 MG tablet   Diabetic kidney disease ([facility])    eGFR normal at 98 but does have microalbumin:Cr of 2500 consistent with diabetic kidney disease. Started on ARB today. 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 Will recheck microalbumin: Cr level at next visit. Can titrate losartan to max tolerated, if persistent albuminuria could consider finerenone. -losartan 544mdaily, titrate to max tolerated dose -f/u urine microalbumin at next visit -nephro referral if persistent severe range albuminuria      Relevant Medications   losartan (COZAAR) 50 MG tablet     Genitourinary   AKI (acute kidney injury) ([facility]  Relevant Orders   CMP14 + Anion Gap (Completed)     Other   Depression    Reports doing well on zoloft 25, does not want to increase dose at this time. No side effects. Reports good mood. Very excited his sugars are improving.      Chest pain, musculoskeletal    Continues to have L sided chest pain. Starts out feeling like he is punched over the pectoral muscle. Numbness then radiates from peck down lateral chest wall until around the area of the 12ths rib. Throbs after this. Happens once every 30-60 minutes. Tylenol not helping much. Took some left over oxy 5 mg x2 during the night time and helped with the pain, but does endorse it made him fall asleep which may be the reason. Xray showed "Erosive changes along the undersurfaces of the posterior portion of the left eighth and ninth ribs cannot be excluded". Patient does have ptp in this area.  Distribution and reproducible ptp not consistent with neuropathic pain. PSA normal, low suspicion for prostate cancer causing lytic rib lesions. Does have extensive smoking history, could be a bony met from an undiagnosed lung malignancy. Do  not suspect an inflammatory arthritis to affect just the ribs. No hypercalcemia to suggest hyper parathyroidism, no elevated calcium, renal dysfunction, gamma gap to suggest myeloma. Do need to rule out primary bone neoplasm. Will get CT to better characterize ribs and to evaluate the lungs.  -tylenol 1000 mg TID -voltaren gel -NO oral/IV NSAIDs given cardiac disease and diabetic kidney disease -CT chest -consider opioid pain meds if no relief of pain      Relevant Medications   diclofenac Sodium (VOLTAREN) 1 % GEL   Other Relevant Orders   CT Chest Wo Contrast   Other Visit Diagnoses     Positive for macroalbuminuria    -  Primary   Relevant Medications   losartan (COZAAR) 50 MG tablet   Other Relevant Orders   CMP14 + Anion Gap (Completed)   Lytic bone lesions on xray       Relevant Orders   PSA (Completed)   CT Chest Wo Contrast       Return in about 2 weeks (around 10/19/2022).    Iona Coach, MD

## 2022-10-06 NOTE — Assessment & Plan Note (Signed)
BP elevated to 141/66 in clinic. Currently on metop, imdur and lasix for HFpEF. Given diabetic kidney disease with microalbumin:Cr of around 2500, will start ARB. CMP done and renal function and electrolytes wnl. -losartan 57m daily

## 2022-10-06 NOTE — Assessment & Plan Note (Signed)
>>  ASSESSMENT AND PLAN FOR DIABETIC KIDNEY DISEASE (HCC) WRITTEN ON 10/06/2022 10:29 AM BY SHARL GEE, MD  eGFR normal at 98 but does have microalbumin:Cr of 2500 consistent with diabetic kidney disease. Started on ARB today. 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 Will recheck microalbumin: Cr level at next visit. Can titrate losartan  to max tolerated, if persistent albuminuria could consider finerenone. -losartan  50mg  daily, titrate to max tolerated dose -f/u urine microalbumin at next visit -nephro referral if persistent severe range albuminuria

## 2022-10-07 ENCOUNTER — Encounter: Payer: Self-pay | Admitting: Podiatry

## 2022-10-07 NOTE — Progress Notes (Addendum)
Internal Medicine Clinic Attending  Case discussed with Dr. Stann Mainland  At the time of the visit.  We reviewed the resident's history and exam and pertinent patient test results.  I agree with the assessment, diagnosis, and plan of care documented in the resident's note. At f/u, would check UA and SPEPw/IF, FLC, Igs with repeat urine ACR to further evaluate proteinuria.

## 2022-10-11 ENCOUNTER — Telehealth: Payer: Self-pay

## 2022-10-11 NOTE — Telephone Encounter (Signed)
Patient called he stated he is still having pain and wants to know plan of care please return patients call.

## 2022-10-12 ENCOUNTER — Ambulatory Visit (HOSPITAL_COMMUNITY)
Admission: RE | Admit: 2022-10-12 | Discharge: 2022-10-12 | Disposition: A | Discharge status: Home / Self Care | Payer: Medicare Other | Source: Ambulatory Visit | Attending: Internal Medicine | Admitting: Internal Medicine

## 2022-10-12 ENCOUNTER — Encounter (HOSPITAL_COMMUNITY): Payer: Self-pay

## 2022-10-12 DIAGNOSIS — R0789 Other chest pain: Secondary | ICD-10-CM | POA: Diagnosis present

## 2022-10-12 DIAGNOSIS — M899 Disorder of bone, unspecified: Secondary | ICD-10-CM | POA: Diagnosis present

## 2022-10-12 NOTE — Progress Notes (Signed)
Called patient to discuss findings on CT. No bony erosions/destruction seen on CT as was initially suggested on U/S. Cutaneous L axillary lesion on CT is described by patient as a cyst that he had removed years ago, denies any current cutaneous lesion. Will evaluate this area at follow up 3/5. Patient still having pain described as stabbing, did not correlate to a nerve root distribution though. 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 Will order f/u thyroid ultrasound.

## 2022-10-19 ENCOUNTER — Ambulatory Visit: Payer: Medicaid Other

## 2022-10-19 VITALS — BP 107/67 | HR 102 | Temp 98.3°F | Ht 71.0 in | Wt 213.2 lb

## 2022-10-19 DIAGNOSIS — E041 Nontoxic single thyroid nodule: Secondary | ICD-10-CM

## 2022-10-19 DIAGNOSIS — R3129 Other microscopic hematuria: Secondary | ICD-10-CM

## 2022-10-19 DIAGNOSIS — G8929 Other chronic pain: Secondary | ICD-10-CM | POA: Diagnosis not present

## 2022-10-19 DIAGNOSIS — E1169 Type 2 diabetes mellitus with other specified complication: Secondary | ICD-10-CM

## 2022-10-19 DIAGNOSIS — Z794 Long term (current) use of insulin: Secondary | ICD-10-CM | POA: Diagnosis not present

## 2022-10-19 DIAGNOSIS — E1165 Type 2 diabetes mellitus with hyperglycemia: Secondary | ICD-10-CM

## 2022-10-19 DIAGNOSIS — R801 Persistent proteinuria, unspecified: Secondary | ICD-10-CM

## 2022-10-19 DIAGNOSIS — I10 Essential (primary) hypertension: Secondary | ICD-10-CM

## 2022-10-19 DIAGNOSIS — M5414 Radiculopathy, thoracic region: Secondary | ICD-10-CM

## 2022-10-19 DIAGNOSIS — R0789 Other chest pain: Secondary | ICD-10-CM | POA: Diagnosis not present

## 2022-10-19 DIAGNOSIS — E785 Hyperlipidemia, unspecified: Secondary | ICD-10-CM | POA: Diagnosis not present

## 2022-10-19 DIAGNOSIS — E1121 Type 2 diabetes mellitus with diabetic nephropathy: Secondary | ICD-10-CM

## 2022-10-19 MED ORDER — DULOXETINE HCL 30 MG PO CPEP: 30.0000 mg | ORAL_CAPSULE | Freq: Every day | ORAL | 2 refills | Status: DC

## 2022-10-19 NOTE — Progress Notes (Unsigned)
BP elevated to 141/66 in clinic. Currently on metop, imdur and lasix for HFpEF. Given diabetic kidney disease with microalbumin:Cr of around 2500, will start ARB. CMP done and renal function and electrolytes wnl. -losartan '50mg'$  daily  Diabetic kidney disease eGFR normal at 98 but does have microalbumin:Cr of 2500 07/2022 consistent with diabetic kidney disease. Started on ARB today. 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 Will recheck microalbumin: Cr level at next visit. Can titrate losartan to max tolerated, if persistent albuminuria could consider finerenone. -losartan '50mg'$  daily, titrate to max tolerated dose -f/u urine microalbumin at next visit -nephro referral if persistent severe range albuminuria  Uncontrolled T2DM with long term insulin use Most recent A1c 12.7% 09/08/22. Recently seen in clinic for insulin titration to 45u long acting nightly and 10u BID short acting for his two meals per day. Reports eating breakfast 9-10am and dinner 5-7 PM. Patient unable to tolerate 45 or 40 u long acting. Getting low BG to 30-40s on these doses. Appears to be getting lows around 6AM. Says on 35u(prior dose with uncontrolled sugars), BG only as low as 100-110. Also reports taking 11u with meals instead of 10. No snack insulin coverage.  On CGM 42% very high,27% high, 31% in range, 0% low, <1% very low. Appears after breakfast BG remains elevated for remainder of day. Avg was 229. Hard to determin the pattern overall given 3 different insulin dosing regimens. No change today, will reevaluate in 2 weeks. Not sure that metformin is providing benefit to this patient as he has diabetes form pancreatitis, could consider stopping. -35u glargine nightly -10u meal time insulin -metformin 1000 BID(consider discontinuation, see note above)  Thoracic chest pain Continues to have L sided chest pain. Starts out feeling like he is punched over the pectoral muscle. Numbness then radiates from peck down lateral  chest wall until around the area of the 12ths rib. Throbs after this. Happens once every 30-60 minutes. Tylenol not helping much. Took some left over oxy 5 mg x2 during the night time and helped with the pain, but does endorse it made him fall asleep which may be the reason. Xray showed "Erosive changes along the undersurfaces of the posterior portion of the left eighth and ninth ribs cannot be excluded". Patient does have ptp in this area. Distribution and reproducible ptp not consistent with neuropathic pain. PSA normal, low suspicion for prostate cancer causing lytic rib lesions. Does have extensive smoking history, could be a bony met from an undiagnosed lung malignancy. Do not suspect an inflammatory arthritis to affect just the ribs. No hypercalcemia to suggest hyper parathyroidism, no elevated calcium, renal dysfunction, gamma gap to suggest myeloma. Do need to rule out primary bone neoplasm. Will get CT to better characterize ribs and to evaluate the lungs.  -tylenol 1000 mg TID -voltaren gel -NO oral/IV NSAIDs given cardiac disease and diabetic kidney disease -CT chest -consider opioid pain meds if no relief of pain  At f/u, would check UA and SPEPw/IF, FLC, Igs with repeat urine ACR to further evaluate proteinuria.   Colonoscopy canceled due to A1c who needs colonoscopy prior to ostomy takedown, as well as repeat upper endoscopy to assess healing of esophageal ulcers and exclude esophageal dysplasia   Thyroid ultrasound, TSH= incidental thyroid nodule  HCM optho

## 2022-10-19 NOTE — Patient Instructions (Addendum)
Thank you, [name] for allowing [name] to provide your care today. Today we discussed :  Thoracic nerve pain: We will get an MRI of the thoracic spine to evaluate the nerve roots coming out of the spine. We will start a nerve pain medication called cymbalta.  Urine protein: We are getting labs on your urine (urinalysis and urine microalbumin) as well as your blood(immunoglobulins, protein electrophoresis) to evaluate this. It is likely diabetes but we need to rule out other reasons.  Diabetes: Please go up to 38u nightly on your insulin.  Thyroid nodule: We will get an ultrasound and test your thyroid hormone level.  I have ordered the following labs for you:   Lab Orders         TSH         Urinalysis, Reflex Microscopic         Multiple Myeloma Panel (SPEP&IFE w/QIG)         Microalbumin / Creatinine Urine Ratio        Referrals ordered today:   Referral Orders  No referral(s) requested today     I have ordered the following medication/changed the following medications:   Stop the following medications: There are no discontinued medications.   Start the following medications: Meds ordered this encounter  Medications   DULoxetine (CYMBALTA) 30 MG capsule    Sig: Take 1 capsule (30 mg total) by mouth daily.    Dispense:  30 capsule    Refill:  2     Follow up:  2 weeks      We look forward to seeing you next time. Please call our clinic at [phone] if you have any questions or concerns. The best time to call is Monday-Friday from 9am-4pm, but there is someone available 24/7. If after hours or the weekend, call the main hospital number and ask for the Internal Medicine Resident On-Call. If you need medication refills, please notify your pharmacy one week in advance and they will send [name] a request.   Thank you for trusting me with your care. Wishing you the best!   Iona Coach, MD [place]

## 2022-10-20 DIAGNOSIS — E041 Nontoxic single thyroid nodule: Secondary | ICD-10-CM | POA: Insufficient documentation

## 2022-10-20 NOTE — Assessment & Plan Note (Signed)
>>  ASSESSMENT AND PLAN FOR DIABETIC KIDNEY DISEASE (HCC) WRITTEN ON 10/25/2022 11:39 AM BY SHARL GEE, MD  microalbumin:Cr of 2500 07/2022 consistent with severe albuminuria and diabetic kidney disease. Will repeat microalbumin today. Will also get UA, SPEP, IG, light chains to evaluate high urinary protein. Patient currently on losartan  50mg  and BP cannot tolerate higher dose. Not a candidate for SGLT2 given prior Fournier gangrene. -losartan  50mg  daily -f/u urine microalbumin,UA, SPEP,light chains, IGs -nephro referral if persistent severe range albuminuria  Addendum: Patient with worsening microalbumin to creatinine ratio of >5K. Myeloma panel negative. Renal function okay with eGFR 70. UA is showing evidence of microscopic hematuria but no casts or dysmorphic cells to suggest glomerular bleeding. Prior CT abdomen 04/2022 with R kidney simple cyst, could be hemorrhagic. No stones during that study. No leukocytes or urinary symptoms to suggest infection. Could consider CT urography given smoking history if persistent hematuria on UA. No volume overload on exam and albumin  >3 so not consistent with nephrotic syndrome. Triglycerides elevated to 197 a year ago, would repeat.Would get protein: creatinine and refer to nephrology. Patient stopped losartan , gave him leg jitters. Will need to address at follow up.

## 2022-10-20 NOTE — Progress Notes (Signed)
Internal Medicine Clinic Attending  I saw and evaluated the patient.  I personally confirmed the key portions of the history and exam documented by Dr. Stann Mainland and I reviewed pertinent patient test results.  The assessment, diagnosis, and plan were formulated together and I agree with the documentation in the resident's note.  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  Agree with symptomatic management at this time.

## 2022-10-20 NOTE — Assessment & Plan Note (Addendum)
microalbumin:Cr of 2500 07/2022 consistent with severe albuminuria and diabetic kidney disease. Will repeat microalbumin today. Will also get UA, SPEP, IG, light chains to evaluate high urinary protein. Patient currently on losartan 50mg  and BP cannot tolerate higher dose. Not a candidate for SGLT2 given prior Fournier gangrene. -losartan 50mg  daily -f/u urine microalbumin,UA, SPEP,light chains, IGs -nephro referral if persistent severe range albuminuria  Addendum: Patient with worsening microalbumin to creatinine ratio of >5K. Myeloma panel negative. Renal function okay with eGFR 70. UA is showing evidence of microscopic hematuria but no casts or dysmorphic cells to suggest glomerular bleeding. Prior CT abdomen 04/2022 with R kidney simple cyst, could be hemorrhagic. No stones during that study. No leukocytes or urinary symptoms to suggest infection. Could consider CT urography given smoking history if persistent hematuria on UA. No volume overload on exam and albumin >3 so not consistent with nephrotic syndrome. Triglycerides elevated to 197 a year ago, would repeat.Would get protein: creatinine and refer to nephrology. Patient stopped losartan, gave him leg jitters. Will need to address at follow up.

## 2022-10-20 NOTE — Assessment & Plan Note (Signed)
Patient returning for 2 week follow up of diabetes and CGM eval. Prior cgm showing 42% very high,27% high, 31% in range, 0% low, <1% very low, now worsened to 62% very high, 21% high, 16% in range. Did report one low this AM in the 24s with diaphoresis, nausea upon wakening. Tends to get lows in AM when they occur. Average glucose 279. Will continue 11u meal time insulin and will increase glargine from 35 to 38u daily. Patient has had multiple lows on prior regimens of 45 and 40u glargine daily. Will titrate this slowly. May need to add sliding scale insulin and carb counting for meal coverage for titer glycemic control with this patient. Correction factor would be 1800/68=26.47. Thus he would use 1u short acting to lower blood glucose 25u. He would use 1u for every 25 blood glucose over a goal of 150 for pre-meal correction for example. Can discuss this in the future, for now continue scheduled meal time and increase long acting.Not sure that metformin is providing benefit to this patient as he has diabetes form pancreatitis, could consider stopping. -38u glargine nightly -10u meal time insulin -metformin 1000 BID(consider discontinuation, see note above)

## 2022-10-20 NOTE — Assessment & Plan Note (Signed)
Patient here for 2 week follow up and continues to endorse L chest wall pain(mid axillary to midclavicular line, mid pectoral height to upper portion of LUQ) that starts out feeling like he is punched with throbbing, then transitions to some numbness and tingling. This is now getting more frequent and is less tolerable.Sometimes comes from the back. Lasts for 1 hour at the shortest and 5 hours at the longest. Some associated disesthesia as the patient says laying his arm lightly over the area provokes pain. Not provoked by movement. Happens randomly while sitting, laying, in the shower, while active and at rest, no known provoking factor. Not improved by tylenol. Did not try the voltaren gel. Not a candidate for NSAIDS given diabetic kidney disease with nephrotic range proteinuria. CT chest with no bony lesions, no masses, no pulmonary pathology, no upper abdominal pathology. On exam some reproducible ptp, no rashes or overlying skin changes, no provoked with use of the oblique muscles.Given the tingling, numbness and disesthesia do suspect this is neuropathic pain, will start cymbalta. Unclear whether this is radiculopathy or potentially a diabetic thoracic neuropathy. Will obtain thoracic MRI. -cymbalta '30mg'$  daily -f/u thoracic MRI

## 2022-10-20 NOTE — Assessment & Plan Note (Signed)
CT chest recently done and 1.6 cm right thyroid nodule noted incidentally. -F/u TSH -f/u thyroid ultrasound

## 2022-10-20 NOTE — Progress Notes (Signed)
Established Patient Office Visit  Subjective   Patient ID: Andres Richardson, male    DOB: [birth year]  Age: 57 y.o. MRN: [id number]  Chief Complaint  Patient presents with   Pain    Side pian     [name] is a 57 y/o male with a pmh outlined below. Please see A&P for HPI information      Review of Systems  All other systems reviewed and are negative.     Objective:     BP 107/67 (BP Location: Left Arm, Patient Position: Sitting, Cuff Size: Normal)   Pulse (!) 102   Temp 98.3 F (36.8 C) (Oral)   Ht '5\' 11"'$  (1.803 m)   Wt 213 lb 3.2 oz (96.7 kg)   SpO2 100%   BMI 29.74 kg/m    Physical Exam Constitutional:      General: He is not in acute distress.    Appearance: Normal appearance. He is obese.     Comments: Sitting in wheelchair  Eyes:     General: No scleral icterus.    Conjunctiva/sclera: Conjunctivae normal.  Cardiovascular:     Rate and Rhythm: Normal rate and regular rhythm.     Pulses: Normal pulses.     Heart sounds: Normal heart sounds. No murmur heard.    No gallop.  Pulmonary:     Effort: Pulmonary effort is normal. No respiratory distress.     Breath sounds: Normal breath sounds. No wheezing or rales.  Abdominal:     General: Abdomen is flat. There is no distension.     Palpations: Abdomen is soft. There is no mass.     Tenderness: There is no abdominal tenderness. There is no guarding or rebound.     Hernia: No hernia is present.     Comments: Ostomy in place  Musculoskeletal:     Right lower leg: No edema.     Left lower leg: No edema.     Comments: No bony deformity, palpable masses of the L chest wall. No pain on L or R lateral flexion or rotation of the abdomen. Minimal ptp over the L chest wall. Some ptp over the thoracic bony spine and paraspinal muscles on the L.  Skin:    General: Skin is warm and dry.     Capillary Refill: Capillary refill takes less than 2 seconds.     Comments: No rashes or skin changes overlying the left  anterior/lateral chest wall or L abdomen  Neurological:     Mental Status: He is alert.      Results for orders placed or performed in visit on 10/19/22  Microscopic Examination   Urine  Result Value Ref Range   WBC, UA None seen 0 - 5 /hpf   RBC, Urine 3-10 (A) 0 - 2 /hpf   Epithelial Cells (non renal) None seen 0 - 10 /hpf   Casts None seen None seen /lpf   Bacteria, UA None seen None seen/Few  TSH  Result Value Ref Range   TSH WILL FOLLOW   Urinalysis, Reflex Microscopic  Result Value Ref Range   Specific Gravity, UA 1.021 1.005 - 1.030   pH, UA 7.0 5.0 - 7.5   Color, UA Yellow Yellow   Appearance Ur Clear Clear   Leukocytes,UA Negative Negative   Protein,UA 4+ (A) Negative/Trace   Glucose, UA 3+ (A) Negative   Ketones, UA Negative Negative   RBC, UA 1+ (A) Negative   Bilirubin, UA Negative Negative  Urobilinogen, Ur 0.2 0.2 - 1.0 mg/dL   Nitrite, UA Negative Negative   Microscopic Examination See below:   Multiple Myeloma Panel (SPEP&IFE w/QIG)  Result Value Ref Range   IgG (Immunoglobin G), Serum WILL FOLLOW    IgA/Immunoglobulin A, Serum WILL FOLLOW    IgM (Immunoglobulin M), Srm WILL FOLLOW    Total Protein WILL FOLLOW    Albumin SerPl Elph-Mcnc WILL FOLLOW    Alpha 1 WILL FOLLOW    Alpha2 Glob SerPl Elph-Mcnc WILL FOLLOW    B-Globulin SerPl Elph-Mcnc WILL FOLLOW    Gamma Glob SerPl Elph-Mcnc WILL FOLLOW    M Protein SerPl Elph-Mcnc WILL FOLLOW    Globulin, Total WILL FOLLOW    Albumin/Glob SerPl WILL FOLLOW    IFE 1 WILL FOLLOW    Please Note WILL FOLLOW   Microalbumin / Creatinine Urine Ratio  Result Value Ref Range   Creatinine, Urine WILL FOLLOW    Microalbumin, Urine WILL FOLLOW    Microalb/Creat Ratio WILL FOLLOW   BMP8+Anion Gap  Result Value Ref Range   Glucose WILL FOLLOW    BUN WILL FOLLOW    Creatinine, Ser WILL FOLLOW    eGFR WILL FOLLOW    BUN/Creatinine Ratio WILL FOLLOW    Sodium WILL FOLLOW    Potassium WILL FOLLOW    Chloride  WILL FOLLOW    CO2 WILL FOLLOW    Anion Gap WILL FOLLOW    Calcium WILL FOLLOW       The ASCVD Risk score (Arnett DK, et al., 2019) failed to calculate for the following reasons:   The valid total cholesterol range is 130 to 320 mg/dL    Assessment & Plan:   Problem List Items Addressed This Visit       Cardiovascular and Mediastinum   Hypertension   Relevant Orders   BMP8+Anion Gap (Completed)     Endocrine   Uncontrolled type 2 diabetes mellitus with hyperglycemia, with long-term current use of insulin (HCC)    Patient returning for 2 week follow up of diabetes and CGM eval. Prior cgm showing 42% very high,27% high, 31% in range, 0% low, <1% very low, now worsened to 62% very high, 21% high, 16% in range. Did report one low this AM in the 67s with diaphoresis, nausea upon wakening. Tends to get lows in AM when they occur. Average glucose 279. Will continue 11u meal time insulin and will increase glargine from 35 to 38u daily. Patient has had multiple lows on prior regimens of 45 and 40u glargine daily. Will titrate this slowly. May need to add sliding scale insulin and carb counting for meal coverage for titer glycemic control with this patient. Correction factor would be 1800/68=26.47. Thus he would use 1u short acting to lower blood glucose 25u. He would use 1u for every 25 blood glucose over a goal of 150 for pre-meal correction for example. Can discuss this in the future, for now continue scheduled meal time and increase long acting.Not sure that metformin is providing benefit to this patient as he has diabetes form pancreatitis, could consider stopping. -38u glargine nightly -10u meal time insulin -metformin 1000 BID(consider discontinuation, see note above)      Diabetic kidney disease ([place])    microalbumin:Cr of 2500 07/2022 consistent with severe albuminuria and diabetic kidney disease. Will repeat microalbumin today. Will also get UA, SPEP, IG, light chains to evaluate high  urinary protein. Patient currently on losartan '50mg'$  and BP cannot tolerate higher dose. Not a  candidate for SGLT2 given prior Fournier gangrene. -losartan '50mg'$  daily -f/u urine microalbumin,UA, SPEP,light chains, IGs -nephro referral if persistent severe range albuminuria      Thyroid nodule greater than or equal to 1.5 cm in diameter incidentally noted on imaging study    CT chest recently done and 1.6 cm right thyroid nodule noted incidentally. -F/u TSH -f/u thyroid ultrasound      Relevant Orders   TSH (Completed)   US THYROID     Other   Chest wall pain, chronic    Patient here for 2 week follow up and continues to endorse L chest wall pain(mid axillary to midclavicular line, mid pectoral height to upper portion of LUQ) that starts out feeling like he is punched with throbbing, then transitions to some numbness and tingling. This is now getting more frequent and is less tolerable.Sometimes comes from the back. Lasts for 1 hour at the shortest and 5 hours at the longest. Some associated disesthesia as the patient says laying his arm lightly over the area provokes pain. Not provoked by movement. Happens randomly while sitting, laying, in the shower, while active and at rest, no known provoking factor. Not improved by tylenol. Did not try the voltaren gel. Not a candidate for NSAIDS given diabetic kidney disease with nephrotic range proteinuria. CT chest with no bony lesions, no masses, no pulmonary pathology, no upper abdominal pathology. On exam some reproducible ptp, no rashes or overlying skin changes, no provoked with use of the oblique muscles.Given the tingling, numbness and disesthesia do suspect this is neuropathic pain, will start cymbalta. Unclear whether this is radiculopathy or potentially a diabetic thoracic neuropathy. Will obtain thoracic MRI. -cymbalta '30mg'$  daily -f/u thoracic MRI      Relevant Medications   DULoxetine (CYMBALTA) 30 MG capsule   Other Visit Diagnoses      Thoracic radiculopathy    -  Primary   Relevant Medications   DULoxetine (CYMBALTA) 30 MG capsule   Other Relevant Orders   MR Thoracic Spine Wo Contrast   Persistent proteinuria       Relevant Orders   Urinalysis, Reflex Microscopic (Completed)   Multiple Myeloma Panel (SPEP&IFE w/QIG) (Completed)   Microalbumin / Creatinine Urine Ratio (Completed)   Microscopic Examination (Completed)       Return in about 2 weeks (around 11/02/2022).    Iona Coach, MD

## 2022-10-21 ENCOUNTER — Ambulatory Visit: Payer: Medicaid Other | Admitting: Podiatry

## 2022-10-21 ENCOUNTER — Ambulatory Visit (INDEPENDENT_AMBULATORY_CARE_PROVIDER_SITE_OTHER): Payer: Medicaid Other | Admitting: Podiatry

## 2022-10-21 DIAGNOSIS — L97512 Non-pressure chronic ulcer of other part of right foot with fat layer exposed: Secondary | ICD-10-CM

## 2022-10-21 LAB — MICROSCOPIC EXAMINATION
Bacteria, UA: NONE SEEN
Casts: NONE SEEN /lpf
Epithelial Cells (non renal): NONE SEEN /hpf (ref 0–10)
WBC, UA: NONE SEEN /hpf (ref 0–5)

## 2022-10-21 LAB — MICROALBUMIN / CREATININE URINE RATIO
Creatinine, Urine: 55.3 mg/dL
Microalb/Creat Ratio: 5838 mg/g creat — ABNORMAL HIGH (ref 0–29)
Microalbumin, Urine: 3228.5 ug/mL

## 2022-10-21 LAB — URINALYSIS, ROUTINE W REFLEX MICROSCOPIC
Bilirubin, UA: NEGATIVE
Ketones, UA: NEGATIVE
Leukocytes,UA: NEGATIVE
Nitrite, UA: NEGATIVE
Specific Gravity, UA: 1.021 (ref 1.005–1.030)
Urobilinogen, Ur: 0.2 mg/dL (ref 0.2–1.0)
pH, UA: 7 (ref 5.0–7.5)

## 2022-10-22 LAB — TSH: TSH: 2.19 u[IU]/mL (ref 0.450–4.500)

## 2022-10-22 LAB — BMP8+ANION GAP
Anion Gap: 20 mmol/L — ABNORMAL HIGH (ref 10.0–18.0)
BUN/Creatinine Ratio: 15 (ref 9–20)
BUN: 18 mg/dL (ref 6–24)
CO2: 20 mmol/L (ref 20–29)
Calcium: 8.1 mg/dL — ABNORMAL LOW (ref 8.7–10.2)
Chloride: 105 mmol/L (ref 96–106)
Creatinine, Ser: 1.21 mg/dL (ref 0.76–1.27)
Glucose: 347 mg/dL — ABNORMAL HIGH (ref 70–99)
Potassium: 4.5 mmol/L (ref 3.5–5.2)
Sodium: 145 mmol/L — ABNORMAL HIGH (ref 134–144)
eGFR: 70 mL/min/{1.73_m2} (ref >59)

## 2022-10-22 LAB — MULTIPLE MYELOMA PANEL, SERUM
Albumin SerPl Elph-Mcnc: 2.9 g/dL (ref 2.9–4.4)
Albumin/Glob SerPl: 1 (ref 0.7–1.7)
Alpha 1: 0.3 g/dL (ref 0.0–0.4)
Alpha2 Glob SerPl Elph-Mcnc: 1 g/dL (ref 0.4–1.0)
B-Globulin SerPl Elph-Mcnc: 0.9 g/dL (ref 0.7–1.3)
Gamma Glob SerPl Elph-Mcnc: 0.9 g/dL (ref 0.4–1.8)
Globulin, Total: 3.1 g/dL (ref 2.2–3.9)
IgA/Immunoglobulin A, Serum: 241 mg/dL (ref 90–386)
IgG (Immunoglobin G), Serum: 981 mg/dL (ref 603–1613)
IgM (Immunoglobulin M), Srm: 49 mg/dL (ref 20–172)
Total Protein: 6 g/dL (ref 6.0–8.5)

## 2022-10-25 NOTE — Addendum Note (Signed)
Addended by: Iona Coach on: 10/25/2022 11:41 AM   Modules accepted: Orders

## 2022-10-25 NOTE — Progress Notes (Signed)
  Subjective:  Patient ID: Andres Richardson, male    DOB: 1966/01/26,  MRN: [id number]  Chief Complaint  Patient presents with   Foot Ulcer    right    57 y.o. male presents with the above complaint. History confirmed with patient.  He said it is doing okay he has been using the gentamicin ointment  Objective:  Physical Exam: Weakly palpable pedal pulses foot is fairly warm and perfused well, the ulceration has returned deep to the hyperkeratotic tissue measuring 0.3 x 0.5 x 0.3 cm.  No purulence drainage or evidence of infection      Assessment:   1. Ulcer of right foot with fat layer exposed ([place])      Plan:  Patient was evaluated and treated and all questions answered.   Ulcer right foot -Doing much better.  Very little drainage and improved dimensions today.  Continue use gentamicin ointment.  Refill sent to pharmacy.  Full-thickness excisional debridement was completed with a sharp scalpel to the subcutaneous layer to remove all nonviable tissue, hyperkeratosis and slough.  Postdebridement measurements noted above.  Return in 3 weeks for ongoing wound care  Return in about 3 weeks (around 11/11/2022) for wound care.

## 2022-10-25 NOTE — Progress Notes (Signed)
Patient with worsening microalbumin to creatinine ratio of >5K. Myeloma panel negative. Renal function okay with eGFR 70. UA is showing evidence of microscopic hematuria but no casts or dysmorphic cells to suggest glomerular bleeding. Prior CT abdomen 04/2022 with R kidney simple cyst, could be hemorrhagic. No stones during that study. No leukocytes or urinary symptoms to suggest infection. Could consider CT urography given smoking history if persistent hematuria on UA. No volume overload on exam and albumin >3 so not consistent with nephrotic syndrome. Triglycerides elevated to 197 a year ago, would repeat.Would get protein: creatinine and refer to nephrology. Patient stopped losartan, gave him leg jitters. Will need to address at follow up.

## 2022-10-28 ENCOUNTER — Ambulatory Visit: Payer: Medicaid Other | Attending: Internal Medicine | Admitting: Internal Medicine

## 2022-10-28 ENCOUNTER — Encounter: Payer: Self-pay | Admitting: Internal Medicine

## 2022-10-28 VITALS — BP 120/60 | HR 107 | Ht 71.0 in | Wt 209.0 lb

## 2022-10-28 DIAGNOSIS — I2582 Chronic total occlusion of coronary artery: Secondary | ICD-10-CM

## 2022-10-28 DIAGNOSIS — R609 Edema, unspecified: Secondary | ICD-10-CM

## 2022-10-28 DIAGNOSIS — I25119 Atherosclerotic heart disease of native coronary artery with unspecified angina pectoris: Secondary | ICD-10-CM

## 2022-10-28 DIAGNOSIS — I5033 Acute on chronic diastolic (congestive) heart failure: Secondary | ICD-10-CM | POA: Diagnosis not present

## 2022-10-28 DIAGNOSIS — E7800 Pure hypercholesterolemia, unspecified: Secondary | ICD-10-CM | POA: Diagnosis not present

## 2022-10-28 DIAGNOSIS — I739 Peripheral vascular disease, unspecified: Secondary | ICD-10-CM

## 2022-10-28 DIAGNOSIS — G4733 Obstructive sleep apnea (adult) (pediatric): Secondary | ICD-10-CM | POA: Diagnosis not present

## 2022-10-28 DIAGNOSIS — Z794 Long term (current) use of insulin: Secondary | ICD-10-CM

## 2022-10-28 DIAGNOSIS — I10 Essential (primary) hypertension: Secondary | ICD-10-CM | POA: Diagnosis not present

## 2022-10-28 DIAGNOSIS — R809 Proteinuria, unspecified: Secondary | ICD-10-CM

## 2022-10-28 DIAGNOSIS — E119 Type 2 diabetes mellitus without complications: Secondary | ICD-10-CM
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
## 2022-10-28 NOTE — Patient Instructions (Signed)
Medication Instructions:  No Changes In Medications at this time.  *If you need a refill on your cardiac medications before your next appointment, please call your pharmacy*  Follow-Up: At [facility], you and your health needs are our priority.  As part of our continuing mission to provide you with exceptional heart care, we have created designated Provider Care Teams.  These Care Teams include your primary Cardiologist (physician) and Advanced Practice Providers (APPs -  Physician Assistants and Nurse Practitioners) who all work together to provide you with the care you need, when you need it.  Your next appointment:   6 month(s)  Provider:   Gayatri A Acharya, MD    

## 2022-11-01 ENCOUNTER — Telehealth: Payer: Self-pay | Admitting: Internal Medicine

## 2022-11-01 NOTE — Telephone Encounter (Signed)
Open in error

## 2022-11-02 ENCOUNTER — Ambulatory Visit (INDEPENDENT_AMBULATORY_CARE_PROVIDER_SITE_OTHER): Payer: Medicaid Other | Admitting: Student

## 2022-11-02 ENCOUNTER — Encounter: Payer: Self-pay | Admitting: Dietician

## 2022-11-02 ENCOUNTER — Other Ambulatory Visit: Payer: Self-pay

## 2022-11-02 ENCOUNTER — Encounter: Payer: Self-pay | Admitting: Student

## 2022-11-02 ENCOUNTER — Ambulatory Visit: Payer: Medicaid Other | Admitting: Dietician

## 2022-11-02 VITALS — Wt 207.3 lb

## 2022-11-02 VITALS — BP 116/61 | HR 99 | Temp 97.9°F | Resp 24 | Ht 71.0 in | Wt 207.3 lb

## 2022-11-02 DIAGNOSIS — E1121 Type 2 diabetes mellitus with diabetic nephropathy: Secondary | ICD-10-CM

## 2022-11-02 DIAGNOSIS — R0789 Other chest pain: Secondary | ICD-10-CM

## 2022-11-02 DIAGNOSIS — Z794 Long term (current) use of insulin: Secondary | ICD-10-CM

## 2022-11-02 DIAGNOSIS — E041 Nontoxic single thyroid nodule: Secondary | ICD-10-CM | POA: Diagnosis not present

## 2022-11-02 DIAGNOSIS — R809 Proteinuria, unspecified: Secondary | ICD-10-CM

## 2022-11-02 DIAGNOSIS — I5032 Chronic diastolic (congestive) heart failure: Secondary | ICD-10-CM | POA: Diagnosis not present

## 2022-11-02 DIAGNOSIS — E1165 Type 2 diabetes mellitus with hyperglycemia: Secondary | ICD-10-CM | POA: Diagnosis not present

## 2022-11-02 DIAGNOSIS — I110 Hypertensive heart disease with heart failure: Secondary | ICD-10-CM | POA: Diagnosis not present

## 2022-11-02 DIAGNOSIS — M5414 Radiculopathy, thoracic region: Secondary | ICD-10-CM | POA: Diagnosis not present

## 2022-11-02 DIAGNOSIS — Z7984 Long term (current) use of oral hypoglycemic drugs: Secondary | ICD-10-CM

## 2022-11-02 DIAGNOSIS — I10 Essential (primary) hypertension: Secondary | ICD-10-CM

## 2022-11-02 DIAGNOSIS — G8929 Other chronic pain: Secondary | ICD-10-CM | POA: Diagnosis not present

## 2022-11-02 MED ORDER — LOSARTAN POTASSIUM 25 MG PO TABS: 12.5000 mg | ORAL_TABLET | Freq: Every day | ORAL | 0 refills | Status: DC

## 2022-11-02 MED ORDER — DULOXETINE HCL 60 MG PO CPEP: 60.0000 mg | ORAL_CAPSULE | Freq: Every day | ORAL | 2 refills | Status: DC

## 2022-11-02 MED ORDER — INSULIN GLARGINE 100 UNIT/ML SOLOSTAR PEN: 42.0000 [IU] | PEN_INJECTOR | Freq: Every day | SUBCUTANEOUS | 11 refills | Status: DC

## 2022-11-02 NOTE — Progress Notes (Signed)
Diabetes Self-Management Education  Visit Type:  Follow-up (#3 after initial)  Appt. Start Time: 1315 Appt. End Time: 1400  11/02/2022  [name], identified by name and date of birth, is a 57 y.o. male with a diagnosis of Diabetes:  .   ASSESSMENT  Patient initially stated that overnight his blood sugar "crashes" to below 70 mg/dl. No evidence of this was observed on his CGM report and  tis was shared with him. He also states that her forgot to take Lantus 38  last night and this happens seldom. He takes 11 units Humalog before meals twice daily before meals at 9-10 AM and 6-7 PM, also eats a snack 11 PM - 2 AM but only takes the lantus at this time. Discussed and gave CGM report to Dr. Jodi Mourning.   Weight 207 lb 4.8 oz (94 kg). Body mass index is 28.91 kg/m. Wt Readings from Last 10 Encounters:  11/02/22 207 lb 4.8 oz (94 kg)  11/02/22 207 lb 4.8 oz (94 kg)  10/28/22 209 lb (94.8 kg)  10/19/22 213 lb 3.2 oz (96.7 kg)  10/05/22 211 lb 9.6 oz (96 kg)  09/21/22 210 lb 3.2 oz (95.3 kg)  09/21/22 210 lb 3.2 oz (95.3 kg)  09/08/22 204 lb 3.2 oz (92.6 kg)  08/30/22 210 lb (95.3 kg)  07/29/22 203 lb 6.4 oz (92.3 kg)   Lab Results  Component Value Date   HGBA1C 12.7 (A) 09/08/2022   HGBA1C 12.9 (A) 06/02/2022   HGBA1C 12.6 (H) 12/02/2021   HGBA1C 6.8 (H) 05/13/2021   HGBA1C 9.5 (H) 02/22/2021        Diabetes Self-Management Education - 11/02/22 1600       Health Coping   How would you rate your overall health? Good      Pre-Education Assessment   Patient understands incorporating nutritional management into lifestyle. Needs Review   he wants to know about lower carb snack choices     Dietary Intake   Snack (evening) 11pm- 4am is snack with 12 saltines and peanuit butter and grapeses    Beverage(s) mostly water      Activity / Exercise   Activity / Exercise Type ADL's    How many days per week do you exercise? 7    How many minutes per day do you exercise? 15     Total minutes per week of exercise 105      Patient Education   Previous Diabetes Education Yes (please comment)   here   Healthy Eating Other (comment)   low carb meal planning to help lwoer blood sugar   Medications Reviewed patients medication for diabetes, action, purpose, timing of dose and side effects.    Acute complications Discussed and identified patients' prevention, symptoms, and treatment of hyperglycemia.    Chronic complications Dental care;Retinopathy and reason for yearly dilated eye exams;Nephropathy, what it is, prevention of, the use of ACE, ARB's and early detection of through urine microalbumia.;Reviewed with patient heart disease, higher risk of, and prevention;Lipid levels, blood glucose control and heart disease;Assessed and discussed foot care and prevention of foot problems;Relationship between chronic complications and blood glucose control    Diabetes Stress and Support Role of stress on diabetes;Identified and addressed patients feelings and concerns about diabetes      Patient Self-Evaluation of Goals - Patient rates self as meeting previously set goals (% of time)   Monitoring >75% (most of the time)      Outcomes   Program Status  Not Completed      Subsequent Visit   Since your last visit have you continued or begun to take your medications as prescribed? Yes    Since your last visit have you had your blood pressure checked? No    Is your most recent blood pressure lower, unchanged, or higher since your last visit? Unchanged    Since your last visit, are you checking your blood glucose at least once a day? Yes             Learning Objective:  Patient will have a greater understanding of diabetes self-management. Patient education plan is to attend individual and/or group sessions per assessed needs and concerns.   Plan:   Patient Instructions  Call DR. Groat's office to schedule a diabetes eye exam- [phone] Please follow up open same day  you see the doctor.  Keep up the great work taking your meds and insulin before meals.  [name] [phone]    Expected Outcomes:  Demonstrated interest in learning. Expect positive outcomes  Education material provided: Carbohydrate counting sheet  If problems or questions, patient to contact team via:  Phone  Future DSME appointment: - 4-6 wks Debera Lat, RD 11/02/2022 4:51 PM.
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
## 2022-11-02 NOTE — Assessment & Plan Note (Deleted)
Recent laboratory testing demonstrated microalbuminuria and proteinuria. Prescribed losartan in 3-5, but patient self-discontinued after experiencing restless legs. Proteinuria likely contributing to lower extremity swelling. Given long history of elevated A1C around 12, etiology most likely chronic uncontrolled diabetes. Plan to control proteinuria with losartan and will consider nephrology referral if unsuccessful.  - Start losartan 12.5mg  q24 - Recheck uAlb:Cr ratio at next [facility] visit

## 2022-11-02 NOTE — Patient Instructions (Addendum)
Call DR. Groat's office to schedule a diabetes eye exam- [phone] Please follow up open same day you see the doctor.  Keep up the great work taking your meds and insulin before meals.  [name] [phone]

## 2022-11-02 NOTE — Assessment & Plan Note (Signed)
>>  ASSESSMENT AND PLAN FOR DIABETIC KIDNEY DISEASE (HCC) WRITTEN ON 11/02/2022  7:04 PM BY HARPER, DANIEL, MD  Recent laboratory testing demonstrated microalbuminuria and proteinuria. Prescribed losartan  in 3-5, but patient self-discontinued after experiencing restless legs. Proteinuria likely contributing to lower extremity swelling. Given long history of elevated A1C around 12, etiology most likely chronic uncontrolled diabetes. Plan to control proteinuria with losartan  and will consider nephrology referral if unsuccessful.  - Start losartan  12.5mg  q24 - Recheck uAlb:Cr ratio at next [facility] visit

## 2022-11-02 NOTE — Assessment & Plan Note (Signed)
Patient has thyroid nodule measuring 1.6cm discovered incidentally on recent chest CT. Subsequent TSH was within normal limits. Denies head or cold intolerances, tremors, and palpitations.  - Thyroid ultrasound scheduled for 3-22

## 2022-11-02 NOTE — Assessment & Plan Note (Signed)
Patient has a history of hypertension managed with metoprolol and isosorbide, also takes furosemide for lower extremity swelling secondary to heart failure. Reports daily adherence to these medications, including earlier today. Prescribed losartan on 3-5, but self-discontinued after two days due to bothersome leg restlessness. Today in clinic, BP was 116/61. Denies lightheadedness, dizziness, blurry vision, headache, anxiety, and palpitations.   - Continue isosorbide 30mg  q24 and metoprolol 25mg  q24 - Continue furosemide 40mg  q24 - Start losartan 12.5mg  q24

## 2022-11-02 NOTE — Assessment & Plan Note (Signed)
Recent laboratory testing demonstrated microalbuminuria and proteinuria. Prescribed losartan in 3-5, but patient self-discontinued after experiencing restless legs. Proteinuria likely contributing to lower extremity swelling. Given long history of elevated A1C around 12, etiology most likely chronic uncontrolled diabetes. Plan to control proteinuria with losartan and will consider nephrology referral if unsuccessful.  - Start losartan 12.5mg  q24 - Recheck uAlb:Cr ratio at next [facility] visit

## 2022-11-02 NOTE — Patient Instructions (Signed)
  Thank you, [name], for allowing [name] to provide your care today. Today we discussed . . .  > Hypertension       - please continue to take your blood pressure medications       - we are starting you on losartan 12.5mg , if you experience restless legs then you can stop this medication  > Diabetes       - we are increasing your insulin glargine to 42U daily       - please continue to use 11U of the insulin lispro with meals  > Pain       - we will contact our radiologist to see if there any alternative studies we can perform instead of an MRI   I have ordered the following labs for you:  Lab Orders  No laboratory test(s) ordered today      Tests ordered today:  none   Referrals ordered today:   Referral Orders  No referral(s) requested today      I have ordered the following medication/changed the following medications:   Stop the following medications: Medications Discontinued During This Encounter  Medication Reason   losartan (COZAAR) 50 MG tablet    insulin glargine (LANTUS) 100 UNIT/ML Solostar Pen Reorder     Start the following medications: Meds ordered this encounter  Medications   losartan (COZAAR) 25 MG tablet    Sig: Take 0.5 tablets (12.5 mg total) by mouth daily.    Dispense:  30 tablet    Refill:  0   insulin glargine (LANTUS) 100 UNIT/ML Solostar Pen    Sig: Inject 42 Units into the skin daily.    Dispense:  15 mL    Refill:  11      Follow up:  1 month     Remember:  Please try losartan at the low dose of 12.5mg  daily, you can stop if you experience restless legs. Increase your insulin glargine to 42U daily. We will let you know about our imaging options. Return to clinic in about one month!   Should you have any questions or concerns please call the internal medicine clinic at [phone].     Roswell Nickel, MD [place]

## 2022-11-02 NOTE — Assessment & Plan Note (Signed)
Patient has history of diastolic heart failure, most recent echocardiogram performed 04-2022 demonstrated EF 60-65 without regional wall motion abnormalities. Currently takes furosemide for lower extremity swelling and follows with cardiology, last visit completed five days ago on ? day. Cardiologist recommended starting losartan given microalbuminuria and 4+ proteinuria identified with recent laboratory testing. Discussed starting losartan at low dose, to which patient agrees, and we can stop if leg restlessness manifests. Denies any acute changes in breathing, orthopnea, or lower extremity swelling.  - Continue furosemide 40mg  q24  - Start losartan 12.5mg  q24

## 2022-11-05 ENCOUNTER — Ambulatory Visit (HOSPITAL_COMMUNITY): Payer: Medicaid Other

## 2022-11-11 ENCOUNTER — Ambulatory Visit: Payer: Medicaid Other | Admitting: Podiatry

## 2022-11-11 NOTE — Progress Notes (Signed)
Internal Medicine Clinic Attending  Case discussed with Dr. Harper  At the time of the visit.  We reviewed the resident's history and exam and pertinent patient test results.  I agree with the assessment, diagnosis, and plan of care documented in the resident's note.  

## 2022-11-24 ENCOUNTER — Ambulatory Visit: Payer: Medicaid Other | Admitting: Podiatry

## 2022-11-24 DIAGNOSIS — L97512 Non-pressure chronic ulcer of other part of right foot with fat layer exposed: Secondary | ICD-10-CM | POA: Diagnosis not present

## 2022-11-28 NOTE — Progress Notes (Signed)
  Subjective:  Patient ID: Andres Richardson, male    DOB: May 18, 1966,  MRN: [id number]  Chief Complaint  Patient presents with   Foot Ulcer    4 week follow up right foot    57 y.o. male presents with the above complaint. History confirmed with patient.  He said it is doing okay he has been using the gentamicin ointment  Objective:  Physical Exam: Weakly palpable pedal pulses foot is fairly warm and perfused well, the ulceration has returned deep to the hyperkeratotic tissue measuring 0.4 x 0.5 x 0.3 cm.  No purulence drainage or evidence of infection      Assessment:   1. Ulcer of right foot with fat layer exposed       Plan:  Patient was evaluated and treated and all questions answered.   Ulcer right foot -Doing much better.  Very little drainage and improved dimensions today.  Continue use gentamicin ointment.  Refill sent to pharmacy.  Full-thickness excisional debridement was completed with a sharp scalpel to the subcutaneous layer to remove all nonviable tissue, hyperkeratosis and slough.  Postdebridement measurements noted above.  Return in 3 weeks for ongoing wound care  Return in about 3 weeks (around 12/15/2022) for wound care.

## 2022-12-06 ENCOUNTER — Ambulatory Visit (INDEPENDENT_AMBULATORY_CARE_PROVIDER_SITE_OTHER): Payer: Medicaid Other | Admitting: Dietician

## 2022-12-06 ENCOUNTER — Encounter: Payer: Self-pay | Admitting: Internal Medicine

## 2022-12-06 ENCOUNTER — Ambulatory Visit: Payer: Medicaid Other | Admitting: Internal Medicine

## 2022-12-06 VITALS — BP 109/60 | HR 97 | Temp 97.9°F | Ht 71.0 in | Wt 203.0 lb

## 2022-12-06 DIAGNOSIS — E1165 Type 2 diabetes mellitus with hyperglycemia: Secondary | ICD-10-CM

## 2022-12-06 DIAGNOSIS — Z794 Long term (current) use of insulin: Secondary | ICD-10-CM | POA: Diagnosis not present

## 2022-12-06 DIAGNOSIS — R0789 Other chest pain: Secondary | ICD-10-CM

## 2022-12-06 DIAGNOSIS — G8929 Other chronic pain: Secondary | ICD-10-CM | POA: Diagnosis not present

## 2022-12-06 DIAGNOSIS — Z7984 Long term (current) use of oral hypoglycemic drugs: Secondary | ICD-10-CM

## 2022-12-06 DIAGNOSIS — E0821 Diabetes mellitus due to underlying condition with diabetic nephropathy: Secondary | ICD-10-CM

## 2022-12-06 LAB — POCT GLYCOSYLATED HEMOGLOBIN (HGB A1C): Hemoglobin A1C: 10.2 % — AB (ref 4.0–5.6)

## 2022-12-06 LAB — GLUCOSE, CAPILLARY: Glucose-Capillary: 387 mg/dL — ABNORMAL HIGH (ref 70–99)

## 2022-12-06 MED ORDER — LIDOCAINE 5 % EX PTCH
1.0000 | MEDICATED_PATCH | CUTANEOUS | 0 refills | Status: DC
Start: 2022-12-06 — End: 2023-02-18

## 2022-12-06 MED ORDER — GABAPENTIN 100 MG PO CAPS
100.0000 mg | ORAL_CAPSULE | Freq: Three times a day (TID) | ORAL | 1 refills | Status: DC
Start: 2022-12-06 — End: 2023-02-21

## 2022-12-06 NOTE — Patient Instructions (Addendum)
Dear Andres Richardson,  Thank you for trusting [name] with your care.   We would like for your to see an Endocrinologist about your diabetes. Please see a kidney doctor about the protein in your urine.  For the pain, please continue taking the duloxetine. I have sent in a prescription of gabapentin to try along with some lidocaine patches.  Please follow up in 1 month.
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
## 2022-12-06 NOTE — Assessment & Plan Note (Signed)
>>  ASSESSMENT AND PLAN FOR DIABETIC KIDNEY DISEASE (HCC) WRITTEN ON 12/06/2022  9:36 PM BY GRIFFITH PORTER, MD  Patient with significant proteinuria. He is currently on an arb, but without much room to increase from BP perspective. We will try to improve his glycemic control for now, hopefully increase arb as able moving forward as well. Additionally, it does not appear as though he has been referred to nephrology so I will place that order.

## 2022-12-06 NOTE — Progress Notes (Signed)
Diabetes Self-Management Education  Visit Type: Follow-up (#4 after initial)  Appt. Start Time: 1430 Appt. End Time: 1500  12/06/2022  [name], identified by name and date of birth, is a 57 y.o. male with a diagnosis of Diabetes:  .   ASSESSMENT  Lab Results  Component Value Date   HGBA1C 10.2 (A) 12/06/2022   HGBA1C 12.7 (A) 09/08/2022   HGBA1C 12.9 (A) 06/02/2022   HGBA1C 12.6 (H) 12/02/2021   HGBA1C 6.8 (H) 05/13/2021       Diabetes Self-Management Education - 12/06/22 1600       Visit Information   Visit Type Follow-up   #4 after initial     Health Coping   How would you rate your overall health? Good      Pre-Education Assessment   Patient understands using medications safely. Needs Review      Patient Education   Previous Diabetes Education Yes (please comment)   here   Medications Taught/reviewed insulin/injectables, injection, site rotation, insulin/injectables storage and needle disposal.   he demonstrated at first injecting without turning the dial to the amounts of insulin he should take and when confrontied about this says he does not do this at home. then he repeated demo correctly.     Patient Self-Evaluation of Goals - Patient rates self as meeting previously set goals (% of time)   Monitoring >75% (most of the time)      Post-Education Assessment   Patient understands incorporating nutritional management into lifestyle. Comprehends key points    Patient understands using medications safely. Comphrehends key points    Patient understands monitoring blood glucose, interpreting and using results Comprehends key points    Patient understands prevention, detection, and treatment of acute complications. Comprehends key points    Patient understands prevention, detection, and treatment of chronic complications. Comprehends key points    Patient understands how to develop strategies to address psychosocial issues. Comprehends key points      Outcomes    Expected Outcomes Demonstrated interest in learning. Expect positive outcomes    Future DMSE 3-4 months    Program Status Completed      Subsequent Visit   Since your last visit have you continued or begun to take your medications as prescribed? Yes   missed last night's lantus dose   Since your last visit have you had your blood pressure checked? Yes    Is your most recent blood pressure lower, unchanged, or higher since your last visit? Unchanged    Since your last visit have you experienced any weight changes? Loss    Weight Loss (lbs) 4    Since your last visit, are you checking your blood glucose at least once a day? Yes             Individualized Plan for Diabetes Self-Management Training:   Learning Objective:  Patient will have a greater understanding of diabetes self-management. Patient education plan is to attend individual and/or group sessions per assessed needs and concerns.   Plan:   There are no Patient Instructions on file for this visit.  Expected Outcomes:  Demonstrated interest in learning. Expect positive outcomes  Education material provided: Diabetes Resources  If problems or questions, patient to contact team via:  Phone  Future DSME appointment: 3-4 months for reassessment of diabetes needs Norm Parcel, RD 12/06/2022 4:23 PM.

## 2022-12-06 NOTE — Assessment & Plan Note (Signed)
Patient's glucose remained poorly controlled. He is on lantus 42 units and humalog 10 TID as well as metformin  BID. He has received counseling from diabetic educator and confirmed proper injection technique. He says that he is consistent with insulin administration, works to avoid running out/lapses in coverage. Also reports adequate dietary control. Given patient's difficulty obtaining control over DM, I think he would benefit from an endocrine referral. He will continue working with [facility] for better glucose control in the meantime.

## 2022-12-06 NOTE — Assessment & Plan Note (Signed)
Patient with significant proteinuria. He is currently on an arb, but without much room to increase from BP perspective. We will try to improve his glycemic control for now, hopefully increase arb as able moving forward as well. Additionally, it does not appear as though he has been referred to nephrology so I will place that order.

## 2022-12-07 ENCOUNTER — Telehealth: Payer: Self-pay

## 2022-12-07 NOTE — Telephone Encounter (Signed)
Prior Authorization for patient (Lidoderm 5% patches) came through on cover my meds was submitted with last office notes awaiting approval or denial

## 2022-12-08 NOTE — Telephone Encounter (Signed)
Decision:Approved The pharmacy department has reviewed the request by Adron Bene for the following medication(s).  Drug:Lidocaine External Patch 5%  Quantity:34  Duration:34 And has determined the following:  Lidocaine External Patch 5% is approved from 12/07/2022-12/07/2023. All strengths of the drug are approved.  Approval has been faxed to the pharmacy.

## 2022-12-10 NOTE — Progress Notes (Signed)
Internal Medicine Clinic Attending  Case discussed with the resident at the time of the visit.  We reviewed the resident's history and exam and pertinent patient test results.  I agree with the assessment, diagnosis, and plan of care documented in the resident's note.  

## 2022-12-15 ENCOUNTER — Ambulatory Visit: Payer: Medicaid Other | Admitting: Podiatry

## 2022-12-15 DIAGNOSIS — L97512 Non-pressure chronic ulcer of other part of right foot with fat layer exposed: Secondary | ICD-10-CM

## 2022-12-16 ENCOUNTER — Encounter: Payer: Self-pay | Admitting: Podiatry

## 2022-12-16 NOTE — Progress Notes (Signed)
  Subjective:  Patient ID: Andres Richardson, male    DOB: Aug 12, 1966,  MRN: [id number]  Chief Complaint  Patient presents with   Foot Ulcer    3 week follow up right foot    57 y.o. male presents with the above complaint. History confirmed with patient.  He said it is doing okay he has been using the gentamicin ointment  Objective:  Physical Exam: Weakly palpable pedal pulses foot is fairly warm and perfused well, the ulceration has returned deep to the hyperkeratotic tissue measuring 0.2 x 0.2 x 0.2 cm.  No purulence drainage or evidence of infection      Assessment:   1. Ulcer of right foot with fat layer exposed (HCC)       Plan:  Patient was evaluated and treated and all questions answered.   Ulcer right foot -Doing much better.  Very little drainage and improved dimensions today.  Continue use gentamicin ointment.  Refill sent to pharmacy.  Full-thickness excisional debridement was completed with a sharp scalpel to the subcutaneous layer to remove all nonviable tissue, hyperkeratosis and slough.  Postdebridement measurements noted above.  Return in 3 weeks for ongoing wound care  Return in about 3 weeks (around 01/05/2023) for wound care.

## 2023-01-05 ENCOUNTER — Ambulatory Visit: Payer: Medicaid Other | Admitting: Student

## 2023-01-05 ENCOUNTER — Encounter: Payer: Self-pay | Admitting: Student

## 2023-01-05 ENCOUNTER — Other Ambulatory Visit: Payer: Self-pay

## 2023-01-05 VITALS — BP 117/61 | HR 89 | Temp 98.7°F | Resp 24 | Ht 71.0 in | Wt 209.8 lb

## 2023-01-05 DIAGNOSIS — Z1211 Encounter for screening for malignant neoplasm of colon: Secondary | ICD-10-CM

## 2023-01-05 DIAGNOSIS — E1165 Type 2 diabetes mellitus with hyperglycemia: Secondary | ICD-10-CM

## 2023-01-05 DIAGNOSIS — G8929 Other chronic pain: Secondary | ICD-10-CM

## 2023-01-05 DIAGNOSIS — E041 Nontoxic single thyroid nodule: Secondary | ICD-10-CM

## 2023-01-05 DIAGNOSIS — Z7984 Long term (current) use of oral hypoglycemic drugs: Secondary | ICD-10-CM

## 2023-01-05 DIAGNOSIS — Z794 Long term (current) use of insulin: Secondary | ICD-10-CM

## 2023-01-05 DIAGNOSIS — E0821 Diabetes mellitus due to underlying condition with diabetic nephropathy: Secondary | ICD-10-CM

## 2023-01-05 DIAGNOSIS — R0789 Other chest pain: Secondary | ICD-10-CM

## 2023-01-05 NOTE — Patient Instructions (Addendum)
For your chest wall pain, please increase your gabapentin to two tablets at night (200mg ).   We will reach out to radiology to see if you can get an MRI.   Please follow up in 1 month.   You should receive a phone call about your endocrine, nephrology (kidney), and thyroid ultrasound appointments.   Please increase your lantus to 45 u if you have any low blood sugars, please call and we can adjust your blood sugar.

## 2023-01-06 ENCOUNTER — Ambulatory Visit: Payer: Medicare Other | Admitting: Podiatry

## 2023-01-09 MED ORDER — INSULIN GLARGINE 100 UNIT/ML SOLOSTAR PEN
45.0000 [IU] | PEN_INJECTOR | Freq: Every day | SUBCUTANEOUS | 11 refills | Status: DC
Start: 2023-01-09 — End: 2023-02-17

## 2023-01-09 NOTE — Assessment & Plan Note (Signed)
>>  ASSESSMENT AND PLAN FOR DIABETIC KIDNEY DISEASE (HCC) WRITTEN ON 01/09/2023  7:02 AM BY FRANCHOT NOVEL, MD  Referral recently approved for patient. Appointment has not been scheduled yet.

## 2023-01-09 NOTE — Progress Notes (Signed)
   CC: F/u regarding L chest wall discomfort  HPI:  [name] is a 57 y.o. M w/ PMH per below who presents for follow up of his left chest wall pain. Please see problem based charting under encounters tab for further details.    Past Medical History:  Diagnosis Date   Diabetes mellitus without complication (HCC)    Type !! 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 Diabetic neuropathy.   Grade I diastolic dysfunction 12/02/2021   Hyperlipidemia    Mixed   Hypertension    Essential   Microalbuminuria    Obesity    Peripheral arterial disease (HCC)    With Claudication   Tobacco use disorder    Review of Systems:  Please see problem based charting under encounters tab for further details.    Physical Exam:  Vitals:   01/05/23 1416  BP: 117/61  Pulse: 89  Resp: (!) 24  Temp: 98.7 F (37.1 C)  TempSrc: Oral  SpO2: 99%  Weight: 209 lb 12.8 oz (95.2 kg)  Height: 5\' 11"  (1.803 m)    Constitutional: Well-developed, well-nourished, and in no distress. Patient in wheelchair due to chronic foot wounds Chest wall: No TTP of L chest, no erythema or eccymosis  Cardiovascular: Normal rate, regular rhythm, intact distal pulses. No gallop and no friction rub.  No murmur heard. No lower extremity edema  Pulmonary: Non labored breathing on room air, no wheezing or rales  Abdominal: Soft. Normal bowel sounds. Non distended and non tender Musculoskeletal: Normal range of motion. Bilateral feet bandaged. C/d/I.  Neurological: Alert and oriented to person, place, and time. Non focal  Skin: Skin is warm and dry.    Assessment & Plan:   See Encounters Tab for problem based charting.  Patient discussed with Dr. Mayford Knife

## 2023-01-09 NOTE — Assessment & Plan Note (Signed)
Message sent to front desk to see if this could be rescheduled.
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
## 2023-01-09 NOTE — Assessment & Plan Note (Signed)
Referral recently approved for patient. Appointment has not been scheduled yet.

## 2023-01-09 NOTE — Assessment & Plan Note (Signed)
His blood sugar remains poorly controlled when looking at cgm data. His referral to endocrine was recently approved and he should be contacted by them to schedule an appointment soon. In the interim increased patient's lantus to 45u nightly. He was also given information from our RD regarding insulin pump.

## 2023-01-10 ENCOUNTER — Other Ambulatory Visit: Payer: Self-pay | Admitting: Student

## 2023-01-17 ENCOUNTER — Telehealth: Payer: Self-pay

## 2023-01-17 NOTE — Progress Notes (Signed)
Internal Medicine Clinic Attending  Case discussed with Dr. Carter  At the time of the visit.  We reviewed the resident's history and exam and pertinent patient test results.  I agree with the assessment, diagnosis, and plan of care documented in the resident's note.  

## 2023-01-17 NOTE — Telephone Encounter (Signed)
Prior Authorization for patient (Dexcom G7 Sensor) came through on cover my meds was submitted with last office notes and labs awaiting approval or denial 

## 2023-01-18 ENCOUNTER — Ambulatory Visit (INDEPENDENT_AMBULATORY_CARE_PROVIDER_SITE_OTHER): Payer: Medicare Other | Admitting: Podiatry

## 2023-01-18 DIAGNOSIS — M79674 Pain in right toe(s): Secondary | ICD-10-CM

## 2023-01-18 DIAGNOSIS — L97512 Non-pressure chronic ulcer of other part of right foot with fat layer exposed: Secondary | ICD-10-CM | POA: Diagnosis not present

## 2023-01-18 DIAGNOSIS — B351 Tinea unguium: Secondary | ICD-10-CM

## 2023-01-18 DIAGNOSIS — M79675 Pain in left toe(s): Secondary | ICD-10-CM | POA: Diagnosis not present

## 2023-01-18 MED ORDER — GENTAMICIN SULFATE 0.1 % EX OINT: 1.0000 | TOPICAL_OINTMENT | Freq: Every day | CUTANEOUS | 3 refills | Status: DC

## 2023-01-18 NOTE — Progress Notes (Signed)
  Subjective:  Patient ID: Andres Richardson, male    DOB: 14-Jun-1966,  MRN: [id number]  Chief Complaint  Patient presents with   Foot Ulcer    3 week follow up right foot    57 y.o. male presents with the above complaint. History confirmed with patient.  Still using the gentamicin ointment.  Needs a refill.  Says his blood sugar has been elevated.  Some blood sugars in the 180s.  Nails thickened elongated causing pain in shoes as well.  Objective:  Physical Exam: Weakly palpable pedal pulses foot is fairly warm and perfused well, the ulceration has returned deep to the hyperkeratotic tissue measuring 3.0 x 1.0 x 0.4 cm.  Much larger than previous.  No purulence drainage or evidence of infection.  Granular wound bed after debridement of hyperkeratosis.  Thickened elongated yellow dystrophic nails with subungual debris      Assessment:   1. Ulcer of right foot with fat layer exposed (HCC)   2. Pain due to onychomycosis of toenails of both feet       Plan:  Patient was evaluated and treated and all questions answered.   Ulcer right foot -Has returned later than scheduled, wound has worsened due to buildup of hyperkeratosis.  Still appears to be granular and healing well without signs of infection, I discussed with him the primary reason I think this is not healing likely is due to his continued hyperglycemia.  We discussed the importance of good blood sugar control.  Continue use gentamicin ointment.  Refill sent to pharmacy.  Full-thickness excisional debridement was completed with a sharp scalpel to the subcutaneous layer to remove all nonviable tissue, hyperkeratosis and slough.  Postdebridement measurements noted above.  Return in 3 weeks for ongoing wound care  Discussed the etiology and treatment options for the condition in detail with the patient. Recommended debridement of the nails today. Sharp and mechanical debridement performed of all painful and mycotic nails today. Nails  debrided in length and thickness using a nail nipper to level of comfort. Discussed treatment options including appropriate shoe gear. Follow up as needed for painful nails.   Return in about 3 weeks (around 02/08/2023) for wound care.

## 2023-01-19 ENCOUNTER — Encounter: Payer: Self-pay | Admitting: Internal Medicine

## 2023-01-19 ENCOUNTER — Ambulatory Visit (INDEPENDENT_AMBULATORY_CARE_PROVIDER_SITE_OTHER): Payer: Medicare Other | Admitting: Internal Medicine

## 2023-01-19 ENCOUNTER — Encounter: Payer: Self-pay | Admitting: *Deleted

## 2023-01-19 VITALS — BP 125/66 | HR 80 | Temp 98.1°F | Wt 214.3 lb

## 2023-01-19 DIAGNOSIS — G8929 Other chronic pain: Secondary | ICD-10-CM | POA: Diagnosis not present

## 2023-01-19 DIAGNOSIS — Z7984 Long term (current) use of oral hypoglycemic drugs: Secondary | ICD-10-CM | POA: Diagnosis not present

## 2023-01-19 DIAGNOSIS — Z794 Long term (current) use of insulin: Secondary | ICD-10-CM

## 2023-01-19 DIAGNOSIS — R0789 Other chest pain: Secondary | ICD-10-CM | POA: Diagnosis not present

## 2023-01-19 DIAGNOSIS — R809 Proteinuria, unspecified: Secondary | ICD-10-CM | POA: Diagnosis present

## 2023-01-19 DIAGNOSIS — I10 Essential (primary) hypertension: Secondary | ICD-10-CM

## 2023-01-19 DIAGNOSIS — E1165 Type 2 diabetes mellitus with hyperglycemia: Secondary | ICD-10-CM | POA: Diagnosis not present

## 2023-01-19 DIAGNOSIS — E041 Nontoxic single thyroid nodule: Secondary | ICD-10-CM

## 2023-01-19 DIAGNOSIS — Z933 Colostomy status: Secondary | ICD-10-CM

## 2023-01-19 DIAGNOSIS — E0821 Diabetes mellitus due to underlying condition with diabetic nephropathy: Secondary | ICD-10-CM

## 2023-01-19 DIAGNOSIS — I25110 Atherosclerotic heart disease of native coronary artery with unstable angina pectoris: Secondary | ICD-10-CM | POA: Diagnosis not present

## 2023-01-19 DIAGNOSIS — S91301A Unspecified open wound, right foot, initial encounter: Secondary | ICD-10-CM | POA: Diagnosis not present

## 2023-01-19 MED ORDER — OLMESARTAN MEDOXOMIL 20 MG PO TABS
20.0000 mg | ORAL_TABLET | Freq: Every day | ORAL | 0 refills | Status: DC
Start: 2023-01-19 — End: 2023-01-19

## 2023-01-19 MED ORDER — LOSARTAN POTASSIUM 25 MG PO TABS
12.5000 mg | ORAL_TABLET | Freq: Every day | ORAL | 2 refills | Status: DC
Start: 2023-01-19 — End: 2023-01-20
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
## 2023-01-19 NOTE — Assessment & Plan Note (Addendum)
Was unable to acquire CGM results to assess blood sugar results and make a plan for medication adjustments. Will have patient bring receiver at follow up to assess changes that need to be made to medication regimen. Patient given contact information for endocrinology.  P: -Continue lantus 45 units, he is tolerating recent increase from 40 to 45 well. No low blood sugars noted -Continue humalog 10 units TID -Continue metformin 1000 mg BID -follow-up in 1 month

## 2023-01-19 NOTE — Assessment & Plan Note (Signed)
Microalbumin/creatinine ratio assessed 10/2022 significant >5000 and albumin 3.3. Multiple myeloma ruled out with myeloma panel. Patient given contact information for nephrology for follow up. 

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
## 2023-01-19 NOTE — Patient Instructions (Addendum)
Thank you, [name] for allowing [name] to provide your care today.   Diabetes: Please continue taking lantus 45 units and humalog 10 units TID. Continue taking metformin 100 mg 2 x daily I have included the contact information for endocrine.  [facility] Endocrin[facility] [place]ocrin[place] Address: [address] [place], [place] [address]   Phone: [phone]  Be sure to follow-up with [facility] for your wound. Your feet are high risk for getting bad infections.   Protein in urine: Continue taking losartan 12.5 mg daily. This medication can help with protein in urine.  Your need to follow up with kidney doctor, I have included contact information below. I will call with results from labs today at the end of the week  [facility] center in [place], [place] Address: [address] [place], [place] [address] Phone: [phone]  I will also check into thyroid ultrasound  Chest wall pain:  Take gabapentin 3 x a day and continue duloxetine.  I have ordered the following medication/changed the following medications:   Stop the following medications: Medications Discontinued During This Encounter  Medication Reason   potassium chloride SA (KLOR-CON M) 20 MEQ tablet    losartan (COZAAR) 25 MG tablet    olmesartan (BENICAR) 20 MG tablet Change in therapy     Start the following medications: Meds ordered this encounter  Medications   DISCONTD: olmesartan (BENICAR) 20 MG tablet    Sig: Take 1 tablet (20 mg total) by mouth daily.    Dispense:  90 tablet    Refill:  0   losartan (COZAAR) 25 MG tablet    Sig: Take 0.5 tablets (12.5 mg total) by mouth daily.    Dispense:  30 tablet    Refill:  2     Follow up: 1 month for a1c   We look forward to seeing you next time. Please call our clinic at [phone] if you have any questions or concerns. The best time to call is Monday-Friday from 9am-4pm, but  there is someone available 24/7. If after hours or the weekend, call the main hospital number and ask for the Internal Medicine Resident On-Call. If you need medication refills, please notify your pharmacy one week in advance and they will send [name] a request.   Thank you for trusting me with your care. Wishing you the best!   Rudene Christians, DO [facility]

## 2023-01-19 NOTE — Assessment & Plan Note (Signed)
Patient to take losartan 12.5 mg daily to help with proteinuria.

## 2023-01-19 NOTE — Assessment & Plan Note (Addendum)
Patient to continue to follow up with foot specialist to assess would healing and with PCP for appropriate medication adjustments for diabetes management. Patient encouraged to continue follow up with [facility] since he is at high risk of infection.

## 2023-01-19 NOTE — Assessment & Plan Note (Signed)
Patient to follow up with PCP so medication regiment can be adjusted for better diabetes control, therefore allowing for future colostomy removal.

## 2023-01-20 ENCOUNTER — Encounter: Payer: Self-pay | Admitting: Internal Medicine

## 2023-01-20 LAB — PROTEIN / CREATININE RATIO, URINE
Creatinine, Urine: 43.3 mg/dL
Protein, Ur: 299.7 mg/dL
Protein/Creat Ratio: 6921 mg/g creat — ABNORMAL HIGH (ref 0–200)

## 2023-01-20 MED ORDER — LOSARTAN POTASSIUM 25 MG PO TABS
12.5000 mg | ORAL_TABLET | Freq: Every day | ORAL | 2 refills | Status: DC
Start: 2023-01-20 — End: 2023-02-20

## 2023-01-20 NOTE — Assessment & Plan Note (Signed)
>>  ASSESSMENT AND PLAN FOR DIABETIC KIDNEY DISEASE (HCC) WRITTEN ON 01/20/2023  6:46 AM BY [name], [name], DO  Microalbumin/creatinine ratio assessed 10/2022 significant >5000 and albumin  3.3. Multiple myeloma ruled out with myeloma panel.Losartan  was discontinued this spring due to leg jitters. Initially patient did not think they were taking this. However they later confirmed taking losartan . Presentation not consistent with nephrotic syndrome. 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 P: Information to contact [facility] included in AVS Recheck CMP Pr/creatinine ratio Continue losartan  12.5 mg qd, refill sent

## 2023-01-20 NOTE — Assessment & Plan Note (Signed)
Microalbumin/creatinine ratio assessed 10/2022 significant >5000 and albumin 3.3. Multiple myeloma ruled out with myeloma panel.Losartan was discontinued this spring due to leg jitters. Initially patient did not think they were taking this. However they later confirmed taking losartan. Presentation not consistent with nephrotic syndrome. 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 P: Information to contact [facility] included in AVS Recheck CMP Pr/creatinine ratio Continue losartan 12.5 mg qd, refill sent
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
## 2023-01-20 NOTE — Assessment & Plan Note (Signed)
Message sent to follow-up on thyroid US. TSH was wnl this spring.

## 2023-01-20 NOTE — Progress Notes (Signed)
Internal Medicine Clinic Attending  Case discussed with Dr. [name]  At the time of the visit.  We reviewed the resident's history and exam and pertinent patient test results.  I agree with the assessment, diagnosis, and plan of care documented in the resident's note.  

## 2023-01-21 ENCOUNTER — Ambulatory Visit (INDEPENDENT_AMBULATORY_CARE_PROVIDER_SITE_OTHER): Payer: Medicare Other | Admitting: Physician Assistant

## 2023-01-21 ENCOUNTER — Ambulatory Visit (HOSPITAL_COMMUNITY)
Admission: RE | Admit: 2023-01-21 | Discharge: 2023-01-21 | Disposition: A | Discharge status: Home / Self Care | Payer: Medicare Other | Source: Ambulatory Visit | Attending: Vascular Surgery | Admitting: Vascular Surgery

## 2023-01-21 VITALS — BP 147/82 | HR 77 | Temp 97.9°F | Resp 20 | Ht 71.0 in | Wt 213.0 lb

## 2023-01-21 DIAGNOSIS — I70239 Atherosclerosis of native arteries of right leg with ulceration of unspecified site: Secondary | ICD-10-CM

## 2023-01-21 DIAGNOSIS — F17200 Nicotine dependence, unspecified, uncomplicated: Secondary | ICD-10-CM

## 2023-01-21 DIAGNOSIS — I739 Peripheral vascular disease, unspecified: Secondary | ICD-10-CM | POA: Diagnosis present

## 2023-01-21 LAB — CMP14 + ANION GAP
ALT: 8 IU/L (ref 0–44)
AST: 9 IU/L (ref 0–40)
Albumin/Globulin Ratio: 1.3 (ref 1.2–2.2)
Albumin: 3.4 g/dL — ABNORMAL LOW (ref 3.8–4.9)
Alkaline Phosphatase: 94 IU/L (ref 44–121)
Anion Gap: 13 mmol/L (ref 10.0–18.0)
BUN/Creatinine Ratio: 19 (ref 9–20)
BUN: 21 mg/dL (ref 6–24)
Bilirubin Total: <0.2 mg/dL (ref 0.0–1.2)
CO2: 21 mmol/L (ref 20–29)
Calcium: 8.3 mg/dL — ABNORMAL LOW (ref 8.7–10.2)
Chloride: 105 mmol/L (ref 96–106)
Creatinine, Ser: 1.13 mg/dL (ref 0.76–1.27)
Globulin, Total: 2.7 g/dL (ref 1.5–4.5)
Glucose: 360 mg/dL — ABNORMAL HIGH (ref 70–99)
Potassium: 4.4 mmol/L (ref 3.5–5.2)
Sodium: 139 mmol/L (ref 134–144)
Total Protein: 6.1 g/dL (ref 6.0–8.5)
eGFR: 76 mL/min/{1.73_m2} (ref >59)
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
## 2023-01-22 LAB — VAS US ABI WITH/WO TBI
Left ABI: 0.38
Right ABI: 0.44

## 2023-01-26 ENCOUNTER — Ambulatory Visit (HOSPITAL_COMMUNITY)
Admission: RE | Admit: 2023-01-26 | Discharge: 2023-01-26 | Disposition: A | Discharge status: Home / Self Care | Payer: Medicare Other | Source: Ambulatory Visit | Attending: Internal Medicine | Admitting: Internal Medicine

## 2023-01-26 DIAGNOSIS — E041 Nontoxic single thyroid nodule: Secondary | ICD-10-CM | POA: Diagnosis present

## 2023-01-31 ENCOUNTER — Other Ambulatory Visit: Payer: Self-pay

## 2023-01-31 DIAGNOSIS — E041 Nontoxic single thyroid nodule: Secondary | ICD-10-CM

## 2023-01-31 NOTE — Progress Notes (Signed)
Attempted to call patient regarding thyroid ultrasound results, left voicemail to call clinic for results. New TR-4 1.7cm R thyroid nodule, biopsy order placed. TR-3 R 2.1cm nodule will need 1 year follow up.

## 2023-02-09 ENCOUNTER — Ambulatory Visit (INDEPENDENT_AMBULATORY_CARE_PROVIDER_SITE_OTHER): Payer: Medicare Other | Admitting: Podiatry

## 2023-02-09 DIAGNOSIS — E0843 Diabetes mellitus due to underlying condition with diabetic autonomic (poly)neuropathy: Secondary | ICD-10-CM

## 2023-02-09 DIAGNOSIS — L97512 Non-pressure chronic ulcer of other part of right foot with fat layer exposed: Secondary | ICD-10-CM | POA: Diagnosis not present

## 2023-02-09 DIAGNOSIS — I739 Peripheral vascular disease, unspecified: Secondary | ICD-10-CM

## 2023-02-15 ENCOUNTER — Telehealth: Payer: Self-pay | Admitting: Dietician

## 2023-02-15 NOTE — Telephone Encounter (Signed)
Left voicemail for return call  

## 2023-02-15 NOTE — Progress Notes (Signed)
Chief Complaint  Patient presents with   Diabetic Ulcer    Patient came in today for right foot Diabetic ulcer follow-up, patient has a new blod blister on the lateral side of the foot ,TX: gentamicin ointment. A1c-10.2 Bg- Not taking at this time     Subjective:  57 y.o. male with PMHx of diabetes mellitus presenting today for follow-up evaluation of right foot ulcer.  He has been changing the dressings at home.  Overall improvement.  Presenting for further treatment and evaluation   Past Medical History:  Diagnosis Date   CAD (coronary artery disease) 2022     Prox RCA lesion is 100% stenosed.   Prox Cx to Mid Cx lesion is 60% stenosed.   LPAV lesion is 60% stenosed.   Ost LAD to Prox LAD lesion is 40% stenosed.   Diabetes mellitus without complication (HCC)    Type !! 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 Diabetic neuropathy.   Fournier gangrene 2022   Grade I diastolic dysfunction 12/02/2021   History of creation of ostomy [facility])    Hyperlipidemia    Mixed   Hypertension    Essential   Obesity    Peripheral arterial disease (HCC)    occluded left common iliac artery   Proteinuria    Tobacco use disorder     Past Surgical History:  Procedure Laterality Date   BIOPSY  12/04/2021   Procedure: BIOPSY;  Surgeon: Kerin Salen, MD;  [facility]: [facility];  Service: Gastroenterology;;   ESOPHAGOGASTRODUODENOSCOPY (EGD) WITH PROPOFOL N/A 12/04/2021   Procedure: ESOPHAGOGASTRODUODENOSCOPY (EGD) WITH PROPOFOL;  Surgeon: Kerin Salen, MD;  [facility];  Service: Gastroenterology;  Laterality: N/A;   INCISION AND DRAINAGE ABSCESS N/A 02/24/2021   Procedure: INCISION AND DRAINAGE PERINEUM;  Surgeon: Sheliah Hatch, De Blanch, MD;  [facility]: [facility];  Service: General;  Laterality: N/A;   INCISION AND DRAINAGE ABSCESS N/A 02/25/2021   Procedure: REPEAT WASHOUT OF PERINEUM;  Surgeon: Sheliah Hatch De Blanch, MD;  Location: [facility];  Service: General;  Laterality: N/A;    LAPAROSCOPIC DIVERTED COLOSTOMY N/A 02/25/2021   Procedure: LAPAROSCOPIC DIVERTING SIGMOID COLOSTOMY;  Surgeon: [name], MD;  [facility];  Service: General;  Laterality: N/A;   LEFT HEART CATH AND CORONARY ANGIOGRAPHY N/A 05/18/2021   Procedure: LEFT HEART CATH AND CORONARY ANGIOGRAPHY;  Surgeon: Runell Gess, MD;  [facility];  Service: Cardiovascular;  Laterality: N/A;    Allergies  Allergen Reactions   Adhesive [Tape] Rash    RT foot 02/09/2023  Objective/Physical Exam General: The patient is alert and oriented x3 in no acute distress.  Dermatology:  Wound #1 noted to the plantar aspect of the first MTP right foot measuring approximately 1.2 x 1.6 x 0.2 cm (LxWxD).   To the noted ulceration(s), there is no eschar. There is a moderate amount of slough, fibrin, and necrotic tissue noted. Granulation tissue and wound base is red. There is a minimal amount of serosanguineous drainage noted. There is no exposed bone muscle-tendon ligament or joint. There is no malodor. Periwound integrity is intact. Skin is warm, dry and supple bilateral lower extremities.  Vascular: VAS [name] ABI W/WO TBI 01/21/2023 ABI Findings:  +---------+------------------+-----+----------+--------+  Right   Rt Pressure (mmHg)IndexWaveform  Comment   +---------+------------------+-----+----------+--------+  Brachial 185                                        +---------+------------------+-----+----------+--------+  PTA     82                0.44 monophasic          +---------+------------------+-----+----------+--------+  DP      76                0.41 monophasic          +---------+------------------+-----+----------+--------+  Great Toe38                0.21 Abnormal            +---------+------------------+-----+----------+--------+   +---------+------------------+-----+----------+-------+  Left    Lt Pressure (mmHg)IndexWaveform  Comment   +---------+------------------+-----+----------+-------+  Brachial 170                                       +---------+------------------+-----+----------+-------+  PTA     71                0.38 monophasic         +---------+------------------+-----+----------+-------+  DP      68                0.37 monophasic         +---------+------------------+-----+----------+-------+  Great Toe49                0.26 Abnormal           +---------+------------------+-----+----------+-------+   +-------+-----------+-----------+------------+------------+  ABI/TBIToday's ABIToday's TBIPrevious ABIPrevious TBI  +-------+-----------+-----------+------------+------------+  Right 0.44       0.21       0.71        0.49          +-------+-----------+-----------+------------+------------+  Left  0.38       0.26       0.51        0.42          +-------+-----------+-----------+------------+------------+  Bilateral ABIs appear decreased compared to prior study on 05/07/22.    Summary:  Right: Resting right ankle-brachial index indicates severe right lower  extremity arterial disease. The right toe-brachial index is abnormal.  Left: Resting left ankle-brachial index indicates severe left lower  extremity arterial disease. The left toe-brachial index is abnormal.   Neurological: Light touch and protective threshold diminished bilaterally.   Musculoskeletal Exam: No pedal deformity.  No prior amputations  Assessment: 1.  Ulcer plantar aspect of the first MTP right secondary to diabetes mellitus 2. diabetes mellitus w/ peripheral neuropathy 3.  PAD B/L lower extremities   Plan of Care:  -Patient was evaluated.  Overall significant improvement -Medically necessary excisional debridement including subcutaneous tissue was performed using a tissue nipper and a chisel blade. Excisional debridement of all the necrotic nonviable tissue down to healthy bleeding viable  tissue was performed with post-debridement measurements same as pre-. -Continue same dressing change regimen and offloading -Appointment with vascular scheduled for 03/04/2023 -Return to clinic 3 weeks   Felecia Shelling, DPM [facility]  Dr. Felecia Shelling, DPM    [address] [place], [place] [address]                Office [phone]  Fax [phone]

## 2023-02-16 ENCOUNTER — Telehealth: Payer: Self-pay | Admitting: Dietician

## 2023-02-16 ENCOUNTER — Ambulatory Visit: Payer: Medicare Other

## 2023-02-16 ENCOUNTER — Ambulatory Visit (INDEPENDENT_AMBULATORY_CARE_PROVIDER_SITE_OTHER): Payer: Medicare Other | Admitting: Internal Medicine

## 2023-02-16 ENCOUNTER — Encounter: Payer: Self-pay | Admitting: Internal Medicine

## 2023-02-16 VITALS — BP 122/56 | HR 82 | Temp 98.0°F | Ht 71.0 in | Wt 216.0 lb

## 2023-02-16 DIAGNOSIS — E1129 Type 2 diabetes mellitus with other diabetic kidney complication: Secondary | ICD-10-CM

## 2023-02-16 DIAGNOSIS — Z794 Long term (current) use of insulin: Secondary | ICD-10-CM

## 2023-02-16 DIAGNOSIS — F17210 Nicotine dependence, cigarettes, uncomplicated: Secondary | ICD-10-CM

## 2023-02-16 DIAGNOSIS — I25119 Atherosclerotic heart disease of native coronary artery with unspecified angina pectoris: Secondary | ICD-10-CM

## 2023-02-16 DIAGNOSIS — E041 Nontoxic single thyroid nodule: Secondary | ICD-10-CM | POA: Diagnosis not present

## 2023-02-16 DIAGNOSIS — E1165 Type 2 diabetes mellitus with hyperglycemia: Secondary | ICD-10-CM | POA: Diagnosis not present

## 2023-02-16 DIAGNOSIS — Z0000 Encounter for general adult medical examination without abnormal findings: Secondary | ICD-10-CM

## 2023-02-16 DIAGNOSIS — E785 Hyperlipidemia, unspecified: Secondary | ICD-10-CM | POA: Diagnosis not present

## 2023-02-16 DIAGNOSIS — E0821 Diabetes mellitus due to underlying condition with diabetic nephropathy: Secondary | ICD-10-CM

## 2023-02-16 DIAGNOSIS — E1151 Type 2 diabetes mellitus with diabetic peripheral angiopathy without gangrene: Secondary | ICD-10-CM | POA: Diagnosis not present

## 2023-02-16 DIAGNOSIS — I25110 Atherosclerotic heart disease of native coronary artery with unstable angina pectoris: Secondary | ICD-10-CM

## 2023-02-16 DIAGNOSIS — I739 Peripheral vascular disease, unspecified: Secondary | ICD-10-CM

## 2023-02-16 LAB — POCT GLYCOSYLATED HEMOGLOBIN (HGB A1C): Hemoglobin A1C: 9.6 % — AB (ref 4.0–5.6)

## 2023-02-16 LAB — GLUCOSE, CAPILLARY: Glucose-Capillary: 308 mg/dL — ABNORMAL HIGH (ref 70–99)

## 2023-02-16 MED ORDER — DULOXETINE HCL 60 MG PO CPEP
60.0000 mg | ORAL_CAPSULE | Freq: Every day | ORAL | 3 refills | Status: DC
Start: 2023-02-16 — End: 2024-04-02

## 2023-02-16 MED ORDER — LANCETS MISC. MISC
1.0000 | Freq: Three times a day (TID) | 0 refills | Status: AC
Start: 2023-02-16 — End: 2023-03-18

## 2023-02-16 MED ORDER — BLOOD GLUCOSE MONITORING SUPPL DEVI: 0 refills | Status: DC

## 2023-02-16 MED ORDER — LANCET DEVICE MISC
1.0000 | Freq: Three times a day (TID) | 0 refills | Status: AC
Start: 2023-02-16 — End: ?

## 2023-02-16 MED ORDER — BLOOD GLUCOSE TEST VI STRP
1.0000 | ORAL_STRIP | Freq: Three times a day (TID) | 3 refills | Status: AC
Start: 2023-02-16 — End: ?

## 2023-02-16 MED ORDER — ASPIRIN 81 MG PO TBEC: 81.0000 mg | DELAYED_RELEASE_TABLET | Freq: Every day | ORAL | 3 refills | Status: DC

## 2023-02-16 NOTE — Addendum Note (Signed)
Addended by: Lucille Passy on: 02/16/2023 05:02 PM   Modules accepted: Level of Service

## 2023-02-16 NOTE — Telephone Encounter (Signed)
Call to patient , he is okay with using [facility] for his Dexcom G& sensors. Order being faxed.

## 2023-02-16 NOTE — Progress Notes (Signed)
Subjective:   Andres Richardson is a 57 y.o. male who presents for an Initial Medicare Annual Wellness Visit.  Visit Complete: In person  Patient Medicare AWV questionnaire was completed by the patient on 02/16/2023; I have confirmed that all information answered by patient is correct and no changes since this date.  Review of Systems    Defer to PCP       Objective:    Today's Vitals   02/16/23 1506 02/16/23 1507  BP: (!) 122/56   Pulse: 82   Temp: 98 F (36.7 C)   TempSrc: Oral   SpO2: 100%   Weight: 216 lb (98 kg)   Height: 5\' 11"  (1.803 m)   PainSc:  5    Body mass index is 30.13 kg/m.     02/16/2023    3:08 PM 02/16/2023    2:38 PM 01/05/2023    2:16 PM 12/06/2022    2:56 PM 11/02/2022    3:00 PM 10/19/2022    3:24 PM 09/21/2022    2:00 PM  Advanced Directives  Does Patient Have a Medical Advance Directive? No No No No No No No  Would patient like information on creating a medical advance directive? No - Patient declined No - Patient declined No - Patient declined No - Patient declined No - Patient declined No - Patient declined No - Patient declined    Current Medications (verified) Outpatient Encounter Medications as of 02/16/2023  Medication Sig   acetaminophen (TYLENOL) 500 MG tablet Take 1,000-1,500 mg by mouth 2 (two) times daily as needed for moderate pain.   atorvastatin (LIPITOR) 40 MG tablet Take 1 tablet (40 mg total) by mouth daily.   calcium carbonate (TUMS - DOSED IN MG ELEMENTAL CALCIUM) 500 MG chewable tablet Chew 1 tablet by mouth at bedtime.   Continuous Glucose Sensor (DEXCOM G7 SENSOR) MISC USE AS DIRECTED ONCE A WEEK   DULoxetine (CYMBALTA) 60 MG capsule Take 1 capsule (60 mg total) by mouth daily.   furosemide (LASIX) 40 MG tablet Take 1 tablet (40 mg total) by mouth daily.   gabapentin (NEURONTIN) 100 MG capsule Take 1 capsule (100 mg total) by mouth 3 (three) times daily.   gentamicin ointment (GARAMYCIN) 0.1 % Apply 1 Application topically  daily.   Homeopathic Products (LEG CRAMPS) TABS Take 3 tablets by mouth daily as needed (leg cramps).   insulin glargine (LANTUS) 100 UNIT/ML Solostar Pen Inject 45 Units into the skin daily.   insulin lispro (HUMALOG KWIKPEN) 100 UNIT/ML KwikPen Inject 10 Units into the skin 3 (three) times daily.   Insulin Pen Needle (PEN NEEDLES) 32G X 4 MM MISC 1 Units by Does not apply route 4 (four) times daily.   Iron, Ferrous Sulfate, 325 (65 Fe) MG TABS Take 1 tablet by mouth daily.   isosorbide mononitrate (IMDUR) 30 MG 24 hr tablet Take 3 tablets (90 mg total) by mouth daily.   lidocaine (LIDODERM) 5 % Place 1 patch onto the skin daily. Remove & Discard patch within 12 hours or as directed by MD   losartan (COZAAR) 25 MG tablet Take 0.5 tablets (12.5 mg total) by mouth daily.   metFORMIN (GLUCOPHAGE) 1000 MG tablet Take 1 tablet (1,000 mg total) by mouth 2 (two) times daily with a meal.   metoprolol succinate (TOPROL-XL) 25 MG 24 hr tablet Take 1 tablet (25 mg total) by mouth daily.   montelukast (SINGULAIR) 10 MG tablet Take 1 tablet (10 mg total) by mouth daily.  Multiple Vitamin (MULTIVITAMIN WITH MINERALS) TABS tablet Take 1 tablet by mouth daily.   nitroGLYCERIN (NITROSTAT) 0.4 MG SL tablet Place 0.4 mg under the tongue every 5 (five) minutes as needed for chest pain.   pantoprazole (PROTONIX) 40 MG tablet Take 1 tablet (40 mg total) by mouth daily.   sertraline (ZOLOFT) 25 MG tablet Take 1 tablet (25 mg total) by mouth daily.   No facility-administered encounter medications on file as of 02/16/2023.    Allergies (verified) Adhesive [tape]   History: Past Medical History:  Diagnosis Date   CAD (coronary artery disease) 2022     Prox RCA lesion is 100% stenosed.   Prox Cx to Mid Cx lesion is 60% stenosed.   LPAV lesion is 60% stenosed.   Ost LAD to Prox LAD lesion is 40% stenosed.   Diabetes mellitus without complication (HCC)    Type !! 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  Diabetic neuropathy.   Fournier gangrene 2022   Grade I diastolic dysfunction 12/02/2021   History of creation of ostomy [facility]    Hyperlipidemia    Mixed   Hypertension    Essential   Obesity    Peripheral arterial disease (HCC)    occluded left common iliac artery   Proteinuria    Tobacco use disorder    Past Surgical History:  Procedure Laterality Date   BIOPSY  12/04/2021   Procedure: BIOPSY;  Surgeon: Kerin Salen, MD;  [facility];  Service: Gastroenterology;;   ESOPHAGOGASTRODUODENOSCOPY (EGD) WITH PROPOFOL N/A 12/04/2021   Procedure: ESOPHAGOGASTRODUODENOSCOPY (EGD) WITH PROPOFOL;  Surgeon: Kerin Salen, MD;  [facility];  Service: Gastroenterology;  Laterality: N/A;   INCISION AND DRAINAGE ABSCESS N/A 02/24/2021   Procedure: INCISION AND DRAINAGE PERINEUM;  Surgeon: Sheliah Hatch, De Blanch, MD;  [facility];  Service: General;  Laterality: N/A;   INCISION AND DRAINAGE ABSCESS N/A 02/25/2021   Procedure: REPEAT WASHOUT OF PERINEUM;  Surgeon: Sheliah Hatch De Blanch, MD;  [facility];  Service: General;  Laterality: N/A;   LAPAROSCOPIC DIVERTED COLOSTOMY N/A 02/25/2021   Procedure: LAPAROSCOPIC DIVERTING SIGMOID COLOSTOMY;  Surgeon: [name], MD;  [facility];  Service: General;  Laterality: N/A;   LEFT HEART CATH AND CORONARY ANGIOGRAPHY N/A 05/18/2021   Procedure: LEFT HEART CATH AND CORONARY ANGIOGRAPHY;  Surgeon: Runell Gess, MD;  [facility];  Service: Cardiovascular;  Laterality: N/A;   Family History  Problem Relation Age of Onset   CAD Mother    Colon cancer Father    Social History   Socioeconomic History   Marital status: Single    Spouse name: Not on file   Number of children: Not on file   Years of education: Not on file   Highest education level: Not on file  Occupational History   Not on file  Tobacco Use   Smoking status: Every Day    Packs/day: .25    Types: Cigarettes   Smokeless  tobacco: Never   Tobacco comments:    5 cigs per day/Uses Nicorette patch  Vaping Use   Vaping Use: Never used  Substance and Sexual Activity   Alcohol use: Never   Drug use: Never   Sexual activity: Not on file  Other Topics Concern   Not on file  Social History Narrative   Not on file   Social Determinants of Health   Financial Resource Strain: Low Risk  (02/16/2023)   Overall Financial Resource Strain (CARDIA)    Difficulty  of Paying Living Expenses: Not hard at all  Food Insecurity: No Food Insecurity (02/16/2023)   Hunger Vital Sign    Worried About Running Out of Food in the Last Year: Never true    Ran Out of Food in the Last Year: Never true  Transportation Needs: No Transportation Needs (02/16/2023)   PRAPARE - Administrator, Civil Service (Medical): No    Lack of Transportation (Non-Medical): No  Physical Activity: Insufficiently Active (02/16/2023)   Exercise Vital Sign    Days of Exercise per Week: 3 days    Minutes of Exercise per Session: 20 min  Stress: No Stress Concern Present (02/16/2023)   [employer] of Occupational Health - Occupational Stress Questionnaire    Feeling of Stress : Not at all  Social Connections: Moderately Isolated (02/16/2023)   Social Connection and Isolation Panel [NHANES]    Frequency of Communication with Friends and Family: More than three times a week    Frequency of Social Gatherings with Friends and Family: Three times a week    Attends Religious Services: 1 to 4 times per year    Active Member of Clubs or Organizations: No    Attends Banker Meetings: Never    Marital Status: Divorced    Tobacco Counseling Ready to quit: Yes Counseling given: No Tobacco comments: 5 cigs per day/Uses Nicorette patch   Clinical Intake:  Pre-visit preparation completed: Yes  Pain : 0-10 Pain Score: 5  Pain Location: Chest Pain Descriptors / Indicators: Stabbing Pain Onset: More than a month ago Pain Frequency:  Constant     Nutritional Risks: None Diabetes: Yes CBG done?: No CBG resulted in Enter/ Edit results?: No Did pt. bring in CBG monitor from home?: No Glucose Meter Downloaded?: No  How often do you need to have someone help you when you read instructions, pamphlets, or other written materials from your doctor or pharmacy?: 1 - Never What is the last grade level you completed in school?: 13 years  Interpreter Needed?: No  Information entered by :: Puneet Masoner,cma   Activities of Daily Living    02/16/2023    3:07 PM 02/16/2023    2:38 PM  In your present state of health, do you have any difficulty performing the following activities:  Hearing? 0 0  Vision? 0 0  Difficulty concentrating or making decisions? 0 0  Walking or climbing stairs? 1 1  Dressing or bathing? 0 0  Doing errands, shopping? 0 0    Patient Care Team: [name], Florentina Addison, DO as PCP - General (Internal Medicine) Parke Poisson, MD as PCP - Cardiology (Cardiology)  Indicate any recent Medical Services you may have received from other than [facility] providers in the past year (date may be approximate).     Assessment:   This is a routine wellness examination for Kyshawn.  Hearing/Vision screen No results found.  Dietary issues and exercise activities discussed:     Goals Addressed   None   Depression Screen    02/16/2023    3:08 PM 02/16/2023    2:38 PM 01/19/2023    3:16 PM 12/06/2022    2:55 PM 11/02/2022    3:00 PM 10/19/2022    3:23 PM 09/21/2022    1:58 PM  PHQ 2/9 Scores  PHQ - 2 Score 0 0 0 0 0 0 0  PHQ- 9 Score    0 0 0 5    Fall Risk    02/16/2023  3:08 PM 02/16/2023    2:37 PM 01/19/2023    3:15 PM 01/05/2023    2:15 PM 12/06/2022    2:55 PM  Fall Risk   Falls in the past year? 1 1 1 1 1   Number falls in past yr: 1 1 1 1  0  Injury with Fall? 0 0 0 0 0  Risk for fall due to : Impaired balance/gait;History of fall(s) Impaired balance/gait;History of fall(s) History of fall(s);Impaired  balance/gait History of fall(s);Impaired balance/gait Impaired balance/gait  Follow up Falls evaluation completed;Falls prevention discussed Falls evaluation completed;Falls prevention discussed Falls evaluation completed Falls evaluation completed;Education provided Falls evaluation completed;Falls prevention discussed    MEDICARE RISK AT HOME:   TIMED UP AND GO:  Was the test performed? No    Cognitive Function:        02/16/2023    3:08 PM  6CIT Screen  What Year? 0 points  What month? 0 points  What time? 0 points  Count back from 20 0 points  Months in reverse 0 points  Repeat phrase 0 points  Total Score 0 points    Immunizations  There is no immunization history on file for this patient.  TDAP status: Due, Education has been provided regarding the importance of this vaccine. Advised may receive this vaccine at local pharmacy or Health Dept. Aware to provide a copy of the vaccination record if obtained from local pharmacy or Health Dept. Verbalized acceptance and understanding.  Flu Vaccine status: Up to date  Pneumococcal vaccine status: Due, Education has been provided regarding the importance of this vaccine. Advised may receive this vaccine at local pharmacy or Health Dept. Aware to provide a copy of the vaccination record if obtained from local pharmacy or Health Dept. Verbalized acceptance and understanding.  Covid-19 vaccine status: Information provided on how to obtain vaccines.   Qualifies for Shingles Vaccine? No   Zostavax completed No   Shingrix Completed?: No.    Education has been provided regarding the importance of this vaccine. Patient has been advised to call insurance company to determine out of pocket expense if they have not yet received this vaccine. Advised may also receive vaccine at local pharmacy or Health Dept. Verbalized acceptance and understanding.  Screening Tests Health Maintenance  Topic Date Due   COVID-19 Vaccine (1) Never done    FOOT EXAM  Never done   OPHTHALMOLOGY EXAM  Never done   Hepatitis C Screening  Never done   DTaP/Tdap/Td (1 - Tdap) Never done   Colonoscopy  Never done   Zoster Vaccines- Shingrix (1 of 2) Never done   INFLUENZA VACCINE  03/17/2023   HEMOGLOBIN A1C  05/19/2023   Diabetic kidney evaluation - eGFR measurement  01/19/2024   Diabetic kidney evaluation - Urine ACR  01/19/2024   Medicare Annual Wellness (AWV)  02/16/2024   HIV Screening  Completed   HPV VACCINES  Aged Out    Health Maintenance  Health Maintenance Due  Topic Date Due   COVID-19 Vaccine (1) Never done   FOOT EXAM  Never done   OPHTHALMOLOGY EXAM  Never done   Hepatitis C Screening  Never done   DTaP/Tdap/Td (1 - Tdap) Never done   Colonoscopy  Never done   Zoster Vaccines- Shingrix (1 of 2) Never done      Lung Cancer Screening: (Low Dose CT Chest recommended if Age 65-80 years, 20 pack-year currently smoking OR have quit w/in 15years.) does not qualify.   Lung Cancer Screening Referral:  N/A  Additional Screening:  Hepatitis C Screening: does not qualify; Completed N/A  Vision Screening: Recommended annual ophthalmology exams for early detection of glaucoma and other disorders of the eye. Is the patient up to date with their annual eye exam?  Yes  Who is the provider or what is the name of the office in which the patient attends annual eye exams? N/A If pt is not established with a provider, would they like to be referred to a provider to establish care? No .   Dental Screening: Recommended annual dental exams for proper oral hygiene  Diabetic Foot Exam: Diabetic Foot Exam: Overdue, Pt has been advised about the importance in completing this exam. Pt is scheduled for diabetic foot exam on N/A.  Community Resource Referral / Chronic Care Management: CRR required this visit?  No   CCM required this visit?  No    Plan:     I have personally reviewed and noted the following in the patient's chart:    Medical and social history Use of alcohol, tobacco or illicit drugs  Current medications and supplements including opioid prescriptions. Patient is not currently taking opioid prescriptions. Functional ability and status Nutritional status Physical activity Advanced directives List of other physicians Hospitalizations, surgeries, and ER visits in previous 12 months Vitals Screenings to include cognitive, depression, and falls Referrals and appointments  In addition, I have reviewed and discussed with patient certain preventive protocols, quality metrics, and best practice recommendations. A written personalized care plan for preventive services as well as general preventive health recommendations were provided to patient.     Cala Bradford, CMA   02/16/2023   After Visit Summary: (Mail) Due to this being a telephonic visit, the after visit summary with patients personalized plan was offered to patient via mail   Nurse Notes: Face-To-Face Visit  Mr. Mcburnie , Thank you for taking time to come for your Medicare Wellness Visit. I appreciate your ongoing commitment to your health goals. Please review the following plan we discussed and let me know if I can assist you in the future.   These are the goals we discussed:  Goals   None     This is a list of the screening recommended for you and due dates:  Health Maintenance  Topic Date Due   COVID-19 Vaccine (1) Never done   Complete foot exam   Never done   Eye exam for diabetics  Never done   Hepatitis C Screening  Never done   DTaP/Tdap/Td vaccine (1 - Tdap) Never done   Colon Cancer Screening  Never done   Zoster (Shingles) Vaccine (1 of 2) Never done   Flu Shot  03/17/2023   Hemoglobin A1C  05/19/2023   Yearly kidney function blood test for diabetes  01/19/2024   Yearly kidney health urinalysis for diabetes  01/19/2024   Medicare Annual Wellness Visit  02/16/2024   HIV Screening  Completed   HPV Vaccine  Aged Out

## 2023-02-16 NOTE — Progress Notes (Unsigned)
Subjective:  CC: diabetes follow-up  HPI:  Andres Richardson is a 57 y.o. male with a past medical history stated below and presents today for follow-up on diabetes. Please see problem based assessment and plan for additional details.  Past Medical History:  Diagnosis Date   CAD (coronary artery disease) 2022     Prox RCA lesion is 100% stenosed.   Prox Cx to Mid Cx lesion is 60% stenosed.   LPAV lesion is 60% stenosed.   Ost LAD to Prox LAD lesion is 40% stenosed.   Diabetes mellitus without complication (HCC)    Type !! 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 Diabetic neuropathy.   Fournier gangrene 2022   Grade I diastolic dysfunction 12/02/2021   History of creation of ostomy [facility])    Hyperlipidemia    Mixed   Hypertension    Essential   Obesity    Peripheral arterial disease (HCC)    occluded left common iliac artery   Proteinuria    Tobacco use disorder     Current Outpatient Medications on File Prior to Visit  Medication Sig Dispense Refill   acetaminophen (TYLENOL) 500 MG tablet Take 1,000-1,500 mg by mouth 2 (two) times daily as needed for moderate pain.     atorvastatin (LIPITOR) 40 MG tablet Take 1 tablet (40 mg total) by mouth daily. 90 tablet 1   calcium carbonate (TUMS - DOSED IN MG ELEMENTAL CALCIUM) 500 MG chewable tablet Chew 1 tablet by mouth at bedtime.     Continuous Glucose Sensor (DEXCOM G7 SENSOR) MISC USE AS DIRECTED ONCE A WEEK 3 each 0   furosemide (LASIX) 40 MG tablet Take 1 tablet (40 mg total) by mouth daily. 90 tablet 1   gabapentin (NEURONTIN) 100 MG capsule Take 1 capsule (100 mg total) by mouth 3 (three) times daily. 90 capsule 1   gentamicin ointment (GARAMYCIN) 0.1 % Apply 1 Application topically daily. 30 g 3   Homeopathic Products (LEG CRAMPS) TABS Take 3 tablets by mouth daily as needed (leg cramps).     Insulin Pen Needle (PEN NEEDLES) 32G X 4 MM MISC 1 Units by Does not apply route 4 (four) times daily. 200 each 3   Iron,  Ferrous Sulfate, 325 (65 Fe) MG TABS Take 1 tablet by mouth daily.     isosorbide mononitrate (IMDUR) 30 MG 24 hr tablet Take 3 tablets (90 mg total) by mouth daily. 270 tablet 3   lidocaine (LIDODERM) 5 % Place 1 patch onto the skin daily. Remove & Discard patch within 12 hours or as directed by MD 30 patch 0   losartan (COZAAR) 25 MG tablet Take 0.5 tablets (12.5 mg total) by mouth daily. 45 tablet 2   metFORMIN (GLUCOPHAGE) 1000 MG tablet Take 1 tablet (1,000 mg total) by mouth 2 (two) times daily with a meal. 180 tablet 1   metoprolol succinate (TOPROL-XL) 25 MG 24 hr tablet Take 1 tablet (25 mg total) by mouth daily. 90 tablet 1   montelukast (SINGULAIR) 10 MG tablet Take 1 tablet (10 mg total) by mouth daily. 30 tablet 2   Multiple Vitamin (MULTIVITAMIN WITH MINERALS) TABS tablet Take 1 tablet by mouth daily. 90 tablet 1   pantoprazole (PROTONIX) 40 MG tablet Take 1 tablet (40 mg total) by mouth daily. 90 tablet 3   sertraline (ZOLOFT) 25 MG tablet Take 1 tablet (25 mg total) by mouth daily. 30 tablet 2   No current facility-administered medications on file prior to visit.  Family History  Problem Relation Age of Onset   CAD Mother    Colon cancer Father     Social History   Socioeconomic History   Marital status: Single    Spouse name: Not on file   Number of children: Not on file   Years of education: Not on file   Highest education level: Not on file  Occupational History   Not on file  Tobacco Use   Smoking status: Every Day    Packs/day: .25    Types: Cigarettes   Smokeless tobacco: Never   Tobacco comments:    5 cigs per day/Uses Nicorette patch  Vaping Use   Vaping Use: Never used  Substance and Sexual Activity   Alcohol use: Never   Drug use: Never   Sexual activity: Not on file  Other Topics Concern   Not on file  Social History Narrative   Not on file   Social Determinants of Health   Financial Resource Strain: Low Risk  (02/16/2023)   Overall  Financial Resource Strain (CARDIA)    Difficulty of Paying Living Expenses: Not hard at all  Food Insecurity: No Food Insecurity (02/16/2023)   Hunger Vital Sign    Worried About Running Out of Food in the Last Year: Never true    Ran Out of Food in the Last Year: Never true  Transportation Needs: No Transportation Needs (02/16/2023)   PRAPARE - Administrator, Civil Service (Medical): No    Lack of Transportation (Non-Medical): No  Physical Activity: Insufficiently Active (02/16/2023)   Exercise Vital Sign    Days of Exercise per Week: 3 days    Minutes of Exercise per Session: 20 min  Stress: No Stress Concern Present (02/16/2023)   [employer] of Occupational Health - Occupational Stress Questionnaire    Feeling of Stress : Not at all  Social Connections: Moderately Isolated (02/16/2023)   Social Connection and Isolation Panel [NHANES]    Frequency of Communication with Friends and Family: More than three times a week    Frequency of Social Gatherings with Friends and Family: Three times a week    Attends Religious Services: 1 to 4 times per year    Active Member of Clubs or Organizations: No    Attends Banker Meetings: Never    Marital Status: Divorced  Catering manager Violence: Not At Risk (02/16/2023)   Humiliation, Afraid, Rape, and Kick questionnaire    Fear of Current or Ex-Partner: No    Emotionally Abused: No    Physically Abused: No    Sexually Abused: No    Review of Systems: ROS negative except for what is noted on the assessment and plan.  Objective:   Vitals:   02/16/23 1437  BP: (!) 122/56  Pulse: 82  Temp: 98 F (36.7 C)  TempSrc: Oral  SpO2: 100%  Weight: 216 lb (98 kg)  Height: 5\' 11"  (1.803 m)    Physical Exam: Constitutional: well-appearing Cardiovascular: regular rate and rhythm, no m/r/g Pulmonary/Chest: normal work of breathing on room air, lungs clear to auscultation bilaterally Abdominal: ostomy in place, well  perfused stoma MSK: normal bulk and tone  Assessment & Plan:  Diabetic kidney disease (HCC) He continues to take losartan 12.5 mg. Blood pressure limits increasing dose as BP is 122/56. He will establish care with [facility] in August.   Uncontrolled type 2 diabetes mellitus with hyperglycemia, with long-term current use of insulin (HCC) He was not able to pick  up Dexcom sensors as [employer] told him insurance did not cover these. He already has the receiver. His old glucometer kept showing an error sign when he tried to check over the last few day He denies polyuria and polydipsia. A1c improved from 10.2 to 9.6. To get colostomy reversal A1c will need to be less than 8. He decreased long acting as had morning sugar in 50s a weeks ago. A: Unfortunately other PO options limits in setting of ketosis prone DM and hx of fourniers gangrene. He tried GLP1 in past and didn't tolerate due to appetite changes. Without blood sugar data, I can not adjust regimen today. P: Glucometer sent in. Lupita Leash advised completing DME form for sensors and that was done. I will also reach out to [facility] to see if other barrier for Dexcom sensors. -continue lantus 43 units Humalog 11 units TID and metformin 1000 mg bid -f/u in 1 onth -establishing care with endocrine with Dr. Roosevelt Locks in August 12.  Hyperlipidemia Lab Results  Component Value Date   CHOL 111 05/13/2021   HDL 28 (L) 05/13/2021   LDLCALC 44 05/13/2021   TRIG 197 (H) 05/13/2021   CHOLHDL 4.0 05/13/2021  Last LDL at goal for secondary prevention. He is adherent with lipitor 40 mg. P: Recheck lipid panel  Thyroid nodule greater than or equal to 1.5 cm in diameter incidentally noted on imaging study 1. Enlarged, heterogeneous and multinodular thyroid gland. 2. Nodule # 2 is a 1.7 cm TI-RADS category 4 nodule in the right mid to lower gland and meets criteria to consider fine-needle aspiration biopsy. Biopsy is recommended. 3. Nodule # 3 is a  2.1 cm TI-RADS category 3 nodule in the right superior gland meets criteria for imaging surveillance. Recommend follow-up ultrasound in 1 year.  Biopsy ordered, scheduled for 03/30/23 -needs 1 year surveillance on right superior gland    Patient discussed with Dr. Dionne Bucy Laporcha Marchesi, D.O. [facility]  PGY-3 Pager: [phone]  Phone: [phone] Date 02/17/2023  Time 1:01 PM

## 2023-02-16 NOTE — Patient Instructions (Signed)
Thank you, Mr.Nimrod V Montroy for allowing [name] to provide your care today.  Diabetes I sent in glucometer and will check with pharmacy tech about how to get sensors for you. Please follow-up in 4 weeks with glucometer and CGM. The only way that I can adjust your insulin is by looking at your blood sugars.   Thyroid U/S  Biopsy in August.  I have ordered the following labs for you:  Lab Orders         Lipid Profile         POC Hbg A1C       I have ordered the following medication/changed the following medications:   Stop the following medications: Medications Discontinued During This Encounter  Medication Reason   aspirin EC 81 MG tablet Reorder   DULoxetine (CYMBALTA) 60 MG capsule Reorder     Start the following medications: Meds ordered this encounter  Medications   aspirin EC 81 MG tablet    Sig: Take 1 tablet (81 mg total) by mouth daily. Swallow whole.    Dispense:  90 tablet    Refill:  3   DULoxetine (CYMBALTA) 60 MG capsule    Sig: Take 1 capsule (60 mg total) by mouth daily.    Dispense:  90 capsule    Refill:  3   Blood Glucose Monitoring Suppl DEVI    Sig: May substitute to any manufacturer covered by patient's insurance.    Dispense:  1 each    Refill:  0   Glucose Blood (BLOOD GLUCOSE TEST STRIPS) STRP    Sig: 1 each by In Vitro route in the morning, at noon, and at bedtime. May substitute to any manufacturer covered by patient's insurance.    Dispense:  300 strip    Refill:  3   Lancet Device MISC    Sig: 1 each by Does not apply route in the morning, at noon, and at bedtime. May substitute to any manufacturer covered by patient's insurance.    Dispense:  1 each    Refill:  0   Lancets Misc. MISC    Sig: 1 each by Does not apply route in the morning, at noon, and at bedtime. May substitute to any manufacturer covered by patient's insurance.    Dispense:  300 each    Refill:  0     Follow up:  4 weeks    We look forward to seeing you next time.  Please call our clinic at [phone] if you have any questions or concerns. The best time to call is Monday-Friday from 9am-4pm, but there is someone available 24/7. If after hours or the weekend, call the main hospital number and ask for the Internal Medicine Resident On-Call. If you need medication refills, please notify your pharmacy one week in advance and they will send [name] a request.   Thank you for trusting me with your care. Wishing you the best!   Rudene Christians, DO [facility]

## 2023-02-17 MED ORDER — NITROGLYCERIN 0.4 MG SL SUBL
0.4000 mg | SUBLINGUAL_TABLET | SUBLINGUAL | 3 refills | Status: AC | PRN
Start: 2023-02-17 — End: ?

## 2023-02-17 MED ORDER — INSULIN LISPRO (1 UNIT DIAL) 100 UNIT/ML (KWIKPEN)
11.0000 [IU] | PEN_INJECTOR | Freq: Three times a day (TID) | SUBCUTANEOUS | 4 refills | Status: DC
Start: 2023-02-17 — End: 2023-06-04

## 2023-02-17 MED ORDER — INSULIN GLARGINE 100 UNIT/ML SOLOSTAR PEN
43.0000 [IU] | PEN_INJECTOR | Freq: Every day | SUBCUTANEOUS | 11 refills | Status: DC
Start: 2023-02-17 — End: 2023-03-16

## 2023-02-17 NOTE — Assessment & Plan Note (Signed)
Lab Results  Component Value Date   CHOL 111 05/13/2021   HDL 28 (L) 05/13/2021   LDLCALC 44 05/13/2021   TRIG 197 (H) 05/13/2021   CHOLHDL 4.0 05/13/2021  Last LDL at goal for secondary prevention. He is adherent with lipitor 40 mg. P: Recheck lipid panel

## 2023-02-17 NOTE — Assessment & Plan Note (Signed)
He was not able to pick up Dexcom sensors as [facility] told him insurance did not cover these. He already has the receiver. His old glucometer kept showing an error sign when he tried to check over the last few day He denies polyuria and polydipsia. A1c improved from 10.2 to 9.6. To get colostomy reversal A1c will need to be less than 8. He decreased long acting as had morning sugar in 50s a weeks ago. A: Unfortunately other PO options limits in setting of ketosis prone DM and hx of fourniers gangrene. He tried GLP1 in past and didn't tolerate due to appetite changes. Without blood sugar data, I can not adjust regimen today. P: Glucometer sent in. Lupita Leash advised completing DME form for sensors and that was done. I will also reach out to [facility] to see if other barrier for Dexcom sensors. -continue lantus 43 units Humalog 11 units TID and metformin 1000 mg bid -f/u in 1 onth -establishing care with endocrine with Dr. Roosevelt Locks in August 12.

## 2023-02-17 NOTE — Assessment & Plan Note (Signed)
He continues to take losartan 12.5 mg. Blood pressure limits increasing dose as BP is 122/56. He will establish care with [facility] in August.

## 2023-02-17 NOTE — Assessment & Plan Note (Signed)
1. Enlarged, heterogeneous and multinodular thyroid gland. 2. Nodule # 2 is a 1.7 cm TI-RADS category 4 nodule in the right mid to lower gland and meets criteria to consider fine-needle aspiration biopsy. Biopsy is recommended. 3. Nodule # 3 is a 2.1 cm TI-RADS category 3 nodule in the right superior gland meets criteria for imaging surveillance. Recommend follow-up ultrasound in 1 year.  Biopsy ordered, scheduled for 03/30/23 -needs 1 year surveillance on right superior gland

## 2023-02-17 NOTE — Assessment & Plan Note (Signed)
>>  ASSESSMENT AND PLAN FOR DIABETIC KIDNEY DISEASE (HCC) WRITTEN ON 02/17/2023 12:46 PM BY [name], [name], DO  He continues to take losartan  12.5 mg. Blood pressure limits increasing dose as BP is 122/56. He will establish care with [facility] in August.

## 2023-02-18 ENCOUNTER — Inpatient Hospital Stay (HOSPITAL_COMMUNITY)
Admission: EM | Admit: 2023-02-18 | Discharge: 2023-02-20 | DRG: 291 | Disposition: A | Discharge status: Home / Self Care | Payer: Medicare Other | Attending: Student in an Organized Health Care Education/Training Program | Admitting: Student in an Organized Health Care Education/Training Program

## 2023-02-18 ENCOUNTER — Other Ambulatory Visit: Payer: Self-pay

## 2023-02-18 ENCOUNTER — Encounter (HOSPITAL_COMMUNITY): Payer: Self-pay

## 2023-02-18 ENCOUNTER — Emergency Department (HOSPITAL_COMMUNITY): Payer: Medicare Other

## 2023-02-18 ENCOUNTER — Inpatient Hospital Stay (HOSPITAL_COMMUNITY): Payer: Medicare Other

## 2023-02-18 ENCOUNTER — Other Ambulatory Visit (HOSPITAL_COMMUNITY): Payer: Self-pay

## 2023-02-18 ENCOUNTER — Encounter: Payer: Self-pay | Admitting: Internal Medicine

## 2023-02-18 DIAGNOSIS — J811 Chronic pulmonary edema: Secondary | ICD-10-CM

## 2023-02-18 DIAGNOSIS — Z79899 Other long term (current) drug therapy: Secondary | ICD-10-CM | POA: Diagnosis not present

## 2023-02-18 DIAGNOSIS — Z933 Colostomy status: Secondary | ICD-10-CM

## 2023-02-18 DIAGNOSIS — I7409 Other arterial embolism and thrombosis of abdominal aorta: Secondary | ICD-10-CM | POA: Diagnosis present

## 2023-02-18 DIAGNOSIS — Z794 Long term (current) use of insulin: Secondary | ICD-10-CM | POA: Diagnosis not present

## 2023-02-18 DIAGNOSIS — Z91048 Other nonmedicinal substance allergy status: Secondary | ICD-10-CM | POA: Diagnosis not present

## 2023-02-18 DIAGNOSIS — I5031 Acute diastolic (congestive) heart failure: Secondary | ICD-10-CM | POA: Diagnosis not present

## 2023-02-18 DIAGNOSIS — N179 Acute kidney failure, unspecified: Secondary | ICD-10-CM | POA: Diagnosis present

## 2023-02-18 DIAGNOSIS — R072 Precordial pain: Secondary | ICD-10-CM

## 2023-02-18 DIAGNOSIS — J810 Acute pulmonary edema: Secondary | ICD-10-CM | POA: Diagnosis present

## 2023-02-18 DIAGNOSIS — I252 Old myocardial infarction: Secondary | ICD-10-CM

## 2023-02-18 DIAGNOSIS — I5033 Acute on chronic diastolic (congestive) heart failure: Secondary | ICD-10-CM | POA: Diagnosis present

## 2023-02-18 DIAGNOSIS — Z7984 Long term (current) use of oral hypoglycemic drugs: Secondary | ICD-10-CM

## 2023-02-18 DIAGNOSIS — I10 Essential (primary) hypertension: Secondary | ICD-10-CM

## 2023-02-18 DIAGNOSIS — N049 Nephrotic syndrome with unspecified morphologic changes: Secondary | ICD-10-CM | POA: Diagnosis present

## 2023-02-18 DIAGNOSIS — E1122 Type 2 diabetes mellitus with diabetic chronic kidney disease: Secondary | ICD-10-CM | POA: Diagnosis present

## 2023-02-18 DIAGNOSIS — E785 Hyperlipidemia, unspecified: Secondary | ICD-10-CM | POA: Diagnosis present

## 2023-02-18 DIAGNOSIS — E1151 Type 2 diabetes mellitus with diabetic peripheral angiopathy without gangrene: Secondary | ICD-10-CM | POA: Diagnosis present

## 2023-02-18 DIAGNOSIS — I214 Non-ST elevation (NSTEMI) myocardial infarction: Secondary | ICD-10-CM

## 2023-02-18 DIAGNOSIS — N1831 Chronic kidney disease, stage 3a: Secondary | ICD-10-CM | POA: Diagnosis present

## 2023-02-18 DIAGNOSIS — Z8249 Family history of ischemic heart disease and other diseases of the circulatory system: Secondary | ICD-10-CM | POA: Diagnosis not present

## 2023-02-18 DIAGNOSIS — M7989 Other specified soft tissue disorders: Secondary | ICD-10-CM

## 2023-02-18 DIAGNOSIS — I745 Embolism and thrombosis of iliac artery: Secondary | ICD-10-CM | POA: Diagnosis present

## 2023-02-18 DIAGNOSIS — I130 Hypertensive heart and chronic kidney disease with heart failure and stage 1 through stage 4 chronic kidney disease, or unspecified chronic kidney disease: Principal | ICD-10-CM | POA: Diagnosis present

## 2023-02-18 DIAGNOSIS — E119 Type 2 diabetes mellitus without complications: Secondary | ICD-10-CM | POA: Diagnosis not present

## 2023-02-18 DIAGNOSIS — F17210 Nicotine dependence, cigarettes, uncomplicated: Secondary | ICD-10-CM | POA: Diagnosis present

## 2023-02-18 DIAGNOSIS — Z7982 Long term (current) use of aspirin: Secondary | ICD-10-CM | POA: Diagnosis not present

## 2023-02-18 DIAGNOSIS — I2510 Atherosclerotic heart disease of native coronary artery without angina pectoris: Secondary | ICD-10-CM | POA: Diagnosis not present

## 2023-02-18 DIAGNOSIS — I5032 Chronic diastolic (congestive) heart failure: Secondary | ICD-10-CM | POA: Diagnosis not present

## 2023-02-18 HISTORY — DX: Acute pulmonary edema: J81.0

## 2023-02-18 LAB — BASIC METABOLIC PANEL
Anion gap: 13 (ref 5–15)
Anion gap: 9 (ref 5–15)
BUN: 31 mg/dL — ABNORMAL HIGH (ref 6–20)
BUN: 27 mg/dL — ABNORMAL HIGH (ref 6–20)
CO2: 22 mmol/L (ref 22–32)
CO2: 23 mmol/L (ref 22–32)
Calcium: 8.5 mg/dL — ABNORMAL LOW (ref 8.9–10.3)
Calcium: 7.9 mg/dL — ABNORMAL LOW (ref 8.9–10.3)
Chloride: 105 mmol/L (ref 98–111)
Chloride: 105 mmol/L (ref 98–111)
Creatinine, Ser: 1.66 mg/dL — ABNORMAL HIGH (ref 0.61–1.24)
Creatinine, Ser: 1.57 mg/dL — ABNORMAL HIGH (ref 0.61–1.24)
GFR, Estimated: 48 mL/min — ABNORMAL LOW (ref >60)
GFR, Estimated: 51 mL/min — ABNORMAL LOW (ref >60)
Glucose, Bld: 295 mg/dL — ABNORMAL HIGH (ref 70–99)
Glucose, Bld: 289 mg/dL — ABNORMAL HIGH (ref 70–99)
Potassium: 4.3 mmol/L (ref 3.5–5.1)
Potassium: 3.8 mmol/L (ref 3.5–5.1)
Sodium: 140 mmol/L (ref 135–145)
Sodium: 137 mmol/L (ref 135–145)

## 2023-02-18 LAB — CBC WITH DIFFERENTIAL/PLATELET
Abs Immature Granulocytes: 0.03 10*3/uL (ref 0.00–0.07)
Basophils Absolute: 0.1 10*3/uL (ref 0.0–0.1)
Basophils Relative: 1 %
Eosinophils Absolute: 0.3 10*3/uL (ref 0.0–0.5)
Eosinophils Relative: 3 %
HCT: 40.6 % (ref 39.0–52.0)
Hemoglobin: 12.9 g/dL — ABNORMAL LOW (ref 13.0–17.0)
Immature Granulocytes: 0 %
Lymphocytes Relative: 22 %
Lymphs Abs: 1.8 10*3/uL (ref 0.7–4.0)
MCH: 28.3 pg (ref 26.0–34.0)
MCHC: 31.8 g/dL (ref 30.0–36.0)
MCV: 89 fL (ref 80.0–100.0)
Monocytes Absolute: 0.4 10*3/uL (ref 0.1–1.0)
Monocytes Relative: 5 %
Neutro Abs: 5.6 10*3/uL (ref 1.7–7.7)
Neutrophils Relative %: 69 %
Platelets: 221 10*3/uL (ref 150–400)
RBC: 4.56 MIL/uL (ref 4.22–5.81)
RDW: 14.5 % (ref 11.5–15.5)
WBC: 8.2 10*3/uL (ref 4.0–10.5)
nRBC: 0 % (ref 0.0–0.2)

## 2023-02-18 LAB — CBC
HCT: 34.9 % — ABNORMAL LOW (ref 39.0–52.0)
Hemoglobin: 11.4 g/dL — ABNORMAL LOW (ref 13.0–17.0)
MCH: 28.1 pg (ref 26.0–34.0)
MCHC: 32.7 g/dL (ref 30.0–36.0)
MCV: 86 fL (ref 80.0–100.0)
Platelets: 209 10*3/uL (ref 150–400)
RBC: 4.06 MIL/uL — ABNORMAL LOW (ref 4.22–5.81)
RDW: 14.4 % (ref 11.5–15.5)
WBC: 7.2 10*3/uL (ref 4.0–10.5)
nRBC: 0 % (ref 0.0–0.2)

## 2023-02-18 LAB — LIPID PANEL
Chol/HDL Ratio: 4 ratio (ref 0.0–5.0)
Cholesterol, Total: 136 mg/dL (ref 100–199)
HDL: 34 mg/dL — ABNORMAL LOW (ref >39)
LDL Chol Calc (NIH): 78 mg/dL (ref 0–99)
Triglycerides: 132 mg/dL (ref 0–149)
VLDL Cholesterol Cal: 24 mg/dL (ref 5–40)

## 2023-02-18 LAB — TROPONIN I (HIGH SENSITIVITY)
Troponin I (High Sensitivity): 20 ng/L — ABNORMAL HIGH (ref <18)
Troponin I (High Sensitivity): 53 ng/L — ABNORMAL HIGH (ref <18)
Troponin I (High Sensitivity): 45 ng/L — ABNORMAL HIGH (ref <18)
Troponin I (High Sensitivity): 42 ng/L — ABNORMAL HIGH (ref <18)

## 2023-02-18 LAB — GLUCOSE, CAPILLARY
Glucose-Capillary: 179 mg/dL — ABNORMAL HIGH (ref 70–99)
Glucose-Capillary: 258 mg/dL — ABNORMAL HIGH (ref 70–99)
Glucose-Capillary: 114 mg/dL — ABNORMAL HIGH (ref 70–99)

## 2023-02-18 LAB — BRAIN NATRIURETIC PEPTIDE: B Natriuretic Peptide: 134.8 pg/mL — ABNORMAL HIGH (ref 0.0–100.0)

## 2023-02-18 LAB — MAGNESIUM: Magnesium: 1.6 mg/dL — ABNORMAL LOW (ref 1.7–2.4)

## 2023-02-18 LAB — D-DIMER, QUANTITATIVE: D-Dimer, Quant: 0.76 ug/mL-FEU — ABNORMAL HIGH (ref 0.00–0.50)

## 2023-02-18 MED ORDER — FUROSEMIDE 10 MG/ML IJ SOLN
40.0000 mg | Freq: Once | INTRAMUSCULAR | Status: AC
  Administered 2023-02-18: 40 mg via INTRAVENOUS
  Filled 2023-02-18: qty 4

## 2023-02-18 MED ORDER — ROSUVASTATIN CALCIUM 20 MG PO TABS
20.0000 mg | ORAL_TABLET | Freq: Every day | ORAL | Status: DC
  Administered 2023-02-18 – 2023-02-20 (×3): 20 mg via ORAL
  Filled 2023-02-18 (×3): qty 1

## 2023-02-18 MED ORDER — ENOXAPARIN SODIUM 40 MG/0.4ML IJ SOSY
40.0000 mg | PREFILLED_SYRINGE | INTRAMUSCULAR | Status: DC
  Administered 2023-02-18 – 2023-02-19 (×2): 40 mg via SUBCUTANEOUS
  Filled 2023-02-18 (×3): qty 0.4

## 2023-02-18 MED ORDER — NITROGLYCERIN 2 % TD OINT
1.0000 [in_us] | TOPICAL_OINTMENT | Freq: Once | TRANSDERMAL | Status: AC
  Administered 2023-02-18: 1 [in_us] via TOPICAL
  Filled 2023-02-18: qty 1

## 2023-02-18 MED ORDER — ACETAMINOPHEN 650 MG RE SUPP: 650.0000 mg | Freq: Four times a day (QID) | RECTAL | Status: DC | PRN

## 2023-02-18 MED ORDER — SENNOSIDES-DOCUSATE SODIUM 8.6-50 MG PO TABS: 1.0000 | ORAL_TABLET | Freq: Every evening | ORAL | Status: DC | PRN

## 2023-02-18 MED ORDER — INSULIN ASPART 100 UNIT/ML IJ SOLN
0.0000 [IU] | Freq: Every day | INTRAMUSCULAR | Status: DC
  Administered 2023-02-19: 2 [IU] via SUBCUTANEOUS

## 2023-02-18 MED ORDER — MAGNESIUM SULFATE 4 GM/100ML IV SOLN
4.0000 g | Freq: Once | INTRAVENOUS | Status: AC
  Administered 2023-02-18: 4 g via INTRAVENOUS
  Filled 2023-02-18 (×2): qty 100

## 2023-02-18 MED ORDER — ACETAMINOPHEN 325 MG PO TABS: 650.0000 mg | ORAL_TABLET | Freq: Four times a day (QID) | ORAL | Status: DC | PRN

## 2023-02-18 MED ORDER — INSULIN GLARGINE-YFGN 100 UNIT/ML ~~LOC~~ SOLN
21.0000 [IU] | Freq: Every day | SUBCUTANEOUS | Status: DC
  Administered 2023-02-18 – 2023-02-20 (×3): 21 [IU] via SUBCUTANEOUS
  Filled 2023-02-18 (×4): qty 0.21

## 2023-02-18 MED ORDER — IOHEXOL 350 MG/ML SOLN
75.0000 mL | Freq: Once | INTRAVENOUS | Status: AC | PRN
  Administered 2023-02-18: 75 mL via INTRAVENOUS

## 2023-02-18 MED ORDER — INSULIN ASPART 100 UNIT/ML IJ SOLN
0.0000 [IU] | Freq: Three times a day (TID) | INTRAMUSCULAR | Status: DC
  Administered 2023-02-18: 11 [IU] via SUBCUTANEOUS
  Administered 2023-02-19: 3 [IU] via SUBCUTANEOUS
  Administered 2023-02-19: 7 [IU] via SUBCUTANEOUS
  Administered 2023-02-19: 4 [IU] via SUBCUTANEOUS
  Administered 2023-02-20: 15 [IU] via SUBCUTANEOUS
  Administered 2023-02-20: 3 [IU] via SUBCUTANEOUS

## 2023-02-18 NOTE — ED Notes (Signed)
Next trop is 1359 due to me being late on drawing the second one. Re modified  the order for correct times.

## 2023-02-18 NOTE — ED Notes (Signed)
ED TO INPATIENT HANDOFF REPORT  ED Nurse Name and Phone #: Aquila Menzie [phone]  S Name/Age/Gender Andres Richardson 57 y.o. male Room/Bed: 027C/027C  Code Status   Code Status: Full Code  Home/SNF/Other Home Patient oriented to: self, place, time, and situation Is this baseline? Yes   Triage Complete: Triage complete  Chief Complaint Flash pulmonary edema (HCC) [J81.0]  Triage Note PT BIBEMS from home w/ c/o SOB, chest pain. As per pt he woke up approx 0230 w/ sx  and took a nitro w/ no relief   Allergies Allergies  Allergen Reactions   Adhesive [Tape] Rash    Level of Care/Admitting Diagnosis ED Disposition     ED Disposition  Admit   Condition  --   Comment  Hospital Area: [facility] [[id number]]  Level of Care: Telemetry Medical [104]  May admit patient to [facility] or [facility] if equivalent level of care is available:: Yes  Covid Evaluation: Symptomatic Person Under Investigation (PUI) or recent exposure (last 10 days) *Testing Required*  Diagnosis: Flash pulmonary edema [facility]) [[id number]]  Admitting Physician: Nena Polio  Attending Physician: Inez Catalina [phone]  Certification:: I certify this patient will need inpatient services for at least 2 midnights  Estimated Length of Stay: 3          B Medical/Surgery History Past Medical History:  Diagnosis Date   CAD (coronary artery disease) 2022     Prox RCA lesion is 100% stenosed.   Prox Cx to Mid Cx lesion is 60% stenosed.   LPAV lesion is 60% stenosed.   Ost LAD to Prox LAD lesion is 40% stenosed.   Diabetes mellitus without complication (HCC)    Type !! 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 Diabetic neuropathy.   Fournier gangrene 2022   Grade I diastolic dysfunction 12/02/2021   History of creation of ostomy [facility])    Hyperlipidemia    Mixed   Hypertension    Essential   Obesity    Peripheral arterial disease (HCC)    occluded left common iliac  artery   Proteinuria    Tobacco use disorder    Past Surgical History:  Procedure Laterality Date   BIOPSY  12/04/2021   Procedure: BIOPSY;  Surgeon: Kerin Salen, MD;  Location: [facility] ENDOS[facility];  Service: Gastroenterology;;   ESOPHAGOGASTRODUODENOSCOPY (EGD) WITH PROPOFOL N/A 12/04/2021   Procedure: ESOPHAGOGASTRODUODENOSCOPY (EGD) WITH PROPOFOL;  Surgeon: Kerin Salen, MD;  Location: [facility]OS[facility];  Service: Gastroenterology;  Laterality: N/A;   INCISION AND DRAINAGE ABSCESS N/A 02/24/2021   Procedure: INCISION AND DRAINAGE PERINEUM;  Surgeon: Sheliah Hatch, De Blanch, MD;  Location: [facility] ORS;  Service: General;  Laterality: N/A;   INCISION AND DRAINAGE ABSCESS N/A 02/25/2021   Procedure: REPEAT WASHOUT OF PERINEUM;  Surgeon: Sheliah Hatch De Blanch, MD;  Location: [facility] ORS;  Service: General;  Laterality: N/A;   LAPAROSCOPIC DIVERTED COLOSTOMY N/A 02/25/2021   Procedure: LAPAROSCOPIC DIVERTING SIGMOID COLOSTOMY;  Surgeon: [name], MD;  Location: [facility] ORS;  Service: General;  Laterality: N/A;   LEFT HEART CATH AND CORONARY ANGIOGRAPHY N/A 05/18/2021   Procedure: LEFT HEART CATH AND CORONARY ANGIOGRAPHY;  Surgeon: Runell Gess, MD;  Location: [facility]VAS[facility] CV [facility];  Service: Cardiovascular;  Laterality: N/A;     A IV Location/Drains/Wounds Patient Lines/Drains/Airways Status     Active Line/Drains/Airways     Name Placement date Placement time Site Days   Peripheral IV 02/18/23 20 G Right Antecubital 02/18/23  0925  Antecubital  less than 1   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  0000  LLQ  724            Intake/Output Last 24 hours No intake or output data in the 24 hours ending 02/18/23 1128  Labs/Imaging Results for orders placed or performed during the hospital encounter of 02/18/23 (from the past 48 hour(s))  CBC with Differential     Status: Abnormal   Collection Time: 02/18/23  4:04 AM  Result Value Ref Range   WBC 8.2 4.0 - 10.5 K/uL   RBC 4.56 4.22 - 5.81 MIL/uL   Hemoglobin 12.9  (L) 13.0 - 17.0 g/dL   HCT 16.1 09.6 - 04.5 %   MCV 89.0 80.0 - 100.0 fL   MCH 28.3 26.0 - 34.0 pg   MCHC 31.8 30.0 - 36.0 g/dL   RDW 40.9 81.1 - 91.4 %   Platelets 221 150 - 400 K/uL   nRBC 0.0 0.0 - 0.2 %   Neutrophils Relative % 69 %   Neutro Abs 5.6 1.7 - 7.7 K/uL   Lymphocytes Relative 22 %   Lymphs Abs 1.8 0.7 - 4.0 K/uL   Monocytes Relative 5 %   Monocytes Absolute 0.4 0.1 - 1.0 K/uL   Eosinophils Relative 3 %   Eosinophils Absolute 0.3 0.0 - 0.5 K/uL   Basophils Relative 1 %   Basophils Absolute 0.1 0.0 - 0.1 K/uL   Immature Granulocytes 0 %   Abs Immature Granulocytes 0.03 0.00 - 0.07 K/uL    Comment: Performed at [facility], [address] [place], [place] [address]  Basic metabolic panel     Status: Abnormal   Collection Time: 02/18/23  4:04 AM  Result Value Ref Range   Sodium 140 135 - 145 mmol/L   Potassium 4.3 3.5 - 5.1 mmol/L   Chloride 105 98 - 111 mmol/L   CO2 22 22 - 32 mmol/L   Glucose, Bld 295 (H) 70 - 99 mg/dL    Comment: Glucose reference range applies only to samples taken after fasting for at least 8 hours.   BUN 31 (H) 6 - 20 mg/dL   Creatinine, Ser 6.21 (H) 0.61 - 1.24 mg/dL   Calcium 8.5 (L) 8.9 - 10.3 mg/dL   GFR, Estimated 48 (L) >60 mL/min    Comment: (NOTE) Calculated using the CKD-EPI Creatinine Equation (2021)    Anion gap 13 5 - 15    Comment: Performed at [facility], [address] [place], [place] [address]  Brain natriuretic peptide     Status: Abnormal   Collection Time: 02/18/23  4:04 AM  Result Value Ref Range   B Natriuretic Peptide 134.8 (H) 0.0 - 100.0 pg/mL    Comment: Performed at [facility], [address] [place], [place] [address]  D-dimer, quantitative     Status: Abnormal   Collection Time: 02/18/23  4:04 AM  Result Value Ref Range   D-Dimer, Quant 0.76 (H) 0.00 - 0.50 ug/mL-FEU    Comment: (NOTE) At the manufacturer cut-off value of 0.5 g/mL FEU, this assay has a negative predictive value  of 95-100%.This assay is intended for use in conjunction with a clinical pretest probability (PTP) assessment model to exclude pulmonary embolism (PE) and deep venous thrombosis (DVT) in outpatients suspected of PE or DVT. Results should be correlated with clinical presentation. Performed at [facility], [address], [place], [place] [address]   Troponin I (High Sensitivity)  Status: Abnormal   Collection Time: 02/18/23  4:04 AM  Result Value Ref Range   Troponin I (High Sensitivity) 20 (H) <18 ng/L    Comment: (NOTE) Elevated high sensitivity troponin I (hsTnI) values and significant  changes across serial measurements may suggest ACS but many other  chronic and acute conditions are known to elevate hsTnI results.  Refer to the "Links" section for chest pain algorithms and additional  guidance. Performed at [facility], [address] [place], [place] [address]   Troponin I (High Sensitivity)     Status: Abnormal   Collection Time: 02/18/23  6:14 AM  Result Value Ref Range   Troponin I (High Sensitivity) 53 (H) <18 ng/L    Comment: CRITICAL RESULT CALLED TO, READ BACK BY AND VERIFIED WITH A.Myles Tavella,RN 6045 02/18/23 CLARK,S (NOTE) Elevated high sensitivity troponin I (hsTnI) values and significant  changes across serial measurements may suggest ACS but many other  chronic and acute conditions are known to elevate hsTnI results.  Refer to the "Links" section for chest pain algorithms and additional  guidance. Performed at [facility], [address], [place], [place] [address]    CT Angio Chest PE W and/or Wo Contrast  Result Date: 02/18/2023 CLINICAL DATA:  Short of breath and chest pain. EXAM: CT ANGIOGRAPHY CHEST WITH CONTRAST TECHNIQUE: Multidetector CT imaging of the chest was performed using the standard protocol during bolus administration of intravenous contrast. Multiplanar CT image reconstructions and MIPs were obtained to evaluate the vascular  anatomy. RADIATION DOSE REDUCTION: This exam was performed according to the departmental dose-optimization program which includes automated exposure control, adjustment of the mA and/or kV according to patient size and/or use of iterative reconstruction technique. CONTRAST:  75mL OMNIPAQUE IOHEXOL 350 MG/ML SOLN COMPARISON:  10/12/2022 and 05/10/2021. FINDINGS: Cardiovascular: Pulmonary arteries are well opacified. There is no evidence of a pulmonary embolism. Heart is normal in size and configuration. No pericardial effusion. Three-vessel coronary artery calcifications. Great vessels are normal in caliber. No aortic dissection. Mild aortic atherosclerosis. Mediastinum/Nodes: No neck base, mediastinal or hilar masses. Several prominent lymph nodes. Pre-vascular node measuring 1.1 cm short axis. Subcarinal node measuring 1.5 cm short axis, both larger than on the prior CT. Trachea and esophagus are unremarkable. Lungs/Pleura: Mild bilateral interstitial thickening. Patchy ground-glass opacities are noted predominantly in the lower lobes. Bronchial wall thickening also noted predominantly in the lower lobes. Minor dependent atelectasis in the right lower lobe. Mild paraseptal and centrilobular emphysema most evident in the left upper lobe. No convincing pleural effusion.  No pneumothorax. Upper Abdomen: No acute abnormality. Musculoskeletal: No fracture or acute finding.  No bone lesion. Review of the MIP images confirms the above findings. IMPRESSION: 1. No evidence of a pulmonary embolism. 2. Lung findings with interstitial thickening and patchy ground-glass opacities. Suspect pulmonary edema. Infection should be considered in the proper clinical setting. 3. Three-vessel coronary artery calcifications. Aortic Atherosclerosis (ICD10-I70.0) and Emphysema (ICD10-J43.9). Electronically Signed   By: Amie Portland M.D.   On: 02/18/2023 10:19   DG Chest Portable 1 View  Result Date: 02/18/2023 CLINICAL DATA:  Chest  pain, shortness of breath. EXAM: PORTABLE CHEST 1 VIEW COMPARISON:  10/01/2022. FINDINGS: The heart is mildly enlarged and mediastinal contours are stable. The pulmonary vasculature is distended. Lung volumes are low with patchy airspace disease at the lung bases. No effusion or pneumothorax. No acute osseous abnormality. IMPRESSION: 1. Cardiomegaly with pulmonary vascular congestion. 2. Patchy airspace disease at the lung bases, possible  edema or infiltrate. Electronically Signed   By: Thornell Sartorius M.D.   On: 02/18/2023 04:28    Pending Labs Unresulted Labs (From admission, onward)     Start     Ordered   02/19/23 0500  Basic metabolic panel  Tomorrow morning,   R        02/18/23 1113   02/19/23 0500  CBC  Tomorrow morning,   R        02/18/23 1113   02/18/23 1116  Magnesium  Add-on,   AD        02/18/23 1115   02/18/23 1104  CBC  (enoxaparin (LOVENOX)    CrCl >/= 30 ml/min)  Once,   R       Comments: Baseline for enoxaparin therapy IF NOT ALREADY DRAWN.  Notify MD if PLT < 100 K.    02/18/23 1113            Vitals/Pain Today's Vitals   02/18/23 0845 02/18/23 0900 02/18/23 0915 02/18/23 1007  BP: (!) 153/64 (!) 167/70 121/88   Pulse: 87 88 93   Resp: 20 (!) 26 17   Temp:    98.2 F (36.8 C)  TempSrc:    Oral  SpO2: 96% 95% 100%   Weight:      Height:      PainSc:        Isolation Precautions No active isolations  Medications Medications  enoxaparin (LOVENOX) injection 40 mg (has no administration in time range)  acetaminophen (TYLENOL) tablet 650 mg (has no administration in time range)    Or  acetaminophen (TYLENOL) suppository 650 mg (has no administration in time range)  senna-docusate (Senokot-S) tablet 1 tablet (has no administration in time range)  nitroGLYCERIN (NITROGLYN) 2 % ointment 1 inch (1 inch Topical Given 02/18/23 0440)  furosemide (LASIX) injection 40 mg (40 mg Intravenous Given 02/18/23 0706)  iohexol (OMNIPAQUE) 350 MG/ML injection 75 mL (75 mLs  Intravenous Contrast Given 02/18/23 0942)    Mobility walks with person assist     Focused Assessments    R Recommendations: See Admitting Provider Note  Report given to:   Additional Notes:

## 2023-02-18 NOTE — ED Triage Notes (Signed)
PT BIBEMS from home w/ c/o SOB, chest pain. As per pt he woke up approx 0230 w/ sx  and took a nitro w/ no relief

## 2023-02-18 NOTE — Inpatient Diabetes Management (Addendum)
Inpatient Diabetes Program Recommendations  AACE/ADA: New Consensus Statement on Inpatient Glycemic Control (2015)  Target Ranges:  Prepandial:   less than 140 mg/dL      Peak postprandial:   less than 180 mg/dL (1-2 hours)      Critically ill patients:  140 - 180 mg/dL   Lab Results  Component Value Date   GLUCAP 308 (H) 02/16/2023   HGBA1C 9.6 (A) 02/16/2023    Review of Glycemic Control  Latest Reference Range & Units 02/18/23 04:04  Glucose 70 - 99 mg/dL 161 (H)   Diabetes history: DM 2 Outpatient Diabetes medications: Lantus 43 units qhs, Humalog 11 units tid, metformin 1000 mg bid Current orders for Inpatient glycemic control:  None A1c 10.2% on 4/22, 9.6% on 7/3  Recently seen Andres Richardson 4/22 to get Dexcom G7, other visits on 3/19  Glucose 295 on presentation  Inpatient Diabetes Program Recommendations:    -  Semglee 20 units -  Novolog 0-9 units tid + hs  Per PCP note pt is establishing care with endocrine with Andres Richardson in August 12. PCP also working on getting CGM for pt.  Spoke with pt regarding A1c level of 9.6% and glucose control at home. Pt has an Endocrinology appointment coming up soon to establish care, He follows up with his PCP every 3-4 weeks normally to monitor his health conditions. Pt next appt is July 31 st. Pt reports his A1c coming down nicely, he has not seen his A1c lower than a 10 in a long time. Pt PCP is working on getting the Dexcom G7 CGM 3 month supply for monitoring at home. Discussed glucose and A1c goals. Encouraged follow up.  Thanks,  Christena Deem RN, MSN, BC-ADM Inpatient Diabetes Coordinator Team Pager [phone] (8a-5p)

## 2023-02-18 NOTE — ED Provider Notes (Signed)
This discussed care with the cardiologist who will consult  IM will be admitting   Andres Hong, MD 02/18/23 [phone]

## 2023-02-18 NOTE — ED Provider Notes (Signed)
[employer] EMERGENCY DEPARTMENT AT [facility] Provider Note   CSN: [id number] Arrival date & time: 02/18/23  0348     History  Chief Complaint  Patient presents with   Shortness of Breath   Chest Pain    Andres Richardson is a 57 y.o. male.  HPI     This is a 57 year old male with a history of hypertension, peripheral arterial disease, tobacco abuse, diabetes, coronary artery disease who presents with chest pain and shortness of breath.  Patient reports that this woke him up from his sleep.  It was pressure-like.  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  He took a nitroglycerin at home with some relief.  Currently he is pain-free.  Patient not had a recent fever.  He does endorse a cough.  Already at catheterization in 2022 with multiple areas of stenosis.  No interventions.  Echocardiogram and 2023 with normal wall motion and normal EF.  Home Medications Prior to Admission medications   Medication Sig Start Date End Date Taking? Authorizing Provider  acetaminophen (TYLENOL) 500 MG tablet Take 1,000-1,500 mg by mouth 2 (two) times daily as needed for moderate pain.   Yes [provider]  aspirin EC 81 MG tablet Take 1 tablet (81 mg total) by mouth daily. Swallow whole. 02/16/23  Yes [name], [name], DO  atorvastatin (LIPITOR) 40 MG tablet Take 1 tablet (40 mg total) by mouth daily. 09/21/22  Yes Adron Bene, MD  calcium carbonate (TUMS - DOSED IN MG ELEMENTAL CALCIUM) 500 MG chewable tablet Chew 1 tablet by mouth at bedtime.   Yes [provider]  DULoxetine (CYMBALTA) 60 MG capsule Take 1 capsule (60 mg total) by mouth daily. 02/16/23 02/16/24 Yes [name], [name], DO  furosemide (LASIX) 40 MG tablet Take 1 tablet (40 mg total) by mouth daily. 09/21/22  Yes Adron Bene, MD  gabapentin (NEURONTIN) 100 MG capsule Take 1 capsule (100 mg total) by mouth 3 (three) times daily. 12/06/22 02/18/24 Yes Adron Bene, MD  gentamicin ointment (GARAMYCIN) 0.1 % Apply 1  Application topically daily. Patient taking differently: Apply 1 Application topically every other day. Apply to foot 01/18/23  Yes McDonald, Rachelle Hora, DPM  Homeopathic Products (LEG CRAMPS) TABS Take 3 tablets by mouth daily as needed (leg cramps).   Yes [provider]  insulin glargine (LANTUS) 100 UNIT/ML Solostar Pen Inject 43 Units into the skin at bedtime. 02/17/23  Yes [name], [name], DO  insulin lispro (HUMALOG KWIKPEN) 100 UNIT/ML KwikPen Inject 11 Units into the skin 3 (three) times daily. 02/17/23 03/08/25 Yes [name], [name], DO  Iron, Ferrous Sulfate, 325 (65 Fe) MG TABS Take 1 tablet by mouth daily. 06/02/21  Yes [provider]  isosorbide mononitrate (IMDUR) 30 MG 24 hr tablet Take 3 tablets (90 mg total) by mouth daily. 07/15/22  Yes Parke Poisson, MD  losartan (COZAAR) 25 MG tablet Take 0.5 tablets (12.5 mg total) by mouth daily. 01/20/23  Yes [name], [name], DO  metFORMIN (GLUCOPHAGE) 1000 MG tablet Take 1 tablet (1,000 mg total) by mouth 2 (two) times daily with a meal. 09/21/22 03/20/23 Yes Adron Bene, MD  metoprolol succinate (TOPROL-XL) 25 MG 24 hr tablet Take 1 tablet (25 mg total) by mouth daily. 09/21/22  Yes Adron Bene, MD  montelukast (SINGULAIR) 10 MG tablet Take 1 tablet (10 mg total) by mouth daily. 09/21/22 02/18/24 Yes Adron Bene, MD  Multiple Vitamin (MULTIVITAMIN WITH MINERALS) TABS tablet Take 1 tablet by mouth daily. 03/10/21  Yes  Almon Hercules, MD  nitroGLYCERIN (NITROSTAT) 0.4 MG SL tablet Place 1 tablet (0.4 mg total) under the tongue every 5 (five) minutes as needed for chest pain. 02/17/23  Yes [name], [name], DO  pantoprazole (PROTONIX) 40 MG tablet Take 1 tablet (40 mg total) by mouth daily. 09/21/22 09/16/23 Yes Adron Bene, MD  sertraline (ZOLOFT) 25 MG tablet Take 1 tablet (25 mg total) by mouth daily. 09/21/22 09/21/23 Yes Adron Bene, MD  Blood Glucose Monitoring Suppl DEVI May substitute to any manufacturer covered by patient's  insurance. 02/16/23   [name], Florentina Addison, DO  Continuous Glucose Sensor (DEXCOM G7 SENSOR) MISC USE AS DIRECTED ONCE A WEEK 01/12/23   Quincy Simmonds, MD  Glucose Blood (BLOOD GLUCOSE TEST STRIPS) STRP 1 each by In Vitro route in the morning, at noon, and at bedtime. May substitute to any manufacturer covered by patient's insurance. 02/16/23   [name], [name], DO  Insulin Pen Needle (PEN NEEDLES) 32G X 4 MM MISC 1 Units by Does not apply route 4 (four) times daily. 07/29/22   Quincy Simmonds, MD  Lancet Device MISC 1 each by Does not apply route in the morning, at noon, and at bedtime. May substitute to any manufacturer covered by patient's insurance. 02/16/23   [name], [name], DO  Lancets Misc. MISC 1 each by Does not apply route in the morning, at noon, and at bedtime. May substitute to any manufacturer covered by patient's insurance. 02/16/23 03/18/23  [name], [name], DO      Allergies    Adhesive [tape]    Review of Systems   Review of Systems  Constitutional:  Negative for fever.  Respiratory:  Positive for cough and shortness of breath.   Cardiovascular:  Positive for chest pain. Negative for leg swelling.  All other systems reviewed and are negative.   Physical Exam Updated Vital Signs BP (!) 182/81   Pulse 98   Temp 98.2 F (36.8 C)   Resp (!) 21   Ht 1.803 m (5\' 11" )   Wt 98 kg   SpO2 98%   BMI 30.13 kg/m  Physical Exam Vitals and nursing note reviewed.  Constitutional:      Appearance: He is well-developed.     Comments: Chronically ill-appearing, no acute distress  HENT:     Head: Normocephalic and atraumatic.  Eyes:     Pupils: Pupils are equal, round, and reactive to light.  Cardiovascular:     Rate and Rhythm: Regular rhythm. Tachycardia present.     Heart sounds: Normal heart sounds.  Pulmonary:     Effort: Pulmonary effort is normal. No respiratory distress.     Breath sounds: Normal breath sounds. No wheezing.  Abdominal:     General: Bowel sounds are normal.      Palpations: Abdomen is soft.     Tenderness: There is no abdominal tenderness. There is no rebound.     Comments: Abdominal scarring noted, colostomy left mid abdomen with brown stool  Musculoskeletal:     Cervical back: Neck supple.     Right lower leg: Edema present.     Left lower leg: Edema present.  Lymphadenopathy:     Cervical: No cervical adenopathy.  Skin:    General: Skin is warm and dry.  Neurological:     Mental Status: He is alert and oriented to person, place, and time.  Psychiatric:        Mood and Affect: Mood normal.     ED Results / Procedures / Treatments  Labs (all labs ordered are listed, but only abnormal results are displayed) Labs Reviewed  CBC WITH DIFFERENTIAL/PLATELET - Abnormal; Notable for the following components:      Result Value   Hemoglobin 12.9 (*)    All other components within normal limits  BASIC METABOLIC PANEL - Abnormal; Notable for the following components:   Glucose, Bld 295 (*)    BUN 31 (*)    Creatinine, Ser 1.66 (*)    Calcium 8.5 (*)    GFR, Estimated 48 (*)    All other components within normal limits  BRAIN NATRIURETIC PEPTIDE - Abnormal; Notable for the following components:   B Natriuretic Peptide 134.8 (*)    All other components within normal limits  D-DIMER, QUANTITATIVE - Abnormal; Notable for the following components:   D-Dimer, Quant 0.76 (*)    All other components within normal limits  TROPONIN I (HIGH SENSITIVITY) - Abnormal; Notable for the following components:   Troponin I (High Sensitivity) 20 (*)    All other components within normal limits  TROPONIN I (HIGH SENSITIVITY)    EKG None  Radiology DG Chest Portable 1 View  Result Date: 02/18/2023 CLINICAL DATA:  Chest pain, shortness of breath. EXAM: PORTABLE CHEST 1 VIEW COMPARISON:  10/01/2022. FINDINGS: The heart is mildly enlarged and mediastinal contours are stable. The pulmonary vasculature is distended. Lung volumes are low with patchy airspace  disease at the lung bases. No effusion or pneumothorax. No acute osseous abnormality. IMPRESSION: 1. Cardiomegaly with pulmonary vascular congestion. 2. Patchy airspace disease at the lung bases, possible edema or infiltrate. Electronically Signed   By: Thornell Sartorius M.D.   On: 02/18/2023 04:28    Procedures Procedures    Medications Ordered in ED Medications  furosemide (LASIX) injection 40 mg (has no administration in time range)  nitroGLYCERIN (NITROGLYN) 2 % ointment 1 inch (1 inch Topical Given 02/18/23 0440)    ED Course/ Medical Decision Making/ A&P                             Medical Decision Making Amount and/or Complexity of Data Reviewed Labs: ordered. Radiology: ordered.  Risk Prescription drug management.   This patient presents to the ED for concern of chest pain, this involves an extensive number of treatment options, and is a complaint that carries with it a high risk of complications and morbidity.  I considered the following differential and admission for this acute, potentially life threatening condition.  The differential diagnosis includes ACS, PE, pneumothorax, pneumonia, CHF  MDM:    This is a 57 year old male who presents with chest pain and shortness of breath.  Initially on supplemental oxygen.  This was weaned off.  He is a smoker.  No wheezing on exam.  Slight lower extremity swelling but not overtly volume overloaded.  EKG shows no evidence of acute arrhythmia or ischemia.  Troponin is 20.  This is significantly reduced from prior.  Given his initial tachycardia, D-dimer was sent and is positive.  Will obtain a CTA.  BNP is also slightly elevated and chest x-ray is suggestive of cardiomegaly with some vascular congestion.  Could have an element of CHF.  Creatinine is slightly elevated.  Patient was given 40 mg of IV Lasix.  CT PE study pending.  Repeat troponin pending.  Patient is not requiring supplemental oxygen.  Depending on the trends of these, he may be  able to be discharged with close cardiology follow-up  as he will need recheck of his creatinine.  (Labs, imaging, consults)  Labs: I Ordered, and personally interpreted labs.  The pertinent results include: CBC, BMP, BNP, troponin, D-dimer  Imaging Studies ordered: I ordered imaging studies including chest x-ray, CTA I independently visualized and interpreted imaging. I agree with the radiologist interpretation  Additional history obtained from chart review.  External records from outside source obtained and reviewed including prior evaluations  Cardiac Monitoring: The patient was maintained on a cardiac monitor.  If on the cardiac monitor, I personally viewed and interpreted the cardiac monitored which showed an underlying rhythm of: Sinus rhythm  Reevaluation: After the interventions noted above, I reevaluated the patient and found that they have :improved  Social Determinants of Health:  lives independently  Disposition: Pending  Co morbidities that complicate the patient evaluation  Past Medical History:  Diagnosis Date   CAD (coronary artery disease) 2022     Prox RCA lesion is 100% stenosed.   Prox Cx to Mid Cx lesion is 60% stenosed.   LPAV lesion is 60% stenosed.   Ost LAD to Prox LAD lesion is 40% stenosed.   Diabetes mellitus without complication (HCC)    Type !! 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 Diabetic neuropathy.   Fournier gangrene 2022   Grade I diastolic dysfunction 12/02/2021   History of creation of ostomy (HCC)    Hyperlipidemia    Mixed   Hypertension    Essential   Obesity    Peripheral arterial disease (HCC)    occluded left common iliac artery   Proteinuria    Tobacco use disorder      Medicines Meds ordered this encounter  Medications   nitroGLYCERIN (NITROGLYN) 2 % ointment 1 inch   furosemide (LASIX) injection 40 mg    I have reviewed the patients home medicines and have made adjustments as needed  Problem List / ED  Course: Problem List Items Addressed This Visit   None               Final Clinical Impression(s) / ED Diagnoses Final diagnoses:  None    Rx / DC Orders ED Discharge Orders     None         Shon Baton, MD 02/18/23 [id number]

## 2023-02-18 NOTE — Progress Notes (Signed)
Lower extremity venous bilateral study completed.   Please see CV Proc for preliminary results.   Divinity Kyler, RDMS, RVT  

## 2023-02-18 NOTE — H&P (Addendum)
Date: 02/18/2023               Patient Name:  Andres Richardson MRN: [id number]  DOB: Jul 10, 1966 Age / Sex: 57 y.o., male   PCP: Rudene Christians, DO         Medical Service: Internal Medicine Teaching Service         Attending Physician: Dr. Inez Catalina, MD    First Contact: Dr. Hessie Diener Pager: [phone]  Second Contact: Dr. Lily Kocher Pager: [phone]       After Hours (After 5p/  First Contact Pager: [phone]  weekends / holidays): Second Contact Pager: [phone]   Chief Complaint: Chest Pain  History of Present Illness:  Patient is a 57 year old male with PMHx of HFpEF, HTN, DMII, HLD c/b CAD, and PAD who presented to the ED via EMS for chest heaviness that started around 2 am. He states he woke up due to coughing. He had chest heaviness at this time and felt like elephant sitting on chest. He reports his chest pain is more of a heaviness and is localized to chest. He states with time pt had worsening difficulty with breathing. The chest pain resolved after oxygen administration. He does report throwing up once while waiting for the EMT. He states he felt normal on the prior day and slept with his breathing at baseline. He uses a wedge and one pillow to sleep and no recent change in that. He denies any sick contacts. He does not have any fevers or chills. Similar episodes before about 4-5 times and has been told he had a heart attack. He had a cath in 05/2021 that showed total occlusion of RCA, and <70 % occlusions in multiple other coronary arteries but no stents were placed.  Pt's Cardiologist, Dr. Knox Royalty and saw her last 3 months ago. Sees every 6 months. PCP is [facility] and last seen 2 days ago.   Review of Systems negative unless stated in the HPI.  In the ED, IV lasix 40 mg once. NTG given.   Past Medical History: HTN HLD c/b CAD and PAD DMII 2 Abdominal Hernias HFpEF Hx of gastric ulcers S/p colostomy after bacterial infection Tobacco Use Disorder  Meds:  Current Outpatient  Medications  Medication Instructions   acetaminophen (TYLENOL) 1,000-1,500 mg, Oral, 2 times daily PRN   aspirin EC 81 mg, Oral, Daily, Swallow whole.   atorvastatin (LIPITOR) 40 mg, Oral, Daily   Blood Glucose Monitoring Suppl DEVI May substitute to any manufacturer covered by patient's insurance.   calcium carbonate (TUMS - DOSED IN MG ELEMENTAL CALCIUM) 500 MG chewable tablet 1 tablet, Oral, Daily at bedtime   Continuous Glucose Sensor (DEXCOM G7 SENSOR) MISC USE AS DIRECTED ONCE A WEEK   DULoxetine (CYMBALTA) 60 mg, Oral, Daily   furosemide (LASIX) 40 mg, Oral, Daily   gabapentin (NEURONTIN) 100 mg, Oral, 3 times daily   gentamicin ointment (GARAMYCIN) 0.1 % 1 Application, Topical, Daily   Glucose Blood (BLOOD GLUCOSE TEST STRIPS) STRP 1 each, In Vitro, 3 times daily, May substitute to any manufacturer covered by [employer].   Homeopathic Products (LEG CRAMPS) TABS 3 tablets, Oral, Daily PRN   insulin glargine (LANTUS) 43 Units, Subcutaneous, Daily at bedtime   insulin lispro (HUMALOG KWIKPEN) 11 Units, Subcutaneous, 3 times daily   Iron, Ferrous Sulfate, 325 (65 Fe) MG TABS 1 tablet, Oral, Daily   isosorbide mononitrate (IMDUR) 90 mg, Oral, Daily   Lancet Device MISC 1 each, Does not  apply, 3 times daily, May substitute to any manufacturer covered by patient's insurance.   Lancets Misc. MISC 1 each, Does not apply, 3 times daily, May substitute to any manufacturer covered by patient's insurance.   losartan (COZAAR) 12.5 mg, Oral, Daily   metFORMIN (GLUCOPHAGE) 1,000 mg, Oral, 2 times daily with meals   metoprolol succinate (TOPROL-XL) 25 mg, Oral, Daily   montelukast (SINGULAIR) 10 mg, Oral, Daily   Multiple Vitamin (MULTIVITAMIN WITH MINERALS) TABS tablet 1 tablet, Oral, Daily   nitroGLYCERIN (NITROSTAT) 0.4 mg, Sublingual, Every 5 min PRN   pantoprazole (PROTONIX) 40 mg, Oral, Daily   Pen Needles 1 Units, Does not apply, 4 times daily   sertraline (ZOLOFT) 25 mg, Oral,  Daily   Doesn't take lipitor as it bothers legs.  Losartan higher doses causes leg jitters  Allergies: Allergies as of 02/18/2023 - Review Complete 02/18/2023  Allergen Reaction Noted   Adhesive [tape] Rash 03/30/2017    Past Surgical History: Past Surgical History:  Procedure Laterality Date   BIOPSY  12/04/2021   Procedure: BIOPSY;  Surgeon: Kerin Salen, MD;  Location: [facility];  Service: Gastroenterology;;   ESOPHAGOGASTRODUODENOSCOPY (EGD) WITH PROPOFOL N/A 12/04/2021   Procedure: ESOPHAGOGASTRODUODENOSCOPY (EGD) WITH PROPOFOL;  Surgeon: Kerin Salen, MD;  Location: [facility];  Service: Gastroenterology;  Laterality: N/A;   INCISION AND DRAINAGE ABSCESS N/A 02/24/2021   Procedure: INCISION AND DRAINAGE PERINEUM;  Surgeon: Sheliah Hatch, De Blanch, MD;  Location: [facility];  Service: General;  Laterality: N/A;   INCISION AND DRAINAGE ABSCESS N/A 02/25/2021   Procedure: REPEAT WASHOUT OF PERINEUM;  Surgeon: Sheliah Hatch De Blanch, MD;  Location: [facility];  Service: General;  Laterality: N/A;   LAPAROSCOPIC DIVERTED COLOSTOMY N/A 02/25/2021   Procedure: LAPAROSCOPIC DIVERTING SIGMOID COLOSTOMY;  Surgeon: [name], MD;  Location: [facility];  Service: General;  Laterality: N/A;   LEFT HEART CATH AND CORONARY ANGIOGRAPHY N/A 05/18/2021   Procedure: LEFT HEART CATH AND CORONARY ANGIOGRAPHY;  Surgeon: Runell Gess, MD;  Location: [facility];  Service: Cardiovascular;  Laterality: N/A;    Family History:  Family History  Problem Relation Age of Onset   CAD Mother    Colon cancer Father   CAD/heart attack father  Social History:  Lives with brother. Disabled. Can do all ADLs and iADLs. Smokes 1/4 ppd for 25 years with up to 1-2 ppd. 2 years since having more than 1 pack. Alcohol on special occasions. No illicit drug use.   Physical Exam: Blood pressure 121/88, pulse 93, temperature 98.2 F (36.8 C), temperature source Oral, resp. rate 17, height 5\' 11"  (1.803 m),  weight 98 kg, SpO2 100 %. General: NAD, laying in bed HENT: NCAT Lungs: bibasilar rales Cardiovascular: NSR, good radial pulses, nonpalpable pedal pulses but present with doppler, No JVD Abdomen: No TTP, normal bowel sounds, colostomy present with stool output MSK: no asymmetry Skin: ulcer noted on right plantar foot, no drainage, look clean (see media tab) Neuro: alert and oriented x4 Psych: normal mood and normal affect  Diagnostics:     Latest Ref Rng & Units 02/18/2023   11:38 AM 02/18/2023    4:04 AM 04/15/2022    8:39 PM  CBC  WBC 4.0 - 10.5 K/uL 7.2  8.2  6.9   Hemoglobin 13.0 - 17.0 g/dL 16.1  09.6  04.5   Hematocrit 39.0 - 52.0 % 34.9  40.6  41.9   Platelets 150 - 400 K/uL 209  221  215  Latest Ref Rng & Units 02/18/2023    4:04 AM 01/19/2023    4:32 PM 10/19/2022    4:17 PM  CMP  Glucose 70 - 99 mg/dL 161  096  045   BUN 6 - 20 mg/dL 31  21  18    Creatinine 0.61 - 1.24 mg/dL 4.09  8.11  9.14   Sodium 135 - 145 mmol/L 140  139  145   Potassium 3.5 - 5.1 mmol/L 4.3  4.4  4.5   Chloride 98 - 111 mmol/L 105  105  105   CO2 22 - 32 mmol/L 22  21  20    Calcium 8.9 - 10.3 mg/dL 8.5  8.3  8.1   Total Protein 6.0 - 8.5 g/dL  6.1  6.0   Total Bilirubin 0.0 - 1.2 mg/dL  <7.8    Alkaline Phos 44 - 121 IU/L  94    AST 0 - 40 IU/L  9    ALT 0 - 44 IU/L  8      Trop 20-->53-->pending  D-Dimer elevated, CTA without PE as below  BNP: Elevated 134.8  CT Angio Chest PE W and/or Wo Contrast  Result Date: 02/18/2023 CLINICAL DATA:  Short of breath and chest pain. EXAM: CT ANGIOGRAPHY CHEST WITH CONTRAST TECHNIQUE IMPRESSION: 1. No evidence of a pulmonary embolism. 2. Lung findings with interstitial thickening and patchy ground-glass opacities. Suspect pulmonary edema. Infection should be considered in the proper clinical setting. 3. Three-vessel coronary artery calcifications. Aortic Atherosclerosis (ICD10-I70.0) and Emphysema (ICD10-J43.9). Electronically Signed   By: Amie Portland M.D.   On: 02/18/2023 10:19   DG Chest Portable 1 View  Result Date: 02/18/2023 CLINICAL DATA:  Chest pain, shortness of breath. EXAM: PORTABLE CHEST 1 VIEW COMPARISON:  10/01/2022.  IMPRESSION: 1. Cardiomegaly with pulmonary vascular congestion. 2. Patchy airspace disease at the lung bases, possible edema or infiltrate. Electronically Signed   By: Thornell Sartorius M.D.   On: 02/18/2023 04:28       EKG: personally reviewed my interpretation is sinus rhythm with notable ST depression in multiple leads.   CXR: personally reviewed my interpretation is vascular congestion R>L.  Assessment & Plan by Problem:  Present on Admission:  Flash pulmonary edema (HCC)   Flash Pulmonary Edema vs ACS HTN Pt with sudden onset shortness of breath and coughing that awoke him from sleep. His physical exam, chest xray and initial blood pressure at EMT arrival elevated at 217/110. Pt bp improved significantly with one dose of lasix to 122/88. Pt was on 3L upon my evaluation but weaned to 1 L and still satting 100. He may be successfully weaned off oxygen later today. Will continue to trend troponin to ensure that are not rising. Pt's home BP regimen is losartan 12.5 mg every day, imdur 90 mg every day. Pt does not check BP at home although he has a monitor. BP is improving with diuresis and we can restart his home regimen slowly and if remains elevated then we can increase it based on echo results. Cardiology following; appreciate their assistance. Given the hx of CAD, pt was offered catheterization but refused invasive intervention and wants to pursue medical management.   AKI Pt Cr at 1.6 from baseline of 1.1. Suspect this is secondary to pt's acute hypertension. Will continue to trend Cr and work up further with urine studies and renal ultrasound if needed. Plan to repeat BMP prior to next lasix dose to see if it is stable to improving.  Right Lower extremity swelling Noted on physical exam. Doppler of  bilateral lower extremities ordered.   DMII A1c 7.3. Pt on insulin 43 units glargine, 11 units Aspart TID, Metformin 1000 mg BID. Pt did have CGM but has been out for the last 3 days. Will continue insulin at half dose and rSSI and hold metformin. Can titrate up as needed.   HLD/CAD/PAD On aspirin, and lipitor 40 mg every day. He reports not taking the lipitor but refill hx shows consistent refills. When asked the indication for lipitor, he states it is for his cholesterol. We can change him to a Crestor here to see how he tolerates it.   Tobacco Use Disorder Chronic smoker. Smokes 25 pack years. Has cut down to 4-5 cigarettes per day and smokes more after meals. Declined patch or gum. Appears to be more habitual at this point.   DVT prophx: Lovenox Diet: HH/Carb Bowel: PRN Code: Full   Prior to Admission Living Arrangement: Home Anticipated Discharge Location: Home Barriers to Discharge: Medical Workup  Dispo: Admit patient to Inpatient with expected length of stay greater than 2 midnights.  Gwenevere Abbot, MD [name]. [facility] Internal Medicine Residency, PGY-3 Pager: [phone]
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
## 2023-02-19 ENCOUNTER — Inpatient Hospital Stay (HOSPITAL_COMMUNITY): Payer: Medicare Other

## 2023-02-19 DIAGNOSIS — E119 Type 2 diabetes mellitus without complications: Secondary | ICD-10-CM

## 2023-02-19 DIAGNOSIS — I5032 Chronic diastolic (congestive) heart failure: Secondary | ICD-10-CM

## 2023-02-19 DIAGNOSIS — F17210 Nicotine dependence, cigarettes, uncomplicated: Secondary | ICD-10-CM | POA: Diagnosis not present

## 2023-02-19 DIAGNOSIS — J810 Acute pulmonary edema: Secondary | ICD-10-CM | POA: Diagnosis not present

## 2023-02-19 DIAGNOSIS — I2510 Atherosclerotic heart disease of native coronary artery without angina pectoris: Secondary | ICD-10-CM | POA: Diagnosis not present

## 2023-02-19 DIAGNOSIS — I5031 Acute diastolic (congestive) heart failure: Secondary | ICD-10-CM | POA: Diagnosis not present

## 2023-02-19 LAB — ECHOCARDIOGRAM COMPLETE
AR max vel: 2.58 cm2
AV Area VTI: 2.49 cm2
AV Area mean vel: 2.42 cm2
AV Mean grad: 5 mmHg
AV Peak grad: 9 mmHg
AV Vena cont: 0.6 cm
Ao pk vel: 1.5 m/s
Area-P 1/2: 3.17 cm2
Height: 71 in
P 1/2 time: 623 msec
S' Lateral: 4.4 cm
Weight: 3358.05 oz

## 2023-02-19 LAB — BASIC METABOLIC PANEL
Anion gap: 6 (ref 5–15)
BUN: 29 mg/dL — ABNORMAL HIGH (ref 6–20)
CO2: 25 mmol/L (ref 22–32)
Calcium: 8 mg/dL — ABNORMAL LOW (ref 8.9–10.3)
Chloride: 106 mmol/L (ref 98–111)
Creatinine, Ser: 1.47 mg/dL — ABNORMAL HIGH (ref 0.61–1.24)
GFR, Estimated: 55 mL/min — ABNORMAL LOW (ref >60)
Glucose, Bld: 81 mg/dL (ref 70–99)
Potassium: 3.6 mmol/L (ref 3.5–5.1)
Sodium: 137 mmol/L (ref 135–145)

## 2023-02-19 LAB — GLUCOSE, CAPILLARY
Glucose-Capillary: 130 mg/dL — ABNORMAL HIGH (ref 70–99)
Glucose-Capillary: 184 mg/dL — ABNORMAL HIGH (ref 70–99)
Glucose-Capillary: 206 mg/dL — ABNORMAL HIGH (ref 70–99)
Glucose-Capillary: 218 mg/dL — ABNORMAL HIGH (ref 70–99)

## 2023-02-19 LAB — SEDIMENTATION RATE: Sed Rate: 72 mm/hr — ABNORMAL HIGH (ref 0–16)

## 2023-02-19 LAB — CBC
HCT: 37.3 % — ABNORMAL LOW (ref 39.0–52.0)
Hemoglobin: 12.4 g/dL — ABNORMAL LOW (ref 13.0–17.0)
MCH: 28.6 pg (ref 26.0–34.0)
MCHC: 33.2 g/dL (ref 30.0–36.0)
MCV: 85.9 fL (ref 80.0–100.0)
Platelets: 247 10*3/uL (ref 150–400)
RBC: 4.34 MIL/uL (ref 4.22–5.81)
RDW: 14.2 % (ref 11.5–15.5)
WBC: 10 10*3/uL (ref 4.0–10.5)
nRBC: 0 % (ref 0.0–0.2)

## 2023-02-19 LAB — C-REACTIVE PROTEIN: CRP: 1.3 mg/dL — ABNORMAL HIGH (ref <1.0)

## 2023-02-19 MED ORDER — ISOSORBIDE MONONITRATE ER 30 MG PO TB24
30.0000 mg | ORAL_TABLET | Freq: Every day | ORAL | Status: DC
  Administered 2023-02-19 – 2023-02-20 (×2): 30 mg via ORAL
  Filled 2023-02-19 (×2): qty 1

## 2023-02-19 MED ORDER — METOPROLOL SUCCINATE ER 25 MG PO TB24
25.0000 mg | ORAL_TABLET | Freq: Every day | ORAL | Status: DC
  Administered 2023-02-19 – 2023-02-20 (×2): 25 mg via ORAL
  Filled 2023-02-19 (×2): qty 1

## 2023-02-19 MED ORDER — FUROSEMIDE 10 MG/ML IJ SOLN
40.0000 mg | Freq: Once | INTRAMUSCULAR | Status: AC
  Administered 2023-02-19: 40 mg via INTRAVENOUS
  Filled 2023-02-19: qty 4

## 2023-02-19 MED ORDER — FUROSEMIDE 40 MG PO TABS: 40.0000 mg | ORAL_TABLET | Freq: Once | ORAL | Status: DC

## 2023-02-19 MED ORDER — ASPIRIN 81 MG PO TBEC
81.0000 mg | DELAYED_RELEASE_TABLET | Freq: Every day | ORAL | Status: DC
  Administered 2023-02-19 – 2023-02-20 (×2): 81 mg via ORAL
  Filled 2023-02-19 (×2): qty 1

## 2023-02-19 MED ORDER — POTASSIUM CHLORIDE CRYS ER 20 MEQ PO TBCR
40.0000 meq | EXTENDED_RELEASE_TABLET | Freq: Once | ORAL | Status: DC
  Filled 2023-02-19: qty 2

## 2023-02-19 NOTE — Progress Notes (Signed)
Notified Dr. Daiva Eves that patient refuses Potassium despite education. No further orders received.

## 2023-02-19 NOTE — Progress Notes (Signed)
Progress Note  Patient Name: Andres Richardson Date of Encounter: 02/19/2023  Primary Cardiologist: Parke Poisson, MD  Interval Summary   Feels generally better today.  No cough or shortness of breath at rest, no chest tightness.  Vital Signs    Vitals:   02/19/23 0406 02/19/23 0427 02/19/23 0611 02/19/23 0740  BP: 126/67  112/63 108/68  Pulse: 96 (!) 102    Resp: 18   18  Temp: 98.5 F (36.9 C)   98.9 F (37.2 C)  TempSrc: Oral   Oral  SpO2: 99% 100%    Weight:  95.2 kg    Height:        Intake/Output Summary (Last 24 hours) at 02/19/2023 0903 Last data filed at 02/19/2023 0834 Gross per 24 hour  Intake 871 ml  Output 1950 ml  Net -1079 ml   Filed Weights   02/18/23 0403 02/18/23 1227 02/19/23 0427  Weight: 98 kg 96.1 kg 95.2 kg    Physical Exam   GEN: No acute distress.   Neck: No JVD. Cardiac: RRR, no murmur, rub, or gallop.  Respiratory: Nonlabored.  Few rhonchi, no wheezing. GI: Soft, nontender, bowel sounds present. MS: Chronic appearing lower extremity edema. Neuro:  Nonfocal. Psych: Alert and oriented x 3. Normal affect.  ECG/Telemetry    An ECG dated 02/18/2023 was personally reviewed today and demonstrated:  Sinus rhythm with inferolateral ST segment depression.  Labs    Chemistry Recent Labs  Lab 02/18/23 0404 02/18/23 1429 02/19/23 0041  NA 140 137 137  K 4.3 3.8 3.6  CL 105 105 106  CO2 22 23 25   GLUCOSE 295* 289* 81  BUN 31* 27* 29*  CREATININE 1.66* 1.57* 1.47*  CALCIUM 8.5* 7.9* 8.0*  GFRNONAA 48* 51* 55*  ANIONGAP 13 9 6     Hematology Recent Labs  Lab 02/18/23 0404 02/18/23 1138 02/19/23 0041  WBC 8.2 7.2 10.0  RBC 4.56 4.06* 4.34  HGB 12.9* 11.4* 12.4*  HCT 40.6 34.9* 37.3*  MCV 89.0 86.0 85.9  MCH 28.3 28.1 28.6  MCHC 31.8 32.7 33.2  RDW 14.5 14.4 14.2  PLT 221 209 247   Cardiac Enzymes Recent Labs  Lab 02/18/23 0404 02/18/23 0614 02/18/23 1432 02/18/23 1559  TROPONINIHS 20* 53* 45* 42*   Lipid Panel      Component Value Date/Time   CHOL 136 02/16/2023 1526   TRIG 132 02/16/2023 1526   HDL 34 (L) 02/16/2023 1526   CHOLHDL 4.0 02/16/2023 1526   CHOLHDL 4.0 05/13/2021 0407   VLDL 39 05/13/2021 0407   LDLCALC 78 02/16/2023 1526   LABVLDL 24 02/16/2023 1526    Cardiac Studies   Echocardiogram pending.  Assessment & Plan   1.  Chest pain and shortness of breath following recent onset of cough and some degree of orthopnea.  Chest CT suggests component of pulmonary edema.  No clear evidence of ACS with minor, flat elevation high-sensitivity troponin I.  BNP 135.  Echocardiogram pending for reassessment of LVEF (60 to 65% as of September 2023).  No evidence of pulmonary embolus or lower extremity DVT.  CRP and the ESR elevated, not necessarily pleuritic type chest discomfort and no rub on examination.  2.  CAD with known CTO of the RCA documented in October 2022 and managed medically.  3.  Acute renal insufficiency, creatinine down to 1.47 despite concurrent diuresis.  4.  PAD.  Chart reviewed.  Follow-up echocardiogram which is pending.  Give additional dose of IV Lasix  today.  Resume Toprol-XL but continue to hold Cozaar for now.  Resume aspirin and low-dose Imdur for now.  Adjust therapy based on blood pressure trend.  For questions or updates, please contact  [facility] Please consult [email or web address] for contact info under   Signed, Nona Dell, MD  02/19/2023, 9:03 AM

## 2023-02-19 NOTE — Progress Notes (Signed)
Subjective:  Andres Richardson  is a 57 year old male  with PMHx of HFpEF, HTN, DMII, HLD c/b CAD, and PAD who presented to the ED via EMS for chest heaviness that started around 2 am. He states he woke up due to coughing and had chest heaviness at this time that felt like elephant sitting on chest. He also endorsed pleutic chest pain that improved with oxygen administration. A CXR demonstrated cardiomegaly with pulmonary vascular congestion and patchy airspace disease at the lung bases, possible edema or infiltrate. He is admitted to our service for further evaluation of pulmonary edema likely due to hypertension and an AKI.   Overnight events: None  Today, he reports feeling better.  His chest pain has improved along with his shortness of breath.  Patient does not currently have palpitations. He reported orthopnea overnight.  He does not have any complaints and would like to go home.  Objective:  Vital signs in last 24 hours: Vitals:   02/18/23 2041 02/19/23 0043 02/19/23 0406 02/19/23 0427  BP: (!) 133/58 126/61 126/67   Pulse: 77 87 96 (!) 102  Resp: 16 17 18    Temp: 98.4 F (36.9 C) 98.7 F (37.1 C) 98.5 F (36.9 C)   TempSrc: Oral Oral Oral   SpO2: 100% 94% 99% 100%  Weight:    95.2 kg  Height:       Physical exam: Non Ill-appearing, sitting in bed comfortably, not in acute distress Cardiac: Regular rate and rhythm, no murmurs auscultated, trace lower extremity edema Pulmonary: Crackles auscultated in the right lower lobe.  Not tachypneic, normal work of breathing observed Neuro: Moving all 4 extremities  Derm: Skin is warm and dry       Latest Ref Rng & Units 02/19/2023   12:41 AM 02/18/2023   11:38 AM 02/18/2023    4:04 AM  CBC  WBC 4.0 - 10.5 K/uL 10.0  7.2  8.2   Hemoglobin 13.0 - 17.0 g/dL 40.9  81.1  91.4   Hematocrit 39.0 - 52.0 % 37.3  34.9  40.6   Platelets 150 - 400 K/uL 247  209  221        Latest Ref Rng & Units 02/19/2023   12:41 AM 02/18/2023    2:29 PM  02/18/2023    4:04 AM  CMP  Glucose 70 - 99 mg/dL 81  782  956   BUN 6 - 20 mg/dL 29  27  31    Creatinine 0.61 - 1.24 mg/dL 2.13  0.86  5.78   Sodium 135 - 145 mmol/L 137  137  140   Potassium 3.5 - 5.1 mmol/L 3.6  3.8  4.3   Chloride 98 - 111 mmol/L 106  105  105   CO2 22 - 32 mmol/L 25  23  22    Calcium 8.9 - 10.3 mg/dL 8.0  7.9  8.5      Assessment/Plan:  Principal Problem:   Flash pulmonary edema (HCC)  #Flash Pulmonary Edema due to hypertension vs CHF -Presented to the emergency department with a blood pressure of 181/89. Chest xray revealed pulmonary congestion and cardiomegaly.  CT angiogram of chest did not show evidence of pulmonary emboli -Received 40 mg IV Lasix this morning  -Diuresed 1950 within the first 24 hours with 40mg  IV Lasix  -Will f/u with today's output   -Cr down trending, Bicarb is stable -Continue medical management with metoprolol succinate, IV Lasix, Imdur -Pt does not want further work up with  catheterization   #Chronic Diastolic Heart Failure Last prior echo has EF of 60-65%. Pulmonary edmea could be due to Acute systolic on chronic diastolic heart failure -Repeat echo, f/u on results -Strict Is and Os -Daily weights -Received 40 mg IV Lasix this morning -Cardiology consulted, follow-up on the recommendations  #CAD -Cardiology evaluated patient who does not wish to have a coronary cathettherization performed -Continue ASA, Crestor -F/u on echo results    #AKI Baseline Cr noted at 1.1 . AKI likely due to hypertension or decreased perfusion  -Creatinine peaked to 1.66, downtrended to 1.47 -Continue to diurese patient  -Continue blood pressure control   #Right lower extremity edema -Bilateral lower extremity venous Doppler showed no evidence of deep vein thrombosis in the lower extremity  #T2DM -Continue 21 units of glargine -Continue NovoLog sliding scale insulin      Faith Rogue, DO 02/19/2023, 5:38 AM Pager: [phone] After  5pm on weekdays and 1pm on weekends: On Call pager [phone]

## 2023-02-19 NOTE — Progress Notes (Signed)
*  PRELIMINARY RESULTS* Echocardiogram 2D Echocardiogram has been performed.  Laddie Aquas 02/19/2023, 5:31 PM

## 2023-02-19 NOTE — Plan of Care (Signed)
  Problem: Clinical Measurements: Goal: Will remain free from infection Outcome: Completed/Met Goal: Respiratory complications will improve Outcome: Completed/Met   Problem: Nutrition: Goal: Adequate nutrition will be maintained Outcome: Completed/Met   Problem: Coping: Goal: Level of anxiety will decrease Outcome: Completed/Met   Problem: Elimination: Goal: Will not experience complications related to bowel motility Outcome: Completed/Met Goal: Will not experience complications related to urinary retention Outcome: Completed/Met   Problem: Pain Managment: Goal: General experience of comfort will improve Outcome: Completed/Met   Problem: Nutritional: Goal: Maintenance of adequate nutrition will improve Outcome: Completed/Met

## 2023-02-19 NOTE — Hospital Course (Addendum)
#  Flash Pumlomary edema The patient presented to the emergency department after he work up with heaviness in his chest and pleuritic chest pain. His blood pressure on presentation was 181/89 and was on 3L O2. His blood pressure and chest pain and oxygen requirements have improved after receiving IV lasix. Cardiology evaluated the patient and did not believe his chest pain was caused from ACS due to the decreasing troponins and stable EKG without significant change. His echocardiogram on 7/6 reveal an LVEF of 50-55%.  Prior to discharge, he was asymptomatic and physical exam was reassuring for euvolemic status.  He will be sent home on home regimen of 40 mg oral Lasix.   #AKI vs Acute injury on chronic kidney disease  His serum creatinine was elevated on arrival at 1.66. After receiving IV Diuretics, his creatine has lowered to 1.47. His GFR has also improved after receiving the IV diuretics. Will require further work up to determine whether his AKI was in the setting of CKD or from HTN.  Previous urinalysis showed 4+ protein, patient reported having nephrology appointment in August. Losartan was held per cardiology, will recommend repeat BMP in 7 to 10 days.

## 2023-02-20 ENCOUNTER — Other Ambulatory Visit: Payer: Self-pay | Admitting: Internal Medicine

## 2023-02-20 DIAGNOSIS — E1165 Type 2 diabetes mellitus with hyperglycemia: Secondary | ICD-10-CM

## 2023-02-20 DIAGNOSIS — J810 Acute pulmonary edema: Secondary | ICD-10-CM | POA: Diagnosis not present

## 2023-02-20 DIAGNOSIS — I5032 Chronic diastolic (congestive) heart failure: Secondary | ICD-10-CM | POA: Diagnosis not present

## 2023-02-20 DIAGNOSIS — F17210 Nicotine dependence, cigarettes, uncomplicated: Secondary | ICD-10-CM | POA: Diagnosis not present

## 2023-02-20 DIAGNOSIS — I2510 Atherosclerotic heart disease of native coronary artery without angina pectoris: Secondary | ICD-10-CM | POA: Diagnosis not present

## 2023-02-20 LAB — RENAL FUNCTION PANEL
Albumin: 2.3 g/dL — ABNORMAL LOW (ref 3.5–5.0)
Anion gap: 8 (ref 5–15)
BUN: 39 mg/dL — ABNORMAL HIGH (ref 6–20)
CO2: 22 mmol/L (ref 22–32)
Calcium: 7.7 mg/dL — ABNORMAL LOW (ref 8.9–10.3)
Chloride: 104 mmol/L (ref 98–111)
Creatinine, Ser: 1.59 mg/dL — ABNORMAL HIGH (ref 0.61–1.24)
GFR, Estimated: 50 mL/min — ABNORMAL LOW (ref >60)
Glucose, Bld: 291 mg/dL — ABNORMAL HIGH (ref 70–99)
Phosphorus: 3.7 mg/dL (ref 2.5–4.6)
Potassium: 3.8 mmol/L (ref 3.5–5.1)
Sodium: 134 mmol/L — ABNORMAL LOW (ref 135–145)

## 2023-02-20 LAB — CBC
HCT: 31.7 % — ABNORMAL LOW (ref 39.0–52.0)
Hemoglobin: 10.5 g/dL — ABNORMAL LOW (ref 13.0–17.0)
MCH: 28.2 pg (ref 26.0–34.0)
MCHC: 33.1 g/dL (ref 30.0–36.0)
MCV: 85 fL (ref 80.0–100.0)
Platelets: 229 10*3/uL (ref 150–400)
RBC: 3.73 MIL/uL — ABNORMAL LOW (ref 4.22–5.81)
RDW: 14.5 % (ref 11.5–15.5)
WBC: 7.4 10*3/uL (ref 4.0–10.5)
nRBC: 0 % (ref 0.0–0.2)

## 2023-02-20 LAB — GLUCOSE, CAPILLARY
Glucose-Capillary: 309 mg/dL — ABNORMAL HIGH (ref 70–99)
Glucose-Capillary: 134 mg/dL — ABNORMAL HIGH (ref 70–99)

## 2023-02-20 MED ORDER — ISOSORBIDE MONONITRATE ER 30 MG PO TB24: 30.0000 mg | ORAL_TABLET | Freq: Every day | ORAL | 0 refills | Status: DC

## 2023-02-20 MED ORDER — ROSUVASTATIN CALCIUM 20 MG PO TABS: 20.0000 mg | ORAL_TABLET | Freq: Every day | ORAL | 0 refills | Status: DC

## 2023-02-20 MED ORDER — POTASSIUM CHLORIDE CRYS ER 10 MEQ PO TBCR: 10.0000 meq | EXTENDED_RELEASE_TABLET | Freq: Every day | ORAL | Status: DC

## 2023-02-20 NOTE — Progress Notes (Signed)
Progress Note  Patient Name: Andres Richardson Date of Encounter: 02/20/2023  Primary Cardiologist: Parke Poisson, MD  Interval Summary   No chest pain or shortness of breath.  Has ambulated with staff.  Feels ready to go home.  Vital Signs    Vitals:   02/19/23 2100 02/20/23 0017 02/20/23 0457 02/20/23 0715  BP:  109/62 126/72 113/72  Pulse:  84 81 79  Resp:  18 18 18   Temp: 97.8 F (36.6 C) 99.3 F (37.4 C) 99.2 F (37.3 C) 99 F (37.2 C)  TempSrc: Oral Oral Oral Oral  SpO2:  99% 98% 99%  Weight:   96.6 kg   Height:        Intake/Output Summary (Last 24 hours) at 02/20/2023 1034 Last data filed at 02/20/2023 0827 Gross per 24 hour  Intake 637 ml  Output 1776 ml  Net -1139 ml   Filed Weights   02/18/23 1227 02/19/23 0427 02/20/23 0457  Weight: 96.1 kg 95.2 kg 96.6 kg    Physical Exam   GEN: No acute distress.   Neck: No JVD. Cardiac: RRR, no murmur, rub, or gallop.  Respiratory: Nonlabored.  Few rhonchi, no wheezing. GI: Soft, nontender, bowel sounds present. MS: Chronic appearing lower extremity edema. Neuro:  Nonfocal. Psych: Alert and oriented x 3. Normal affect.  ECG/Telemetry    An ECG dated 02/18/2023 was personally reviewed today and demonstrated:  Sinus rhythm with inferolateral ST segment depression.  Telemetry reviewed showing sinus rhythm.  Labs    Chemistry Recent Labs  Lab 02/18/23 1429 02/19/23 0041 02/20/23 0058  NA 137 137 134*  K 3.8 3.6 3.8  CL 105 106 104  CO2 23 25 22   GLUCOSE 289* 81 291*  BUN 27* 29* 39*  CREATININE 1.57* 1.47* 1.59*  CALCIUM 7.9* 8.0* 7.7*  ALBUMIN  --   --  2.3*  GFRNONAA 51* 55* 50*  ANIONGAP 9 6 8     Hematology Recent Labs  Lab 02/18/23 1138 02/19/23 0041 02/20/23 0058  WBC 7.2 10.0 7.4  RBC 4.06* 4.34 3.73*  HGB 11.4* 12.4* 10.5*  HCT 34.9* 37.3* 31.7*  MCV 86.0 85.9 85.0  MCH 28.1 28.6 28.2  MCHC 32.7 33.2 33.1  RDW 14.4 14.2 14.5  PLT 209 247 229   Cardiac Enzymes Recent Labs   Lab 02/18/23 0404 02/18/23 0614 02/18/23 1432 02/18/23 1559  TROPONINIHS 20* 53* 45* 42*   Lipid Panel     Component Value Date/Time   CHOL 136 02/16/2023 1526   TRIG 132 02/16/2023 1526   HDL 34 (L) 02/16/2023 1526   CHOLHDL 4.0 02/16/2023 1526   CHOLHDL 4.0 05/13/2021 0407   VLDL 39 05/13/2021 0407   LDLCALC 78 02/16/2023 1526   LABVLDL 24 02/16/2023 1526    Cardiac Studies   Echocardiogram 02/19/2023:  1. Left ventricular ejection fraction, by estimation, is 50 to 55%. The  left ventricle has low normal function. The left ventricle has no regional  wall motion abnormalities. The left ventricular internal cavity size was  mildly dilated. Left ventricular  diastolic parameters are consistent with Grade I diastolic dysfunction  (impaired relaxation).   2. Right ventricular systolic function is normal. The right ventricular  size is normal.   3. Left atrial size was mildly dilated.   4. The mitral valve is abnormal. Trivial mitral valve regurgitation. No  evidence of mitral stenosis.   5. The aortic valve was not well visualized. There is moderate  calcification of the aortic valve.  There is moderate thickening of the  aortic valve. Aortic valve regurgitation is mild. Aortic valve  sclerosis/calcification is present, without any evidence   of aortic stenosis.   6. Aortic dilatation noted. There is mild dilatation of the aortic root,  measuring 40 mm.   7. The inferior vena cava is normal in size with greater than 50%  respiratory variability, suggesting right atrial pressure of 3 mmHg.    Assessment & Plan   1.  Chest pain and shortness of breath following recent onset of cough and some degree of orthopnea.  Chest CT suggests component of pulmonary edema.  No clear evidence of ACS with minor, flat elevation high-sensitivity troponin I.  BNP 135.  Follow-up echocardiogram shows low normal LVEF at 50 to 55%, normal RV contraction, trivial pericardial effusion.  No evidence  of pulmonary embolus or lower extremity DVT.  CRP and the ESR elevated, not necessarily pleuritic type chest discomfort and no rub on examination.  He feels better today and has diuresed net output of approximately 2500 cc last 48 hours.  2.  CAD with known CTO of the RCA documented in October 2022 and managed medically.  3.  Acute renal insufficiency, creatinine up to 1.59 from 1.47.  4.  PAD.  Anticipate discharge home today.  Continue aspirin, Imdur at 30 mg daily, Toprol-XL at 25 mg daily, Crestor at 20 mg daily.  Do not resume Cozaar at this time.  Resume prior Lasix at 40 mg daily and would add KCl 10 mEq daily.  Keep follow-up with nephrology.  Can arrange cardiology follow-up in the next few weeks.  For questions or updates, please contact [facility] Please consult [email or web address] for contact info under   Signed, Nona Dell, MD  02/20/2023, 10:34 AM

## 2023-02-20 NOTE — Plan of Care (Signed)
  Problem: Education: Goal: Knowledge of General Education information will improve Description: Including pain rating scale, medication(s)/side effects and non-pharmacologic comfort measures Outcome: Adequate for Discharge   Problem: Health Behavior/Discharge Planning: Goal: Ability to manage health-related needs will improve Outcome: Adequate for Discharge   Problem: Clinical Measurements: Goal: Ability to maintain clinical measurements within normal limits will improve Outcome: Adequate for Discharge Goal: Diagnostic test results will improve Outcome: Adequate for Discharge Goal: Cardiovascular complication will be avoided Outcome: Adequate for Discharge   Problem: Activity: Goal: Risk for activity intolerance will decrease Outcome: Adequate for Discharge   Problem: Safety: Goal: Ability to remain free from injury will improve Outcome: Adequate for Discharge   Problem: Skin Integrity: Goal: Risk for impaired skin integrity will decrease Outcome: Adequate for Discharge   Problem: Education: Goal: Ability to describe self-care measures that may prevent or decrease complications (Diabetes Survival Skills Education) will improve Outcome: Adequate for Discharge Goal: Individualized Educational Video(s) Outcome: Adequate for Discharge   Problem: Coping: Goal: Ability to adjust to condition or change in health will improve Outcome: Adequate for Discharge   Problem: Fluid Volume: Goal: Ability to maintain a balanced intake and output will improve Outcome: Adequate for Discharge   Problem: Health Behavior/Discharge Planning: Goal: Ability to identify and utilize available resources and services will improve Outcome: Adequate for Discharge Goal: Ability to manage health-related needs will improve Outcome: Adequate for Discharge   Problem: Metabolic: Goal: Ability to maintain appropriate glucose levels will improve Outcome: Adequate for Discharge   Problem:  Nutritional: Goal: Progress toward achieving an optimal weight will improve Outcome: Adequate for Discharge   Problem: Skin Integrity: Goal: Risk for impaired skin integrity will decrease Outcome: Adequate for Discharge   Problem: Tissue Perfusion: Goal: Adequacy of tissue perfusion will improve Outcome: Adequate for Discharge   Problem: Education: Goal: Ability to demonstrate management of disease process will improve Outcome: Adequate for Discharge Goal: Ability to verbalize understanding of medication therapies will improve Outcome: Adequate for Discharge Goal: Individualized Educational Video(s) Outcome: Adequate for Discharge   Problem: Activity: Goal: Capacity to carry out activities will improve Outcome: Adequate for Discharge   Problem: Cardiac: Goal: Ability to achieve and maintain adequate cardiopulmonary perfusion will improve Outcome: Adequate for Discharge   

## 2023-02-20 NOTE — Discharge Summary (Cosign Needed)
Name: Andres Richardson MRN: [id number] DOB: 1965/12/27 57 y.o. PCP: Andres Christians, DO  Date of Admission: 02/18/2023  3:48 AM Date of Discharge: 02/20/2023 2:33 PM Attending Physician: Dr. Oswaldo Done  Discharge Diagnosis: Principal Problem:   Flash pulmonary edema [facility])    Discharge Medications: Allergies as of 02/20/2023       Reactions   Adhesive [tape] Rash        Medication List     STOP taking these medications    atorvastatin 40 MG tablet Commonly known as: LIPITOR   losartan 25 MG tablet Commonly known as: Cozaar       TAKE these medications    acetaminophen 500 MG tablet Commonly known as: TYLENOL Take 1,000-1,500 mg by mouth 2 (two) times daily as needed for moderate pain.   aspirin EC 81 MG tablet Take 1 tablet (81 mg total) by mouth daily. Swallow whole.   Blood Glucose Monitoring Suppl Devi May substitute to any manufacturer covered by patient's insurance.   BLOOD GLUCOSE TEST STRIPS Strp 1 each by In Vitro route in the morning, at noon, and at bedtime. May substitute to any manufacturer covered by patient's insurance.   calcium carbonate 500 MG chewable tablet Commonly known as: TUMS - dosed in mg elemental calcium Chew 1 tablet by mouth at bedtime.   Dexcom G7 Sensor Misc USE AS DIRECTED ONCE A WEEK   DULoxetine 60 MG capsule Commonly known as: Cymbalta Take 1 capsule (60 mg total) by mouth daily.   furosemide 40 MG tablet Commonly known as: LASIX Take 1 tablet (40 mg total) by mouth daily.   gabapentin 100 MG capsule Commonly known as: Neurontin Take 1 capsule (100 mg total) by mouth 3 (three) times daily.   gentamicin ointment 0.1 % Commonly known as: GARAMYCIN Apply 1 Application topically daily. What changed:  when to take this additional instructions   insulin glargine 100 UNIT/ML Solostar Pen Commonly known as: LANTUS Inject 43 Units into the skin at bedtime.   insulin lispro 100 UNIT/ML KwikPen Commonly known as: HumaLOG  KwikPen Inject 11 Units into the skin 3 (three) times daily.   Iron (Ferrous Sulfate) 325 (65 Fe) MG Tabs Take 1 tablet by mouth daily.   isosorbide mononitrate 30 MG 24 hr tablet Commonly known as: IMDUR Take 1 tablet (30 mg total) by mouth daily. Start taking on: February 21, 2023 What changed: how much to take   Lancet Device Misc 1 each by Does not apply route in the morning, at noon, and at bedtime. May substitute to any manufacturer covered by patient's insurance.   Lancets Misc. Misc 1 each by Does not apply route in the morning, at noon, and at bedtime. May substitute to any manufacturer covered by patient's insurance.   Leg Cramps Tabs Take 3 tablets by mouth daily as needed (leg cramps).   metFORMIN 1000 MG tablet Commonly known as: GLUCOPHAGE Take 1 tablet (1,000 mg total) by mouth 2 (two) times daily with a meal.   metoprolol succinate 25 MG 24 hr tablet Commonly known as: TOPROL-XL Take 1 tablet (25 mg total) by mouth daily.   montelukast 10 MG tablet Commonly known as: SINGULAIR Take 1 tablet (10 mg total) by mouth daily.   multivitamin with minerals Tabs tablet Take 1 tablet by mouth daily.   nitroGLYCERIN 0.4 MG SL tablet Commonly known as: NITROSTAT Place 1 tablet (0.4 mg total) under the tongue every 5 (five) minutes as needed for chest pain.   pantoprazole 40  MG tablet Commonly known as: Protonix Take 1 tablet (40 mg total) by mouth daily.   Pen Needles 32G X 4 MM Misc 1 Units by Does not apply route 4 (four) times daily.   rosuvastatin 20 MG tablet Commonly known as: CRESTOR Take 1 tablet (20 mg total) by mouth daily. Start taking on: February 21, 2023   sertraline 25 MG tablet Commonly known as: Zoloft Take 1 tablet (25 mg total) by mouth daily.        Disposition and follow-up:   Andres Richardson was discharged from [facility] in Stable condition.  At the hospital follow up visit please address:  1.   Follow-up:  *Pulmonary edema, hypertension -Please note that Imdur dose is changed from 30mg  TID a day, to 30 mg daily -Please note that patient will be given Crestor 20 mg and we discontinued atorvastatin 40 mg -Please ensure adherence to hypertension medications -Please ensure patient follows up with cardiologist   *AKI, potential nephrotic syndrome -Serum creatinine is 1.59 on discharge -Please repeat BMP in 7 to 10 days, consider adding losartan depending on results -Please ensure patient follows up with nephrology appointment in August   *Anemia -Hemoglobin is 10.5 on discharge -Patient is asymptomatic -Please repeat CBC in 7 to 10 days    2.  Labs / imaging needed at time of follow-up: CBC and BMP in 7 to 10 days  3.  Pending labs/ test needing follow-up: N/A  4.  Medication Changes  STOPPED  -Atorvastatin 40 mg  -Losartan 12.5mg  twice daily     ADDED  -Rosuvastatin 20 mg   MODIFIED  -Isosorbide mononitrate, decreased to 30 mg 1 tablet, 30 mg TID (per cardiology note)     Follow-up Appointments:   Hospital Course by problem list:  #Flash Pumlomary edema The patient presented to the emergency department after he work up with heaviness in his chest and pleuritic chest pain. His blood pressure on presentation was 181/89 and was on 3L O2. His blood pressure and chest pain and oxygen requirements have improved after receiving IV lasix. Cardiology evaluated the patient and did not believe his chest pain was caused from ACS due to the decreasing troponins and stable EKG without significant change. His echocardiogram on 7/6 reveal an LVEF of 50-55%.  Prior to discharge, he was asymptomatic and physical exam was reassuring for euvolemic status.  He will be sent home on home regimen of 40 mg oral Lasix.   #AKI vs Acute injury on chronic kidney disease  His serum creatinine was elevated on arrival at 1.66. After receiving IV Diuretics, his creatine has lowered to 1.47.  His GFR has also improved after receiving the IV diuretics. Will require further work up to determine whether his AKI was in the setting of CKD or from HTN.  Previous urinalysis showed 4+ protein, patient reported having nephrology appointment in August. Losartan was held per cardiology, will recommend repeat BMP in 7 to 10 days.        Discharge Subjective:  Today, he stated that the chest pain and shortness of breath are resolved.  No longer endorses orthopnea.  He denies feeling lightheaded/dizzy, rapid heart rate, palpitations. We discussed low-sodium diets and avoiding prepackaged food.  He denies symptoms of anemia and hematuria, melena,hematochezia.   Reports he will see his nephrologist next month (03/28/23); he has not yet seen nephrology.  Discharge Exam:   Blood pressure (!) 130/90, pulse 81, temperature 98 F (36.7 C), temperature source Oral,  resp. rate 20, height 5\' 11"  (1.803 m), weight 96.6 kg, SpO2 98 %.  Constitutional:non ill-appearing, sitting in bed eating, in no acute distress Cardiovascular: regular rate and rhythm, no m/r/g, Pulmonary/Chest: normal work of breathing on room air, lungs clear to auscultation bilaterally. No crackles  Neurological: alert & oriented x 3 Abdominal: Ostomy present, no blood within collection bag  MSK: no gross abnormalities. No pitting edema Skin: warm and dry Psych: Normal mood and affect  Pertinent Labs, Studies, and Procedures:     Latest Ref Rng & Units 02/20/2023   12:58 AM 02/19/2023   12:41 AM 02/18/2023   11:38 AM  CBC  WBC 4.0 - 10.5 K/uL 7.4  10.0  7.2   Hemoglobin 13.0 - 17.0 g/dL 16.1  09.6  04.5   Hematocrit 39.0 - 52.0 % 31.7  37.3  34.9   Platelets 150 - 400 K/uL 229  247  209        Latest Ref Rng & Units 02/20/2023   12:58 AM 02/19/2023   12:41 AM 02/18/2023    2:29 PM  CMP  Glucose 70 - 99 mg/dL 409  81  811   BUN 6 - 20 mg/dL 39  29  27   Creatinine 0.61 - 1.24 mg/dL 9.14  7.82  9.56   Sodium 135 - 145 mmol/L 134   137  137   Potassium 3.5 - 5.1 mmol/L 3.8  3.6  3.8   Chloride 98 - 111 mmol/L 104  106  105   CO2 22 - 32 mmol/L 22  25  23    Calcium 8.9 - 10.3 mg/dL 7.7  8.0  7.9     ECHOCARDIOGRAM COMPLETE  Result Date: 02/19/2023    ECHOCARDIOGRAM REPORT   Patient Name:   JIMARI POMRENKE Date of Exam: 02/19/2023 Medical Rec #:  [id number]       Height:       71.0 in Accession #:    [id number]      Weight:       209.9 lb Date of Birth:  [birth year]       BSA:          2.152 m Patient Age:    57 years        BP:           120/68 mmHg Patient Gender: M               HR:           82 bpm. Exam Location:  Inpatient Procedure: 2D Echo, Color Doppler and Cardiac Doppler Indications:    CHF-Acute Diastolic I50.31  History:        Patient has prior history of Echocardiogram examinations, most                 recent 04/20/2022. CHF, CAD; Risk Factors:Hypertension,                 Dyslipidemia, Diabetes and Current Smoker.  Sonographer:    Dondra Prader RVT RCS Referring Phys: [phone] [facility]  Sonographer Comments: Suboptimal parasternal window. IMPRESSIONS  1. Left ventricular ejection fraction, by estimation, is 50 to 55%. The left ventricle has low normal function. The left ventricle has no regional wall motion abnormalities. The left ventricular internal cavity size was mildly dilated. Left ventricular diastolic parameters are consistent with Grade I diastolic dysfunction (impaired relaxation).  2. Right ventricular systolic function is normal. The right ventricular size is normal.  3. Left  atrial size was mildly dilated.  4. The mitral valve is abnormal. Trivial mitral valve regurgitation. No evidence of mitral stenosis.  5. The aortic valve was not well visualized. There is moderate calcification of the aortic valve. There is moderate thickening of the aortic valve. Aortic valve regurgitation is mild. Aortic valve sclerosis/calcification is present, without any evidence  of aortic stenosis.  6. Aortic dilatation  noted. There is mild dilatation of the aortic root, measuring 40 mm.  7. The inferior vena cava is normal in size with greater than 50% respiratory variability, suggesting right atrial pressure of 3 mmHg. FINDINGS  Left Ventricle: Left ventricular ejection fraction, by estimation, is 50 to 55%. The left ventricle has low normal function. The left ventricle has no regional wall motion abnormalities. The left ventricular internal cavity size was mildly dilated. There is no left ventricular hypertrophy. Left ventricular diastolic parameters are consistent with Grade I diastolic dysfunction (impaired relaxation). Right Ventricle: The right ventricular size is normal. No increase in right ventricular wall thickness. Right ventricular systolic function is normal. Left Atrium: Left atrial size was mildly dilated. Right Atrium: Right atrial size was normal in size. Pericardium: Trivial pericardial effusion is present. The pericardial effusion is posterior to the left ventricle. Mitral Valve: The mitral valve is abnormal. There is mild thickening of the mitral valve leaflet(s). Trivial mitral valve regurgitation. No evidence of mitral valve stenosis. Tricuspid Valve: The tricuspid valve is normal in structure. Tricuspid valve regurgitation is mild . No evidence of tricuspid stenosis. Aortic Valve: The aortic valve was not well visualized. There is moderate calcification of the aortic valve. There is moderate thickening of the aortic valve. Aortic valve regurgitation is mild. Aortic regurgitation PHT measures 623 msec. Aortic valve sclerosis/calcification is present, without any evidence of aortic stenosis. Aortic valve mean gradient measures 5.0 mmHg. Aortic valve peak gradient measures 9.0 mmHg. Aortic valve area, by VTI measures 2.49 cm. Pulmonic Valve: The pulmonic valve was normal in structure. Pulmonic valve regurgitation is trivial. No evidence of pulmonic stenosis. Aorta: Aortic dilatation noted. There is mild  dilatation of the aortic root, measuring 40 mm. Venous: The inferior vena cava is normal in size with greater than 50% respiratory variability, suggesting right atrial pressure of 3 mmHg. IAS/Shunts: No atrial level shunt detected by color flow Doppler.  LEFT VENTRICLE PLAX 2D LVIDd:         5.60 cm   Diastology LVIDs:         4.40 cm   LV e' medial:    5.55 cm/s LV PW:         1.10 cm   LV E/e' medial:  13.3 LV IVS:        1.00 cm   LV e' lateral:   5.98 cm/s LVOT diam:     2.20 cm   LV E/e' lateral: 12.4 LV SV:         70 LV SV Index:   33 LVOT Area:     3.80 cm  RIGHT VENTRICLE RV Basal diam:  4.20 cm RV S prime:     11.10 cm/s TAPSE (M-mode): 2.8 cm LEFT ATRIUM             Index        RIGHT ATRIUM           Index LA diam:        3.60 cm 1.67 cm/m   RA Area:     13.20 cm LA Vol (A2C):   72.8  ml 33.83 ml/m  RA Volume:   33.80 ml  15.70 ml/m LA Vol (A4C):   71.3 ml 33.13 ml/m LA Biplane Vol: 87.1 ml 40.47 ml/m  AORTIC VALVE                     PULMONIC VALVE AV Area (Vmax):    2.58 cm      PV Vmax:       0.55 m/s AV Area (Vmean):   2.42 cm      PV Peak grad:  1.2 mmHg AV Area (VTI):     2.49 cm AV Vmax:           150.00 cm/s AV Vmean:          102.000 cm/s AV VTI:            0.282 m AV Peak Grad:      9.0 mmHg AV Mean Grad:      5.0 mmHg LVOT Vmax:         102.00 cm/s LVOT Vmean:        65.000 cm/s LVOT VTI:          0.185 m LVOT/AV VTI ratio: 0.66 AI PHT:            623 msec AR Vena Contracta: 0.60 cm  AORTA Ao Root diam: 4.00 cm Ao Asc diam:  3.20 cm MITRAL VALVE MV Area (PHT): 3.17 cm    SHUNTS MV Decel Time: 239 msec    Systemic VTI:  0.18 m MV E velocity: 73.90 cm/s  Systemic Diam: 2.20 cm MV A velocity: 94.90 cm/s MV E/A ratio:  0.78 Charlton Haws MD Electronically signed by Charlton Haws MD Signature Date/Time: 02/19/2023/5:45:54 PM    Final    VAS [name] LOWER EXTREMITY VENOUS (DVT)  Result Date: 02/19/2023  Lower Venous DVT Study Patient Name:  ANTONINE RATTERREE  Date of Exam:   02/18/2023 Medical Rec  #: [id number]        Accession #:    [id number] Date of Birth: April 20, 1966        Patient Gender: M Patient Age:   2 years Exam Location:  [facility] Procedure:      VAS [name] LOWER EXTREMITY VENOUS (DVT) Referring Phys: Klani Caridi MULLEN --------------------------------------------------------------------------------  Indications: Edema. Other Indications: Extensive arterial history. Comparison Study: No prior venous studies. Performing Technologist: Jean Rosenthal RDMS, RVT  Examination Guidelines: A complete evaluation includes B-mode imaging, spectral Doppler, color Doppler, and power Doppler as needed of all accessible portions of each vessel. Bilateral testing is considered an integral part of a complete examination. Limited examinations for reoccurring indications may be performed as noted. The reflux portion of the exam is performed with the patient in reverse Trendelenburg.  +---------+---------------+---------+-----------+----------+--------------+ RIGHT    CompressibilityPhasicitySpontaneityPropertiesThrombus Aging +---------+---------------+---------+-----------+----------+--------------+ CFV      Full           Yes      Yes                                 +---------+---------------+---------+-----------+----------+--------------+ SFJ      Full                                                        +---------+---------------+---------+-----------+----------+--------------+  FV Prox  Full                                                        +---------+---------------+---------+-----------+----------+--------------+ FV Mid   Full                                                        +---------+---------------+---------+-----------+----------+--------------+ FV DistalFull                                                        +---------+---------------+---------+-----------+----------+--------------+ PFV      Full                                                         +---------+---------------+---------+-----------+----------+--------------+ POP      Full           Yes      Yes                                 +---------+---------------+---------+-----------+----------+--------------+ PTV      Full                                                        +---------+---------------+---------+-----------+----------+--------------+ PERO     Full                                                        +---------+---------------+---------+-----------+----------+--------------+   +---------+---------------+---------+-----------+----------+--------------+ LEFT     CompressibilityPhasicitySpontaneityPropertiesThrombus Aging +---------+---------------+---------+-----------+----------+--------------+ CFV      Full           Yes      Yes                                 +---------+---------------+---------+-----------+----------+--------------+ SFJ      Full                                                        +---------+---------------+---------+-----------+----------+--------------+ FV Prox  Full                                                        +---------+---------------+---------+-----------+----------+--------------+  FV Mid   Full                                                        +---------+---------------+---------+-----------+----------+--------------+ FV DistalFull                                                        +---------+---------------+---------+-----------+----------+--------------+ PFV      Full                                                        +---------+---------------+---------+-----------+----------+--------------+ POP      Full           Yes      Yes                                 +---------+---------------+---------+-----------+----------+--------------+ PTV      Full                                                         +---------+---------------+---------+-----------+----------+--------------+ PERO     Full                                                        +---------+---------------+---------+-----------+----------+--------------+     Summary: RIGHT: - There is no evidence of deep vein thrombosis in the lower extremity.  - No cystic structure found in the popliteal fossa.  LEFT: - There is no evidence of deep vein thrombosis in the lower extremity.  - No cystic structure found in the popliteal fossa.  *See table(s) above for measurements and observations. Electronically signed by Lemar Livings MD on 02/19/2023 at 10:31:19 AM.    Final    CT Angio Chest PE W and/or Wo Contrast  Result Date: 02/18/2023 CLINICAL DATA:  Short of breath and chest pain. EXAM: CT ANGIOGRAPHY CHEST WITH CONTRAST TECHNIQUE: Multidetector CT imaging of the chest was performed using the standard protocol during bolus administration of intravenous contrast. Multiplanar CT image reconstructions and MIPs were obtained to evaluate the vascular anatomy. RADIATION DOSE REDUCTION: This exam was performed according to the departmental dose-optimization program which includes automated exposure control, adjustment of the mA and/or kV according to patient size and/or use of iterative reconstruction technique. CONTRAST:  75mL OMNIPAQUE IOHEXOL 350 MG/ML SOLN COMPARISON:  10/12/2022 and 05/10/2021. FINDINGS: Cardiovascular: Pulmonary arteries are well opacified. There is no evidence of a pulmonary embolism. Heart is normal in size and configuration. No pericardial effusion. Three-vessel coronary artery calcifications. Great vessels are normal in caliber. No aortic dissection. Mild aortic atherosclerosis. Mediastinum/Nodes: No neck base, mediastinal or hilar masses.  Several prominent lymph nodes. Pre-vascular node measuring 1.1 cm short axis. Subcarinal node measuring 1.5 cm short axis, both larger than on the prior CT. Trachea and esophagus are unremarkable.  Lungs/Pleura: Mild bilateral interstitial thickening. Patchy ground-glass opacities are noted predominantly in the lower lobes. Bronchial wall thickening also noted predominantly in the lower lobes. Minor dependent atelectasis in the right lower lobe. Mild paraseptal and centrilobular emphysema most evident in the left upper lobe. No convincing pleural effusion.  No pneumothorax. Upper Abdomen: No acute abnormality. Musculoskeletal: No fracture or acute finding.  No bone lesion. Review of the MIP images confirms the above findings. IMPRESSION: 1. No evidence of a pulmonary embolism. 2. Lung findings with interstitial thickening and patchy ground-glass opacities. Suspect pulmonary edema. Infection should be considered in the proper clinical setting. 3. Three-vessel coronary artery calcifications. Aortic Atherosclerosis (ICD10-I70.0) and Emphysema (ICD10-J43.9). Electronically Signed   By: Amie Portland M.D.   On: 02/18/2023 10:19   DG Chest Portable 1 View  Result Date: 02/18/2023 CLINICAL DATA:  Chest pain, shortness of breath. EXAM: PORTABLE CHEST 1 VIEW COMPARISON:  10/01/2022. FINDINGS: The heart is mildly enlarged and mediastinal contours are stable. The pulmonary vasculature is distended. Lung volumes are low with patchy airspace disease at the lung bases. No effusion or pneumothorax. No acute osseous abnormality. IMPRESSION: 1. Cardiomegaly with pulmonary vascular congestion. 2. Patchy airspace disease at the lung bases, possible edema or infiltrate. Electronically Signed   By: Thornell Sartorius M.D.   On: 02/18/2023 04:28     Discharge Instructions: Discharge Instructions     (HEART FAILURE PATIENTS) Call MD:  Anytime you have any of the following symptoms: 1) 3 pound weight gain in 24 hours or 5 pounds in 1 week 2) shortness of breath, with or without a dry hacking cough 3) swelling in the hands, feet or stomach 4) if you have to sleep on extra pillows at night in order to breathe.   Complete by: As  directed    Call MD for:  difficulty breathing, headache or visual disturbances   Complete by: As directed    Call MD for:  extreme fatigue   Complete by: As directed    Call MD for:  hives   Complete by: As directed    Call MD for:  persistant dizziness or light-headedness   Complete by: As directed    Call MD for:  persistant nausea and vomiting   Complete by: As directed    Call MD for:  redness, tenderness, or signs of infection (pain, swelling, redness, odor or green/yellow discharge around incision site)   Complete by: As directed    Call MD for:  severe uncontrolled pain   Complete by: As directed    Call MD for:  temperature >100.4   Complete by: As directed    Diet - low sodium heart healthy   Complete by: As directed    Discharge instructions   Complete by: As directed    You came into the hospital with chest heaviness and difficulty breathing. Your blood pressure was very high and likely caused you to have Flash Pulmonary Edema (fluid in your lungs). We also noticed an increase in your kidney function called serum creatine along with a decrease in your blood count. We treated you with IV diuretic (Lasix) to remove fluid from your lungs.   *For your chest pain -Here are the new medications to start:    -Crestor (rosuvastatin) 20mg  -Here are the medications that doses have changed:   -  Imdur (isosorbide mononitrate)30 mg 24 tablet once a day -Here are the medications that were stopped:   -Atorvastatin (Lipitor) 40mg    -Imdur (isosorbide mononitrate) 30mg  3 times a day   -Losartan 12.5mg  (Cozaar) twice a day -Please continue you home medications as previously prescribed:    -Metoprolol succinate 25mg  (Toprolol XL 25mg ) once a day    -Oral Lasix (furosemide) 40mg  once a day   -Crestor (rosuvastatin)  20mg  once a day   -Please discontinue atorvastatin (Lipitor) 40mg       *For your high blood pressure (hypertension): -Please continue you home medications as previously  prescribed:    -Imdur 30 mg 24 tablet once a day   -Metroprolol succinate 25mg  (Toprolol XL 25mg ) once a day    -Crestor 20mg  once a day   *For the pulmonary edema -We treated you with a diuretic named Lasix in your IV -Please continue Oral Lasix (furosemide) 40mg  once a day   *For the Elevated kidney serum creatinine -Please see your nephrologist -Please avoid ibuprofen and Advil use  -We will recheck your kidney function during your next appointment   You have these follow upcoming appointments: -Primary care: [facility] will contact you to set up an appointment within the next 7-10 days -Cardiology: Dr. Jacques Navy on 05/12/2023, please call to schedule an appointment within the next 7-14 days -Please keep the appointment with the nephrologist (kidney doctor)  If you have any of the following symptoms, Please go to the EMERGENCY DEPARTMENT -Chest pain -Shortness of breath or difficulty breathing -A sudden increase in weight (more than 3 pounds in 24 hours) -Swelling of your ankles and legs -Dizziness  -Syncope (passing out) -Palpitations -Fast heart beat   We are glad that you are feeling better! It was a pleasure to care for you during your hospitalization.  If you have any questions or concerns, please call [phone]  Take care,  Faith Rogue DO   Increase activity slowly   Complete by: As directed    No wound care   Complete by: As directed        Signed: Faith Rogue DO [facility] - PGY1 Pager: [phone] 02/20/2023, 2:33 PM    Please contact the on call pager after 5 pm and on weekends at [phone].

## 2023-02-20 NOTE — Plan of Care (Signed)
  Problem: Education: Goal: Knowledge of General Education information will improve Description: Including pain rating scale, medication(s)/side effects and non-pharmacologic comfort measures Outcome: Adequate for Discharge   Problem: Health Behavior/Discharge Planning: Goal: Ability to manage health-related needs will improve Outcome: Adequate for Discharge   Problem: Clinical Measurements: Goal: Ability to maintain clinical measurements within normal limits will improve Outcome: Adequate for Discharge Goal: Diagnostic test results will improve Outcome: Adequate for Discharge Goal: Cardiovascular complication will be avoided Outcome: Adequate for Discharge   Problem: Activity: Goal: Risk for activity intolerance will decrease Outcome: Adequate for Discharge   Problem: Safety: Goal: Ability to remain free from injury will improve Outcome: Adequate for Discharge   Problem: Skin Integrity: Goal: Risk for impaired skin integrity will decrease Outcome: Adequate for Discharge   Problem: Education: Goal: Ability to describe self-care measures that may prevent or decrease complications (Diabetes Survival Skills Education) will improve Outcome: Adequate for Discharge Goal: Individualized Educational Video(s) Outcome: Adequate for Discharge   Problem: Coping: Goal: Ability to adjust to condition or change in health will improve Outcome: Adequate for Discharge   Problem: Fluid Volume: Goal: Ability to maintain a balanced intake and output will improve Outcome: Adequate for Discharge   Problem: Health Behavior/Discharge Planning: Goal: Ability to identify and utilize available resources and services will improve Outcome: Adequate for Discharge Goal: Ability to manage health-related needs will improve Outcome: Adequate for Discharge   Problem: Metabolic: Goal: Ability to maintain appropriate glucose levels will improve Outcome: Adequate for Discharge   Problem:  Nutritional: Goal: Progress toward achieving an optimal weight will improve Outcome: Adequate for Discharge   Problem: Skin Integrity: Goal: Risk for impaired skin integrity will decrease Outcome: Adequate for Discharge   Problem: Tissue Perfusion: Goal: Adequacy of tissue perfusion will improve Outcome: Adequate for Discharge   Problem: Education: Goal: Ability to demonstrate management of disease process will improve Outcome: Adequate for Discharge Goal: Ability to verbalize understanding of medication therapies will improve Outcome: Adequate for Discharge Goal: Individualized Educational Video(s) Outcome: Adequate for Discharge   Problem: Activity: Goal: Capacity to carry out activities will improve Outcome: Adequate for Discharge   Problem: Cardiac: Goal: Ability to achieve and maintain adequate cardiopulmonary perfusion will improve Outcome: Adequate for Discharge

## 2023-02-20 NOTE — Progress Notes (Signed)
Pt Discharge instructions given, Tele,IV Removed. Friend transporting home.

## 2023-02-21 ENCOUNTER — Other Ambulatory Visit: Payer: Self-pay | Admitting: Student

## 2023-02-21 ENCOUNTER — Telehealth: Payer: Self-pay

## 2023-02-21 DIAGNOSIS — E876 Hypokalemia: Secondary | ICD-10-CM

## 2023-02-21 MED ORDER — POTASSIUM CHLORIDE CRYS ER 10 MEQ PO TBCR
10.0000 meq | EXTENDED_RELEASE_TABLET | Freq: Every day | ORAL | 1 refills | Status: DC
Start: 2023-02-21 — End: 2023-06-06

## 2023-02-21 NOTE — Progress Notes (Signed)
Patient called about potassium prescription being sent to [facility] pharmacy [phone] on [address], [place], [place]. Left patient a voicemail to pick up prescription and directions on how to take it.   Prescription: Potassium chloride tablet - , once daily  Morene Crocker, MD [facility] Medicine Program - PGY-2 02/21/2023, 12:38 PM Pager# [phone]   Please contact the on call pager after 5 pm and on weekends at [phone].

## 2023-02-21 NOTE — Telephone Encounter (Signed)
Left message for patient to call [facility] to set up account.

## 2023-02-21 NOTE — Transitions of Care (Post Inpatient/ED Visit) (Unsigned)
   02/21/2023  Name: Andres Richardson MRN: [id number] DOB: 01-Sep-1965  Today's TOC FU Call Status: Today's TOC FU Call Status:: Unsuccessul Call (1st Attempt) Unsuccessful Call (1st Attempt) Date: 02/21/23  Attempted to reach the patient regarding the most recent Inpatient/ED visit.  Follow Up Plan: Additional outreach attempts will be made to reach the patient to complete the Transitions of Care (Post Inpatient/ED visit) call.   Signature Karena Addison, LPN [facility] Nurse Health Advisor Direct Dial [phone]

## 2023-02-22 NOTE — Progress Notes (Signed)
Internal Medicine Clinic Attending  Case discussed with the resident at the time of the visit.  We reviewed the resident's history and exam and pertinent patient test results.  I agree with the assessment, diagnosis, and plan of care documented in the resident's note.  

## 2023-02-22 NOTE — Transitions of Care (Post Inpatient/ED Visit) (Unsigned)
   02/22/2023  Name: Andres Richardson MRN: [id number] DOB: 09-21-1965  Today's TOC FU Call Status: Today's TOC FU Call Status:: Unsuccessful Call (2nd Attempt) Unsuccessful Call (1st Attempt) Date: 02/21/23 Unsuccessful Call (2nd Attempt) Date: 02/22/23  Attempted to reach the patient regarding the most recent Inpatient/ED visit.  Follow Up Plan: Additional outreach attempts will be made to reach the patient to complete the Transitions of Care (Post Inpatient/ED visit) call.   Signature Karena Addison, LPN [facility] Nurse Health Advisor Direct Dial [phone]

## 2023-02-23 NOTE — Transitions of Care (Post Inpatient/ED Visit) (Signed)
   02/23/2023  Name: Andres Richardson MRN: [id number] DOB: [birth year]  Today's TOC FU Call Status: Today's TOC FU Call Status:: Unsuccessful Call (3rd Attempt) Unsuccessful Call (1st Attempt) Date: 02/21/23 Unsuccessful Call (2nd Attempt) Date: 02/22/23 Unsuccessful Call (3rd Attempt) Date: 02/23/23  Attempted to reach the patient regarding the most recent Inpatient/ED visit.  Follow Up Plan: No further outreach attempts will be made at this time. We have been unable to contact the patient.  Signature Karena Addison, LPN [facility] Nurse Health Advisor Direct Dial [phone]

## 2023-03-02 ENCOUNTER — Encounter: Payer: Medicare Other | Admitting: Internal Medicine

## 2023-03-02 ENCOUNTER — Ambulatory Visit: Payer: Medicare Other | Admitting: Podiatry

## 2023-03-02 NOTE — Progress Notes (Deleted)
  AKI with nephrotic Consider adding back losartan Baseline 0.9-1. Elevated at 1.6 during admission thought to be cardiorenal. Improved with IV diuresis.  -nephro follow-up in August.  Asa 81 Crestor 20 mg   Dm Dexcom Glargine 43 units Lispro 11 untis TID Metformin 1000 mg bid   Duloxetine Gabapentin 100 tid  IMDUr 30 qd Metoprolol 25 mg  Cards follo-wup?   Furosemid e40mg   K 10 meq   Iron 325 mg daily

## 2023-03-02 NOTE — Progress Notes (Signed)
 Reviewed

## 2023-03-04 ENCOUNTER — Ambulatory Visit: Payer: Self-pay | Admitting: Vascular Surgery

## 2023-03-04 ENCOUNTER — Telehealth (INDEPENDENT_AMBULATORY_CARE_PROVIDER_SITE_OTHER): Payer: Self-pay | Admitting: Vascular Surgery
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
## 2023-03-08 NOTE — Telephone Encounter (Addendum)
Left patient a voicemail for return call call to [facility], Dexcom G7 sensors were delivered on 02/25/23.

## 2023-03-09 ENCOUNTER — Ambulatory Visit (INDEPENDENT_AMBULATORY_CARE_PROVIDER_SITE_OTHER): Payer: Medicare Other | Admitting: Podiatry

## 2023-03-09 ENCOUNTER — Encounter: Payer: Self-pay | Admitting: Podiatry

## 2023-03-09 VITALS — BP 139/62 | HR 95 | Ht 71.0 in

## 2023-03-09 DIAGNOSIS — E0843 Diabetes mellitus due to underlying condition with diabetic autonomic (poly)neuropathy: Secondary | ICD-10-CM | POA: Diagnosis not present

## 2023-03-09 DIAGNOSIS — L97512 Non-pressure chronic ulcer of other part of right foot with fat layer exposed: Secondary | ICD-10-CM | POA: Diagnosis not present

## 2023-03-09 DIAGNOSIS — E119 Type 2 diabetes mellitus without complications: Secondary | ICD-10-CM | POA: Diagnosis not present

## 2023-03-09 MED ORDER — GENTAMICIN SULFATE 0.1 % EX OINT: 1.0000 | TOPICAL_OINTMENT | Freq: Every day | CUTANEOUS | 3 refills | Status: DC

## 2023-03-09 NOTE — Progress Notes (Signed)
No chief complaint on file.   Subjective:  57 y.o. male with PMHx of diabetes mellitus presenting today for follow-up evaluation of right foot ulcer.  He has been changing the dressings at home.  Overall improvement.  Presenting for further treatment and evaluation   Past Medical History:  Diagnosis Date   CAD (coronary artery disease) 2022     Prox RCA lesion is 100% stenosed.   Prox Cx to Mid Cx lesion is 60% stenosed.   LPAV lesion is 60% stenosed.   Ost LAD to Prox LAD lesion is 40% stenosed.   Diabetes mellitus without complication (HCC)    Type !! 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 Diabetic neuropathy.   Fournier gangrene 2022   Grade I diastolic dysfunction 12/02/2021   History of creation of ostomy [facility])    Hyperlipidemia    Mixed   Hypertension    Essential   Obesity    Peripheral arterial disease (HCC)    occluded left common iliac artery   Proteinuria    Tobacco use disorder     Past Surgical History:  Procedure Laterality Date   BIOPSY  12/04/2021   Procedure: BIOPSY;  Surgeon: Kerin Salen, MD;  Location: [facility];  Service: Gastroenterology;;   ESOPHAGOGASTRODUODENOSCOPY (EGD) WITH PROPOFOL N/A 12/04/2021   Procedure: ESOPHAGOGASTRODUODENOSCOPY (EGD) WITH PROPOFOL;  Surgeon: Kerin Salen, MD;  Location: [facility];  Service: Gastroenterology;  Laterality: N/A;   INCISION AND DRAINAGE ABSCESS N/A 02/24/2021   Procedure: INCISION AND DRAINAGE PERINEUM;  Surgeon: Sheliah Hatch, De Blanch, MD;  Location: [facility] OR[facility];  Service: General;  Laterality: N/A;   INCISION AND DRAINAGE ABSCESS N/A 02/25/2021   Procedure: REPEAT WASHOUT OF PERINEUM;  Surgeon: Sheliah Hatch De Blanch, MD;  Location: [facility];  Service: General;  Laterality: N/A;   LAPAROSCOPIC DIVERTED COLOSTOMY N/A 02/25/2021   Procedure: LAPAROSCOPIC DIVERTING SIGMOID COLOSTOMY;  Surgeon: [name], MD;  Location: [facility];  Service: General;  Laterality: N/A;   LEFT HEART CATH AND CORONARY  ANGIOGRAPHY N/A 05/18/2021   Procedure: LEFT HEART CATH AND CORONARY ANGIOGRAPHY;  Surgeon: Runell Gess, MD;  Location: [facility];  Service: Cardiovascular;  Laterality: N/A;    Allergies  Allergen Reactions   Adhesive [Tape] Rash    RT foot 02/09/2023   Objective/Physical Exam General: The patient is alert and oriented x3 in no acute distress.  Dermatology:  Wound #1 noted to the plantar aspect of the first MTP right foot measuring approximately 1.2 x 1.6 x 0.2 cm (LxWxD).   To the noted ulceration(s), there is no eschar. There is a moderate amount of slough, fibrin, and necrotic tissue noted. Granulation tissue and wound base is red. There is a minimal amount of serosanguineous drainage noted. There is no exposed bone muscle-tendon ligament or joint. There is no malodor. Periwound integrity is intact. Skin is warm, dry and supple bilateral lower extremities.  Vascular: VAS [name] ABI W/WO TBI 01/21/2023 ABI Findings:  +---------+------------------+-----+----------+--------+  Right   Rt Pressure (mmHg)IndexWaveform  Comment   +---------+------------------+-----+----------+--------+  Brachial 185                                        +---------+------------------+-----+----------+--------+  PTA     82                0.44 monophasic          +---------+------------------+-----+----------+--------+  DP  76                0.41 monophasic          +---------+------------------+-----+----------+--------+  Great Toe38                0.21 Abnormal            +---------+------------------+-----+----------+--------+   +---------+------------------+-----+----------+-------+  Left    Lt Pressure (mmHg)IndexWaveform  Comment  +---------+------------------+-----+----------+-------+  Brachial 170                                       +---------+------------------+-----+----------+-------+  PTA     71                0.38 monophasic          +---------+------------------+-----+----------+-------+  DP      68                0.37 monophasic         +---------+------------------+-----+----------+-------+  Great Toe49                0.26 Abnormal           +---------+------------------+-----+----------+-------+   +-------+-----------+-----------+------------+------------+  ABI/TBIToday's ABIToday's TBIPrevious ABIPrevious TBI  +-------+-----------+-----------+------------+------------+  Right 0.44       0.21       0.71        0.49          +-------+-----------+-----------+------------+------------+  Left  0.38       0.26       0.51        0.42          +-------+-----------+-----------+------------+------------+  Bilateral ABIs appear decreased compared to prior study on 05/07/22.    Summary:  Right: Resting right ankle-brachial index indicates severe right lower  extremity arterial disease. The right toe-brachial index is abnormal.  Left: Resting left ankle-brachial index indicates severe left lower  extremity arterial disease. The left toe-brachial index is abnormal.   Neurological: Light touch and protective threshold diminished bilaterally.   Musculoskeletal Exam: No pedal deformity.  No prior amputations  Assessment: 1.  Ulcer plantar aspect of the first MTP right secondary to diabetes mellitus 2. diabetes mellitus w/ peripheral neuropathy 3.  PAD B/L lower extremities   Plan of Care:  -Patient was evaluated.  Comprehensive diabetic foot exam performed today -Medically necessary excisional debridement including subcutaneous tissue was performed using a tissue nipper and a chisel blade. Excisional debridement of all the necrotic nonviable tissue down to healthy bleeding viable tissue was performed with post-debridement measurements same as pre-. -Continue same dressing change regimen and offloading.   -Gentamicin ointment refill prescribed -Appointment with vascular scheduled for  03/04/2023 -Return to clinic 3 weeks with Dr. Lilian Kapur   Felecia Shelling, DPM [facility]  Dr. Felecia Shelling, DPM    [address] [place], [place] [address]                Office [phone]  Fax [phone]

## 2023-03-16 ENCOUNTER — Encounter: Payer: Self-pay | Admitting: Student

## 2023-03-16 ENCOUNTER — Ambulatory Visit (INDEPENDENT_AMBULATORY_CARE_PROVIDER_SITE_OTHER): Payer: Medicare Other | Admitting: Student

## 2023-03-16 VITALS — BP 150/76 | HR 92 | Wt 218.0 lb

## 2023-03-16 DIAGNOSIS — Z794 Long term (current) use of insulin: Secondary | ICD-10-CM

## 2023-03-16 DIAGNOSIS — I10 Essential (primary) hypertension: Secondary | ICD-10-CM

## 2023-03-16 DIAGNOSIS — D649 Anemia, unspecified: Secondary | ICD-10-CM | POA: Insufficient documentation

## 2023-03-16 DIAGNOSIS — G8929 Other chronic pain: Secondary | ICD-10-CM

## 2023-03-16 DIAGNOSIS — I5032 Chronic diastolic (congestive) heart failure: Secondary | ICD-10-CM | POA: Diagnosis not present

## 2023-03-16 DIAGNOSIS — E1165 Type 2 diabetes mellitus with hyperglycemia: Secondary | ICD-10-CM

## 2023-03-16 DIAGNOSIS — N179 Acute kidney failure, unspecified: Secondary | ICD-10-CM | POA: Diagnosis not present

## 2023-03-16 DIAGNOSIS — R0789 Other chest pain: Secondary | ICD-10-CM

## 2023-03-16 DIAGNOSIS — I110 Hypertensive heart disease with heart failure: Secondary | ICD-10-CM | POA: Diagnosis not present

## 2023-03-16 MED ORDER — GABAPENTIN 100 MG PO CAPS: 200.0000 mg | ORAL_CAPSULE | Freq: Three times a day (TID) | ORAL | 3 refills | Status: DC

## 2023-03-16 MED ORDER — INSULIN GLARGINE 100 UNIT/ML SOLOSTAR PEN
25.0000 [IU] | PEN_INJECTOR | Freq: Two times a day (BID) | SUBCUTANEOUS | 11 refills | Status: DC
Start: 2023-03-16 — End: 2023-06-06

## 2023-03-16 MED ORDER — AMLODIPINE BESYLATE 5 MG PO TABS: 5.0000 mg | ORAL_TABLET | Freq: Every day | ORAL | 11 refills | Status: DC
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
## 2023-03-16 NOTE — Assessment & Plan Note (Signed)
Patient discharged from the hospital earlier this month after an episode of flash pulmonary edema. Upon admission his Creatinine was 1.66 with a baseline around 1.1. Creatinine improved to 1.59 prior to discharge. Patient was instructed to discontinue Losartan given concern for AKI until follow-up BMP, but patient unaware and continued to take as prescribed. Patient has nephrology appointment tomorrow.  Patient instructed to discontinue Losartan pending BMP and nephrology recommendations. -Order BMP
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
## 2023-03-16 NOTE — Assessment & Plan Note (Addendum)
Patient has a history of hypertension managed with Losartan 12.5 mg/daily, Imdur 30 mg/daily in addition to Toprol-XL 25 mg/daily and Lasix 40 mg/daily (See Diastolic CHF). Notably, patient was instructed to discontinue Losartan upon discharge earlier this month given concern for AKI, but he was unaware of that instruction and has been taking it as prescribed. Blood pressure today is 150/76. Given refractory nature of blood pressure and the instruction to discontinue Losartan today until his BMP comes back (See AKI), Amlodipine 5 mg will be added. -Discontinue Losartan until BMP can be evaluated -Continue Imdur, Toprol-XL, and Lasix -Order Amlodipine 5 mg/daily -BMP ordered

## 2023-03-16 NOTE — Patient Instructions (Addendum)
Thank you for allowing me to be a part of your care team. Today we discussed:  1.I put in a new prescription for your long-acting insulin (Insuline Glargine/Lantus). You will now take 25 units in the morning and 25 units in the evening. Continue to take your short-acting insulin (insulin lispro/Humalog) as prescribed unless your blood sugar is running low. A good system is to check your blood sugar before eating, and if it is greater than 120, take your short-acting insulin. 2. STOP taking Losartan for now until we get your blood work back. 3. START taking Amlodipine 5 mg/daily. I put in a prescription for this today. 4. I put in a prescription for Gabapentin 200 mg 3 times/day. Feel free to take two of the 100 mg pills 3 times/day until the current bottle runs out. 5. I have attached a document about high blood sugar that you might find helpful 5. Please let me know if you have any questions/concerns before your 4-week follow-up.

## 2023-03-16 NOTE — Assessment & Plan Note (Addendum)
Patient has history of diastolic heart failure managed with Toprol-XL 25 mg/daily, Lasix 40 mg/daily, and Losartan 12.5 mg/daily. Patient follows with cardiology and has an appointment later this month. There is mild bilateral LEE on exam, but patient states this has been the same for years. -Continue Toprol-XL and Lasix -Discontinue Losartan (see hypertension)

## 2023-03-16 NOTE — Assessment & Plan Note (Signed)
Patient does not have documented history of anemia, but Hgb found to be 12.5 on admission 02/18/2023 and 10.9 upon discharge 02/20/2023. Anemia was normocytic. -Order CBC

## 2023-03-17 ENCOUNTER — Telehealth: Payer: Self-pay

## 2023-03-17 ENCOUNTER — Other Ambulatory Visit: Payer: Self-pay | Admitting: Student

## 2023-03-17 MED ORDER — LOSARTAN POTASSIUM 25 MG PO TABS: 12.5000 mg | ORAL_TABLET | Freq: Every day | ORAL | 11 refills | Status: DC

## 2023-03-17 NOTE — Telephone Encounter (Signed)
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  KEY:[id number]

## 2023-03-17 NOTE — Progress Notes (Signed)
Patient's creatinine has normalized to 1.15 and eGFR is 74 which points to an AKI rather than progression of CKD. Patient received IV lasix which could certainly have contributed. Patient's glucose is 316 so hopefully the updated regimen will stabilize this. Hgb has increased to 11.6 (10.5 upon discharge 02/20/23). He has many fluctuations over the years with several measurements in the 9's. Iron panel in 2023 unremarkable. This points more to an anemia of chronic diease.

## 2023-03-17 NOTE — Telephone Encounter (Signed)
Decision:Approved Susa Loffler (Key: [id number]) PA Case ID #: [id number]  RX #: [id number] Drug Lantus Solostar 100UNIT/ML pen-injectors Form [facility] Medicare Electronic Prior Authorization Request Form [phone] NCPDP) Original Claim Info [id number] Approved today by [facility] Medicare 2017 This drug has been approved under the Member's Medicare Part D benefit. Approved quantity: 15 units per 30 days. You may fill up to a 90 day supply except for those on Specialty Tier 5, which can be filled up to a 30 day supply. Please call the pharmacy to process the prescription claim.

## 2023-03-21 NOTE — Progress Notes (Signed)
Internal Medicine Clinic Attending  I was physically present during the key portions of the resident provided service and participated in the medical decision making of patient's management care. I reviewed pertinent patient test results.  The assessment, diagnosis, and plan were formulated together and I agree with the documentation in the resident's note.  Based on patient's symptoms and improvement with gabapentin, will plan to optimize this regimen and see if chest wall pain improves.   Inez Catalina, MD

## 2023-03-23 ENCOUNTER — Other Ambulatory Visit (INDEPENDENT_AMBULATORY_CARE_PROVIDER_SITE_OTHER): Payer: Medicare Other

## 2023-03-23 DIAGNOSIS — Z794 Long term (current) use of insulin: Secondary | ICD-10-CM | POA: Diagnosis not present

## 2023-03-23 DIAGNOSIS — E1165 Type 2 diabetes mellitus with hyperglycemia: Secondary | ICD-10-CM

## 2023-03-23 LAB — LIPID PANEL
Cholesterol: 123 mg/dL (ref 0–200)
HDL: 30.6 mg/dL — ABNORMAL LOW (ref >39.00)
NonHDL: 92.54
Total CHOL/HDL Ratio: 4
Triglycerides: 244 mg/dL — ABNORMAL HIGH (ref 0.0–149.0)
VLDL: 48.8 mg/dL — ABNORMAL HIGH (ref 0.0–40.0)

## 2023-03-23 LAB — COMPREHENSIVE METABOLIC PANEL
ALT: 8 U/L (ref 0–53)
AST: 10 U/L (ref 0–37)
Albumin: 3.3 g/dL — ABNORMAL LOW (ref 3.5–5.2)
Alkaline Phosphatase: 84 U/L (ref 39–117)
BUN: 33 mg/dL — ABNORMAL HIGH (ref 6–23)
CO2: 23 mEq/L (ref 19–32)
Calcium: 8.8 mg/dL (ref 8.4–10.5)
Chloride: 102 mEq/L (ref 96–112)
Creatinine, Ser: 1.54 mg/dL — ABNORMAL HIGH (ref 0.40–1.50)
GFR: 49.75 mL/min — ABNORMAL LOW (ref >60.00)
Glucose, Bld: 482 mg/dL — ABNORMAL HIGH (ref 70–99)
Potassium: 4 mEq/L (ref 3.5–5.1)
Sodium: 134 mEq/L — ABNORMAL LOW (ref 135–145)
Total Bilirubin: 0.3 mg/dL (ref 0.2–1.2)
Total Protein: 6.9 g/dL (ref 6.0–8.3)

## 2023-03-23 LAB — LDL CHOLESTEROL, DIRECT: Direct LDL: 60 mg/dL

## 2023-03-23 LAB — HEMOGLOBIN A1C: Hgb A1c MFr Bld: 10.3 % — ABNORMAL HIGH (ref 4.6–6.5)

## 2023-03-28 ENCOUNTER — Encounter: Payer: Self-pay | Admitting: "Endocrinology

## 2023-03-28 ENCOUNTER — Ambulatory Visit: Payer: Medicare Other | Admitting: "Endocrinology

## 2023-03-28 DIAGNOSIS — Z794 Long term (current) use of insulin: Secondary | ICD-10-CM

## 2023-03-28 DIAGNOSIS — E7800 Pure hypercholesterolemia, unspecified: Secondary | ICD-10-CM | POA: Diagnosis not present

## 2023-03-28 DIAGNOSIS — E1165 Type 2 diabetes mellitus with hyperglycemia: Secondary | ICD-10-CM

## 2023-03-28 DIAGNOSIS — Z7984 Long term (current) use of oral hypoglycemic drugs: Secondary | ICD-10-CM

## 2023-03-28 MED ORDER — GVOKE HYPOPEN 1-PACK 1 MG/0.2ML ~~LOC~~ SOAJ: 1.0000 mg | SUBCUTANEOUS | 2 refills | Status: AC | PRN

## 2023-03-28 NOTE — Patient Instructions (Addendum)
-  Lantus 50 units once every night. Increase by 1 unit a every day until fasting blood sugar is less than 120. Stay on that dose.   -Take Humalog 11 units three times a day fifteen min before meals.  Correction scale: Use in addition to your meal time/short acting insulin based on blood sugars as follows:  151 - 175: 1 unit 176 - 200: 2 units 201 - 225: 3 units 226 - 250: 4 units 251 - 275: 5 units 276 - 300: 6 units 301 - 325: 7 units 326 - 350: 8 units 351 - 375: 9 units 376 - 400: 10 units  -Metformin 1000 mg ONCE A DAY   _____________    Goals of DM therapy:  Morning Fasting blood sugar: 80-140  Blood sugar before meals: 80-140 Bed time blood sugar: 100-150  A1C <7%, limited only by hypoglycemia  1.Diabetes medications and their side effects discussed, including hypoglycemia    2. Check blood glucose:  a) Always check blood sugars before driving. Please see below (under hypoglycemia) on how to manage b) Check a minimum of 3 times/day or more as needed when having symptoms of hypoglycemia.   c) Try to check blood glucose before sleeping/in the middle of the night to ensure that it is remaining stable and not dropping less than 100 d) Check blood glucose more often if sick  3. Diet: a) 3 meals per day schedule b: Restrict carbs to 60-70 grams (4 servings) per meal c) Colorful vegetables - 3 servings a day, and low sugar fruit 2 servings/day Plate control method: 1/4 plate protein, 1/4 starch, 1/2 green, yellow, or red vegetables d) Avoid carbohydrate snacks unless hypoglycemic episode, or increased physical activity  4. Regular exercise as tolerated, preferably 3 or more hours a week  5. Hypoglycemia: a)  Do not drive or operate machinery without first testing blood glucose to assure it is over 90 mg%, or if dizzy, lightheaded, not feeling normal, etc, or  if foot or leg is numb or weak. b)  If blood glucose less than 70, take four 5gm Glucose tabs or 15-30 gm Glucose  gel.  Repeat every 15 min as needed until blood sugar is >100 mg/dl. If hypoglycemia persists then call 911.   6. Sick day management: a) Check blood glucose more often b) Continue usual therapy if blood sugars are elevated.   7. Contact the doctor immediately if blood glucose is frequently <60 mg/dl, or an episode of severe hypoglycemia occurs (where someone had to give you glucose/  glucagon or if you passed out from a low blood glucose), or if blood glucose is persistently >350 mg/dl, for further management  8. A change in level of physical activity or exercise and a change in diet may also affect your blood sugar. Check blood sugars more often and call if needed.  Instructions: 1. Bring glucose meter, blood glucose records on every visit for review 2. Continue to follow up with primary care physician and other providers for medical care 3. Yearly eye  and foot exam 4. Please get blood work done prior to the next appointment

## 2023-03-28 NOTE — Progress Notes (Signed)
Outpatient Endocrinology Note Altamese [place], MD  03/28/23   Andres Richardson Jan 13, 1966 [id number]  Referring Provider: Plyler, Cecil Cranker, RD Primary Care Provider: Rudene Christians, DO Reason for consultation: Subjective   Assessment & Plan  Diagnoses and all orders for this visit:  Uncontrolled type 2 diabetes mellitus with hyperglycemia (HCC)  Long term (current) use of oral hypoglycemic drugs  Pure hypercholesterolemia  Long-term insulin use (HCC)  Other orders -     Glucagon (GVOKE HYPOPEN 1-PACK) 1 MG/0.2ML SOAJ; Inject 1 mg into the skin as needed (low blood sugar with impaired consciousness).    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 Increase by 1 unit a every day until fasting blood sugar is less than 120. Stay on that dose.   -Take Humalog 11 units three times a day fifteen min before meals.  Correction scale: Use in addition to your meal time/short acting insulin based on blood sugars as follows:  151 - 175: 1 unit 176 - 200: 2 units 201 - 225: 3 units 226 - 250: 4 units 251 - 275: 5 units 276 - 300: 6 units 301 - 325: 7 units 326 - 350: 8 units 351 - 375: 9 units 376 - 400: 10 units  -Metformin 1000 mg ONCE A DAY  No known contraindications/side effects to any of above medications Glucagon discussed and prescribed with refills on 03/28/23  -Last LD and Tg are as follows: Lab Results  Component Value Date   LDLCALC 78 02/16/2023    Lab Results  Component Value Date   TRIG 244.0 (H) 03/23/2023   -On rosuvastatin 20 mg QD -Follow low fat diet and exercise   -Blood pressure goal <140/90 - Microalbumin/creatinine goal is < 30 -Last MA/Cr is as follows: No results found for:  "MICROALBUR", "MALB24HUR" -on ACE/ARB losartan 25 mg qd -diet changes including salt restriction -limit eating outside -counseled BP targets per standards of diabetes care -uncontrolled blood pressure can lead to retinopathy, nephropathy and cardiovascular and atherosclerotic heart disease  Reviewed and counseled on: -A1C target -Blood sugar targets -Complications of uncontrolled diabetes  -Checking blood sugar before meals and bedtime and bring log next visit -All medications with mechanism of action and side effects -Hypoglycemia management: rule of 15's, Glucagon Emergency Kit and medical alert ID -low-carb low-fat plate-method diet -At least 20 minutes of physical activity per day -Annual dilated retinal eye exam and foot exam -compliance and follow up needs -follow up as scheduled or earlier if problem gets worse  Call if blood sugar is less than 70 or consistently above 250    Take a 15 gm snack of carbohydrate at bedtime before you go to sleep if your blood sugar is less than 100.    If you are going to fast after midnight for a test or procedure, ask your physician for instructions on how to reduce/decrease your insulin dose.    Call if blood sugar is less than 70 or consistently above 250  -Treating a low sugar by rule of 15  (15 gms of sugar every 15 min until sugar is more than 70) If you feel your sugar is low, test your sugar to be sure If your sugar is  low (less than 70), then take 15 grams of a fast acting Carbohydrate (3-4 glucose tablets or glucose gel or 4 ounces of juice or regular soda) Recheck your sugar 15 min after treating low to make sure it is more than 70 If sugar is still less than 70, treat again with 15 grams of carbohydrate          Don't drive the hour of hypoglycemia  If unconscious/unable to eat or drink by mouth, use glucagon injection or nasal spray baqsimi and call 911. Can repeat again in 15 min if still unconscious.  Return in about 8 weeks  (around 05/24/2023).   I have reviewed current medications, nurse's notes, allergies, vital signs, past medical and surgical history, family medical history, and social history for this encounter. Counseled patient on symptoms, examination findings, lab findings, imaging results, treatment decisions and monitoring and prognosis. The patient understood the recommendations and agrees with the treatment plan. All questions regarding treatment plan were fully answered.  Altamese [place], MD  03/28/23    History of Present Illness Andres Richardson is a 57 y.o. year old male who presents for evaluation of Type II diabetes mellitus.  TIRAN KOCHEL was first diagnosed around 68. Diabetes education +  Home diabetes regimen: Lantus 43 units qhs Humalog 11 units tidac  Metformin 1000 mg bid  COMPLICATIONS + CAD, -  MI/Stroke -  retinopathy +  neuropathy +  nephropathy  SYMPTOMS REVIEWED - Polyuria - Weight loss - Blurred vision  BLOOD SUGAR DATA  CGM interpretation: At today's visit, we reviewed her CGM downloads. The full report is scanned in the media. Reviewing the CGM trends, BG are elevated across the day.    Physical Exam  There were no vitals taken for this visit.   Constitutional: well developed, well nourished Head: normocephalic, atraumatic Eyes: sclera anicteric, no redness Neck: supple Lungs: normal respiratory effort Neurology: alert and oriented Skin: dry, no appreciable rashes Musculoskeletal: no appreciable defects Psychiatric: normal mood and affect Diabetic Foot Exam - Simple   No data filed      Current Medications Patient's Medications  New Prescriptions   GLUCAGON (GVOKE HYPOPEN 1-PACK) 1 MG/0.2ML SOAJ    Inject 1 mg into the skin as needed (low blood sugar with impaired consciousness).  Previous Medications   ACETAMINOPHEN (TYLENOL) 500 MG TABLET    Take 1,000-1,500 mg by mouth 2 (two) times daily as needed for moderate pain.   AMLODIPINE (NORVASC)  5 MG TABLET    Take 1 tablet (5 mg total) by mouth daily.   ASPIRIN EC 81 MG TABLET    Take 1 tablet (81 mg total) by mouth daily. Swallow whole.   BLOOD GLUCOSE MONITORING SUPPL DEVI    May substitute to any manufacturer covered by patient's insurance.   CALCIUM CARBONATE (TUMS - DOSED IN MG ELEMENTAL CALCIUM) 500 MG CHEWABLE TABLET    Chew 1 tablet by mouth at bedtime.   CONTINUOUS GLUCOSE SENSOR (DEXCOM G7 SENSOR) MISC    USE AS DIRECTED ONCE A WEEK   DULOXETINE (CYMBALTA) 60 MG CAPSULE    Take 1 capsule (60 mg total) by mouth daily.   FUROSEMIDE (LASIX) 40 MG TABLET    Take 1 tablet (40 mg total) by mouth daily.   GABAPENTIN (NEURONTIN) 100 MG CAPSULE    Take 2 capsules (200 mg total) by mouth 3 (three) times daily.   GENTAMICIN OINTMENT (GARAMYCIN) 0.1 %    Apply 1 Application topically daily.   GLUCOSE  BLOOD (BLOOD GLUCOSE TEST STRIPS) STRP    1 each by In Vitro route in the morning, at noon, and at bedtime. May substitute to any manufacturer covered by patient's insurance.   HOMEOPATHIC PRODUCTS (LEG CRAMPS) TABS    Take 3 tablets by mouth daily as needed (leg cramps).   INSULIN GLARGINE (LANTUS) 100 UNIT/ML SOLOSTAR PEN    Inject 25 Units into the skin 2 (two) times daily.   INSULIN LISPRO (HUMALOG KWIKPEN) 100 UNIT/ML KWIKPEN    Inject 11 Units into the skin 3 (three) times daily.   INSULIN PEN NEEDLE (PEN NEEDLES) 32G X 4 MM MISC    1 Units by Does not apply route 4 (four) times daily.   IRON, FERROUS SULFATE, 325 (65 FE) MG TABS    Take 1 tablet by mouth daily.   ISOSORBIDE MONONITRATE (IMDUR) 30 MG 24 HR TABLET    Take 1 tablet (30 mg total) by mouth daily.   LANCET DEVICE MISC    1 each by Does not apply route in the morning, at noon, and at bedtime. May substitute to any manufacturer covered by patient's insurance.   LOSARTAN (COZAAR) 25 MG TABLET    Take 0.5 tablets (12.5 mg total) by mouth daily.   METFORMIN (GLUCOPHAGE) 1000 MG TABLET    Take 1 tablet (1,000 mg total) by mouth 2  (two) times daily with a meal.   METOPROLOL SUCCINATE (TOPROL-XL) 25 MG 24 HR TABLET    Take 1 tablet (25 mg total) by mouth daily.   MONTELUKAST (SINGULAIR) 10 MG TABLET    Take 1 tablet (10 mg total) by mouth daily.   MULTIPLE VITAMIN (MULTIVITAMIN WITH MINERALS) TABS TABLET    Take 1 tablet by mouth daily.   NITROGLYCERIN (NITROSTAT) 0.4 MG SL TABLET    Place 1 tablet (0.4 mg total) under the tongue every 5 (five) minutes as needed for chest pain.   PANTOPRAZOLE (PROTONIX) 40 MG TABLET    Take 1 tablet (40 mg total) by mouth daily.   POTASSIUM CHLORIDE (KLOR-CON M) 10 MEQ TABLET    Take 1 tablet (10 mEq total) by mouth daily.   ROSUVASTATIN (CRESTOR) 20 MG TABLET    Take 1 tablet (20 mg total) by mouth daily.   SERTRALINE (ZOLOFT) 25 MG TABLET    Take 1 tablet (25 mg total) by mouth daily.  Modified Medications   No medications on file  Discontinued Medications   No medications on file    Allergies Allergies  Allergen Reactions   Adhesive [Tape] Rash    Past Medical History Past Medical History:  Diagnosis Date   CAD (coronary artery disease) 2022     Prox RCA lesion is 100% stenosed.   Prox Cx to Mid Cx lesion is 60% stenosed.   LPAV lesion is 60% stenosed.   Ost LAD to Prox LAD lesion is 40% stenosed.   Diabetes mellitus without complication (HCC)    Type !! 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 Diabetic neuropathy.   Fournier gangrene 2022   Grade I diastolic dysfunction 12/02/2021   Grade I diastolic dysfunction 12/02/2021   History of creation of ostomy [facility])    Hyperlipidemia    Mixed   Hypertension    Essential   Obesity    Peripheral arterial disease (HCC)    occluded left common iliac artery   Proteinuria    Tobacco use disorder     Past Surgical History Past Surgical History:  Procedure Laterality Date   BIOPSY  12/04/2021   Procedure: BIOPSY;  Surgeon: Kerin Salen, MD;  Location: [facility];  Service: Gastroenterology;;    ESOPHAGOGASTRODUODENOSCOPY (EGD) WITH PROPOFOL N/A 12/04/2021   Procedure: ESOPHAGOGASTRODUODENOSCOPY (EGD) WITH PROPOFOL;  Surgeon: Kerin Salen, MD;  Location: [facility];  Service: Gastroenterology;  Laterality: N/A;   INCISION AND DRAINAGE ABSCESS N/A 02/24/2021   Procedure: INCISION AND DRAINAGE PERINEUM;  Surgeon: Sheliah Hatch, De Blanch, MD;  Location: [facility];  Service: General;  Laterality: N/A;   INCISION AND DRAINAGE ABSCESS N/A 02/25/2021   Procedure: REPEAT WASHOUT OF PERINEUM;  Surgeon: Sheliah Hatch De Blanch, MD;  Location: [facility];  Service: General;  Laterality: N/A;   LAPAROSCOPIC DIVERTED COLOSTOMY N/A 02/25/2021   Procedure: LAPAROSCOPIC DIVERTING SIGMOID COLOSTOMY;  Surgeon: [name], MD;  Location: [facility];  Service: General;  Laterality: N/A;   LEFT HEART CATH AND CORONARY ANGIOGRAPHY N/A 05/18/2021   Procedure: LEFT HEART CATH AND CORONARY ANGIOGRAPHY;  Surgeon: Runell Gess, MD;  Location: [facility];  Service: Cardiovascular;  Laterality: N/A;    Family History family history includes CAD in his mother; Colon cancer in his father.  Social History Social History   Socioeconomic History   Marital status: Single    Spouse name: Not on file   Number of children: Not on file   Years of education: Not on file   Highest education level: Not on file  Occupational History   Not on file  Tobacco Use   Smoking status: Every Day    Current packs/day: 0.25    Types: Cigarettes   Smokeless tobacco: Never   Tobacco comments:    5 cigs per day/Uses Nicorette patch  Vaping Use   Vaping status: Never Used  Substance and Sexual Activity   Alcohol use: Never   Drug use: Never   Sexual activity: Not on file  Other Topics Concern   Not on file  Social History Narrative   Not on file   Social Determinants of Health   Financial Resource Strain: Low Risk  (02/16/2023)   Overall Financial Resource Strain (CARDIA)    Difficulty of Paying Living Expenses:  Not hard at all  Food Insecurity: No Food Insecurity (02/18/2023)   Hunger Vital Sign    Worried About Running Out of Food in the Last Year: Never true    Ran Out of Food in the Last Year: Never true  Transportation Needs: No Transportation Needs (02/18/2023)   PRAPARE - Administrator, Civil Service (Medical): No    Lack of Transportation (Non-Medical): No  Physical Activity: Insufficiently Active (02/16/2023)   Exercise Vital Sign    Days of Exercise per Week: 3 days    Minutes of Exercise per Session: 20 min  Stress: No Stress Concern Present (02/16/2023)   [employer] of Occupational Health - Occupational Stress Questionnaire    Feeling of Stress : Not at all  Social Connections: Moderately Isolated (02/16/2023)   Social Connection and Isolation Panel [NHANES]    Frequency of Communication with Friends and Family: More than three times a week    Frequency of Social Gatherings with Friends and Family: Three times a week    Attends Religious Services: 1 to 4 times per year    Active Member of Clubs or Organizations: No    Attends Banker Meetings: Never    Marital Status: Divorced  Catering manager Violence: Not At Risk (02/18/2023)   Humiliation, Afraid, Rape, and Kick questionnaire    Fear of  Current or Ex-Partner: No    Emotionally Abused: No    Physically Abused: No    Sexually Abused: No    Lab Results  Component Value Date   HGBA1C 10.3 (H) 03/23/2023   HGBA1C 9.6 (A) 02/16/2023   HGBA1C 10.2 (A) 12/06/2022   Lab Results  Component Value Date   CHOL 123 03/23/2023   Lab Results  Component Value Date   HDL 30.60 (L) 03/23/2023   Lab Results  Component Value Date   LDLCALC 78 02/16/2023   Lab Results  Component Value Date   TRIG 244.0 (H) 03/23/2023   Lab Results  Component Value Date   CHOLHDL 4 03/23/2023   Lab Results  Component Value Date   CREATININE 1.54 (H) 03/23/2023   Lab Results  Component Value Date   GFR 49.75 (L)  03/23/2023   No results found for: "MICROALBUR", "MALB24HUR"    Component Value Date/Time   NA 134 (L) 03/23/2023 1058   NA 139 03/16/2023 1636   K 4.0 03/23/2023 1058   CL 102 03/23/2023 1058   CO2 23 03/23/2023 1058   GLUCOSE 482 (H) 03/23/2023 1058   BUN 33 (H) 03/23/2023 1058   BUN 18 03/16/2023 1636   CREATININE 1.54 (H) 03/23/2023 1058   CALCIUM 8.8 03/23/2023 1058   PROT 6.9 03/23/2023 1058   PROT 6.1 01/19/2023 1632   ALBUMIN 3.3 (L) 03/23/2023 1058   ALBUMIN 3.4 (L) 01/19/2023 1632   AST 10 03/23/2023 1058   ALT 8 03/23/2023 1058   ALKPHOS 84 03/23/2023 1058   BILITOT 0.3 03/23/2023 1058   BILITOT <0.2 01/19/2023 1632   GFRNONAA 50 (L) 02/20/2023 0058   GFRAA >60 05/14/2019 0317      Latest Ref Rng & Units 03/23/2023   10:58 AM 03/16/2023    4:36 PM 02/20/2023   12:58 AM  BMP  Glucose 70 - 99 mg/dL 952  841  324   BUN 6 - 23 mg/dL 33  18  39   Creatinine 0.40 - 1.50 mg/dL 4.01  0.27  2.53   BUN/Creat Ratio 9 - 20  16    Sodium 135 - 145 mEq/L 134  139  134   Potassium 3.5 - 5.1 mEq/L 4.0  4.5  3.8   Chloride 96 - 112 mEq/L 102  105  104   CO2 19 - 32 mEq/L 23  18  22    Calcium 8.4 - 10.5 mg/dL 8.8  8.2  7.7        Component Value Date/Time   WBC 9.0 03/16/2023 1636   WBC 7.4 02/20/2023 0058   RBC 3.98 (L) 03/16/2023 1636   RBC 3.73 (L) 02/20/2023 0058   HGB 11.6 (L) 03/16/2023 1636   HCT 33.8 (L) 03/16/2023 1636   PLT 240 03/16/2023 1636   MCV 85 03/16/2023 1636   MCH 29.1 03/16/2023 1636   MCH 28.2 02/20/2023 0058   MCHC 34.3 03/16/2023 1636   MCHC 33.1 02/20/2023 0058   RDW 14.0 03/16/2023 1636   LYMPHSABS 1.8 02/18/2023 0404   MONOABS 0.4 02/18/2023 0404   EOSABS 0.3 02/18/2023 0404   BASOSABS 0.1 02/18/2023 0404     Parts of this note may have been dictated using voice recognition software. There may be variances in spelling and vocabulary which are unintentional. Not all errors are proofread. Please notify the Thereasa Parkin if any discrepancies are  noted or if the meaning of any statement is not clear.

## 2023-03-30 ENCOUNTER — Other Ambulatory Visit (HOSPITAL_COMMUNITY)
Admission: RE | Admit: 2023-03-30 | Discharge: 2023-03-30 | Disposition: A | Discharge status: Home / Self Care | Payer: Medicare Other | Source: Ambulatory Visit | Attending: Internal Medicine | Admitting: Internal Medicine

## 2023-03-30 ENCOUNTER — Ambulatory Visit
Admission: RE | Admit: 2023-03-30 | Discharge: 2023-03-30 | Disposition: A | Discharge status: Home / Self Care | Payer: Medicare Other | Source: Ambulatory Visit | Attending: Internal Medicine | Admitting: Internal Medicine

## 2023-03-30 DIAGNOSIS — E041 Nontoxic single thyroid nodule: Secondary | ICD-10-CM | POA: Insufficient documentation

## 2023-03-30 NOTE — Procedures (Signed)
Interventional Radiology Procedure Note  Procedure: US guided biopsy of right thyroid nodule Complications: None EBL: None Recommendations:   - Routine wound care - Follow up pathology    Signed,  Gilmer Mor, DO

## 2023-04-01 LAB — CYTOLOGY - NON PAP

## 2023-04-07 ENCOUNTER — Ambulatory Visit (INDEPENDENT_AMBULATORY_CARE_PROVIDER_SITE_OTHER): Payer: Medicare Other

## 2023-04-07 ENCOUNTER — Encounter: Payer: Self-pay | Admitting: Podiatry

## 2023-04-07 ENCOUNTER — Ambulatory Visit (INDEPENDENT_AMBULATORY_CARE_PROVIDER_SITE_OTHER): Payer: Medicare Other | Admitting: Podiatry

## 2023-04-07 DIAGNOSIS — L089 Local infection of the skin and subcutaneous tissue, unspecified: Secondary | ICD-10-CM | POA: Diagnosis not present

## 2023-04-07 DIAGNOSIS — E11628 Type 2 diabetes mellitus with other skin complications: Secondary | ICD-10-CM | POA: Diagnosis not present

## 2023-04-07 DIAGNOSIS — L97512 Non-pressure chronic ulcer of other part of right foot with fat layer exposed: Secondary | ICD-10-CM

## 2023-04-07 MED ORDER — DOXYCYCLINE HYCLATE 100 MG PO TABS: 100.0000 mg | ORAL_TABLET | Freq: Two times a day (BID) | ORAL | 0 refills | Status: DC

## 2023-04-07 NOTE — Progress Notes (Signed)
  Subjective:  Patient ID: Andres Richardson, male    DOB: [birth year],  MRN: [id number]  Chief Complaint  Patient presents with   Diabetes    RM3: Patient is here for a follow up for chronic conditions for [name] Last A1C:10.4 (03/23/2023)     57 y.o. male presents with the above complaint. History confirmed with patient.  He has not changed the bandage for a couple of days, has noted that it has been more painful than usual has had some drainage from it as well.  Objective:  Physical Exam: Weakly palpable pedal pulses foot is fairly warm and perfused well, plantar and medial ulcer now extensive the medial first MTPJ with necrotic subcutaneous tissue, malodor and serous drainage, has new deep tissue injury on the lateral fifth metatarsal.  Cellulitis to midfoot        Radiographs taken today of the right foot show no signs of erosion or osteomyelitis or soft tissue emphysema  Assessment:   1. Ulcer of right foot with fat layer exposed (HCC)   2. Type 2 diabetes mellitus with right diabetic foot infection (HCC)       Plan:  Patient was evaluated and treated and all questions answered.   Ulcer right foot -Unfortunately his right foot ulceration has significantly worsened, he has malodorous drainage there is no exposed bone tendon or joint but does have cellulitis to the midfoot.  I debrided the overlying tissue and recommended we do a a stat MRI that has been ordered.  I placed him as well on doxycycline to take twice daily.  We discussed signs symptoms of worsening infection if this develop such as fevers chills nausea vomiting none of which he currently has that he should present to the emergency room or if the cellulitis does not improve over the next 24 to 48 hours.   Return in about 3 weeks (around 04/28/2023) for wound care, after MRI to review.

## 2023-04-07 NOTE — Patient Instructions (Signed)
Call [facility] to schedule your MRI at the below locations.  Please allow at least 1 business day after your visit to process the referral.  It may take longer depending on approval from insurance.  Please let me know if you have issues or problems scheduling the MRI   [facility] [phone] [address] [place], [place] [address]  [facility] [phone] [address] [place], [place] [address]  

## 2023-04-13 ENCOUNTER — Other Ambulatory Visit: Payer: Self-pay | Admitting: Student

## 2023-04-13 ENCOUNTER — Telehealth: Payer: Self-pay | Admitting: Dietician

## 2023-04-13 NOTE — Telephone Encounter (Signed)
Called Andres Richardson to follow up with his diabetes self care to see if he needs help to implement endocrine's insulin orders. He states he did it for 3 days and felt bad so he went back to 45 units Lantus once daily  and 11u before eating 2 times a day and  2 metformin. He denies any hypoglycemia in the past month. Reports his sugars are better now and in the high 200s.  He states he is now Eating and feeling better, and is "so tired of everybody throwing more at him. Feels current regimen will work with time." He has no follow up appointments with Internal Medicine and has one with endocrine on 06-01-23.

## 2023-04-20 ENCOUNTER — Other Ambulatory Visit: Payer: Self-pay

## 2023-04-20 MED ORDER — ROSUVASTATIN CALCIUM 20 MG PO TABS: 20.0000 mg | ORAL_TABLET | Freq: Every day | ORAL | 3 refills | Status: DC

## 2023-04-22 ENCOUNTER — Ambulatory Visit: Payer: Medicare Other | Admitting: Vascular Surgery

## 2023-04-26 ENCOUNTER — Ambulatory Visit (INDEPENDENT_AMBULATORY_CARE_PROVIDER_SITE_OTHER): Payer: Medicare Other | Admitting: Podiatry

## 2023-04-26 ENCOUNTER — Encounter: Payer: Self-pay | Admitting: Podiatry

## 2023-04-26 ENCOUNTER — Other Ambulatory Visit: Payer: Self-pay

## 2023-04-26 DIAGNOSIS — L97512 Non-pressure chronic ulcer of other part of right foot with fat layer exposed: Secondary | ICD-10-CM

## 2023-04-26 DIAGNOSIS — I739 Peripheral vascular disease, unspecified: Secondary | ICD-10-CM

## 2023-04-26 DIAGNOSIS — L089 Local infection of the skin and subcutaneous tissue, unspecified: Secondary | ICD-10-CM

## 2023-04-26 DIAGNOSIS — L97516 Non-pressure chronic ulcer of other part of right foot with bone involvement without evidence of necrosis: Secondary | ICD-10-CM

## 2023-04-26 DIAGNOSIS — E11628 Type 2 diabetes mellitus with other skin complications: Secondary | ICD-10-CM

## 2023-04-26 MED ORDER — GENTAMICIN SULFATE 0.1 % EX OINT: 1.0000 | TOPICAL_OINTMENT | Freq: Every day | CUTANEOUS | 3 refills | Status: DC

## 2023-04-26 NOTE — Patient Instructions (Addendum)
Wash the foot every day with antibacterial (Dial) soap and water. Dry and apply the gentamicin ointment and a bandage  Call [facility] to schedule your MRI at the below locations.  Please allow at least 1 business day after your visit to process the referral.  It may take longer depending on approval from insurance.  Please let me know if you have issues or problems scheduling the MRI   [facility] [place] [phone] [address] [place] [address] [place], [place] [address]  [facility] [phone] [address] [place], [place] [address]

## 2023-04-27 MED ORDER — ISOSORBIDE MONONITRATE ER 30 MG PO TB24: 30.0000 mg | ORAL_TABLET | Freq: Every day | ORAL | 0 refills | Status: DC

## 2023-05-01 NOTE — Progress Notes (Signed)
Subjective:  Patient ID: Andres Richardson, male    DOB: [birth year],  MRN: [id number]  Chief Complaint  Patient presents with   Wound Check    RM2: Patient is here for wound care, ran out of medication, needs wash instructions for wound    57 y.o. male presents with the above complaint. History confirmed with patient.  He has completed the antibiotics, has not had ointment to change with.  He was not able to do the MRI yet  Objective:  Physical Exam: Weakly palpable pedal pulses foot is fairly warm and perfused well, plantar and medial ulcer now extensive the medial first MTPJ with necrotic subcutaneous tissue measuring 4.5 x 2.5 x 1.5 cm, less malodor, still serous drainage, right lateral fifth metatarsal has necrotic ulcer measuring 3.0 x 2.5 x 1.0 cm.  Serous drainage as well.  Erythema has improved.  Both wounds have exposed capsular tissue now         Radiographs taken today of the right foot show no signs of erosion or osteomyelitis or soft tissue emphysema  Assessment:   1. 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 Type 2 diabetes mellitus with right diabetic foot infection (HCC)   3. Peripheral vascular disease (HCC)       Plan:  Patient was evaluated and treated and all questions answered.   Ulcer right foot -We discussed the etiology and factors that are a part of the wound healing process.  We also discussed the risk of infection both soft tissue and osteomyelitis from open ulceration.  Discussed the risk of limb loss if this happens or worsens.  Suspect likely may end up with a transmetatarsal amputation pending results of MRI.  I recommended he complete this ASAP and he will call to reschedule -Debridement as below. -Dressed with Iodosorb, DSD. -Continue home dressing changes daily with gentamicin, refill sent to pharmacy  -Continue off-loading with surgical shoe. -Vascular testing, previously did not require angiography  based on his noninvasive, I will message vascular surgery to see if angiography is warranted at this point as he appears to be worsening -HgbA1c: Still severely uncontrolled at 10.3%   Procedure: Excisional Debridement of Wound Rationale: Removal of non-viable soft tissue from the wound to promote healing.  Anesthesia: none Post-Debridement Wound Measurements: Postdebridement measurements are noted above Type of Debridement: Sharp Excisional Tissue Removed: Non-viable soft tissue Depth of Debridement: Capsular tissue and fascia. Technique: Sharp excisional debridement to bleeding, viable wound base.  Dressing: Dry, sterile, compression dressing. Disposition: Patient tolerated procedure well.      Return in about 3 weeks (around 05/17/2023) for wound care.

## 2023-05-12 ENCOUNTER — Ambulatory Visit: Payer: Medicare Other | Attending: Internal Medicine | Admitting: Internal Medicine

## 2023-05-12 ENCOUNTER — Encounter: Payer: Self-pay | Admitting: Internal Medicine

## 2023-05-12 VITALS — BP 110/60 | HR 98 | Ht 71.0 in | Wt 197.4 lb

## 2023-05-12 DIAGNOSIS — I5033 Acute on chronic diastolic (congestive) heart failure: Secondary | ICD-10-CM | POA: Insufficient documentation

## 2023-05-12 DIAGNOSIS — Z794 Long term (current) use of insulin: Secondary | ICD-10-CM | POA: Insufficient documentation

## 2023-05-12 DIAGNOSIS — E119 Type 2 diabetes mellitus without complications: Secondary | ICD-10-CM | POA: Diagnosis present

## 2023-05-12 DIAGNOSIS — I10 Essential (primary) hypertension: Secondary | ICD-10-CM | POA: Diagnosis present

## 2023-05-12 DIAGNOSIS — I25119 Atherosclerotic heart disease of native coronary artery with unspecified angina pectoris: Secondary | ICD-10-CM | POA: Diagnosis present

## 2023-05-12 DIAGNOSIS — I739 Peripheral vascular disease, unspecified: Secondary | ICD-10-CM | POA: Insufficient documentation

## 2023-05-12 DIAGNOSIS — E7800 Pure hypercholesterolemia, unspecified: Secondary | ICD-10-CM | POA: Diagnosis not present

## 2023-05-12 DIAGNOSIS — R600 Localized edema: Secondary | ICD-10-CM | POA: Diagnosis present

## 2023-05-12 DIAGNOSIS — G4733 Obstructive sleep apnea (adult) (pediatric): Secondary | ICD-10-CM | POA: Diagnosis not present

## 2023-05-12 DIAGNOSIS — R809 Proteinuria, unspecified: Secondary | ICD-10-CM | POA: Diagnosis present

## 2023-05-12 MED ORDER — ROSUVASTATIN CALCIUM 40 MG PO TABS: 40.0000 mg | ORAL_TABLET | Freq: Every day | ORAL | 3 refills | Status: DC

## 2023-05-12 NOTE — Progress Notes (Signed)
Cardiology Office Note:  .   Date:  05/12/2023  ID:  Andres Richardson, DOB 05-15-1966, MRN [id number] PCP: Rudene Christians, DO  [facility] Providers Cardiologist:  Parke Poisson, MD    History of Present Illness: .   Andres Richardson is a 57 y.o. male.  Discussed the use of AI scribe software for clinical note transcription with the patient, who gave verbal consent to proceed.  History of Present Illness   The patient, with a history of diabetes, peripheral edema, proteinuria, and a chronic total occlusion of the RCA, presents for a follow-up visit. The patient was recently hospitalized for chest pain and pulmonary edema. The patient reports that he has been feeling better since the hospitalization, with no new episodes of chest pain or shortness of breath. However, he continues to struggle with diabetes management and has a foot wound that is not healing well.  The patient reports that he had an episode of coughing and shortness of breath in July, which led to a hospital admission. He was found to have pulmonary edema on a chest CT, but his troponins were only minimally elevated and flat, suggesting that he did not have a heart attack. He was treated with diuretics and felt better.  The patient also reports ongoing issues with a foot wound, which started as a small blister and has worsened. He is currently under the care of a wound specialist for this issue. He expresses frustration with his diabetes management, stating that he has been given different advice by different providers and has struggled to control his blood sugar levels. He reports that he has reduced his insulin dosage due to loss of appetite and feeling unwell, which has resulted in some improvement in his blood sugar levels.  The patient also mentions concerns about potential blood clots, although he has not had any symptoms suggestive of this. He reports that he has been trying to reduce his smoking and has made dietary  changes to reduce his salt intake.        ROS: negative except per HPI above.  Studies Reviewed: .        Results   LABS Hemoglobin A1c: 10.3% (03/23/2023) Troponin: Minimally elevated and flat (02/2023) BNP: 135 (02/2023) LDL: 78 Triglycerides: Elevated  RADIOLOGY Chest CT: Pulmonary edema (02/2023)  DIAGNOSTIC Echocardiogram: Low normal ejection fraction 50-55%, trivial pericardial effusion, normal RV function (02/2023) Cardiac catheterization: Chronic total occlusion of RCA     Risk Assessment/Calculations:             Physical Exam:   VS:  BP 110/60 (BP Location: Right Arm, Patient Position: Sitting, Cuff Size: Normal)   Pulse 98   Ht 5\' 11"  (1.803 m)   Wt 197 lb 6.4 oz (89.5 kg)   SpO2 98%   BMI 27.53 kg/m    Wt Readings from Last 3 Encounters:  05/12/23 197 lb 6.4 oz (89.5 kg)  03/16/23 218 lb (98.9 kg)  02/20/23 213 lb (96.6 kg)     Physical Exam   VITALS: BP- 110/60 CHEST: lungs clear to auscultation EXTREMITIES: left leg no edema, right leg 1+ edema     GEN: Well nourished, well developed in no acute distress NECK: No JVD; No carotid bruits CARDIAC: RRR, no murmurs, rubs, gallops RESPIRATORY:  Clear to auscultation without rales, wheezing or rhonchi  ABDOMEN: Soft, non-tender, non-distended EXTREMITIES:  1+ RLE edema; No deformity   ASSESSMENT AND PLAN: .    1. Essential hypertension  2. Acute on chronic diastolic heart failure (HCC)   3. Hypercholesterolemia   4. OSA (obstructive sleep apnea)   5. PAD (peripheral artery disease) (HCC)   6. Coronary artery disease involving native coronary artery of native heart with angina pectoris (HCC)   7. Type 2 diabetes mellitus without complication, with long-term current use of insulin (HCC)   8. Proteinuria, unspecified type   9. Peripheral edema     Assessment and Plan    Diabetic Foot Ulcer Worsening bilateral foot ulcers. Patient is currently under the care of Dr. Lilian Kapur for wound  management. Poorly controlled diabetes likely contributing to slow wound healing. -Continue current wound care with Dr. Lilian Kapur. -Encourage better glycemic control to aid in wound healing.  Poorly Controlled Diabetes Last HbA1c on March 23, 2023 was 10.3%. Patient reports inconsistent insulin dosing due to loss of appetite. Hyperlipidemia LDL slightly above goal at 78 mg/dL. Triglycerides elevated, likely secondary to poorly controlled diabetes. -Increase rosuvastatin to 40 mg daily. -Plan for lipid panel recheck in three months.  Chronic Heart Failure History of pulmonary edema and peripheral edema. Currently managed with Lasix 40 mg daily. Patient reports improvement in left leg swelling and stable breathing. -Continue current heart failure management. -Encourage daily weight monitoring and report weight gain of 3-5 pounds.  Coronary Artery Disease Chronic total occlusion of the RCA being managed medically. Patient reports occasional chest pain, possibly related to fluid overload or musculoskeletal pain. -Continue current medical management. -Advise patient to monitor for symptoms of fluid overload and to discuss musculoskeletal chest pain with primary care provider.  Peripheral Edema Improved left leg swelling, ongoing right leg swelling likely due to limited mobility from foot ulcer. -Continue Lasix 40 mg daily. -Encourage calf and thigh exercises to promote venous return.  Tobacco Use Patient reports reduction to 2-3 cigarettes per day. -Encourage continued smoking cessation efforts to aid in wound healing and overall health improvement.      HTN - continue amlodipine 5 mg daily, lasix 40 mg daily, metoprolol succ 25 mg daily      F/u 6 mo me or APP.     Signed, Parke Poisson, MD

## 2023-05-12 NOTE — Patient Instructions (Signed)
Medication Instructions:  Your physician has recommended you make the following change in your medication:   -Increase rosuvastatin (crestor) to 40mg  once daily.  *If you need a refill on your cardiac medications before your next appointment, please call your pharmacy*   Lab Work: Your physician recommends that you return for lab work in: 3 months for FASTING lipid/liver panel   If you have labs (blood work) drawn today and your tests are completely normal, you will receive your results only by: MyChart Message (if you have MyChart) OR A paper copy in the mail If you have any lab test that is abnormal or we need to change your treatment, we will call you to review the results.    Follow-Up: At [facility], you and your health needs are our priority.  As part of our continuing mission to provide you with exceptional heart care, we have created designated Provider Care Teams.  These Care Teams include your primary Cardiologist (physician) and Advanced Practice Providers (APPs -  Physician Assistants and Nurse Practitioners) who all work together to provide you with the care you need, when you need it.  We recommend signing up for the patient portal called "MyChart".  Sign up information is provided on this After Visit Summary.  MyChart is used to connect with patients for Virtual Visits (Telemedicine).  Patients are able to view lab/test results, encounter notes, upcoming appointments, etc.  Non-urgent messages can be sent to your provider as well.   To learn more about what you can do with MyChart, go to [email or web address].    Your next appointment:   6 month(s)  Provider:   Parke Poisson, MD  or Marjie Skiff, PA-C or Azalee Course, PA-C

## 2023-05-19 ENCOUNTER — Ambulatory Visit: Payer: Medicare Other | Admitting: Podiatry

## 2023-05-26 ENCOUNTER — Inpatient Hospital Stay (HOSPITAL_COMMUNITY)
Admission: EM | Admit: 2023-05-26 | Discharge: 2023-06-06 | DRG: 617 | Disposition: A | Discharge status: Home Health | Payer: Medicare Other | Attending: Internal Medicine | Admitting: Internal Medicine

## 2023-05-26 ENCOUNTER — Ambulatory Visit: Payer: Medicare Other | Admitting: Podiatry

## 2023-05-26 ENCOUNTER — Other Ambulatory Visit: Payer: Self-pay

## 2023-05-26 ENCOUNTER — Encounter (HOSPITAL_COMMUNITY): Payer: Self-pay

## 2023-05-26 ENCOUNTER — Emergency Department (HOSPITAL_COMMUNITY): Payer: Medicare Other

## 2023-05-26 ENCOUNTER — Inpatient Hospital Stay (HOSPITAL_COMMUNITY): Payer: Medicare Other

## 2023-05-26 DIAGNOSIS — L089 Local infection of the skin and subcutaneous tissue, unspecified: Secondary | ICD-10-CM | POA: Diagnosis not present

## 2023-05-26 DIAGNOSIS — E11621 Type 2 diabetes mellitus with foot ulcer: Secondary | ICD-10-CM | POA: Diagnosis present

## 2023-05-26 DIAGNOSIS — E11628 Type 2 diabetes mellitus with other skin complications: Secondary | ICD-10-CM | POA: Diagnosis not present

## 2023-05-26 DIAGNOSIS — I130 Hypertensive heart and chronic kidney disease with heart failure and stage 1 through stage 4 chronic kidney disease, or unspecified chronic kidney disease: Secondary | ICD-10-CM | POA: Diagnosis present

## 2023-05-26 DIAGNOSIS — D509 Iron deficiency anemia, unspecified: Secondary | ICD-10-CM | POA: Diagnosis present

## 2023-05-26 DIAGNOSIS — M869 Osteomyelitis, unspecified: Principal | ICD-10-CM | POA: Diagnosis present

## 2023-05-26 DIAGNOSIS — Z7984 Long term (current) use of oral hypoglycemic drugs: Secondary | ICD-10-CM

## 2023-05-26 DIAGNOSIS — I70221 Atherosclerosis of native arteries of extremities with rest pain, right leg: Secondary | ICD-10-CM | POA: Diagnosis present

## 2023-05-26 DIAGNOSIS — N1831 Chronic kidney disease, stage 3a: Secondary | ICD-10-CM | POA: Diagnosis present

## 2023-05-26 DIAGNOSIS — I5032 Chronic diastolic (congestive) heart failure: Secondary | ICD-10-CM | POA: Diagnosis present

## 2023-05-26 DIAGNOSIS — N179 Acute kidney failure, unspecified: Secondary | ICD-10-CM | POA: Diagnosis not present

## 2023-05-26 DIAGNOSIS — E785 Hyperlipidemia, unspecified: Secondary | ICD-10-CM | POA: Diagnosis present

## 2023-05-26 DIAGNOSIS — F064 Anxiety disorder due to known physiological condition: Secondary | ICD-10-CM | POA: Diagnosis not present

## 2023-05-26 DIAGNOSIS — M7989 Other specified soft tissue disorders: Secondary | ICD-10-CM | POA: Diagnosis not present

## 2023-05-26 DIAGNOSIS — R7982 Elevated C-reactive protein (CRP): Secondary | ICD-10-CM | POA: Diagnosis present

## 2023-05-26 DIAGNOSIS — E8809 Other disorders of plasma-protein metabolism, not elsewhere classified: Secondary | ICD-10-CM | POA: Diagnosis present

## 2023-05-26 DIAGNOSIS — R7989 Other specified abnormal findings of blood chemistry: Secondary | ICD-10-CM | POA: Diagnosis present

## 2023-05-26 DIAGNOSIS — Z7982 Long term (current) use of aspirin: Secondary | ICD-10-CM

## 2023-05-26 DIAGNOSIS — E1169 Type 2 diabetes mellitus with other specified complication: Principal | ICD-10-CM | POA: Diagnosis present

## 2023-05-26 DIAGNOSIS — Z794 Long term (current) use of insulin: Secondary | ICD-10-CM

## 2023-05-26 DIAGNOSIS — M868X7 Other osteomyelitis, ankle and foot: Secondary | ICD-10-CM | POA: Diagnosis present

## 2023-05-26 DIAGNOSIS — D649 Anemia, unspecified: Secondary | ICD-10-CM | POA: Diagnosis present

## 2023-05-26 DIAGNOSIS — E119 Type 2 diabetes mellitus without complications: Secondary | ICD-10-CM

## 2023-05-26 DIAGNOSIS — E1165 Type 2 diabetes mellitus with hyperglycemia: Secondary | ICD-10-CM | POA: Diagnosis present

## 2023-05-26 DIAGNOSIS — L97512 Non-pressure chronic ulcer of other part of right foot with fat layer exposed: Secondary | ICD-10-CM | POA: Diagnosis present

## 2023-05-26 DIAGNOSIS — E1122 Type 2 diabetes mellitus with diabetic chronic kidney disease: Secondary | ICD-10-CM | POA: Diagnosis present

## 2023-05-26 DIAGNOSIS — I739 Peripheral vascular disease, unspecified: Secondary | ICD-10-CM

## 2023-05-26 DIAGNOSIS — I700 Atherosclerosis of aorta: Secondary | ICD-10-CM | POA: Diagnosis present

## 2023-05-26 DIAGNOSIS — I745 Embolism and thrombosis of iliac artery: Secondary | ICD-10-CM | POA: Diagnosis present

## 2023-05-26 DIAGNOSIS — K219 Gastro-esophageal reflux disease without esophagitis: Secondary | ICD-10-CM | POA: Diagnosis present

## 2023-05-26 DIAGNOSIS — Z8249 Family history of ischemic heart disease and other diseases of the circulatory system: Secondary | ICD-10-CM

## 2023-05-26 DIAGNOSIS — Z800 Family history of malignant neoplasm of digestive organs: Secondary | ICD-10-CM

## 2023-05-26 DIAGNOSIS — Z7902 Long term (current) use of antithrombotics/antiplatelets: Secondary | ICD-10-CM

## 2023-05-26 DIAGNOSIS — Z933 Colostomy status: Secondary | ICD-10-CM

## 2023-05-26 DIAGNOSIS — L97509 Non-pressure chronic ulcer of other part of unspecified foot with unspecified severity: Secondary | ICD-10-CM | POA: Diagnosis not present

## 2023-05-26 DIAGNOSIS — Z79899 Other long term (current) drug therapy: Secondary | ICD-10-CM

## 2023-05-26 DIAGNOSIS — I10 Essential (primary) hypertension: Secondary | ICD-10-CM | POA: Diagnosis present

## 2023-05-26 DIAGNOSIS — I70235 Atherosclerosis of native arteries of right leg with ulceration of other part of foot: Secondary | ICD-10-CM | POA: Diagnosis not present

## 2023-05-26 DIAGNOSIS — D62 Acute posthemorrhagic anemia: Secondary | ICD-10-CM | POA: Diagnosis not present

## 2023-05-26 DIAGNOSIS — F17210 Nicotine dependence, cigarettes, uncomplicated: Secondary | ICD-10-CM | POA: Diagnosis present

## 2023-05-26 DIAGNOSIS — L97516 Non-pressure chronic ulcer of other part of right foot with bone involvement without evidence of necrosis: Secondary | ICD-10-CM | POA: Diagnosis not present

## 2023-05-26 DIAGNOSIS — Z91048 Other nonmedicinal substance allergy status: Secondary | ICD-10-CM

## 2023-05-26 DIAGNOSIS — I2510 Atherosclerotic heart disease of native coronary artery without angina pectoris: Secondary | ICD-10-CM | POA: Diagnosis present

## 2023-05-26 DIAGNOSIS — E1140 Type 2 diabetes mellitus with diabetic neuropathy, unspecified: Secondary | ICD-10-CM | POA: Diagnosis present

## 2023-05-26 DIAGNOSIS — M86171 Other acute osteomyelitis, right ankle and foot: Secondary | ICD-10-CM | POA: Diagnosis not present

## 2023-05-26 LAB — COMPREHENSIVE METABOLIC PANEL
ALT: 7 U/L (ref 0–44)
AST: 7 U/L — ABNORMAL LOW (ref 15–41)
Albumin: 2 g/dL — ABNORMAL LOW (ref 3.5–5.0)
Alkaline Phosphatase: 81 U/L (ref 38–126)
Anion gap: 10 (ref 5–15)
BUN: 17 mg/dL (ref 6–20)
CO2: 25 mmol/L (ref 22–32)
Calcium: 7.7 mg/dL — ABNORMAL LOW (ref 8.9–10.3)
Chloride: 98 mmol/L (ref 98–111)
Creatinine, Ser: 1.43 mg/dL — ABNORMAL HIGH (ref 0.61–1.24)
GFR, Estimated: 57 mL/min — ABNORMAL LOW (ref >60)
Glucose, Bld: 452 mg/dL — ABNORMAL HIGH (ref 70–99)
Potassium: 3.4 mmol/L — ABNORMAL LOW (ref 3.5–5.1)
Sodium: 133 mmol/L — ABNORMAL LOW (ref 135–145)
Total Bilirubin: 0.6 mg/dL (ref 0.3–1.2)
Total Protein: 6.3 g/dL — ABNORMAL LOW (ref 6.5–8.1)

## 2023-05-26 LAB — CBC WITH DIFFERENTIAL/PLATELET
Abs Immature Granulocytes: 0.04 10*3/uL (ref 0.00–0.07)
Basophils Absolute: 0.1 10*3/uL (ref 0.0–0.1)
Basophils Relative: 1 %
Eosinophils Absolute: 0.3 10*3/uL (ref 0.0–0.5)
Eosinophils Relative: 3 %
HCT: 23.7 % — ABNORMAL LOW (ref 39.0–52.0)
Hemoglobin: 7.8 g/dL — ABNORMAL LOW (ref 13.0–17.0)
Immature Granulocytes: 1 %
Lymphocytes Relative: 18 %
Lymphs Abs: 1.5 10*3/uL (ref 0.7–4.0)
MCH: 27.3 pg (ref 26.0–34.0)
MCHC: 32.9 g/dL (ref 30.0–36.0)
MCV: 82.9 fL (ref 80.0–100.0)
Monocytes Absolute: 0.6 10*3/uL (ref 0.1–1.0)
Monocytes Relative: 7 %
Neutro Abs: 5.8 10*3/uL (ref 1.7–7.7)
Neutrophils Relative %: 70 %
Platelets: 268 10*3/uL (ref 150–400)
RBC: 2.86 MIL/uL — ABNORMAL LOW (ref 4.22–5.81)
RDW: 14.6 % (ref 11.5–15.5)
WBC: 8.2 10*3/uL (ref 4.0–10.5)
nRBC: 0 % (ref 0.0–0.2)

## 2023-05-26 LAB — I-STAT CG4 LACTIC ACID, ED: Lactic Acid, Venous: 0.9 mmol/L (ref 0.5–1.9)

## 2023-05-26 MED ORDER — IOHEXOL 350 MG/ML SOLN
100.0000 mL | Freq: Once | INTRAVENOUS | Status: AC | PRN
  Administered 2023-05-27: 100 mL via INTRAVENOUS

## 2023-05-26 MED ORDER — ONDANSETRON 4 MG PO TBDP
4.0000 mg | ORAL_TABLET | Freq: Once | ORAL | Status: AC
  Administered 2023-05-26: 4 mg via ORAL
  Filled 2023-05-26: qty 1

## 2023-05-26 MED ORDER — AMLODIPINE BESYLATE 5 MG PO TABS
5.0000 mg | ORAL_TABLET | Freq: Every day | ORAL | Status: DC
  Administered 2023-05-27 – 2023-06-06 (×11): 5 mg via ORAL
  Filled 2023-05-26 (×11): qty 1

## 2023-05-26 MED ORDER — CIPROFLOXACIN HCL 500 MG PO TABS
750.0000 mg | ORAL_TABLET | Freq: Two times a day (BID) | ORAL | Status: DC
  Administered 2023-05-26: 750 mg via ORAL
  Filled 2023-05-26 (×2): qty 2

## 2023-05-26 MED ORDER — INSULIN ASPART 100 UNIT/ML IJ SOLN
0.0000 [IU] | Freq: Three times a day (TID) | INTRAMUSCULAR | Status: DC
  Administered 2023-05-27: 3 [IU] via SUBCUTANEOUS
  Administered 2023-05-27: 5 [IU] via SUBCUTANEOUS

## 2023-05-26 MED ORDER — OXYCODONE HCL 5 MG PO TABS
5.0000 mg | ORAL_TABLET | ORAL | Status: DC | PRN
  Administered 2023-05-28 – 2023-06-06 (×16): 5 mg via ORAL
  Filled 2023-05-26 (×16): qty 1

## 2023-05-26 MED ORDER — OXYCODONE-ACETAMINOPHEN 5-325 MG PO TABS
2.0000 | ORAL_TABLET | Freq: Once | ORAL | Status: AC
  Administered 2023-05-26: 2 via ORAL
  Filled 2023-05-26: qty 2

## 2023-05-26 MED ORDER — ACETAMINOPHEN 500 MG PO TABS: 1000.0000 mg | ORAL_TABLET | Freq: Four times a day (QID) | ORAL | Status: DC | PRN

## 2023-05-26 MED ORDER — GABAPENTIN 100 MG PO CAPS
200.0000 mg | ORAL_CAPSULE | Freq: Three times a day (TID) | ORAL | Status: DC
  Administered 2023-05-27 – 2023-06-06 (×30): 200 mg via ORAL
  Filled 2023-05-26 (×30): qty 2

## 2023-05-26 MED ORDER — DULOXETINE HCL 60 MG PO CPEP
60.0000 mg | ORAL_CAPSULE | Freq: Every day | ORAL | Status: DC
  Administered 2023-05-27 – 2023-06-06 (×11): 60 mg via ORAL
  Filled 2023-05-26 (×11): qty 1

## 2023-05-26 MED ORDER — AMOXICILLIN-POT CLAVULANATE 875-125 MG PO TABS
1.0000 | ORAL_TABLET | Freq: Two times a day (BID) | ORAL | Status: DC
  Administered 2023-05-26: 1 via ORAL
  Filled 2023-05-26 (×2): qty 1

## 2023-05-26 MED ORDER — INSULIN ASPART 100 UNIT/ML IJ SOLN
10.0000 [IU] | Freq: Three times a day (TID) | INTRAMUSCULAR | Status: DC
  Administered 2023-05-27: 10 [IU] via SUBCUTANEOUS

## 2023-05-26 MED ORDER — POLYETHYLENE GLYCOL 3350 17 G PO PACK: 17.0000 g | PACK | Freq: Every day | ORAL | Status: DC | PRN

## 2023-05-26 MED ORDER — DOCUSATE SODIUM 100 MG PO CAPS
100.0000 mg | ORAL_CAPSULE | Freq: Two times a day (BID) | ORAL | Status: DC
  Administered 2023-05-27 – 2023-06-01 (×4): 100 mg via ORAL
  Filled 2023-05-26 (×14): qty 1

## 2023-05-26 MED ORDER — DOXYCYCLINE HYCLATE 100 MG PO TABS
100.0000 mg | ORAL_TABLET | Freq: Two times a day (BID) | ORAL | Status: DC
  Administered 2023-05-26: 100 mg via ORAL
  Filled 2023-05-26 (×2): qty 1

## 2023-05-26 MED ORDER — PANTOPRAZOLE SODIUM 40 MG PO TBEC
40.0000 mg | DELAYED_RELEASE_TABLET | Freq: Every day | ORAL | Status: DC
  Administered 2023-05-27 – 2023-06-06 (×11): 40 mg via ORAL
  Filled 2023-05-26 (×11): qty 1

## 2023-05-26 MED ORDER — POTASSIUM CHLORIDE 20 MEQ PO PACK
40.0000 meq | PACK | Freq: Once | ORAL | Status: AC
  Administered 2023-05-26: 40 meq via ORAL
  Filled 2023-05-26: qty 2

## 2023-05-26 MED ORDER — INSULIN GLARGINE-YFGN 100 UNIT/ML ~~LOC~~ SOLN
40.0000 [IU] | Freq: Every day | SUBCUTANEOUS | Status: DC
  Administered 2023-05-27 – 2023-05-28 (×2): 40 [IU] via SUBCUTANEOUS
  Filled 2023-05-26 (×4): qty 0.4

## 2023-05-26 MED ORDER — CALCIUM CARBONATE ANTACID 500 MG PO CHEW
1.0000 | CHEWABLE_TABLET | Freq: Every day | ORAL | Status: DC
  Administered 2023-05-27 – 2023-06-05 (×10): 200 mg via ORAL
  Filled 2023-05-26 (×10): qty 1

## 2023-05-26 MED ORDER — ROSUVASTATIN CALCIUM 20 MG PO TABS
40.0000 mg | ORAL_TABLET | Freq: Every day | ORAL | Status: DC
  Administered 2023-05-27 – 2023-06-06 (×11): 40 mg via ORAL
  Filled 2023-05-26 (×11): qty 2

## 2023-05-26 MED ORDER — ISOSORBIDE MONONITRATE ER 30 MG PO TB24
30.0000 mg | ORAL_TABLET | Freq: Every day | ORAL | Status: DC
  Administered 2023-05-27 – 2023-06-06 (×11): 30 mg via ORAL
  Filled 2023-05-26 (×11): qty 1

## 2023-05-26 MED ORDER — METFORMIN HCL 500 MG PO TABS: 1000.0000 mg | ORAL_TABLET | Freq: Two times a day (BID) | ORAL | Status: DC

## 2023-05-26 NOTE — ED Notes (Signed)
ED TO INPATIENT HANDOFF REPORT  ED Nurse Name and Phone #: [phone]  S Name/Age/Gender Andres Richardson 57 y.o. male Room/Bed: 003C/003C  Code Status   Code Status: Full Code  Home/SNF/Other Home Patient oriented to: self, place, time, and situation Is this baseline? Yes   Triage Complete: Triage complete  Chief Complaint Osteomyelitis of right lower extremity (HCC) [M86.9]  Triage Note Reports diabetic ulcer on bilateral sides of right foot.  Reports has not been able to get his MRI so his podiatrist sent him here for further evaluation for osteomyletitis.  Patients right leg is swollen significantly compared to left leg.  No redness noted on right leg.    Allergies Allergies  Allergen Reactions   Adhesive [Tape] Rash    Level of Care/Admitting Diagnosis ED Disposition     ED Disposition  Admit   Condition  --   Comment  Hospital Area: [facility] [[id number]]  Level of Care: Med-Surg [16]  May admit patient to [facility] or [facility] if equivalent level of care is available:: No  Covid Evaluation: Asymptomatic - no recent exposure (last 10 days) testing not required  Diagnosis: Osteomyelitis of right lower extremity [facility]) [[id number]]  Admitting Physician: Dickie La [[id number]]  Attending Physician: Dickie La [[id number]]  Certification:: I certify this patient will need inpatient services for at least 2 midnights  Expected Medical Readiness: 05/30/2023          B Medical/Surgery History Past Medical History:  Diagnosis Date   CAD (coronary artery disease) 2022     Prox RCA lesion is 100% stenosed.   Prox Cx to Mid Cx lesion is 60% stenosed.   LPAV lesion is 60% stenosed.   Ost LAD to Prox LAD lesion is 40% stenosed.   Diabetes mellitus without complication (HCC)    Type !! 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 Diabetic neuropathy.   Fournier gangrene 2022   Grade I diastolic dysfunction 12/02/2021   Grade I diastolic dysfunction  12/02/2021   History of creation of ostomy [facility])    Hyperlipidemia    Mixed   Hypertension    Essential   Obesity    Peripheral arterial disease (HCC)    occluded left common iliac artery   Proteinuria    Tobacco use disorder    Past Surgical History:  Procedure Laterality Date   BIOPSY  12/04/2021   Procedure: BIOPSY;  Surgeon: Kerin Salen, MD;  Location: [facility] ENDOSCOPY;  Service: Gastroenterology;;   ESOPHAGOGASTRODUODENOSCOPY (EGD) WITH PROPOFOL N/A 12/04/2021   Procedure: ESOPHAGOGASTRODUODENOSCOPY (EGD) WITH PROPOFOL;  Surgeon: Kerin Salen, MD;  Location: [facility] ENDOSCOPY;  Service: Gastroenterology;  Laterality: N/A;   INCISION AND DRAINAGE ABSCESS N/A 02/24/2021   Procedure: INCISION AND DRAINAGE PERINEUM;  Surgeon: Sheliah Hatch, De Blanch, MD;  Location: [facility] ORS;  Service: General;  Laterality: N/A;   INCISION AND DRAINAGE ABSCESS N/A 02/25/2021   Procedure: REPEAT WASHOUT OF PERINEUM;  Surgeon: Sheliah Hatch De Blanch, MD;  Location: [facility] ORS;  Service: General;  Laterality: N/A;   LAPAROSCOPIC DIVERTED COLOSTOMY N/A 02/25/2021   Procedure: LAPAROSCOPIC DIVERTING SIGMOID COLOSTOMY;  Surgeon: [name], MD;  Location: [facility] ORS;  Service: General;  Laterality: N/A;   LEFT HEART CATH AND CORONARY ANGIOGRAPHY N/A 05/18/2021   Procedure: LEFT HEART CATH AND CORONARY ANGIOGRAPHY;  Surgeon: Runell Gess, MD;  Location: [facility] INVASIVE CV LAB;  Service: Cardiovascular;  Laterality: N/A;     A IV Location/Drains/Wounds Patient Lines/Drains/Airways Status     Active Line/Drains/Airways  Name Placement date Placement time Site Days   Peripheral IV 05/26/23 20 G 1" Left Hand 05/26/23  2027  Hand  less than 1   Peripheral IV 05/26/23 20 G 2.5" Left;Upper Arm 05/26/23  2157  Arm  less than 1   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  0000  LLQ  821   Wound / Incision (Open or Dehisced) 02/18/23 Diabetic ulcer Foot Right;Posterior small ucler by great posterior toe 02/18/23  1245  Foot  97             Intake/Output Last 24 hours No intake or output data in the 24 hours ending 05/26/23 2305  Labs/Imaging Results for orders placed or performed during the hospital encounter of 05/26/23 (from the past 48 hour(s))  Comprehensive metabolic panel     Status: Abnormal   Collection Time: 05/26/23  8:25 PM  Result Value Ref Range   Sodium 133 (L) 135 - 145 mmol/L   Potassium 3.4 (L) 3.5 - 5.1 mmol/L   Chloride 98 98 - 111 mmol/L   CO2 25 22 - 32 mmol/L   Glucose, Bld 452 (H) 70 - 99 mg/dL    Comment: Glucose reference range applies only to samples taken after fasting for at least 8 hours.   BUN 17 6 - 20 mg/dL   Creatinine, Ser 1.61 (H) 0.61 - 1.24 mg/dL   Calcium 7.7 (L) 8.9 - 10.3 mg/dL   Total Protein 6.3 (L) 6.5 - 8.1 g/dL   Albumin 2.0 (L) 3.5 - 5.0 g/dL   AST 7 (L) 15 - 41 U/L   ALT 7 0 - 44 U/L   Alkaline Phosphatase 81 38 - 126 U/L   Total Bilirubin 0.6 0.3 - 1.2 mg/dL   GFR, Estimated 57 (L) >60 mL/min    Comment: (NOTE) Calculated using the CKD-EPI Creatinine Equation (2021)    Anion gap 10 5 - 15    Comment: Performed at [facility], [address] [place], [place] [address]  CBC with Differential     Status: Abnormal   Collection Time: 05/26/23  8:25 PM  Result Value Ref Range   WBC 8.2 4.0 - 10.5 K/uL   RBC 2.86 (L) 4.22 - 5.81 MIL/uL   Hemoglobin 7.8 (L) 13.0 - 17.0 g/dL   HCT 54.0 (L) 98.1 - 19.1 %   MCV 82.9 80.0 - 100.0 fL   MCH 27.3 26.0 - 34.0 pg   MCHC 32.9 30.0 - 36.0 g/dL   RDW 47.8 29.5 - 62.1 %   Platelets 268 150 - 400 K/uL   nRBC 0.0 0.0 - 0.2 %   Neutrophils Relative % 70 %   Neutro Abs 5.8 1.7 - 7.7 K/uL   Lymphocytes Relative 18 %   Lymphs Abs 1.5 0.7 - 4.0 K/uL   Monocytes Relative 7 %   Monocytes Absolute 0.6 0.1 - 1.0 K/uL   Eosinophils Relative 3 %   Eosinophils Absolute 0.3 0.0 - 0.5 K/uL   Basophils Relative 1 %   Basophils Absolute 0.1 0.0 - 0.1 K/uL   Immature Granulocytes 1 %   Abs Immature Granulocytes 0.04 0.00 - 0.07  K/uL    Comment: Performed at [facility], [address], [place], [place] [address]  I-Stat Lactic Acid, ED     Status: None   Collection Time: 05/26/23  8:36 PM  Result Value Ref Range   Lactic Acid, Venous 0.9 0.5 - 1.9 mmol/L   MRI Right foot without contrast  Result Date: 05/26/2023 CLINICAL DATA:  Bony lesion in the foot EXAM: MRI OF THE RIGHT FOREFOOT WITHOUT CONTRAST TECHNIQUE: Multiplanar, multisequence MR imaging of the right forefoot was performed. No intravenous contrast was administered. COMPARISON:  Radiographs 04/07/2023 FINDINGS: Bones/Joint/Cartilage Osteomyelitis of the fifth metatarsal and phalanges of the fifth toe with bony destructive findings in the head and distal metaphysis of the fifth metatarsal and proximally in the fifth toe. Overlying ulceration noted with gas tracking over adjacent to the head of the fifth metatarsal and fifth MTP joint. There may be some slight reactive endosteal edema the fourth metatarsal and fourth toe proximal phalanx but without compelling evidence of fourth digit osteomyelitis at this time. Edema medially in the head of the first metatarsal probably from arthropathy such as gout. Bifid medial sesamoid of the first digit. Ligaments The Lisfranc ligament is intact. Muscles and Tendons Diffuse low-grade atrophy and edema in the regional musculature is likely neurogenic. No intramuscular abscess. Soft tissues As noted above there is a large ulceration overlying the distal fifth metatarsal and fifth MTP joint with abnormal subcutaneous tissues in this vicinity and some speckled gas densities in the soft tissues extending towards the regions of fifth digit bony destruction. Moderate dorsal subcutaneous edema in the forefoot, cellulitis not excluded. IMPRESSION: 1. Osteomyelitis of the fifth metatarsal and phalanges of the fifth toe with bony destructive findings in the head and distal metaphysis of the fifth metatarsal and proximally in the fifth  toe. Overlying ulceration with gas tracking over adjacent to the head of the fifth metatarsal and fifth MTP joint. 2. There may be some slight reactive endosteal edema in the fourth metatarsal and fourth toe proximal phalanx but without compelling evidence of fourth digit osteomyelitis at this time. 3. Edema medially in the head of the first metatarsal probably from arthropathy such as gout. 4. Diffuse low-grade atrophy and edema in the regional musculature is likely neurogenic. 5. Moderate dorsal subcutaneous edema in the forefoot, cellulitis not excluded. Electronically Signed   By: Gaylyn Rong M.D.   On: 05/26/2023 20:56    Pending Labs Unresulted Labs (From admission, onward)     Start     Ordered   05/27/23 0500  C-reactive protein  Tomorrow morning,   R        05/26/23 2243   05/27/23 0500  HIV Antibody (routine testing w rflx)  (HIV Antibody (Routine testing w reflex) panel)  Tomorrow morning,   R        05/26/23 2243   05/27/23 0500  Sedimentation rate  Tomorrow morning,   R        05/26/23 2243   05/27/23 0500  Iron and TIBC  Tomorrow morning,   R        05/26/23 2244   05/27/23 0500  Ferritin  Tomorrow morning,   R        05/26/23 2244   05/26/23 2244  Urinalysis, Routine w reflex microscopic -Urine, Clean Catch  Once,   R       Question:  Specimen Source  Answer:  Urine, Clean Catch   05/26/23 2243   05/26/23 1649  Blood Cultures x 2 sites  BLOOD CULTURE X 2,   STAT      05/26/23 1650            Vitals/Pain Today's Vitals   05/26/23 2115 05/26/23 2130 05/26/23 2145 05/26/23 2200  BP: 133/63 (!) 142/70 113/66 123/69  Pulse: 76 77 78 77  Resp: 11 14 12  15  Temp:      TempSrc:      SpO2: 97% 93% 97% 99%  Weight:      Height:      PainSc:        Isolation Precautions No active isolations  Medications Medications  acetaminophen (TYLENOL) tablet 1,000 mg (has no administration in time range)  oxyCODONE (Oxy IR/ROXICODONE) immediate release tablet 5 mg (has  no administration in time range)  docusate sodium (COLACE) capsule 100 mg (has no administration in time range)  polyethylene glycol (MIRALAX / GLYCOLAX) packet 17 g (has no administration in time range)  amoxicillin-clavulanate (AUGMENTIN) 875-125 MG per tablet 1 tablet (has no administration in time range)  ciprofloxacin (CIPRO) tablet 750 mg (has no administration in time range)  doxycycline (VIBRA-TABS) tablet 100 mg (has no administration in time range)  potassium chloride (KLOR-CON) packet 40 mEq (has no administration in time range)  insulin glargine-yfgn (SEMGLEE) injection 40 Units (has no administration in time range)  insulin aspart (novoLOG) injection 0-15 Units (has no administration in time range)  insulin aspart (novoLOG) injection 10 Units (has no administration in time range)  amLODipine (NORVASC) tablet 5 mg (has no administration in time range)  calcium carbonate (TUMS - dosed in mg elemental calcium) chewable tablet 200 mg of elemental calcium (has no administration in time range)  DULoxetine (CYMBALTA) DR capsule 60 mg (has no administration in time range)  gabapentin (NEURONTIN) capsule 200 mg (has no administration in time range)  metFORMIN (GLUCOPHAGE) tablet 1,000 mg (has no administration in time range)  pantoprazole (PROTONIX) EC tablet 40 mg (has no administration in time range)  isosorbide mononitrate (IMDUR) 24 hr tablet 30 mg (has no administration in time range)  rosuvastatin (CRESTOR) tablet 40 mg (has no administration in time range)  oxyCODONE-acetaminophen (PERCOCET/ROXICET) 5-325 MG per tablet 2 tablet (2 tablets Oral Given 05/26/23 1808)  ondansetron (ZOFRAN-ODT) disintegrating tablet 4 mg (4 mg Oral Given 05/26/23 1808)    Mobility walks with device     Focused Assessments     R Recommendations: See Admitting Provider Note  Report given to:   Additional Notes:  Patient is A&O X4, uses cane for ambulation
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
## 2023-05-26 NOTE — ED Notes (Signed)
Patient to MRI.

## 2023-05-26 NOTE — H&P (Addendum)
Date: 05/27/2023               Patient Name:  Andres Richardson MRN: [id number]  DOB: Oct 06, 1965 Age / Sex: 57 y.o., male   PCP: Rudene Christians, DO         Medical Service: Internal Medicine Teaching Service         Attending Physician: Dr. Dickie La, MD      First Contact: Dr. Monna Fam, MD Pager [phone]    Second Contact: Dr. Marrianne Mood, MD Pager [phone]         After Hours (After 5p/  First Contact Pager: [phone]  weekends / holidays): Second Contact Pager: [phone]   SUBJECTIVE   Chief Complaint: Right foot ulcers  History of Present Illness:  Patient is a 57 year old man with past medical history significant for HFpEF, HTN, T2DM, HLD, CAD, PAD, s/p colostomy presenting to the ED with right foot ulcers. Patient reports noticing a small wound on the medial aspect of his right foot sometime last year. The wound continued to enlarge, and another wound developed on the lateral aspect of his right foot. He is followed by podiatry for these diabetic foot ulcers, and last month completed a course of antibiotics for his ulcers. Approximately six weeks ago he felt his wounds had started to become infected. He states the ulcers started to "open up" with increasing pain and swelling of his foot and leg, and some purulent and bloody drainage. He has had difficulty walking due to pain, though is still able to walk with a cane. Earlier today he had an appointment with his podiatrist, who sent him to the ED with concern for osteomyelitis and to have an MRI performed. Patient denies fever, nausea, vomiting, or other systemic symptoms. He has noted some increased diarrhea in his colostomy bag over the last several weeks.   ED Course: Patient was HDS, A and O x4, overall well-appearing WBC 8.2, Hgb 7.8, Cr 1.43 Glucose 452, Anion gap 10, Lactic acid 0.9  MRI foot w/o showing osteomyelitis of the fifth metatarsal and phalanges of the fifth toe with bony destructive findings in the head and  distal metaphysis of the fifth metatarsal and proximally in the fifth toe.   Vascular surgery consulted, recommend CTA of abdomen and pelvis. Anticipate need for extra-anatomic bypass  Past Medical History: HTN T2DM HLD c/b CAD, PAD HFpEF S/p colostomy after bacterial infection   Meds:  Olmesartan 20mg  Amlodipine 5mg  Duloxetine 60mg  Furosemide 40mg  Gabapentin 100mg  Lantus 25 units BID Humalog 11 units TID Imdur 30mg  Metformin 1000mg  BID Metoprolol 25mg  Singulair 10mg  Protonix 40mg  Crestor 40mg  Zoloft 25mg   Past Surgical History:  Procedure Laterality Date   BIOPSY  12/04/2021   Procedure: BIOPSY;  Surgeon: Kerin Salen, MD;  [facility];  Service: Gastroenterology;;   ESOPHAGOGASTRODUODENOSCOPY (EGD) WITH PROPOFOL N/A 12/04/2021   Procedure: ESOPHAGOGASTRODUODENOSCOPY (EGD) WITH PROPOFOL;  Surgeon: Kerin Salen, MD;  [facility];  Service: Gastroenterology;  Laterality: N/A;   INCISION AND DRAINAGE ABSCESS N/A 02/24/2021   Procedure: INCISION AND DRAINAGE PERINEUM;  Surgeon: Sheliah Hatch De Blanch, MD;  Location: [facility];  Service: General;  Laterality: N/A;   INCISION AND DRAINAGE ABSCESS N/A 02/25/2021   Procedure: REPEAT WASHOUT OF PERINEUM;  Surgeon: Sheliah Hatch De Blanch, MD;  Location[facility];  Service: General;  Laterality: N/A;   LAPAROSCOPIC DIVERTED COLOSTOMY N/A 02/25/2021   Procedure: LAPAROSCOPIC DIVERTING SIGMOID COLOSTOMY;  Surgeon: [name], MD;  Location[facility];  Service: General;  Laterality: N/A;   LEFT HEART CATH AND CORONARY ANGIOGRAPHY N/A 05/18/2021   Procedure: LEFT HEART CATH AND CORONARY ANGIOGRAPHY;  Surgeon: Runell Gess, MD;  [facility];  Service: Cardiovascular;  Laterality: N/A;    Social:  Living Situation: lives at home with friend Occupation: former truck Theme park manager of Function: independent in ADLs/iADLs PCP: Dr Florentina Addison [name] Substances: no alcohol, no drugs, 3-4 cigarettes/day,  formerly 2.5 packs/day   Family History: noncontributory  Allergies: Allergies as of 05/26/2023 - Review Complete 05/26/2023  Allergen Reaction Noted   Adhesive [tape] Rash 03/30/2017    Review of Systems: A complete ROS was negative except as per HPI.   OBJECTIVE:   Physical Exam: Blood pressure 123/69, pulse 77, temperature 97.9 F (36.6 C), temperature source Oral, resp. rate 15, height 5\' 11"  (1.803 m), weight 89.4 kg, SpO2 99%.  Constitutional: well-appearing, in no acute distress HENT: normocephalic atraumatic, mucous membranes moist Eyes: conjunctiva non-erythematous Neck: supple Cardiovascular: regular rate and rhythm, no m/r/g. Weakly palpable pedal pulses, foot is warm and well-perfused Pulmonary/Chest: normal work of breathing on room air, lungs clear to auscultation bilaterally Abdominal: soft, non-tender, colostomy in place with stool output MSK: normal bulk and tone Neurological: alert & oriented x 3, 5/5 strength in bilateral upper and lower extremities, normal gait Skin: Plantar medial ulcer of first MTP joint with some necrotic subcutaneous tissue, approximately 4.5 x 2.5 x 1.5 cm, some clear drainage. Right lateral fifth metatarsal with necrotic ulcer approximately 3.0 x 2.5 x 1.0 cm with serious drainage  Labs: CBC    Component Value Date/Time   WBC 8.2 05/26/2023 2025   RBC 2.86 (L) 05/26/2023 2025   HGB 7.8 (L) 05/26/2023 2025   HGB 11.6 (L) 03/16/2023 1636   HCT 23.7 (L) 05/26/2023 2025   HCT 33.8 (L) 03/16/2023 1636   PLT 268 05/26/2023 2025   PLT 240 03/16/2023 1636   MCV 82.9 05/26/2023 2025   MCV 85 03/16/2023 1636   MCH 27.3 05/26/2023 2025   MCHC 32.9 05/26/2023 2025   RDW 14.6 05/26/2023 2025   RDW 14.0 03/16/2023 1636   LYMPHSABS 1.5 05/26/2023 2025   MONOABS 0.6 05/26/2023 2025   EOSABS 0.3 05/26/2023 2025   BASOSABS 0.1 05/26/2023 2025     CMP     Component Value Date/Time   NA 133 (L) 05/26/2023 2025   NA 139 03/16/2023 1636    K 3.4 (L) 05/26/2023 2025   CL 98 05/26/2023 2025   CO2 25 05/26/2023 2025   GLUCOSE 452 (H) 05/26/2023 2025   BUN 17 05/26/2023 2025   BUN 18 03/16/2023 1636   CREATININE 1.43 (H) 05/26/2023 2025   CALCIUM 7.7 (L) 05/26/2023 2025   PROT 6.3 (L) 05/26/2023 2025   PROT 6.1 01/19/2023 1632   ALBUMIN 2.0 (L) 05/26/2023 2025   ALBUMIN 3.4 (L) 01/19/2023 1632   AST 7 (L) 05/26/2023 2025   ALT 7 05/26/2023 2025   ALKPHOS 81 05/26/2023 2025   BILITOT 0.6 05/26/2023 2025   BILITOT <0.2 01/19/2023 1632   GFRNONAA 57 (L) 05/26/2023 2025   GFRAA >60 05/14/2019 0317    Imaging: MRI Right foot without contrast  Result Date: 05/26/2023 CLINICAL DATA:  Bony lesion in the foot EXAM: MRI OF THE RIGHT FOREFOOT WITHOUT CONTRAST TECHNIQUE: Multiplanar, multisequence MR imaging of the right forefoot was performed. No intravenous contrast was administered. COMPARISON:  Radiographs 04/07/2023 FINDINGS: Bones/Joint/Cartilage Osteomyelitis of the fifth metatarsal and phalanges of the fifth toe with  bony destructive findings in the head and distal metaphysis of the fifth metatarsal and proximally in the fifth toe. Overlying ulceration noted with gas tracking over adjacent to the head of the fifth metatarsal and fifth MTP joint. There may be some slight reactive endosteal edema the fourth metatarsal and fourth toe proximal phalanx but without compelling evidence of fourth digit osteomyelitis at this time. Edema medially in the head of the first metatarsal probably from arthropathy such as gout. Bifid medial sesamoid of the first digit. Ligaments The Lisfranc ligament is intact. Muscles and Tendons Diffuse low-grade atrophy and edema in the regional musculature is likely neurogenic. No intramuscular abscess. Soft tissues As noted above there is a large ulceration overlying the distal fifth metatarsal and fifth MTP joint with abnormal subcutaneous tissues in this vicinity and some speckled gas densities in the soft  tissues extending towards the regions of fifth digit bony destruction. Moderate dorsal subcutaneous edema in the forefoot, cellulitis not excluded. IMPRESSION: 1. Osteomyelitis of the fifth metatarsal and phalanges of the fifth toe with bony destructive findings in the head and distal metaphysis of the fifth metatarsal and proximally in the fifth toe. Overlying ulceration with gas tracking over adjacent to the head of the fifth metatarsal and fifth MTP joint. 2. There may be some slight reactive endosteal edema in the fourth metatarsal and fourth toe proximal phalanx but without compelling evidence of fourth digit osteomyelitis at this time. 3. Edema medially in the head of the first metatarsal probably from arthropathy such as gout. 4. Diffuse low-grade atrophy and edema in the regional musculature is likely neurogenic. 5. Moderate dorsal subcutaneous edema in the forefoot, cellulitis not excluded. Electronically Signed   By: Gaylyn Rong M.D.   On: 05/26/2023 20:56    ASSESSMENT & PLAN:   Assessment & Plan by Problem: Principal Problem:   Osteomyelitis of right lower extremity (HCC) Active Problems:   CAD (coronary artery disease)   Colostomy in place [facility])   Essential hypertension   Anemia   Elevated serum creatinine   PAD (peripheral artery disease) (HCC)   Diabetes (HCC)   EMETERIO KLAES is a 57 y.o. person living with a history of T2DM, HTN, PAD, HFpEF who presented with right foot ulcers and admitted for osteomyelitis on hospital day 1  #Right foot ulcers #Osteomyelitis Patient with >1 year history of diabetic foot ulcers, sent to ED from podiatrist office with concern for osteomyelitis. Wounds have progressed despite recently completed outpatient antibiotics. MRI foot confirms osteomyelitis of the fifth metatarsal and phalanges of the fifth toe. Patient is afebrile with no leukocytosis and stable vital signs, low concern for SIRS/sepsis. Will treat with oral antibiotics with  coverage of pseudomonas and MRSA as patient has had recurrent wound for >1 year and lack of response to outpatient therapy.  -Augmentin, ciprofloxacin, doxycycline started -Vascular surgery consulted -CRP, sed rate pending  #ASCVD #PAD #CAD Severe disease, CTA from 2018 shows chronic left common iliac occlusion and extensive plaque in abdominal aorta. Follows with [facility] as outpatient -CTA lower extremity pending -Vascular surgery consulted, appreciate recs -Continue home crestor  #Anemia Hemoglobin at arrival to ED 7.8. Has chronic anemia. Denies fatigue, dizziness, syncope. No blood or melanotic appearance of colostomy contents. Does have history of GERD with esophagitis -Iron studies, ESR, CRP pending -Continue home PPI  #Elevated creatinine Creatinine on arrival 1.43. Difficult to say baseline, seems to range from 1.2-2.5. History of diabetic kidney disease. Could be from ARB, which may have been started recently.  -  Hold ARB and furosemide -Holding IV fluids given shortage -UA pending  #T2DM -Continue home Semglee 40 units, Novolog 10 TID, sliding scale -Hold home metformin -Continue gabapentin and duloxetine for neuropathy  #HFpEF -Well-compensated at present. Holding furosemide in setting of elevated creatinine  #HTN -Continue home amlodipine 5, Imdur 30mg  -Holding home metoprolol, doesn't appear he has been filling    Diet: Regular, NPO at midnight VTE: SCDs IVF: None Code: Full  Prior to Admission Living Arrangement: At home with brother Anticipated Discharge Location: SNF Barriers to Discharge: pending medical stability  Dispo: Admit patient to Inpatient with expected length of stay greater than 2 midnights.  Signed: Monna Fam, MD Internal Medicine Resident PGY-1  05/27/2023, 12:39 AM

## 2023-05-26 NOTE — ED Notes (Signed)
Patient to CT.

## 2023-05-26 NOTE — Progress Notes (Signed)
Subjective:  Patient ID: Andres Richardson, male    DOB: 12/14/1965,  MRN: [id number]  Chief Complaint  Patient presents with   Wound Check    DIABETIC, DENIES N/V/F/C/SOB, HAVING PAIN, PAIN LEVEL 8-9/10, DRAINAGE YES, DIDN'T HAVE HIS MRI     57 y.o. male presents with the above complaint. History confirmed with patient.  Still feels that it is getting worse he did not go to the MRI again, says he has been changing it twice a day now  Objective:  Physical Exam: Weakly palpable pedal pulses foot is fairly warm and perfused well, plantar and medial ulcer now extensive the medial first MTPJ with necrotic subcutaneous tissue measuring 4.5 x 2.5 x 1.5 cm, has worsening malodor, still serous drainage, right lateral fifth metatarsal has necrotic ulcer measuring 3.0 x 2.5 x 1.0 cm.  Serous drainage as well.  Exposed capsule of both feet            Radiographs taken today of the right foot show no signs of erosion or osteomyelitis or soft tissue emphysema  Assessment:   1. 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 Type 2 diabetes mellitus with right diabetic foot infection (HCC)   3. Peripheral vascular disease (HCC)       Plan:  Patient was evaluated and treated and all questions answered.   Ulcer right foot -His wounds continue to worsen.  His compliance has been poor at managing this as an outpatient despite ordering of MRI and referral to vascular surgery and he has no showed both of these appointments.  I discussed with him that my main concern at this appointment would be continued deterioration of his wounds leading to osteomyelitis and further amputation likely which is already present in the first and fifth metatarsals.  I recommended today he proceed to the emergency room and he indicates that he will this afternoon after getting lunch.  On admission I would recommend he be started on IV antibiotics and MRI of the right foot be completed  and consultation with vascular surgery as well.  I discussed this with our on-call provider Dr. Jamse Arn and he will see him at the hospital as well as Dr. Karin Lieu from vascular surgery and they are going to consult as well for intervention.     No follow-ups on file.

## 2023-05-26 NOTE — Hospital Course (Addendum)
Osteomyelitis  Diabetic Foot Ulcers  PAD Andres Richardson is a 57 y.o. with a PMH significant for PAD, T2DM, HFpEF, HTN, Hyperlipidemia, who presented with medial and lateral diabetic ulcers on the right foot that had been worsening 6 weeks prior to admission, admitted for osteomyelitis. Was following podiatry outpatient. He had chronic feet ulceration on the medial and lateral right foot over calloused areas present for over 6 weeks, s/p doxycycline. Vascular surgery and podiatry were consulted, he was started on ciprofloxacin and doxycycline for one day, then transitioned to Pip-tazo and Vanc for three more days. He then transitioned to Cefepime, Vanc and Flagyl. Revascularization was done on 06/01/2023. He had high brachial bifurcation in the left arm and successful treatment of 99% SFA lesion with drug-eluting stent. 30 mm gradient in the infrarenal aorta was not treated as he had palpable femoral pulses. Last LDL at 60 03/2023. He is to continue on rosuvastatin 40mg  daily. Zetia 10mg  daily was added to his regimen. 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 IV antibiotics were discontinued after the procedure. He is to continue doxycycline and Augmentin for 8 more doses. He will need to follow up with Podiatry outpatient for his dressing changes and hospital follow up. He is to follow up with Vascular surgery.  Will need follow up in 4-6 wks with ABIs and RLE arterial duplex. Please ensure:   -That patient completed antibiotic regimen  -That he has followed up with Podiatry  -That he has ABIs and RLE arterial duplex done prior to follow up with Vascular  -That he has a scheduled follow-up with Vascular Surgery  -That he is taking rosuvastatin and zetia  -Provide smoking cessation counseling  -may benefit from exercise regimen  -Will do HHPT / HHOT  Diabetes  He is to use 60 units of long actin insulin nightly and 5 units with meals. FS Glucose was  100 today. Hypoglycemia precautions provided. He was very amenable to work with [name] as long as we enlisted him to report side effects (if any) of insulin, as this was his main complaint for not being compliant in the past getting nauseous with an aggressive insulin regimen.  -Please consider titrating regimen at next visit.   Elevated creatinine Had elevation in creatinine to 1.8 form baseline of ~1.1 when not measured in the setting of infection. This improved after switching from Vanc and Zosyn and transitioned to Cefepime, Vanc, and metronidazole. We also encouraged PO intake and held IV lasix for 2 doses. At discharge his creatinine was 1.6. He was instructed to keep drinking water.  -Please consider measuring an RFP at follow up   Hypertension Continue amlodipine and Imdur. Holding metoprolol, doesn't look like he's filling this. Holding ARB given elevated creatinine. Please consider following up on blood pressure and titrating medications as needed.   Chronic diastolic CHF Well compensated at present. Has history of flash pulmonary edema. Continue with Furosemide 40mg  daily   Concern for DVT  Right leg was more edematous that the left throughout his hospitalization. [name] was negative for DVT. Likely in the setting of infection.   Normocytic Anemia d/t AICD Hgb is in the 8-9 range. Continue monitoring with CBCs  GERD Will continue pt's protonix 40 mg every day.

## 2023-05-26 NOTE — ED Triage Notes (Signed)
Reports diabetic ulcer on bilateral sides of right foot.  Reports has not been able to get his MRI so his podiatrist sent him here for further evaluation for osteomyletitis.  Patients right leg is swollen significantly compared to left leg.  No redness noted on right leg.

## 2023-05-26 NOTE — ED Provider Notes (Signed)
[place] EMERGENCY DEPARTMENT AT [facility] Provider Note   CSN: [id number] Arrival date & time: 05/26/23  1316     History  Chief Complaint  Patient presents with   Diabetic Ulcer    Andres Richardson is a 57 y.o. male here with right foot pain.  History of diabetic foot wound worsening.  Currently under the care of Dr. Lilian Kapur at [facility].  Due to increased pain, discharge and worsening appearance of ulcerations he sentiment for rule out osteomyelitis and asked that we get an MRI of the foot.  HPI     Home Medications Prior to Admission medications   Medication Sig Start Date End Date Taking? Authorizing Provider  acetaminophen (TYLENOL) 500 MG tablet Take 1,000-1,500 mg by mouth 2 (two) times daily as needed for moderate pain.    [provider]  amLODipine (NORVASC) 5 MG tablet Take 1 tablet (5 mg total) by mouth daily. 03/16/23 03/15/24  Carmina Miller, DO  aspirin EC 81 MG tablet Take 1 tablet (81 mg total) by mouth daily. Swallow whole. 02/16/23   [name], [name], DO  Blood Glucose Monitoring Suppl DEVI May substitute to any manufacturer covered by patient's insurance. 02/16/23   [name], [name], DO  calcium carbonate (TUMS - DOSED IN MG ELEMENTAL CALCIUM) 500 MG chewable tablet Chew 1 tablet by mouth at bedtime.    [provider]  Continuous Glucose Sensor (DEXCOM G7 SENSOR) MISC USE AS DIRECTED ONCE A WEEK 01/12/23   Quincy Simmonds, MD  doxycycline (VIBRA-TABS) 100 MG tablet Take 1 tablet (100 mg total) by mouth 2 (two) times daily. 04/07/23   McDonald, Rachelle Hora, DPM  DULoxetine (CYMBALTA) 60 MG capsule Take 1 capsule (60 mg total) by mouth daily. 02/16/23 02/16/24  [name], [name], DO  furosemide (LASIX) 40 MG tablet Take 1 tablet (40 mg total) by mouth daily. 09/21/22   Adron Bene, MD  gabapentin (NEURONTIN) 100 MG capsule Take 2 capsules (200 mg total) by mouth 3 (three) times daily. 03/16/23   Carmina Miller, DO  gentamicin ointment  (GARAMYCIN) 0.1 % Apply 1 Application topically daily. 04/26/23   McDonald, Rachelle Hora, DPM  Glucagon (GVOKE HYPOPEN 1-PACK) 1 MG/0.2ML SOAJ Inject 1 mg into the skin as needed (low blood sugar with impaired consciousness). 03/28/23   Motwani, Carin Hock, MD  Glucose Blood (BLOOD GLUCOSE TEST STRIPS) STRP 1 each by In Vitro route in the morning, at noon, and at bedtime. May substitute to any manufacturer covered by patient's insurance. 02/16/23   [name], Florentina Addison, DO  Homeopathic Products (LEG CRAMPS) TABS Take 3 tablets by mouth daily as needed (leg cramps).    [provider]  insulin glargine (LANTUS) 100 UNIT/ML Solostar Pen Inject 25 Units into the skin 2 (two) times daily. 03/16/23   Carmina Miller, DO  insulin lispro (HUMALOG KWIKPEN) 100 UNIT/ML KwikPen Inject 11 Units into the skin 3 (three) times daily. 02/17/23 03/08/25  [name], [name], DO  Insulin Pen Needle (PEN NEEDLES) 32G X 4 MM MISC 1 Units by Does not apply route 4 (four) times daily. 07/29/22   Quincy Simmonds, MD  Iron, Ferrous Sulfate, 325 (65 Fe) MG TABS Take 1 tablet by mouth daily. 06/02/21   [provider]  isosorbide mononitrate (IMDUR) 30 MG 24 hr tablet Take 1 tablet (30 mg total) by mouth daily. 04/27/23   Lovie Macadamia, MD  Lancet Device MISC 1 each by Does not apply route in the morning, at noon, and at bedtime. May substitute  to any manufacturer covered by patient's insurance. 02/16/23   [name], Florentina Addison, DO  metFORMIN (GLUCOPHAGE) 1000 MG tablet Take 1 tablet (1,000 mg total) by mouth 2 (two) times daily with a meal. 09/21/22 03/28/23  Adron Bene, MD  metoprolol succinate (TOPROL-XL) 25 MG 24 hr tablet Take 1 tablet (25 mg total) by mouth daily. 09/21/22   Adron Bene, MD  montelukast (SINGULAIR) 10 MG tablet Take 1 tablet (10 mg total) by mouth daily. 09/21/22 02/18/24  Adron Bene, MD  Multiple Vitamin (MULTIVITAMIN WITH MINERALS) TABS tablet Take 1 tablet by mouth daily. 03/10/21   Almon Hercules, MD   nitroGLYCERIN (NITROSTAT) 0.4 MG SL tablet Place 1 tablet (0.4 mg total) under the tongue every 5 (five) minutes as needed for chest pain. 02/17/23   [name], [name], DO  pantoprazole (PROTONIX) 40 MG tablet Take 1 tablet (40 mg total) by mouth daily. 09/21/22 09/16/23  Adron Bene, MD  potassium chloride (KLOR-CON M) 10 MEQ tablet Take 1 tablet (10 mEq total) by mouth daily. 02/21/23   Morene Crocker, MD  rosuvastatin (CRESTOR) 40 MG tablet Take 1 tablet (40 mg total) by mouth daily. 05/12/23   Parke Poisson, MD  sertraline (ZOLOFT) 25 MG tablet Take 1 tablet (25 mg total) by mouth daily. 09/21/22 09/21/23  Adron Bene, MD      Allergies    Adhesive [tape]    Review of Systems   Review of Systems  Physical Exam Updated Vital Signs BP (!) 142/61   Pulse 95   Temp 97.7 F (36.5 C)   Resp 17   Ht 5\' 11"  (1.803 m)   Wt 89.4 kg   SpO2 100%   BMI 27.48 kg/m  Physical Exam Vitals and nursing note reviewed.  Constitutional:      General: He is not in acute distress.    Appearance: He is well-developed. He is not diaphoretic.  HENT:     Head: Normocephalic and atraumatic.  Eyes:     General: No scleral icterus.    Conjunctiva/sclera: Conjunctivae normal.  Cardiovascular:     Rate and Rhythm: Normal rate and regular rhythm.     Heart sounds: Normal heart sounds.  Pulmonary:     Effort: Pulmonary effort is normal. No respiratory distress.     Breath sounds: Normal breath sounds.  Abdominal:     Palpations: Abdomen is soft.     Tenderness: There is no abdominal tenderness.  Musculoskeletal:     Cervical back: Normal range of motion and neck supple.     Comments: Right foot is swollen as compared to the left.  Thready but palpable pulse noted in the right foot.  Ulcerations to the lateral and medial surface and ulceration between the fourth and fifth digit noted.  Skin:    General: Skin is warm and dry.  Neurological:     Mental Status: He is alert.  Psychiatric:         Behavior: Behavior normal.        ED Results / Procedures / Treatments   Labs (all labs ordered are listed, but only abnormal results are displayed) Labs Reviewed - No data to display  EKG None  Radiology No results found.  Procedures .Critical Care  Performed by: Arthor Captain, PA-C Authorized by: Arthor Captain, PA-C   Critical care provider statement:    Critical care time (minutes):  75   Critical care time was exclusive of:  Separately billable procedures and treating other patients   Critical care  was necessary to treat or prevent imminent or life-threatening deterioration of the following conditions: limb threatening illness, osteomyelitis.   Critical care was time spent personally by me on the following activities:  Development of treatment plan with patient or surrogate, discussions with consultants, evaluation of patient's response to treatment, examination of patient, ordering and review of laboratory studies, ordering and review of radiographic studies, ordering and performing treatments and interventions, pulse oximetry, re-evaluation of patient's condition and review of old charts     Medications Ordered in ED Medications - No data to display  ED Course/ Medical Decision Making/ A&P                                 Medical Decision Making Amount and/or Complexity of Data Reviewed Labs: ordered. Radiology: ordered.  Risk Prescription drug management. Decision regarding hospitalization.   This patient presents to the ED for concern of foot infection, this involves an extensive number of treatment options, and is a complaint that carries with it a high risk of complications and morbidity.  Osteomyelitis, cellulitis, vascular insufficiency  Co morbidities:   has a past medical history of CAD (coronary artery disease) (2022), Diabetes mellitus without complication (HCC), Fournier gangrene (2022), Grade I diastolic dysfunction (12/02/2021), Grade I  diastolic dysfunction (12/02/2021), History of creation of ostomy (HCC), Hyperlipidemia, Hypertension, Obesity, Peripheral arterial disease (HCC), Proteinuria, and Tobacco use disorder.   Social Determinants of Health:    SDOH Screenings   Food Insecurity: No Food Insecurity (05/27/2023)  Housing: Low Risk  (05/27/2023)  Transportation Needs: No Transportation Needs (05/27/2023)  Utilities: Not At Risk (05/27/2023)  Alcohol Screen: Low Risk  (02/16/2023)  Depression (PHQ2-9): Low Risk  (02/16/2023)  Financial Resource Strain: Low Risk  (02/16/2023)  Physical Activity: Insufficiently Active (02/16/2023)  Social Connections: Moderately Isolated (02/16/2023)  Stress: No Stress Concern Present (02/16/2023)  Tobacco Use: High Risk (05/26/2023)     Additional history:  {Additional history obtained from patient and friend at bedside {External records from outside source obtained and reviewed including outpatient podiatry notes  Lab Tests:  I Ordered, and personally interpreted labs.  The pertinent results include:  \ CBC without elevated leukocytosis, Hemoglobin down to 7.8, previously 11.6 unsure of etiology CMP with significantly elevated glucose at 452, elevated creatinine appears to be baseline following Blood cultures pending Imaging Studies:  I ordered imaging studies including  MRI of the right foot  I independently visualized and interpreted imaging which showed acute osteomyelitis I agree with the radiologist interpretation  Cardiac Monitoring/ECG:  The patient was maintained on a cardiac monitor.  I personally viewed and interpreted the cardiac monitored which showed an underlying rhythm of: Sinus rhythm  Medicines ordered and prescription drug management:  I ordered medication including  Pain medication for foot pain Reevaluation of the patient after these medicines showed that the patient improved I have reviewed the patients home medicines and have made adjustments as  needed  Test Considered:   blood cultures pending  Critical Interventions:    Consultations Obtained: Dr. Juanetta Gosling with vascular surgery and Dr. Jamse Arn of podiatry  Problem List / ED Course:     ICD-10-CM   1. Osteomyelitis of right foot, unspecified type (HCC)  M86.9       MDM: Patient with chronic foot wounds now osteomyelitis, limb threatening infection.  Needs admission.   Dispostion:  After consideration of the diagnostic results and the patients response to treatment, I  feel that the patent would benefit from mission.         Final Clinical Impression(s) / ED Diagnoses Final diagnoses:  None    Rx / DC Orders ED Discharge Orders     None         Arthor Captain, PA-C 05/27/23 1439    Benjiman Core, MD 05/30/23 [phone]

## 2023-05-27 ENCOUNTER — Inpatient Hospital Stay (HOSPITAL_COMMUNITY): Payer: Medicare Other

## 2023-05-27 DIAGNOSIS — M869 Osteomyelitis, unspecified: Secondary | ICD-10-CM | POA: Diagnosis not present

## 2023-05-27 DIAGNOSIS — E11621 Type 2 diabetes mellitus with foot ulcer: Secondary | ICD-10-CM | POA: Diagnosis not present

## 2023-05-27 DIAGNOSIS — Z794 Long term (current) use of insulin: Secondary | ICD-10-CM | POA: Diagnosis not present

## 2023-05-27 DIAGNOSIS — L97509 Non-pressure chronic ulcer of other part of unspecified foot with unspecified severity: Secondary | ICD-10-CM

## 2023-05-27 DIAGNOSIS — I700 Atherosclerosis of aorta: Secondary | ICD-10-CM | POA: Diagnosis present

## 2023-05-27 LAB — IRON AND TIBC
Iron: 27 ug/dL — ABNORMAL LOW (ref 45–182)
Saturation Ratios: 12 % — ABNORMAL LOW (ref 17.9–39.5)
TIBC: 227 ug/dL — ABNORMAL LOW (ref 250–450)
UIBC: 200 ug/dL

## 2023-05-27 LAB — C-REACTIVE PROTEIN: CRP: 3.3 mg/dL — ABNORMAL HIGH (ref <1.0)

## 2023-05-27 LAB — GLUCOSE, CAPILLARY
Glucose-Capillary: 166 mg/dL — ABNORMAL HIGH (ref 70–99)
Glucose-Capillary: 230 mg/dL — ABNORMAL HIGH (ref 70–99)
Glucose-Capillary: 265 mg/dL — ABNORMAL HIGH (ref 70–99)
Glucose-Capillary: 305 mg/dL — ABNORMAL HIGH (ref 70–99)
Glucose-Capillary: 335 mg/dL — ABNORMAL HIGH (ref 70–99)

## 2023-05-27 LAB — VAS US ABI WITH/WO TBI
Left ABI: 0.41
Right ABI: 0.57

## 2023-05-27 LAB — SEDIMENTATION RATE: Sed Rate: 114 mm/h — ABNORMAL HIGH (ref 0–16)

## 2023-05-27 LAB — FERRITIN: Ferritin: 183 ng/mL (ref 24–336)

## 2023-05-27 LAB — HIV ANTIBODY (ROUTINE TESTING W REFLEX): HIV Screen 4th Generation wRfx: NONREACTIVE

## 2023-05-27 MED ORDER — VANCOMYCIN HCL 1500 MG/300ML IV SOLN
1500.0000 mg | Freq: Once | INTRAVENOUS | Status: AC
  Administered 2023-05-27: 1500 mg via INTRAVENOUS
  Filled 2023-05-27: qty 300

## 2023-05-27 MED ORDER — ACETAMINOPHEN 500 MG PO TABS
1000.0000 mg | ORAL_TABLET | Freq: Four times a day (QID) | ORAL | Status: DC
  Administered 2023-05-27 – 2023-06-06 (×37): 1000 mg via ORAL
  Filled 2023-05-27 (×40): qty 2

## 2023-05-27 MED ORDER — ASPIRIN 81 MG PO TBEC
81.0000 mg | DELAYED_RELEASE_TABLET | Freq: Every day | ORAL | Status: DC
  Administered 2023-05-27 – 2023-05-30 (×4): 81 mg via ORAL
  Filled 2023-05-27 (×4): qty 1

## 2023-05-27 MED ORDER — ENOXAPARIN SODIUM 40 MG/0.4ML IJ SOSY
40.0000 mg | PREFILLED_SYRINGE | INTRAMUSCULAR | Status: DC
  Administered 2023-05-27 – 2023-06-05 (×8): 40 mg via SUBCUTANEOUS
  Filled 2023-05-27 (×8): qty 0.4

## 2023-05-27 MED ORDER — PIPERACILLIN-TAZOBACTAM 3.375 G IVPB
3.3750 g | Freq: Three times a day (TID) | INTRAVENOUS | Status: DC
  Administered 2023-05-27 – 2023-05-30 (×9): 3.375 g via INTRAVENOUS
  Filled 2023-05-27 (×9): qty 50

## 2023-05-27 MED ORDER — FUROSEMIDE 40 MG PO TABS
40.0000 mg | ORAL_TABLET | Freq: Every day | ORAL | Status: DC
  Administered 2023-05-27 – 2023-05-28 (×2): 40 mg via ORAL
  Filled 2023-05-27 (×2): qty 1

## 2023-05-27 NOTE — ED Notes (Signed)
Patient transported with portable tele monitor in use, by patient transport

## 2023-05-27 NOTE — Plan of Care (Signed)
  Problem: Coping: Goal: Ability to adjust to condition or change in health will improve Outcome: Progressing   Problem: Skin Integrity: Goal: Risk for impaired skin integrity will decrease Outcome: Progressing   Problem: Tissue Perfusion: Goal: Adequacy of tissue perfusion will improve Outcome: Progressing   Problem: Education: Goal: Knowledge of General Education information will improve Description: Including pain rating scale, medication(s)/side effects and non-pharmacologic comfort measures Outcome: Progressing

## 2023-05-27 NOTE — Consult Note (Addendum)
PODIATRY CONSULTATION  NAME Andres Richardson MRN [id number] DOB 02-08-1966 DOA 05/26/2023   Reason for consult:  Chief Complaint  Patient presents with   Diabetic Ulcer    Attending physician: Dr. Dickie La, MD  History of present illness: 57 y.o. male with past medical history of heart failure with preserved ejection fraction, hypertension, type 2 diabetes, CAD, PAD, HLD, history of sacral wound.  Patient presented to the hospital from the office of Dr. Lilian Kapur for right foot osteomyelitis.  He has had chronic nonhealing ulcers for close to a year to the right fifth metatarsal head region, first metatarsal head region.  He is been on oral antibiotics in the outpatient setting.  Patient underwent CT angiogram, per vascular planning on revascularization early next week.  Patient denies nausea, vomiting, fever, chills, chest pain, shortness of breath.  Past Medical History:  Diagnosis Date   CAD (coronary artery disease) 2022     Prox RCA lesion is 100% stenosed.   Prox Cx to Mid Cx lesion is 60% stenosed.   LPAV lesion is 60% stenosed.   Ost LAD to Prox LAD lesion is 40% stenosed.   Diabetes mellitus without complication (HCC)    Type !! 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 Diabetic neuropathy.   Fournier gangrene 2022   Grade I diastolic dysfunction 12/02/2021   Grade I diastolic dysfunction 12/02/2021   History of creation of ostomy [facility])    Hyperlipidemia    Mixed   Hypertension    Essential   Obesity    Peripheral arterial disease (HCC)    occluded left common iliac artery   Proteinuria    Tobacco use disorder        Latest Ref Rng & Units 05/26/2023    8:25 PM 03/16/2023    4:36 PM 02/20/2023   12:58 AM  CBC  WBC 4.0 - 10.5 K/uL 8.2  9.0  7.4   Hemoglobin 13.0 - 17.0 g/dL 7.8  40.9  81.1   Hematocrit 39.0 - 52.0 % 23.7  33.8  31.7   Platelets 150 - 400 K/uL 268  240  229        Latest Ref Rng & Units 05/26/2023    8:25 PM 03/23/2023   10:58 AM 03/16/2023     4:36 PM  BMP  Glucose 70 - 99 mg/dL 914  782  956   BUN 6 - 20 mg/dL 17  33  18   Creatinine 0.61 - 1.24 mg/dL 2.13  0.86  5.78   BUN/Creat Ratio 9 - 20   16   Sodium 135 - 145 mmol/L 133  134  139   Potassium 3.5 - 5.1 mmol/L 3.4  4.0  4.5   Chloride 98 - 111 mmol/L 98  102  105   CO2 22 - 32 mmol/L 25  23  18    Calcium 8.9 - 10.3 mg/dL 7.7  8.8  8.2       Physical Exam: Lower Extremity Exam Vasc: Faintly palpable DP pulses bilaterally, nonpalpable PT pulses.  Cap refill time less than 3 seconds to digits.  Right lower extremity is edematous with erythema to forefoot  Derm: Left lower extremity no open wounds or lesions appreciated.  Right foot lateral fifth metatarsal phalangeal joint ulceration measuring 3 x 2.5 x 1 cm, positive malodor, positive probe to bone, evidence of liquefactive necrosis, serous drainage present.  Medial first MTPJ ulceration with necrotic subcutaneous tissue measuring 4.5 x 2.5 x 1.5 cm, this overall appears stable.  MSK:  Manual muscle strength testing 5/5 for all major muscle groups lower extremity  Neuro: Light touch sensation diminished, Gross sensation intact.  Gross motor sensation intact       ASSESSMENT/PLAN OF CARE 57 y.o. male with PMHx significant for above past medical history most notably diabetes type 2 with CAD, PAD presenting with osteomyelitis right foot with associated necrotic fifth MPJ ulceration, first MPJ ulceration down to subcutaneous tissue  -Clinical findings and treatment plan discussed with patient -Xeroform and Mepilex applied to the ulceration sites -MRI findings of osteomyelitis to fifth MPJ discussed with patient.  Will inquire about increased marrow edema signal at the first MPJ with radiologist. -Eventual plans for surgical intervention following revascularization to the right lower extremity. - Continue IV abx broad spectrum pending further culture data -Daily dressing changes of Mepilex with betadine per  nursing - Will continue to follow   Thank you for the consult.  Please contact me directly with any questions or concerns.           Bronwen Betters, DPM [facility] / [facility]    [address] [place], [place] [address]                Office [phone]  Fax [phone]

## 2023-05-27 NOTE — Progress Notes (Addendum)
Overview: Andres Richardson is a 57 y.o. person living with a history of T2DM, HTN, PAD, HFpEF who presented with right foot ulcers and admitted for osteomyelitis.  Overnight: NAEON  Subjective:  Pt states he is having pain in his right foot. It got better with the pain medications given to him in the ED. He is very hungry and wants to eat.    Objective: afebrile, vitals within normal limits, satting well on RA  Vital signs in last 24 hours: Vitals:   05/27/23 0417 05/27/23 0500 05/27/23 0741 05/27/23 1137  BP: 135/78  (!) 122/56 116/62  Pulse: 85  79 94  Resp: 18  18 18   Temp: 98.4 F (36.9 C)  98.2 F (36.8 C) 98.4 F (36.9 C)  TempSrc: Oral     SpO2: 100%  98% 98%  Weight:  89.6 kg    Height:       Supplemental O2: Room Air   SpO2: 98 % Filed Weights   05/26/23 1332 05/27/23 0500  Weight: 89.4 kg 89.6 kg   No intake or output data in the 24 hours ending 05/27/23 1154 Net IO Since Admission: No IO data has been entered for this period [05/27/23 1154]  Physical Exam General: NAD HENT: NCAT, diminished PT pusles Lungs: CTAB MSK: Right foot with two ulcers without any active drainage (see media tab for details) Neuro: alert and oriented x4 Psych: normal mood and normal affect  Diagnostics    Latest Ref Rng & Units 05/26/2023    8:25 PM 03/16/2023    4:36 PM 02/20/2023   12:58 AM  CBC  WBC 4.0 - 10.5 K/uL 8.2  9.0  7.4   Hemoglobin 13.0 - 17.0 g/dL 7.8  16.1  09.6   Hematocrit 39.0 - 52.0 % 23.7  33.8  31.7   Platelets 150 - 400 K/uL 268  240  229        Latest Ref Rng & Units 05/26/2023    8:25 PM 03/23/2023   10:58 AM 03/16/2023    4:36 PM  BMP  Glucose 70 - 99 mg/dL 045  409  811   BUN 6 - 20 mg/dL 17  33  18   Creatinine 0.61 - 1.24 mg/dL 9.14  7.82  9.56   BUN/Creat Ratio 9 - 20   16   Sodium 135 - 145 mmol/L 133  134  139   Potassium 3.5 - 5.1 mmol/L 3.4  4.0  4.5   Chloride 98 - 111 mmol/L 98  102  105   CO2 22 - 32 mmol/L 25  23  18    Calcium  8.9 - 10.3 mg/dL 7.7  8.8  8.2     Iron/TIBC/Ferritin/ %Sat Iron 27, Tsat 12 Ferritin 183 ESR 114 CRP 3.3  Assessment/Plan: Principal Problem:   Osteomyelitis of right lower extremity (HCC) Active Problems:   CAD (coronary artery disease)   Colostomy in place [facility])   Essential hypertension   Anemia   Elevated serum creatinine   PAD (peripheral artery disease) (HCC)   Diabetes (HCC)  Medial/lateral right foot ulcers c/b fifth toe osteomyelitis Pt with chronic ulceration that have progressed to osteomyelitis. He has poor circulation so vascular is consulted given need for intervention. CTA of lower extremity shows multiple areas of stenosis. No immediate plan for intervention but vascular surgery is following and may have a bypass next week. Podiatry plans to see pt in the pm. Will start pt on IV Zosyn and Vancomycin. Blood cultures  are pending. 5 mg oxycodone ordered for pain but this can be increased if needed. Appreciate both vascular surgery and podiatry's assistance with this patient.    Hyperlipidemia PAD/CAD CTA shows significant stenosis.  Appears this is playing a role in patient's poor wound healing.  Patient on crestor and aspirin.  Last LDL was 78 3 months ago.  Will add Zetia to his regimen to given goal is less than 70 at least and even lower goal might be beneficial given his disease burden.   T2DM c/b neuropathy Pt has history of type 2 diabetes and is on insulin.  Patient is not happy that his regimen gets altered quite often.  Will continue patient's long-acting insulin of 40 units given he is no longer NPO and place him on moderate intensity sliding scale.  Will titrate his insulin during this hospitalization. Will continue his gabapentin.   Normocytic Anemia Hgb is 7.8.  Iron panel showed iron at 27 with saturation at 12%, TIBC also low at 227 and ferritin 183. Most consistent with AICD.  HFpEF HTN Chronic. Patient has HFpEF and is on Lasix 40 mg daily.  Will  continue that regimen here given he had history of flash pulmonary edema. For his HTN is amlodipine 5 mg every day. Will continue this.   CKDIIIa Pt's creatinine at baseline. Will restart lasix as mentioned above.   GERD Will continue pt's protonix 40 mg every day.   Diet: Carb-Modified IVF: None VTE: Enoxaparin Code: Full PT/OT recs: Pending Prior to Admission Living Arrangement: Home Anticipated Discharge Location: TBD/likely home Barriers to Discharge: Medical Stability Dispo: Anticipated discharge in approximately 3-4 day(s).   Gwenevere Abbot, MD [name]. [facility] Internal Medicine Residency, PGY-3  Pager: [phone] After 5 pm and on weekends: Please call the on-call pager

## 2023-05-27 NOTE — Progress Notes (Signed)
ABI has been completed.   Results can be found under chart review under CV PROC. 05/27/2023 1:11 PM Norvell Ureste RVT, RDMS

## 2023-05-27 NOTE — Plan of Care (Signed)
  Problem: Coping: Goal: Ability to adjust to condition or change in health will improve Outcome: Progressing   Problem: Education: Goal: Knowledge of General Education information will improve Description: Including pain rating scale, medication(s)/side effects and non-pharmacologic comfort measures Outcome: Progressing   Problem: Clinical Measurements: Goal: Cardiovascular complication will be avoided Outcome: Progressing

## 2023-05-27 NOTE — Progress Notes (Signed)
CT angiogram reviewed in detail. Plan for revascularization early next week with Dr. Karin Lieu. He will be back Monday to review and make a plan for patient. Please call over the weekend for any acute issues.  Rande Brunt. Lenell Antu, MD [facility] Phone Number: [phone] 05/27/2023 12:22 PM

## 2023-05-27 NOTE — Progress Notes (Addendum)
Pharmacy Antibiotic Note  Andres Richardson is a 57 y.o. male admitted on 05/26/2023 with  a diabetic foot wound .  Pharmacy has been consulted for vancomycin dosing. Patient's renal function declined overnight, will use new calculated AUC dosing and if renal function declines further will use levels to guide therapy.   Plan:  Vancomycin 1500mg  IV q24 hours (eAUC 464) Monitor renal function and adjust dose as needed   Height: 5\' 11"  (180.3 cm) Weight: 89.6 kg (197 lb 8.5 oz) IBW/kg (Calculated) : 75.3  Temp (24hrs), Avg:98.2 F (36.8 C), Min:97.9 F (36.6 C), Max:98.4 F (36.9 C)  Recent Labs  Lab 05/26/23 2025 05/26/23 2036  WBC 8.2  --   CREATININE 1.43*  --   LATICACIDVEN  --  0.9    Estimated Creatinine Clearance: 60.7 mL/min (A) (by C-G formula based on SCr of 1.43 mg/dL (H)).    Allergies  Allergen Reactions   Adhesive [Tape] Rash    Antimicrobials this admission: Zosyn 10/11 >>  Vancomycin 10/11 >>    Microbiology results: 10/11 x 2  BCx: in process at this time.    Thank you for allowing pharmacy to be a part of this patient's care.  Ruben Im, PharmD Clinical Pharmacist 05/28/2023 11:54 AM Please check AMION for all [facility] Pharmacy numbers

## 2023-05-27 NOTE — Progress Notes (Signed)
Pharmacy Antibiotic Note  Andres Richardson is a 57 y.o. male admitted on 05/26/2023 with  diabetic foot wound .  Pharmacy has been consulted for vancomycin and Zosyn dosing.  Plan: Vancomycin 1500 mg one-time loading dose and  Zosyn 3.375g IV q8h (4 hour infusion).   Height: 5\' 11"  (180.3 cm) Weight: 89.6 kg (197 lb 8.5 oz) IBW/kg (Calculated) : 75.3  Temp (24hrs), Avg:98.2 F (36.8 C), Min:97.9 F (36.6 C), Max:98.4 F (36.9 C)  Recent Labs  Lab 05/26/23 2025 05/26/23 2036  WBC 8.2  --   CREATININE 1.43*  --   LATICACIDVEN  --  0.9    Estimated Creatinine Clearance: 60.7 mL/min (A) (by C-G formula based on SCr of 1.43 mg/dL (H)).    Allergies  Allergen Reactions   Adhesive [Tape] Rash    Antimicrobials this admission: Zosyn 10/11 >>  Vancomycin 10/11 >>   Microbiology results: 10/10 BCx x 2: In process  Thank you for allowing pharmacy to be a part of this patient's care.  Elicia Lamp, PharmD, CPP 05/27/2023 1:27 PM

## 2023-05-27 NOTE — Inpatient Diabetes Management (Signed)
Inpatient Diabetes Program Recommendations  AACE/ADA: New Consensus Statement on Inpatient Glycemic Control (2015)  Target Ranges:  Prepandial:   less than 140 mg/dL      Peak postprandial:   less than 180 mg/dL (1-2 hours)      Critically ill patients:  140 - 180 mg/dL   Lab Results  Component Value Date   GLUCAP 265 (H) 05/27/2023   HGBA1C 10.3 (H) 03/23/2023    Review of Glycemic Control  Latest Reference Range & Units 05/27/23 00:24 05/27/23 07:40  Glucose-Capillary 70 - 99 mg/dL 161 (H) 096 (H)   Diabetes history: DM  Outpatient Diabetes medications:  Metformin 1000 mg bid Humalog 11-15 units tid with meals Lantus 34 units q HS Dexcom sensor G-voke for hypoglycemia Current orders for Inpatient glycemic control:  Novolog 0-15 units tid with meals Novolog 10 units tid with meals Semglee 40 units q HS Inpatient Diabetes Program Recommendations:   Agree with current orders.  Patient see's endocrinology with last visit on 03/28/23.   Will follow.   Thanks,  Beryl Meager, RN, BC-ADM Inpatient Diabetes Coordinator Pager [phone]  (8a-5p)

## 2023-05-28 DIAGNOSIS — M869 Osteomyelitis, unspecified: Secondary | ICD-10-CM | POA: Diagnosis not present

## 2023-05-28 LAB — CBC WITH DIFFERENTIAL/PLATELET
Abs Immature Granulocytes: 0.02 10*3/uL (ref 0.00–0.07)
Basophils Absolute: 0 10*3/uL (ref 0.0–0.1)
Basophils Relative: 0 %
Eosinophils Absolute: 0.2 10*3/uL (ref 0.0–0.5)
Eosinophils Relative: 3 %
HCT: 28.3 % — ABNORMAL LOW (ref 39.0–52.0)
Hemoglobin: 9.4 g/dL — ABNORMAL LOW (ref 13.0–17.0)
Immature Granulocytes: 0 %
Lymphocytes Relative: 9 %
Lymphs Abs: 0.6 10*3/uL — ABNORMAL LOW (ref 0.7–4.0)
MCH: 27.5 pg (ref 26.0–34.0)
MCHC: 33.2 g/dL (ref 30.0–36.0)
MCV: 82.7 fL (ref 80.0–100.0)
Monocytes Absolute: 0.4 10*3/uL (ref 0.1–1.0)
Monocytes Relative: 6 %
Neutro Abs: 5.5 10*3/uL (ref 1.7–7.7)
Neutrophils Relative %: 82 %
Platelets: 246 10*3/uL (ref 150–400)
RBC: 3.42 MIL/uL — ABNORMAL LOW (ref 4.22–5.81)
RDW: 14.6 % (ref 11.5–15.5)
WBC: 6.8 10*3/uL (ref 4.0–10.5)
nRBC: 0 % (ref 0.0–0.2)

## 2023-05-28 LAB — BASIC METABOLIC PANEL
Anion gap: 12 (ref 5–15)
BUN: 16 mg/dL (ref 6–20)
CO2: 23 mmol/L (ref 22–32)
Calcium: 7.9 mg/dL — ABNORMAL LOW (ref 8.9–10.3)
Chloride: 102 mmol/L (ref 98–111)
Creatinine, Ser: 1.81 mg/dL — ABNORMAL HIGH (ref 0.61–1.24)
GFR, Estimated: 43 mL/min — ABNORMAL LOW (ref >60)
Glucose, Bld: 299 mg/dL — ABNORMAL HIGH (ref 70–99)
Potassium: 3.8 mmol/L (ref 3.5–5.1)
Sodium: 137 mmol/L (ref 135–145)

## 2023-05-28 LAB — GLUCOSE, CAPILLARY
Glucose-Capillary: 262 mg/dL — ABNORMAL HIGH (ref 70–99)
Glucose-Capillary: 353 mg/dL — ABNORMAL HIGH (ref 70–99)
Glucose-Capillary: 356 mg/dL — ABNORMAL HIGH (ref 70–99)
Glucose-Capillary: 176 mg/dL — ABNORMAL HIGH (ref 70–99)

## 2023-05-28 MED ORDER — VANCOMYCIN HCL 1500 MG/300ML IV SOLN
1500.0000 mg | INTRAVENOUS | Status: DC
  Administered 2023-05-28 – 2023-05-31 (×4): 1500 mg via INTRAVENOUS
  Filled 2023-05-28 (×4): qty 300

## 2023-05-28 MED ORDER — INSULIN ASPART 100 UNIT/ML IJ SOLN
0.0000 [IU] | Freq: Every day | INTRAMUSCULAR | Status: DC
  Administered 2023-05-29: 2 [IU] via SUBCUTANEOUS
  Administered 2023-05-30 – 2023-06-01 (×3): 3 [IU] via SUBCUTANEOUS
  Administered 2023-06-04 – 2023-06-05 (×2): 2 [IU] via SUBCUTANEOUS

## 2023-05-28 MED ORDER — INSULIN ASPART 100 UNIT/ML IJ SOLN
0.0000 [IU] | Freq: Three times a day (TID) | INTRAMUSCULAR | Status: DC
  Administered 2023-05-28: 8 [IU] via SUBCUTANEOUS
  Administered 2023-05-28 – 2023-05-29 (×2): 20 [IU] via SUBCUTANEOUS
  Administered 2023-05-29: 15 [IU] via SUBCUTANEOUS
  Administered 2023-05-29: 3 [IU] via SUBCUTANEOUS
  Administered 2023-05-30: 11 [IU] via SUBCUTANEOUS
  Administered 2023-05-30: 15 [IU] via SUBCUTANEOUS
  Administered 2023-05-30: 4 [IU] via SUBCUTANEOUS
  Administered 2023-05-31: 15 [IU] via SUBCUTANEOUS
  Administered 2023-05-31: 4 [IU] via SUBCUTANEOUS
  Administered 2023-05-31: 11 [IU] via SUBCUTANEOUS
  Administered 2023-06-01: 4 [IU] via SUBCUTANEOUS
  Administered 2023-06-01: 11 [IU] via SUBCUTANEOUS
  Administered 2023-06-01: 12 [IU] via SUBCUTANEOUS
  Administered 2023-06-02: 7 [IU] via SUBCUTANEOUS
  Administered 2023-06-02: 4 [IU] via SUBCUTANEOUS
  Administered 2023-06-02: 7 [IU] via SUBCUTANEOUS
  Administered 2023-06-03 – 2023-06-04 (×3): 4 [IU] via SUBCUTANEOUS
  Administered 2023-06-04: 3 [IU] via SUBCUTANEOUS
  Administered 2023-06-04 – 2023-06-06 (×3): 4 [IU] via SUBCUTANEOUS

## 2023-05-28 MED ORDER — EZETIMIBE 10 MG PO TABS
10.0000 mg | ORAL_TABLET | Freq: Every day | ORAL | Status: DC
  Administered 2023-05-28 – 2023-06-06 (×10): 10 mg via ORAL
  Filled 2023-05-28 (×10): qty 1

## 2023-05-28 NOTE — Progress Notes (Signed)
Overview: Andres Richardson is a 57 y.o. person living with a history of T2DM, HTN, PAD, HFpEF who presented with right foot ulcers and admitted for osteomyelitis.  Overnight: NAEON  Subjective:  States he is feeling well. No acute concerns. Had questions about the procedure and what part of his foot will be taken off. Advised he should ask podiatry for definitive answer.    Objective: afebrile, BP 130s/60s, satting well on RA  Vital signs in last 24 hours: Vitals:   05/27/23 0741 05/27/23 1137 05/27/23 1551 05/27/23 2132  BP: (!) 122/56 116/62 110/65 (!) 152/68  Pulse: 79 94 93 88  Resp: 18 18 18 18   Temp: 98.2 F (36.8 C) 98.4 F (36.9 C) 97.7 F (36.5 C) 98.2 F (36.8 C)  TempSrc:      SpO2: 98% 98% 99% 97%  Weight:      Height:       Supplemental O2: Room Air   SpO2: 97 % Filed Weights   05/26/23 1332 05/27/23 0500  Weight: 89.4 kg 89.6 kg    Intake/Output Summary (Last 24 hours) at 05/28/2023 0559 Last data filed at 05/28/2023 1610 Gross per 24 hour  Intake 96.19 ml  Output --  Net 96.19 ml   Net IO Since Admission: 96.19 mL [05/28/23 0559]  Physical Exam General: NAD HENT: NCAT, diminished PT pusles Lungs: CTAB MSK: Right foot with two ulcers without any active drainage (see media tab for details) Neuro: alert and oriented x4 Psych: normal mood and normal affect  Diagnostics    Latest Ref Rng & Units 05/26/2023    8:25 PM 03/16/2023    4:36 PM 02/20/2023   12:58 AM  CBC  WBC 4.0 - 10.5 K/uL 8.2  9.0  7.4   Hemoglobin 13.0 - 17.0 g/dL 7.8  96.0  45.4   Hematocrit 39.0 - 52.0 % 23.7  33.8  31.7   Platelets 150 - 400 K/uL 268  240  229        Latest Ref Rng & Units 05/26/2023    8:25 PM 03/23/2023   10:58 AM 03/16/2023    4:36 PM  BMP  Glucose 70 - 99 mg/dL 098  119  147   BUN 6 - 20 mg/dL 17  33  18   Creatinine 0.61 - 1.24 mg/dL 8.29  5.62  1.30   BUN/Creat Ratio 9 - 20   16   Sodium 135 - 145 mmol/L 133  134  139   Potassium 3.5 - 5.1  mmol/L 3.4  4.0  4.5   Chloride 98 - 111 mmol/L 98  102  105   CO2 22 - 32 mmol/L 25  23  18    Calcium 8.9 - 10.3 mg/dL 7.7  8.8  8.2    Assessment/Plan: Principal Problem:   Osteomyelitis of right lower extremity (HCC) Active Problems:   CAD (coronary artery disease)   Colostomy in place [facility])   Essential hypertension   Anemia   Elevated serum creatinine   PAD (peripheral artery disease) (HCC)   Diabetes (HCC)   Aortic atherosclerosis (HCC)  Medial/lateral right foot ulcers c/b fifth toe osteomyelitis Pt with chronic ulceration that have progressed to osteomyelitis. He has poor circulation so vascular is consulted given need for intervention. CTA of lower extremity shows multiple areas of stenosis. No immediate plan for intervention but vascular surgery is following and may have a bypass next week. Podiatry plans pending vascular intervention.  Will continue pt on IV Zosyn and  Vancomycin. Blood cultures are pending. 5 mg oxycodone ordered for pain but this can be increased if needed. Appreciate both vascular surgery and podiatry's assistance with this patient. Likely will have intervention early next week. Will continue with daily dressing changes of Mepilex with betadine per nursing.   Hyperlipidemia PAD/CAD CTA shows significant stenosis.  Appears this is playing a role in patient's poor wound healing.  Patient on crestor and aspirin.  Last LDL was 78 3 months ago.  Will add Zetia to his regimen to given goal is less than 70 at least and even lower goal might be beneficial given his disease burden.   T2DM c/b neuropathy Pt has history of type 2 diabetes and is on insulin.  Patient is not happy that his regimen gets altered quite often.  Will continue patient's long-acting insulin of 40 units given he is no longer NPO and increase him to resistant sliding scale insulin.  Will titrate his long acting insulin during this hospitalization based on his sliding scale use. Will continue his  gabapentin.   Normocytic Anemia Hgb is 7.8.  Iron panel showed iron at 27 with saturation at 12%, TIBC also low at 227 and ferritin 183. Most consistent with AICD.  HFpEF HTN Chronic. Patient has HFpEF and is on Lasix 40 mg daily.  Will continue that regimen here given he had history of flash pulmonary edema. For his HTN is amlodipine 5 mg every day. Will continue this. BP this am is 139/57  CKDIIIa Pt's creatinine at baseline. Slightly elevated today. Continue lasix as mentioned above. If it increases further, then we will discontinue lasix.   GERD Will continue pt's protonix 40 mg every day.   Diet: Carb-Modified IVF: None VTE: Enoxaparin Code: Full PT/OT recs: Pending after procedue Prior to Admission Living Arrangement: Home Anticipated Discharge Location: TBD/likely home Barriers to Discharge: Medical Stability Dispo: Anticipated discharge in approximately 3-4 day(s).   Gwenevere Abbot, MD [name]. [facility] Internal Medicine Residency, PGY-3  Pager: [phone] After 5 pm and on weekends: Please call the on-call pager

## 2023-05-28 NOTE — Progress Notes (Signed)
PODIATRY PROGRESS NOTE  NAME Andres Richardson MRN [id number] DOB [birth year] DOA 05/26/2023   Reason for consult:  Chief Complaint  Patient presents with   Diabetic Ulcer     History of present illness: 57 y.o. male with heart failure, PAD, type 2 diabetes with right foot osteomyelitis.  Patient is seen resting comfortably bedside.  No dressings applied to the right foot currently.  Patient denies pain.  He denies nausea, vomiting, fever, chills, chest pain, shortness of breath.  Vitals:   05/28/23 0629 05/28/23 1504  BP: (!) 139/57 134/71  Pulse: 85 93  Resp: 19 18  Temp: 98.3 F (36.8 C) 98.6 F (37 C)  SpO2: 98% 98%       Latest Ref Rng & Units 05/28/2023    7:11 AM 05/26/2023    8:25 PM 03/16/2023    4:36 PM  CBC  WBC 4.0 - 10.5 K/uL 6.8  8.2  9.0   Hemoglobin 13.0 - 17.0 g/dL 9.4  7.8  13.2   Hematocrit 39.0 - 52.0 % 28.3  23.7  33.8   Platelets 150 - 400 K/uL 246  268  240        Latest Ref Rng & Units 05/28/2023    7:11 AM 05/26/2023    8:25 PM 03/23/2023   10:58 AM  BMP  Glucose 70 - 99 mg/dL 440  102  725   BUN 6 - 20 mg/dL 16  17  33   Creatinine 0.61 - 1.24 mg/dL 3.66  4.40  3.47   Sodium 135 - 145 mmol/L 137  133  134   Potassium 3.5 - 5.1 mmol/L 3.8  3.4  4.0   Chloride 98 - 111 mmol/L 102  98  102   CO2 22 - 32 mmol/L 23  25  23    Calcium 8.9 - 10.3 mg/dL 7.9  7.7  8.8       Physical Exam: Vasc:     Faintly palpable DP pulses bilaterally, nonpalpable PT pulses.  Cap refill time less than 3 seconds to digits.  Right lower extremity is edematous with erythema to forefoot   Derm:    Left lower extremity no open wounds or lesions appreciated.  Right foot lateral fifth metatarsal phalangeal joint ulceration measuring 3 x 2.5 x 1 cm, positive malodor, positive probe to bone. Scant purulent drainage present today. Medial first MTPJ ulceration with necrotic subcutaneous tissue measuring 4.5 x 2.5 x 1.5 cm, this overall appears stable.   MSK:     Manual  muscle strength testing 5/5 for all major muscle groups lower extremity   Neuro:   Light touch sensation diminished, Gross sensation intact.  Gross motor sensation intact    ASSESSMENT/PLAN OF CARE Right foot necrotic fifth MPJ ulceration with necrosis of bone, osteomyelitis Right foot first MPJ ulceration with necrosis of fat layer, evidence of osteomyelitis on MRI PAD  Plan: -Betadine wet-to-dry dressing applied today -Based on MRI findings suspect osteomyelitis of the first metatarsal head in addition to the fifth MPJ right foot -Vascular surgery is on board planning revascularization procedure to the right lower extremity last week.  Podiatry will continue to monitor in the interim -Podiatry planning for surgical intervention following revascularization -Continue IV antibiotics -Podiatry will continue to follow     Please contact me directly with any questions or concerns.     Bronwen Betters, DPM [facility]  Dr. Bronwen Betters, DPM    [address]  [place], [place] [address]                Office [phone]  Fax [phone]

## 2023-05-29 LAB — BASIC METABOLIC PANEL
Anion gap: 11 (ref 5–15)
BUN: 18 mg/dL (ref 6–20)
CO2: 21 mmol/L — ABNORMAL LOW (ref 22–32)
Calcium: 7.5 mg/dL — ABNORMAL LOW (ref 8.9–10.3)
Chloride: 105 mmol/L (ref 98–111)
Creatinine, Ser: 1.6 mg/dL — ABNORMAL HIGH (ref 0.61–1.24)
GFR, Estimated: 50 mL/min — ABNORMAL LOW (ref >60)
Glucose, Bld: 334 mg/dL — ABNORMAL HIGH (ref 70–99)
Potassium: 4.6 mmol/L (ref 3.5–5.1)
Sodium: 137 mmol/L (ref 135–145)

## 2023-05-29 LAB — GLUCOSE, CAPILLARY
Glucose-Capillary: 389 mg/dL — ABNORMAL HIGH (ref 70–99)
Glucose-Capillary: 314 mg/dL — ABNORMAL HIGH (ref 70–99)
Glucose-Capillary: 123 mg/dL — ABNORMAL HIGH (ref 70–99)
Glucose-Capillary: 224 mg/dL — ABNORMAL HIGH (ref 70–99)

## 2023-05-29 MED ORDER — INSULIN GLARGINE-YFGN 100 UNIT/ML ~~LOC~~ SOLN
50.0000 [IU] | Freq: Every day | SUBCUTANEOUS | Status: DC
  Administered 2023-05-29: 50 [IU] via SUBCUTANEOUS
  Filled 2023-05-29 (×2): qty 0.5

## 2023-05-29 MED ORDER — INSULIN GLARGINE-YFGN 100 UNIT/ML ~~LOC~~ SOLN
55.0000 [IU] | Freq: Every day | SUBCUTANEOUS | Status: DC
  Filled 2023-05-29: qty 0.55

## 2023-05-29 MED ORDER — FUROSEMIDE 40 MG PO TABS
40.0000 mg | ORAL_TABLET | Freq: Every day | ORAL | Status: DC
  Administered 2023-05-29 – 2023-05-31 (×3): 40 mg via ORAL
  Filled 2023-05-29 (×3): qty 1

## 2023-05-29 NOTE — Progress Notes (Signed)
Overview: Andres Richardson is a 57 y.o. person living with a history of T2DM, HTN, PAD, HFpEF who presented with right foot ulcers and admitted for osteomyelitis.  Overnight: No acute events   Subjective:   Feeling well. States that in the past endocrinology was too aggressive with his insulin and he was feeling the side effects of having his sugars go lower than what he was used to. He could not eat and was nauseous so he is nervous to go higher on insulin but willing to report side effects if needed so we can up-titrate his insulin     Objective:   Vital signs in last 24 hours: Vitals:   05/28/23 1922 05/29/23 0606 05/29/23 0822 05/29/23 1537  BP: (!) 128/56 (!) 148/63 (!) 144/59 (!) 137/54  Pulse: 92 82 85 87  Resp: 16 18 18 18   Temp: 98.2 F (36.8 C) 98.1 F (36.7 C) 98.9 F (37.2 C) 98.6 F (37 C)  TempSrc: Oral Oral Oral Oral  SpO2: 99% 98% 99% 98%  Weight:  91.5 kg    Height:       Supplemental O2: Room Air   SpO2: 98 % Filed Weights   05/27/23 0500 05/28/23 0500 05/29/23 0606  Weight: 89.6 kg 88.2 kg 91.5 kg    Intake/Output Summary (Last 24 hours) at 05/29/2023 1557 Last data filed at 05/29/2023 1500 Gross per 24 hour  Intake 868.9 ml  Output 3975 ml  Net -3106.1 ml   Net IO Since Admission: -2,978.77 mL [05/29/23 1557]  Physical Exam General: NAD, sitting comfortably in bed HENT: Atraumatic.Normocephalic  Lungs: Cleat to auscutation bilaterally  MSK: No acute changes, tender, edematous RLE up to the knee. No fluctuance or erythema. DP 2+ popliteal pulses diminished bilaterally. Both extremities are warm to palpation.  Neuro: alert and oriented x4 Psych: normal mood and normal affect  Diagnostics    Latest Ref Rng & Units 05/28/2023    7:11 AM 05/26/2023    8:25 PM 03/16/2023    4:36 PM  CBC  WBC 4.0 - 10.5 K/uL 6.8  8.2  9.0   Hemoglobin 13.0 - 17.0 g/dL 9.4  7.8  13.0   Hematocrit 39.0 - 52.0 % 28.3  23.7  33.8   Platelets 150 - 400 K/uL 246   268  240        Latest Ref Rng & Units 05/29/2023    6:31 AM 05/28/2023    7:11 AM 05/26/2023    8:25 PM  BMP  Glucose 70 - 99 mg/dL 865  784  696   BUN 6 - 20 mg/dL 18  16  17    Creatinine 0.61 - 1.24 mg/dL 2.95  2.84  1.32   Sodium 135 - 145 mmol/L 137  137  133   Potassium 3.5 - 5.1 mmol/L 4.6  3.8  3.4   Chloride 98 - 111 mmol/L 105  102  98   CO2 22 - 32 mmol/L 21  23  25    Calcium 8.9 - 10.3 mg/dL 7.5  7.9  7.7    Assessment/Plan: Principal Problem:   Osteomyelitis of right lower extremity (HCC) Active Problems:   CAD (coronary artery disease)   Colostomy in place [facility])   Essential hypertension   Anemia   Elevated serum creatinine   PAD (peripheral artery disease) (HCC)   Diabetes (HCC)   Aortic atherosclerosis (HCC)  Medial/lateral right foot ulcers c/b fifth toe osteomyelitis Chronic feet ulceration on the medial and lateral right foot  over calloused areas. Likely secondary to poor diabetic control and PAD reducing blood flow. Podiatry and vascular surgery are on board. Pt was started on Vanc and Zosyn. Blood cultures NGT. Ideally would like to obtain bone cultures. Pain regimen 1000mg  Tylenol and Oxycodone 5mg  PRN for breakthrough pain. Will continue with daily dressing changes. RLE is assymetrically more edematous that Left. Will do a doppler ultrasound although this is likely due to the osteomyelitis.   Hyperlipidemia PAD/CAD Goal for a person with DM is an LDL <70. Not currently at goal. Zetia added during this hospitalization. Continue with this. Plan for revascularization this coming week. Pending vascular recommendations on Monday. We appreciate Vascular's involvement with this case.   T2DM c/b neuropathy Pt states that side effects from tight glucose control recommended by endocrinology in the past was too stringent and made him sick. Sugars usually in the 300s for the past several years. Hgb A1C in 10.3 in August. He tried 50 units at home, and was nauseous and  could not tolerate PO intake, so he reduced it to about 44 units nightly. Could be that now he will need more insulin in the setting of his infection. Pt agreeable to increase his insulin to get more tighter control at this time. Will increase insulin to 55 units and continue and resistant SSI.   Normocytic Anemia Hgb is 7.8.  Iron panel most consistent with AICD. Pt is stable with no signs of an acute bleed. Hgb is higher today at 9.4 with an increased HCT, maybe in the setting of decreased PO intake.   HFpEF HTN Chronic. Patient has HFpEF and is on Lasix 40 mg daily.  Will continue that regimen here given he had history of flash pulmonary edema. For his HTN is amlodipine 5 mg every day. Will continue this.   CKDIIIa Baseline creatinine seems to be around ~1.1 although unclear given that other values were more likely in the setting of acute illness. Continue lasix as mentioned above. 1.6 today, improved from 1.8 yesterday. Continue encouraging PO intake. Continue to monitor with RFP  GERD Will continue pt's protonix 40 mg every day.   Diet: Carb-Modified IVF: None VTE: Enoxaparin Code: Full PT/OT recs: Pending after procedue Prior to Admission Living Arrangement: Home Anticipated Discharge Location: TBD/likely home Barriers to Discharge: Medical Stability Dispo: Anticipated discharge in approximately 3-4 day(s).   Gwenevere Abbot, MD [name]. [facility] Internal Medicine Residency, PGY-3  Pager: [phone] After 5 pm and on weekends: Please call the on-call pager

## 2023-05-29 NOTE — Progress Notes (Signed)
PODIATRY PROGRESS NOTE  NAME Andres Richardson MRN [id number] DOB November 27, 1965 DOA 05/26/2023   Reason for consult:  Chief Complaint  Patient presents with   Diabetic Ulcer     History of present illness: 57 y.o. male with heart failure, PAD, type 2 diabetes with right foot osteomyelitis.  Patient is seen resting comfortably bedside.  Some drainage appreciated to the dressing lateral aspect.  Patient denies pain.  He denies nausea, vomiting, fever, chills, chest pain, shortness of breath.  Vitals:   05/29/23 0606 05/29/23 0822  BP: (!) 148/63 (!) 144/59  Pulse: 82 85  Resp: 18 18  Temp: 98.1 F (36.7 C) 98.9 F (37.2 C)  SpO2: 98% 99%       Latest Ref Rng & Units 05/28/2023    7:11 AM 05/26/2023    8:25 PM 03/16/2023    4:36 PM  CBC  WBC 4.0 - 10.5 K/uL 6.8  8.2  9.0   Hemoglobin 13.0 - 17.0 g/dL 9.4  7.8  78.2   Hematocrit 39.0 - 52.0 % 28.3  23.7  33.8   Platelets 150 - 400 K/uL 246  268  240        Latest Ref Rng & Units 05/29/2023    6:31 AM 05/28/2023    7:11 AM 05/26/2023    8:25 PM  BMP  Glucose 70 - 99 mg/dL 956  213  086   BUN 6 - 20 mg/dL 18  16  17    Creatinine 0.61 - 1.24 mg/dL 5.78  4.69  6.29   Sodium 135 - 145 mmol/L 137  137  133   Potassium 3.5 - 5.1 mmol/L 4.6  3.8  3.4   Chloride 98 - 111 mmol/L 105  102  98   CO2 22 - 32 mmol/L 21  23  25    Calcium 8.9 - 10.3 mg/dL 7.5  7.9  7.7       Physical Exam: Vasc:     Faintly palpable DP pulses bilaterally, nonpalpable PT pulses.  Cap refill time less than 3 seconds to digits.  Right lower extremity is edematous with erythema to forefoot   Derm:    Left lower extremity no open wounds or lesions appreciated.  Right foot lateral fifth metatarsal phalangeal joint ulceration measuring 3 x 2.5 x 1 cm, positive malodor, positive probe to bone. Scant purulent drainage present today. Medial first MTPJ ulceration with necrotic subcutaneous tissue measuring 4.5 x 2.5 x 1.5 cm, this overall appears stable.   Overall appearance unchanged   MSK:     Manual muscle strength testing 5/5 for all major muscle groups lower extremity   Neuro:   Light touch sensation diminished, Gross sensation intact.  Gross motor sensation intact    ASSESSMENT/PLAN OF CARE Right foot necrotic fifth MPJ ulceration with necrosis of bone, osteomyelitis Right foot first MPJ ulceration with necrosis of fat layer, evidence of osteomyelitis on MRI PAD  Plan: -Betadine wet-to-dry dressing applied today -Based on MRI findings suspect osteomyelitis of the first metatarsal head in addition to the fifth MPJ right foot -Vascular surgery is on board planning revascularization procedure to the right lower extremity last week.  Podiatry will continue to monitor in the interim -Podiatry planning for surgical intervention following revascularization -Continue IV antibiotics -Podiatry will continue to follow     Please contact me directly with any questions or concerns.     Bronwen Betters, DPM [facility]  Dr. Bronwen Betters, DPM  [address] [place], [place] [address]                Office [phone]  Fax [phone]

## 2023-05-30 ENCOUNTER — Inpatient Hospital Stay (HOSPITAL_COMMUNITY): Payer: Medicare Other

## 2023-05-30 DIAGNOSIS — E11621 Type 2 diabetes mellitus with foot ulcer: Secondary | ICD-10-CM | POA: Diagnosis not present

## 2023-05-30 DIAGNOSIS — M7989 Other specified soft tissue disorders: Secondary | ICD-10-CM

## 2023-05-30 DIAGNOSIS — L97509 Non-pressure chronic ulcer of other part of unspecified foot with unspecified severity: Secondary | ICD-10-CM | POA: Diagnosis not present

## 2023-05-30 DIAGNOSIS — M869 Osteomyelitis, unspecified: Secondary | ICD-10-CM | POA: Diagnosis not present

## 2023-05-30 DIAGNOSIS — Z794 Long term (current) use of insulin: Secondary | ICD-10-CM | POA: Diagnosis not present

## 2023-05-30 LAB — RENAL FUNCTION PANEL
Albumin: 1.8 g/dL — ABNORMAL LOW (ref 3.5–5.0)
Anion gap: 10 (ref 5–15)
BUN: 15 mg/dL (ref 6–20)
CO2: 23 mmol/L (ref 22–32)
Calcium: 7.8 mg/dL — ABNORMAL LOW (ref 8.9–10.3)
Chloride: 106 mmol/L (ref 98–111)
Creatinine, Ser: 1.86 mg/dL — ABNORMAL HIGH (ref 0.61–1.24)
GFR, Estimated: 42 mL/min — ABNORMAL LOW (ref >60)
Glucose, Bld: 272 mg/dL — ABNORMAL HIGH (ref 70–99)
Phosphorus: 3.2 mg/dL (ref 2.5–4.6)
Potassium: 3.7 mmol/L (ref 3.5–5.1)
Sodium: 139 mmol/L (ref 135–145)

## 2023-05-30 LAB — CBC
HCT: 27.4 % — ABNORMAL LOW (ref 39.0–52.0)
Hemoglobin: 8.6 g/dL — ABNORMAL LOW (ref 13.0–17.0)
MCH: 26 pg (ref 26.0–34.0)
MCHC: 31.4 g/dL (ref 30.0–36.0)
MCV: 82.8 fL (ref 80.0–100.0)
Platelets: 248 10*3/uL (ref 150–400)
RBC: 3.31 MIL/uL — ABNORMAL LOW (ref 4.22–5.81)
RDW: 14.8 % (ref 11.5–15.5)
WBC: 5.3 10*3/uL (ref 4.0–10.5)
nRBC: 0 % (ref 0.0–0.2)

## 2023-05-30 LAB — GLUCOSE, CAPILLARY
Glucose-Capillary: 302 mg/dL — ABNORMAL HIGH (ref 70–99)
Glucose-Capillary: 274 mg/dL — ABNORMAL HIGH (ref 70–99)
Glucose-Capillary: 176 mg/dL — ABNORMAL HIGH (ref 70–99)
Glucose-Capillary: 253 mg/dL — ABNORMAL HIGH (ref 70–99)

## 2023-05-30 MED ORDER — SODIUM CHLORIDE 0.9 % IV SOLN
2.0000 g | Freq: Two times a day (BID) | INTRAVENOUS | Status: DC
  Administered 2023-05-30 – 2023-06-03 (×8): 2 g via INTRAVENOUS
  Filled 2023-05-30 (×8): qty 12.5

## 2023-05-30 MED ORDER — METRONIDAZOLE 500 MG/100ML IV SOLN
500.0000 mg | Freq: Two times a day (BID) | INTRAVENOUS | Status: DC
  Administered 2023-05-30 – 2023-06-03 (×8): 500 mg via INTRAVENOUS
  Filled 2023-05-30 (×9): qty 100

## 2023-05-30 MED ORDER — ASPIRIN 81 MG PO TBEC: 81.0000 mg | DELAYED_RELEASE_TABLET | Freq: Every day | ORAL | Status: DC

## 2023-05-30 MED ORDER — INSULIN GLARGINE-YFGN 100 UNIT/ML ~~LOC~~ SOLN
60.0000 [IU] | Freq: Every day | SUBCUTANEOUS | Status: DC
  Administered 2023-05-30: 60 [IU] via SUBCUTANEOUS
  Filled 2023-05-30 (×2): qty 0.6

## 2023-05-30 MED ORDER — CEFEPIME 1 G IM INJECTION
1.0000 g | Freq: Three times a day (TID) | INTRAMUSCULAR | Status: DC
  Filled 2023-05-30 (×2): qty 1

## 2023-05-30 MED ORDER — METRONIDAZOLE 500 MG/100ML IV SOLN: 500.0000 mg | Freq: Two times a day (BID) | INTRAVENOUS | Status: DC

## 2023-05-30 NOTE — Progress Notes (Signed)
Mobility Specialist: Progress Note   05/30/23 1536  Mobility  Activity Ambulated with assistance in hallway  Level of Assistance Contact guard assist, steadying assist  Assistive Device Front wheel walker  Distance Ambulated (ft) 100 ft  Activity Response Tolerated well  Mobility Referral Yes  $Mobility charge 1 Mobility  Mobility Specialist Start Time (ACUTE ONLY) [phone]  Mobility Specialist Stop Time (ACUTE ONLY) 0934  Mobility Specialist Time Calculation (min) (ACUTE ONLY) 17 min    Pt was agreeable to mobility session - received in bed. Had c/o R foot pain during ambulation or whenever pressure is applied. Able to relieve some pressure off foot by compensating with RW. SV for bed, CG for STS and ambulation. Returned to room without fault. Left in bed with all needs met, call bell in reach.   Maurene Capes Mobility Specialist Please contact via SecureChat or Rehab office at [phone]

## 2023-05-30 NOTE — Care Management Important Message (Signed)
Important Message  Patient Details  Name: Andres Richardson MRN: [id number] Date of Birth: 12-Sep-1965   Important Message Given:  Yes - Medicare IM     [name] 05/30/2023, 1:45 PM

## 2023-05-30 NOTE — Progress Notes (Signed)
  Daily Progress Note  Patient with right lower extremity critical limb ischemia with tissue loss  Subjective: No complaints  Objective: Vitals:   05/30/23 0442 05/30/23 0735  BP: (!) 150/66 (!) 123/58  Pulse: 80 79  Resp: 18 18  Temp: 97.9 F (36.6 C) 98.3 F (36.8 C)  SpO2: 97% 98%    Physical Examination With a palpable right femoral, nonpalpable left femoral, nonpalpable pedal pulses Right great toe with purulence Nonlabored breathing Regular rate Ostomy on the abdomen  ASSESSMENT/PLAN:  Patient is a 57 year old male with critical limb ischemia with tissue loss in the right great toe.  This has been followed in the outpatient setting, but has worsened dramatically necessitating inpatient admission.  CTA was reviewed demonstrating multilevel occlusive disease.  He would benefit from attempted endovascular intervention -likely iliac artery stenting, superficial femoral artery stenting.  After discussing the risks and benefits the above, Andres Richardson elected to proceed with right lower extremity angiography with possible intervention in an effort to improve distal perfusion for wound healing.  This is scheduled for Wednesday.  Please make n.p.o. Tuesday night.   [name] MD MS [facility] [phone] 05/30/2023  1:04 PM

## 2023-05-30 NOTE — Progress Notes (Signed)
PODIATRY PROGRESS NOTE  NAME Andres Richardson MRN [id number] DOB [birth year] DOA 05/26/2023   Reason for consult:  Chief Complaint  Patient presents with   Diabetic Ulcer     History of present illness: 57 y.o. male admitted for ulceration of the right foot.  MRI shows osteomyelitis.  Patient scheduled for vascular intervention this week.  Vitals:   05/30/23 0442 05/30/23 0735  BP: (!) 150/66 (!) 123/58  Pulse: 80 79  Resp: 18 18  Temp: 97.9 F (36.6 C) 98.3 F (36.8 C)  SpO2: 97% 98%       Latest Ref Rng & Units 05/30/2023    4:23 AM 05/28/2023    7:11 AM 05/26/2023    8:25 PM  CBC  WBC 4.0 - 10.5 K/uL 5.3  6.8  8.2   Hemoglobin 13.0 - 17.0 g/dL 8.6  9.4  7.8   Hematocrit 39.0 - 52.0 % 27.4  28.3  23.7   Platelets 150 - 400 K/uL 248  246  268        Latest Ref Rng & Units 05/30/2023    4:23 AM 05/29/2023    6:31 AM 05/28/2023    7:11 AM  BMP  Glucose 70 - 99 mg/dL 660  630  160   BUN 6 - 20 mg/dL 15  18  16    Creatinine 0.61 - 1.24 mg/dL 1.09  3.23  5.57   Sodium 135 - 145 mmol/L 139  137  137   Potassium 3.5 - 5.1 mmol/L 3.7  4.6  3.8   Chloride 98 - 111 mmol/L 106  105  102   CO2 22 - 32 mmol/L 23  21  23    Calcium 8.9 - 10.3 mg/dL 7.8  7.5  7.9       Physical Exam: General: AAOx3, NAD  Dermatology: Full-thickness ulcerations noted along the first, fifth MTPJ.  There is no crepitation today.  No fluctuation.  Edema erythema noted along the wounds.  Vascular: Unable to palpate pulse  Neurological: Decreased  Musculoskeletal Exam: No pain on exam    ASSESSMENT/PLAN OF CARE Osteomyelitis, ulcerations right foot, PAD  Again reviewed the MRI with the patient.  Discussed the conservative as well as surgical intervention.  Likely proceed with transmetatarsal amputation any vascular intervention.  For now continue broad-spectrum antibiotics.  Vancomycin, Zosyn.  Podiatry will continue to follow.      Please contact me directly with any questions  or concerns.     Ovid Curd, DPM [facility]  Dr. Lesia Sago. Veto Macqueen, DPM    [address] [place], [place] [address]                Office [phone]  Fax [phone]

## 2023-05-30 NOTE — Progress Notes (Addendum)
Overview: Andres Richardson is a 57 y.o. person living with a history of T2DM, HTN, PAD, HFpEF who presented with right foot ulcers and admitted for osteomyelitis.  Overnight: NAEO  Subjective: Slept well overnight. Did not feel jittery or nauseous.   Objective:   Vital signs in last 24 hours: Vitals:   05/29/23 0822 05/29/23 1537 05/29/23 1903 05/30/23 0442  BP: (!) 144/59 (!) 137/54 (!) 130/59 (!) 150/66  Pulse: 85 87 85 80  Resp: 18 18 17 18   Temp: 98.9 F (37.2 C) 98.6 F (37 C) 98.5 F (36.9 C) 97.9 F (36.6 C)  TempSrc: Oral Oral Oral Oral  SpO2: 99% 98% 97% 97%  Weight:      Height:       Supplemental O2: Room Air   SpO2: 97 % Filed Weights   05/27/23 0500 05/28/23 0500 05/29/23 0606  Weight: 89.6 kg 88.2 kg 91.5 kg    Intake/Output Summary (Last 24 hours) at 05/30/2023 0714 Last data filed at 05/29/2023 1736 Gross per 24 hour  Intake 628.9 ml  Output 2400 ml  Net -1771.1 ml   Net IO Since Admission: -3,178.77 mL [05/30/23 0714]  Physical Exam General: NAD, sitting comfortably in bed HENT: Atraumatic.Normocephalic  Lungs: Cleat to auscutation bilaterally  MSK: No acute changes, tenderness to palpation on the LE is dimineshed compared to last time, edematous RLE up to the knee, diminished from yesterday. No fluctuance or erythema. No crepitus. Wounds on the medical and lateral right foot covered with pseudomembraneous changes and granulation tissue. DP 2+ popliteal pulses diminished bilaterally. Both extremities are warm to palpation.  Neuro: alert and oriented x4 Psych: normal mood and normal affect  Diagnostics    Latest Ref Rng & Units 05/30/2023    4:23 AM 05/28/2023    7:11 AM 05/26/2023    8:25 PM  CBC  WBC 4.0 - 10.5 K/uL 5.3  6.8  8.2   Hemoglobin 13.0 - 17.0 g/dL 8.6  9.4  7.8   Hematocrit 39.0 - 52.0 % 27.4  28.3  23.7   Platelets 150 - 400 K/uL 248  246  268        Latest Ref Rng & Units 05/30/2023    4:23 AM 05/29/2023    6:31 AM  05/28/2023    7:11 AM  BMP  Glucose 70 - 99 mg/dL 784  696  295   BUN 6 - 20 mg/dL 15  18  16    Creatinine 0.61 - 1.24 mg/dL 2.84  1.32  4.40   Sodium 135 - 145 mmol/L 139  137  137   Potassium 3.5 - 5.1 mmol/L 3.7  4.6  3.8   Chloride 98 - 111 mmol/L 106  105  102   CO2 22 - 32 mmol/L 23  21  23    Calcium 8.9 - 10.3 mg/dL 7.8  7.5  7.9    Assessment/Plan: Principal Problem:   Osteomyelitis of right lower extremity (HCC) Active Problems:   CAD (coronary artery disease)   Colostomy in place [facility])   Essential hypertension   Anemia   Elevated serum creatinine   PAD (peripheral artery disease) (HCC)   Diabetes (HCC)   Aortic atherosclerosis (HCC)  Medial/lateral right foot ulcers c/b fifth toe and first MP osteomyelitis Appreciate vascular and podiatry's involvement with this case. He will likely need iliac artery stenting as well as superficial femoral artery stenting per vascular. Pt will need right lower extremity angiography which is pending. May need amputation per  Podiatry.  Appreciate pharmacy with assistance in determining antibiotic regimen. Will switch Zosyn due to worsening renal function. Will continue with Cefepime 2g q12 hours, Flagyl 500mg  q12 hours, and Vancomycin 1,500mg  IV daily.   Hyperlipidemia PAD/CAD Plan for revascularization as above. Goal for a person with DM is an LDL <70. Not currently at goal. Cw rosuvastatin 40mg  daily and zetia 10mg  daily.   T2DM c/b neuropathy Remains in the 200s today after 50 units. Will increase to 60 units nightly.   Normocytic Anemia AICD Hgb is stable at 8.6 today. Continue monitoring with CBCs  HFpEF HTN Chronic. Patient has HFpEF and is on Lasix 40 mg daily.  Will continue that regimen here given he had history of flash pulmonary edema. For his HTN is amlodipine 5 mg every day. Will continue this.   CKDIIIa Baseline creatinine seems to be around ~1.4-1.6. Continue lasix as mentioned above. 1.8 today, could be due to Vanc  and Zosyn. Continue encouraging PO intake. Continue to monitor with RFP. Switching antibiotics as above.  GERD Will continue pt's protonix 40 mg every day.   Diet: Carb-Modified IVF: None VTE: Enoxaparin Code: Full PT/OT recs: Pending after procedue Prior to Admission Living Arrangement: Home Anticipated Discharge Location: TBD/likely home Barriers to Discharge: Medical Stability Dispo: Anticipated discharge in approximately 3-4 day(s).

## 2023-05-30 NOTE — Progress Notes (Signed)
       Patient followed by Dr. Lenell Antu Plan to review and discusses surgical intervention today with Dr. Tommie Ard following as well  A/P PAD B LE with tissue loss type 2 diabetes with right foot osteomyelitis.   Pending surgical  revascularization followed by DPM    Mosetta Pigeon PA-C

## 2023-05-30 NOTE — Plan of Care (Signed)

## 2023-05-30 NOTE — Progress Notes (Signed)
Lower extremity venous duplex completed. Please see CV Procedures for preliminary results.  Shona Simpson, RVT 05/30/23 11:29 AM

## 2023-05-31 DIAGNOSIS — L97509 Non-pressure chronic ulcer of other part of unspecified foot with unspecified severity: Secondary | ICD-10-CM | POA: Diagnosis not present

## 2023-05-31 DIAGNOSIS — E11621 Type 2 diabetes mellitus with foot ulcer: Secondary | ICD-10-CM | POA: Diagnosis not present

## 2023-05-31 DIAGNOSIS — Z794 Long term (current) use of insulin: Secondary | ICD-10-CM | POA: Diagnosis not present

## 2023-05-31 DIAGNOSIS — M869 Osteomyelitis, unspecified: Secondary | ICD-10-CM | POA: Diagnosis not present

## 2023-05-31 LAB — CBC WITH DIFFERENTIAL/PLATELET
Abs Immature Granulocytes: 0.05 10*3/uL (ref 0.00–0.07)
Basophils Absolute: 0.1 10*3/uL (ref 0.0–0.1)
Basophils Relative: 1 %
Eosinophils Absolute: 0.3 10*3/uL (ref 0.0–0.5)
Eosinophils Relative: 5 %
HCT: 26.2 % — ABNORMAL LOW (ref 39.0–52.0)
Hemoglobin: 8.6 g/dL — ABNORMAL LOW (ref 13.0–17.0)
Immature Granulocytes: 1 %
Lymphocytes Relative: 21 %
Lymphs Abs: 1 10*3/uL (ref 0.7–4.0)
MCH: 26.6 pg (ref 26.0–34.0)
MCHC: 32.8 g/dL (ref 30.0–36.0)
MCV: 81.1 fL (ref 80.0–100.0)
Monocytes Absolute: 0.4 10*3/uL (ref 0.1–1.0)
Monocytes Relative: 8 %
Neutro Abs: 3.3 10*3/uL (ref 1.7–7.7)
Neutrophils Relative %: 64 %
Platelets: 249 10*3/uL (ref 150–400)
RBC: 3.23 MIL/uL — ABNORMAL LOW (ref 4.22–5.81)
RDW: 14.6 % (ref 11.5–15.5)
WBC: 5.1 10*3/uL (ref 4.0–10.5)
nRBC: 0 % (ref 0.0–0.2)

## 2023-05-31 LAB — BASIC METABOLIC PANEL
Anion gap: 7 (ref 5–15)
BUN: 18 mg/dL (ref 6–20)
CO2: 24 mmol/L (ref 22–32)
Calcium: 8.1 mg/dL — ABNORMAL LOW (ref 8.9–10.3)
Chloride: 104 mmol/L (ref 98–111)
Creatinine, Ser: 1.38 mg/dL — ABNORMAL HIGH (ref 0.61–1.24)
GFR, Estimated: 60 mL/min — ABNORMAL LOW (ref >60)
Glucose, Bld: 247 mg/dL — ABNORMAL HIGH (ref 70–99)
Potassium: 3.8 mmol/L (ref 3.5–5.1)
Sodium: 135 mmol/L (ref 135–145)

## 2023-05-31 LAB — CULTURE, BLOOD (ROUTINE X 2)
Culture: NO GROWTH
Culture: NO GROWTH

## 2023-05-31 LAB — GLUCOSE, CAPILLARY
Glucose-Capillary: 252 mg/dL — ABNORMAL HIGH (ref 70–99)
Glucose-Capillary: 201 mg/dL — ABNORMAL HIGH (ref 70–99)
Glucose-Capillary: 342 mg/dL — ABNORMAL HIGH (ref 70–99)
Glucose-Capillary: 258 mg/dL — ABNORMAL HIGH (ref 70–99)

## 2023-05-31 LAB — VANCOMYCIN, PEAK: Vancomycin Pk: 39 ug/mL (ref 30–40)

## 2023-05-31 MED ORDER — HYDROXYZINE HCL 25 MG PO TABS: 25.0000 mg | ORAL_TABLET | Freq: Once | ORAL | Status: DC

## 2023-05-31 MED ORDER — INSULIN GLARGINE-YFGN 100 UNIT/ML ~~LOC~~ SOLN
45.0000 [IU] | Freq: Every day | SUBCUTANEOUS | Status: DC
  Administered 2023-05-31 – 2023-06-01 (×2): 45 [IU] via SUBCUTANEOUS
  Filled 2023-05-31 (×2): qty 0.45

## 2023-05-31 MED ORDER — INSULIN GLARGINE-YFGN 100 UNIT/ML ~~LOC~~ SOLN
30.0000 [IU] | Freq: Every day | SUBCUTANEOUS | Status: DC
  Filled 2023-05-31: qty 0.3

## 2023-05-31 MED ORDER — HYDROXYZINE HCL 25 MG PO TABS
25.0000 mg | ORAL_TABLET | Freq: Every evening | ORAL | Status: DC | PRN
  Administered 2023-05-31: 25 mg via ORAL
  Filled 2023-05-31: qty 1

## 2023-05-31 MED ORDER — ASPIRIN 81 MG PO TBEC
81.0000 mg | DELAYED_RELEASE_TABLET | Freq: Every day | ORAL | Status: DC
  Administered 2023-05-31 – 2023-06-06 (×6): 81 mg via ORAL
  Filled 2023-05-31 (×7): qty 1

## 2023-05-31 MED ORDER — CETAPHIL MOISTURIZING EX LOTN
TOPICAL_LOTION | Freq: Every day | CUTANEOUS | Status: DC
  Filled 2023-05-31 (×2): qty 473

## 2023-05-31 NOTE — Plan of Care (Signed)

## 2023-05-31 NOTE — Progress Notes (Addendum)
Overview: Andres Richardson is a 57 y.o. person living with a history of T2DM, HTN, PAD, HFpEF who presented with right foot ulcers and admitted for osteomyelitis.  Overnight: NAEO  Subjective: Is anxious about procedure and recovery period.   Objective:   Vital signs in last 24 hours: Vitals:   05/30/23 1357 05/30/23 2120 05/31/23 0406 05/31/23 0743  BP: (!) 154/59 (!) 159/65 (!) 147/62 (!) 155/71  Pulse: 89 78 76 87  Resp: 17 16  16   Temp: 98.5 F (36.9 C) 98.4 F (36.9 C) 98 F (36.7 C) 98.2 F (36.8 C)  TempSrc: Oral Oral Oral Oral  SpO2: 97% 97% 98% 100%  Weight:      Height:       Supplemental O2: Room Air Last BM Date : 05/30/23 SpO2: 100 % Filed Weights   05/27/23 0500 05/28/23 0500 05/29/23 0606  Weight: 89.6 kg 88.2 kg 91.5 kg    Intake/Output Summary (Last 24 hours) at 05/31/2023 0851 Last data filed at 05/31/2023 0505 Gross per 24 hour  Intake 1330 ml  Output 4075 ml  Net -2745 ml   Net IO Since Admission: -6,323.77 mL [05/31/23 0851]  Physical Exam General: NAD, laying comfortably in bed HENT: Atraumatic.Normocephalic  Lungs: Clear to auscutation bilaterally  MSK: No acute changes, tenderness to palpation on the LE is gone today, edematous RLE up to the knee, much improved. No fluctuance or erythema. No crepitus. Wounds on the medical and lateral right foot covered with dressings today. DP diminished bilaterally. Both extremities are warm to palpation.  Neuro: alert and oriented x4 Psych: normal mood and normal affect  Diagnostics    Latest Ref Rng & Units 05/31/2023    6:24 AM 05/30/2023    4:23 AM 05/28/2023    7:11 AM  CBC  WBC 4.0 - 10.5 K/uL 5.1  5.3  6.8   Hemoglobin 13.0 - 17.0 g/dL 8.6  8.6  9.4   Hematocrit 39.0 - 52.0 % 26.2  27.4  28.3   Platelets 150 - 400 K/uL 249  248  246        Latest Ref Rng & Units 05/31/2023    6:24 AM 05/30/2023    4:23 AM 05/29/2023    6:31 AM  BMP  Glucose 70 - 99 mg/dL 161  096  045   BUN 6 -  20 mg/dL 18  15  18    Creatinine 0.61 - 1.24 mg/dL 4.09  8.11  9.14   Sodium 135 - 145 mmol/L 135  139  137   Potassium 3.5 - 5.1 mmol/L 3.8  3.7  4.6   Chloride 98 - 111 mmol/L 104  106  105   CO2 22 - 32 mmol/L 24  23  21    Calcium 8.9 - 10.3 mg/dL 8.1  7.8  7.5    Assessment/Plan: Principal Problem:   Osteomyelitis of right lower extremity (HCC) Active Problems:   CAD (coronary artery disease)   Colostomy in place [facility])   Essential hypertension   Anemia   Elevated serum creatinine   PAD (peripheral artery disease) (HCC)   Diabetes (HCC)   Aortic atherosclerosis (HCC)  Medial/lateral right foot ulcers c/b fifth toe and first MP osteomyelitis Appreciate vascular and podiatry's involvement with this case. NPO at midnight today for angiography tomorrow.  Cw Cefepime 2g q12 hours, Flagyl 500mg  q12 hours, and Vancomycin 1,500mg  IV daily.  Pt is on ASA, ok per vascular and podiatry he is to remain on ASA.  Hyperlipidemia PAD/CAD Plan for revascularization as above. Last LDL at 60 03/2023. Cw rosuvastatin 40mg  daily and Zetia 10mg  daily.   T2DM c/b neuropathy Remains in the 200s today after 60 units. He will be NPO at midnight. Decrease dose by 25% tonight.  Normocytic Anemia AICD Hgb is stable at 8.6 today. Continue monitoring with CBCs  HFpEF HTN Chronic. Patient has HFpEF and is on Lasix 40 mg daily.  Will continue that regimen here given he had history of flash pulmonary edema. For his HTN is amlodipine 5 mg every day. Will continue this.   CKDIIIa Baseline creatinine seems to be around ~1.1 (when not in the setting of infection or sickness).  Continue lasix as mentioned above. Dc'ed vanc and Zosyn yesterday and now on Cefepime, metronidazole, and vanc. Continue encouraging PO intake today prior to him being NPO tomorrow. Continue to monitor with RFP.   GERD Will continue pt's protonix 40 mg every day.   Anxiety  Due to procedure tomorrow and recovery ahead. Did not sleep  last night. Has previously tried melatonin and it was not effective for him. -Hydroxyzine 25mg  at bedtime prn  Diet: Carb-Modified IVF: None VTE: Enoxaparin Code: Full PT/OT recs: Pending after procedue Prior to Admission Living Arrangement: Home Anticipated Discharge Location: TBD/likely home Barriers to Discharge: Medical Stability Dispo: Anticipated discharge in approximately 3-4 day(s).

## 2023-05-31 NOTE — Progress Notes (Signed)
PODIATRY PROGRESS NOTE  NAME Andres Richardson MRN [id number] DOB 11/10/1965 DOA 05/26/2023   Reason for consult:  Chief Complaint  Patient presents with   Diabetic Ulcer     History of present illness: 57 y.o. male who is followed by podiatry for right foot osteomyelitis of fifth metatarsal, first metatarsal.  Patient seen resting comfortably at bedside.  Denies any significant pain.  Denies nausea, vomiting, fever, chills, chest pain, shortness of breath.  Vascular surgery planning angiogram with possible intervention on 10/16 with Dr. Karin Lieu.  Vitals:   05/31/23 0406 05/31/23 0743  BP: (!) 147/62 (!) 155/71  Pulse: 76 87  Resp:  16  Temp: 98 F (36.7 C) 98.2 F (36.8 C)  SpO2: 98% 100%       Latest Ref Rng & Units 05/31/2023    6:24 AM 05/30/2023    4:23 AM 05/28/2023    7:11 AM  CBC  WBC 4.0 - 10.5 K/uL 5.1  5.3  6.8   Hemoglobin 13.0 - 17.0 g/dL 8.6  8.6  9.4   Hematocrit 39.0 - 52.0 % 26.2  27.4  28.3   Platelets 150 - 400 K/uL 249  248  246        Latest Ref Rng & Units 05/31/2023    6:24 AM 05/30/2023    4:23 AM 05/29/2023    6:31 AM  BMP  Glucose 70 - 99 mg/dL 409  811  914   BUN 6 - 20 mg/dL 18  15  18    Creatinine 0.61 - 1.24 mg/dL 7.82  9.56  2.13   Sodium 135 - 145 mmol/L 135  139  137   Potassium 3.5 - 5.1 mmol/L 3.8  3.7  4.6   Chloride 98 - 111 mmol/L 104  106  105   CO2 22 - 32 mmol/L 24  23  21    Calcium 8.9 - 10.3 mg/dL 8.1  7.8  7.5       Physical Exam: General: AOx3, no acute distress  Dermatology: Dressings left intact today.  No ascending erythema or streaking lymphangitis.  Vascular: DP and PT pulses nonpalpable  Neurological: Decreased  Musculoskeletal Exam: No pain on exam    ASSESSMENT/PLAN OF CARE Osteomyelitis with associated ulcerations to the right fifth MPJ, first MPJ PAD  Plan: -Again discussed conservative versus surgical intervention with the patient.  Patient understands and is agreeable to surgical  intervention following vascular intervention, likely transmetatarsal amputation -Continue broad-spectrum antibiotics for now until surgical cultures and pathology are obtained -Undergoing angiogram with possible revascularization on 06/01/2023.     Please contact me directly with any questions or concerns.     Bronwen Betters, DPM [facility]  Dr. Bronwen Betters, DPM    [address] [place], [place] [address]                Office [phone]  Fax [phone]

## 2023-05-31 NOTE — Progress Notes (Addendum)
[facility] [place]  Subjective  - No new complaints.   Objective (!) 147/62 76 98 F (36.7 C) (Oral) 16 98%  Intake/Output Summary (Last 24 hours) at 05/31/2023 0723 Last data filed at 05/31/2023 0505 Gross per 24 hour  Intake 1330 ml  Output 4475 ml  Net -3145 ml    Right foot dressing clean and dry Lungs non labored breathing  Assessment/Planning: Right CLI with tissue loss  NPO past MN for angiogram 06/01/23 with DR. Hurshel Bouillon Patient agrees to proceed  Mosetta Pigeon 05/31/2023 7:23 AM --  VASCULAR STAFF ADDENDUM: I have independently interviewed and examined the patient. I agree with the above.  Cath lab tomorrow   [name] MD [facility] Phone Number: [phone] 05/31/2023 11:24 AM   Laboratory Lab Results: Recent Labs    05/30/23 0423 05/31/23 0624  WBC 5.3 5.1  HGB 8.6* 8.6*  HCT 27.4* 26.2*  PLT 248 249   BMET Recent Labs    05/30/23 0423 05/31/23 0624  NA 139 135  K 3.7 3.8  CL 106 104  CO2 23 24  GLUCOSE 272* 247*  BUN 15 18  CREATININE 1.86* 1.38*  CALCIUM 7.8* 8.1*    COAG Lab Results  Component Value Date   INR 1.1 05/10/2021   No results found for: "PTT"

## 2023-05-31 NOTE — Progress Notes (Signed)
Mobility Specialist: Progress Note   05/31/23 1558  Mobility  Activity Ambulated with assistance in hallway  Level of Assistance Contact guard assist, steadying assist  Assistive Device Front wheel walker  Distance Ambulated (ft) 300 ft  Activity Response Tolerated well  Mobility Referral Yes  $Mobility charge 1 Mobility  Mobility Specialist Start Time (ACUTE ONLY) 1407  Mobility Specialist Stop Time (ACUTE ONLY) 1421  Mobility Specialist Time Calculation (min) (ACUTE ONLY) 14 min    Pt was agreeable to mobility session - received in bed. ModI for bed mobility, SV for STS, CG for ambulation d/t unsteadiness a couple times. MS assisted with don/doff ortho shoe. Had c/o R foot pain that increased with ambulation/pressure. Stated WB through heel helped a little. Returned to room without fault. Left in bed with all needs met, call bell in reach.   Maurene Capes Mobility Specialist Please contact via SecureChat or Rehab office at [phone]

## 2023-05-31 NOTE — Progress Notes (Signed)
Pharmacy Antibiotic Note  Andres Richardson is a 57 y.o. male admitted on 05/26/2023 with Osteomyelitis with associated ulcerations to the right fifth MPJ, first MPJ .  Pharmacy has been consulted for vancomycin dosing and subsequent monitoring.  Plan: Will collect timed collection of a vancomycin peak around today's scheduled 1300h dose as follows: Administer vancomycin dose and infuse over 2h. 1300-->1500. 1h after end of infusion, I.e. 1600h, timed-collection of vancomycin peak. Tomorrow, 16-OCT-24, approximately 1h before next dose will have timed-collection of vancomycin trought at 1200h. Next dose tomorrow will be at 1300h.   Height: 5\' 11"  (180.3 cm) Weight: 91.5 kg (201 lb 11.5 oz) IBW/kg (Calculated) : 75.3  Temp (24hrs), Avg:98.3 F (36.8 C), Min:98 F (36.7 C), Max:98.5 F (36.9 C)  Recent Labs  Lab 05/26/23 2025 05/26/23 2036 05/28/23 0711 05/29/23 0631 05/30/23 0423 05/31/23 0624  WBC 8.2  --  6.8  --  5.3 5.1  CREATININE 1.43*  --  1.81* 1.60* 1.86* 1.38*  LATICACIDVEN  --  0.9  --   --   --   --     Estimated Creatinine Clearance: 68.3 mL/min (A) (by C-G formula based on SCr of 1.38 mg/dL (H)).    Allergies  Allergen Reactions   Adhesive [Tape] Rash     Thank you for allowing pharmacy to be a part of this patient's care.  Elicia Lamp, PharmD, CPP 05/31/2023 12:14 PM

## 2023-06-01 ENCOUNTER — Encounter (HOSPITAL_COMMUNITY)
Admission: EM | Disposition: A | Discharge status: Home Health | Payer: Self-pay | Source: Home / Self Care | Attending: Internal Medicine

## 2023-06-01 ENCOUNTER — Ambulatory Visit: Payer: Medicare Other | Admitting: "Endocrinology

## 2023-06-01 DIAGNOSIS — M86171 Other acute osteomyelitis, right ankle and foot: Secondary | ICD-10-CM

## 2023-06-01 DIAGNOSIS — M869 Osteomyelitis, unspecified: Secondary | ICD-10-CM | POA: Diagnosis not present

## 2023-06-01 DIAGNOSIS — I70235 Atherosclerosis of native arteries of right leg with ulceration of other part of foot: Secondary | ICD-10-CM

## 2023-06-01 HISTORY — PX: PERIPHERAL VASCULAR INTERVENTION: CATH118257

## 2023-06-01 HISTORY — PX: LOWER EXTREMITY ANGIOGRAPHY: CATH118251

## 2023-06-01 LAB — CBC
HCT: 26.2 % — ABNORMAL LOW (ref 39.0–52.0)
Hemoglobin: 8.4 g/dL — ABNORMAL LOW (ref 13.0–17.0)
MCH: 26.3 pg (ref 26.0–34.0)
MCHC: 32.1 g/dL (ref 30.0–36.0)
MCV: 81.9 fL (ref 80.0–100.0)
Platelets: 245 10*3/uL (ref 150–400)
RBC: 3.2 MIL/uL — ABNORMAL LOW (ref 4.22–5.81)
RDW: 14.9 % (ref 11.5–15.5)
WBC: 5.6 10*3/uL (ref 4.0–10.5)
nRBC: 0 % (ref 0.0–0.2)

## 2023-06-01 LAB — SURGICAL PCR SCREEN
MRSA, PCR: NEGATIVE
Staphylococcus aureus: NEGATIVE

## 2023-06-01 LAB — RENAL FUNCTION PANEL
Albumin: 1.9 g/dL — ABNORMAL LOW (ref 3.5–5.0)
Anion gap: 10 (ref 5–15)
BUN: 27 mg/dL — ABNORMAL HIGH (ref 6–20)
CO2: 20 mmol/L — ABNORMAL LOW (ref 22–32)
Calcium: 7.9 mg/dL — ABNORMAL LOW (ref 8.9–10.3)
Chloride: 106 mmol/L (ref 98–111)
Creatinine, Ser: 1.8 mg/dL — ABNORMAL HIGH (ref 0.61–1.24)
GFR, Estimated: 43 mL/min — ABNORMAL LOW (ref >60)
Glucose, Bld: 303 mg/dL — ABNORMAL HIGH (ref 70–99)
Phosphorus: 3.4 mg/dL (ref 2.5–4.6)
Potassium: 3.8 mmol/L (ref 3.5–5.1)
Sodium: 136 mmol/L (ref 135–145)

## 2023-06-01 LAB — VANCOMYCIN, RANDOM: Vancomycin Rm: 15 ug/mL

## 2023-06-01 LAB — POCT ACTIVATED CLOTTING TIME: Activated Clotting Time: 232 s

## 2023-06-01 LAB — GLUCOSE, CAPILLARY
Glucose-Capillary: 279 mg/dL — ABNORMAL HIGH (ref 70–99)
Glucose-Capillary: 173 mg/dL — ABNORMAL HIGH (ref 70–99)
Glucose-Capillary: 255 mg/dL — ABNORMAL HIGH (ref 70–99)
Glucose-Capillary: 280 mg/dL — ABNORMAL HIGH (ref 70–99)

## 2023-06-01 SURGERY — LOWER EXTREMITY ANGIOGRAPHY
Anesthesia: LOCAL | Laterality: Right

## 2023-06-01 MED ORDER — LABETALOL HCL 5 MG/ML IV SOLN
10.0000 mg | INTRAVENOUS | Status: DC | PRN
  Administered 2023-06-02: 10 mg via INTRAVENOUS
  Filled 2023-06-01: qty 4

## 2023-06-01 MED ORDER — LABETALOL HCL 5 MG/ML IV SOLN
INTRAVENOUS | Status: DC | PRN
  Administered 2023-06-01 (×2): 10 mg via INTRAVENOUS

## 2023-06-01 MED ORDER — NITROGLYCERIN 1 MG/10 ML FOR IR/CATH LAB
INTRA_ARTERIAL | Status: DC | PRN
  Administered 2023-06-01: 300 ug via INTRA_ARTERIAL

## 2023-06-01 MED ORDER — FENTANYL CITRATE (PF) 100 MCG/2ML IJ SOLN
INTRAMUSCULAR | Status: DC | PRN
  Administered 2023-06-01: 50 ug via INTRAVENOUS

## 2023-06-01 MED ORDER — CLOPIDOGREL BISULFATE 75 MG PO TABS
75.0000 mg | ORAL_TABLET | Freq: Every day | ORAL | Status: DC
  Administered 2023-06-01 – 2023-06-06 (×6): 75 mg via ORAL
  Filled 2023-06-01 (×6): qty 1

## 2023-06-01 MED ORDER — VANCOMYCIN VARIABLE DOSE PER UNSTABLE RENAL FUNCTION (PHARMACIST DOSING): Status: DC

## 2023-06-01 MED ORDER — VANCOMYCIN HCL 1750 MG/350ML IV SOLN
1750.0000 mg | INTRAVENOUS | Status: DC
  Administered 2023-06-01 – 2023-06-03 (×2): 1750 mg via INTRAVENOUS
  Filled 2023-06-01 (×2): qty 350

## 2023-06-01 MED ORDER — MIDAZOLAM HCL 2 MG/2ML IJ SOLN
INTRAMUSCULAR | Status: DC | PRN
  Administered 2023-06-01: 1 mg via INTRAVENOUS

## 2023-06-01 MED ORDER — HEPARIN SODIUM (PORCINE) 1000 UNIT/ML IJ SOLN
INTRAMUSCULAR | Status: DC | PRN
  Administered 2023-06-01: 5000 [IU] via INTRAVENOUS
  Administered 2023-06-01: 4000 [IU] via INTRAVENOUS

## 2023-06-01 MED ORDER — IODIXANOL 320 MG/ML IV SOLN
INTRAVENOUS | Status: DC | PRN
  Administered 2023-06-01: 70 mL

## 2023-06-01 MED ORDER — ACETAMINOPHEN 325 MG PO TABS: 650.0000 mg | ORAL_TABLET | ORAL | Status: DC | PRN

## 2023-06-01 MED ORDER — PROTAMINE SULFATE 10 MG/ML IV SOLN
INTRAVENOUS | Status: AC
  Filled 2023-06-01: qty 5

## 2023-06-01 MED ORDER — ASPIRIN 81 MG PO CHEW
CHEWABLE_TABLET | ORAL | Status: DC | PRN
  Administered 2023-06-01: 81 mg via ORAL

## 2023-06-01 MED ORDER — SODIUM CHLORIDE 0.9% FLUSH
3.0000 mL | Freq: Two times a day (BID) | INTRAVENOUS | Status: DC
  Administered 2023-06-01 – 2023-06-06 (×6): 3 mL via INTRAVENOUS

## 2023-06-01 MED ORDER — SODIUM CHLORIDE 0.9% FLUSH: 3.0000 mL | INTRAVENOUS | Status: DC | PRN

## 2023-06-01 MED ORDER — HYDRALAZINE HCL 20 MG/ML IJ SOLN
INTRAMUSCULAR | Status: DC | PRN
  Administered 2023-06-01: 10 mg via INTRAVENOUS

## 2023-06-01 MED ORDER — SODIUM CHLORIDE 0.9 % IV SOLN: 250.0000 mL | INTRAVENOUS | Status: DC | PRN

## 2023-06-01 MED ORDER — SODIUM CHLORIDE 0.9 % WEIGHT BASED INFUSION: 1.0000 mL/kg/h | INTRAVENOUS | Status: AC

## 2023-06-01 MED ORDER — NITROGLYCERIN 1 MG/10 ML FOR IR/CATH LAB
INTRA_ARTERIAL | Status: DC | PRN
  Administered 2023-06-01: 4 mL via INTRA_ARTERIAL

## 2023-06-01 MED ORDER — ONDANSETRON HCL 4 MG/2ML IJ SOLN: 4.0000 mg | Freq: Four times a day (QID) | INTRAMUSCULAR | Status: DC | PRN

## 2023-06-01 MED ORDER — LABETALOL HCL 5 MG/ML IV SOLN
INTRAVENOUS | Status: AC
  Filled 2023-06-01: qty 4

## 2023-06-01 MED ORDER — NITROGLYCERIN 1 MG/10 ML FOR IR/CATH LAB
INTRA_ARTERIAL | Status: AC
  Filled 2023-06-01: qty 10

## 2023-06-01 MED ORDER — HYDRALAZINE HCL 20 MG/ML IJ SOLN: 5.0000 mg | INTRAMUSCULAR | Status: DC | PRN

## 2023-06-01 MED ORDER — CHLORHEXIDINE GLUCONATE CLOTH 2 % EX PADS
6.0000 | MEDICATED_PAD | Freq: Once | CUTANEOUS | Status: AC
  Administered 2023-06-01: 6 via TOPICAL

## 2023-06-01 MED ORDER — CHLORHEXIDINE GLUCONATE CLOTH 2 % EX PADS
6.0000 | MEDICATED_PAD | Freq: Once | CUTANEOUS | Status: AC
  Administered 2023-06-02: 6 via TOPICAL

## 2023-06-01 MED ORDER — LIDOCAINE HCL (PF) 1 % IJ SOLN
INTRAMUSCULAR | Status: DC | PRN
  Administered 2023-06-01: 10 mL

## 2023-06-01 MED ORDER — SODIUM CHLORIDE 0.9 % IV SOLN: INTRAVENOUS | Status: AC

## 2023-06-01 MED ORDER — MIDAZOLAM HCL 2 MG/2ML IJ SOLN
INTRAMUSCULAR | Status: AC
  Filled 2023-06-01: qty 2

## 2023-06-01 MED ORDER — ASPIRIN 81 MG PO CHEW
CHEWABLE_TABLET | ORAL | Status: AC
  Filled 2023-06-01: qty 1

## 2023-06-01 MED ORDER — HEPARIN (PORCINE) IN NACL 1000-0.9 UT/500ML-% IV SOLN
INTRAVENOUS | Status: DC | PRN
  Administered 2023-06-01: 1000 mL

## 2023-06-01 MED ORDER — LIDOCAINE HCL (PF) 1 % IJ SOLN
INTRAMUSCULAR | Status: AC
  Filled 2023-06-01: qty 30

## 2023-06-01 MED ORDER — HEPARIN SODIUM (PORCINE) 5000 UNIT/ML IJ SOLN: 5000.0000 [IU] | Freq: Three times a day (TID) | INTRAMUSCULAR | Status: DC

## 2023-06-01 MED ORDER — PROTAMINE SULFATE 10 MG/ML IV SOLN
INTRAVENOUS | Status: DC | PRN
  Administered 2023-06-01: 25 mg via INTRAVENOUS
  Administered 2023-06-01: 5 mg via INTRAVENOUS

## 2023-06-01 MED ORDER — FENTANYL CITRATE (PF) 100 MCG/2ML IJ SOLN
INTRAMUSCULAR | Status: AC
  Filled 2023-06-01: qty 2

## 2023-06-01 SURGICAL SUPPLY — 17 items
BALLN IN.PACT DCB 5X60 (BALLOONS) ×1
BALLN MUSTANG 5X60X135 (BALLOONS) ×1
CATH ANGIO 5F PIGTAIL 100CM (CATHETERS) ×1
CATH OMNI FLUSH 5F 65CM (CATHETERS) ×1
CATH QUICKCROSS .035X135CM (MICROCATHETER) ×1
COVER DOME SNAP 22 D (MISCELLANEOUS) ×1
GLIDEWIRE ADV .035X260CM (WIRE) ×1
KIT ENCORE 26 ADVANTAGE (KITS) ×1
KIT MICROPUNCTURE NIT STIFF (SHEATH) ×1
SET ATX-X65L (MISCELLANEOUS) ×1
SHEATH CATAPULT 6F 90 MP (SHEATH) ×1
SHEATH PINNACLE 5F 10CM (SHEATH) ×1
STENT ELUVIA 6X60X130 (Permanent Stent) ×1 IMPLANT
TRAY PV CATH (CUSTOM PROCEDURE TRAY) ×1
TUBING CIL FLEX 10 FLL-RA (TUBING) ×1
WIRE BENTSON .035X145CM (WIRE) ×1
WIRE HI TORQ VERSACORE 300 (WIRE) ×1

## 2023-06-01 NOTE — Progress Notes (Addendum)
Overview: Andres Richardson is a 57 y.o. person living with a history of T2DM, HTN, PAD, HFpEF who presented with right foot ulcers and admitted for osteomyelitis.  Overnight: NPO for angiogram today.  Subjective: Feels okay after procedure. Is worried he will have some pain tonight.   Objective:   Vital signs in last 24 hours: Vitals:   05/31/23 1940 06/01/23 0421 06/01/23 0500 06/01/23 0712  BP: (!) 133/52 (!) 142/77  (!) 146/62  Pulse: 86 88  84  Resp: 17 17  17   Temp: 97.6 F (36.4 C) 98.5 F (36.9 C)  98 F (36.7 C)  TempSrc: Oral Oral  Oral  SpO2: 98% 100%  98%  Weight:   94.7 kg   Height:       Supplemental O2: Room Air Last BM Date : 05/30/23 SpO2: 98 % Filed Weights   05/28/23 0500 05/29/23 0606 06/01/23 0500  Weight: 88.2 kg 91.5 kg 94.7 kg    Intake/Output Summary (Last 24 hours) at 06/01/2023 0738 Last data filed at 06/01/2023 4098 Gross per 24 hour  Intake 54.48 ml  Output 1100 ml  Net -1045.52 ml   Net IO Since Admission: -7,369.29 mL [06/01/23 0738]  Physical Exam  General: NAD, laying comfortably in bed HENT: Atraumatic.Normocephalic  Lungs: Clear to auscutation bilaterally  MSK: No acute changes, no TTP on BLE, motor function intact, sensation intact. Digits perfusing well. No fluctuance or erythema. No crepitus. Wounds on the medial and lateral right foot covered with dressings. Bruising and stabilizing tablet on left arm.  Neuro: alert and oriented x4 Psych: normal mood and normal affect  Diagnostics    Latest Ref Rng & Units 06/01/2023    6:37 AM 05/31/2023    6:24 AM 05/30/2023    4:23 AM  CBC  WBC 4.0 - 10.5 K/uL 5.6  5.1  5.3   Hemoglobin 13.0 - 17.0 g/dL 8.4  8.6  8.6   Hematocrit 39.0 - 52.0 % 26.2  26.2  27.4   Platelets 150 - 400 K/uL 245  249  248        Latest Ref Rng & Units 06/01/2023    6:37 AM 05/31/2023    6:24 AM 05/30/2023    4:23 AM  BMP  Glucose 70 - 99 mg/dL 119  147  829   BUN 6 - 20 mg/dL 27  18  15     Creatinine 0.61 - 1.24 mg/dL 5.62  1.30  8.65   Sodium 135 - 145 mmol/L 136  135  139   Potassium 3.5 - 5.1 mmol/L 3.8  3.8  3.7   Chloride 98 - 111 mmol/L 106  104  106   CO2 22 - 32 mmol/L 20  24  23    Calcium 8.9 - 10.3 mg/dL 7.9  8.1  7.8    Assessment/Plan: Principal Problem:   Osteomyelitis of right lower extremity (HCC) Active Problems:   CAD (coronary artery disease)   Colostomy in place [facility])   Essential hypertension   Anemia   Elevated serum creatinine   PAD (peripheral artery disease) (HCC)   Diabetes (HCC)   Aortic atherosclerosis (HCC)  Medial/lateral right foot ulcers c/b fifth toe and first MP osteomyelitis Appreciate vascular and podiatry's involvement with this case. S/p angiography. Podiatry planning transmetatarsal amputation next. Blood cultures NGTD.  Cw Cefepime 2g q12 hours, Flagyl 500mg  q12 hours, and Vancomycin 1,500mg  IV daily.  Pt is on ASA, ok per vascular and podiatry he is to remain on ASA.  For Pain:  Tylenol 1000mg  q6hrs  Oxycodone q4hrs PNR for breakthrough pain.   Hyperlipidemia PAD/CAD S/p revascularization, per vascular, High brachial bifurcation in the left arm, successful treatment of 99% SFA lesion with drug-eluting stent. 30 mm gradient in the infrarenal aorta was not treated as he had palpable femoral pulses. Last LDL at 60 03/2023. Cw rosuvastatin 40mg  daily and Zetia 10mg  daily.   T2DM c/b neuropathy He was NPO today. Can restart 60 units tonight, if he safety returns to carb modified diet.    Normocytic Anemia d/t AICD Hgb is stable at 8.4 today. Continue monitoring with CBCs  HFpEF HTN Chronic. Patient has HFpEF and is on Lasix 40 mg daily.  Holding Lasix today because of AKI. Will continue amlodipine 5mg  for HTN.   CKDIIIa Baseline creatinine seems to be around ~1.1 (when not in the setting of infection or sickness). Holding lasix today. Continue encouraging PO intake today after procedure. Continue to monitor with RFP.    GERD Will continue pt's protonix 40 mg every day.   Diet: Carb-Modified IVF: None VTE: Enoxaparin Code: Full PT/OT recs: Pending after procedue Prior to Admission Living Arrangement: Home Anticipated Discharge Location: TBD/likely home Barriers to Discharge: Medical Stability Dispo: Anticipated discharge in approximately 3-4 day(s).

## 2023-06-01 NOTE — Progress Notes (Signed)
Pharmacy Antibiotic Note  Andres Richardson is a 57 y.o. male admitted on 05/26/2023 with Osteomyelitis with associated ulcerations to the right fifth MPJ, first MPJ .  Pharmacy has been consulted for vancomycin dosing and subsequent monitoring.  Patient on Vancomycin 1500 mg q24h. Renal function has been somewhat variable, today at Scr 1.8. Has been in 1.8 most of admission, but had a drop to 1.38 yesterday, back to 1.8 today. Will continue to monitor renal function while on vancomycin.   10/15 16:11 Peak: 39 (~2 hours post infusion stop) 10/16 13:56 "Trough": 15 (~24 hours post last dose stop)  PK: AUC: 657 Pt specific Ke: 0.0439  Will give patient some time to clear trough (aiming for serum level of ~12 prior to next dose). Plan to start new dose ~30 hours after last dose.  Plan: Will adjust to 1750 mg IV q36h (eAUC 511.3 at current renal function).  Levels as indicated. Goal AUC 400-550. Continue to monitor renal function daily. Will plan to adjust further if renal function fluctuates Follow source control and de-escalation as able.  Height: 5\' 11"  (180.3 cm) Weight: 94.7 kg (208 lb 12.4 oz) IBW/kg (Calculated) : 75.3  Temp (24hrs), Avg:98 F (36.7 C), Min:97.6 F (36.4 C), Max:98.5 F (36.9 C)  Recent Labs  Lab 05/26/23 2025 05/26/23 2036 05/28/23 0711 05/29/23 0631 05/30/23 0423 05/31/23 0624 05/31/23 1611 06/01/23 0637 06/01/23 1356  WBC 8.2  --  6.8  --  5.3 5.1  --  5.6  --   CREATININE 1.43*  --  1.81* 1.60* 1.86* 1.38*  --  1.80*  --   LATICACIDVEN  --  0.9  --   --   --   --   --   --   --   VANCOPEAK  --   --   --   --   --   --  39  --   --   VANCORANDOM  --   --   --   --   --   --   --   --  15    Estimated Creatinine Clearance: 53.2 mL/min (A) (by C-G formula based on SCr of 1.8 mg/dL (H)).    Allergies  Allergen Reactions   Adhesive [Tape] Rash   Thank you for allowing pharmacy to be a part of this patient's care.  Dalene Carrow, PharmD,  CPP 06/01/2023 3:19 PM

## 2023-06-01 NOTE — Progress Notes (Signed)
Patient was taken by transport to cath lab at about 9 am, didn't get any call for a report. Prepped patient, CHG, meds given, oral hygiene. PIV saline locked, verified with Charge Lauren Sexton, 100 ml of Normal saline was left on the bag.
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
## 2023-06-01 NOTE — Progress Notes (Signed)
[facility]  Subjective  - No new complaints.   Objective (!) 146/62 84 98 F (36.7 C) (Oral) 17 98%  Intake/Output Summary (Last 24 hours) at 06/01/2023 1022 Last data filed at 06/01/2023 1610 Gross per 24 hour  Intake 54.48 ml  Output 1100 ml  Net -1045.52 ml    Right foot dressing clean and dry Lungs non labored breathing  Assessment/Planning: Right CLI with tissue loss  Angio with possible intervention today.  Imaging demonstrates need for likely iliac intervention, as well as superficial femoral artery intervention. After discussing the risks and benefits, Andres Richardson elected to proceed. I plan to confirm the left brachial artery as the left-sided iliac system is occluded.  Surprisingly, he is asymptomatic in the left lower extremity.  Plan today is only to hopefully revascularize the right lower extremity to aid in wound healing.  Andres Richardson 06/01/2023 10:22 AM --    Laboratory Lab Results: Recent Labs    05/31/23 0624 06/01/23 0637  WBC 5.1 5.6  HGB 8.6* 8.4*  HCT 26.2* 26.2*  PLT 249 245   BMET Recent Labs    05/31/23 0624 06/01/23 0637  NA 135 136  K 3.8 3.8  CL 104 106  CO2 24 20*  GLUCOSE 247* 303*  BUN 18 27*  CREATININE 1.38* 1.80*  CALCIUM 8.1* 7.9*    COAG Lab Results  Component Value Date   INR 1.1 05/10/2021   No results found for: "PTT"

## 2023-06-01 NOTE — Progress Notes (Signed)
      Right medial and lateral Metatarsal non healing wounds  Assessment/Planning: Right CLI with tissue loss NPO and agrees to proceed with angiogram  Mosetta Pigeon PA-C

## 2023-06-01 NOTE — Progress Notes (Signed)
PODIATRY PROGRESS NOTE  NAME Andres Richardson MRN [id number] DOB [birth year] DOA 05/26/2023   Reason for consult:  Chief Complaint  Patient presents with   Diabetic Ulcer    History of present illness: 57 y.o. male admitted for ulceration of the right foot.  MRI shows osteomyelitis.  Patient underwent angiogram today.  Vitals:   06/01/23 1345 06/01/23 1620  BP: (!) 171/66 (!) 153/71  Pulse:  85  Resp:  18  Temp:  97.8 F (36.6 C)  SpO2:  100%    CBC    Component Value Date/Time   WBC 5.6 06/01/2023 0637   RBC 3.20 (L) 06/01/2023 0637   HGB 8.4 (L) 06/01/2023 0637   HGB 11.6 (L) 03/16/2023 1636   HCT 26.2 (L) 06/01/2023 0637   HCT 33.8 (L) 03/16/2023 1636   PLT 245 06/01/2023 0637   PLT 240 03/16/2023 1636   MCV 81.9 06/01/2023 0637   MCV 85 03/16/2023 1636   MCH 26.3 06/01/2023 0637   MCHC 32.1 06/01/2023 0637   RDW 14.9 06/01/2023 0637   RDW 14.0 03/16/2023 1636   LYMPHSABS 1.0 05/31/2023 0624   MONOABS 0.4 05/31/2023 0624   EOSABS 0.3 05/31/2023 0624   BASOSABS 0.1 05/31/2023 0624        Latest Ref Rng & Units 06/01/2023    6:37 AM 05/31/2023    6:24 AM 05/30/2023    4:23 AM  BMP  Glucose 70 - 99 mg/dL 956  213  086   BUN 6 - 20 mg/dL 27  18  15    Creatinine 0.61 - 1.24 mg/dL 5.78  4.69  6.29   Sodium 135 - 145 mmol/L 136  135  139   Potassium 3.5 - 5.1 mmol/L 3.8  3.8  3.7   Chloride 98 - 111 mmol/L 106  104  106   CO2 22 - 32 mmol/L 20  24  23    Calcium 8.9 - 10.3 mg/dL 7.9  8.1  7.8        Physical Exam: General: AAOx3, NAD  Dermatology: Full-thickness ulcerations noted along the first, fifth MTPJ.  There is no crepitation today.  No fluctuation.  Edema erythema noted along the wounds.  Drainage noted.  Vascular: Unable to palpate pulse  Neurological: Decreased  Musculoskeletal Exam: No pain on exam    ASSESSMENT/PLAN OF CARE Osteomyelitis, ulcerations right foot, PAD  Patient underwent angio today.  Unsuccessful treatment  present SFA lesion with drug-eluting stent.  At this time we discussed amputation.  Discussed transmetatarsal amputation.  Discussed the procedures postoperative course.  He agrees and wants to proceed with this.  Will plan for surgery tomorrow likely late afternoon.  Discussed risk of surgery including spread of infection, delayed/nonhealing, loss of limb/proximal leg loss, bleeding, scarring as well as general risk of surgery include stroke, heart attack, death.  Patient understands and wishes to proceed.  Will be n.p.o. after 8 AM tomorrow for dissipation surgery likely around 5 PM tomorrow.  Podiatry will continue to follow.  Dressing changed today.     Please contact me directly with any questions or concerns.     Ovid Curd, DPM [facility]  Dr. Lesia Sago. Brandace Cargle, DPM    [address], [place] [address]  Office [phone]  Fax [phone]

## 2023-06-01 NOTE — H&P (View-Only) (Signed)
PODIATRY PROGRESS NOTE  NAME Andres Richardson MRN [id number] DOB [birth year] DOA 05/26/2023   Reason for consult:  Chief Complaint  Patient presents with   Diabetic Ulcer    History of present illness: 57 y.o. male admitted for ulceration of the right foot.  MRI shows osteomyelitis.  Patient underwent angiogram today.  Vitals:   06/01/23 1345 06/01/23 1620  BP: (!) 171/66 (!) 153/71  Pulse:  85  Resp:  18  Temp:  97.8 F (36.6 C)  SpO2:  100%    CBC    Component Value Date/Time   WBC 5.6 06/01/2023 0637   RBC 3.20 (L) 06/01/2023 0637   HGB 8.4 (L) 06/01/2023 0637   HGB 11.6 (L) 03/16/2023 1636   HCT 26.2 (L) 06/01/2023 0637   HCT 33.8 (L) 03/16/2023 1636   PLT 245 06/01/2023 0637   PLT 240 03/16/2023 1636   MCV 81.9 06/01/2023 0637   MCV 85 03/16/2023 1636   MCH 26.3 06/01/2023 0637   MCHC 32.1 06/01/2023 0637   RDW 14.9 06/01/2023 0637   RDW 14.0 03/16/2023 1636   LYMPHSABS 1.0 05/31/2023 0624   MONOABS 0.4 05/31/2023 0624   EOSABS 0.3 05/31/2023 0624   BASOSABS 0.1 05/31/2023 0624        Latest Ref Rng & Units 06/01/2023    6:37 AM 05/31/2023    6:24 AM 05/30/2023    4:23 AM  BMP  Glucose 70 - 99 mg/dL 956  213  086   BUN 6 - 20 mg/dL 27  18  15    Creatinine 0.61 - 1.24 mg/dL 5.78  4.69  6.29   Sodium 135 - 145 mmol/L 136  135  139   Potassium 3.5 - 5.1 mmol/L 3.8  3.8  3.7   Chloride 98 - 111 mmol/L 106  104  106   CO2 22 - 32 mmol/L 20  24  23    Calcium 8.9 - 10.3 mg/dL 7.9  8.1  7.8        Physical Exam: General: AAOx3, NAD  Dermatology: Full-thickness ulcerations noted along the first, fifth MTPJ.  There is no crepitation today.  No fluctuation.  Edema erythema noted along the wounds.  Drainage noted.  Vascular: Unable to palpate pulse  Neurological: Decreased  Musculoskeletal Exam: No pain on exam    ASSESSMENT/PLAN OF CARE Osteomyelitis, ulcerations right foot, PAD  Patient underwent angio today.  Unsuccessful treatment  present SFA lesion with drug-eluting stent.  At this time we discussed amputation.  Discussed transmetatarsal amputation.  Discussed the procedures postoperative course.  He agrees and wants to proceed with this.  Will plan for surgery tomorrow likely late afternoon.  Discussed risk of surgery including spread of infection, delayed/nonhealing, loss of limb/proximal leg loss, bleeding, scarring as well as general risk of surgery include stroke, heart attack, death.  Patient understands and wishes to proceed.  Will be n.p.o. after 8 AM tomorrow for dissipation surgery likely around 5 PM tomorrow.  Podiatry will continue to follow.  Dressing changed today.     Please contact me directly with any questions or concerns.     Ovid Curd, DPM [facility]  Dr. Lesia Sago. Brandace Cargle, DPM    [address], [place] [address]  Office [phone]  Fax [phone]

## 2023-06-02 ENCOUNTER — Encounter (HOSPITAL_COMMUNITY)
Admission: EM | Disposition: A | Discharge status: Home Health | Payer: Self-pay | Source: Home / Self Care | Attending: Internal Medicine

## 2023-06-02 ENCOUNTER — Inpatient Hospital Stay (HOSPITAL_COMMUNITY): Payer: Medicare Other

## 2023-06-02 ENCOUNTER — Other Ambulatory Visit: Payer: Self-pay

## 2023-06-02 ENCOUNTER — Encounter (HOSPITAL_COMMUNITY): Payer: Self-pay | Admitting: Vascular Surgery

## 2023-06-02 ENCOUNTER — Inpatient Hospital Stay (HOSPITAL_COMMUNITY): Payer: Medicare Other | Admitting: Anesthesiology

## 2023-06-02 DIAGNOSIS — M869 Osteomyelitis, unspecified: Secondary | ICD-10-CM | POA: Diagnosis not present

## 2023-06-02 DIAGNOSIS — E1169 Type 2 diabetes mellitus with other specified complication: Secondary | ICD-10-CM

## 2023-06-02 HISTORY — PX: TRANSMETATARSAL AMPUTATION: SHX6197

## 2023-06-02 LAB — RENAL FUNCTION PANEL
Albumin: 1.9 g/dL — ABNORMAL LOW (ref 3.5–5.0)
Anion gap: 10 (ref 5–15)
BUN: 24 mg/dL — ABNORMAL HIGH (ref 6–20)
CO2: 21 mmol/L — ABNORMAL LOW (ref 22–32)
Calcium: 8.5 mg/dL — ABNORMAL LOW (ref 8.9–10.3)
Chloride: 108 mmol/L (ref 98–111)
Creatinine, Ser: 1.68 mg/dL — ABNORMAL HIGH (ref 0.61–1.24)
GFR, Estimated: 47 mL/min — ABNORMAL LOW (ref >60)
Glucose, Bld: 250 mg/dL — ABNORMAL HIGH (ref 70–99)
Phosphorus: 3.8 mg/dL (ref 2.5–4.6)
Potassium: 3.7 mmol/L (ref 3.5–5.1)
Sodium: 139 mmol/L (ref 135–145)

## 2023-06-02 LAB — GLUCOSE, CAPILLARY
Glucose-Capillary: 185 mg/dL — ABNORMAL HIGH (ref 70–99)
Glucose-Capillary: 245 mg/dL — ABNORMAL HIGH (ref 70–99)
Glucose-Capillary: 208 mg/dL — ABNORMAL HIGH (ref 70–99)
Glucose-Capillary: 168 mg/dL — ABNORMAL HIGH (ref 70–99)
Glucose-Capillary: 150 mg/dL — ABNORMAL HIGH (ref 70–99)
Glucose-Capillary: 164 mg/dL — ABNORMAL HIGH (ref 70–99)

## 2023-06-02 LAB — LIPID PANEL
Cholesterol: 80 mg/dL (ref 0–200)
HDL: 24 mg/dL — ABNORMAL LOW (ref >40)
LDL Cholesterol: 30 mg/dL (ref 0–99)
Total CHOL/HDL Ratio: 3.3 {ratio}
Triglycerides: 130 mg/dL (ref <150)
VLDL: 26 mg/dL (ref 0–40)

## 2023-06-02 LAB — CBC
HCT: 28.1 % — ABNORMAL LOW (ref 39.0–52.0)
Hemoglobin: 9 g/dL — ABNORMAL LOW (ref 13.0–17.0)
MCH: 26.9 pg (ref 26.0–34.0)
MCHC: 32 g/dL (ref 30.0–36.0)
MCV: 83.9 fL (ref 80.0–100.0)
Platelets: 262 10*3/uL (ref 150–400)
RBC: 3.35 MIL/uL — ABNORMAL LOW (ref 4.22–5.81)
RDW: 15.3 % (ref 11.5–15.5)
WBC: 5.2 10*3/uL (ref 4.0–10.5)
nRBC: 0 % (ref 0.0–0.2)

## 2023-06-02 SURGERY — AMPUTATION, FOOT, TRANSMETATARSAL
Anesthesia: General | Site: Toe | Laterality: Right

## 2023-06-02 MED ORDER — FENTANYL CITRATE (PF) 100 MCG/2ML IJ SOLN: 25.0000 ug | INTRAMUSCULAR | Status: DC | PRN

## 2023-06-02 MED ORDER — OXYCODONE HCL 5 MG PO TABS: 5.0000 mg | ORAL_TABLET | Freq: Once | ORAL | Status: DC | PRN

## 2023-06-02 MED ORDER — CHLORHEXIDINE GLUCONATE 0.12 % MT SOLN
15.0000 mL | OROMUCOSAL | Status: AC
  Administered 2023-06-02: 15 mL via OROMUCOSAL
  Filled 2023-06-02: qty 15

## 2023-06-02 MED ORDER — LIDOCAINE HCL (PF) 2 % IJ SOLN
INTRAMUSCULAR | Status: DC | PRN
  Administered 2023-06-02: 20 mL

## 2023-06-02 MED ORDER — EPHEDRINE 5 MG/ML INJ
INTRAVENOUS | Status: AC
  Filled 2023-06-02: qty 5

## 2023-06-02 MED ORDER — LIDOCAINE 2% (20 MG/ML) 5 ML SYRINGE
INTRAMUSCULAR | Status: AC
  Filled 2023-06-02: qty 5

## 2023-06-02 MED ORDER — GLYCOPYRROLATE PF 0.2 MG/ML IJ SOSY
PREFILLED_SYRINGE | INTRAMUSCULAR | Status: AC
  Filled 2023-06-02: qty 1

## 2023-06-02 MED ORDER — ARTIFICIAL TEARS OPHTHALMIC OINT
TOPICAL_OINTMENT | OPHTHALMIC | Status: AC
  Filled 2023-06-02: qty 3.5

## 2023-06-02 MED ORDER — SUCCINYLCHOLINE CHLORIDE 200 MG/10ML IV SOSY
PREFILLED_SYRINGE | INTRAVENOUS | Status: AC
  Filled 2023-06-02: qty 10

## 2023-06-02 MED ORDER — ONDANSETRON HCL 4 MG/2ML IJ SOLN: 4.0000 mg | Freq: Once | INTRAMUSCULAR | Status: DC | PRN

## 2023-06-02 MED ORDER — ACETAMINOPHEN 160 MG/5ML PO SOLN: 325.0000 mg | ORAL | Status: DC | PRN

## 2023-06-02 MED ORDER — LIDOCAINE 2% (20 MG/ML) 5 ML SYRINGE
INTRAMUSCULAR | Status: DC | PRN
  Administered 2023-06-02: 60 mg via INTRAVENOUS

## 2023-06-02 MED ORDER — OXYCODONE HCL 5 MG/5ML PO SOLN: 5.0000 mg | Freq: Once | ORAL | Status: DC | PRN

## 2023-06-02 MED ORDER — PHENYLEPHRINE HCL-NACL 20-0.9 MG/250ML-% IV SOLN
INTRAVENOUS | Status: DC | PRN
  Administered 2023-06-02: 40 ug/min via INTRAVENOUS

## 2023-06-02 MED ORDER — PHENYLEPHRINE 80 MCG/ML (10ML) SYRINGE FOR IV PUSH (FOR BLOOD PRESSURE SUPPORT)
PREFILLED_SYRINGE | INTRAVENOUS | Status: AC
  Filled 2023-06-02: qty 10

## 2023-06-02 MED ORDER — ONDANSETRON HCL 4 MG/2ML IJ SOLN
INTRAMUSCULAR | Status: DC | PRN
  Administered 2023-06-02: 4 mg via INTRAVENOUS

## 2023-06-02 MED ORDER — ACETAMINOPHEN 325 MG PO TABS: 325.0000 mg | ORAL_TABLET | ORAL | Status: DC | PRN

## 2023-06-02 MED ORDER — 0.9 % SODIUM CHLORIDE (POUR BTL) OPTIME
TOPICAL | Status: DC | PRN
  Administered 2023-06-02: 1000 mL

## 2023-06-02 MED ORDER — PHENYLEPHRINE 80 MCG/ML (10ML) SYRINGE FOR IV PUSH (FOR BLOOD PRESSURE SUPPORT)
PREFILLED_SYRINGE | INTRAVENOUS | Status: DC | PRN
  Administered 2023-06-02 (×2): 160 ug via INTRAVENOUS
  Administered 2023-06-02 (×2): 240 ug via INTRAVENOUS

## 2023-06-02 MED ORDER — PHENYLEPHRINE HCL-NACL 20-0.9 MG/250ML-% IV SOLN
INTRAVENOUS | Status: AC
  Filled 2023-06-02: qty 250

## 2023-06-02 MED ORDER — NEOSTIGMINE METHYLSULFATE 3 MG/3ML IV SOSY
PREFILLED_SYRINGE | INTRAVENOUS | Status: AC
  Filled 2023-06-02: qty 3

## 2023-06-02 MED ORDER — MEPERIDINE HCL 25 MG/ML IJ SOLN: 6.2500 mg | INTRAMUSCULAR | Status: DC | PRN

## 2023-06-02 MED ORDER — ONDANSETRON HCL 4 MG/2ML IJ SOLN
INTRAMUSCULAR | Status: AC
  Filled 2023-06-02: qty 2

## 2023-06-02 MED ORDER — PROPOFOL 10 MG/ML IV BOLUS
INTRAVENOUS | Status: AC
  Filled 2023-06-02: qty 20

## 2023-06-02 MED ORDER — INSULIN GLARGINE-YFGN 100 UNIT/ML ~~LOC~~ SOLN
60.0000 [IU] | Freq: Every day | SUBCUTANEOUS | Status: DC
  Administered 2023-06-02 – 2023-06-05 (×4): 60 [IU] via SUBCUTANEOUS
  Filled 2023-06-02 (×5): qty 0.6

## 2023-06-02 MED ORDER — ROCURONIUM BROMIDE 10 MG/ML (PF) SYRINGE
PREFILLED_SYRINGE | INTRAVENOUS | Status: AC
  Filled 2023-06-02: qty 10

## 2023-06-02 MED ORDER — FENTANYL CITRATE (PF) 250 MCG/5ML IJ SOLN
INTRAMUSCULAR | Status: AC
  Filled 2023-06-02: qty 5

## 2023-06-02 MED ORDER — MIDAZOLAM HCL 2 MG/2ML IJ SOLN
INTRAMUSCULAR | Status: DC | PRN
  Administered 2023-06-02 (×2): 1 mg via INTRAVENOUS

## 2023-06-02 MED ORDER — MIDAZOLAM HCL 2 MG/2ML IJ SOLN
INTRAMUSCULAR | Status: AC
  Filled 2023-06-02: qty 2

## 2023-06-02 MED ORDER — SODIUM CHLORIDE 0.9 % IV SOLN
INTRAVENOUS | Status: DC | PRN
Start: 2023-06-02 — End: 2023-06-02

## 2023-06-02 MED ORDER — FENTANYL CITRATE (PF) 250 MCG/5ML IJ SOLN
INTRAMUSCULAR | Status: DC | PRN
  Administered 2023-06-02 (×2): 50 ug via INTRAVENOUS

## 2023-06-02 MED ORDER — PROPOFOL 10 MG/ML IV BOLUS
INTRAVENOUS | Status: DC | PRN
  Administered 2023-06-02: 50 mg via INTRAVENOUS
  Administered 2023-06-02: 100 mg via INTRAVENOUS
  Administered 2023-06-02: 50 mg via INTRAVENOUS

## 2023-06-02 SURGICAL SUPPLY — 53 items
BAG COUNTER SPONGE SURGICOUNT (BAG) ×1 IMPLANT
BAG SPNG CNTER NS LX DISP (BAG) ×1
BANDAGE ESMARK 6X9 LF (GAUZE/BANDAGES/DRESSINGS) IMPLANT
BLADE AVERAGE 25X9 (BLADE) ×1 IMPLANT
BLADE SURG 10 STRL SS (BLADE) ×1 IMPLANT
BNDG CMPR 5X4 KNIT ELC UNQ LF (GAUZE/BANDAGES/DRESSINGS) ×1
BNDG CMPR 9X6 STRL LF SNTH (GAUZE/BANDAGES/DRESSINGS) ×1
BNDG COHESIVE 4X5 TAN STRL (GAUZE/BANDAGES/DRESSINGS) ×1 IMPLANT
BNDG ELASTIC 4INX 5YD STR LF (GAUZE/BANDAGES/DRESSINGS) IMPLANT
BNDG ESMARK 6X9 LF (GAUZE/BANDAGES/DRESSINGS) ×1
BNDG GAUZE DERMACEA FLUFF 4 (GAUZE/BANDAGES/DRESSINGS) IMPLANT
BNDG GZE DERMACEA 4 6PLY (GAUZE/BANDAGES/DRESSINGS) ×1
CLEANER TIP ELECTROSURG 2X2 (MISCELLANEOUS) IMPLANT
CUFF TOURN SGL QUICK 34 (TOURNIQUET CUFF)
CUFF TOURN SGL QUICK 42 (TOURNIQUET CUFF) IMPLANT
CUFF TRNQT CYL 34X4.125X (TOURNIQUET CUFF) IMPLANT
DRAPE U-SHAPE 47X51 STRL (DRAPES) ×2 IMPLANT
DURAPREP 26ML APPLICATOR (WOUND CARE) ×1 IMPLANT
ELECT REM PT RETURN 9FT ADLT (ELECTROSURGICAL) ×1
ELECTRODE REM PT RTRN 9FT ADLT (ELECTROSURGICAL) ×1 IMPLANT
GAUZE PAD ABD 8X10 STRL (GAUZE/BANDAGES/DRESSINGS) IMPLANT
GAUZE SPONGE 4X4 12PLY STRL (GAUZE/BANDAGES/DRESSINGS) ×1 IMPLANT
GAUZE XEROFORM 5X9 LF (GAUZE/BANDAGES/DRESSINGS) ×1 IMPLANT
GLOVE BIO SURGEON STRL SZ8 (GLOVE) ×2 IMPLANT
GOWN STRL REUS W/ TWL LRG LVL3 (GOWN DISPOSABLE) ×1 IMPLANT
GOWN STRL REUS W/ TWL XL LVL3 (GOWN DISPOSABLE) ×1 IMPLANT
GOWN STRL REUS W/TWL LRG LVL3 (GOWN DISPOSABLE) ×1
GOWN STRL REUS W/TWL XL LVL3 (GOWN DISPOSABLE) ×1
KIT BASIN OR (CUSTOM PROCEDURE TRAY) ×1 IMPLANT
KIT TURNOVER KIT B (KITS) ×1 IMPLANT
NDL 25GX 5/8IN NON SAFETY (NEEDLE) ×1 IMPLANT
NEEDLE 25GX 5/8IN NON SAFETY (NEEDLE) ×1 IMPLANT
NS IRRIG 1000ML POUR BTL (IV SOLUTION) ×1 IMPLANT
PACK ORTHO EXTREMITY (CUSTOM PROCEDURE TRAY) ×1 IMPLANT
PAD ARMBOARD 7.5X6 YLW CONV (MISCELLANEOUS) ×2 IMPLANT
PAD CAST 4YDX4 CTTN HI CHSV (CAST SUPPLIES) ×1 IMPLANT
PADDING CAST COTTON 4X4 STRL (CAST SUPPLIES)
SPIKE FLUID TRANSFER (MISCELLANEOUS) ×1 IMPLANT
SPONGE T-LAP 18X18 ~~LOC~~+RFID (SPONGE) ×2 IMPLANT
STAPLER VISISTAT 35W (STAPLE) ×1 IMPLANT
STOCKINETTE IMPERVIOUS LG (DRAPES) IMPLANT
SUT ETHILON 2 0 PSLX (SUTURE) IMPLANT
SUT ETHILON 3 0 PS 1 (SUTURE) IMPLANT
SUT MNCRL AB 3-0 PS2 27 (SUTURE) IMPLANT
SUT PROLENE 3 0 PS 2 (SUTURE) ×2 IMPLANT
SWAB COLLECTION DEVICE MRSA (MISCELLANEOUS) IMPLANT
SWAB CULTURE ESWAB REG 1ML (MISCELLANEOUS) IMPLANT
SYR CONTROL 10ML LL (SYRINGE) ×1 IMPLANT
TOWEL GREEN STERILE (TOWEL DISPOSABLE) ×1 IMPLANT
TOWEL GREEN STERILE FF (TOWEL DISPOSABLE) ×1 IMPLANT
TUBE CONNECTING 12X1/4 (SUCTIONS) ×1 IMPLANT
WATER STERILE IRR 1000ML POUR (IV SOLUTION) ×1 IMPLANT
YANKAUER SUCT BULB TIP NO VENT (SUCTIONS) ×1 IMPLANT

## 2023-06-02 NOTE — Interval H&P Note (Signed)
History and Physical Interval Note:  06/02/2023 5:50 PM  Andres Richardson  has presented today for surgery, with the diagnosis of Osteomyelitis.  The various methods of treatment have been discussed with the patient and family. After consideration of risks, benefits and other options for treatment, the patient has consented to  Procedure(s): TRANSMETATARSAL AMPUTATION (Right) as a surgical intervention.  The patient's history has been reviewed, patient examined, no change in status, stable for surgery.  I have reviewed the patient's chart and labs.  Questions were answered to the patient's satisfaction.     [name]

## 2023-06-02 NOTE — Progress Notes (Addendum)
Overview: Andres Richardson is a 57 y.o. person living with a history of T2DM, HTN, PAD, HFpEF who presented with right foot ulcers and admitted for osteomyelitis.  Overnight: none  Subjective: doing well. Denies any symptoms.   Objective:   Vital signs in last 24 hours: Vitals:   06/02/23 0342 06/02/23 0343 06/02/23 0500 06/02/23 0755  BP: (!) 175/63 (!) 166/69  132/81  Pulse: 82 80  87  Resp:  18  18  Temp:  98.2 F (36.8 C)  99 F (37.2 C)  TempSrc:  Oral  Oral  SpO2: 100% 100%  100%  Weight:   94.4 kg   Height:       Supplemental O2: Room Air Last BM Date : 06/02/23 SpO2: 100 % Filed Weights   05/29/23 0606 06/01/23 0500 06/02/23 0500  Weight: 91.5 kg 94.7 kg 94.4 kg    Intake/Output Summary (Last 24 hours) at 06/02/2023 1031 Last data filed at 06/02/2023 0104 Gross per 24 hour  Intake 2627.54 ml  Output --  Net 2627.54 ml   Net IO Since Admission: -4,741.75 mL [06/02/23 1031]  Physical Exam  General: NAD, laying comfortably in bed HENT: Atraumatic.Normocephalic  Lungs: Clear to auscutation bilaterally  MSK: No acute changes, TTP LE laterally on the shin, no migrating erythema or crepitus. motor function intact, sensation intact. Digits perfusing well. Wounds on the medial and lateral right foot covered with dressings.  Neuro: alert and oriented Psych: normal mood and normal affect  Diagnostics    Latest Ref Rng & Units 06/02/2023    3:55 AM 06/01/2023    6:37 AM 05/31/2023    6:24 AM  CBC  WBC 4.0 - 10.5 K/uL 5.2  5.6  5.1   Hemoglobin 13.0 - 17.0 g/dL 9.0  8.4  8.6   Hematocrit 39.0 - 52.0 % 28.1  26.2  26.2   Platelets 150 - 400 K/uL 262  245  249        Latest Ref Rng & Units 06/02/2023    3:55 AM 06/01/2023    6:37 AM 05/31/2023    6:24 AM  BMP  Glucose 70 - 99 mg/dL 782  956  213   BUN 6 - 20 mg/dL 24  27  18    Creatinine 0.61 - 1.24 mg/dL 0.86  5.78  4.69   Sodium 135 - 145 mmol/L 139  136  135   Potassium 3.5 - 5.1 mmol/L 3.7  3.8   3.8   Chloride 98 - 111 mmol/L 108  106  104   CO2 22 - 32 mmol/L 21  20  24    Calcium 8.9 - 10.3 mg/dL 8.5  7.9  8.1    Assessment/Plan: Principal Problem:   Osteomyelitis of right lower extremity (HCC) Active Problems:   CAD (coronary artery disease)   Colostomy in place [facility])   Essential hypertension   Anemia   Elevated serum creatinine   PAD (peripheral artery disease) (HCC)   Diabetes (HCC)   Aortic atherosclerosis (HCC)  Medial/lateral right foot ulcers c/b fifth toe and first MP osteomyelitis Appreciate Vascular and Podiatry's involvement. S/p angiography yesterday 10/16. Transmetatarsal amputation today. NPO after 8am. Will continue antibiotics until after the procedure.  Cw Cefepime 2g q12 hours, Flagyl 500mg  q12 hours, and Vancomycin 1,750mg  IV q36 hours Cw ASA Will need follow up in 4-6 wks with ABIs and RLE arterial duplex  Left armboard can be removed when cleared by Dr.Robins  For Pain:  Tylenol 1000mg  q6hrs  Oxycodone q4hrs PNR for breakthrough pain.   Hyperlipidemia PAD/CAD S/p revascularization, treatment of 99% SFA lesion with drug-eluting stent.. Extremities have palpable pulses, are well perfused. Motion and sensation intact.  Cw rosuvastatin 40mg  daily and Zetia 10mg  daily.  May benefit from exercise plan OP  Should quit smoking.   T2DM c/b neuropathy NPO yesterday. Fasting glucose 185 this AM. S/p 45 units. He is NPO after 8am today. Will need to resume 60 units tonight once he transitions to a Carb modified diet after procedure.   Normocytic Anemia d/t AICD Hgb is in the 8-9 range. Continue monitoring with CBCs  HFpEF HTN Chronic. Patient has HFpEF and is on Lasix 40 mg daily.  Holding Lasix today because of NPO status. Will continue amlodipine 5mg  for HTN.   CKDIIIa Baseline creatinine seems to be around ~1.1 (when not in the setting of infection or sickness). Holding lasix today. No crackles on exam today or BLE edema. Continue encouraging PO  intake today after procedure. Continue to monitor with RFP.   GERD Will continue pt's protonix 40 mg every day.   Diet: Carb-Modified IVF: None VTE: Enoxaparin Code: Full PT/OT recs: Pending after procedue Prior to Admission Living Arrangement: Home Anticipated Discharge Location: TBD/likely home Barriers to Discharge: Medical Stability Dispo: Anticipated discharge in approximately 3-4 day(s).

## 2023-06-02 NOTE — Transfer of Care (Signed)
Immediate Anesthesia Transfer of Care Note  Patient: Andres Richardson  Procedure(s) Performed: TRANSMETATARSAL AMPUTATION (Right: Toe)  Patient Location: PACU  Anesthesia Type:General  Level of Consciousness: drowsy  Airway & Oxygen Therapy: Patient Spontanous Breathing  Post-op Assessment: Report given to RN and Post -op Vital signs reviewed and stable  Post vital signs: Reviewed and stable  Last Vitals:  Vitals Value Taken Time  BP 118/51 06/02/23 1934  Temp 98   Pulse 83 06/02/23 1939  Resp 10 06/02/23 1939  SpO2 94 % 06/02/23 1939  Vitals shown include unfiled device data.  Last Pain:  Vitals:   06/02/23 0827  TempSrc:   PainSc: 0-No pain      Patients Stated Pain Goal: 0 (06/01/23 0854)  Complications: No notable events documented.

## 2023-06-02 NOTE — Progress Notes (Addendum)
Progress Note    06/02/2023 8:13 AM 1 Day Post-Op  Subjective:  feels fine this morning. Left arm is a little sore    Vitals:   06/02/23 0343 06/02/23 0755  BP: (!) 166/69 132/81  Pulse: 80 87  Resp: 18 18  Temp: 98.2 F (36.8 C) 99 F (37.2 C)  SpO2: 100% 100%    Physical Exam: General:  sitting up in bed, alert and oriented x4 Cardiac:  regular Lungs:  nonlabored Incisions:  L brachial cath site soft without hematoma Extremities:  L arm straight with arm board. Palpable left radial pulse. Right foot wounds unchanged, palpable right DP  CBC    Component Value Date/Time   WBC 5.2 06/02/2023 0355   RBC 3.35 (L) 06/02/2023 0355   HGB 9.0 (L) 06/02/2023 0355   HGB 11.6 (L) 03/16/2023 1636   HCT 28.1 (L) 06/02/2023 0355   HCT 33.8 (L) 03/16/2023 1636   PLT 262 06/02/2023 0355   PLT 240 03/16/2023 1636   MCV 83.9 06/02/2023 0355   MCV 85 03/16/2023 1636   MCH 26.9 06/02/2023 0355   MCHC 32.0 06/02/2023 0355   RDW 15.3 06/02/2023 0355   RDW 14.0 03/16/2023 1636   LYMPHSABS 1.0 05/31/2023 0624   MONOABS 0.4 05/31/2023 0624   EOSABS 0.3 05/31/2023 0624   BASOSABS 0.1 05/31/2023 0624    BMET    Component Value Date/Time   NA 139 06/02/2023 0355   NA 139 03/16/2023 1636   K 3.7 06/02/2023 0355   CL 108 06/02/2023 0355   CO2 21 (L) 06/02/2023 0355   GLUCOSE 250 (H) 06/02/2023 0355   BUN 24 (H) 06/02/2023 0355   BUN 18 03/16/2023 1636   CREATININE 1.68 (H) 06/02/2023 0355   CALCIUM 8.5 (L) 06/02/2023 0355   GFRNONAA 47 (L) 06/02/2023 0355   GFRAA >60 05/14/2019 0317    INR    Component Value Date/Time   INR 1.1 05/10/2021 0808     Intake/Output Summary (Last 24 hours) at 06/02/2023 0813 Last data filed at 06/02/2023 0104 Gross per 24 hour  Intake 2627.54 ml  Output --  Net 2627.54 ml      Assessment/Plan:  57 y.o. male is 1 day post op, s/p: right SFA balloon angioplasty and stenting   -No issues overnight. L brachial and radial sticks  are soft without hematoma -LUE well perfused with palpable radial pulse -No changes to RLE wounds. RLE well perfused now with palpable DP pulse -Okay to proceed with right TMA later today with podiatry -Will arrange follow up in 4-6 wks with ABIs and RLE arterial duplex  -Left armboard can be removed when cleared by Dr.Amritpal Shropshire   Loel Dubonnet, PA-C [facility] [phone] 06/02/2023 8:13 AM  VASCULAR STAFF ADDENDUM: I have independently interviewed and examined the patient. I agree with the above.  Will hold on aortic stent due to pulse exam this morning.  I had a long conversation with Fayrene Fearing regarding his stent, and the need for smoking cessation and glucose control for wound healing.  I think that he has been optimized.  While an aortic stent could be placed, I do not think this would significantly impact his perfusion as he has a palpable right femoral pulse with strong multiphasic signals in the foot.  Palpable DP. Armboard removed.  My plan is to see him in 1 month's time.  Should the wound demonstrate signs of poor healing, we would move forward with aortic stent as a last resort.  [name] MD [facility] Phone Number: [phone] 06/02/2023 9:03 AM

## 2023-06-02 NOTE — Anesthesia Procedure Notes (Addendum)
Procedure Name: LMA Insertion Date/Time: 06/02/2023 6:37 PM  Performed by: Camillia Herter, CRNAPre-anesthesia Checklist: Patient identified, Emergency Drugs available, Suction available and Patient being monitored Patient Re-evaluated:Patient Re-evaluated prior to induction Oxygen Delivery Method: Circle System Utilized Preoxygenation: Pre-oxygenation with 100% oxygen Induction Type: IV induction Ventilation: Mask ventilation without difficulty LMA: LMA inserted LMA Size: 5.0 Number of attempts: 1 Airway Equipment and Method: Bite block Placement Confirmation: positive ETCO2 Tube secured with: Tape Dental Injury: Teeth and Oropharynx as per pre-operative assessment

## 2023-06-02 NOTE — Anesthesia Postprocedure Evaluation (Signed)
Anesthesia Post Note  Patient: Andres Richardson  Procedure(s) Performed: TRANSMETATARSAL AMPUTATION (Right: Toe)     Patient location during evaluation: PACU Anesthesia Type: General Level of consciousness: awake and alert Pain management: pain level controlled Vital Signs Assessment: post-procedure vital signs reviewed and stable Respiratory status: spontaneous breathing, nonlabored ventilation, respiratory function stable and patient connected to nasal cannula oxygen Cardiovascular status: blood pressure returned to baseline and stable Postop Assessment: no apparent nausea or vomiting Anesthetic complications: no  No notable events documented.  Last Vitals:  Vitals:   06/02/23 2000 06/02/23 2021  BP:  119/66  Pulse: 87   Resp: 17 16  Temp: 36.7 C 37.2 C  SpO2: 96% 98%    Last Pain:  Vitals:   06/02/23 2021  TempSrc: Oral  PainSc:                  Shelton Silvas

## 2023-06-02 NOTE — Anesthesia Preprocedure Evaluation (Addendum)
Anesthesia Evaluation  Patient identified by MRN, date of birth, ID band Patient awake    Reviewed: Allergy & Precautions, NPO status , Patient's Chart, lab work & pertinent test results  Airway Mallampati: I  TM Distance: >3 FB Neck ROM: Full    Dental  (+) Edentulous Upper, Edentulous Lower   Pulmonary Current Smoker and Patient abstained from smoking., former smoker    + decreased breath sounds      Cardiovascular hypertension, Pt. on medications and Pt. on home beta blockers (-) angina + CAD, + Peripheral Vascular Disease and +CHF   Rhythm:Regular Rate:Normal   Prox RCA lesion is 100% stenosed.  Prox Cx to Mid Cx lesion is 60% stenosed.  LPAV lesion is 60% stenosed.  Ost LAD to Prox LAD lesion is 40% stenosed.  02/2023 ECHO: EF 50 to 55%.  1. The LV has low normal function, no regional wall motion abnormalities. The left ventricular internal cavity size was mildly dilated. Grade I diastolic dysfunction  (impaired relaxation).   2. RVF is normal. The right ventricular size is normal.   3. Left atrial size was mildly dilated.   4. The mitral valve is abnormal. Trivial mitral valve regurgitation. No mitral stenosis.   5. The aortic valve was not well visualized. There is moderate calcification of the aortic valve. There is moderate thickening of the aortic valve. Aortic valve regurgitation is mild. Aortic valve sclerosis/calcification is present, without any evidence  of aortic stenosis.     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), Type 2, Insulin Dependent, Oral Hypoglycemic Agents    Renal/GU ARFRenal disease     Musculoskeletal   Abdominal   Peds  Hematology  (+) Blood dyscrasia (Hb 9.0, plt 262k), anemia   Anesthesia Other Findings   Reproductive/Obstetrics                              Anesthesia Physical Anesthesia Plan  ASA: 4  Anesthesia Plan: General   Post-op Pain Management: Minimal or no pain anticipated and Ofirmev IV (intra-op)*   Induction: Intravenous  PONV Risk Score and Plan: 1 and Treatment may vary due to age or medical condition  Airway Management Planned: LMA  Additional Equipment:   Intra-op Plan:   Post-operative Plan:   Informed Consent: I have reviewed the patients History and Physical, chart, labs and discussed the procedure including the risks, benefits and alternatives for the proposed anesthesia with the patient or authorized representative who has indicated his/her understanding and acceptance.       Plan Discussed with:   Anesthesia Plan Comments:         Anesthesia Quick Evaluation

## 2023-06-02 NOTE — Progress Notes (Signed)
Orthopedic Tech Progress Note Patient Details:  Andres Richardson 19-Jan-1966 [id number]  Ortho Devices Type of Ortho Device: CAM walker Ortho Device/Splint Location: RLE Ortho Device/Splint Interventions: Ordered, Application, Removal   Post Interventions Patient Tolerated: Well Instructions Provided: Care of device  Donald Pore 06/02/2023, 8:09 PM

## 2023-06-02 NOTE — Progress Notes (Signed)
Upon pt's arrival to pre-op, IV- 20g to the RUA grossly infiltrated with swelling to entire anterior bicep. Cefepime 2g currently infusing. Cefepime stopped, and IV removed. Clear fluid leakage from site w/o blood. Pt denies pain. Attempted to removed IV in chart, but IV site was never documented. Dr. Sandford Craze from anesthesia made aware, and she placed another IV. Pt tolerated well. Will continue to monitor.

## 2023-06-02 NOTE — Op Note (Signed)
PATIENT:  Andres Richardson  57 y.o. male  PRE-OPERATIVE DIAGNOSIS:  Osteomyelitis  POST-OPERATIVE DIAGNOSIS:  Osteomyelitis  PROCEDURE:  Procedure(s): TRANSMETATARSAL AMPUTATION (Right)  SURGEON:  Surgeons and Role:    * [name], DPM - Primary  PHYSICIAN ASSISTANT:   ASSISTANTS: none   ANESTHESIA:   general  EBL:  25 mL   BLOOD ADMINISTERED:none  DRAINS: none   LOCAL MEDICATIONS USED:  MARCAINE   , BUPIVICAINE , and Amount: 20 ml  SPECIMEN:  Source of Specimen:  foot for pathology  DISPOSITION OF SPECIMEN:  PATHOLOGY  COUNTS:  Yes  TOURNIQUET:  * Missing tourniquet times found for documented tourniquets in log: [id number] *  DICTATION: .[name] Dictation  PLAN OF CARE: Admit to inpatient   PATIENT DISPOSITION:  PACU - hemodynamically stable.   Delay start of Pharmacological VTE agent (>24hrs) due to surgical blood loss or risk of bleeding: no  Indications for surgery: 57 year old male as a hospital follow-up ulcerations of his right foot both medial and lateral MPJs.  Also underwent revascularization with vascular surgery.  Given the infection we discussed transmetatarsal amputation.  Alternatives, risks, complications were discussed.  No promises or guarantees were given the procedure and all questions were answered best my ability.  Procedure in detail: The patient was both verbally and verbally and visually identified by myself, nursing staff, anesthesia staff preoperatively.  He was then transferred the operating room via stretcher and placed on the table in supine position.  LMA was placed.  The right lower extremity scrubbed, prepped, draped in normal sterile fashion.  Timeout was performed.  At this time incision was made for the transmetatarsal amputation. The incision was made with 15 scalpel and dissection was then carried down to bone was electrocautery as well as sharp dissection.  Key elevator was utilized to resect soft tissue from the metatarsals 1  through 5.  Sagittal saw was utilized to resect the bone.  Plantar flap was then raised.  The forefoot was disarticulated and passed off the table.  I debrided nonviable flank soft tissue.  There is no purulence or proximal tracking.  I do not scan for coverage surgery sites further metatarsal to get the bone coverage.  The remaining bone appear to be viable.  I demonstrated incision with saline hemostasis was achieved.  I then closed the incision with 3-0 nylon as well as skin staples.  Xeroform was applied followed by dressing.  A small Incision Planter tolerated procedure well any complications.  Transferred to PACU vital signs stable vascular status intact.  Postop course: Remain inpatient IV antibiotics.  Nonweightbearing right lower extremity.  I will plan to change the dressing tomorrow.

## 2023-06-03 ENCOUNTER — Telehealth: Payer: Self-pay | Admitting: Podiatry

## 2023-06-03 ENCOUNTER — Encounter (HOSPITAL_COMMUNITY): Payer: Self-pay | Admitting: Podiatry

## 2023-06-03 LAB — RENAL FUNCTION PANEL
Albumin: 1.8 g/dL — ABNORMAL LOW (ref 3.5–5.0)
Anion gap: 7 (ref 5–15)
BUN: 25 mg/dL — ABNORMAL HIGH (ref 6–20)
CO2: 19 mmol/L — ABNORMAL LOW (ref 22–32)
Calcium: 7.7 mg/dL — ABNORMAL LOW (ref 8.9–10.3)
Chloride: 111 mmol/L (ref 98–111)
Creatinine, Ser: 1.51 mg/dL — ABNORMAL HIGH (ref 0.61–1.24)
GFR, Estimated: 54 mL/min — ABNORMAL LOW (ref >60)
Glucose, Bld: 163 mg/dL — ABNORMAL HIGH (ref 70–99)
Phosphorus: 3.4 mg/dL (ref 2.5–4.6)
Potassium: 3.8 mmol/L (ref 3.5–5.1)
Sodium: 137 mmol/L (ref 135–145)

## 2023-06-03 LAB — CBC
HCT: 24.1 % — ABNORMAL LOW (ref 39.0–52.0)
Hemoglobin: 7.5 g/dL — ABNORMAL LOW (ref 13.0–17.0)
MCH: 26.8 pg (ref 26.0–34.0)
MCHC: 31.1 g/dL (ref 30.0–36.0)
MCV: 86.1 fL (ref 80.0–100.0)
Platelets: 225 10*3/uL (ref 150–400)
RBC: 2.8 MIL/uL — ABNORMAL LOW (ref 4.22–5.81)
RDW: 15.9 % — ABNORMAL HIGH (ref 11.5–15.5)
WBC: 6.7 10*3/uL (ref 4.0–10.5)
nRBC: 0 % (ref 0.0–0.2)

## 2023-06-03 LAB — GLUCOSE, CAPILLARY
Glucose-Capillary: 149 mg/dL — ABNORMAL HIGH (ref 70–99)
Glucose-Capillary: 174 mg/dL — ABNORMAL HIGH (ref 70–99)
Glucose-Capillary: 164 mg/dL — ABNORMAL HIGH (ref 70–99)
Glucose-Capillary: 92 mg/dL (ref 70–99)

## 2023-06-03 MED ORDER — FUROSEMIDE 40 MG PO TABS
40.0000 mg | ORAL_TABLET | Freq: Every day | ORAL | Status: DC
  Administered 2023-06-03 – 2023-06-06 (×4): 40 mg via ORAL
  Filled 2023-06-03 (×4): qty 1

## 2023-06-03 MED ORDER — DOXYCYCLINE HYCLATE 100 MG PO TABS
100.0000 mg | ORAL_TABLET | Freq: Two times a day (BID) | ORAL | Status: DC
  Administered 2023-06-03 – 2023-06-06 (×6): 100 mg via ORAL
  Filled 2023-06-03 (×7): qty 1

## 2023-06-03 MED ORDER — AMOXICILLIN-POT CLAVULANATE 875-125 MG PO TABS
1.0000 | ORAL_TABLET | Freq: Two times a day (BID) | ORAL | Status: DC
  Administered 2023-06-03 – 2023-06-06 (×6): 1 via ORAL
  Filled 2023-06-03 (×8): qty 1

## 2023-06-03 NOTE — Progress Notes (Addendum)
PHARMACIST LIPID MONITORING   Andres Richardson is a 57 y.o. male admitted on 05/26/2023 with right lower extremity critical limb ischemia with tissue loss.  Pharmacy has been consulted to optimize lipid-lowering therapy with the indication of secondary prevention for clinical ASCVD.  Recent Labs:  Lipid Panel (last 6 months):   Lab Results  Component Value Date   CHOL 80 06/02/2023   TRIG 130 06/02/2023   HDL 24 (L) 06/02/2023   CHOLHDL 3.3 06/02/2023   VLDL 26 06/02/2023   LDLCALC 30 06/02/2023   LDLDIRECT 60.0 03/23/2023    Hepatic function panel (last 6 months):   Lab Results  Component Value Date   AST 7 (L) 05/26/2023   ALT 7 05/26/2023   ALKPHOS 81 05/26/2023   BILITOT 0.6 05/26/2023    SCr (since admission):   Serum creatinine: 1.51 mg/dL (H) 16/10/96 0454 Estimated creatinine clearance: 63.4 mL/min (A)  Current therapy and lipid therapy tolerance Current lipid-lowering therapy: rosuvastatin 40 mg po every day. Previous lipid-lowering therapies (if applicable): rosuvastatin 40 mg po every day.  Documented or reported allergies or intolerances to lipid-lowering therapies (if applicable): None   Plan:    1.Statin intensity (high intensity recommended for all patients regardless of the LDL):  No statin changes. The patient is already on a high intensity statin.  2.Add ezetimibe (if any one of the following):   Patient is currently ezetimibe 10 mg PO every day.  3.Refer to lipid clinic:   No  4.Follow-up with:  Primary care provider - Richardson, [name], DO  5.Follow-up labs after discharge:  No changes in lipid therapy, repeat a lipid panel in one year.       Elicia Lamp, PharmD, CPP 06/03/2023, 1:43 PM

## 2023-06-03 NOTE — Plan of Care (Signed)
CHL Tonsillectomy/Adenoidectomy, Postoperative PEDS care plan entered in error.

## 2023-06-03 NOTE — Progress Notes (Signed)
PODIATRY PROGRESS NOTE  NAME Andres Richardson MRN [id number] DOB July 10, 1966 DOA 05/26/2023   Reason for consult:  Chief Complaint  Patient presents with   Diabetic Ulcer     History of present illness: 57 y.o. male POD # 1 s/p right TMA. Denies any fevers or chills. Occasional pain and the pain medication does control the pain. No new concerns.   Vitals:   06/03/23 0937 06/03/23 1155  BP: 131/63 129/66  Pulse: 92 99  Resp: 16 14  Temp:  99.7 F (37.6 C)  SpO2: 98% 98%       Latest Ref Rng & Units 06/03/2023    6:39 AM 06/02/2023    3:55 AM 06/01/2023    6:37 AM  CBC  WBC 4.0 - 10.5 K/uL 6.7  5.2  5.6   Hemoglobin 13.0 - 17.0 g/dL 7.5  9.0  8.4   Hematocrit 39.0 - 52.0 % 24.1  28.1  26.2   Platelets 150 - 400 K/uL 225  262  245        Latest Ref Rng & Units 06/03/2023    6:39 AM 06/02/2023    3:55 AM 06/01/2023    6:37 AM  BMP  Glucose 70 - 99 mg/dL 413  244  010   BUN 6 - 20 mg/dL 25  24  27    Creatinine 0.61 - 1.24 mg/dL 2.72  5.36  6.44   Sodium 135 - 145 mmol/L 137  139  136   Potassium 3.5 - 5.1 mmol/L 3.8  3.7  3.8   Chloride 98 - 111 mmol/L 111  108  106   CO2 22 - 32 mmol/L 19  21  20    Calcium 8.9 - 10.3 mg/dL 7.7  8.5  7.9       Physical Exam: General: AAO x 3, NAD   Dermatology: Incision appears to be healing well coapted with sutures, staples intact.  There is an allergy but there is no significant cellulitis present.  There is no fluctuation or crepitation.  No malodor.       Vascular: Foot is warm and perfused  Neurological: Decreased   Musculoskeletal Exam: No pain on exam    ASSESSMENT/PLAN OF CARE  -Dressing changed today.  Xeroform was applied followed by dry sterile dressing.  He can leave dressing intact until follow-up, do not get wet.  -NWB -Elevation -Continue Augmentin and doxycyline  -From podiatry standpoint able to be discharged once cleared by the medical service, physical therapy.  I will follow-up in the  office with him next week.  I will arrange follow-up.   Please contact me directly with any questions or concerns.     Ovid Curd, DPM [facility]  Dr. Lesia Sago. Cecylia Brazill, DPM    [address] [place], [place] [address]                Office [phone]  Fax [phone]

## 2023-06-03 NOTE — Telephone Encounter (Signed)
Patient is being discharged.  Can you please schedule follow-up towards the end of the week?  Thank you.

## 2023-06-03 NOTE — Progress Notes (Addendum)
Overview: Andres Richardson is a 57 y.o. person living with a history of T2DM, HTN, PAD, HFpEF who presented with right foot ulcers and admitted for osteomyelitis.  Overnight: none  Subjective: doing well. Sleepy after pain pill. Is eating well. Denies SOB.  Objective:   Vital signs in last 24 hours: Vitals:   06/03/23 0404 06/03/23 0743 06/03/23 0937 06/03/23 1155  BP: 136/62 105/73 131/63 129/66  Pulse: 93 98 92 99  Resp: 16 16 16 14   Temp: 98 F (36.7 C) 99.2 F (37.3 C)  99.7 F (37.6 C)  TempSrc: Oral Oral  Oral  SpO2: 98% 99% 98% 98%  Weight: 94.5 kg     Height:       Supplemental O2: Room Air Last BM Date : 06/03/23 SpO2: 98 % Filed Weights   06/02/23 0500 06/02/23 1638 06/03/23 0404  Weight: 94.4 kg 95.3 kg 94.5 kg    Intake/Output Summary (Last 24 hours) at 06/03/2023 1338 Last data filed at 06/03/2023 0830 Gross per 24 hour  Intake 720 ml  Output 1305 ml  Net -585 ml   Net IO Since Admission: -5,326.75 mL [06/03/23 1338]  Physical Exam  General: NAD, laying comfortably in bed HENT: Atraumatic.Normocephalic  Lungs: Clear to auscutation except for mild crackles on the right lower lobe. MSK: No acute changes, NTTP, perfusing well on both extremities. Right foot covered with dressings. Neuro: alert and oriented Psych: normal mood and normal affect  Diagnostics    Latest Ref Rng & Units 06/03/2023    6:39 AM 06/02/2023    3:55 AM 06/01/2023    6:37 AM  CBC  WBC 4.0 - 10.5 K/uL 6.7  5.2  5.6   Hemoglobin 13.0 - 17.0 g/dL 7.5  9.0  8.4   Hematocrit 39.0 - 52.0 % 24.1  28.1  26.2   Platelets 150 - 400 K/uL 225  262  245        Latest Ref Rng & Units 06/03/2023    6:39 AM 06/02/2023    3:55 AM 06/01/2023    6:37 AM  BMP  Glucose 70 - 99 mg/dL 161  096  045   BUN 6 - 20 mg/dL 25  24  27    Creatinine 0.61 - 1.24 mg/dL 4.09  8.11  9.14   Sodium 135 - 145 mmol/L 137  139  136   Potassium 3.5 - 5.1 mmol/L 3.8  3.7  3.8   Chloride 98 - 111 mmol/L  111  108  106   CO2 22 - 32 mmol/L 19  21  20    Calcium 8.9 - 10.3 mg/dL 7.7  8.5  7.9    Assessment/Plan: Principal Problem:   Osteomyelitis of right lower extremity (HCC) Active Problems:   CAD (coronary artery disease)   Colostomy in place [facility])   Essential hypertension   Anemia   Elevated serum creatinine   PAD (peripheral artery disease) (HCC)   Diabetes (HCC)   Aortic atherosclerosis (HCC)  Medial/lateral right foot ulcers c/b fifth toe and first MP osteomyelitis Appreciate Vascular and Podiatry's involvement. S/p angiography 10/16. Transmetatarsal amputation 10/17. Transition to PO antibiotics for 7 day course. Cw ASA Start PO Augmentin and Doxycycline.  For Pain:  Tylenol 1000mg  q6hrs  Oxycodone q4hrs PRN for breakthrough pain.   Hyperlipidemia PAD/CAD S/p revascularization, treatment of 99% SFA lesion with drug-eluting stent.. Extremities have palpable pulses, are well perfused. Motion and sensation intact.  Cw rosuvastatin 40mg  daily and Zetia 10mg  daily.  May benefit  from exercise plan OP  Should quit smoking.  FU with Vascular outpatient. Will need follow up in 4-6 wks with ABIs and RLE arterial duplex   T2DM c/b neuropathy Fasting glucose was 149 this AM. Will continue with 60 units at bedtime today.   Normocytic Anemia d/t AICD Hgb is in the 8-9 range. Continue monitoring with CBCs  HFpEF HTN Chronic. Patient has HFpEF and is on Lasix 40 mg daily.  Weight is up from admission and has mild crackles on the right lower lobe. Re-start lasix 40mg  daily. Will continue amlodipine 5mg  for HTN.   CKDIIIa Baseline creatinine seems to be around ~1.1 (when not in the setting of infection or sickness). Continue encouraging PO intake today. Continue to monitor with RFP.   GERD Will continue pt's protonix 40 mg every day.   Diet: Carb-Modified IVF: None VTE: Enoxaparin Code: Full PT/OT recs: Pending after procedue Prior to Admission Living Arrangement:  Home Anticipated Discharge Location: TBD/likely home Barriers to Discharge: Medical Stability Dispo: Anticipated discharge in approximately 1-2 day(s).

## 2023-06-03 NOTE — Evaluation (Signed)
Physical Therapy Evaluation Patient Details Name: Andres Richardson MRN: [id number] DOB: Dec 02, 1965 Today's Date: 06/03/2023  History of Present Illness  57 y/o male presents [facility] 05/26/23 w/ ulceration on R foot. 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 PMHx: CAD, colostomy in place, HTN, anemia, PAD, T2DM, HFpEF   Clinical Impression  Pt in bed upon arrival and agreeable to PT eval. Prior to R transmetatarsal amputation, pt was ambulating with SP cane and independent with mobility and ADLs. Pt presents to therapy session below previous level of mobility with decreased balance and mobility likely due to NWB status. Pt was able to stand and use RW with hop to gait pattern with MinA for RW management with cues to adhere to WB precautions. Pt was limited in today's session due to feeling "woozy" with VSS. Anticipate pt will progress well due to prior independence with mobility. Discussed with pt that he may need more rehab prior to discharging home if he is unable to progress mobility. Pt would benefit from acute skilled PT to address functional impairments. Acute PT to follow.          If plan is discharge home, recommend the following: A little help with walking and/or transfers;A little help with bathing/dressing/bathroom;Help with stairs or ramp for entrance     Equipment Recommendations None recommended by PT (may need WC and BSC, will continue to assess)     Functional Status Assessment Patient has had a recent decline in their functional status and demonstrates the ability to make significant improvements in function in a reasonable and predictable amount of time.     Precautions / Restrictions Precautions Precautions: Fall Required Braces or Orthoses: Other Brace Other Brace: R CAM boot with OOB mobility Restrictions Weight Bearing Restrictions: Yes RLE Weight Bearing: Non weight bearing      Mobility  Bed Mobility Overal bed mobility: Modified Independent      Transfers Overall transfer level: Needs assistance Equipment used: Rolling walker (2 wheels) Transfers: Sit to/from Stand Sit to Stand: Min assist      General transfer comment: MinA for initial rise, multiple rocks to [employer]. cues for adhere to WB precautions and for hand placement with RW.    Ambulation/Gait Ambulation/Gait assistance: Min assist Gait Distance (Feet): 2 Feet Assistive device: Rolling walker (2 wheels) Gait Pattern/deviations: Trunk flexed (hop to gait pattern) Gait velocity: dec     General Gait Details: hop to gait pattern, heavy UE use. MinA for RW management and balance. Pt able to take a few hops forwards/backwards. Limited by feeling "woozy" with no changes in VS        Balance Overall balance assessment: Needs assistance Sitting-balance support: Feet supported, No upper extremity supported (1 foot supported) Sitting balance-Leahy Scale: Good     Standing balance support: Bilateral upper extremity supported, During functional activity, Reliant on assistive device for balance Standing balance-Leahy Scale: Poor Standing balance comment: reliant on RW for stability          Pertinent Vitals/Pain Pain Assessment Pain Assessment: Faces Faces Pain Scale: Hurts even more Pain Location: R foot Pain Descriptors / Indicators: Aching, Discomfort Pain Intervention(s): Monitored during session, Limited activity within patient's tolerance, Repositioned    Home Living Family/patient expects to be discharged to:: Private residence Living Arrangements: Non-relatives/Friends (friend) Available Help at Discharge: Available 24 hours/day (24/7 care from friends) Type of Home: House Home Access: Stairs to enter Entrance Stairs-Rails: Can reach both;Left;Right Entrance Stairs-Number of Steps: 3   Home Layout: Two  level;Able to live on main level with bedroom/bathroom Home Equipment: Shower seat - built [employer] (2 wheels);Cane - single point       Prior Function Prior Level of Function : Independent/Modified Independent    Mobility Comments: SP cane for short distances, carts for shopping ADLs Comments: independent with ADLs     Extremity/Trunk Assessment   Upper Extremity Assessment Upper Extremity Assessment: Overall WFL for tasks assessed    Lower Extremity Assessment Lower Extremity Assessment: Overall WFL for tasks assessed (R ankle bandaged, n/t)    Cervical / Trunk Assessment Cervical / Trunk Assessment: Normal  Communication   Communication Communication: No apparent difficulties Cueing Techniques: Verbal cues  Cognition Arousal: Alert Behavior During Therapy: WFL for tasks assessed/performed Overall Cognitive Status: Within Functional Limits for tasks assessed         General Comments General comments (skin integrity, edema, etc.): VSS on RA     PT Assessment Patient needs continued PT services  PT Problem List Decreased activity tolerance;Decreased balance;Decreased strength;Decreased mobility;Decreased knowledge of use of DME;Decreased safety awareness       PT Treatment Interventions DME instruction;Gait training;Stair training;Functional mobility training;Therapeutic activities;Therapeutic exercise;Neuromuscular re-education;Balance training;Patient/family education    PT Goals (Current goals can be found in the Care Plan section)  Acute Rehab PT Goals Patient Stated Goal: to go home PT Goal Formulation: With patient Time For Goal Achievement: 06/17/23 Potential to Achieve Goals: Good    Frequency Min 1X/week        AM-PAC PT "6 Clicks" Mobility  Outcome Measure Help needed turning from your back to your side while in a flat bed without using bedrails?: None Help needed moving from lying on your back to sitting on the side of a flat bed without using bedrails?: None Help needed moving to and from a bed to a chair (including a wheelchair)?: A Little Help needed standing up from a chair  using your arms (e.g., wheelchair or bedside chair)?: A Little Help needed to walk in hospital room?: A Little Help needed climbing 3-5 steps with a railing? : A Lot 6 Click Score: 19    End of Session   Activity Tolerance: Patient limited by fatigue Patient left: in bed;with call bell/phone within reach;with bed alarm set Nurse Communication: Mobility status PT Visit Diagnosis: Unsteadiness on feet (R26.81);Other abnormalities of gait and mobility (R26.89)    Time: 1610-9604 PT Time Calculation (min) (ACUTE ONLY): 27 min   Charges:   PT Evaluation $PT Eval Low Complexity: 1 Low PT Treatments $Therapeutic Activity: 8-22 mins PT General Charges $$ ACUTE PT VISIT: 1 Visit         Hilton Cork, PT, DPT Secure Chat Preferred  Rehab Office [phone]   Arturo Morton Brion Aliment 06/03/2023, 9:52 AM

## 2023-06-03 NOTE — Plan of Care (Signed)

## 2023-06-04 ENCOUNTER — Other Ambulatory Visit (HOSPITAL_COMMUNITY): Payer: Self-pay

## 2023-06-04 LAB — RENAL FUNCTION PANEL
Albumin: 1.9 g/dL — ABNORMAL LOW (ref 3.5–5.0)
Anion gap: 14 (ref 5–15)
BUN: 21 mg/dL — ABNORMAL HIGH (ref 6–20)
CO2: 18 mmol/L — ABNORMAL LOW (ref 22–32)
Calcium: 8.9 mg/dL (ref 8.9–10.3)
Chloride: 107 mmol/L (ref 98–111)
Creatinine, Ser: 1.29 mg/dL — ABNORMAL HIGH (ref 0.61–1.24)
GFR, Estimated: >60 mL/min (ref >60)
Glucose, Bld: 80 mg/dL (ref 70–99)
Phosphorus: 2.7 mg/dL (ref 2.5–4.6)
Potassium: 3.5 mmol/L (ref 3.5–5.1)
Sodium: 139 mmol/L (ref 135–145)

## 2023-06-04 LAB — CBC
HCT: 23.5 % — ABNORMAL LOW (ref 39.0–52.0)
Hemoglobin: 7.5 g/dL — ABNORMAL LOW (ref 13.0–17.0)
MCH: 26.6 pg (ref 26.0–34.0)
MCHC: 31.9 g/dL (ref 30.0–36.0)
MCV: 83.3 fL (ref 80.0–100.0)
Platelets: 225 10*3/uL (ref 150–400)
RBC: 2.82 MIL/uL — ABNORMAL LOW (ref 4.22–5.81)
RDW: 16 % — ABNORMAL HIGH (ref 11.5–15.5)
WBC: 7.5 10*3/uL (ref 4.0–10.5)
nRBC: 0 % (ref 0.0–0.2)

## 2023-06-04 LAB — MAGNESIUM: Magnesium: 1.6 mg/dL — ABNORMAL LOW (ref 1.7–2.4)

## 2023-06-04 LAB — GLUCOSE, CAPILLARY
Glucose-Capillary: 185 mg/dL — ABNORMAL HIGH (ref 70–99)
Glucose-Capillary: 142 mg/dL — ABNORMAL HIGH (ref 70–99)
Glucose-Capillary: 175 mg/dL — ABNORMAL HIGH (ref 70–99)
Glucose-Capillary: 211 mg/dL — ABNORMAL HIGH (ref 70–99)

## 2023-06-04 LAB — IRON AND TIBC
Iron: 10 ug/dL — ABNORMAL LOW (ref 45–182)
Saturation Ratios: 4 % — ABNORMAL LOW (ref 17.9–39.5)
TIBC: 259 ug/dL (ref 250–450)
UIBC: 249 ug/dL

## 2023-06-04 LAB — FERRITIN: Ferritin: 216 ng/mL (ref 24–336)

## 2023-06-04 MED ORDER — TRESIBA FLEXTOUCH 100 UNIT/ML ~~LOC~~ SOPN
60.0000 [IU] | PEN_INJECTOR | Freq: Every day | SUBCUTANEOUS | 0 refills | Status: AC
  Filled 2023-06-04: qty 18, 30d supply, fill #0

## 2023-06-04 MED ORDER — OXYCODONE HCL 5 MG PO TABS
5.0000 mg | ORAL_TABLET | Freq: Four times a day (QID) | ORAL | 0 refills | Status: DC | PRN
  Filled 2023-06-04: qty 20, 5d supply, fill #0

## 2023-06-04 MED ORDER — DOXYCYCLINE HYCLATE 100 MG PO TABS
100.0000 mg | ORAL_TABLET | Freq: Two times a day (BID) | ORAL | 0 refills | Status: DC
  Filled 2023-06-04: qty 12, 6d supply, fill #0

## 2023-06-04 MED ORDER — FIASP FLEXTOUCH 100 UNIT/ML ~~LOC~~ SOPN
5.0000 [IU] | PEN_INJECTOR | Freq: Three times a day (TID) | SUBCUTANEOUS | 0 refills | Status: AC
  Filled 2023-06-04: qty 6, 30d supply, fill #0

## 2023-06-04 MED ORDER — BD PEN NEEDLE NANO U/F 32G X 4 MM MISC
1.0000 [IU] | Freq: Four times a day (QID) | 0 refills | Status: DC
  Filled 2023-06-04: qty 100, 30d supply, fill #0

## 2023-06-04 MED ORDER — MAGNESIUM SULFATE 4 GM/100ML IV SOLN
4.0000 g | Freq: Once | INTRAVENOUS | Status: AC
  Administered 2023-06-04: 4 g via INTRAVENOUS
  Filled 2023-06-04: qty 100

## 2023-06-04 MED ORDER — EZETIMIBE 10 MG PO TABS
10.0000 mg | ORAL_TABLET | Freq: Every day | ORAL | 0 refills | Status: DC
  Filled 2023-06-04: qty 30, 30d supply, fill #0

## 2023-06-04 MED ORDER — CLOPIDOGREL BISULFATE 75 MG PO TABS
75.0000 mg | ORAL_TABLET | Freq: Every day | ORAL | 0 refills | Status: AC
  Filled 2023-06-04: qty 30, 30d supply, fill #0

## 2023-06-04 MED ORDER — AMOXICILLIN-POT CLAVULANATE 875-125 MG PO TABS
1.0000 | ORAL_TABLET | Freq: Two times a day (BID) | ORAL | 0 refills | Status: DC
  Filled 2023-06-04: qty 12, 6d supply, fill #0

## 2023-06-04 NOTE — Discharge Instructions (Addendum)
You came to the emergency department due to foot ulcers and were admitted.  Vascular surgery saw you and placed stents to improve blood flow and the foot doctor saw you and took part of the foot with ulcer away.  You are given IV antibiotics and we will change this to oral antibiotics.  Physical therapy will come to your home and continue to work with you.  The foot doctor will arrange clinic follow-up next week.  More important is your control of diabetes.  Your diabetes were well-controlled during the hospital so we will continue the same regimen at home.  Please limit your food intake this like you did in the hospital.  We will discharge on 60 units of long-acting insulin and 5 units of mealtime insulin.  Only use the mealtime insulin a few have food in front of you.  Getting good control of your sugars is very important to allow for wound healing and so you can have reversal of your colostomy.  Please call our clinic so we can see you in the next 10 to 14 days and see how your foot and your diabetes are doing.

## 2023-06-04 NOTE — Progress Notes (Signed)
Overview: Andres Richardson is a 57 y.o. person living with a history of T2DM, HTN, PAD, HFpEF who presented with right foot ulcers and admitted for osteomyelitis.   Overnight:NAEON  Subjective: No acute concerns. States would like to go home and see his cat.     Objective: afebrile, normotensive, satting well on RA.  Vital signs in last 24 hours: Vitals:   06/03/23 1155 06/03/23 1537 06/03/23 1934 06/04/23 0348  BP: 129/66 137/63 (!) 149/71 (!) 133/46  Pulse: 99 98 98 85  Resp: 14 16 19 16   Temp: 99.7 F (37.6 C) 98.5 F (36.9 C) 99.7 F (37.6 C) 99.4 F (37.4 C)  TempSrc: Oral Oral Oral Oral  SpO2: 98% 98% 99% 97%  Weight:    91.7 kg  Height:       Supplemental O2: Room Air Last BM Date : 06/03/23 SpO2: 97 % Filed Weights   06/02/23 1638 06/03/23 0404 06/04/23 0348  Weight: 95.3 kg 94.5 kg 91.7 kg    Intake/Output Summary (Last 24 hours) at 06/04/2023 0747 Last data filed at 06/04/2023 0600 Gross per 24 hour  Intake 240 ml  Output 2980 ml  Net -2740 ml   Net IO Since Admission: -7,816.75 mL [06/04/23 0747]  Physical Exam General: NAD, laying in bed HENT: NCAT Lungs: CTAB Cardiovascular: RRR, good radial pulses  Abdomen: No TTP of abdomen, bowel sounds present MSK: No asymmetry Skin: no lesions on skin, right lower extremity with bandage on. Neuro: alert and oriented x4 Psych: pleasant mood and affect  Diagnostics    Latest Ref Rng & Units 06/04/2023    5:54 AM 06/03/2023    6:39 AM 06/02/2023    3:55 AM  CBC  WBC 4.0 - 10.5 K/uL 7.5  6.7  5.2   Hemoglobin 13.0 - 17.0 g/dL 7.5  7.5  9.0   Hematocrit 39.0 - 52.0 % 23.5  24.1  28.1   Platelets 150 - 400 K/uL 225  225  262        Latest Ref Rng & Units 06/04/2023    5:54 AM 06/03/2023    6:39 AM 06/02/2023    3:55 AM  BMP  Glucose 70 - 99 mg/dL 80  865  784   BUN 6 - 20 mg/dL 21  25  24    Creatinine 0.61 - 1.24 mg/dL 6.96  2.95  2.84   Sodium 135 - 145 mmol/L 139  137  139   Potassium 3.5 -  5.1 mmol/L 3.5  3.8  3.7   Chloride 98 - 111 mmol/L 107  111  108   CO2 22 - 32 mmol/L 18  19  21    Calcium 8.9 - 10.3 mg/dL 8.9  7.7  8.5     Magnesium 1.6  Iron Studies  Assessment/Plan: Principal Problem:   Osteomyelitis of right lower extremity (HCC) Active Problems:   CAD (coronary artery disease)   Colostomy in place [facility])   Essential hypertension   Anemia   Elevated serum creatinine   PAD (peripheral artery disease) (HCC)   Diabetes (HCC)   Aortic atherosclerosis (HCC)  Medial/lateral right foot ulcers c/b fifth toe and first MP osteomyelitis Appreciate Vascular and Podiatry's involvement. S/p angiography 10/16. Transmetatarsal amputation 10/17. Transition to PO antibiotics for 7 day course. Follow up with podiatry OP.  Cw ASA Continue PO Augmentin and Doxycycline.  For Pain:  Tylenol 1000mg  q6hrs  Oxycodone q4hrs PRN for breakthrough pain.    Hyperlipidemia PAD/CAD S/p revascularization, treatment of 99%  SFA lesion with drug-eluting stent.. Extremities have palpable pulses, are well perfused. Motion and sensation intact.  Cw rosuvastatin 40mg  daily and Zetia 10mg  daily.  May benefit from exercise plan OP  Should quit smoking.  FU with Vascular outpatient. Will need follow up in 4-6 wks with ABIs and RLE arterial duplex    T2DM c/b neuropathy Fasting glucose was 185 this AM. Will continue with 60 units at bedtime today.    Normocytic Anemia 2/2 to ABLA  Hgb is in the 8-9 range. Continue monitoring with CBCs. Iron studies added this am.    HFpEF HTN Chronic. Patient has HFpEF and is on Lasix 40 mg daily. Lasix restarted yesterday.  GERD Will continue pt's protonix 40 mg every day.   Diet: Carb-Modified IVF: None,None VTE: Enoxaparin Code: Full PT/OT recs: Pending Prior to Admission Living Arrangement: Home Anticipated Discharge Location: Home Barriers to Discharge: Medical Stability Dispo: Anticipated discharge in approximately 1-2 day(s).   Gwenevere Abbot, MD [name]. [facility] Internal Medicine Residency, PGY-3  Pager: [phone] After 5 pm and on weekends: Please call the on-call pager

## 2023-06-04 NOTE — TOC Transition Note (Addendum)
Transition of Care [facility]) - CM/SW Discharge Note   Patient Details  Name: [name] MRN: [id number] Date of Birth: Oct 07, 1965  [facility]) CM/SW Contact:  Ronny Bacon, RN Phone Number: 06/04/2023, 3:44 PM   Clinical Narrative: Patient with tentative discharge today. Spoke with patient by phone reports that he is being allowed to stay a couple more days so that "they can work with me." DME RW and 3:1 arranged through [employer]. Message left for Royanne Foots for [facility] PT. Awaiting confirmation of acceptance.     1610: Royanne Foots accepted patient.    Final next level of care: Home w Home Health Services Barriers to Discharge: No Barriers Identified   Patient Goals and CMS Choice      Discharge Placement                         Discharge Plan and Services Additional resources added to the After Visit Summary for                  DME Arranged: Walker rolling, 3-N-1 DME Agency: [employer] Date DME Agency Contacted: 06/04/23 Time DME Agency Contacted: [phone] Representative spoke with at DME Agency: [facility] Arranged: PT (Message left for Royanne Foots) [facility] Agency: [employer] [facility] Date [facility] Agency Contacted: 06/04/23 Time HH Agency Contacted: 1537 Representative spoke with at [facility] Agency: Message left for [employer]  Social Determinants of Health (SDOH) Interventions SDOH Screenings   Food Insecurity: No Food Insecurity (05/27/2023)  Housing: Low Risk  (05/27/2023)  Transportation Needs: No Transportation Needs (05/27/2023)  Utilities: Not At Risk (05/27/2023)  Alcohol Screen: Low Risk  (02/16/2023)  Depression (PHQ2-9): Low Risk  (02/16/2023)  Financial Resource Strain: Low Risk  (02/16/2023)  Physical Activity: Insufficiently Active (02/16/2023)  Social Connections: Moderately Isolated (02/16/2023)  Stress: No Stress Concern Present (02/16/2023)  Tobacco Use: High Risk (06/02/2023)     Readmission Risk Interventions     No  data to display

## 2023-06-04 NOTE — Plan of Care (Signed)

## 2023-06-04 NOTE — Progress Notes (Signed)
PODIATRY PROGRESS NOTE  NAME Andres Richardson MRN [id number] DOB June 08, 1966 DOA 05/26/2023   Reason for consult:  Chief Complaint  Patient presents with   Diabetic Ulcer     History of present illness: 57 y.o. male POD# 2 s/p right TMA.  States he is feeling well.  Denies any fevers or chills.  Pain is currently controlled with occasional discomfort.  No new concerns.  Vitals:   06/03/23 1934 06/04/23 0348  BP: (!) 149/71 (!) 133/46  Pulse: 98 85  Resp: 19 16  Temp: 99.7 F (37.6 C) 99.4 F (37.4 C)  SpO2: 99% 97%       Latest Ref Rng & Units 06/04/2023    5:54 AM 06/03/2023    6:39 AM 06/02/2023    3:55 AM  CBC  WBC 4.0 - 10.5 K/uL 7.5  6.7  5.2   Hemoglobin 13.0 - 17.0 g/dL 7.5  7.5  9.0   Hematocrit 39.0 - 52.0 % 23.5  24.1  28.1   Platelets 150 - 400 K/uL 225  225  262        Latest Ref Rng & Units 06/04/2023    5:54 AM 06/03/2023    6:39 AM 06/02/2023    3:55 AM  BMP  Glucose 70 - 99 mg/dL 80  409  811   BUN 6 - 20 mg/dL 21  25  24    Creatinine 0.61 - 1.24 mg/dL 9.14  7.82  9.56   Sodium 135 - 145 mmol/L 139  137  139   Potassium 3.5 - 5.1 mmol/L 3.5  3.8  3.7   Chloride 98 - 111 mmol/L 107  111  108   CO2 22 - 32 mmol/L 18  19  21    Calcium 8.9 - 10.3 mg/dL 8.9  7.7  8.5       Physical Exam: General: AOx3, NAD  Dermatology: Incision well coapted with sutures, staples intact.  There is still some edema present but there is no significant erythema, ascending size.  No fluctuation or crepitation.  There is no drainage or pus.        Vascular: Foot is warm and perfused  Neurological: Decreased  Musculoskeletal Exam: No significant pain on exam today    ASSESSMENT/PLAN OF CARE  POD # 2 s/p right TMA  Left foot cell count within normal, temperature is 99.4. Overall the foot is progressing well.  There is no obvious signs of infection noted otherwise. Continue NWB. Elevation. Continue oral antibiotics. From my standpoint, he is able to be  discharged once cleared by the medical team and PT. I will follow up with him next week in the office and I will arrange follow up. I will see him tomorrow if he is still here.     Please contact me directly with any questions or concerns.     Ovid Curd, DPM [facility]  Dr. Lesia Sago. Andres Richardson, DPM    [address] [place], [place] [address]                Office [phone]  Fax [phone]

## 2023-06-04 NOTE — Progress Notes (Signed)
Physical Therapy Treatment Patient Details Name: Andres Richardson MRN: [id number] DOB: [birth year] Today's Date: 06/04/2023   History of Present Illness 57 y/o male presents [facility] 05/26/23 w/ ulceration on R foot. 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 PMHx: CAD, colostomy in place, HTN, anemia, PAD, T2DM, HFpEF    PT Comments  Pt received in supine and agreeable to session. Pt is able to complete bed mobility and seated tasks without assist. Education provided on donning CAM boot with pt verbalizing understanding. Pt requires min A to stand after unsuccessful attempt and increased time for power up. Pt demonstrates instability during gait trial with RW support requiring intermittent assist to correct LOB and dense cues for technique. Pt demonstrated increased difficulty during turns and quick fatigue. Pt unable to maintain RLE NWB during turns and final few steps of gait trial despite cues. Pt agrees that he would benefit from additional gait practice and strengthening before returning home to ensure safety. After discussion with supervising PT Hilton Cork., equipment recommendations have been updated. Pt continues to benefit from PT services to progress toward functional mobility goals.     If plan is discharge home, recommend the following: A little help with walking and/or transfers;A little help with bathing/dressing/bathroom;Help with stairs or ramp for entrance   Can travel by private vehicle        Equipment Recommendations  Wheelchair (measurements PT);Wheelchair cushion (measurements PT);BSC/3in1    Recommendations for Other Services       Precautions / Restrictions Precautions Precautions: Fall Required Braces or Orthoses: Other Brace Other Brace: R CAM boot with OOB mobility Restrictions Weight Bearing Restrictions: Yes RLE Weight Bearing: Non weight bearing     Mobility  Bed Mobility Overal bed mobility: Modified Independent                   Transfers Overall transfer level: Needs assistance Equipment used: Rolling walker (2 wheels) Transfers: Sit to/from Stand Sit to Stand: Min assist           General transfer comment: Min A from low EOB for anterior weight shift. Cues for hand and RLE placement    Ambulation/Gait Ambulation/Gait assistance: Min assist, +2 safety/equipment Gait Distance (Feet): 12 Feet Assistive device: Rolling walker (2 wheels) Gait Pattern/deviations: Trunk flexed (hop-to)       General Gait Details: Slow hop-to pattern with dense cues for sequencing and RW proximity. Pt demonstrating intermittent posterior and one R lateral LOB requiring assist to correct. Increased difficulty with turns with pt noted to WB through RLE to pivot despite cues. Pt demonstrating quick fatigue and RLE WB during last few steps to EOB despite cues.       Balance Overall balance assessment: Needs assistance Sitting-balance support: Feet supported, No upper extremity supported Sitting balance-Leahy Scale: Good Sitting balance - Comments: sitting EOB   Standing balance support: Bilateral upper extremity supported, During functional activity, Reliant on assistive device for balance Standing balance-Leahy Scale: Poor Standing balance comment: reliant on RW for stability                            Cognition Arousal: Alert Behavior During Therapy: WFL for tasks assessed/performed Overall Cognitive Status: Within Functional Limits for tasks assessed  Exercises      General Comments        Pertinent Vitals/Pain Pain Assessment Pain Assessment: Faces Faces Pain Scale: Hurts a little bit Pain Location: R foot Pain Descriptors / Indicators: Aching, Discomfort Pain Intervention(s): Monitored during session, Repositioned     PT Goals (current goals can now be found in the care plan section) Acute Rehab PT Goals Patient Stated Goal: to go  home PT Goal Formulation: With patient Time For Goal Achievement: 06/17/23 Progress towards PT goals: Progressing toward goals    Frequency    Min 1X/week           Co-evaluation PT/OT/SLP Co-Evaluation/Treatment: Yes Reason for Co-Treatment: To address functional/ADL transfers PT goals addressed during session: Mobility/safety with mobility;Balance;Proper use of DME        AM-PAC PT "6 Clicks" Mobility   Outcome Measure  Help needed turning from your back to your side while in a flat bed without using bedrails?: None Help needed moving from lying on your back to sitting on the side of a flat bed without using bedrails?: None Help needed moving to and from a bed to a chair (including a wheelchair)?: A Little Help needed standing up from a chair using your arms (e.g., wheelchair or bedside chair)?: A Little Help needed to walk in hospital room?: A Lot Help needed climbing 3-5 steps with a railing? : Total 6 Click Score: 17    End of Session Equipment Utilized During Treatment: Gait belt;Other (comment) (RLE CAM boot) Activity Tolerance: Patient limited by fatigue;Patient tolerated treatment well Patient left: in bed;with call bell/phone within reach;with family/visitor present Nurse Communication: Mobility status PT Visit Diagnosis: Unsteadiness on feet (R26.81);Other abnormalities of gait and mobility (R26.89)     Time: 1610-9604 PT Time Calculation (min) (ACUTE ONLY): 53 min  Charges:    $Gait Training: 23-37 mins PT General Charges $$ ACUTE PT VISIT: 1 Visit                     Johny Shock, PTA Acute Rehabilitation Services Secure Chat Preferred  [phone]    Johny Shock 06/04/2023, 3:19 PM

## 2023-06-04 NOTE — Evaluation (Signed)
Occupational Therapy Evaluation Patient Details Name: Andres Richardson MRN: [id number] DOB: 11-Jun-1966 Today's Date: 06/04/2023   History of Present Illness 57 y/o male presents [facility] 05/26/23 w/ ulceration on R foot. 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 PMHx: CAD, colostomy in place, HTN, anemia, PAD, T2DM, HFpEF   Clinical Impression   At baseline, pt completes ADLs, IADLs, and functional mobility with a SPC Independent to Mod I and drives. Pt now presents with decreased activity tolerance, decreased knowledge of precautions, decreased B UE and core strength, and decreased safety and independence with functional tasks. Pt currently demonstrates ability to complete UB ADLs Independent to Supervision, LB ADLs with Set up to Min assist +2, and functional mobility/transfers with a RW with Min assist +2 for safety while following R LE NWB precautions. Pt participated well and is motivated to return to baseline PLOF. Pt will benefit from acute skilled OT services to address deficits outlined below and increase safety and independence with functional tasks. Post acute discharge, pt will benefit from continued skilled OT services in the home to maximize rehab potential.       If plan is discharge home, recommend the following: Two people to help with walking and/or transfers;Two people to help with bathing/dressing/bathroom;Assistance with cooking/housework;Assist for transportation;Help with stairs or ramp for entrance    Functional Status Assessment  Patient has had a recent decline in their functional status and demonstrates the ability to make significant improvements in function in a reasonable and predictable amount of time.  Equipment Recommendations  BSC/3in1;Wheelchair (measurements OT);Wheelchair cushion (measurements OT)    Recommendations for Other Services       Precautions / Restrictions Precautions Precautions: Fall Required Braces or Orthoses: Other  Brace Other Brace: R CAM boot with OOB mobility Restrictions Weight Bearing Restrictions: Yes RLE Weight Bearing: Non weight bearing      Mobility Bed Mobility Overal bed mobility: Modified Independent                  Transfers Overall transfer level: Needs assistance Equipment used: Rolling walker (2 wheels) Transfers: Sit to/from Stand, Bed to chair/wheelchair/BSC Sit to Stand: Min assist     Step pivot transfers: Min assist     General transfer comment: cues for hand  and R LE placement and technique      Balance Overall balance assessment: Needs assistance Sitting-balance support: Feet supported, No upper extremity supported Sitting balance-Leahy Scale: Good Sitting balance - Comments: sitting EOB   Standing balance support: Bilateral upper extremity supported, During functional activity, Reliant on assistive device for balance Standing balance-Leahy Scale: Poor Standing balance comment: reliant on RW for stability                           ADL either performed or assessed with clinical judgement   ADL Overall ADL's : Needs assistance/impaired Eating/Feeding: Independent;Sitting   Grooming: Set up;Sitting   Upper Body Bathing: Supervision/ safety;Sitting   Lower Body Bathing: Supervison/ safety;Sitting/lateral leans Lower Body Bathing Details (indicate cue type and reason): with increased time Upper Body Dressing : Modified independent;Sitting   Lower Body Dressing: Set up;Sitting/lateral leans   Toilet Transfer: Minimal assistance;Rolling walker (2 wheels);Cueing for safety;Regular Toilet;Grab bars;+2 for safety/equipment Toilet Transfer Details (indicate cue type and reason): cues for technique and maintaing R LE NWB Toileting- Clothing Manipulation and Hygiene: Minimal assistance;Sit to/from stand;Sitting/lateral lean;+2 for safety/equipment Toileting - Clothing Manipulation Details (indicate cue type and reason): for urination and  emptying colostomy bag in simulated task Tub/ Shower Transfer: Minimal assistance;+2 for safety/equipment;Shower seat;Grab bars;Rolling walker (2 wheels);Cueing for safety (cues for technique)   Functional mobility during ADLs: Minimal assistance;Rolling walker (2 wheels);Cueing for safety;+2 for safety/equipment (cues for technique and maintaining R LE NWB) General ADL Comments: Pt with decreased actitivty tolerance and fatiguing quickly with functional mobility with a RW.     Vision Baseline Vision/History: 1 Wears glasses Ability to See in Adequate Light: 0 Adequate (with glasses) Patient Visual Report: No change from baseline       Perception         Praxis         Pertinent Vitals/Pain Pain Assessment Pain Assessment: Faces Faces Pain Scale: Hurts a little bit Pain Location: R foot Pain Descriptors / Indicators: Aching, Discomfort Pain Intervention(s): Monitored during session, Repositioned     Extremity/Trunk Assessment Upper Extremity Assessment Upper Extremity Assessment: Right hand dominant;RUE deficits/detail;LUE deficits/detail RUE Deficits / Details: ROM and coordination WNL; overall strength 4/5 LUE Deficits / Details: ROM WFL; coordination WNL; overall strength 4/5   Lower Extremity Assessment Lower Extremity Assessment: Defer to PT evaluation;RLE deficits/detail RLE Deficits / Details: R transmetatarsal amputation on 10/17, NWB to R LE   Cervical / Trunk Assessment Cervical / Trunk Assessment: Normal   Communication Communication Communication: No apparent difficulties   Cognition Arousal: Alert Behavior During Therapy: WFL for tasks assessed/performed Overall Cognitive Status: Within Functional Limits for tasks assessed                                 General Comments: AAOx4 and pleasant throughout.     General Comments  Pt's friend present through most of session    Exercises     Shoulder Instructions      Home Living  Family/patient expects to be discharged to:: Private residence Living Arrangements: Non-relatives/Friends (friend) Available Help at Discharge: Friend(s);Available 24 hours/day Type of Home: House Home Access: Stairs to enter [employer] of Steps: 3 (at one door, no steps at the other entry) Entrance Stairs-Rails: Can reach both;Left;Right Home Layout: Two level;Able to live on main level with bedroom/bathroom Alternate Level Stairs-Number of Steps: flight, pt does not go upstairs at baseline   Bathroom Shower/Tub: Producer, television/film/video: Standard Bathroom Accessibility: Yes How Accessible: Accessible via walker Home Equipment: Shower seat - built [employer] fundraiser (2 wheels);Cane - single point;Grab bars - tub/shower;Hand held shower head          Prior Functioning/Environment Prior Level of Function : Independent/Modified Independent             Mobility Comments: SP cane for short distances, carts for shopping ADLs Comments: independent with ADLs        OT Problem List: Decreased strength;Decreased activity tolerance;Impaired balance (sitting and/or standing);Decreased knowledge of use of DME or AE;Decreased knowledge of precautions      OT Treatment/Interventions: Self-care/ADL training;Therapeutic exercise;Energy conservation;DME and/or AE instruction;Therapeutic activities;Patient/family education;Balance training    OT Goals(Current goals can be found in the care plan section) Acute Rehab OT Goals Patient Stated Goal: to return home and be independent OT Goal Formulation: With patient Time For Goal Achievement: 06/18/23 Potential to Achieve Goals: Good ADL Goals Pt Will Transfer to Toilet: with modified independence;regular height toilet;ambulating (with least restrictive AD; following R LE precautions) Pt Will Perform Toileting - Clothing Manipulation and hygiene: with modified independence;sit to/from stand;sitting/lateral leans (following R  LE precautions)  Pt/caregiver will Perform Home Exercise Program: Increased strength;Both right and left upper extremity;With theraband;With theraputty;Independently;With written HEP provided (increased activity tolerance; increased core strength) Additional ADL Goal #1: Patient will demontrate ability to Independently state 4 energy conservation strategies to increase safety and independence with functional tasks.  OT Frequency: Min 1X/week    Co-evaluation PT/OT/SLP Co-Evaluation/Treatment: Yes Reason for Co-Treatment: To address functional/ADL transfers PT goals addressed during session: Mobility/safety with mobility;Balance;Proper use of DME OT goals addressed during session: ADL's and self-care;Proper use of Adaptive equipment and DME;Strengthening/ROM      AM-PAC OT "6 Clicks" Daily Activity     Outcome Measure Help from another person eating meals?: None Help from another person taking care of personal grooming?: A Little Help from another person toileting, which includes using toliet, bedpan, or urinal?: A Lot Help from another person bathing (including washing, rinsing, drying)?: A Lot Help from another person to put on and taking off regular upper body clothing?: A Little Help from another person to put on and taking off regular lower body clothing?: A Little 6 Click Score: 17   End of Session Equipment Utilized During Treatment: Rolling walker (2 wheels);Gait belt;Other (comment) (R LE CAM boot) Nurse Communication: Mobility status;Other (comment) (Pt needs additional rehab services)  Activity Tolerance: Patient tolerated treatment well;Patient limited by fatigue Patient left: in bed;with call bell/phone within reach;with family/visitor present  OT Visit Diagnosis: Unsteadiness on feet (R26.81);Other abnormalities of gait and mobility (R26.89);Other (comment) (Decreased activity tolerance)                Time: 1308-6578 OT Time Calculation (min): 53 min Charges:  OT General  Charges $OT Visit: 1 Visit OT Evaluation $OT Eval Low Complexity: 1 Low OT Treatments $Self Care/Home Management : 8-22 mins  Eunie Lawn "Orson Eva., OTR/L, MA Acute Rehab [phone]   Lendon Colonel 06/04/2023, 6:10 PM

## 2023-06-04 NOTE — Discharge Summary (Incomplete)
Name: Andres Richardson MRN: [id number] DOB: [birth year] 57 y.o. PCP: Rudene Christians, DO  Date of Admission: 05/26/2023  1:44 PM Date of Discharge: 06/04/2023 Attending Physician: Inez Catalina, MD  Discharge Diagnosis: Principal Problem:   Osteomyelitis of right lower extremity [facility]) Active Problems:   CAD (coronary artery disease)   Colostomy in place [facility])   Essential hypertension   Anemia   Elevated serum creatinine   PAD (peripheral artery disease) (HCC)   Diabetes (HCC)   Aortic atherosclerosis (HCC)   Discharge Medications: Allergies as of 06/04/2023       Reactions   Adhesive [tape] Rash        Medication List     STOP taking these medications    insulin glargine 100 UNIT/ML Solostar Pen Commonly known as: LANTUS   insulin lispro 100 UNIT/ML KwikPen Commonly known as: HumaLOG KwikPen Replaced by: [place]ie [place] FlexTouch 100 UNIT/ML FlexTouch Pen   olmesartan 20 MG tablet Commonly known as: BENICAR   potassium chloride 10 MEQ tablet Commonly known as: KLOR-CON M       TAKE these medications    acetaminophen 500 MG tablet Commonly known as: TYLENOL Take 1,000-1,500 mg by mouth 2 (two) times daily as needed for moderate pain.   amLODipine 5 MG tablet Commonly known as: NORVASC Take 1 tablet (5 mg total) by mouth daily.   amoxicillin-clavulanate 875-125 MG tablet Commonly known as: AUGMENTIN Take 1 tablet by mouth 2 (two) times daily for 6 days.   aspirin EC 81 MG tablet Take 1 tablet (81 mg total) by mouth daily. Swallow whole.   BD Pen Needle Nano U/F 32G X 4 MM Misc Generic drug: Insulin Pen Needle 1 Units by Does not apply route 4 (four) times daily.   Blood Glucose Monitoring Suppl Devi May substitute to any manufacturer covered by patient's insurance.   BLOOD GLUCOSE TEST STRIPS Strp 1 each by In Vitro route in the morning, at noon, and at bedtime. May substitute to any manufacturer covered by patient's insurance.   calcium carbonate  500 MG chewable tablet Commonly known as: TUMS - dosed in mg elemental calcium Chew 1 tablet by mouth at bedtime.   clopidogrel 75 MG tablet Commonly known as: PLAVIX Take 1 tablet (75 mg total) by mouth daily with breakfast. Start taking on: June 05, 2023   Dexcom G7 Sensor Misc USE AS DIRECTED ONCE A WEEK   doxycycline 100 MG tablet Commonly known as: VIBRA-TABS Take 1 tablet (100 mg total) by mouth every 12 (twelve) hours for 6 days.   DULoxetine 60 MG capsule Commonly known as: Cymbalta Take 1 capsule (60 mg total) by mouth daily.   ezetimibe 10 MG tablet Commonly known as: ZETIA Take 1 tablet (10 mg total) by mouth daily. Start taking on: June 05, 2023   Fiasp FlexTouch 100 UNIT/ML FlexTouch Pen Generic drug: insulin aspart Inject 5 Units into the skin in the morning, at noon, and at bedtime. Replaces: insulin lispro 100 UNIT/ML KwikPen   furosemide 40 MG tablet Commonly known as: LASIX Take 1 tablet (40 mg total) by mouth daily.   gabapentin 100 MG capsule Commonly known as: NEURONTIN Take 2 capsules (200 mg total) by mouth 3 (three) times daily.   gentamicin ointment 0.1 % Commonly known as: GARAMYCIN Apply 1 Application topically daily.   Gvoke HypoPen 1-Pack 1 MG/0.2ML Soaj Generic drug: Glucagon Inject 1 mg into the skin as needed (low blood sugar with impaired consciousness).   Iron (Ferrous Sulfate) 325 (  Name: Andres Richardson MRN: [id number] DOB: [birth year] 57 y.o. PCP: Rudene Christians, DO  Date of Admission: 05/26/2023  1:44 PM Date of Discharge: 06/04/2023 Attending Physician: Inez Catalina, MD  Discharge Diagnosis: Principal Problem:   Osteomyelitis of right lower extremity [facility]) Active Problems:   CAD (coronary artery disease)   Colostomy in place [facility])   Essential hypertension   Anemia   Elevated serum creatinine   PAD (peripheral artery disease) (HCC)   Diabetes (HCC)   Aortic atherosclerosis (HCC)   Discharge Medications: Allergies as of 06/04/2023       Reactions   Adhesive [tape] Rash        Medication List     STOP taking these medications    insulin glargine 100 UNIT/ML Solostar Pen Commonly known as: LANTUS   insulin lispro 100 UNIT/ML KwikPen Commonly known as: HumaLOG KwikPen Replaced by: Candie Mile FlexTouch 100 UNIT/ML FlexTouch Pen   olmesartan 20 MG tablet Commonly known as: BENICAR   potassium chloride 10 MEQ tablet Commonly known as: KLOR-CON M       TAKE these medications    acetaminophen 500 MG tablet Commonly known as: TYLENOL Take 1,000-1,500 mg by mouth 2 (two) times daily as needed for moderate pain.   amLODipine 5 MG tablet Commonly known as: NORVASC Take 1 tablet (5 mg total) by mouth daily.   amoxicillin-clavulanate 875-125 MG tablet Commonly known as: AUGMENTIN Take 1 tablet by mouth 2 (two) times daily for 6 days.   aspirin EC 81 MG tablet Take 1 tablet (81 mg total) by mouth daily. Swallow whole.   BD Pen Needle Nano U/F 32G X 4 MM Misc Generic drug: Insulin Pen Needle 1 Units by Does not apply route 4 (four) times daily.   Blood Glucose Monitoring Suppl Devi May substitute to any manufacturer covered by patient's insurance.   BLOOD GLUCOSE TEST STRIPS Strp 1 each by In Vitro route in the morning, at noon, and at bedtime. May substitute to any manufacturer covered by patient's insurance.   calcium carbonate  500 MG chewable tablet Commonly known as: TUMS - dosed in mg elemental calcium Chew 1 tablet by mouth at bedtime.   clopidogrel 75 MG tablet Commonly known as: PLAVIX Take 1 tablet (75 mg total) by mouth daily with breakfast. Start taking on: June 05, 2023   Dexcom G7 Sensor Misc USE AS DIRECTED ONCE A WEEK   doxycycline 100 MG tablet Commonly known as: VIBRA-TABS Take 1 tablet (100 mg total) by mouth every 12 (twelve) hours for 6 days.   DULoxetine 60 MG capsule Commonly known as: Cymbalta Take 1 capsule (60 mg total) by mouth daily.   ezetimibe 10 MG tablet Commonly known as: ZETIA Take 1 tablet (10 mg total) by mouth daily. Start taking on: June 05, 2023   Fiasp FlexTouch 100 UNIT/ML FlexTouch Pen Generic drug: insulin aspart Inject 5 Units into the skin in the morning, at noon, and at bedtime. Replaces: insulin lispro 100 UNIT/ML KwikPen   furosemide 40 MG tablet Commonly known as: LASIX Take 1 tablet (40 mg total) by mouth daily.   gabapentin 100 MG capsule Commonly known as: NEURONTIN Take 2 capsules (200 mg total) by mouth 3 (three) times daily.   gentamicin ointment 0.1 % Commonly known as: GARAMYCIN Apply 1 Application topically daily.   Gvoke HypoPen 1-Pack 1 MG/0.2ML Soaj Generic drug: Glucagon Inject 1 mg into the skin as needed (low blood sugar with impaired consciousness).   Iron (Ferrous Sulfate) 325 (  large ulceration overlying the distal fifth metatarsal and fifth MTP joint with abnormal subcutaneous tissues in this vicinity and some speckled gas densities in the soft tissues extending towards the regions of fifth digit bony destruction. Moderate dorsal subcutaneous edema in the forefoot, cellulitis not excluded. IMPRESSION: 1. Osteomyelitis of the fifth metatarsal and phalanges of  the fifth toe with bony destructive findings in the head and distal metaphysis of the fifth metatarsal and proximally in the fifth toe. Overlying ulceration with gas tracking over adjacent to the head of the fifth metatarsal and fifth MTP joint. 2. There may be some slight reactive endosteal edema in the fourth metatarsal and fourth toe proximal phalanx but without compelling evidence of fourth digit osteomyelitis at this time. 3. Edema medially in the head of the first metatarsal probably from arthropathy such as gout. 4. Diffuse low-grade atrophy and edema in the regional musculature is likely neurogenic. 5. Moderate dorsal subcutaneous edema in the forefoot, cellulitis not excluded. Electronically Signed: By: Gaylyn Rong M.D. On: 05/26/2023 20:56   VAS [name] ABI WITH/WO TBI  Result Date: 05/27/2023  LOWER EXTREMITY DOPPLER STUDY Patient Name:  KORVER GALDI  Date of Exam:   05/27/2023 Medical Rec #: [id number]        Accession #:    [id number] Date of Birth: May 21, 1966        Patient Gender: M Patient Age:   26 years Exam Location:  [facility] Procedure:      VAS [name] ABI WITH/WO TBI Referring Phys: Heath Lark --------------------------------------------------------------------------------  Indications: DM ulceration / osteomyelitis High Risk Factors: Hypertension, hyperlipidemia, Diabetes, current smoker,                    coronary artery disease. Other Factors: CHF, CKD.  Comparison Study: Previous exam 01/21/23 Performing Technologist: Ernestene Mention RVT/RDMS  Examination Guidelines: A complete evaluation includes at minimum, Doppler waveform signals and systolic blood pressure reading at the level of bilateral brachial, anterior tibial, and posterior tibial arteries, when vessel segments are accessible. Bilateral testing is considered an integral part of a complete examination. Photoelectric Plethysmograph (PPG) waveforms and toe systolic pressure readings are included as required and additional  duplex testing as needed. Limited examinations for reoccurring indications may be performed as noted.  ABI Findings: +---------+------------------+-----+----------+--------+ Right    Rt Pressure (mmHg)IndexWaveform  Comment  +---------+------------------+-----+----------+--------+ Brachial 127                    triphasic          +---------+------------------+-----+----------+--------+ PTA      72                0.57 monophasic         +---------+------------------+-----+----------+--------+ DP       57                0.45 monophasic         +---------+------------------+-----+----------+--------+ Great Toe23                0.18 Abnormal           +---------+------------------+-----+----------+--------+ +---------+------------------+-----+----------+-------+ Left     Lt Pressure (mmHg)IndexWaveform  Comment +---------+------------------+-----+----------+-------+ Brachial 123                    biphasic          +---------+------------------+-----+----------+-------+ PTA      68  large ulceration overlying the distal fifth metatarsal and fifth MTP joint with abnormal subcutaneous tissues in this vicinity and some speckled gas densities in the soft tissues extending towards the regions of fifth digit bony destruction. Moderate dorsal subcutaneous edema in the forefoot, cellulitis not excluded. IMPRESSION: 1. Osteomyelitis of the fifth metatarsal and phalanges of  the fifth toe with bony destructive findings in the head and distal metaphysis of the fifth metatarsal and proximally in the fifth toe. Overlying ulceration with gas tracking over adjacent to the head of the fifth metatarsal and fifth MTP joint. 2. There may be some slight reactive endosteal edema in the fourth metatarsal and fourth toe proximal phalanx but without compelling evidence of fourth digit osteomyelitis at this time. 3. Edema medially in the head of the first metatarsal probably from arthropathy such as gout. 4. Diffuse low-grade atrophy and edema in the regional musculature is likely neurogenic. 5. Moderate dorsal subcutaneous edema in the forefoot, cellulitis not excluded. Electronically Signed: By: Gaylyn Rong M.D. On: 05/26/2023 20:56   VAS [name] ABI WITH/WO TBI  Result Date: 05/27/2023  LOWER EXTREMITY DOPPLER STUDY Patient Name:  KORVER GALDI  Date of Exam:   05/27/2023 Medical Rec #: [id number]        Accession #:    [id number] Date of Birth: May 21, 1966        Patient Gender: M Patient Age:   26 years Exam Location:  [facility] Procedure:      VAS [name] ABI WITH/WO TBI Referring Phys: Heath Lark --------------------------------------------------------------------------------  Indications: DM ulceration / osteomyelitis High Risk Factors: Hypertension, hyperlipidemia, Diabetes, current smoker,                    coronary artery disease. Other Factors: CHF, CKD.  Comparison Study: Previous exam 01/21/23 Performing Technologist: Ernestene Mention RVT/RDMS  Examination Guidelines: A complete evaluation includes at minimum, Doppler waveform signals and systolic blood pressure reading at the level of bilateral brachial, anterior tibial, and posterior tibial arteries, when vessel segments are accessible. Bilateral testing is considered an integral part of a complete examination. Photoelectric Plethysmograph (PPG) waveforms and toe systolic pressure readings are included as required and additional  duplex testing as needed. Limited examinations for reoccurring indications may be performed as noted.  ABI Findings: +---------+------------------+-----+----------+--------+ Right    Rt Pressure (mmHg)IndexWaveform  Comment  +---------+------------------+-----+----------+--------+ Brachial 127                    triphasic          +---------+------------------+-----+----------+--------+ PTA      72                0.57 monophasic         +---------+------------------+-----+----------+--------+ DP       57                0.45 monophasic         +---------+------------------+-----+----------+--------+ Great Toe23                0.18 Abnormal           +---------+------------------+-----+----------+--------+ +---------+------------------+-----+----------+-------+ Left     Lt Pressure (mmHg)IndexWaveform  Comment +---------+------------------+-----+----------+-------+ Brachial 123                    biphasic          +---------+------------------+-----+----------+-------+ PTA      68  large ulceration overlying the distal fifth metatarsal and fifth MTP joint with abnormal subcutaneous tissues in this vicinity and some speckled gas densities in the soft tissues extending towards the regions of fifth digit bony destruction. Moderate dorsal subcutaneous edema in the forefoot, cellulitis not excluded. IMPRESSION: 1. Osteomyelitis of the fifth metatarsal and phalanges of  the fifth toe with bony destructive findings in the head and distal metaphysis of the fifth metatarsal and proximally in the fifth toe. Overlying ulceration with gas tracking over adjacent to the head of the fifth metatarsal and fifth MTP joint. 2. There may be some slight reactive endosteal edema in the fourth metatarsal and fourth toe proximal phalanx but without compelling evidence of fourth digit osteomyelitis at this time. 3. Edema medially in the head of the first metatarsal probably from arthropathy such as gout. 4. Diffuse low-grade atrophy and edema in the regional musculature is likely neurogenic. 5. Moderate dorsal subcutaneous edema in the forefoot, cellulitis not excluded. Electronically Signed: By: Gaylyn Rong M.D. On: 05/26/2023 20:56   VAS [name] ABI WITH/WO TBI  Result Date: 05/27/2023  LOWER EXTREMITY DOPPLER STUDY Patient Name:  KORVER GALDI  Date of Exam:   05/27/2023 Medical Rec #: [id number]        Accession #:    [id number] Date of Birth: May 21, 1966        Patient Gender: M Patient Age:   26 years Exam Location:  [facility] Procedure:      VAS [name] ABI WITH/WO TBI Referring Phys: Heath Lark --------------------------------------------------------------------------------  Indications: DM ulceration / osteomyelitis High Risk Factors: Hypertension, hyperlipidemia, Diabetes, current smoker,                    coronary artery disease. Other Factors: CHF, CKD.  Comparison Study: Previous exam 01/21/23 Performing Technologist: Ernestene Mention RVT/RDMS  Examination Guidelines: A complete evaluation includes at minimum, Doppler waveform signals and systolic blood pressure reading at the level of bilateral brachial, anterior tibial, and posterior tibial arteries, when vessel segments are accessible. Bilateral testing is considered an integral part of a complete examination. Photoelectric Plethysmograph (PPG) waveforms and toe systolic pressure readings are included as required and additional  duplex testing as needed. Limited examinations for reoccurring indications may be performed as noted.  ABI Findings: +---------+------------------+-----+----------+--------+ Right    Rt Pressure (mmHg)IndexWaveform  Comment  +---------+------------------+-----+----------+--------+ Brachial 127                    triphasic          +---------+------------------+-----+----------+--------+ PTA      72                0.57 monophasic         +---------+------------------+-----+----------+--------+ DP       57                0.45 monophasic         +---------+------------------+-----+----------+--------+ Great Toe23                0.18 Abnormal           +---------+------------------+-----+----------+--------+ +---------+------------------+-----+----------+-------+ Left     Lt Pressure (mmHg)IndexWaveform  Comment +---------+------------------+-----+----------+-------+ Brachial 123                    biphasic          +---------+------------------+-----+----------+-------+ PTA      68  large ulceration overlying the distal fifth metatarsal and fifth MTP joint with abnormal subcutaneous tissues in this vicinity and some speckled gas densities in the soft tissues extending towards the regions of fifth digit bony destruction. Moderate dorsal subcutaneous edema in the forefoot, cellulitis not excluded. IMPRESSION: 1. Osteomyelitis of the fifth metatarsal and phalanges of  the fifth toe with bony destructive findings in the head and distal metaphysis of the fifth metatarsal and proximally in the fifth toe. Overlying ulceration with gas tracking over adjacent to the head of the fifth metatarsal and fifth MTP joint. 2. There may be some slight reactive endosteal edema in the fourth metatarsal and fourth toe proximal phalanx but without compelling evidence of fourth digit osteomyelitis at this time. 3. Edema medially in the head of the first metatarsal probably from arthropathy such as gout. 4. Diffuse low-grade atrophy and edema in the regional musculature is likely neurogenic. 5. Moderate dorsal subcutaneous edema in the forefoot, cellulitis not excluded. Electronically Signed: By: Gaylyn Rong M.D. On: 05/26/2023 20:56   VAS [name] ABI WITH/WO TBI  Result Date: 05/27/2023  LOWER EXTREMITY DOPPLER STUDY Patient Name:  KORVER GALDI  Date of Exam:   05/27/2023 Medical Rec #: [id number]        Accession #:    [id number] Date of Birth: May 21, 1966        Patient Gender: M Patient Age:   26 years Exam Location:  [facility] Procedure:      VAS [name] ABI WITH/WO TBI Referring Phys: Heath Lark --------------------------------------------------------------------------------  Indications: DM ulceration / osteomyelitis High Risk Factors: Hypertension, hyperlipidemia, Diabetes, current smoker,                    coronary artery disease. Other Factors: CHF, CKD.  Comparison Study: Previous exam 01/21/23 Performing Technologist: Ernestene Mention RVT/RDMS  Examination Guidelines: A complete evaluation includes at minimum, Doppler waveform signals and systolic blood pressure reading at the level of bilateral brachial, anterior tibial, and posterior tibial arteries, when vessel segments are accessible. Bilateral testing is considered an integral part of a complete examination. Photoelectric Plethysmograph (PPG) waveforms and toe systolic pressure readings are included as required and additional  duplex testing as needed. Limited examinations for reoccurring indications may be performed as noted.  ABI Findings: +---------+------------------+-----+----------+--------+ Right    Rt Pressure (mmHg)IndexWaveform  Comment  +---------+------------------+-----+----------+--------+ Brachial 127                    triphasic          +---------+------------------+-----+----------+--------+ PTA      72                0.57 monophasic         +---------+------------------+-----+----------+--------+ DP       57                0.45 monophasic         +---------+------------------+-----+----------+--------+ Great Toe23                0.18 Abnormal           +---------+------------------+-----+----------+--------+ +---------+------------------+-----+----------+-------+ Left     Lt Pressure (mmHg)IndexWaveform  Comment +---------+------------------+-----+----------+-------+ Brachial 123                    biphasic          +---------+------------------+-----+----------+-------+ PTA      68  large ulceration overlying the distal fifth metatarsal and fifth MTP joint with abnormal subcutaneous tissues in this vicinity and some speckled gas densities in the soft tissues extending towards the regions of fifth digit bony destruction. Moderate dorsal subcutaneous edema in the forefoot, cellulitis not excluded. IMPRESSION: 1. Osteomyelitis of the fifth metatarsal and phalanges of  the fifth toe with bony destructive findings in the head and distal metaphysis of the fifth metatarsal and proximally in the fifth toe. Overlying ulceration with gas tracking over adjacent to the head of the fifth metatarsal and fifth MTP joint. 2. There may be some slight reactive endosteal edema in the fourth metatarsal and fourth toe proximal phalanx but without compelling evidence of fourth digit osteomyelitis at this time. 3. Edema medially in the head of the first metatarsal probably from arthropathy such as gout. 4. Diffuse low-grade atrophy and edema in the regional musculature is likely neurogenic. 5. Moderate dorsal subcutaneous edema in the forefoot, cellulitis not excluded. Electronically Signed: By: Gaylyn Rong M.D. On: 05/26/2023 20:56   VAS [name] ABI WITH/WO TBI  Result Date: 05/27/2023  LOWER EXTREMITY DOPPLER STUDY Patient Name:  KORVER GALDI  Date of Exam:   05/27/2023 Medical Rec #: [id number]        Accession #:    [id number] Date of Birth: May 21, 1966        Patient Gender: M Patient Age:   26 years Exam Location:  [facility] Procedure:      VAS [name] ABI WITH/WO TBI Referring Phys: Heath Lark --------------------------------------------------------------------------------  Indications: DM ulceration / osteomyelitis High Risk Factors: Hypertension, hyperlipidemia, Diabetes, current smoker,                    coronary artery disease. Other Factors: CHF, CKD.  Comparison Study: Previous exam 01/21/23 Performing Technologist: Ernestene Mention RVT/RDMS  Examination Guidelines: A complete evaluation includes at minimum, Doppler waveform signals and systolic blood pressure reading at the level of bilateral brachial, anterior tibial, and posterior tibial arteries, when vessel segments are accessible. Bilateral testing is considered an integral part of a complete examination. Photoelectric Plethysmograph (PPG) waveforms and toe systolic pressure readings are included as required and additional  duplex testing as needed. Limited examinations for reoccurring indications may be performed as noted.  ABI Findings: +---------+------------------+-----+----------+--------+ Right    Rt Pressure (mmHg)IndexWaveform  Comment  +---------+------------------+-----+----------+--------+ Brachial 127                    triphasic          +---------+------------------+-----+----------+--------+ PTA      72                0.57 monophasic         +---------+------------------+-----+----------+--------+ DP       57                0.45 monophasic         +---------+------------------+-----+----------+--------+ Great Toe23                0.18 Abnormal           +---------+------------------+-----+----------+--------+ +---------+------------------+-----+----------+-------+ Left     Lt Pressure (mmHg)IndexWaveform  Comment +---------+------------------+-----+----------+-------+ Brachial 123                    biphasic          +---------+------------------+-----+----------+-------+ PTA      68

## 2023-06-05 DIAGNOSIS — E1169 Type 2 diabetes mellitus with other specified complication: Principal | ICD-10-CM

## 2023-06-05 LAB — BASIC METABOLIC PANEL
Anion gap: 10 (ref 5–15)
BUN: 21 mg/dL — ABNORMAL HIGH (ref 6–20)
CO2: 16 mmol/L — ABNORMAL LOW (ref 22–32)
Calcium: 8.1 mg/dL — ABNORMAL LOW (ref 8.9–10.3)
Chloride: 112 mmol/L — ABNORMAL HIGH (ref 98–111)
Creatinine, Ser: 1.46 mg/dL — ABNORMAL HIGH (ref 0.61–1.24)
GFR, Estimated: 56 mL/min — ABNORMAL LOW (ref >60)
Glucose, Bld: 125 mg/dL — ABNORMAL HIGH (ref 70–99)
Potassium: 4.7 mmol/L (ref 3.5–5.1)
Sodium: 138 mmol/L (ref 135–145)

## 2023-06-05 LAB — CBC
HCT: 22.4 % — ABNORMAL LOW (ref 39.0–52.0)
Hemoglobin: 7.1 g/dL — ABNORMAL LOW (ref 13.0–17.0)
MCH: 26.5 pg (ref 26.0–34.0)
MCHC: 31.7 g/dL (ref 30.0–36.0)
MCV: 83.6 fL (ref 80.0–100.0)
Platelets: 238 10*3/uL (ref 150–400)
RBC: 2.68 MIL/uL — ABNORMAL LOW (ref 4.22–5.81)
RDW: 15.9 % — ABNORMAL HIGH (ref 11.5–15.5)
WBC: 6.4 10*3/uL (ref 4.0–10.5)
nRBC: 0 % (ref 0.0–0.2)

## 2023-06-05 LAB — GLUCOSE, CAPILLARY
Glucose-Capillary: 118 mg/dL — ABNORMAL HIGH (ref 70–99)
Glucose-Capillary: 117 mg/dL — ABNORMAL HIGH (ref 70–99)
Glucose-Capillary: 166 mg/dL — ABNORMAL HIGH (ref 70–99)
Glucose-Capillary: 204 mg/dL — ABNORMAL HIGH (ref 70–99)

## 2023-06-05 LAB — MAGNESIUM: Magnesium: 2 mg/dL (ref 1.7–2.4)

## 2023-06-05 NOTE — Progress Notes (Addendum)
Mobility Specialist Progress Note:   06/05/23 1251  Mobility  Activity Ambulated with assistance in room  Level of Assistance Minimal assist, patient does 75% or more  Assistive Device Front wheel walker  Distance Ambulated (ft) 20 ft  RLE Weight Bearing NWB  Activity Response Tolerated well  Mobility Referral Yes  $Mobility charge 1 Mobility  Mobility Specialist Start Time (ACUTE ONLY) 1205  Mobility Specialist Stop Time (ACUTE ONLY) 1220  Mobility Specialist Time Calculation (min) (ACUTE ONLY) 15 min   Pre Mobility: 93 HR  During Mobility: 101 HR   Pt received in bed, agreeable to mobility. Pt needing MinA to stand from elevated bed height. CG during ambulation. Pt able to follow WB precautions while hopping to BR door from bedside. Pt display steady balance on LLE while pivoting. Seated rest break required d/t general fatigue, otherwise asymptomatic throughout. Pt returned to bed with NT present in room.    Leory Plowman  Mobility Specialist Please contact via [employer] office at [phone]

## 2023-06-05 NOTE — Progress Notes (Signed)
PODIATRY PROGRESS NOTE  NAME Andres Richardson MRN [id number] DOB [birth year] DOA 05/26/2023   Reason for consult:  Chief Complaint  Patient presents with   Diabetic Ulcer     History of present illness: 57 y.o. male POD# 3 s/p right TMA.  Denies any pain. States working with PT was better today. No fevers or chills.   Vitals:   06/05/23 0918 06/05/23 1225  BP: 115/61 138/65  Pulse: 84 83  Resp: 18 18  Temp: 97.9 F (36.6 C) 98.1 F (36.7 C)  SpO2:       CBC    Component Value Date/Time   WBC 6.4 06/05/2023 1436   RBC 2.68 (L) 06/05/2023 1436   HGB 7.1 (L) 06/05/2023 1436   HGB 11.6 (L) 03/16/2023 1636   HCT 22.4 (L) 06/05/2023 1436   HCT 33.8 (L) 03/16/2023 1636   PLT 238 06/05/2023 1436   PLT 240 03/16/2023 1636   MCV 83.6 06/05/2023 1436   MCV 85 03/16/2023 1636   MCH 26.5 06/05/2023 1436   MCHC 31.7 06/05/2023 1436   RDW 15.9 (H) 06/05/2023 1436   RDW 14.0 03/16/2023 1636   LYMPHSABS 1.0 05/31/2023 0624   MONOABS 0.4 05/31/2023 0624   EOSABS 0.3 05/31/2023 0624   BASOSABS 0.1 05/31/2023 0624        Latest Ref Rng & Units 06/05/2023   12:59 PM 06/04/2023    5:54 AM 06/03/2023    6:39 AM  BMP  Glucose 70 - 99 mg/dL 938  80  101   BUN 6 - 20 mg/dL 21  21  25    Creatinine 0.61 - 1.24 mg/dL 7.51  0.25  8.52   Sodium 135 - 145 mmol/L 138  139  137   Potassium 3.5 - 5.1 mmol/L 4.7  3.5  3.8   Chloride 98 - 111 mmol/L 112  107  111   CO2 22 - 32 mmol/L 16  18  19    Calcium 8.9 - 10.3 mg/dL 8.1  8.9  7.7         Physical Exam: General: AOx3, NAD  Dermatology: Dressing clean, dry, intact without any strikethrough.  No erythema to the leg.        Vascular: Foot is warm and perfused.  Calf is supple  Neurological: Decreased  Musculoskeletal Exam: No significant pain on exam today    ASSESSMENT/PLAN OF CARE  POD # 3 s/p right TMA  Left foot cell count within normal, temperature is 98.1. No pain.  Will leave the dressing intact until  follow-up.  Will see him later this week.  Stable for discharge from ICU point once cleared by physical therapy, medicine.  Will arrange follow-up.    Please contact me directly with any questions or concerns.     Ovid Curd, DPM [facility]  Dr. Lesia Sago. Jullianna Gabor, DPM    [address] [place], [place] [address]                Office [phone]  Fax [phone]

## 2023-06-05 NOTE — Progress Notes (Signed)
Overview: Andres Richardson is a 57 y.o. person living with a history of T2DM, HTN, PAD, HFpEF who presented with right foot ulcers and admitted for osteomyelitis.   Overnight: NAEON  Subjective: States doing well. States he is ready to go home and feels he can move around safely.    Objective: afebrile, normotensive, satting well on RA.  Vital signs in last 24 hours: Vitals:   06/04/23 0348 06/04/23 1223 06/04/23 1938 06/05/23 0409  BP: (!) 133/46 (!) 110/47 (!) 118/45 128/63  Pulse: 85 93 91 88  Resp: 16 17 16 19   Temp: 99.4 F (37.4 C) 98 F (36.7 C) 98.7 F (37.1 C) 99.1 F (37.3 C)  TempSrc: Oral Oral Oral Oral  SpO2: 97% 98% 96% 98%  Weight: 91.7 kg   92 kg  Height:       Supplemental O2: Room Air Last BM Date : 06/03/23 SpO2: 98 % Filed Weights   06/03/23 0404 06/04/23 0348 06/05/23 0409  Weight: 94.5 kg 91.7 kg 92 kg    Intake/Output Summary (Last 24 hours) at 06/05/2023 0617 Last data filed at 06/05/2023 0411 Gross per 24 hour  Intake --  Output 2125 ml  Net -2125 ml   Net IO Since Admission: -9,941.75 mL [06/05/23 0617]  Physical Exam General: NAD, laying in bed HENT: NCAT Lungs: CTAB Cardiovascular: RRR, good radial pulses  Abdomen: No TTP of abdomen, bowel sounds present MSK: No asymmetry Skin: no lesions on skin, right lower extremity with bandage on. Neuro: alert and oriented x4 Psych: pleasant mood and affect  Diagnostics    Latest Ref Rng & Units 06/04/2023    5:54 AM 06/03/2023    6:39 AM 06/02/2023    3:55 AM  CBC  WBC 4.0 - 10.5 K/uL 7.5  6.7  5.2   Hemoglobin 13.0 - 17.0 g/dL 7.5  7.5  9.0   Hematocrit 39.0 - 52.0 % 23.5  24.1  28.1   Platelets 150 - 400 K/uL 225  225  262        Latest Ref Rng & Units 06/04/2023    5:54 AM 06/03/2023    6:39 AM 06/02/2023    3:55 AM  BMP  Glucose 70 - 99 mg/dL 80  161  096   BUN 6 - 20 mg/dL 21  25  24    Creatinine 0.61 - 1.24 mg/dL 0.45  4.09  8.11   Sodium 135 - 145 mmol/L 139  137   139   Potassium 3.5 - 5.1 mmol/L 3.5  3.8  3.7   Chloride 98 - 111 mmol/L 107  111  108   CO2 22 - 32 mmol/L 18  19  21    Calcium 8.9 - 10.3 mg/dL 8.9  7.7  8.5    CBC and BMP pending for 06/05/23  Iron Studies show iron 10, saturation at 4 %. Ferritin normal at 216  Assessment/Plan: Principal Problem:   Osteomyelitis of right lower extremity (HCC) Active Problems:   CAD (coronary artery disease)   Colostomy in place [facility])   Essential hypertension   Anemia   Elevated serum creatinine   PAD (peripheral artery disease) (HCC)   Diabetes (HCC)   Aortic atherosclerosis (HCC)  Medial/lateral right foot ulcers c/b fifth toe and first MP osteomyelitis Appreciate Vascular and Podiatry's involvement. S/p angiography 10/16. Transmetatarsal amputation 10/17. Transition to PO antibiotics for 7 day course. Follow up with podiatry OP. Medically stable for discharge today pending PT/OT evaluation given new partial amputation.  Cw ASA Continue PO Augmentin and Doxycycline Last day 10/23.  For Pain:  Tylenol 1000mg  q6hrs  Oxycodone q4hrs PRN for breakthrough pain.    Hyperlipidemia PAD/CAD S/p revascularization, treatment of 99% SFA lesion with drug-eluting stent.. Extremities have palpable pulses, are well perfused. Motion and sensation intact.  Cw rosuvastatin 40mg  daily and Zetia 10mg  daily.  May benefit from exercise plan OP  and complete tobacco cessation FU with Vascular outpatient. Will need follow up in 4-6 wks with ABIs and RLE arterial duplex   T2DM c/b neuropathy Fasting glucose was 118 this AM. Will keep him at 60 units at bedtime today.    Normocytic Anemia 2/2 to ABLA  Hgb is in the 8-9 range. Continue monitoring with CBCs. Iron studies show iron deficiency anemia.   HFpEF HTN Chronic. Patient has HFpEF and is on Lasix 40 mg daily. Lasix restarted yesterday.  GERD Will continue pt's protonix 40 mg every day.   Diet: Carb-Modified IVF: None,None VTE: Enoxaparin Code:  Full PT/OT recs: Pending Prior to Admission Living Arrangement: Home Anticipated Discharge Location: Home Barriers to Discharge: Medical Stability Dispo: Anticipated discharge in approximately 1-2 day(s).   Gwenevere Abbot, MD [name]. [facility] Internal Medicine Residency, PGY-3  Pager: [phone] After 5 pm and on weekends: Please call the on-call pager

## 2023-06-05 NOTE — Progress Notes (Signed)
Patient with new imminent discharge order placed 10/20.  Patient already assessed by OT 10/19 with [facility] OT recommendations placed.  See note for details.  OT will continue efforts in the acute setting to address orders.    06/05/2023  RP, OTR/L  Acute Rehabilitation Services  Office:  [phone]

## 2023-06-06 ENCOUNTER — Other Ambulatory Visit (HOSPITAL_COMMUNITY): Payer: Self-pay

## 2023-06-06 DIAGNOSIS — L97509 Non-pressure chronic ulcer of other part of unspecified foot with unspecified severity: Secondary | ICD-10-CM | POA: Diagnosis not present

## 2023-06-06 DIAGNOSIS — E11621 Type 2 diabetes mellitus with foot ulcer: Secondary | ICD-10-CM | POA: Diagnosis not present

## 2023-06-06 DIAGNOSIS — M869 Osteomyelitis, unspecified: Secondary | ICD-10-CM | POA: Diagnosis not present

## 2023-06-06 LAB — RENAL FUNCTION PANEL
Albumin: 1.9 g/dL — ABNORMAL LOW (ref 3.5–5.0)
Anion gap: 5 (ref 5–15)
BUN: 21 mg/dL — ABNORMAL HIGH (ref 6–20)
CO2: 19 mmol/L — ABNORMAL LOW (ref 22–32)
Calcium: 7.8 mg/dL — ABNORMAL LOW (ref 8.9–10.3)
Chloride: 113 mmol/L — ABNORMAL HIGH (ref 98–111)
Creatinine, Ser: 1.38 mg/dL — ABNORMAL HIGH (ref 0.61–1.24)
GFR, Estimated: 60 mL/min — ABNORMAL LOW (ref >60)
Glucose, Bld: 154 mg/dL — ABNORMAL HIGH (ref 70–99)
Phosphorus: 3.4 mg/dL (ref 2.5–4.6)
Potassium: 3.9 mmol/L (ref 3.5–5.1)
Sodium: 137 mmol/L (ref 135–145)

## 2023-06-06 LAB — CBC
HCT: 23 % — ABNORMAL LOW (ref 39.0–52.0)
Hemoglobin: 7.2 g/dL — ABNORMAL LOW (ref 13.0–17.0)
MCH: 26.7 pg (ref 26.0–34.0)
MCHC: 31.3 g/dL (ref 30.0–36.0)
MCV: 85.2 fL (ref 80.0–100.0)
Platelets: 257 10*3/uL (ref 150–400)
RBC: 2.7 MIL/uL — ABNORMAL LOW (ref 4.22–5.81)
RDW: 15.9 % — ABNORMAL HIGH (ref 11.5–15.5)
WBC: 5.4 10*3/uL (ref 4.0–10.5)
nRBC: 0 % (ref 0.0–0.2)

## 2023-06-06 LAB — SURGICAL PATHOLOGY

## 2023-06-06 LAB — GLUCOSE, CAPILLARY
Glucose-Capillary: 100 mg/dL — ABNORMAL HIGH (ref 70–99)
Glucose-Capillary: 164 mg/dL — ABNORMAL HIGH (ref 70–99)

## 2023-06-06 MED ORDER — AMOXICILLIN-POT CLAVULANATE 875-125 MG PO TABS
1.0000 | ORAL_TABLET | Freq: Two times a day (BID) | ORAL | 0 refills | Status: DC
  Filled 2023-06-06: qty 8, 4d supply, fill #0

## 2023-06-06 MED ORDER — ACETAMINOPHEN 500 MG PO TABS
1000.0000 mg | ORAL_TABLET | Freq: Four times a day (QID) | ORAL | 0 refills | Status: AC | PRN
  Filled 2023-06-06: qty 30, 4d supply, fill #0

## 2023-06-06 MED ORDER — DOXYCYCLINE HYCLATE 100 MG PO TABS
100.0000 mg | ORAL_TABLET | Freq: Two times a day (BID) | ORAL | 0 refills | Status: AC
  Filled 2023-06-06: qty 8, 4d supply, fill #0

## 2023-06-06 MED ORDER — OXYCODONE HCL 5 MG PO TABS
5.0000 mg | ORAL_TABLET | Freq: Four times a day (QID) | ORAL | 0 refills | Status: AC | PRN
  Filled 2023-06-06: qty 10, 3d supply, fill #0

## 2023-06-06 NOTE — Plan of Care (Signed)
  Problem: Education: Goal: Ability to describe self-care measures that may prevent or decrease complications (Diabetes Survival Skills Education) will improve Outcome: Adequate for Discharge Goal: Individualized Educational Video(s) Outcome: Adequate for Discharge   Problem: Coping: Goal: Ability to adjust to condition or change in health will improve Outcome: Adequate for Discharge   Problem: Fluid Volume: Goal: Ability to maintain a balanced intake and output will improve Outcome: Adequate for Discharge   Problem: Health Behavior/Discharge Planning: Goal: Ability to identify and utilize available resources and services will improve Outcome: Adequate for Discharge Goal: Ability to manage health-related needs will improve Outcome: Adequate for Discharge   Problem: Metabolic: Goal: Ability to maintain appropriate glucose levels will improve Outcome: Adequate for Discharge   Problem: Nutritional: Goal: Maintenance of adequate nutrition will improve Outcome: Adequate for Discharge Goal: Progress toward achieving an optimal weight will improve Outcome: Adequate for Discharge   Problem: Skin Integrity: Goal: Risk for impaired skin integrity will decrease Outcome: Adequate for Discharge   Problem: Tissue Perfusion: Goal: Adequacy of tissue perfusion will improve Outcome: Adequate for Discharge   Problem: Education: Goal: Knowledge of General Education information will improve Description: Including pain rating scale, medication(s)/side effects and non-pharmacologic comfort measures Outcome: Adequate for Discharge   Problem: Health Behavior/Discharge Planning: Goal: Ability to manage health-related needs will improve Outcome: Adequate for Discharge   Problem: Clinical Measurements: Goal: Ability to maintain clinical measurements within normal limits will improve Outcome: Adequate for Discharge Goal: Will remain free from infection Outcome: Adequate for Discharge Goal:  Diagnostic test results will improve Outcome: Adequate for Discharge Goal: Respiratory complications will improve Outcome: Adequate for Discharge Goal: Cardiovascular complication will be avoided Outcome: Adequate for Discharge   Problem: Activity: Goal: Risk for activity intolerance will decrease Outcome: Adequate for Discharge   Problem: Nutrition: Goal: Adequate nutrition will be maintained Outcome: Adequate for Discharge   Problem: Coping: Goal: Level of anxiety will decrease Outcome: Adequate for Discharge   Problem: Elimination: Goal: Will not experience complications related to bowel motility Outcome: Adequate for Discharge Goal: Will not experience complications related to urinary retention Outcome: Adequate for Discharge   Problem: Pain Managment: Goal: General experience of comfort will improve Outcome: Adequate for Discharge   Problem: Safety: Goal: Ability to remain free from injury will improve Outcome: Adequate for Discharge   Problem: Skin Integrity: Goal: Risk for impaired skin integrity will decrease Outcome: Adequate for Discharge   Problem: Education: Goal: Understanding of CV disease, CV risk reduction, and recovery process will improve Outcome: Adequate for Discharge Goal: Individualized Educational Video(s) Outcome: Adequate for Discharge   Problem: Activity: Goal: Ability to return to baseline activity level will improve Outcome: Adequate for Discharge   Problem: Cardiovascular: Goal: Ability to achieve and maintain adequate cardiovascular perfusion will improve Outcome: Adequate for Discharge Goal: Vascular access site(s) Level 0-1 will be maintained Outcome: Adequate for Discharge   Problem: Health Behavior/Discharge Planning: Goal: Ability to safely manage health-related needs after discharge will improve Outcome: Adequate for Discharge   

## 2023-06-06 NOTE — Progress Notes (Signed)
Physical Therapy Treatment Patient Details Name: Andres Richardson MRN: [id number] DOB: [birth year] Today's Date: 06/06/2023   History of Present Illness 57 y/o male presents [facility] 05/26/23 w/ ulceration on R foot. 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 PMHx: CAD, colostomy in place, HTN, anemia, PAD, T2DM, HFpEF    PT Comments  Pt admitted with above diagnosis. Pt was able to ambulate with RW with min assist with pt losing balance x 1 needing assist for stability. Pt encouraged to use wheelchair at home for safety so that he maintains NWB right LE and continue to work on gait with HHPT. Pt agreeable. Contacted CM to order wheelchair.  Pt currently with functional limitations due to the deficits listed below (see PT Problem List). Pt will benefit from acute skilled PT to increase their independence and safety with mobility to allow discharge.       If plan is discharge home, recommend the following: A little help with walking and/or transfers;A little help with bathing/dressing/bathroom;Help with stairs or ramp for entrance   Can travel by private vehicle        Equipment Recommendations  Wheelchair (measurements PT);Wheelchair cushion (measurements PT);BSC/3in1    Recommendations for Other Services       Precautions / Restrictions Precautions Precautions: Fall Required Braces or Orthoses: Other Brace Other Brace: R CAM boot with OOB mobility Restrictions Weight Bearing Restrictions: Yes RLE Weight Bearing: Non weight bearing     Mobility  Bed Mobility Overal bed mobility: Modified Independent                  Transfers Overall transfer level: Needs assistance Equipment used: Rolling walker (2 wheels) Transfers: Sit to/from Stand, Bed to chair/wheelchair/BSC Sit to Stand: Min assist           General transfer comment: cues for hand  and R LE placement and technique    Ambulation/Gait Ambulation/Gait assistance: Min assist Gait Distance  (Feet): 20 Feet Assistive device: Rolling walker (2 wheels) Gait Pattern/deviations: Trunk flexed (hop-to) Gait velocity: dec Gait velocity interpretation: <1.31 ft/sec, indicative of household ambulator   General Gait Details: Slow hop-to pattern with  cues for sequencing. Pt demonstrating one R lateral LOB requiring assist to correct. Increased difficulty with turns. Pt demonstrating quick fatigue.   Stairs             Wheelchair Mobility     Tilt Bed    Modified Rankin (Stroke Patients Only)       Balance Overall balance assessment: Needs assistance Sitting-balance support: Feet supported, No upper extremity supported Sitting balance-Leahy Scale: Good Sitting balance - Comments: sitting EOB   Standing balance support: Bilateral upper extremity supported, During functional activity, Reliant on assistive device for balance Standing balance-Leahy Scale: Poor Standing balance comment: reliant on RW for stability                            Cognition Arousal: Alert Behavior During Therapy: WFL for tasks assessed/performed Overall Cognitive Status: Within Functional Limits for tasks assessed                                 General Comments: AAOx4 and pleasant throughout.        Exercises      General Comments General comments (skin integrity, edema, etc.): VSS      Pertinent Vitals/Pain Pain Assessment Pain Assessment: Faces Faces  Pain Scale: Hurts a little bit Pain Location: R foot Pain Descriptors / Indicators: Aching, Discomfort Pain Intervention(s): Limited activity within patient's tolerance, Monitored during session, Repositioned    Home Living                          Prior Function            PT Goals (current goals can now be found in the care plan section) Acute Rehab PT Goals Patient Stated Goal: to go home Progress towards PT goals: Progressing toward goals    Frequency    Min 1X/week       PT Plan      Co-evaluation              AM-PAC PT "6 Clicks" Mobility   Outcome Measure  Help needed turning from your back to your side while in a flat bed without using bedrails?: None Help needed moving from lying on your back to sitting on the side of a flat bed without using bedrails?: None Help needed moving to and from a bed to a chair (including a wheelchair)?: A Little Help needed standing up from a chair using your arms (e.g., wheelchair or bedside chair)?: A Little Help needed to walk in hospital room?: A Little Help needed climbing 3-5 steps with a railing? : Total 6 Click Score: 18    End of Session Equipment Utilized During Treatment: Gait belt;Other (comment) (RLE CAM boot) Activity Tolerance: Patient limited by fatigue Patient left: in bed;with call bell/phone within reach (sitting EOB) Nurse Communication: Mobility status PT Visit Diagnosis: Unsteadiness on feet (R26.81);Other abnormalities of gait and mobility (R26.89)     Time: 5409-8119 PT Time Calculation (min) (ACUTE ONLY): 24 min  Charges:    $Gait Training: 8-22 mins $Self Care/Home Management: 8-22 PT General Charges $$ ACUTE PT VISIT: 1 Visit                     Jerritt Cardoza M,PT Acute Rehab Services [phone]    Bevelyn Buckles 06/06/2023, 12:28 PM

## 2023-06-06 NOTE — Discharge Summary (Signed)
subcutaneous edema in the forefoot, cellulitis not excluded. IMPRESSION: 1. Osteomyelitis of the fifth metatarsal and phalanges of the fifth toe with bony destructive findings in the head and distal metaphysis of the fifth metatarsal and proximally in the fifth toe. Overlying ulceration with gas tracking over adjacent to the head of the fifth metatarsal and fifth MTP joint. 2. There may be some slight reactive endosteal edema in the fourth metatarsal and fourth toe proximal phalanx but without compelling evidence of fourth digit osteomyelitis at this time. 3. Edema medially in the head of the first metatarsal probably from arthropathy such as gout. 4. Diffuse low-grade atrophy and edema in the regional musculature is likely neurogenic. 5. Moderate dorsal subcutaneous edema in the forefoot, cellulitis not excluded. Electronically Signed: By: Gaylyn Rong M.D. On: 05/26/2023 20:56   VAS [name] ABI WITH/WO TBI  Result Date: 05/27/2023  LOWER EXTREMITY DOPPLER STUDY Patient Name:  Andres Richardson  Date of Exam:   05/27/2023 Medical Rec #:  [id number]        Accession #:    [id number] Date of Birth: [birth year]        Patient Gender: M Patient Age:   57 years Exam Location:  [facility] Procedure:      VAS [name] ABI WITH/WO TBI Referring Phys: Heath Lark --------------------------------------------------------------------------------  Indications: DM ulceration / osteomyelitis High Risk Factors: Hypertension, hyperlipidemia, Diabetes, current smoker,                    coronary artery disease. Other Factors: CHF, CKD.  Comparison Study: Previous exam 01/21/23 Performing Technologist: Ernestene Mention RVT/RDMS  Examination Guidelines: A complete evaluation includes at minimum, Doppler waveform signals and systolic blood pressure reading at the level of bilateral brachial, anterior tibial, and posterior tibial arteries, when vessel segments are accessible. Bilateral testing is considered an integral part of a complete examination. Photoelectric Plethysmograph (PPG) waveforms and toe systolic pressure readings are included as required and additional duplex testing as needed. Limited examinations for reoccurring indications may be performed as noted.  ABI Findings: +---------+------------------+-----+----------+--------+ Right    Rt Pressure (mmHg)IndexWaveform  Comment  +---------+------------------+-----+----------+--------+ Brachial 127                    triphasic          +---------+------------------+-----+----------+--------+ PTA      72                0.57 monophasic         +---------+------------------+-----+----------+--------+ DP       57                0.45 monophasic         +---------+------------------+-----+----------+--------+ Great Toe23                0.18 Abnormal           +---------+------------------+-----+----------+--------+ +---------+------------------+-----+----------+-------+ Left     Lt Pressure (mmHg)IndexWaveform  Comment +---------+------------------+-----+----------+-------+ Brachial 123                     biphasic          +---------+------------------+-----+----------+-------+ PTA      68                0.54 monophasic        +---------+------------------+-----+----------+-------+ DP       65                0.51 monophasic        +---------+------------------+-----+----------+-------+  Name: HOLSTON GARTMAN MRN: [id number] DOB: [birth year] 57 y.o. PCP: Rudene Christians, DO  Date of Admission: 05/26/2023  1:44 PM Date of Discharge: 06/06/2023  Attending Physician: Dr.  Heide Spark  DISCHARGE DIAGNOSIS:  Primary Problem: Osteomyelitis of right lower extremity [facility])   Hospital Problems: Principal Problem:   Osteomyelitis of right lower extremity (HCC) Active Problems:   CAD (coronary artery disease)   Colostomy in place [facility])   Essential hypertension   Anemia   Elevated serum creatinine   PAD (peripheral artery disease) (HCC)   Diabetes (HCC)   Aortic atherosclerosis (HCC)    DISCHARGE MEDICATIONS:   Allergies as of 06/06/2023       Reactions   Adhesive [tape] Rash        Medication List     STOP taking these medications    insulin glargine 100 UNIT/ML Solostar Pen Commonly known as: LANTUS   insulin lispro 100 UNIT/ML KwikPen Commonly known as: HumaLOG KwikPen Replaced by: Candie Mile FlexTouch 100 UNIT/ML FlexTouch Pen   olmesartan 20 MG tablet Commonly known as: BENICAR   potassium chloride 10 MEQ tablet Commonly known as: KLOR-CON M       TAKE these medications    Acetaminophen Extra Strength 500 MG Tabs Take 2 tablets (1,000 mg total) by mouth every 6 (six) hours as needed for moderate pain (pain score 4-6). What changed:  how much to take when to take this   amLODipine 5 MG tablet Commonly known as: NORVASC Take 1 tablet (5 mg total) by mouth daily.   amoxicillin-clavulanate 875-125 MG tablet Commonly known as: AUGMENTIN Take 1 tablet by mouth 2 (two) times daily.   aspirin EC 81 MG tablet Take 1 tablet (81 mg total) by mouth daily. Swallow whole.   BD Pen Needle Nano U/F 32G X 4 MM Misc Generic drug: Insulin Pen Needle 1 Units by Does not apply route 4 (four) times daily.   Blood Glucose Monitoring Suppl Devi May substitute to any manufacturer covered by patient's insurance.   BLOOD GLUCOSE TEST STRIPS Strp 1 each by In  Vitro route in the morning, at noon, and at bedtime. May substitute to any manufacturer covered by patient's insurance.   calcium carbonate 500 MG chewable tablet Commonly known as: TUMS - dosed in mg elemental calcium Chew 1 tablet by mouth at bedtime.   clopidogrel 75 MG tablet Commonly known as: PLAVIX Take 1 tablet (75 mg total) by mouth daily with breakfast.   Dexcom G7 Sensor Misc USE AS DIRECTED ONCE A WEEK   doxycycline 100 MG tablet Commonly known as: VIBRA-TABS Take 1 tablet (100 mg total) by mouth every 12 (twelve) hours for 4 days.   DULoxetine 60 MG capsule Commonly known as: Cymbalta Take 1 capsule (60 mg total) by mouth daily.   ezetimibe 10 MG tablet Commonly known as: ZETIA Take 1 tablet (10 mg total) by mouth daily.   Fiasp FlexTouch 100 UNIT/ML FlexTouch Pen Generic drug: insulin aspart Inject 5 Units into the skin in the morning, at noon, and at bedtime. Replaces: insulin lispro 100 UNIT/ML KwikPen   furosemide 40 MG tablet Commonly known as: LASIX Take 1 tablet (40 mg total) by mouth daily.   gabapentin 100 MG capsule Commonly known as: NEURONTIN Take 2 capsules (200 mg total) by mouth 3 (three) times daily.   gentamicin ointment 0.1 % Commonly known as: GARAMYCIN Apply 1 Application topically daily.   Gvoke HypoPen 1-Pack 1 MG/0.2ML Soaj Generic drug: Glucagon Inject 1 mg into  subcutaneous edema in the forefoot, cellulitis not excluded. IMPRESSION: 1. Osteomyelitis of the fifth metatarsal and phalanges of the fifth toe with bony destructive findings in the head and distal metaphysis of the fifth metatarsal and proximally in the fifth toe. Overlying ulceration with gas tracking over adjacent to the head of the fifth metatarsal and fifth MTP joint. 2. There may be some slight reactive endosteal edema in the fourth metatarsal and fourth toe proximal phalanx but without compelling evidence of fourth digit osteomyelitis at this time. 3. Edema medially in the head of the first metatarsal probably from arthropathy such as gout. 4. Diffuse low-grade atrophy and edema in the regional musculature is likely neurogenic. 5. Moderate dorsal subcutaneous edema in the forefoot, cellulitis not excluded. Electronically Signed: By: Gaylyn Rong M.D. On: 05/26/2023 20:56   VAS [name] ABI WITH/WO TBI  Result Date: 05/27/2023  LOWER EXTREMITY DOPPLER STUDY Patient Name:  Andres Richardson  Date of Exam:   05/27/2023 Medical Rec #:  [id number]        Accession #:    [id number] Date of Birth: [birth year]        Patient Gender: M Patient Age:   57 years Exam Location:  [facility] Procedure:      VAS [name] ABI WITH/WO TBI Referring Phys: Heath Lark --------------------------------------------------------------------------------  Indications: DM ulceration / osteomyelitis High Risk Factors: Hypertension, hyperlipidemia, Diabetes, current smoker,                    coronary artery disease. Other Factors: CHF, CKD.  Comparison Study: Previous exam 01/21/23 Performing Technologist: Ernestene Mention RVT/RDMS  Examination Guidelines: A complete evaluation includes at minimum, Doppler waveform signals and systolic blood pressure reading at the level of bilateral brachial, anterior tibial, and posterior tibial arteries, when vessel segments are accessible. Bilateral testing is considered an integral part of a complete examination. Photoelectric Plethysmograph (PPG) waveforms and toe systolic pressure readings are included as required and additional duplex testing as needed. Limited examinations for reoccurring indications may be performed as noted.  ABI Findings: +---------+------------------+-----+----------+--------+ Right    Rt Pressure (mmHg)IndexWaveform  Comment  +---------+------------------+-----+----------+--------+ Brachial 127                    triphasic          +---------+------------------+-----+----------+--------+ PTA      72                0.57 monophasic         +---------+------------------+-----+----------+--------+ DP       57                0.45 monophasic         +---------+------------------+-----+----------+--------+ Great Toe23                0.18 Abnormal           +---------+------------------+-----+----------+--------+ +---------+------------------+-----+----------+-------+ Left     Lt Pressure (mmHg)IndexWaveform  Comment +---------+------------------+-----+----------+-------+ Brachial 123                     biphasic          +---------+------------------+-----+----------+-------+ PTA      68                0.54 monophasic        +---------+------------------+-----+----------+-------+ DP       65                0.51 monophasic        +---------+------------------+-----+----------+-------+  subcutaneous edema in the forefoot, cellulitis not excluded. IMPRESSION: 1. Osteomyelitis of the fifth metatarsal and phalanges of the fifth toe with bony destructive findings in the head and distal metaphysis of the fifth metatarsal and proximally in the fifth toe. Overlying ulceration with gas tracking over adjacent to the head of the fifth metatarsal and fifth MTP joint. 2. There may be some slight reactive endosteal edema in the fourth metatarsal and fourth toe proximal phalanx but without compelling evidence of fourth digit osteomyelitis at this time. 3. Edema medially in the head of the first metatarsal probably from arthropathy such as gout. 4. Diffuse low-grade atrophy and edema in the regional musculature is likely neurogenic. 5. Moderate dorsal subcutaneous edema in the forefoot, cellulitis not excluded. Electronically Signed: By: Gaylyn Rong M.D. On: 05/26/2023 20:56   VAS [name] ABI WITH/WO TBI  Result Date: 05/27/2023  LOWER EXTREMITY DOPPLER STUDY Patient Name:  Andres Richardson  Date of Exam:   05/27/2023 Medical Rec #:  [id number]        Accession #:    [id number] Date of Birth: [birth year]        Patient Gender: M Patient Age:   57 years Exam Location:  [facility] Procedure:      VAS [name] ABI WITH/WO TBI Referring Phys: Heath Lark --------------------------------------------------------------------------------  Indications: DM ulceration / osteomyelitis High Risk Factors: Hypertension, hyperlipidemia, Diabetes, current smoker,                    coronary artery disease. Other Factors: CHF, CKD.  Comparison Study: Previous exam 01/21/23 Performing Technologist: Ernestene Mention RVT/RDMS  Examination Guidelines: A complete evaluation includes at minimum, Doppler waveform signals and systolic blood pressure reading at the level of bilateral brachial, anterior tibial, and posterior tibial arteries, when vessel segments are accessible. Bilateral testing is considered an integral part of a complete examination. Photoelectric Plethysmograph (PPG) waveforms and toe systolic pressure readings are included as required and additional duplex testing as needed. Limited examinations for reoccurring indications may be performed as noted.  ABI Findings: +---------+------------------+-----+----------+--------+ Right    Rt Pressure (mmHg)IndexWaveform  Comment  +---------+------------------+-----+----------+--------+ Brachial 127                    triphasic          +---------+------------------+-----+----------+--------+ PTA      72                0.57 monophasic         +---------+------------------+-----+----------+--------+ DP       57                0.45 monophasic         +---------+------------------+-----+----------+--------+ Great Toe23                0.18 Abnormal           +---------+------------------+-----+----------+--------+ +---------+------------------+-----+----------+-------+ Left     Lt Pressure (mmHg)IndexWaveform  Comment +---------+------------------+-----+----------+-------+ Brachial 123                     biphasic          +---------+------------------+-----+----------+-------+ PTA      68                0.54 monophasic        +---------+------------------+-----+----------+-------+ DP       65                0.51 monophasic        +---------+------------------+-----+----------+-------+  Name: HOLSTON GARTMAN MRN: [id number] DOB: [birth year] 57 y.o. PCP: Rudene Christians, DO  Date of Admission: 05/26/2023  1:44 PM Date of Discharge: 06/06/2023  Attending Physician: Dr.  Heide Spark  DISCHARGE DIAGNOSIS:  Primary Problem: Osteomyelitis of right lower extremity [facility])   Hospital Problems: Principal Problem:   Osteomyelitis of right lower extremity (HCC) Active Problems:   CAD (coronary artery disease)   Colostomy in place [facility])   Essential hypertension   Anemia   Elevated serum creatinine   PAD (peripheral artery disease) (HCC)   Diabetes (HCC)   Aortic atherosclerosis (HCC)    DISCHARGE MEDICATIONS:   Allergies as of 06/06/2023       Reactions   Adhesive [tape] Rash        Medication List     STOP taking these medications    insulin glargine 100 UNIT/ML Solostar Pen Commonly known as: LANTUS   insulin lispro 100 UNIT/ML KwikPen Commonly known as: HumaLOG KwikPen Replaced by: Candie Mile FlexTouch 100 UNIT/ML FlexTouch Pen   olmesartan 20 MG tablet Commonly known as: BENICAR   potassium chloride 10 MEQ tablet Commonly known as: KLOR-CON M       TAKE these medications    Acetaminophen Extra Strength 500 MG Tabs Take 2 tablets (1,000 mg total) by mouth every 6 (six) hours as needed for moderate pain (pain score 4-6). What changed:  how much to take when to take this   amLODipine 5 MG tablet Commonly known as: NORVASC Take 1 tablet (5 mg total) by mouth daily.   amoxicillin-clavulanate 875-125 MG tablet Commonly known as: AUGMENTIN Take 1 tablet by mouth 2 (two) times daily.   aspirin EC 81 MG tablet Take 1 tablet (81 mg total) by mouth daily. Swallow whole.   BD Pen Needle Nano U/F 32G X 4 MM Misc Generic drug: Insulin Pen Needle 1 Units by Does not apply route 4 (four) times daily.   Blood Glucose Monitoring Suppl Devi May substitute to any manufacturer covered by patient's insurance.   BLOOD GLUCOSE TEST STRIPS Strp 1 each by In  Vitro route in the morning, at noon, and at bedtime. May substitute to any manufacturer covered by patient's insurance.   calcium carbonate 500 MG chewable tablet Commonly known as: TUMS - dosed in mg elemental calcium Chew 1 tablet by mouth at bedtime.   clopidogrel 75 MG tablet Commonly known as: PLAVIX Take 1 tablet (75 mg total) by mouth daily with breakfast.   Dexcom G7 Sensor Misc USE AS DIRECTED ONCE A WEEK   doxycycline 100 MG tablet Commonly known as: VIBRA-TABS Take 1 tablet (100 mg total) by mouth every 12 (twelve) hours for 4 days.   DULoxetine 60 MG capsule Commonly known as: Cymbalta Take 1 capsule (60 mg total) by mouth daily.   ezetimibe 10 MG tablet Commonly known as: ZETIA Take 1 tablet (10 mg total) by mouth daily.   Fiasp FlexTouch 100 UNIT/ML FlexTouch Pen Generic drug: insulin aspart Inject 5 Units into the skin in the morning, at noon, and at bedtime. Replaces: insulin lispro 100 UNIT/ML KwikPen   furosemide 40 MG tablet Commonly known as: LASIX Take 1 tablet (40 mg total) by mouth daily.   gabapentin 100 MG capsule Commonly known as: NEURONTIN Take 2 capsules (200 mg total) by mouth 3 (three) times daily.   gentamicin ointment 0.1 % Commonly known as: GARAMYCIN Apply 1 Application topically daily.   Gvoke HypoPen 1-Pack 1 MG/0.2ML Soaj Generic drug: Glucagon Inject 1 mg into  subcutaneous edema in the forefoot, cellulitis not excluded. IMPRESSION: 1. Osteomyelitis of the fifth metatarsal and phalanges of the fifth toe with bony destructive findings in the head and distal metaphysis of the fifth metatarsal and proximally in the fifth toe. Overlying ulceration with gas tracking over adjacent to the head of the fifth metatarsal and fifth MTP joint. 2. There may be some slight reactive endosteal edema in the fourth metatarsal and fourth toe proximal phalanx but without compelling evidence of fourth digit osteomyelitis at this time. 3. Edema medially in the head of the first metatarsal probably from arthropathy such as gout. 4. Diffuse low-grade atrophy and edema in the regional musculature is likely neurogenic. 5. Moderate dorsal subcutaneous edema in the forefoot, cellulitis not excluded. Electronically Signed: By: Gaylyn Rong M.D. On: 05/26/2023 20:56   VAS [name] ABI WITH/WO TBI  Result Date: 05/27/2023  LOWER EXTREMITY DOPPLER STUDY Patient Name:  Andres Richardson  Date of Exam:   05/27/2023 Medical Rec #:  [id number]        Accession #:    [id number] Date of Birth: [birth year]        Patient Gender: M Patient Age:   57 years Exam Location:  [facility] Procedure:      VAS [name] ABI WITH/WO TBI Referring Phys: Heath Lark --------------------------------------------------------------------------------  Indications: DM ulceration / osteomyelitis High Risk Factors: Hypertension, hyperlipidemia, Diabetes, current smoker,                    coronary artery disease. Other Factors: CHF, CKD.  Comparison Study: Previous exam 01/21/23 Performing Technologist: Ernestene Mention RVT/RDMS  Examination Guidelines: A complete evaluation includes at minimum, Doppler waveform signals and systolic blood pressure reading at the level of bilateral brachial, anterior tibial, and posterior tibial arteries, when vessel segments are accessible. Bilateral testing is considered an integral part of a complete examination. Photoelectric Plethysmograph (PPG) waveforms and toe systolic pressure readings are included as required and additional duplex testing as needed. Limited examinations for reoccurring indications may be performed as noted.  ABI Findings: +---------+------------------+-----+----------+--------+ Right    Rt Pressure (mmHg)IndexWaveform  Comment  +---------+------------------+-----+----------+--------+ Brachial 127                    triphasic          +---------+------------------+-----+----------+--------+ PTA      72                0.57 monophasic         +---------+------------------+-----+----------+--------+ DP       57                0.45 monophasic         +---------+------------------+-----+----------+--------+ Great Toe23                0.18 Abnormal           +---------+------------------+-----+----------+--------+ +---------+------------------+-----+----------+-------+ Left     Lt Pressure (mmHg)IndexWaveform  Comment +---------+------------------+-----+----------+-------+ Brachial 123                     biphasic          +---------+------------------+-----+----------+-------+ PTA      68                0.54 monophasic        +---------+------------------+-----+----------+-------+ DP       65                0.51 monophasic        +---------+------------------+-----+----------+-------+  subcutaneous edema in the forefoot, cellulitis not excluded. IMPRESSION: 1. Osteomyelitis of the fifth metatarsal and phalanges of the fifth toe with bony destructive findings in the head and distal metaphysis of the fifth metatarsal and proximally in the fifth toe. Overlying ulceration with gas tracking over adjacent to the head of the fifth metatarsal and fifth MTP joint. 2. There may be some slight reactive endosteal edema in the fourth metatarsal and fourth toe proximal phalanx but without compelling evidence of fourth digit osteomyelitis at this time. 3. Edema medially in the head of the first metatarsal probably from arthropathy such as gout. 4. Diffuse low-grade atrophy and edema in the regional musculature is likely neurogenic. 5. Moderate dorsal subcutaneous edema in the forefoot, cellulitis not excluded. Electronically Signed: By: Gaylyn Rong M.D. On: 05/26/2023 20:56   VAS [name] ABI WITH/WO TBI  Result Date: 05/27/2023  LOWER EXTREMITY DOPPLER STUDY Patient Name:  Andres Richardson  Date of Exam:   05/27/2023 Medical Rec #:  [id number]        Accession #:    [id number] Date of Birth: [birth year]        Patient Gender: M Patient Age:   57 years Exam Location:  [facility] Procedure:      VAS [name] ABI WITH/WO TBI Referring Phys: Heath Lark --------------------------------------------------------------------------------  Indications: DM ulceration / osteomyelitis High Risk Factors: Hypertension, hyperlipidemia, Diabetes, current smoker,                    coronary artery disease. Other Factors: CHF, CKD.  Comparison Study: Previous exam 01/21/23 Performing Technologist: Ernestene Mention RVT/RDMS  Examination Guidelines: A complete evaluation includes at minimum, Doppler waveform signals and systolic blood pressure reading at the level of bilateral brachial, anterior tibial, and posterior tibial arteries, when vessel segments are accessible. Bilateral testing is considered an integral part of a complete examination. Photoelectric Plethysmograph (PPG) waveforms and toe systolic pressure readings are included as required and additional duplex testing as needed. Limited examinations for reoccurring indications may be performed as noted.  ABI Findings: +---------+------------------+-----+----------+--------+ Right    Rt Pressure (mmHg)IndexWaveform  Comment  +---------+------------------+-----+----------+--------+ Brachial 127                    triphasic          +---------+------------------+-----+----------+--------+ PTA      72                0.57 monophasic         +---------+------------------+-----+----------+--------+ DP       57                0.45 monophasic         +---------+------------------+-----+----------+--------+ Great Toe23                0.18 Abnormal           +---------+------------------+-----+----------+--------+ +---------+------------------+-----+----------+-------+ Left     Lt Pressure (mmHg)IndexWaveform  Comment +---------+------------------+-----+----------+-------+ Brachial 123                     biphasic          +---------+------------------+-----+----------+-------+ PTA      68                0.54 monophasic        +---------+------------------+-----+----------+-------+ DP       65                0.51 monophasic        +---------+------------------+-----+----------+-------+  Name: HOLSTON GARTMAN MRN: [id number] DOB: [birth year] 57 y.o. PCP: Rudene Christians, DO  Date of Admission: 05/26/2023  1:44 PM Date of Discharge: 06/06/2023  Attending Physician: Dr.  Heide Spark  DISCHARGE DIAGNOSIS:  Primary Problem: Osteomyelitis of right lower extremity [facility])   Hospital Problems: Principal Problem:   Osteomyelitis of right lower extremity (HCC) Active Problems:   CAD (coronary artery disease)   Colostomy in place [facility])   Essential hypertension   Anemia   Elevated serum creatinine   PAD (peripheral artery disease) (HCC)   Diabetes (HCC)   Aortic atherosclerosis (HCC)    DISCHARGE MEDICATIONS:   Allergies as of 06/06/2023       Reactions   Adhesive [tape] Rash        Medication List     STOP taking these medications    insulin glargine 100 UNIT/ML Solostar Pen Commonly known as: LANTUS   insulin lispro 100 UNIT/ML KwikPen Commonly known as: HumaLOG KwikPen Replaced by: Candie Mile FlexTouch 100 UNIT/ML FlexTouch Pen   olmesartan 20 MG tablet Commonly known as: BENICAR   potassium chloride 10 MEQ tablet Commonly known as: KLOR-CON M       TAKE these medications    Acetaminophen Extra Strength 500 MG Tabs Take 2 tablets (1,000 mg total) by mouth every 6 (six) hours as needed for moderate pain (pain score 4-6). What changed:  how much to take when to take this   amLODipine 5 MG tablet Commonly known as: NORVASC Take 1 tablet (5 mg total) by mouth daily.   amoxicillin-clavulanate 875-125 MG tablet Commonly known as: AUGMENTIN Take 1 tablet by mouth 2 (two) times daily.   aspirin EC 81 MG tablet Take 1 tablet (81 mg total) by mouth daily. Swallow whole.   BD Pen Needle Nano U/F 32G X 4 MM Misc Generic drug: Insulin Pen Needle 1 Units by Does not apply route 4 (four) times daily.   Blood Glucose Monitoring Suppl Devi May substitute to any manufacturer covered by patient's insurance.   BLOOD GLUCOSE TEST STRIPS Strp 1 each by In  Vitro route in the morning, at noon, and at bedtime. May substitute to any manufacturer covered by patient's insurance.   calcium carbonate 500 MG chewable tablet Commonly known as: TUMS - dosed in mg elemental calcium Chew 1 tablet by mouth at bedtime.   clopidogrel 75 MG tablet Commonly known as: PLAVIX Take 1 tablet (75 mg total) by mouth daily with breakfast.   Dexcom G7 Sensor Misc USE AS DIRECTED ONCE A WEEK   doxycycline 100 MG tablet Commonly known as: VIBRA-TABS Take 1 tablet (100 mg total) by mouth every 12 (twelve) hours for 4 days.   DULoxetine 60 MG capsule Commonly known as: Cymbalta Take 1 capsule (60 mg total) by mouth daily.   ezetimibe 10 MG tablet Commonly known as: ZETIA Take 1 tablet (10 mg total) by mouth daily.   Fiasp FlexTouch 100 UNIT/ML FlexTouch Pen Generic drug: insulin aspart Inject 5 Units into the skin in the morning, at noon, and at bedtime. Replaces: insulin lispro 100 UNIT/ML KwikPen   furosemide 40 MG tablet Commonly known as: LASIX Take 1 tablet (40 mg total) by mouth daily.   gabapentin 100 MG capsule Commonly known as: NEURONTIN Take 2 capsules (200 mg total) by mouth 3 (three) times daily.   gentamicin ointment 0.1 % Commonly known as: GARAMYCIN Apply 1 Application topically daily.   Gvoke HypoPen 1-Pack 1 MG/0.2ML Soaj Generic drug: Glucagon Inject 1 mg into  Name: HOLSTON GARTMAN MRN: [id number] DOB: [birth year] 57 y.o. PCP: Rudene Christians, DO  Date of Admission: 05/26/2023  1:44 PM Date of Discharge: 06/06/2023  Attending Physician: Dr.  Heide Spark  DISCHARGE DIAGNOSIS:  Primary Problem: Osteomyelitis of right lower extremity [facility])   Hospital Problems: Principal Problem:   Osteomyelitis of right lower extremity (HCC) Active Problems:   CAD (coronary artery disease)   Colostomy in place [facility])   Essential hypertension   Anemia   Elevated serum creatinine   PAD (peripheral artery disease) (HCC)   Diabetes (HCC)   Aortic atherosclerosis (HCC)    DISCHARGE MEDICATIONS:   Allergies as of 06/06/2023       Reactions   Adhesive [tape] Rash        Medication List     STOP taking these medications    insulin glargine 100 UNIT/ML Solostar Pen Commonly known as: LANTUS   insulin lispro 100 UNIT/ML KwikPen Commonly known as: HumaLOG KwikPen Replaced by: [place] FlexTouch 100 UNIT/ML FlexTouch Pen   olmesartan 20 MG tablet Commonly known as: BENICAR   potassium chloride 10 MEQ tablet Commonly known as: KLOR-CON M       TAKE these medications    Acetaminophen Extra Strength 500 MG Tabs Take 2 tablets (1,000 mg total) by mouth every 6 (six) hours as needed for moderate pain (pain score 4-6). What changed:  how much to take when to take this   amLODipine 5 MG tablet Commonly known as: NORVASC Take 1 tablet (5 mg total) by mouth daily.   amoxicillin-clavulanate 875-125 MG tablet Commonly known as: AUGMENTIN Take 1 tablet by mouth 2 (two) times daily.   aspirin EC 81 MG tablet Take 1 tablet (81 mg total) by mouth daily. Swallow whole.   BD Pen Needle Nano U/F 32G X 4 MM Misc Generic drug: Insulin Pen Needle 1 Units by Does not apply route 4 (four) times daily.   Blood Glucose Monitoring Suppl Devi May substitute to any manufacturer covered by patient's insurance.   BLOOD GLUCOSE TEST STRIPS Strp 1 each by In  Vitro route in the morning, at noon, and at bedtime. May substitute to any manufacturer covered by patient's insurance.   calcium carbonate 500 MG chewable tablet Commonly known as: TUMS - dosed in mg elemental calcium Chew 1 tablet by mouth at bedtime.   clopidogrel 75 MG tablet Commonly known as: PLAVIX Take 1 tablet (75 mg total) by mouth daily with breakfast.   Dexcom G7 Sensor Misc USE AS DIRECTED ONCE A WEEK   doxycycline 100 MG tablet Commonly known as: VIBRA-TABS Take 1 tablet (100 mg total) by mouth every 12 (twelve) hours for 4 days.   DULoxetine 60 MG capsule Commonly known as: Cymbalta Take 1 capsule (60 mg total) by mouth daily.   ezetimibe 10 MG tablet Commonly known as: ZETIA Take 1 tablet (10 mg total) by mouth daily.   Fiasp FlexTouch 100 UNIT/ML FlexTouch Pen Generic drug: insulin aspart Inject 5 Units into the skin in the morning, at noon, and at bedtime. Replaces: insulin lispro 100 UNIT/ML KwikPen   furosemide 40 MG tablet Commonly known as: LASIX Take 1 tablet (40 mg total) by mouth daily.   gabapentin 100 MG capsule Commonly known as: NEURONTIN Take 2 capsules (200 mg total) by mouth 3 (three) times daily.   gentamicin ointment 0.1 % Commonly known as: GARAMYCIN Apply 1 Application topically daily.   Gvoke HypoPen 1-Pack 1 MG/0.2ML Soaj Generic drug: Glucagon Inject 1 mg into  Name: HOLSTON GARTMAN MRN: [id number] DOB: [birth year] 57 y.o. PCP: Rudene Christians, DO  Date of Admission: 05/26/2023  1:44 PM Date of Discharge: 06/06/2023  Attending Physician: Dr.  Heide Spark  DISCHARGE DIAGNOSIS:  Primary Problem: Osteomyelitis of right lower extremity [facility])   Hospital Problems: Principal Problem:   Osteomyelitis of right lower extremity (HCC) Active Problems:   CAD (coronary artery disease)   Colostomy in place [facility])   Essential hypertension   Anemia   Elevated serum creatinine   PAD (peripheral artery disease) (HCC)   Diabetes (HCC)   Aortic atherosclerosis (HCC)    DISCHARGE MEDICATIONS:   Allergies as of 06/06/2023       Reactions   Adhesive [tape] Rash        Medication List     STOP taking these medications    insulin glargine 100 UNIT/ML Solostar Pen Commonly known as: LANTUS   insulin lispro 100 UNIT/ML KwikPen Commonly known as: HumaLOG KwikPen Replaced by: Candie Mile FlexTouch 100 UNIT/ML FlexTouch Pen   olmesartan 20 MG tablet Commonly known as: BENICAR   potassium chloride 10 MEQ tablet Commonly known as: KLOR-CON M       TAKE these medications    Acetaminophen Extra Strength 500 MG Tabs Take 2 tablets (1,000 mg total) by mouth every 6 (six) hours as needed for moderate pain (pain score 4-6). What changed:  how much to take when to take this   amLODipine 5 MG tablet Commonly known as: NORVASC Take 1 tablet (5 mg total) by mouth daily.   amoxicillin-clavulanate 875-125 MG tablet Commonly known as: AUGMENTIN Take 1 tablet by mouth 2 (two) times daily.   aspirin EC 81 MG tablet Take 1 tablet (81 mg total) by mouth daily. Swallow whole.   BD Pen Needle Nano U/F 32G X 4 MM Misc Generic drug: Insulin Pen Needle 1 Units by Does not apply route 4 (four) times daily.   Blood Glucose Monitoring Suppl Devi May substitute to any manufacturer covered by patient's insurance.   BLOOD GLUCOSE TEST STRIPS Strp 1 each by In  Vitro route in the morning, at noon, and at bedtime. May substitute to any manufacturer covered by patient's insurance.   calcium carbonate 500 MG chewable tablet Commonly known as: TUMS - dosed in mg elemental calcium Chew 1 tablet by mouth at bedtime.   clopidogrel 75 MG tablet Commonly known as: PLAVIX Take 1 tablet (75 mg total) by mouth daily with breakfast.   Dexcom G7 Sensor Misc USE AS DIRECTED ONCE A WEEK   doxycycline 100 MG tablet Commonly known as: VIBRA-TABS Take 1 tablet (100 mg total) by mouth every 12 (twelve) hours for 4 days.   DULoxetine 60 MG capsule Commonly known as: Cymbalta Take 1 capsule (60 mg total) by mouth daily.   ezetimibe 10 MG tablet Commonly known as: ZETIA Take 1 tablet (10 mg total) by mouth daily.   Fiasp FlexTouch 100 UNIT/ML FlexTouch Pen Generic drug: insulin aspart Inject 5 Units into the skin in the morning, at noon, and at bedtime. Replaces: insulin lispro 100 UNIT/ML KwikPen   furosemide 40 MG tablet Commonly known as: LASIX Take 1 tablet (40 mg total) by mouth daily.   gabapentin 100 MG capsule Commonly known as: NEURONTIN Take 2 capsules (200 mg total) by mouth 3 (three) times daily.   gentamicin ointment 0.1 % Commonly known as: GARAMYCIN Apply 1 Application topically daily.   Gvoke HypoPen 1-Pack 1 MG/0.2ML Soaj Generic drug: Glucagon Inject 1 mg into

## 2023-06-06 NOTE — TOC Transition Note (Signed)
Transition of Care [facility]) - CM/SW Discharge Note   Patient Details  Name: Andres Richardson MRN: [id number] Date of Birth: 1966-05-19  Transition of Care [facility]) CM/SW Contact:  Gala Lewandowsky, RN Phone Number: 06/06/2023, 11:11 AM   Clinical Narrative:  Plan for patient to transition home today with wheelchair from [employer]; to be delivered to the room prior to transition home. [facility] will follow for PT/OT. No further needs identified at this time.    Final next level of care: Home w Home Health Services Barriers to Discharge: No Barriers Identified  Discharge Plan and Services Additional resources added to the After Visit Summary for                DME Arranged: Lightweight manual wheelchair with seat cushion DME Agency: [employer] Date DME Agency Contacted: 06/06/23 Time DME Agency Contacted: [phone] Representative spoke with at DME Agency: Timothy Lasso HH Arranged: PT (Message left for Royanne Foots) [facility] Agency: [employer] [facility] Date [facility] Agency Contacted: 06/04/23 Time HH Agency Contacted: [phone] Representative spoke with at [facility] Agency: Message left for [employer]  Social Determinants of Health (SDOH) Interventions SDOH Screenings   Food Insecurity: No Food Insecurity (05/27/2023)  Housing: Low Risk  (05/27/2023)  Transportation Needs: No Transportation Needs (05/27/2023)  Utilities: Not At Risk (05/27/2023)  Alcohol Screen: Low Risk  (02/16/2023)  Depression (PHQ2-9): Low Risk  (02/16/2023)  Financial Resource Strain: Low Risk  (02/16/2023)  Physical Activity: Insufficiently Active (02/16/2023)  Social Connections: Moderately Isolated (02/16/2023)  Stress: No Stress Concern Present (02/16/2023)  Tobacco Use: High Risk (06/02/2023)   Readmission Risk Interventions     No data to display

## 2023-06-06 NOTE — Care Management Important Message (Signed)
Important Message  Patient Details  Name: Andres Richardson MRN: [id number] Date of Birth: [birth year]   Important Message Given:  Yes - Medicare IM     [name] 06/06/2023, 4:29 PM

## 2023-06-06 NOTE — Care Management (Signed)
    Durable Medical Equipment  (From admission, onward)           Start     Ordered   06/06/23 1013  For home use only DME standard manual wheelchair with seat cushion  Once       Comments: Patient suffers from osteomyelitis s/p transmetatarsal amputation of the R foot which impairs their ability to perform daily activities like bathing, dressing, feeding, grooming, and toileting in the home.  A walker will not resolve issue with performing activities of daily living. A wheelchair will allow patient to safely perform daily activities. Patient can safely propel the wheelchair in the home or has a caregiver who can provide assistance. Length of need Lifetime. Accessories: elevating leg rests (ELRs), wheel locks, extensions and anti-tippers.   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

## 2023-06-06 NOTE — Progress Notes (Signed)
Occupational Therapy Treatment Patient Details Name: Andres Richardson MRN: [id number] DOB: September 14, 1965 Today's Date: 06/06/2023   History of present illness 57 y/o male presents [facility] 05/26/23 w/ ulceration on R foot. 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 PMHx: CAD, colostomy in place, HTN, anemia, PAD, T2DM, HFpEF   OT comments  Session focused on education of wheelchair mgmt, compensatory strategies for LB ADLs and establishing safe ADL/IADL routine while recovering. Pt actively engaged in education and denied concerns, eager to DC home today.      If plan is discharge home, recommend the following:  Assistance with cooking/housework;Assist for transportation;Help with stairs or ramp for entrance;A little help with walking and/or transfers;A little help with bathing/dressing/bathroom   Equipment Recommendations  Wheelchair (measurements OT);Wheelchair cushion (measurements OT)    Recommendations for Other Services      Precautions / Restrictions Precautions Precautions: Fall;Other (comment) Precaution Comments: colostomy Required Braces or Orthoses: Other Brace Other Brace: R CAM boot with OOB mobility Restrictions Weight Bearing Restrictions: Yes RLE Weight Bearing: Non weight bearing       Mobility Bed Mobility Overal bed mobility: Modified Independent                  Transfers                         Balance                                           ADL either performed or assessed with clinical judgement   ADL Overall ADL's : Needs assistance/impaired                     Lower Body Dressing: Set up;Sitting/lateral leans Lower Body Dressing Details (indicate cue type and reason): discussed strategies for LB ADLs including lateral leans, easier clothing to manage and positioning strategies               General ADL Comments: Educated on wheelchair parts (brakes, footrests, armrests) and transfer  options. ADL/IADL routine - where assist may be needed and helpful DME    Extremity/Trunk Assessment Upper Extremity Assessment Upper Extremity Assessment: Overall WFL for tasks assessed   Lower Extremity Assessment Lower Extremity Assessment: Defer to PT evaluation        Vision   Vision Assessment?: No apparent visual deficits   Perception     Praxis      Cognition Arousal: Alert Behavior During Therapy: WFL for tasks assessed/performed Overall Cognitive Status: Within Functional Limits for tasks assessed                                          Exercises      Shoulder Instructions       General Comments VSS    Pertinent Vitals/ Pain       Pain Assessment Pain Assessment: No/denies pain  Home Living                                          Prior Functioning/Environment              Frequency  Min 1X/week  Progress Toward Goals  OT Goals(current goals can now be found in the care plan section)  Progress towards OT goals: Progressing toward goals  Acute Rehab OT Goals Patient Stated Goal: home today, see my cat OT Goal Formulation: With patient Time For Goal Achievement: 06/18/23 Potential to Achieve Goals: Good ADL Goals Pt Will Transfer to Toilet: with modified independence;regular height toilet;ambulating Pt Will Perform Toileting - Clothing Manipulation and hygiene: with modified independence;sit to/from stand;sitting/lateral leans Pt/caregiver will Perform Home Exercise Program: Increased strength;Both right and left upper extremity;With theraband;With theraputty;Independently;With written HEP provided Additional ADL Goal #1: Patient will demontrate ability to Independently state 4 energy conservation strategies to increase safety and independence with functional tasks.  Plan      Co-evaluation                 AM-PAC OT "6 Clicks" Daily Activity     Outcome Measure   Help from another  person eating meals?: None Help from another person taking care of personal grooming?: A Little Help from another person toileting, which includes using toliet, bedpan, or urinal?: A Little Help from another person bathing (including washing, rinsing, drying)?: A Lot Help from another person to put on and taking off regular upper body clothing?: A Little Help from another person to put on and taking off regular lower body clothing?: A Little 6 Click Score: 18    End of Session    OT Visit Diagnosis: Unsteadiness on feet (R26.81);Other abnormalities of gait and mobility (R26.89);Other (comment)   Activity Tolerance Patient tolerated treatment well   Patient Left in bed;with call bell/phone within reach   Nurse Communication          Time: 4332-9518 OT Time Calculation (min): 15 min  Charges: OT General Charges $OT Visit: 1 Visit OT Treatments $Self Care/Home Management : 8-22 mins  Bradd Canary, OTR/L Acute Rehab Services Office: [phone]   Lorre Munroe 06/06/2023, 2:46 PM

## 2023-06-07 ENCOUNTER — Telehealth: Payer: Self-pay | Admitting: Student

## 2023-06-07 NOTE — Telephone Encounter (Signed)
Attempted to call Andres Richardson again. VM full but option to send SMS with call back number provided. Clinic number provided.   Called daughter and friend with no success.

## 2023-06-07 NOTE — Telephone Encounter (Signed)
Called pt to get him scheduled to for the hospital follow up and his vm is full but I did sned a sms message with our phone number to the pt.

## 2023-06-07 NOTE — Telephone Encounter (Signed)
Attempted to call x2. LVM asking to call clinic. He is to NOT use metroprolol for his blood pressure (received by [facility] yesterday) until he follows with [name] in clinic.

## 2023-06-08 ENCOUNTER — Telehealth: Payer: Self-pay

## 2023-06-08 NOTE — Telephone Encounter (Signed)
Podiatry also trying to reach him. If able to contact him please also have him call [phone] In addition to [facility] clinic.

## 2023-06-08 NOTE — Patient Outreach (Signed)
Care Coordination   06/08/2023 Name: Andres Richardson MRN: [id number] DOB: [birth year]   Care Coordination Outreach Attempts:  An unsuccessful telephone outreach was attempted today to offer the patient information about available care coordination services.  Follow Up Plan:  Additional outreach attempts will be made to offer the patient care coordination information and services.   Encounter Outcome:  No Answer   Care Coordination Interventions:  No, not indicated    Jodelle Gross RN, BSN, CCM RN Care Manager  Transitions of Care  [facility] [employer]  [phone]

## 2023-06-09 NOTE — Telephone Encounter (Signed)
Received a call from pt - pt instructed not to take Metoprolol for his BP until his appt  per Dr Hassan Rowan, stated he understands. And pt stated he has already talked to the foot doctor.

## 2023-06-10 ENCOUNTER — Telehealth: Payer: Self-pay | Admitting: Podiatry

## 2023-06-10 NOTE — Telephone Encounter (Signed)
Pt left message on nurse line yesterday stating he needed and appt from being in the hospital and having surgery.  I called pt and his vm is fill. No my chart is active either

## 2023-06-13 ENCOUNTER — Telehealth: Payer: Self-pay | Admitting: Podiatry

## 2023-06-13 ENCOUNTER — Other Ambulatory Visit: Payer: Self-pay | Admitting: Podiatry

## 2023-06-13 MED ORDER — HYDROCODONE-ACETAMINOPHEN 5-325 MG PO TABS: 1.0000 | ORAL_TABLET | Freq: Four times a day (QID) | ORAL | 0 refills | Status: DC | PRN

## 2023-06-13 NOTE — Telephone Encounter (Signed)
Pt called and stated no one has called him to get him scheduled and I did explain I called 2 times last week and his vm is full and he does not have my chart set up.   I scheduled him to see you tomorrow at 1030am since he should have been seen last week.  He is asking if you could refill his pain medication as he is taking his last one today and is still al little sore. The pharmacy is correct in the chart.

## 2023-06-14 ENCOUNTER — Encounter: Payer: Self-pay | Admitting: Podiatry

## 2023-06-14 ENCOUNTER — Ambulatory Visit (INDEPENDENT_AMBULATORY_CARE_PROVIDER_SITE_OTHER): Payer: Medicare Other

## 2023-06-14 ENCOUNTER — Ambulatory Visit (INDEPENDENT_AMBULATORY_CARE_PROVIDER_SITE_OTHER): Payer: Medicare Other | Admitting: Podiatry

## 2023-06-14 DIAGNOSIS — L97516 Non-pressure chronic ulcer of other part of right foot with bone involvement without evidence of necrosis: Secondary | ICD-10-CM

## 2023-06-14 NOTE — Telephone Encounter (Signed)
Lvm for pt that medication was sent in.

## 2023-06-14 NOTE — Patient Instructions (Signed)
If home health comes out they can apply xeroform to the incision then cover with 4x4, kerlix, ACE.   Continue to not put weight on the foot.

## 2023-06-14 NOTE — Progress Notes (Signed)
Subjective: No chief complaint on file.  57 year old male presents the office today for ongoing right transmetatarsal amputation on June 02, 2023.  He presents today for follow-up.  Unfortunately was not able to come in last week and she was having issues with his phone.  No fevers or chills he reports.  He comes complete his course of antibiotics.  He is currently off the foot is much as possible.  He has no other concerns.  Objective: AAO x3, NAD DP/PT pulses palpable bilaterally, CRT less than 3 seconds Status post transmetatarsal amputation.  Sutures, staples intact.  There is still some edema but this appears to be improving.  There is no ascending cellulitis.  There is no drainage or pus.  No signs of infection noted at this time. No pain with calf compression, swelling, warmth, erythema      Assessment: Status post transmetatarsal amputation  Plan: -All treatment options discussed with the patient including all alternatives, risks, complications.  -X-rays obtained reviewed.  Status post transmetatarsal amputation.  No evidence of cortical changes suggest osteomyelitis.  No soft tissue emphysema.  Staples intact. -Xeroform applied followed by dressing.  He can leave the dressing intact until follow-up with home health comes out they can perform dressing changes with similar dressing. -Continue nonweightbearing -Finish course of antibiotics -Pain medication as needed -Elevation -Monitor for any clinical signs or symptoms of infection and directed to call the office immediately should any occur or go to the ER. -Patient encouraged to call the office with any questions, concerns, change in symptoms.   [name] DPM

## 2023-06-21 ENCOUNTER — Telehealth: Payer: Self-pay

## 2023-06-21 NOTE — Telephone Encounter (Signed)
[facility] nurse called and left a message. She is asking for a verbal order for OT to evaluate patient - please advise if this is ok and I will call - thanks

## 2023-06-22 ENCOUNTER — Ambulatory Visit: Payer: Medicare Other | Admitting: Internal Medicine

## 2023-06-22 VITALS — BP 157/63 | HR 89 | Temp 98.0°F | Wt 208.1 lb

## 2023-06-22 DIAGNOSIS — I130 Hypertensive heart and chronic kidney disease with heart failure and stage 1 through stage 4 chronic kidney disease, or unspecified chronic kidney disease: Secondary | ICD-10-CM

## 2023-06-22 DIAGNOSIS — E11621 Type 2 diabetes mellitus with foot ulcer: Secondary | ICD-10-CM | POA: Diagnosis present

## 2023-06-22 DIAGNOSIS — D509 Iron deficiency anemia, unspecified: Secondary | ICD-10-CM | POA: Diagnosis not present

## 2023-06-22 DIAGNOSIS — L97509 Non-pressure chronic ulcer of other part of unspecified foot with unspecified severity: Secondary | ICD-10-CM | POA: Diagnosis not present

## 2023-06-22 DIAGNOSIS — Z89431 Acquired absence of right foot: Secondary | ICD-10-CM | POA: Diagnosis not present

## 2023-06-22 DIAGNOSIS — Z933 Colostomy status: Secondary | ICD-10-CM

## 2023-06-22 DIAGNOSIS — I5032 Chronic diastolic (congestive) heart failure: Secondary | ICD-10-CM | POA: Diagnosis not present

## 2023-06-22 DIAGNOSIS — E1151 Type 2 diabetes mellitus with diabetic peripheral angiopathy without gangrene: Secondary | ICD-10-CM

## 2023-06-22 DIAGNOSIS — E1122 Type 2 diabetes mellitus with diabetic chronic kidney disease: Secondary | ICD-10-CM | POA: Diagnosis not present

## 2023-06-22 DIAGNOSIS — K279 Peptic ulcer, site unspecified, unspecified as acute or chronic, without hemorrhage or perforation: Secondary | ICD-10-CM | POA: Diagnosis not present

## 2023-06-22 DIAGNOSIS — N182 Chronic kidney disease, stage 2 (mild): Secondary | ICD-10-CM

## 2023-06-22 DIAGNOSIS — E1165 Type 2 diabetes mellitus with hyperglycemia: Secondary | ICD-10-CM | POA: Diagnosis not present

## 2023-06-22 DIAGNOSIS — I10 Essential (primary) hypertension: Secondary | ICD-10-CM

## 2023-06-22 DIAGNOSIS — Z794 Long term (current) use of insulin: Secondary | ICD-10-CM | POA: Diagnosis not present

## 2023-06-22 DIAGNOSIS — I739 Peripheral vascular disease, unspecified: Secondary | ICD-10-CM

## 2023-06-22 LAB — POCT GLYCOSYLATED HEMOGLOBIN (HGB A1C): Hemoglobin A1C: 8.7 % — AB (ref 4.0–5.6)

## 2023-06-22 LAB — GLUCOSE, CAPILLARY: Glucose-Capillary: 106 mg/dL — ABNORMAL HIGH (ref 70–99)

## 2023-06-22 NOTE — Patient Instructions (Signed)
Thank you, [name] for allowing [name] to provide your care today.   Continue to followup with podiatry and vascular surgery Come in with all medications at next visit. You are on several medications to keep stent in leg open Diabetes Decrease meal time insulin to 5 units, ONLY TAKE IF EATING A MEAL Continue tresiba and metformin Low blood count and iron IV iron infusion Referral to GI  Your weight is up, please weight daily and if continues to increase CALL ME!   I have ordered the following labs for you:   Lab Orders         Glucose, capillary         CBC no Diff         BMP8+Anion Gap         POC Hbg A1C      Referrals ordered today:    Referral Orders         Ambulatory referral to Gastroenterology      I have ordered the following medication/changed the following medications:   Stop the following medications: Medications Discontinued During This Encounter  Medication Reason   amoxicillin-clavulanate (AUGMENTIN) 875-125 MG tablet    metoprolol succinate (TOPROL-XL) 25 MG 24 hr tablet    pantoprazole (PROTONIX) 40 MG tablet      Start the following medications: No orders of the defined types were placed in this encounter.    Follow up:  2- 4 weeks    We look forward to seeing you next time. Please call our clinic at [phone] if you have any questions or concerns. The best time to call is Monday-Friday from 9am-4pm, but there is someone available 24/7. If after hours or the weekend, call the main hospital number and ask for the Internal Medicine Resident On-Call. If you need medication refills, please notify your pharmacy one week in advance and they will send [name] a request.   Thank you for trusting me with your care. Wishing you the best!   Rudene Christians, DO [facility]

## 2023-06-22 NOTE — Progress Notes (Unsigned)
Subjective:  CC: HFU, diabetes  HPI:  [name] is a 57 y.o. male with a past medical history uncontrolled diabetes, CAD, history of fournier's gangrene s/p colostomy in 7/22 who presents for hospital follow-up.  He was admitted with acute osteomyelitis of right lower extremity. He had been following closely with podiatry for chronic foot ulcer but wound did not heal. Prior to amputation he underwent drug eluting stent placement with SFA of right leg. Transmetatarsal completed on 10/17.   Please see problem based assessment and plan for additional details.  Past Medical History:  Diagnosis Date   CAD (coronary artery disease) 2022     Prox RCA lesion is 100% stenosed.   Prox Cx to Mid Cx lesion is 60% stenosed.   LPAV lesion is 60% stenosed.   Ost LAD to Prox LAD lesion is 40% stenosed.   Diabetes mellitus without complication (HCC)    Type !! 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 Diabetic neuropathy.   Fournier gangrene 2022   Grade I diastolic dysfunction 12/02/2021   Grade I diastolic dysfunction 12/02/2021   History of creation of ostomy [facility])    Hyperlipidemia    Mixed   Hypertension    Essential   Obesity    Peripheral arterial disease (HCC)    occluded left common iliac artery   Proteinuria    Tobacco use disorder     Current Outpatient Medications on File Prior to Visit  Medication Sig Dispense Refill   acetaminophen (TYLENOL) 500 MG tablet Take 2 tablets (1,000 mg total) by mouth every 6 (six) hours as needed for moderate pain (pain score 4-6). 30 tablet 0   amLODipine (NORVASC) 5 MG tablet Take 1 tablet (5 mg total) by mouth daily. 30 tablet 11   aspirin EC 81 MG tablet Take 1 tablet (81 mg total) by mouth daily. Swallow whole. 90 tablet 3   Blood Glucose Monitoring Suppl DEVI May substitute to any manufacturer covered by patient's insurance. 1 each 0   calcium carbonate (TUMS - DOSED IN MG ELEMENTAL CALCIUM) 500 MG chewable tablet Chew 1 tablet  by mouth at bedtime.     clopidogrel (PLAVIX) 75 MG tablet Take 1 tablet (75 mg total) by mouth daily with breakfast. 30 tablet 0   Continuous Glucose Sensor (DEXCOM G7 SENSOR) MISC USE AS DIRECTED ONCE A WEEK 3 each 0   DULoxetine (CYMBALTA) 60 MG capsule Take 1 capsule (60 mg total) by mouth daily. 90 capsule 3   ezetimibe (ZETIA) 10 MG tablet Take 1 tablet (10 mg total) by mouth daily. 30 tablet 0   furosemide (LASIX) 40 MG tablet Take 1 tablet (40 mg total) by mouth daily. 90 tablet 1   gabapentin (NEURONTIN) 100 MG capsule Take 2 capsules (200 mg total) by mouth 3 (three) times daily. 90 capsule 3   gentamicin ointment (GARAMYCIN) 0.1 % Apply 1 Application topically daily. 30 g 3   Glucagon (GVOKE HYPOPEN 1-PACK) 1 MG/0.2ML SOAJ Inject 1 mg into the skin as needed (low blood sugar with impaired consciousness). 0.4 mL 2   Glucose Blood (BLOOD GLUCOSE TEST STRIPS) STRP 1 each by In Vitro route in the morning, at noon, and at bedtime. May substitute to any manufacturer covered by patient's insurance. 300 strip 3   Homeopathic Products (LEG CRAMPS) TABS Take 3 tablets by mouth daily as needed (leg cramps).     HYDROcodone-acetaminophen (NORCO/VICODIN) 5-325 MG tablet Take 1 tablet by mouth every 6 (six) hours as needed. 15  tablet 0   insulin aspart (FIASP FLEXTOUCH) 100 UNIT/ML FlexTouch Pen Inject 5 Units into the skin in the morning, at noon, and at bedtime. 6 mL 0   insulin degludec (TRESIBA FLEXTOUCH) 100 UNIT/ML FlexTouch Pen Inject 60 Units into the skin at bedtime. 18 mL 0   Insulin Pen Needle (BD PEN NEEDLE NANO U/F) 32G X 4 MM MISC 1 Units by Does not apply route 4 (four) times daily. 100 each 0   Iron, Ferrous Sulfate, 325 (65 Fe) MG TABS Take 1 tablet by mouth daily.     isosorbide mononitrate (IMDUR) 30 MG 24 hr tablet Take 1 tablet (30 mg total) by mouth daily. 30 tablet 0   Lancet Device MISC 1 each by Does not apply route in the morning, at noon, and at bedtime. May substitute to any  manufacturer covered by patient's insurance. 1 each 0   metFORMIN (GLUCOPHAGE) 1000 MG tablet Take 1 tablet (1,000 mg total) by mouth 2 (two) times daily with a meal. 180 tablet 1   montelukast (SINGULAIR) 10 MG tablet Take 1 tablet (10 mg total) by mouth daily. 30 tablet 2   Multiple Vitamin (MULTIVITAMIN WITH MINERALS) TABS tablet Take 1 tablet by mouth daily. 90 tablet 1   nitroGLYCERIN (NITROSTAT) 0.4 MG SL tablet Place 1 tablet (0.4 mg total) under the tongue every 5 (five) minutes as needed for chest pain. 20 tablet 3   rosuvastatin (CRESTOR) 40 MG tablet Take 1 tablet (40 mg total) by mouth daily. 90 tablet 3   No current facility-administered medications on file prior to visit.    Family History  Problem Relation Age of Onset   CAD Mother    Colon cancer Father     Social History   Socioeconomic History   Marital status: Single    Spouse name: Not on file   Number of children: Not on file   Years of education: Not on file   Highest education level: Not on file  Occupational History   Not on file  Tobacco Use   Smoking status: Every Day    Current packs/day: 0.25    Types: Cigarettes   Smokeless tobacco: Never   Tobacco comments:    5 cigs per day/Uses Nicorette patch  Vaping Use   Vaping status: Never Used  Substance and Sexual Activity   Alcohol use: Never   Drug use: Never   Sexual activity: Not on file  Other Topics Concern   Not on file  Social History Narrative   Not on file   Social Determinants of Health   Financial Resource Strain: Low Risk  (02/16/2023)   Overall Financial Resource Strain (CARDIA)    Difficulty of Paying Living Expenses: Not hard at all  Food Insecurity: No Food Insecurity (05/27/2023)   Hunger Vital Sign    Worried About Running Out of Food in the Last Year: Never true    Ran Out of Food in the Last Year: Never true  Transportation Needs: No Transportation Needs (05/27/2023)   PRAPARE - Administrator, Civil Service  (Medical): No    Lack of Transportation (Non-Medical): No  Physical Activity: Insufficiently Active (02/16/2023)   Exercise Vital Sign    Days of Exercise per Week: 3 days    Minutes of Exercise per Session: 20 min  Stress: No Stress Concern Present (02/16/2023)   [employer] of Occupational Health - Occupational Stress Questionnaire    Feeling of Stress : Not at all  Social  Connections: Moderately Isolated (02/16/2023)   Social Connection and Isolation Panel [NHANES]    Frequency of Communication with Friends and Family: More than three times a week    Frequency of Social Gatherings with Friends and Family: Three times a week    Attends Religious Services: 1 to 4 times per year    Active Member of Clubs or Organizations: No    Attends Banker Meetings: Never    Marital Status: Divorced  Catering manager Violence: Not At Risk (05/27/2023)   Humiliation, Afraid, Rape, and Kick questionnaire    Fear of Current or Ex-Partner: No    Emotionally Abused: No    Physically Abused: No    Sexually Abused: No    Review of Systems: ROS negative except for what is noted on the assessment and plan.  Objective:   Vitals:   06/22/23 0956 06/22/23 1039  BP: (!) 159/74 (!) 157/63  Pulse: 87 89  Temp: 98 F (36.7 C)   TempSrc: Oral   SpO2: 100%   Weight: 208 lb 1.6 oz (94.4 kg)     Physical Exam: Constitutional: well-appearing  Cardiovascular: regular rate and rhythm, no m/r/g Pulmonary/Chest: normal work of breathing on room air, lungs clear to auscultation bilaterally Abdominal: colostomy in place, stoma well perfused MSK: right off loading boot in place, no lower extremity edema to right foot  Assessment & Plan:  History of transmetatarsal amputation of right foot [facility]) He has follow-up with podiatry 11/07 for amputation. He completed antibiotic therapy.  P: Continue to follow-up with podiatry  Uncontrolled type 2 diabetes mellitus with hyperglycemia, with long-term  current use of insulin (HCC) Lab Results  Component Value Date   HGBA1C 8.7 (A) 06/22/2023  Improved from 10 to 8.7. Home medications include metformin 1000 mg bid, aspart 5 units TID, degludec 60 units qhs He uses dexcom to check blood sugars. He actually has increased meal time insulin to 10 units TID and has had symptomatic hypoglycemia after meals on several days. Hypoglycemia resolves with orange juice.  P: Continue tresiba 60 units qhs Decrease humalog to 5 units TID Continue metformin 1000 mg BID Continue zetia and rosuvastatin 40 mg  Essential hypertension 159/74 to 157/63. Medications include amlodipine 5 mg, imdur 30 mg, and furosemide 40 mg. He is unsure if he has been taking these medications. He has bottles at home and he reads each prior to taking. P: Follow-up in 4 weeks with medication bottles at visit  PAD (peripheral artery disease) (HCC) Lower extremity angiography 10/16 with stenosis of right SFA. Drug eluting stent placed. He is taking plavix 75mg , aspirin 81 mg and rosuvastatin 40 mg. P: Follow-up with vascular surgery 11/21. He is to have repeat duplex in 4 weeks  Iron deficiency anemia Patient with history of esophageal ulcers on EGD 4/23. In the last several months, hgb has continued to trend down. Hgb in hospital at 7.2. He is adherent with iron supplement. Lab Results  Component Value Date   IRON 10 (L) 06/04/2023   TIBC 259 06/04/2023   FERRITIN 216 06/04/2023   Iron studies in hospital were consistent with deficiency. He is over due for colonoscopy. 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 Reverting colostomy delayed due to uncontrolled diabetes. Diabetic control improved today. If remains <9 at follow-up would recommend referral back general surgery, Dr. Sheliah Hatch.  (HFpEF)  heart failure with preserved ejection fraction  (HCC) Home medications include furosemide 40 mg every day. Weight is up about 3 lbs from hospital discharge. He does not weight himself at home. Denies worsening shortness of breath symptoms. -continue furosemide 40 mg qd  Patient discussed with Dr. Percell Boston Andres Richardson, D.O. [facility]  [id number] Pager: [phone]  Phone: [phone] Date 06/23/2023  Time 1:49 PM

## 2023-06-23 ENCOUNTER — Ambulatory Visit (INDEPENDENT_AMBULATORY_CARE_PROVIDER_SITE_OTHER): Payer: Medicare Other | Admitting: Podiatry

## 2023-06-23 ENCOUNTER — Encounter: Payer: Self-pay | Admitting: Podiatry

## 2023-06-23 DIAGNOSIS — L97516 Non-pressure chronic ulcer of other part of right foot with bone involvement without evidence of necrosis: Secondary | ICD-10-CM

## 2023-06-23 DIAGNOSIS — I5030 Unspecified diastolic (congestive) heart failure: Secondary | ICD-10-CM | POA: Insufficient documentation

## 2023-06-23 DIAGNOSIS — Z89431 Acquired absence of right foot: Secondary | ICD-10-CM | POA: Insufficient documentation

## 2023-06-23 LAB — BMP8+ANION GAP
Anion Gap: 14 mmol/L (ref 10.0–18.0)
BUN/Creatinine Ratio: 13 (ref 9–20)
BUN: 15 mg/dL (ref 6–24)
CO2: 24 mmol/L (ref 20–29)
Calcium: 7.8 mg/dL — ABNORMAL LOW (ref 8.7–10.2)
Chloride: 108 mmol/L — ABNORMAL HIGH (ref 96–106)
Creatinine, Ser: 1.17 mg/dL (ref 0.76–1.27)
Glucose: 119 mg/dL — ABNORMAL HIGH (ref 70–99)
Potassium: 4.4 mmol/L (ref 3.5–5.2)
Sodium: 146 mmol/L — ABNORMAL HIGH (ref 134–144)
eGFR: 73 mL/min/{1.73_m2} (ref >59)

## 2023-06-23 LAB — CBC
Hematocrit: 30.3 % — ABNORMAL LOW (ref 37.5–51.0)
Hemoglobin: 9.3 g/dL — ABNORMAL LOW (ref 13.0–17.7)
MCH: 25.9 pg — ABNORMAL LOW (ref 26.6–33.0)
MCHC: 30.7 g/dL — ABNORMAL LOW (ref 31.5–35.7)
MCV: 84 fL (ref 79–97)
Platelets: 256 10*3/uL (ref 150–450)
RBC: 3.59 x10E6/uL — ABNORMAL LOW (ref 4.14–5.80)
RDW: 15.2 % (ref 11.6–15.4)
WBC: 6.7 10*3/uL (ref 3.4–10.8)

## 2023-06-23 NOTE — Assessment & Plan Note (Addendum)
Patient with history of esophageal ulcers on EGD 4/23. In the last several months, hgb has continued to trend down. Hgb in hospital at 7.2. He is adherent with iron supplement. Lab Results  Component Value Date   IRON 10 (L) 06/04/2023   TIBC 259 06/04/2023   FERRITIN 216 06/04/2023   Iron studies in hospital were consistent with deficiency. He is over due for colonoscopy. P: IV iron, ganzoni with 1000mg  deficit Ferrlicit 750 mg over 3 weeks Gi referral Cbc with hgb improved to 9.3

## 2023-06-23 NOTE — Assessment & Plan Note (Signed)
He has had colostomy since 2022. Reverting colostomy delayed due to uncontrolled diabetes. Diabetic control improved today. If remains <9 at follow-up would recommend referral back general surgery, Dr. Sheliah Hatch.

## 2023-06-23 NOTE — Assessment & Plan Note (Signed)
159/74 to 157/63. Medications include amlodipine 5 mg, imdur 30 mg, and furosemide 40 mg. He is unsure if he has been taking these medications. He has bottles at home and he reads each prior to taking. P: Follow-up in 4 weeks with medication bottles at visit

## 2023-06-23 NOTE — Assessment & Plan Note (Signed)
Lower extremity angiography 10/16 with stenosis of right SFA. Drug eluting stent placed. He is taking plavix 75mg , aspirin 81 mg and rosuvastatin 40 mg. P: Follow-up with vascular surgery 11/21. He is to have repeat duplex in 4 weeks

## 2023-06-23 NOTE — Assessment & Plan Note (Addendum)
Lab Results  Component Value Date   HGBA1C 8.7 (A) 06/22/2023  Improved from 10 to 8.7. Home medications include metformin 1000 mg bid, aspart 5 units TID, degludec 60 units qhs He uses dexcom to check blood sugars. He actually has increased meal time insulin to 10 units TID and has had symptomatic hypoglycemia after meals on several days. Hypoglycemia resolves with orange juice.  P: Continue tresiba 60 units qhs Decrease humalog to 5 units TID Continue metformin 1000 mg BID Continue zetia and rosuvastatin 40 mg

## 2023-06-23 NOTE — Assessment & Plan Note (Signed)
Home medications include furosemide 40 mg every day. Weight is up about 3 lbs from hospital discharge. He does not weight himself at home. Denies worsening shortness of breath symptoms. -continue furosemide 40 mg qd

## 2023-06-23 NOTE — Assessment & Plan Note (Signed)
He has follow-up with podiatry 11/07 for amputation. He completed antibiotic therapy.  P: Continue to follow-up with podiatry

## 2023-06-24 ENCOUNTER — Other Ambulatory Visit: Payer: Self-pay

## 2023-06-24 ENCOUNTER — Encounter: Payer: Self-pay | Admitting: Internal Medicine

## 2023-06-24 DIAGNOSIS — I70239 Atherosclerosis of native arteries of right leg with ulceration of unspecified site: Secondary | ICD-10-CM

## 2023-06-24 NOTE — Progress Notes (Signed)
Internal Medicine Clinic Attending  Case discussed with the resident at the time of the visit.  We reviewed the resident's history and exam and pertinent patient test results.  I agree with the assessment, diagnosis, and plan of care documented in the resident's note.  Debe Coder, MD

## 2023-06-24 NOTE — Addendum Note (Signed)
Addended by: Debe Coder B on: 06/24/2023 09:00 AM   Modules accepted: Level of Service

## 2023-06-29 NOTE — Progress Notes (Signed)
Subjective: Chief Complaint  Patient presents with   Foot Pain    Follow up on right foot amputation healing well.    57 year old male presents the office today for ongoing right transmetatarsal amputation on June 02, 2023.  He presents today for possible suture removal.  States he has been doing well.  He does not report any fevers or chills.  He has been try stay nonweightbearing is much as possible.  No new concerns.    Objective: AAO x3, NAD DP/PT pulses palpable bilaterally, CRT less than 3 seconds Status post transmetatarsal amputation.  Sutures, staples intact.  There is still edema present however this appears to be improving.  There is no erythema or warmth noted.  There is no fluctuation or crepitation.  No malodor.  No pain with calf compression, swelling, warmth, erythema         Assessment: Status post transmetatarsal amputation  Plan: -All treatment options discussed with the patient including all alternatives, risks, complications.  -Sutures removed today.  With the staples intact.  Xeroform was applied followed by dressing.  He has a home health care nurse and they can change the dressing if needed otherwise he can leave it intact.  Remain nonweightbearing.  Elevation. -Pain medication as needed -Elevation -Monitor for any clinical signs or symptoms of infection and directed to call the office immediately should any occur or go to the ER. -Patient encouraged to call the office with any questions, concerns, change in symptoms.   [name] DPM

## 2023-06-30 ENCOUNTER — Other Ambulatory Visit: Payer: Self-pay | Admitting: Student

## 2023-06-30 ENCOUNTER — Ambulatory Visit (INDEPENDENT_AMBULATORY_CARE_PROVIDER_SITE_OTHER): Payer: Medicare Other | Admitting: Podiatry

## 2023-06-30 DIAGNOSIS — E11628 Type 2 diabetes mellitus with other skin complications: Secondary | ICD-10-CM

## 2023-06-30 DIAGNOSIS — L089 Local infection of the skin and subcutaneous tissue, unspecified: Secondary | ICD-10-CM

## 2023-06-30 DIAGNOSIS — Z89431 Acquired absence of right foot: Secondary | ICD-10-CM

## 2023-06-30 MED ORDER — AMOXICILLIN-POT CLAVULANATE 875-125 MG PO TABS: 1.0000 | ORAL_TABLET | Freq: Two times a day (BID) | ORAL | 0 refills | Status: DC

## 2023-07-04 ENCOUNTER — Ambulatory Visit (INDEPENDENT_AMBULATORY_CARE_PROVIDER_SITE_OTHER): Payer: Medicare Other | Admitting: Podiatry

## 2023-07-04 DIAGNOSIS — Z89431 Acquired absence of right foot: Secondary | ICD-10-CM

## 2023-07-04 NOTE — Progress Notes (Signed)
Subjective:  57 year old male presents the office today for ongoing right transmetatarsal amputation on June 02, 2023.  He presents today for possible suture removal.  States he has been doing well but his home health nurse has noticed a little bit of redness to the incision he reports. He has not seen any drainage, increased temperature, or pain. He does not report any fevers or chills.  He has been try stay nonweightbearing is much as possible.   Objective: AAO x3, NAD DP/PT pulses palpable bilaterally, CRT less than 3 seconds Status post transmetatarsal amputation.  Sutures intact. There is some very minimal erythema, more like pink skin. There is no warmth associated with this. No drainage or purulence noted. No increased edema. No pain. No other open lesions are noted.  No pain with calf compression, swelling, warmth, erythema   Assessment: Status post transmetatarsal amputation  Plan: -All treatment options discussed with the patient including all alternatives, risks, complications.  -Given the minimal erythema will restart antibiotics, prescribed Augmentin.  -I left the staples intact.  Xeroform was applied followed by dressing.  He has a home health care nurse and they can change the dressing if needed otherwise he can leave it intact.  Remain nonweightbearing.  Elevation. -Pain medication as needed -Elevation -Monitor for any clinical signs or symptoms of infection and directed to call the office immediately should any occur or go to the ER. -Patient encouraged to call the office with any questions, concerns, change in symptoms.   Staple removal next appointment   [name] DPM
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
## 2023-07-07 ENCOUNTER — Encounter: Payer: Self-pay | Admitting: Vascular Surgery

## 2023-07-07 ENCOUNTER — Ambulatory Visit (HOSPITAL_COMMUNITY)
Admission: RE | Admit: 2023-07-07 | Discharge: 2023-07-07 | Disposition: A | Discharge status: Home / Self Care | Payer: Medicare Other | Source: Ambulatory Visit | Attending: Vascular Surgery | Admitting: Vascular Surgery

## 2023-07-07 ENCOUNTER — Encounter: Payer: Self-pay | Admitting: Student

## 2023-07-07 ENCOUNTER — Ambulatory Visit: Payer: Medicare Other | Admitting: Student

## 2023-07-07 ENCOUNTER — Ambulatory Visit: Payer: Medicare Other | Admitting: Vascular Surgery

## 2023-07-07 ENCOUNTER — Telehealth: Payer: Self-pay | Admitting: Podiatry

## 2023-07-07 ENCOUNTER — Other Ambulatory Visit: Payer: Self-pay

## 2023-07-07 ENCOUNTER — Ambulatory Visit (INDEPENDENT_AMBULATORY_CARE_PROVIDER_SITE_OTHER)
Admission: RE | Admit: 2023-07-07 | Discharge: 2023-07-07 | Disposition: A | Discharge status: Home / Self Care | Payer: Medicare Other | Source: Ambulatory Visit | Attending: Vascular Surgery | Admitting: Vascular Surgery

## 2023-07-07 VITALS — BP 139/71 | HR 104 | Temp 98.0°F | Resp 20 | Ht 71.0 in | Wt 206.0 lb

## 2023-07-07 VITALS — BP 113/58 | HR 108 | Temp 97.6°F | Ht 71.0 in | Wt 208.7 lb

## 2023-07-07 DIAGNOSIS — Z794 Long term (current) use of insulin: Secondary | ICD-10-CM | POA: Diagnosis not present

## 2023-07-07 DIAGNOSIS — I70239 Atherosclerosis of native arteries of right leg with ulceration of unspecified site: Secondary | ICD-10-CM

## 2023-07-07 DIAGNOSIS — G8929 Other chronic pain: Secondary | ICD-10-CM | POA: Diagnosis not present

## 2023-07-07 DIAGNOSIS — R0789 Other chest pain: Secondary | ICD-10-CM | POA: Diagnosis not present

## 2023-07-07 DIAGNOSIS — I739 Peripheral vascular disease, unspecified: Secondary | ICD-10-CM

## 2023-07-07 DIAGNOSIS — Z7984 Long term (current) use of oral hypoglycemic drugs: Secondary | ICD-10-CM

## 2023-07-07 DIAGNOSIS — Z716 Tobacco abuse counseling: Secondary | ICD-10-CM | POA: Diagnosis not present

## 2023-07-07 DIAGNOSIS — E1151 Type 2 diabetes mellitus with diabetic peripheral angiopathy without gangrene: Secondary | ICD-10-CM

## 2023-07-07 DIAGNOSIS — I10 Essential (primary) hypertension: Secondary | ICD-10-CM | POA: Diagnosis present

## 2023-07-07 DIAGNOSIS — E1165 Type 2 diabetes mellitus with hyperglycemia: Secondary | ICD-10-CM | POA: Diagnosis not present

## 2023-07-07 DIAGNOSIS — F17200 Nicotine dependence, unspecified, uncomplicated: Secondary | ICD-10-CM

## 2023-07-07 DIAGNOSIS — F17210 Nicotine dependence, cigarettes, uncomplicated: Secondary | ICD-10-CM

## 2023-07-07 DIAGNOSIS — Z959 Presence of cardiac and vascular implant and graft, unspecified: Secondary | ICD-10-CM

## 2023-07-07 LAB — VAS US ABI WITH/WO TBI
Left ABI: 0.43
Right ABI: 0.72

## 2023-07-07 MED ORDER — CLOPIDOGREL BISULFATE 75 MG PO TABS: 75.0000 mg | ORAL_TABLET | Freq: Every day | ORAL | 6 refills | Status: DC

## 2023-07-07 MED ORDER — INSULIN LISPRO 100 UNIT/ML IJ SOLN: INTRAMUSCULAR | 3 refills | Status: DC

## 2023-07-07 MED ORDER — OLMESARTAN MEDOXOMIL 20 MG PO TABS: 20.0000 mg | ORAL_TABLET | Freq: Every day | ORAL | 3 refills | Status: DC

## 2023-07-07 MED ORDER — GABAPENTIN 100 MG PO CAPS: 300.0000 mg | ORAL_CAPSULE | Freq: Three times a day (TID) | ORAL | 5 refills | Status: AC

## 2023-07-07 NOTE — Progress Notes (Signed)
Subjective: Chief Complaint  Patient presents with   Routine Post Op    Room 14-  follow up from transmet amputation.  Here for possible staple removal.  Doing well overall.  Wearing post op boot as instructed.  Dressing is clean, dry and intact.  Denies NVFCNS.    57 year old male presents the office today for ongoing right transmetatarsal amputation on June 02, 2023.  He presents today for possible staple removal.  States he has been doing well.  He does not report any fevers or chills.  No pain.  Has been trying to stay nonweightbearing much as possible.    Objective: AAO x3, NAD DP/PT pulses palpable bilaterally, CRT less than 3 seconds Status post transmetatarsal amputation.  Staples intact.  There is no surrounding erythema, ascending cellulitis.  No fluctuation or crepitation.  There is no malodor.  There is no drainage or pus.  Edema is improving.   No pain with calf compression, swelling, warmth, erythema   Assessment: Status post transmetatarsal amputation  Plan: -All treatment options discussed with the patient including all alternatives, risks, complications.  -Given staples were removed today but majority remained intact.  Xeroform applied followed by dressing.  Home health can continue daily dressing changes.  Will plan on removing remainder of the staples next week. -Finish course of antibiotics. -NWB -Pain medication as needed -Elevation -Monitor for any clinical signs or symptoms of infection and directed to call the office immediately should any occur or go to the ER. -Patient encouraged to call the office with any questions, concerns, change in symptoms.   Staple removal next appointment   [name] DPM

## 2023-07-07 NOTE — Patient Instructions (Addendum)
Thank you, [name] for allowing [name] to provide your care today.   I have ordered the following labs for you:  Lab Orders  No laboratory test(s) ordered today     Referrals ordered today:   Referral Orders         Ambulatory referral to Ophthalmology       I have ordered the following medication/changed the following medications:   Stop the following medications: Medications Discontinued During This Encounter  Medication Reason   gabapentin (NEURONTIN) 100 MG capsule Reorder     Start the following medications: Meds ordered this encounter  Medications   gabapentin (NEURONTIN) 100 MG capsule    Sig: Take 3 capsules (300 mg total) by mouth 3 (three) times daily.    Dispense:  540 capsule    Refill:  5   insulin lispro (HUMALOG) 100 UNIT/ML injection    Sig: Inject 5 units before meals three times a day    Dispense:  10 mL    Refill:  3   olmesartan (BENICAR) 20 MG tablet    Sig: Take 1 tablet (20 mg total) by mouth daily.    Dispense:  90 tablet    Refill:  3     Follow up:  2 week     Remember:   Write down your blood pressures at home after sitting for 5 minutes and with both feet on the ground.   Keep up the good job with your sugars.   Andres Richardson (the one that you inject at night) go up to 65 units.   Use 5 units of the humalog only with your major meals three times a day.   Continue with the metformin at 1000 mg twice a day.   Increased the gabapentin to 300 mg three times a day.    Take Miralax daily for hard stools.    Should you have any questions or concerns please call the internal medicine clinic at [phone].     Andres Neptune, MD [facility]

## 2023-07-07 NOTE — Telephone Encounter (Signed)
Received call from Lavonna Monarch at [facility] and she asked what was pts weight bearing status.   I checked your note from 11/18 and it said  he was to remain non weight bearing. She said to let you know he is bearing weight on that foot. Moving around and things. She was to call him and remind him he is to be non weight bearing.

## 2023-07-07 NOTE — Progress Notes (Signed)
CC:   Chief Complaint  Patient presents with   2 Week Follow-up   Diabetes    HPI:  Andres Richardson is a 57 y.o. male living with a history stated below and presents today for follow up on his hypertension and diabetes. Please see problem based assessment and plan for additional details.  Past Medical History:  Diagnosis Date   CAD (coronary artery disease) 2022     Prox RCA lesion is 100% stenosed.   Prox Cx to Mid Cx lesion is 60% stenosed.   LPAV lesion is 60% stenosed.   Ost LAD to Prox LAD lesion is 40% stenosed.   Diabetes mellitus without complication (HCC)    Type !! 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 Diabetic neuropathy.   Fournier gangrene 2022   Grade I diastolic dysfunction 12/02/2021   Grade I diastolic dysfunction 12/02/2021   History of creation of ostomy [facility])    Hyperlipidemia    Mixed   Hypertension    Essential   Obesity    Peripheral arterial disease (HCC)    occluded left common iliac artery   Proteinuria    Tobacco use disorder     Current Outpatient Medications on File Prior to Visit  Medication Sig Dispense Refill   acetaminophen (TYLENOL) 500 MG tablet Take 2 tablets (1,000 mg total) by mouth every 6 (six) hours as needed for moderate pain (pain score 4-6). 30 tablet 0   amLODipine (NORVASC) 5 MG tablet Take 1 tablet (5 mg total) by mouth daily. 30 tablet 11   amoxicillin-clavulanate (AUGMENTIN) 875-125 MG tablet Take 1 tablet by mouth 2 (two) times daily. 14 tablet 0   aspirin EC 81 MG tablet Take 1 tablet (81 mg total) by mouth daily. Swallow whole. 90 tablet 3   Blood Glucose Monitoring Suppl DEVI May substitute to any manufacturer covered by patient's insurance. 1 each 0   calcium carbonate (TUMS - DOSED IN MG ELEMENTAL CALCIUM) 500 MG chewable tablet Chew 1 tablet by mouth at bedtime.     clopidogrel (PLAVIX) 75 MG tablet Take 1 tablet (75 mg total) by mouth daily. 30 tablet 6   Continuous Glucose Sensor (DEXCOM G7 SENSOR) MISC  USE AS DIRECTED ONCE A WEEK 3 each 0   DULoxetine (CYMBALTA) 60 MG capsule Take 1 capsule (60 mg total) by mouth daily. 90 capsule 3   ezetimibe (ZETIA) 10 MG tablet Take 1 tablet (10 mg total) by mouth daily. 30 tablet 0   furosemide (LASIX) 40 MG tablet Take 1 tablet (40 mg total) by mouth daily. 90 tablet 1   gentamicin ointment (GARAMYCIN) 0.1 % Apply 1 Application topically daily. 30 g 3   Glucagon (GVOKE HYPOPEN 1-PACK) 1 MG/0.2ML SOAJ Inject 1 mg into the skin as needed (low blood sugar with impaired consciousness). 0.4 mL 2   Glucose Blood (BLOOD GLUCOSE TEST STRIPS) STRP 1 each by In Vitro route in the morning, at noon, and at bedtime. May substitute to any manufacturer covered by patient's insurance. 300 strip 3   Homeopathic Products (LEG CRAMPS) TABS Take 3 tablets by mouth daily as needed (leg cramps).     HYDROcodone-acetaminophen (NORCO/VICODIN) 5-325 MG tablet Take 1 tablet by mouth every 6 (six) hours as needed. 15 tablet 0   Iron, Ferrous Sulfate, 325 (65 Fe) MG TABS Take 1 tablet by mouth daily.     isosorbide mononitrate (IMDUR) 30 MG 24 hr tablet Take 1 tablet by mouth once daily 30 tablet 0   Lancet  Device MISC 1 each by Does not apply route in the morning, at noon, and at bedtime. May substitute to any manufacturer covered by patient's insurance. 1 each 0   metFORMIN (GLUCOPHAGE) 1000 MG tablet Take 1 tablet (1,000 mg total) by mouth 2 (two) times daily with a meal. 180 tablet 1   montelukast (SINGULAIR) 10 MG tablet Take 1 tablet (10 mg total) by mouth daily. 30 tablet 2   Multiple Vitamin (MULTIVITAMIN WITH MINERALS) TABS tablet Take 1 tablet by mouth daily. 90 tablet 1   nitroGLYCERIN (NITROSTAT) 0.4 MG SL tablet Place 1 tablet (0.4 mg total) under the tongue every 5 (five) minutes as needed for chest pain. 20 tablet 3   rosuvastatin (CRESTOR) 40 MG tablet Take 1 tablet (40 mg total) by mouth daily. 90 tablet 3   No current facility-administered medications on file prior  to visit.    Family History  Problem Relation Age of Onset   CAD Mother    Colon cancer Father     Social History   Socioeconomic History   Marital status: Single    Spouse name: Not on file   Number of children: Not on file   Years of education: Not on file   Highest education level: Not on file  Occupational History   Not on file  Tobacco Use   Smoking status: Every Day    Current packs/day: 0.25    Types: Cigarettes   Smokeless tobacco: Never   Tobacco comments:    5 cigs per day/Uses Nicorette patch  Vaping Use   Vaping status: Never Used  Substance and Sexual Activity   Alcohol use: Never   Drug use: Never   Sexual activity: Not on file  Other Topics Concern   Not on file  Social History Narrative   Not on file   Social Determinants of Health   Financial Resource Strain: Low Risk  (02/16/2023)   Overall Financial Resource Strain (CARDIA)    Difficulty of Paying Living Expenses: Not hard at all  Food Insecurity: No Food Insecurity (05/27/2023)   Hunger Vital Sign    Worried About Running Out of Food in the Last Year: Never true    Ran Out of Food in the Last Year: Never true  Transportation Needs: No Transportation Needs (05/27/2023)   PRAPARE - Administrator, Civil Service (Medical): No    Lack of Transportation (Non-Medical): No  Physical Activity: Insufficiently Active (02/16/2023)   Exercise Vital Sign    Days of Exercise per Week: 3 days    Minutes of Exercise per Session: 20 min  Stress: No Stress Concern Present (02/16/2023)   [employer] of Occupational Health - Occupational Stress Questionnaire    Feeling of Stress : Not at all  Social Connections: Moderately Isolated (02/16/2023)   Social Connection and Isolation Panel [NHANES]    Frequency of Communication with Friends and Family: More than three times a week    Frequency of Social Gatherings with Friends and Family: Three times a week    Attends Religious Services: 1 to 4 times  per year    Active Member of Clubs or Organizations: No    Attends Banker Meetings: Never    Marital Status: Divorced  Catering manager Violence: Not At Risk (05/27/2023)   Humiliation, Afraid, Rape, and Kick questionnaire    Fear of Current or Ex-Partner: No    Emotionally Abused: No    Physically Abused: No    Sexually Abused:  No    Review of Systems: ROS negative except for what is noted on the assessment and plan.  Vitals:   07/07/23 1406  BP: (!) 113/58  Pulse: (!) 108  Temp: 97.6 F (36.4 C)  TempSrc: Oral  SpO2: 100%  Weight: 208 lb 11.2 oz (94.7 kg)  Height: 5\' 11"  (1.803 m)    Physical Exam: Constitutional: well-appearing in NAD HENT: normocephalic atraumatic, mucous membranes moist Eyes: conjunctiva non-erythematous Cardiovascular: regular rate and rhythm, no m/r/g Pulmonary/Chest: normal work of breathing on room air, lungs clear to auscultation bilaterally Abdominal: soft, non-tender, non-distended MSK: normal bulk and tone. R leg edema unchanged from prior evals at the hospital. Neurological: alert & oriented x 3, no focal deficit Skin: warm and dry, right foot incision is healing well with no fluctuance or erythema, no purulence noted.  Psych: normal mood and behavior  Assessment & Plan:     Patient seen with Dr. Sol Blazing  Uncontrolled type 2 diabetes mellitus with hyperglycemia, with long-term current use of insulin (HCC) Currently on 60 units of Tresiba, 1000mg  of metformin BID, and has increased on his own Humalog to 7 units TID. He states he has done this because his glucose has been low, and thought that increasing his insulin would help him with this. He was educated that increasing his insulin would most likely cause him to have hypoglycemic events. Hgb A1C significantly improved at 8.7 earlier this month, it was 10.3 in August. He will need refills for both the long acting and the short acting insulin. Sending a refill along with pen  needles.  Reviewed CGM. He injects primarily around midnight and then has three large meals. Breakfast is around 9-11AM. Has has some episodes of hypoglycemia around lunch time which I believe is because he has increased his short acting insulin. He also has a pattern of nightime highs, sometimes around the 300s.   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  -Referral to opthalmology  Endorses numbness on his fingers, which I believe could be neuropathy. On gapabentin.   -Increase dose to 300mg  TID of gabapentin  -Cymbalta 60mg  ONCE daily  PAD (peripheral artery disease) (HCC) Has followed up with vascular today. Incision from amputation is clean, non-erythematous, non-fluctuant, with no purulent discharge. ABIs improved on the right. Has a follow up in 3 months.   -Continue plavix 75mg  per vascular  -FU with vascular in 3 months    Chest wall pain, chronic Has chest pain that will be pulsating sometimes gripping and sometimes numb that will radiate to his flank and back. It does not happen on exertion. Responsive to duloxetine. Because of this, he has been taking his duloxetine three times a day. He does have a history of CAD, but seems to be nerve related due to his improvement on cymbalta.   -Cw Cymbalta 60mg  ONCE daily  -increase gabapentin 300mg  TID (max dose for his CKD)  Tobacco use disorder Mr Apley has been working hard on quitting smoking. He has reduced his smoking to three cigarettes a day. He knows this is bad for him and would help him with his PAD. However, he attributes this to mainly habit. He has his routines including having coffee in the morning and a cigarette, along with those he has during lunch and dinner. He has tried nicotine gum  in the past with no success. Currently using patches, which he finds  helpful. Of note, this is a touchy subject for him. He is trying hard to quit. Praising him for his efforts will go a long way.   -Cw nicotine patches  -encouragement provided to quit and spoke about benefits of quitting    Essential hypertension BP today is at 113/58. Medications include amlodipine 5 mg, imdur 30 mg, and furosemide 40 mg.   -No changes to his regimen today.    [name], MD [facility] Internal Medicine, PGY-1 Phone: [phone] Date 07/08/2023 Time 9:26 AM

## 2023-07-08 DIAGNOSIS — F17200 Nicotine dependence, unspecified, uncomplicated: Secondary | ICD-10-CM | POA: Insufficient documentation

## 2023-07-08 MED ORDER — INSULIN DEGLUDEC 100 UNIT/ML ~~LOC~~ SOPN: 65.0000 [IU] | PEN_INJECTOR | Freq: Every day | SUBCUTANEOUS | 11 refills | Status: DC

## 2023-07-08 MED ORDER — BD PEN NEEDLE NANO U/F 32G X 4 MM MISC
1.0000 [IU] | Freq: Every day | 3 refills | Status: DC
Start: 2023-07-08 — End: 2023-10-04

## 2023-07-08 NOTE — Assessment & Plan Note (Signed)
Andres Richardson has been working hard on quitting smoking. He has reduced his smoking to three cigarettes a day. He knows this is bad for him and would help him with his PAD. However, he attributes this to mainly habit. He has his routines including having coffee in the morning and a cigarette, along with those he has during lunch and dinner. He has tried nicotine gum in the past with no success. Currently using patches, which he finds helpful. Of note, this is a touchy subject for him. He is trying hard to quit. Praising him for his efforts will go a long way.   -Cw nicotine patches  -encouragement provided to quit and spoke about benefits of quitting

## 2023-07-08 NOTE — Assessment & Plan Note (Addendum)
Currently on 60 units of Tresiba, 1000mg  of metformin BID, and has increased on his own Humalog to 7 units TID. He states he has done this because his glucose has been low, and thought that increasing his insulin would help him with this. He was educated that increasing his insulin would most likely cause him to have hypoglycemic events. Hgb A1C significantly improved at 8.7 earlier this month, it was 10.3 in August. He will need refills for both the long acting and the short acting insulin. Sending a refill along with pen needles.  Reviewed CGM. He injects primarily around midnight and then has three large meals. Breakfast is around 9-11AM. Has has some episodes of hypoglycemia around lunch time which I believe is because he has increased his short acting insulin. He also has a pattern of nightime highs, sometimes around the 300s.   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  -Referral to opthalmology  Endorses numbness on his fingers, which I believe could be neuropathy. On gapabentin.   -Increase dose to 300mg  TID of gabapentin  -Cymbalta 60mg  ONCE daily

## 2023-07-08 NOTE — Assessment & Plan Note (Signed)
Has followed up with vascular today. Incision from amputation is clean, non-erythematous, non-fluctuant, with no purulent discharge. ABIs improved on the right. Has a follow up in 3 months.   -Continue plavix 75mg  per vascular  -FU with vascular in 3 months

## 2023-07-08 NOTE — Assessment & Plan Note (Signed)
Has chest pain that will be pulsating sometimes gripping and sometimes numb that will radiate to his flank and back. It does not happen on exertion. Responsive to duloxetine. Because of this, he has been taking his duloxetine three times a day. He does have a history of CAD, but seems to be nerve related due to his improvement on cymbalta.   -Cw Cymbalta 60mg  ONCE daily  -increase gabapentin 300mg  TID (max dose for his CKD)

## 2023-07-08 NOTE — Assessment & Plan Note (Signed)
BP today is at 113/58. Medications include amlodipine 5 mg, imdur 30 mg, and furosemide 40 mg.   -No changes to his regimen today.

## 2023-07-12 ENCOUNTER — Ambulatory Visit (INDEPENDENT_AMBULATORY_CARE_PROVIDER_SITE_OTHER): Payer: Medicare Other | Admitting: Podiatry

## 2023-07-12 ENCOUNTER — Other Ambulatory Visit: Payer: Self-pay

## 2023-07-12 DIAGNOSIS — I739 Peripheral vascular disease, unspecified: Secondary | ICD-10-CM

## 2023-07-12 DIAGNOSIS — Z89431 Acquired absence of right foot: Secondary | ICD-10-CM

## 2023-07-12 NOTE — Progress Notes (Signed)
Subjective: Chief Complaint  Patient presents with   Routine Post Op    Rm#11 Right foot staple removal patient doing well.    57 year old male presents the office today for ongoing right transmetatarsal amputation on June 02, 2023.  He presents today for staple removal.  Has been doing well.  Pain is controlled.  He is finished his course of antibiotics.  He has been heel walking around the house in the cam boot.  No fevers or chills.  No other concerns.   Objective: AAO x3, NAD DP/PT pulses palpable bilaterally, CRT less than 3 seconds Status post transmetatarsal amputation.  Staples are still intact.  Incision appears to be healing well without any evidence of dehiscence.  Minimal scabbing present along the central/medial portion of the incision  no skin breakdown identified at this time.  There are some minimal edema but is no erythema, warmth.  There is no drainage or pus.  No fluctuation or crepitation.  There is no malodor.  No signs of infection.  T here is no surrounding erythema, ascending cellulitis.  No fluctuation or crepitation.  There is no malodor.  There is no drainage or pus.  No pain with calf compression, swelling, warmth, erythema   Assessment: Status post transmetatarsal amputation  Plan: -All treatment options discussed with the patient including all alternatives, risks, complications.  -I remove the remainder of the staples today.  Incisions healing well but I did apply a Steri-Strip for reinforcement.  Dressing and reapplied.  Discussed he can wash with soap and water. -He can start to transition to partial weightbearing to the heel in the cam boot.  Elevation.  Still needs to limit amount of weightbearing. -Monitor for any clinical signs or symptoms of infection and directed to call the office immediately should any occur or go to the ER.  Return in about 2 weeks (around 07/26/2023).  [name] DPM

## 2023-07-18 ENCOUNTER — Encounter (HOSPITAL_COMMUNITY)
Admission: RE | Admit: 2023-07-18 | Discharge: 2023-07-18 | Disposition: A | Discharge status: Home / Self Care | Payer: Medicare Other | Source: Ambulatory Visit | Attending: Internal Medicine | Admitting: Internal Medicine

## 2023-07-18 DIAGNOSIS — D509 Iron deficiency anemia, unspecified: Secondary | ICD-10-CM

## 2023-07-18 MED ORDER — SODIUM CHLORIDE 0.9 % IV SOLN
250.0000 mg | INTRAVENOUS | Status: DC
  Administered 2023-07-18: 250 mg via INTRAVENOUS
  Filled 2023-07-18: qty 250

## 2023-07-18 NOTE — Progress Notes (Signed)
Internal Medicine Clinic Attending  I was physically present during the key portions of the resident provided service and participated in the medical decision making of patient's management care. I reviewed pertinent patient test results.  The assessment, diagnosis, and plan were formulated together and I agree with the documentation in the resident's note.  Fasting glucose remains elevated with hypoglycemia following meals. Adjustments to insulin made today and I have scheduled an appointment with our diabetes educator Norm Parcel for f/u education.  Dickie La, MD

## 2023-07-18 NOTE — Addendum Note (Signed)
Addended by: Dickie La on: 07/18/2023 09:33 AM   Modules accepted: Level of Service

## 2023-07-25 ENCOUNTER — Encounter (HOSPITAL_COMMUNITY)
Admission: RE | Admit: 2023-07-25 | Discharge: 2023-07-25 | Disposition: A | Discharge status: Home / Self Care | Payer: Medicare Other | Source: Ambulatory Visit | Attending: Internal Medicine | Admitting: Internal Medicine

## 2023-07-25 ENCOUNTER — Other Ambulatory Visit (HOSPITAL_COMMUNITY): Payer: Self-pay

## 2023-07-25 DIAGNOSIS — D509 Iron deficiency anemia, unspecified: Secondary | ICD-10-CM

## 2023-07-25 MED ORDER — SODIUM CHLORIDE 0.9 % IV SOLN
250.0000 mg | INTRAVENOUS | Status: DC
  Administered 2023-07-25: 250 mg via INTRAVENOUS
  Filled 2023-07-25: qty 20

## 2023-07-26 ENCOUNTER — Other Ambulatory Visit: Payer: Self-pay

## 2023-07-26 MED ORDER — EZETIMIBE 10 MG PO TABS: 10.0000 mg | ORAL_TABLET | Freq: Every day | ORAL | 0 refills | Status: DC

## 2023-07-26 NOTE — Telephone Encounter (Signed)
Medication sent to pharmacy  

## 2023-07-27 ENCOUNTER — Encounter: Payer: Self-pay | Admitting: Dietician

## 2023-07-27 ENCOUNTER — Ambulatory Visit: Payer: Medicare Other | Admitting: Student

## 2023-07-27 ENCOUNTER — Encounter: Payer: Self-pay | Admitting: Student

## 2023-07-27 ENCOUNTER — Ambulatory Visit (INDEPENDENT_AMBULATORY_CARE_PROVIDER_SITE_OTHER): Payer: Medicare Other | Admitting: Dietician

## 2023-07-27 VITALS — BP 110/56 | HR 100 | Temp 97.9°F | Ht 71.0 in | Wt 215.8 lb

## 2023-07-27 DIAGNOSIS — Z794 Long term (current) use of insulin: Secondary | ICD-10-CM

## 2023-07-27 DIAGNOSIS — G4700 Insomnia, unspecified: Secondary | ICD-10-CM

## 2023-07-27 DIAGNOSIS — E1165 Type 2 diabetes mellitus with hyperglycemia: Secondary | ICD-10-CM | POA: Diagnosis present

## 2023-07-27 DIAGNOSIS — Z7984 Long term (current) use of oral hypoglycemic drugs: Secondary | ICD-10-CM | POA: Diagnosis not present

## 2023-07-27 NOTE — Patient Instructions (Addendum)
Thank you, Mr.Adeeb V Runnels for allowing [name] to provide your care today.   Follow up:  4 weeks     Remember:    For your sleep:   Avoid drinking tea or coffee after 4pm in the afternoon.  Buy the sleepy time or bedtime tea or chamomile tea  Have a structured schedule to your day. For example, from 9-11 I will have my breakfast, from 11-12 I will play with my cat, from 12-4pm I will do x.  Have a bedtime routine  Try to sleep only on designated times  Use your bedroom only for sleep  Do not exercise before going to bed but exercise during the day to keep you awake :)    Stop the short acting insulin  Continue with the 1000 mg of Metformin  Continue with the 65 units of Tresiba (long acting insulin) at night   Should you have any questions or concerns please call the internal medicine clinic at [phone].    Dr. Earley Brooke appointment is on Tuesday April 22 at 1:30 PM [address]  [place] [phone]  [name], MD [facility]

## 2023-07-27 NOTE — Progress Notes (Signed)
10:13 AM: Called to patient for his appointment. No answer. Left voicemail for return call Andres Richardson, RD 07/27/2023 10:22 AM.  Nutrition Therapy (in person from 1455 to 1525) Referred by Dr. Lyn Hollingshead  Assessment:  Primary concerns today: glycemic control.  Patient would like to get his diabetes under control so he can have surgery to have his colostomy reversed. He states he currently sleeps more duing the day but then some days he sleeps more at night. His intake is reasonable but suspect portions could be reduced of starchy foods.  Preferred Learning Style: No preference indicated  Learning Readiness: Ready and Change in progress  ANTHROPOMETRICS: Estimated body mass index is 30.1 kg/m as calculated from the following:   Height as of an earlier encounter on 07/27/23: 5\' 11"  (1.803 m).   Weight as of an earlier encounter on 07/27/23: 215 lb 12.8 oz (97.9 kg).  WEIGHT HISTORY:  Wt Readings from Last 10 Encounters:  07/27/23 215 lb 12.8 oz (97.9 kg)  07/25/23 210 lb (95.3 kg)  07/07/23 208 lb 11.2 oz (94.7 kg)  07/07/23 206 lb (93.4 kg)  06/22/23 208 lb 1.6 oz (94.4 kg)  06/06/23 205 lb 11 oz (93.3 kg)  05/12/23 197 lb 6.4 oz (89.5 kg)  03/16/23 218 lb (98.9 kg)  02/20/23 213 lb (96.6 kg)  02/16/23 216 lb (98 kg)    SLEEP:having difficulty getting on a schedule with sleep  MEDICATIONS: Tresiba 65 units daily about midnight in right side of abdomen, no lumps reported and his Humalog 5 units is 2-3 times a day and is 5-10 minutes before he heats.  BLOOD SUGAR: Lab Results  Component Value Date   HGBA1C 8.7 (A) 06/22/2023   HGBA1C 10.3 (H) 03/23/2023   HGBA1C 9.6 (A) 02/16/2023   HGBA1C 10.2 (A) 12/06/2022   HGBA1C 12.7 (A) 09/08/2022     DIETARY INTAKE: Usual eating pattern includes 2 meals and 2-3 snacks per day. Usual schedule: Sleeps after breakfast 9a- 1p, up all last night and the night before Midnight is when he tries to go to sleep and is when he takes long  acting insulin  Breakfast  9-10 am- bacon eggs,biscuit &OJ (8oz or sausage, whole English muffin with butter coffee  Snack if awake ~ 2x/week 1:00 PM handful Sun chips, 4-5 grapes, peanubutter sandwish, soup   Pasta once a month. For dinner  Dinner 5:-00- 7:00 PM  Baked chicken, rice pilaf,mashed potatoes and peas- he ate some of all that- diet coke.   Snack  1 hours later Iced tea 10-11  PM cheez balls- counts out 25, peanut butter saltines 5-6 sandwiches, grapes   Beverage(s) unsweet tea, black coffee, and water.    Usual physical activity: adls   Progress Towards Goal(s):  Some progress.   Nutritional Diagnosis:  [place]-2.3 Food-medication interaction As related to mismatch of food intake and insulin timing.  As evidenced by his CGM report and a1c.    Intervention:  Nutrition education about insulin timing, rotation os sites, will follow with portion review. Action Goal:consider taking Humalog 15-20 minutes before meals to see if this helps post prandial spikes  Outcome goal: I'm[proved blood glucose Coordination of care: discussed care with Dr. Lyn Hollingshead  Teaching Method Utilized: Visual, Auditory,Hands on Handouts given during visit include: After visit summary Barriers to learning/adherence to lifestyle change: competing values Demonstrated degree of understanding via:  Teach Back   Monitoring/Evaluation:  Dietary intake, exercise, meter, and body weight in 4 week(s) Andres Richardson, RD 07/27/2023 3:41 PM. .

## 2023-07-27 NOTE — Assessment & Plan Note (Signed)
Currently on 60 units of Tresiba, 1000mg  of metformin BID, and 5 units of humalog TID. Brings glucometer. Glucometer checked and patient is still having hypoglycemic events. Pt states that he has been having insomnia (see insomnia problem) and likely contributing to him not have a regular meal schedule. He misses lunch often, sometimes has breakfast around 9-11 and dinner around 6-8pm. States that he will get hypoglycemic around the time he injects his short acting insulin. Given his erratic schedule at this time and hypoglycemic events, it may be best to see what happens when he is only on the long acting insulin.   -DC short acting  -CW 65 units of Tresiba nightly  -Cw 1000mg  BID of metformin

## 2023-07-27 NOTE — Assessment & Plan Note (Signed)
Has not been able to have a full night of sleep in the last couple of weeks. He will sleep during the day, then go to bed at night. Drinks tea often. Will exercise to get himself tired to go to sleep. I do think this is largely to lack of a structure to his day and sleeping during the day. Uses TV but won't go to bed without it.  -Avoid using tea or coffee past 4pm  -Drink sleepy tea or chamomile tea  --Avoid exercise during night time  -Have a structured day  -use bed only for sleep

## 2023-07-27 NOTE — Progress Notes (Signed)
CC:  Chief Complaint  Patient presents with   Follow-up   HPI:  Andres Richardson is a 57 y.o. male living with a history stated below and presents today for follow up on his diabetes. Please see problem based assessment and plan for additional details.  Past Medical History:  Diagnosis Date   CAD (coronary artery disease) 2022     Prox RCA lesion is 100% stenosed.   Prox Cx to Mid Cx lesion is 60% stenosed.   LPAV lesion is 60% stenosed.   Ost LAD to Prox LAD lesion is 40% stenosed.   Diabetes mellitus without complication (HCC) 1995   Type !! 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 Diabetic neuropathy.   Fournier gangrene 2022   Grade I diastolic dysfunction 12/02/2021   Grade I diastolic dysfunction 12/02/2021   History of creation of ostomy [facility])    Hyperlipidemia    Mixed   Hypertension    Essential   Obesity    Peripheral arterial disease (HCC)    occluded left common iliac artery   Proteinuria    Tobacco use disorder     Current Outpatient Medications on File Prior to Visit  Medication Sig Dispense Refill   acetaminophen (TYLENOL) 500 MG tablet Take 2 tablets (1,000 mg total) by mouth every 6 (six) hours as needed for moderate pain (pain score 4-6). 30 tablet 0   amLODipine (NORVASC) 5 MG tablet Take 1 tablet (5 mg total) by mouth daily. 30 tablet 11   amoxicillin-clavulanate (AUGMENTIN) 875-125 MG tablet Take 1 tablet by mouth 2 (two) times daily. 14 tablet 0   aspirin EC 81 MG tablet Take 1 tablet (81 mg total) by mouth daily. Swallow whole. 90 tablet 3   Blood Glucose Monitoring Suppl DEVI May substitute to any manufacturer covered by patient's insurance. 1 each 0   calcium carbonate (TUMS - DOSED IN MG ELEMENTAL CALCIUM) 500 MG chewable tablet Chew 1 tablet by mouth at bedtime.     clopidogrel (PLAVIX) 75 MG tablet Take 1 tablet (75 mg total) by mouth daily. 30 tablet 6   Continuous Glucose Sensor (DEXCOM G7 SENSOR) MISC USE AS DIRECTED ONCE A WEEK 3 each  0   DULoxetine (CYMBALTA) 60 MG capsule Take 1 capsule (60 mg total) by mouth daily. 90 capsule 3   ezetimibe (ZETIA) 10 MG tablet Take 1 tablet (10 mg total) by mouth daily. 30 tablet 0   furosemide (LASIX) 40 MG tablet Take 1 tablet (40 mg total) by mouth daily. 90 tablet 1   gabapentin (NEURONTIN) 100 MG capsule Take 3 capsules (300 mg total) by mouth 3 (three) times daily. 540 capsule 5   gentamicin ointment (GARAMYCIN) 0.1 % Apply 1 Application topically daily. 30 g 3   Glucagon (GVOKE HYPOPEN 1-PACK) 1 MG/0.2ML SOAJ Inject 1 mg into the skin as needed (low blood sugar with impaired consciousness). 0.4 mL 2   Glucose Blood (BLOOD GLUCOSE TEST STRIPS) STRP 1 each by In Vitro route in the morning, at noon, and at bedtime. May substitute to any manufacturer covered by patient's insurance. 300 strip 3   Homeopathic Products (LEG CRAMPS) TABS Take 3 tablets by mouth daily as needed (leg cramps).     HYDROcodone-acetaminophen (NORCO/VICODIN) 5-325 MG tablet Take 1 tablet by mouth every 6 (six) hours as needed. 15 tablet 0   insulin degludec (TRESIBA) 100 UNIT/ML FlexTouch Pen Inject 65 Units into the skin at bedtime. 24 mL 11   Insulin Pen Needle (BD PEN NEEDLE  NANO U/F) 32G X 4 MM MISC 1 Units by Does not apply route at bedtime. 90 each 3   Iron, Ferrous Sulfate, 325 (65 Fe) MG TABS Take 1 tablet by mouth daily.     isosorbide mononitrate (IMDUR) 30 MG 24 hr tablet Take 1 tablet by mouth once daily 30 tablet 0   Lancet Device MISC 1 each by Does not apply route in the morning, at noon, and at bedtime. May substitute to any manufacturer covered by patient's insurance. 1 each 0   metFORMIN (GLUCOPHAGE) 1000 MG tablet Take 1 tablet (1,000 mg total) by mouth 2 (two) times daily with a meal. 180 tablet 1   montelukast (SINGULAIR) 10 MG tablet Take 1 tablet (10 mg total) by mouth daily. 30 tablet 2   Multiple Vitamin (MULTIVITAMIN WITH MINERALS) TABS tablet Take 1 tablet by mouth daily. 90 tablet 1    nitroGLYCERIN (NITROSTAT) 0.4 MG SL tablet Place 1 tablet (0.4 mg total) under the tongue every 5 (five) minutes as needed for chest pain. 20 tablet 3   olmesartan (BENICAR) 20 MG tablet Take 1 tablet (20 mg total) by mouth daily. 90 tablet 3   rosuvastatin (CRESTOR) 40 MG tablet Take 1 tablet (40 mg total) by mouth daily. 90 tablet 3   No current facility-administered medications on file prior to visit.    Family History  Problem Relation Age of Onset   CAD Mother    Colon cancer Father     Social History   Socioeconomic History   Marital status: Single    Spouse name: Not on file   Number of children: Not on file   Years of education: Not on file   Highest education level: Not on file  Occupational History   Not on file  Tobacco Use   Smoking status: Every Day    Current packs/day: 0.25    Types: Cigarettes   Smokeless tobacco: Never   Tobacco comments:    5 cigs per day/Uses Nicorette patch  Vaping Use   Vaping status: Never Used  Substance and Sexual Activity   Alcohol use: Never   Drug use: Never   Sexual activity: Not on file  Other Topics Concern   Not on file  Social History Narrative   Not on file   Social Determinants of Health   Financial Resource Strain: Low Risk  (02/16/2023)   Overall Financial Resource Strain (CARDIA)    Difficulty of Paying Living Expenses: Not hard at all  Food Insecurity: No Food Insecurity (05/27/2023)   Hunger Vital Sign    Worried About Running Out of Food in the Last Year: Never true    Ran Out of Food in the Last Year: Never true  Transportation Needs: No Transportation Needs (05/27/2023)   PRAPARE - Administrator, Civil Service (Medical): No    Lack of Transportation (Non-Medical): No  Physical Activity: Insufficiently Active (02/16/2023)   Exercise Vital Sign    Days of Exercise per Week: 3 days    Minutes of Exercise per Session: 20 min  Stress: No Stress Concern Present (02/16/2023)   [employer] of  Occupational Health - Occupational Stress Questionnaire    Feeling of Stress : Not at all  Social Connections: Moderately Isolated (02/16/2023)   Social Connection and Isolation Panel [NHANES]    Frequency of Communication with Friends and Family: More than three times a week    Frequency of Social Gatherings with Friends and Family: Three times a week  Attends Religious Services: 1 to 4 times per year    Active Member of Clubs or Organizations: No    Attends Banker Meetings: Never    Marital Status: Divorced  Catering manager Violence: Not At Risk (05/27/2023)   Humiliation, Afraid, Rape, and Kick questionnaire    Fear of Current or Ex-Partner: No    Emotionally Abused: No    Physically Abused: No    Sexually Abused: No    Review of Systems: ROS negative except for what is noted on the assessment and plan.  Vitals:   07/27/23 1428  BP: (!) 110/56  Pulse: 100  Temp: 97.9 F (36.6 C)  TempSrc: Oral  SpO2: 100%  Weight: 215 lb 12.8 oz (97.9 kg)  Height: 5\' 11"  (1.803 m)    Physical Exam: Constitutional: well-appearing, in no acute distress HENT: normocephalic atraumatic, mucous membranes moist Eyes: conjunctiva non-erythematous Cardiovascular: regular rate and rhythm, no m/r/g Pulmonary/Chest: normal work of breathing on room air, lungs clear to auscultation bilaterally Abdominal: soft, non-tender, non-distended MSK: normal bulk and tone Neurological: alert & oriented x 3, no focal deficit Skin: warm and dry Psych: normal mood and behavior  Assessment & Plan:   Patient discussed with Dr. Mayford Knife  Uncontrolled type 2 diabetes mellitus with hyperglycemia, with long-term current use of insulin (HCC) Currently on 60 units of Tresiba, 1000mg  of metformin BID, and 5 units of humalog TID. Brings glucometer. Glucometer checked and patient is still having hypoglycemic events. Pt states that he has been having insomnia (see insomnia problem) and likely  contributing to him not have a regular meal schedule. He misses lunch often, sometimes has breakfast around 9-11 and dinner around 6-8pm. States that he will get hypoglycemic around the time he injects his short acting insulin. Given his erratic schedule at this time and hypoglycemic events, it may be best to see what happens when he is only on the long acting insulin.   -DC short acting  -CW 65 units of Tresiba nightly  -Cw 1000mg  BID of metformin   Insomnia Has not been able to have a full night of sleep in the last couple of weeks. He will sleep during the day, then go to bed at night. Drinks tea often. Will exercise to get himself tired to go to sleep. I do think this is largely to lack of a structure to his day and sleeping during the day. Uses TV but won't go to bed without it.  -Avoid using tea or coffee past 4pm  -Drink sleepy tea or chamomile tea  --Avoid exercise during night time  -Have a structured day  -use bed only for sleep    [name], MD [facility] Internal Medicine, PGY-1 Phone: [phone] Date 07/27/2023 Time 5:20 PM

## 2023-07-27 NOTE — Patient Instructions (Addendum)
Dr. Earley Brooke appointment is on Tuesday April 22 at 1:30 PM [address] [place] [phone]

## 2023-07-28 ENCOUNTER — Ambulatory Visit (INDEPENDENT_AMBULATORY_CARE_PROVIDER_SITE_OTHER): Payer: Medicare Other | Admitting: Podiatry

## 2023-07-28 DIAGNOSIS — L089 Local infection of the skin and subcutaneous tissue, unspecified: Secondary | ICD-10-CM

## 2023-07-28 DIAGNOSIS — E11628 Type 2 diabetes mellitus with other skin complications: Secondary | ICD-10-CM

## 2023-07-28 DIAGNOSIS — Z89431 Acquired absence of right foot: Secondary | ICD-10-CM

## 2023-07-28 NOTE — Progress Notes (Signed)
Internal Medicine Clinic Attending  Case discussed with the resident at the time of the visit.  We reviewed the resident's history and exam and pertinent patient test results.  I agree with the assessment, diagnosis, and plan of care documented in the resident's note, including at least temporary cessation of short acting insulin given his inconsistent eating schedule; at f/u we can determine if/how to dose the meal time regimen.
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
## 2023-07-28 NOTE — Progress Notes (Signed)
Subjective: No chief complaint on file.    57 year old male presents the office today for ongoing right transmetatarsal amputation on June 02, 2023.  States he is doing well.  He is still in the cam boot.  Some weight on the left foot in the boot.  Does not report any drainage or pus.  No increase in swelling or redness.  No fever or chills.  No other concerns.    Objective: AAO x3, NAD DP/PT pulses palpable bilaterally, CRT less than 3 seconds Status post transmetatarsal amputation.  Still present on the area of the incision but overall incision appears to be healing nicely.  There is some mild edema still present there is no erythema.  There is no open lesions identified.  Equinus is present. No pain with calf compression, swelling, warmth, erythema  Assessment: Status post transmetatarsal amputation  Plan: -All treatment options discussed with the patient including all alternatives, risks, complications.  -We discussed the conservative transition to weightbearing in cam boot.  Ultimately discussed range of motion exercises for ankle arthritis however we discussed with him possible tendo Achilles lengthening given the equinus of this is not improve or if he develops any breakdown of the foot due to this.  Discussed moisturizer. -Discussed formal PT -Monitor for any clinical signs or symptoms of infection and directed to call the office immediately should any occur or go to the ER.  Return in about 4 weeks (around 08/25/2023) for follow up from amputation; also for inserts.  [name] DPM

## 2023-08-01 ENCOUNTER — Encounter (HOSPITAL_COMMUNITY)
Admission: RE | Admit: 2023-08-01 | Discharge: 2023-08-01 | Disposition: A | Discharge status: Home / Self Care | Payer: Medicare Other | Source: Ambulatory Visit | Attending: Internal Medicine | Admitting: Internal Medicine

## 2023-08-01 DIAGNOSIS — D509 Iron deficiency anemia, unspecified: Secondary | ICD-10-CM

## 2023-08-01 MED ORDER — SODIUM CHLORIDE 0.9 % IV SOLN
250.0000 mg | INTRAVENOUS | Status: DC
  Administered 2023-08-01: 250 mg via INTRAVENOUS
  Filled 2023-08-01: qty 250

## 2023-08-04 ENCOUNTER — Other Ambulatory Visit (HOSPITAL_COMMUNITY): Payer: Self-pay | Admitting: Nephrology

## 2023-08-04 ENCOUNTER — Other Ambulatory Visit: Payer: Self-pay | Admitting: Internal Medicine

## 2023-08-04 DIAGNOSIS — E7800 Pure hypercholesterolemia, unspecified: Secondary | ICD-10-CM

## 2023-08-04 DIAGNOSIS — R809 Proteinuria, unspecified: Secondary | ICD-10-CM

## 2023-08-04 DIAGNOSIS — E119 Type 2 diabetes mellitus without complications: Secondary | ICD-10-CM

## 2023-08-04 DIAGNOSIS — I25119 Atherosclerotic heart disease of native coronary artery with unspecified angina pectoris: Secondary | ICD-10-CM

## 2023-08-04 DIAGNOSIS — I5033 Acute on chronic diastolic (congestive) heart failure: Secondary | ICD-10-CM

## 2023-08-04 DIAGNOSIS — G4733 Obstructive sleep apnea (adult) (pediatric): Secondary | ICD-10-CM

## 2023-08-04 DIAGNOSIS — I10 Essential (primary) hypertension: Secondary | ICD-10-CM

## 2023-08-04 DIAGNOSIS — I739 Peripheral vascular disease, unspecified: Secondary | ICD-10-CM

## 2023-08-04 DIAGNOSIS — Z794 Long term (current) use of insulin: Secondary | ICD-10-CM

## 2023-08-08 ENCOUNTER — Encounter: Payer: Self-pay | Admitting: Student

## 2023-08-08 ENCOUNTER — Encounter: Payer: Self-pay | Admitting: Dietician

## 2023-08-08 ENCOUNTER — Ambulatory Visit: Payer: Medicare Other | Attending: Student | Admitting: Student

## 2023-08-08 VITALS — BP 130/66 | HR 96 | Ht 71.0 in | Wt 222.2 lb

## 2023-08-08 DIAGNOSIS — E118 Type 2 diabetes mellitus with unspecified complications: Secondary | ICD-10-CM | POA: Diagnosis present

## 2023-08-08 DIAGNOSIS — Z01818 Encounter for other preprocedural examination: Secondary | ICD-10-CM | POA: Insufficient documentation

## 2023-08-08 DIAGNOSIS — I5032 Chronic diastolic (congestive) heart failure: Secondary | ICD-10-CM | POA: Diagnosis present

## 2023-08-08 DIAGNOSIS — R0989 Other specified symptoms and signs involving the circulatory and respiratory systems: Secondary | ICD-10-CM | POA: Diagnosis present

## 2023-08-08 DIAGNOSIS — I2510 Atherosclerotic heart disease of native coronary artery without angina pectoris: Secondary | ICD-10-CM | POA: Diagnosis not present

## 2023-08-08 DIAGNOSIS — I739 Peripheral vascular disease, unspecified: Secondary | ICD-10-CM | POA: Insufficient documentation

## 2023-08-08 DIAGNOSIS — I10 Essential (primary) hypertension: Secondary | ICD-10-CM | POA: Insufficient documentation

## 2023-08-08 DIAGNOSIS — R0789 Other chest pain: Secondary | ICD-10-CM | POA: Diagnosis not present

## 2023-08-08 DIAGNOSIS — E785 Hyperlipidemia, unspecified: Secondary | ICD-10-CM | POA: Insufficient documentation

## 2023-08-08 DIAGNOSIS — Z794 Long term (current) use of insulin: Secondary | ICD-10-CM | POA: Insufficient documentation

## 2023-08-08 DIAGNOSIS — N1831 Chronic kidney disease, stage 3a: Secondary | ICD-10-CM | POA: Insufficient documentation

## 2023-08-08 NOTE — Patient Instructions (Addendum)
Medication Instructions:  Your physician recommends that you continue on your current medications as directed. Please refer to the Current Medication list given to you today.  *If you need a refill on your cardiac medications before your next appointment, please call your pharmacy*   Lab Work: NONE ordered at this time of appointment   Testing/Procedures: Your physician has requested that you have a carotid duplex. This test is an ultrasound of the carotid arteries in your neck. It looks at blood flow through these arteries that supply the brain with blood. Allow one hour for this exam. There are no restrictions or special instructions.   Follow-Up: At [facility], you and your health needs are our priority.  As part of our continuing mission to provide you with exceptional heart care, we have created designated Provider Care Teams.  These Care Teams include your primary Cardiologist (physician) and Advanced Practice Providers (APPs -  Physician Assistants and Nurse Practitioners) who all work together to provide you with the care you need, when you need it.  We recommend signing up for the patient portal called "MyChart".  Sign up information is provided on this After Visit Summary.  MyChart is used to connect with patients for Virtual Visits (Telemedicine).  Patients are able to view lab/test results, encounter notes, upcoming appointments, etc.  Non-urgent messages can be sent to your provider as well.   To learn more about what you can do with MyChart, go to [email or web address].    Your next appointment:   6 month(s)  Provider:   Parke Poisson, MD  or Marjie Skiff, PA-C        Other Instructions         Heart Failure Education: Weigh yourself EVERY morning after you go to the bathroom but before you eat or drink anything. Write this number down in a weight log/diary. If you gain 3 pounds overnight or 5 pounds in a week, call the office. Take your medicines as  prescribed. If you have concerns about your medications, please call us before you stop taking them.  Eat low salt foods--Limit salt (sodium) to 2000 mg per day. This will help prevent your body from holding onto fluid. Read food labels as many processed foods have a lot of sodium, especially canned goods and prepackaged meats. If you would like some assistance choosing low sodium foods, we would be happy to set you up with a nutritionist. Limit all fluids for the day to less than 2 liters (64 ounces). Fluid includes all drinks, coffee, juice, ice chips, soup, jello, and all other liquids. Stay as active as you can everyday. Staying active will give you more energy and make your muscles stronger. Start with 5 minutes at a time and work your way up to 30 minutes a day. Break up your activities--do some in the morning and some in the afternoon. Start with 3 days per week and work your way up to 5 days as you can.  If you have chest pain, feel short of breath, dizzy, or lightheaded, STOP. If you don't feel better after a short rest, call 911. If you do feel better, call the office to let us know you have symptoms with exercise.

## 2023-08-08 NOTE — Progress Notes (Signed)
[name], MD  Claudean Kinds Sure. Would like ASA left on if possible. If you can't I understand. Please restart ASAP       Previous Messages    ----- Message ----- From: Claudean Kinds Sent: 08/04/2023   1:02 PM EST To: Claudean Kinds; [name], MD Subject: Hold request for plavix                        Hi Dr. Karin Lieu ,  Patient is scheduled for a kidney biopsy on 08/29/23 . Requesting to hold asprin and Plavix 5 days prior to procedure ?  Thanks ,  Jaz Mallick X. [facility]  Centralized Scheduling for [facility] Phone : [phone]  Fax: [phone]

## 2023-08-18 ENCOUNTER — Ambulatory Visit (INDEPENDENT_AMBULATORY_CARE_PROVIDER_SITE_OTHER): Payer: Medicare Other | Admitting: Podiatry

## 2023-08-18 ENCOUNTER — Encounter: Payer: Self-pay | Admitting: Podiatry

## 2023-08-18 DIAGNOSIS — L089 Local infection of the skin and subcutaneous tissue, unspecified: Secondary | ICD-10-CM

## 2023-08-18 DIAGNOSIS — E11628 Type 2 diabetes mellitus with other skin complications: Secondary | ICD-10-CM

## 2023-08-18 DIAGNOSIS — Z89431 Acquired absence of right foot: Secondary | ICD-10-CM

## 2023-08-18 NOTE — Patient Instructions (Signed)
 Monitor for any signs/symptoms of infection. Call the office immediately if any occur or go directly to the emergency room. Call with any questions/concerns.

## 2023-08-21 NOTE — Progress Notes (Signed)
 Subjective: Chief Complaint  Patient presents with   Routine Post Op    RM#14 follow up from amputation; also for inserts patient states no issues doing well.    58 year old male presents the office today for ongoing right transmetatarsal amputation on June 02, 2023.  States he is doing well.  He has noticed swelling just recently but he states that over the holiday she was eating ham and other salty foods which cause increased swelling.  He has no pain associated with swelling.  No open lesions that he reports.  No fevers or chills.  No chest pain or shortness of breath.   Objective: AAO x3, NAD DP/PT pulses palpable bilaterally, CRT less than 3 seconds Status post transmetatarsal amputation.  Incision appears to be healing well.  There is an area centrally adipose tissue which is almost completely off.  There is no definitive skin breakdown but the area is preulcerative.  There is no surrounding erythema, ascending cellulitis.  No fluctuation or crepitation.  There is no malodor. Bilateral pitting edema present. No pain with calf compression, swelling, warmth, erythema  Assessment: Status post transmetatarsal amputation  Plan: -All treatment options discussed with the patient including all alternatives, risks, complications.  -Swelling likely due to increased salt intake, diet.  Encouraged elevation if no improvement to let me know.  Monitor any signs or symptoms of DVT but clinically there is no pain associated with this point swelling or heat. -Debrided the loose hyperkeratotic tissue along the incision.  The area is about a quarter size.  I would keep a small antibiotic ointment dressing.  Also hopefully controlling the swelling will be beneficial.  Monitor any signs or symptoms of infection or skin breakdown. -Continue CAM boot for now -Monitor for any clinical signs or symptoms of infection and directed to call the office immediately should any occur or go to the ER.  Return in  about 10 days (around 08/28/2023) for wound check and also with Tricia for insert.  Donnice JONELLE Fees DPM

## 2023-08-26 ENCOUNTER — Ambulatory Visit (HOSPITAL_COMMUNITY): Payer: Medicare Other

## 2023-08-26 NOTE — Progress Notes (Deleted)
 Chief Complaint: Patient was seen in consultation today for nephrotic range proteinuria  at the request of Singh,Vikas  Referring Physician(s): Singh,Vikas  Supervising Physician: Hughes Simmonds  Patient Status: [facility] - Out-pt  History of Present Illness: Andres Richardson is a 58 y.o. male with PMHs of HTN, CAD, DM, PAD, CKD 2 and nephrotic range proteinuria who presents for random renal bx.  Patient has been followed by [facility] for CKD and nephrotic proteinuria whih is most likely due to uncontrolled DM. During recent visit with nephrology a random renal bx for further work up was recommended which he decided to proceed.   Of note, patient has hx of CAD/PAD and s/p DES to right SFA in 05/2023 vascular surgery and transmetatarsal amputation of the right foot in 05/2023, patient is on DAPT with ASA 81 mg and Plavix  75 mg. Per nephrology note, cardiology was consulted to see if DAPT can be held for the renal bx, cardiology notes from 08/08/23 states that continue DAPT per vascular surgery. There is no note from vascular surgery in EMR since 07/07/23, which states patient should be on DAPT.   Patient laying in bed, not in acute distress. Friend at bedside.  Denise headache, fever, chills, shortness of breath, cough, chest pain, abdominal pain, nausea ,vomiting, and bleeding.  Informed the patient that his BP is high and will need to bring it down little bit to perform the biopsy safely. He states that he did not take his BP meds last night and he is nervous, his BP is usually good.    Past Medical History:  Diagnosis Date   CAD (coronary artery disease) 2022     Prox RCA lesion is 100% stenosed.   Prox Cx to Mid Cx lesion is 60% stenosed.   LPAV lesion is 60% stenosed.   Ost LAD to Prox LAD lesion is 40% stenosed.   Diabetes mellitus without complication (HCC) 1995   Type !! 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 Diabetic neuropathy.   Fournier gangrene 2022    Grade I diastolic dysfunction 12/02/2021   Grade I diastolic dysfunction 12/02/2021   History of creation of ostomy [facility])    Hyperlipidemia    Mixed   Hypertension    Essential   Obesity    Peripheral arterial disease (HCC)    occluded left common iliac artery   Proteinuria    Tobacco use disorder     Past Surgical History:  Procedure Laterality Date   BIOPSY  12/04/2021   Procedure: BIOPSY;  Surgeon: Saintclair Jasper, MD;  Location: [facility]OS[facility];  Service: Gastroenterology;;   ESOPHAGOGASTRODUODENOSCOPY (EGD) WITH PROPOFOL  N/A 12/04/2021   Procedure: ESOPHAGOGASTRODUODENOSCOPY (EGD) WITH PROPOFOL ;  Surgeon: Saintclair Jasper, MD;  Location: [facility] ENDOS[facility];  Service: Gastroenterology;  Laterality: N/A;   INCISION AND DRAINAGE ABSCESS N/A 02/24/2021   Procedure: INCISION AND DRAINAGE PERINEUM;  Surgeon: Stevie, Herlene Righter, MD;  Location: [facility] ORS;  Service: General;  Laterality: N/A;   INCISION AND DRAINAGE ABSCESS N/A 02/25/2021   Procedure: REPEAT WASHOUT OF PERINEUM;  Surgeon: Stevie Herlene Righter, MD;  Location: [facility] ORS;  Service: General;  Laterality: N/A;   LAPAROSCOPIC DIVERTED COLOSTOMY N/A 02/25/2021   Procedure: LAPAROSCOPIC DIVERTING SIGMOID COLOSTOMY;  Surgeon: Stevie Herlene Righter, MD;  Location: [facility] ORS;  Service: General;  Laterality: N/A;   LEFT HEART CATH AND CORONARY ANGIOGRAPHY N/A 05/18/2021   Procedure: LEFT HEART CATH AND CORONARY ANGIOGRAPHY;  Surgeon: Court Dorn PARAS, MD;  Location: [facility] INVASIVE CV LAB;  Service: Cardiovascular;  Laterality: N/A;   LOWER EXTREMITY ANGIOGRAPHY Right 06/01/2023   Procedure: Lower Extremity Angiography;  Surgeon: Lanis Fonda BRAVO, MD;  Location: [facility] INVASIVE CV LAB;  Service: Cardiovascular;  Laterality: Right;   PERIPHERAL VASCULAR INTERVENTION Right 06/01/2023   Procedure: PERIPHERAL VASCULAR INTERVENTION;  Surgeon: Lanis Fonda BRAVO, MD;  Location: [facility] INVASIVE CV LAB;  Service: Cardiovascular;  Laterality: Right;  R SFA   TRANSMETATARSAL  AMPUTATION Right 06/02/2023   Procedure: TRANSMETATARSAL AMPUTATION;  Surgeon: Gershon Donnice SAUNDERS, DPM;  Location: [facility] OR;  Service: Orthopedics/Podiatry;  Laterality: Right;    Allergies: Adhesive [tape]  Medications: Prior to Admission medications   Medication Sig Start Date End Date Taking? Authorizing Provider  acetaminophen  (TYLENOL ) 500 MG tablet Take 2 tablets (1,000 mg total) by mouth every 6 (six) hours as needed for moderate pain (pain score 4-6). 06/06/23   Alexander-Savino, [place], MD  amLODipine  (NORVASC ) 5 MG tablet Take 1 tablet (5 mg total) by mouth daily. 03/16/23 03/15/24  Marylu Gee, DO  amoxicillin -clavulanate (AUGMENTIN ) 875-125 MG tablet Take 1 tablet by mouth 2 (two) times daily. 06/30/23   Gershon Donnice SAUNDERS, DPM  aspirin  EC 81 MG tablet Take 1 tablet (81 mg total) by mouth daily. Swallow whole. 02/16/23   [name], [name], DO  Blood Glucose Monitoring Suppl DEVI May substitute to any manufacturer covered by patient's insurance. 02/16/23   [name], [name], DO  calcium  carbonate (TUMS - DOSED IN MG ELEMENTAL CALCIUM ) 500 MG chewable tablet Chew 1 tablet by mouth at bedtime.    [provider]  clopidogrel  (PLAVIX ) 75 MG tablet Take 1 tablet (75 mg total) by mouth daily. 07/07/23   Lanis Fonda BRAVO, MD  Continuous Glucose Sensor (DEXCOM G7 SENSOR) MISC USE AS DIRECTED ONCE A WEEK 01/12/23   Lemon Raisin, MD  DULoxetine  (CYMBALTA ) 60 MG capsule Take 1 capsule (60 mg total) by mouth daily. 02/16/23 02/16/24  [name], [name], DO  ezetimibe  (ZETIA ) 10 MG tablet Take 1 tablet (10 mg total) by mouth daily. 07/26/23 08/25/23  [name], Izetta, DO  furosemide  (LASIX ) 40 MG tablet Take 1 tablet by mouth once daily 08/04/23   [name], Izetta, DO  gabapentin  (NEURONTIN ) 100 MG capsule Take 3 capsules (300 mg total) by mouth 3 (three) times daily. 07/07/23   Alexander-Savino, Washington, MD  gentamicin  ointment (GARAMYCIN ) 0.1 % Apply 1 Application topically daily. 04/26/23   McDonald, Juliene SAUNDERS, DPM  Glucagon  (GVOKE HYPOPEN  1-PACK) 1 MG/0.2ML SOAJ Inject 1 mg into the skin as needed (low blood sugar with impaired consciousness). 03/28/23   Motwani, Komal, MD  Glucose Blood (BLOOD GLUCOSE TEST STRIPS) STRP 1 each by In Vitro route in the morning, at noon, and at bedtime. May substitute to any manufacturer covered by patient's insurance. 02/16/23   [name], Izetta, DO  Homeopathic Products (LEG CRAMPS) TABS Take 3 tablets by mouth daily as needed (leg cramps).    [provider]  HYDROcodone -acetaminophen  (NORCO/VICODIN) 5-325 MG tablet Take 1 tablet by mouth every 6 (six) hours as needed. 06/13/23   Gershon Donnice SAUNDERS, DPM  insulin  degludec (TRESIBA ) 100 UNIT/ML FlexTouch Pen Inject 65 Units into the skin at bedtime. 07/08/23   Volney, Washington, MD  Insulin  Pen Needle (BD PEN NEEDLE NANO U/F) 32G X 4 MM MISC 1 Units by Does not apply route at bedtime. 07/08/23   Alexander-Savino, Washington, MD  Iron, Ferrous Sulfate , 325 (65 Fe) MG TABS Take 1 tablet by mouth daily. 06/02/21   [provider]  isosorbide  mononitrate (IMDUR ) 30 MG 24  hr tablet Take 1 tablet by mouth once daily 06/30/23   [name], Izetta, DO  Lancet Device MISC 1 each by Does not apply route in the morning, at noon, and at bedtime. May substitute to any manufacturer covered by patient's insurance. 02/16/23   [name], Izetta, DO  metFORMIN  (GLUCOPHAGE ) 1000 MG tablet Take 1 tablet (1,000 mg total) by mouth 2 (two) times daily with a meal. 09/21/22 05/26/24  Gawaluck, Greylon, MD  montelukast  (SINGULAIR ) 10 MG tablet Take 1 tablet (10 mg total) by mouth daily. 09/21/22 02/18/24  Gawaluck, Greylon, MD  Multiple Vitamin (MULTIVITAMIN WITH MINERALS) TABS tablet Take 1 tablet by mouth daily. 03/10/21   Gonfa, Taye T, MD  nitroGLYCERIN  (NITROSTAT ) 0.4 MG SL tablet Place 1 tablet (0.4 mg total) under the tongue every 5 (five) minutes as needed for chest pain. 02/17/23   [name], [name], DO  olmesartan  (BENICAR ) 20 MG tablet  Take 1 tablet (20 mg total) by mouth daily. 07/07/23   Alexander-Savino, Washington, MD  rosuvastatin  (CRESTOR ) 40 MG tablet Take 1 tablet (40 mg total) by mouth daily. 05/12/23   Loni Soyla LABOR, MD     Family History  Problem Relation Age of Onset   CAD Mother    Colon cancer Father     Social History   Socioeconomic History   Marital status: Single    Spouse name: Not on file   Number of children: Not on file   Years of education: Not on file   Highest education level: Not on file  Occupational History   Not on file  Tobacco Use   Smoking status: Every Day    Current packs/day: 0.25    Types: Cigarettes   Smokeless tobacco: Never   Tobacco comments:    5 cigs per day/Uses Nicorette patch  Vaping Use   Vaping status: Never Used  Substance and Sexual Activity   Alcohol use: Never   Drug use: Never   Sexual activity: Not on file  Other Topics Concern   Not on file  Social History Narrative   Not on file   Social Drivers of Health   Financial Resource Strain: Low Risk  (02/16/2023)   Overall Financial Resource Strain (CARDIA)    Difficulty of Paying Living Expenses: Not hard at all  Food Insecurity: No Food Insecurity (05/27/2023)   Hunger Vital Sign    Worried About Running Out of Food in the Last Year: Never true    Ran Out of Food in the Last Year: Never true  Transportation Needs: No Transportation Needs (05/27/2023)   PRAPARE - Administrator, Civil Service (Medical): No    Lack of Transportation (Non-Medical): No  Physical Activity: Insufficiently Active (02/16/2023)   Exercise Vital Sign    Days of Exercise per Week: 3 days    Minutes of Exercise per Session: 20 min  Stress: No Stress Concern Present (02/16/2023)   [employer] of Occupational Health - Occupational Stress Questionnaire    Feeling of Stress : Not at all  Social Connections: Moderately Isolated (02/16/2023)   Social Connection and Isolation Panel [NHANES]    Frequency of  Communication with Friends and Family: More than three times a week    Frequency of Social Gatherings with Friends and Family: Three times a week    Attends Religious Services: 1 to 4 times per year    Active Member of Clubs or Organizations: No    Attends Banker Meetings: Never    Marital Status:  Divorced     Review of Systems: A 12 point ROS discussed and pertinent positives are indicated in the HPI above.  All other systems are negative.  Vital Signs: BP (!) 179/72 Comment: 20mg  IV Labetolol given as ordered.  Pulse 89   Temp 98.5 F (36.9 C) (Oral)   Resp 18   Ht 5' 11 (1.803 m)   Wt 220 lb 7.4 oz (100 kg)   SpO2 99%   BMI 30.75 kg/m    Physical Exam Vitals and nursing note reviewed.  Constitutional:      General: Patient is not in acute distress.    Appearance: Normal appearance. Patient is not ill-appearing.  HENT:     Head: Normocephalic and atraumatic.     Mouth/Throat:     Mouth: Mucous membranes are moist.     Pharynx: Oropharynx is clear.  Cardiovascular:     Rate and Rhythm: Normal rate and regular rhythm.     Pulses: Normal pulses.     Heart sounds: Normal heart sounds.  Pulmonary:     Effort: Pulmonary effort is normal.     Breath sounds: Normal breath sounds.  Abdominal:     General: Abdomen is flat. Bowel sounds are normal.     Palpations: Abdomen is soft.  Musculoskeletal:     Cervical back: Neck supple.  Skin:    General: Skin is warm and dry.     Coloration: Skin is not jaundiced or pale.  Neurological:     Mental Status: Patient is alert and oriented to person, place, and time.  Psychiatric:        Mood and Affect: Mood normal.        Behavior: Behavior normal.        Judgment: Judgment normal.    MD Evaluation Airway: WNL Heart: WNL Abdomen: WNL Chest/ Lungs: WNL ASA  Classification: 3 Mallampati/Airway Score: One  Imaging: No results found.  Labs:  CBC: Recent Labs    06/05/23 1436 06/06/23 0856  06/22/23 1047 08/29/23 0649  WBC 6.4 5.4 6.7 7.1  HGB 7.1* 7.2* 9.3* 11.5*  HCT 22.4* 23.0* 30.3* 36.3*  PLT 238 257 256 218    COAGS: Recent Labs    08/29/23 0649  INR 1.0    BMP: Recent Labs    06/03/23 0639 06/04/23 0554 06/05/23 1259 06/06/23 0856 06/22/23 1047  NA 137 139 138 137 146*  K 3.8 3.5 4.7 3.9 4.4  CL 111 107 112* 113* 108*  CO2 19* 18* 16* 19* 24  GLUCOSE 163* 80 125* 154* 119*  BUN 25* 21* 21* 21* 15  CALCIUM  7.7* 8.9 8.1* 7.8* 7.8*  CREATININE 1.51* 1.29* 1.46* 1.38* 1.17  GFRNONAA 54* >60 56* 60*  --     LIVER FUNCTION TESTS: Recent Labs    10/05/22 1515 10/19/22 1617 01/19/23 1632 02/20/23 0058 03/23/23 1058 05/26/23 2025 05/30/23 0423 06/02/23 0355 06/03/23 0639 06/04/23 0554 06/06/23 0856  BILITOT <0.2  --  <0.2  --  0.3 0.6  --   --   --   --   --   AST 11  --  9  --  10 7*  --   --   --   --   --   ALT 8  --  8  --  8 7  --   --   --   --   --   ALKPHOS 86  --  94  --  84 81  --   --   --   --   --  PROT 6.1 6.0 6.1  --  6.9 6.3*  --   --   --   --   --   ALBUMIN  3.3*  --  3.4*   < > 3.3* 2.0*   < > 1.9* 1.8* 1.9* 1.9*   < > = values in this interval not displayed.    TUMOR MARKERS: No results for input(s): AFPTM, CEA, CA199, CHROMGRNA in the last 8760 hours.  Assessment and Plan: 58 y.o. male with DM, CKD, and nephrotic range proteinuria who presents for random renal bx.   NPO since MN VSS 179/72, 20 mg IV labetalol  ordered per Dr. Hughes.  CBC stable INR 1.0 On ASA 81 mg anfd Plavix  75 mg, has been held for 7 days   Risks and benefits of random renal bx  was discussed with the patient and/or patient's family including, but not limited to bleeding, infection, damage to adjacent structures or low yield requiring additional tests.  All of the questions were answered and there is agreement to proceed.  Consent signed and in chart.     Thank you for this interesting consult.  I greatly enjoyed meeting Andres Richardson and look forward to participating in their care.  A copy of this report was sent to the requesting provider on this date.  Electronically Signed: Toya VEAR Cousin, PA-C 08/29/2023, 7:45 AM   I spent a total of   30 min in face to face in clinical consultation, greater than 50% of which was counseling/coordinating care for random renal bx.  This chart was dictated using voice recognition software.  Despite best efforts to proofread,  errors can occur which can change the documentation meaning.

## 2023-08-27 ENCOUNTER — Other Ambulatory Visit (HOSPITAL_COMMUNITY): Payer: Self-pay | Admitting: Student

## 2023-08-27 DIAGNOSIS — E0821 Diabetes mellitus due to underlying condition with diabetic nephropathy: Secondary | ICD-10-CM

## 2023-08-29 ENCOUNTER — Ambulatory Visit (HOSPITAL_COMMUNITY)
Admission: RE | Admit: 2023-08-29 | Discharge: 2023-08-29 | Disposition: A | Discharge status: Home / Self Care | Payer: Medicare Other | Source: Ambulatory Visit | Attending: Nephrology | Admitting: Nephrology

## 2023-08-29 ENCOUNTER — Encounter (HOSPITAL_COMMUNITY): Payer: Self-pay

## 2023-08-29 ENCOUNTER — Encounter: Payer: Medicare Other | Admitting: Internal Medicine

## 2023-08-29 ENCOUNTER — Other Ambulatory Visit: Payer: Self-pay

## 2023-08-29 DIAGNOSIS — E0821 Diabetes mellitus due to underlying condition with diabetic nephropathy: Secondary | ICD-10-CM

## 2023-08-29 DIAGNOSIS — E1151 Type 2 diabetes mellitus with diabetic peripheral angiopathy without gangrene: Secondary | ICD-10-CM | POA: Diagnosis not present

## 2023-08-29 DIAGNOSIS — R809 Proteinuria, unspecified: Secondary | ICD-10-CM

## 2023-08-29 DIAGNOSIS — I2510 Atherosclerotic heart disease of native coronary artery without angina pectoris: Secondary | ICD-10-CM | POA: Insufficient documentation

## 2023-08-29 DIAGNOSIS — E1122 Type 2 diabetes mellitus with diabetic chronic kidney disease: Secondary | ICD-10-CM | POA: Diagnosis present

## 2023-08-29 DIAGNOSIS — I129 Hypertensive chronic kidney disease with stage 1 through stage 4 chronic kidney disease, or unspecified chronic kidney disease: Secondary | ICD-10-CM | POA: Diagnosis not present

## 2023-08-29 DIAGNOSIS — Z79899 Other long term (current) drug therapy: Secondary | ICD-10-CM | POA: Diagnosis not present

## 2023-08-29 DIAGNOSIS — N182 Chronic kidney disease, stage 2 (mild): Secondary | ICD-10-CM | POA: Insufficient documentation

## 2023-08-29 DIAGNOSIS — Z7902 Long term (current) use of antithrombotics/antiplatelets: Secondary | ICD-10-CM | POA: Diagnosis not present

## 2023-08-29 LAB — PROTIME-INR
INR: 1 (ref 0.8–1.2)
Prothrombin Time: 13.3 s (ref 11.4–15.2)

## 2023-08-29 LAB — CBC
HCT: 36.3 % — ABNORMAL LOW (ref 39.0–52.0)
Hemoglobin: 11.5 g/dL — ABNORMAL LOW (ref 13.0–17.0)
MCH: 26.9 pg (ref 26.0–34.0)
MCHC: 31.7 g/dL (ref 30.0–36.0)
MCV: 84.8 fL (ref 80.0–100.0)
Platelets: 218 10*3/uL (ref 150–400)
RBC: 4.28 MIL/uL (ref 4.22–5.81)
RDW: 16.5 % — ABNORMAL HIGH (ref 11.5–15.5)
WBC: 7.1 10*3/uL (ref 4.0–10.5)
nRBC: 0 % (ref 0.0–0.2)

## 2023-08-29 LAB — GLUCOSE, CAPILLARY: Glucose-Capillary: 217 mg/dL — ABNORMAL HIGH (ref 70–99)

## 2023-08-29 MED ORDER — OXYCODONE HCL 5 MG PO TABS: 10.0000 mg | ORAL_TABLET | ORAL | Status: DC | PRN

## 2023-08-29 MED ORDER — HYDRALAZINE HCL 20 MG/ML IJ SOLN
INTRAMUSCULAR | Status: AC | PRN
  Administered 2023-08-29 (×2): 10 mg via INTRAVENOUS

## 2023-08-29 MED ORDER — LABETALOL HCL 5 MG/ML IV SOLN
20.0000 mg | Freq: Once | INTRAVENOUS | Status: AC
  Administered 2023-08-29: 20 mg via INTRAVENOUS
  Filled 2023-08-29: qty 4

## 2023-08-29 MED ORDER — FENTANYL CITRATE (PF) 100 MCG/2ML IJ SOLN
INTRAMUSCULAR | Status: AC
  Filled 2023-08-29: qty 2

## 2023-08-29 MED ORDER — FENTANYL CITRATE (PF) 100 MCG/2ML IJ SOLN
INTRAMUSCULAR | Status: AC | PRN
  Administered 2023-08-29 (×3): 25 ug via INTRAVENOUS

## 2023-08-29 MED ORDER — LABETALOL HCL 5 MG/ML IV SOLN: 50.0000 mg | Freq: Once | INTRAVENOUS | Status: DC

## 2023-08-29 MED ORDER — MIDAZOLAM HCL 2 MG/2ML IJ SOLN
INTRAMUSCULAR | Status: AC
  Filled 2023-08-29: qty 2

## 2023-08-29 MED ORDER — LABETALOL HCL 5 MG/ML IV SOLN
INTRAVENOUS | Status: AC | PRN
  Administered 2023-08-29: 10 mg via INTRAVENOUS

## 2023-08-29 MED ORDER — HYDRALAZINE HCL 20 MG/ML IJ SOLN
INTRAMUSCULAR | Status: AC
  Filled 2023-08-29: qty 1

## 2023-08-29 MED ORDER — LABETALOL HCL 5 MG/ML IV SOLN
INTRAVENOUS | Status: AC
  Filled 2023-08-29: qty 4

## 2023-08-29 MED ORDER — LIDOCAINE HCL (PF) 1 % IJ SOLN
10.0000 mL | Freq: Once | INTRAMUSCULAR | Status: AC
  Administered 2023-08-29: 10 mL via INTRADERMAL

## 2023-08-29 MED ORDER — MIDAZOLAM HCL 2 MG/2ML IJ SOLN
INTRAMUSCULAR | Status: AC | PRN
  Administered 2023-08-29: 1 mg via INTRAVENOUS
  Administered 2023-08-29: .5 mg via INTRAVENOUS

## 2023-08-29 NOTE — Procedures (Signed)
 Vascular and Interventional Radiology Procedure Note  Patient: Andres Richardson DOB: [birth year] Medical Record Number: [id number] Note Date/Time: 08/29/23 10:28 AM   Performing Physician: Thom Hall, MD Assistant(s): None  Diagnosis: Proteinuria  Procedure: RENAL BIOPSY, NON-TARGETED  Anesthesia: Conscious Sedation Complications: None Estimated Blood Loss: Minimal Specimens: Sent for Pathology  Findings:  Successful Ultrasound-guided biopsy of L kidney. A total of 3 samples were obtained. Hemostasis of the tract was achieved using Manual Pressure.  Plan: Bed rest for 2 hours.  See detailed procedure note with images in PACS. The patient tolerated the procedure well without incident or complication and was returned to Recovery in stable condition.    Thom Hall, MD [facility]   Pager. [phone] Clinic. [phone]

## 2023-08-29 NOTE — H&P (Signed)
 Chief Complaint: Patient was seen in consultation today for nephrotic range proteinuria  at the request of Singh,Vikas  Referring Physician(s): Singh,Vikas  Supervising Physician: Hughes Simmonds  Patient Status: [facility] - Out-pt  History of Present Illness: Andres Richardson is a 58 y.o. male with PMHs of HTN, CAD, DM, PAD, CKD 2 and nephrotic range proteinuria who presents for random renal bx.  Patient has been followed by [facility] for CKD and nephrotic proteinuria whih is most likely due to uncontrolled DM. During recent visit with nephrology a random renal bx for further work up was recommended which he decided to proceed.   Of note, patient has hx of CAD/PAD and s/p DES to right SFA in 05/2023 vascular surgery and transmetatarsal amputation of the right foot in 05/2023, patient is on DAPT with ASA 81 mg and Plavix  75 mg. Per nephrology note, cardiology was consulted to see if DAPT can be held for the renal bx, cardiology notes from 08/08/23 states that continue DAPT per vascular surgery. There is no note from vascular surgery in EMR since 07/07/23, which states patient should be on DAPT.   Patient laying in bed, not in acute distress. Friend at bedside.  Denise headache, fever, chills, shortness of breath, cough, chest pain, abdominal pain, nausea ,vomiting, and bleeding.  Informed the patient that his BP is high and will need to bring it down little bit to perform the biopsy safely. He states that he did not take his BP meds last night and he is nervous, his BP is usually good.    Past Medical History:  Diagnosis Date   CAD (coronary artery disease) 2022     Prox RCA lesion is 100% stenosed.   Prox Cx to Mid Cx lesion is 60% stenosed.   LPAV lesion is 60% stenosed.   Ost LAD to Prox LAD lesion is 40% stenosed.   Diabetes mellitus without complication (HCC) 1995   Type !! 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 Diabetic neuropathy.   Fournier gangrene 2022    Grade I diastolic dysfunction 12/02/2021   Grade I diastolic dysfunction 12/02/2021   History of creation of ostomy [facility])    Hyperlipidemia    Mixed   Hypertension    Essential   Obesity    Peripheral arterial disease (HCC)    occluded left common iliac artery   Proteinuria    Tobacco use disorder     Past Surgical History:  Procedure Laterality Date   BIOPSY  12/04/2021   Procedure: BIOPSY;  Surgeon: Saintclair Jasper, MD;  Location: [facility] ENDOSCOPY;  Service: Gastroenterology;;   ESOPHAGOGASTRODUODENOSCOPY (EGD) WITH PROPOFOL  N/A 12/04/2021   Procedure: ESOPHAGOGASTRODUODENOSCOPY (EGD) WITH PROPOFOL ;  Surgeon: Saintclair Jasper, MD;  Location: [facility] ENDOSCOPY;  Service: Gastroenterology;  Laterality: N/A;   INCISION AND DRAINAGE ABSCESS N/A 02/24/2021   Procedure: INCISION AND DRAINAGE PERINEUM;  Surgeon: Stevie, Herlene Righter, MD;  Location: [facility] ORS;  Service: General;  Laterality: N/A;   INCISION AND DRAINAGE ABSCESS N/A 02/25/2021   Procedure: REPEAT WASHOUT OF PERINEUM;  Surgeon: Stevie Herlene Righter, MD;  Location: [facility] ORS;  Service: General;  Laterality: N/A;   LAPAROSCOPIC DIVERTED COLOSTOMY N/A 02/25/2021   Procedure: LAPAROSCOPIC DIVERTING SIGMOID COLOSTOMY;  Surgeon: Stevie Herlene Righter, MD;  Location: [facility] ORS;  Service: General;  Laterality: N/A;   LEFT HEART CATH AND CORONARY ANGIOGRAPHY N/A 05/18/2021   Procedure: LEFT HEART CATH AND CORONARY ANGIOGRAPHY;  Surgeon: Court Dorn PARAS, MD;  Location: [facility] INVASIVE CV LAB;  Service: Cardiovascular;  Laterality: N/A;   LOWER EXTREMITY ANGIOGRAPHY Right 06/01/2023   Procedure: Lower Extremity Angiography;  Surgeon: Lanis Fonda BRAVO, MD;  Location: [facility] INVASIVE CV LAB;  Service: Cardiovascular;  Laterality: Right;   PERIPHERAL VASCULAR INTERVENTION Right 06/01/2023   Procedure: PERIPHERAL VASCULAR INTERVENTION;  Surgeon: Lanis Fonda BRAVO, MD;  Location: [facility] INVASIVE CV LAB;  Service: Cardiovascular;  Laterality: Right;  R SFA   TRANSMETATARSAL  AMPUTATION Right 06/02/2023   Procedure: TRANSMETATARSAL AMPUTATION;  Surgeon: Gershon Donnice SAUNDERS, DPM;  Location: [facility] OR;  Service: Orthopedics/Podiatry;  Laterality: Right;    Allergies: Adhesive [tape]  Medications: Prior to Admission medications   Medication Sig Start Date End Date Taking? Authorizing Provider  acetaminophen  (TYLENOL ) 500 MG tablet Take 2 tablets (1,000 mg total) by mouth every 6 (six) hours as needed for moderate pain (pain score 4-6). 06/06/23   Alexander-Savino, [place], MD  amLODipine  (NORVASC ) 5 MG tablet Take 1 tablet (5 mg total) by mouth daily. 03/16/23 03/15/24  Marylu Gee, DO  amoxicillin -clavulanate (AUGMENTIN ) 875-125 MG tablet Take 1 tablet by mouth 2 (two) times daily. 06/30/23   Gershon Donnice SAUNDERS, DPM  aspirin  EC 81 MG tablet Take 1 tablet (81 mg total) by mouth daily. Swallow whole. 02/16/23   [name], [name], DO  Blood Glucose Monitoring Suppl DEVI May substitute to any manufacturer covered by patient's insurance. 02/16/23   [name], [name], DO  calcium  carbonate (TUMS - DOSED IN MG ELEMENTAL CALCIUM ) 500 MG chewable tablet Chew 1 tablet by mouth at bedtime.    [provider]  clopidogrel  (PLAVIX ) 75 MG tablet Take 1 tablet (75 mg total) by mouth daily. 07/07/23   Lanis Fonda BRAVO, MD  Continuous Glucose Sensor (DEXCOM G7 SENSOR) MISC USE AS DIRECTED ONCE A WEEK 01/12/23   Lemon Raisin, MD  DULoxetine  (CYMBALTA ) 60 MG capsule Take 1 capsule (60 mg total) by mouth daily. 02/16/23 02/16/24  [name], [name], DO  ezetimibe  (ZETIA ) 10 MG tablet Take 1 tablet (10 mg total) by mouth daily. 07/26/23 08/25/23  [name], [name], DO  furosemide  (LASIX ) 40 MG tablet Take 1 tablet by mouth once daily 08/04/23   [name], Izetta, DO  gabapentin  (NEURONTIN ) 100 MG capsule Take 3 capsules (300 mg total) by mouth 3 (three) times daily. 07/07/23   Alexander-Savino, [place], MD  gentamicin  ointment (GARAMYCIN ) 0.1 % Apply 1 Application topically daily. 04/26/23   McDonald, Juliene SAUNDERS, DPM  Glucagon  (GVOKE HYPOPEN  1-PACK) 1 MG/0.2ML SOAJ Inject 1 mg into the skin as needed (low blood sugar with impaired consciousness). 03/28/23   Motwani, Komal, MD  Glucose Blood (BLOOD GLUCOSE TEST STRIPS) STRP 1 each by In Vitro route in the morning, at noon, and at bedtime. May substitute to any manufacturer covered by patient's insurance. 02/16/23   [name], Izetta, DO  Homeopathic Products (LEG CRAMPS) TABS Take 3 tablets by mouth daily as needed (leg cramps).    [provider]  HYDROcodone -acetaminophen  (NORCO/VICODIN) 5-325 MG tablet Take 1 tablet by mouth every 6 (six) hours as needed. 06/13/23   Gershon Donnice SAUNDERS, DPM  insulin  degludec (TRESIBA ) 100 UNIT/ML FlexTouch Pen Inject 65 Units into the skin at bedtime. 07/08/23   Alexander-Savino, Washington, MD  Insulin  Pen Needle (BD PEN NEEDLE NANO U/F) 32G X 4 MM MISC 1 Units by Does not apply route at bedtime. 07/08/23   Alexander-Savino, Washington, MD  Iron, Ferrous Sulfate , 325 (65 Fe) MG TABS Take 1 tablet by mouth daily. 06/02/21   [provider]  isosorbide  mononitrate (IMDUR ) 30 MG 24  hr tablet Take 1 tablet by mouth once daily 06/30/23   [name], Izetta, DO  Lancet Device MISC 1 each by Does not apply route in the morning, at noon, and at bedtime. May substitute to any manufacturer covered by patient's insurance. 02/16/23   [name], [name], DO  metFORMIN  (GLUCOPHAGE ) 1000 MG tablet Take 1 tablet (1,000 mg total) by mouth 2 (two) times daily with a meal. 09/21/22 05/26/24  Gawaluck, Greylon, MD  montelukast  (SINGULAIR ) 10 MG tablet Take 1 tablet (10 mg total) by mouth daily. 09/21/22 02/18/24  Gawaluck, Greylon, MD  Multiple Vitamin (MULTIVITAMIN WITH MINERALS) TABS tablet Take 1 tablet by mouth daily. 03/10/21   Gonfa, Taye T, MD  nitroGLYCERIN  (NITROSTAT ) 0.4 MG SL tablet Place 1 tablet (0.4 mg total) under the tongue every 5 (five) minutes as needed for chest pain. 02/17/23   [name], [name], DO  olmesartan  (BENICAR ) 20 MG tablet  Take 1 tablet (20 mg total) by mouth daily. 07/07/23   Alexander-Savino, Washington, MD  rosuvastatin  (CRESTOR ) 40 MG tablet Take 1 tablet (40 mg total) by mouth daily. 05/12/23   Loni Soyla LABOR, MD     Family History  Problem Relation Age of Onset   CAD Mother    Colon cancer Father     Social History   Socioeconomic History   Marital status: Single    Spouse name: Not on file   Number of children: Not on file   Years of education: Not on file   Highest education level: Not on file  Occupational History   Not on file  Tobacco Use   Smoking status: Every Day    Current packs/day: 0.25    Types: Cigarettes   Smokeless tobacco: Never   Tobacco comments:    5 cigs per day/Uses Nicorette patch  Vaping Use   Vaping status: Never Used  Substance and Sexual Activity   Alcohol use: Never   Drug use: Never   Sexual activity: Not on file  Other Topics Concern   Not on file  Social History Narrative   Not on file   Social Drivers of Health   Financial Resource Strain: Low Risk  (02/16/2023)   Overall Financial Resource Strain (CARDIA)    Difficulty of Paying Living Expenses: Not hard at all  Food Insecurity: No Food Insecurity (05/27/2023)   Hunger Vital Sign    Worried About Running Out of Food in the Last Year: Never true    Ran Out of Food in the Last Year: Never true  Transportation Needs: No Transportation Needs (05/27/2023)   PRAPARE - Administrator, Civil Service (Medical): No    Lack of Transportation (Non-Medical): No  Physical Activity: Insufficiently Active (02/16/2023)   Exercise Vital Sign    Days of Exercise per Week: 3 days    Minutes of Exercise per Session: 20 min  Stress: No Stress Concern Present (02/16/2023)   [employer] of Occupational Health - Occupational Stress Questionnaire    Feeling of Stress : Not at all  Social Connections: Moderately Isolated (02/16/2023)   Social Connection and Isolation Panel [NHANES]    Frequency of  Communication with Friends and Family: More than three times a week    Frequency of Social Gatherings with Friends and Family: Three times a week    Attends Religious Services: 1 to 4 times per year    Active Member of Clubs or Organizations: No    Attends Banker Meetings: Never    Marital Status:  Divorced     Review of Systems: A 12 point ROS discussed and pertinent positives are indicated in the HPI above.  All other systems are negative.  Vital Signs: BP (!) 172/79 Comment: 10mg  hydralazine  given at 0829  Pulse 86   Temp 98.5 F (36.9 C) (Oral)   Resp 14   Ht 5' 11 (1.803 m)   Wt 220 lb 7.4 oz (100 kg)   SpO2 96%   BMI 30.75 kg/m    Physical Exam Vitals and nursing note reviewed.  Constitutional:      General: Patient is not in acute distress.    Appearance: Normal appearance. Patient is not ill-appearing.  HENT:     Head: Normocephalic and atraumatic.     Mouth/Throat:     Mouth: Mucous membranes are moist.     Pharynx: Oropharynx is clear.  Cardiovascular:     Rate and Rhythm: Normal rate and regular rhythm.     Pulses: Normal pulses.     Heart sounds: Normal heart sounds.  Pulmonary:     Effort: Pulmonary effort is normal.     Breath sounds: Normal breath sounds.  Abdominal:     General: Abdomen is flat. Bowel sounds are normal.     Palpations: Abdomen is soft.  Musculoskeletal:     Cervical back: Neck supple.  Skin:    General: Skin is warm and dry.     Coloration: Skin is not jaundiced or pale.  Neurological:     Mental Status: Patient is alert and oriented to person, place, and time.  Psychiatric:        Mood and Affect: Mood normal.        Behavior: Behavior normal.        Judgment: Judgment normal.    MD Evaluation Airway: WNL Heart: WNL Abdomen: WNL Chest/ Lungs: WNL ASA  Classification: 3 Mallampati/Airway Score: One  Imaging: No results found.  Labs:  CBC: Recent Labs    06/05/23 1436 06/06/23 0856 06/22/23 1047  08/29/23 0649  WBC 6.4 5.4 6.7 7.1  HGB 7.1* 7.2* 9.3* 11.5*  HCT 22.4* 23.0* 30.3* 36.3*  PLT 238 257 256 218    COAGS: Recent Labs    08/29/23 0649  INR 1.0    BMP: Recent Labs    06/03/23 0639 06/04/23 0554 06/05/23 1259 06/06/23 0856 06/22/23 1047  NA 137 139 138 137 146*  K 3.8 3.5 4.7 3.9 4.4  CL 111 107 112* 113* 108*  CO2 19* 18* 16* 19* 24  GLUCOSE 163* 80 125* 154* 119*  BUN 25* 21* 21* 21* 15  CALCIUM  7.7* 8.9 8.1* 7.8* 7.8*  CREATININE 1.51* 1.29* 1.46* 1.38* 1.17  GFRNONAA 54* >60 56* 60*  --     LIVER FUNCTION TESTS: Recent Labs    10/05/22 1515 10/19/22 1617 01/19/23 1632 02/20/23 0058 03/23/23 1058 05/26/23 2025 05/30/23 0423 06/02/23 0355 06/03/23 0639 06/04/23 0554 06/06/23 0856  BILITOT <0.2  --  <0.2  --  0.3 0.6  --   --   --   --   --   AST 11  --  9  --  10 7*  --   --   --   --   --   ALT 8  --  8  --  8 7  --   --   --   --   --   ALKPHOS 86  --  94  --  84 81  --   --   --   --   --  PROT 6.1 6.0 6.1  --  6.9 6.3*  --   --   --   --   --   ALBUMIN  3.3*  --  3.4*   < > 3.3* 2.0*   < > 1.9* 1.8* 1.9* 1.9*   < > = values in this interval not displayed.    TUMOR MARKERS: No results for input(s): AFPTM, CEA, CA199, CHROMGRNA in the last 8760 hours.  Assessment and Plan: 58 y.o. male with DM, CKD, and nephrotic range proteinuria who presents for random renal bx.   NPO since MN VSS 179/72, 20 mg IV labetalol  ordered per Dr. Hughes.  CBC stable INR 1.0 On ASA 81 mg anfd Plavix  75 mg, has been held for 7 days   Risks and benefits of random renal bx  was discussed with the patient and/or patient's family including, but not limited to bleeding, infection, damage to adjacent structures or low yield requiring additional tests.  All of the questions were answered and there is agreement to proceed.  Consent signed and in chart.     Thank you for this interesting consult.  I greatly enjoyed meeting Andres Richardson and  look forward to participating in their care.  A copy of this report was sent to the requesting provider on this date.  Electronically Signed: Toya VEAR Cousin, PA-C 08/29/2023, 8:35 AM   I spent a total of   30 min in face to face in clinical consultation, greater than 50% of which was counseling/coordinating care for random renal bx.  This chart was dictated using voice recognition software.  Despite best efforts to proofread,  errors can occur which can change the documentation meaning.

## 2023-09-01 ENCOUNTER — Other Ambulatory Visit: Payer: Self-pay | Admitting: Internal Medicine

## 2023-09-01 ENCOUNTER — Ambulatory Visit (INDEPENDENT_AMBULATORY_CARE_PROVIDER_SITE_OTHER): Payer: Medicare Other | Admitting: Podiatry

## 2023-09-01 ENCOUNTER — Encounter: Payer: Self-pay | Admitting: Podiatry

## 2023-09-01 ENCOUNTER — Ambulatory Visit: Payer: Medicare Other

## 2023-09-01 DIAGNOSIS — Z89431 Acquired absence of right foot: Secondary | ICD-10-CM

## 2023-09-01 DIAGNOSIS — E11628 Type 2 diabetes mellitus with other skin complications: Secondary | ICD-10-CM

## 2023-09-01 DIAGNOSIS — I739 Peripheral vascular disease, unspecified: Secondary | ICD-10-CM

## 2023-09-01 DIAGNOSIS — L089 Local infection of the skin and subcutaneous tissue, unspecified: Secondary | ICD-10-CM

## 2023-09-01 DIAGNOSIS — M2142 Flat foot [pes planus] (acquired), left foot: Secondary | ICD-10-CM

## 2023-09-02 NOTE — Progress Notes (Unsigned)
Patient presents to the office today for diabetic shoe and insole measuring.   Patient was measured with brannock device to determine size and width for 1 pair of extra depth shoes, custom inserts 3 ea Left foot and Right Toefiller for TMA completed October 2024    Documentation of medical necessity will be sent to patient's treating diabetic doctor to verify and sign.    Patient's diabetic provider: Rudene Christians DO / Dr Ardelle Anton [facility]   Shoes and insoles will be ordered at that time and patient will be notified for an appointment for fitting when they arrive. Shoe size (per patient): 11 Brannock measurement: 11 Patient shoe selection- Shoe choice:   491 / 484 Shoe size ordered: 11WD ABN and financials signed

## 2023-09-02 NOTE — Progress Notes (Signed)
Patient presents to the office today for diabetic shoe and insole measuring.   Patient was measured with brannock device to determine size and width for 1 pair of extra depth shoes, custom inserts 3 ea Left foot and Right Toefiller for TMA completed October 2024    Documentation of medical necessity will be sent to patient's treating diabetic doctor to verify and sign.    Patient's diabetic provider: Rudene Christians DO / Dr Ardelle Anton [facility]   Shoes and insoles will be ordered at that time and patient will be notified for an appointment for fitting when they arrive. Shoe size (per patient): 11 Brannock measurement: 11 Patient shoe selection- Shoe choice:   491 / 484 Shoe size ordered: 11WD ABN and financials signed

## 2023-09-05 ENCOUNTER — Encounter (HOSPITAL_COMMUNITY): Payer: Self-pay

## 2023-09-05 LAB — SURGICAL PATHOLOGY

## 2023-09-06 ENCOUNTER — Encounter: Payer: Medicare Other | Admitting: Internal Medicine

## 2023-09-08 ENCOUNTER — Other Ambulatory Visit: Payer: Self-pay

## 2023-09-08 MED ORDER — CLOPIDOGREL BISULFATE 75 MG PO TABS: 75.0000 mg | ORAL_TABLET | Freq: Every day | ORAL | 6 refills | Status: DC

## 2023-09-12 ENCOUNTER — Ambulatory Visit (INDEPENDENT_AMBULATORY_CARE_PROVIDER_SITE_OTHER): Payer: Medicare Other | Admitting: Student

## 2023-09-12 ENCOUNTER — Encounter: Payer: Self-pay | Admitting: Student

## 2023-09-12 VITALS — BP 104/55 | HR 86 | Temp 97.9°F | Ht 71.0 in | Wt 220.6 lb

## 2023-09-12 DIAGNOSIS — E1165 Type 2 diabetes mellitus with hyperglycemia: Secondary | ICD-10-CM | POA: Diagnosis present

## 2023-09-12 DIAGNOSIS — I5032 Chronic diastolic (congestive) heart failure: Secondary | ICD-10-CM | POA: Diagnosis not present

## 2023-09-12 DIAGNOSIS — Z794 Long term (current) use of insulin: Secondary | ICD-10-CM

## 2023-09-12 LAB — POCT GLYCOSYLATED HEMOGLOBIN (HGB A1C): Hemoglobin A1C: 8.9 % — AB (ref 4.0–5.6)

## 2023-09-12 LAB — GLUCOSE, CAPILLARY: Glucose-Capillary: 290 mg/dL — ABNORMAL HIGH (ref 70–99)

## 2023-09-12 NOTE — Assessment & Plan Note (Addendum)
At last visit patient was having hypoglycemic episodes related to their prandial insulin usage.  They were previously on Tresiba 60 units daily and 5 units of Humalog 3 times daily with meals.  At that time [name] was switched to 65 units daily, and Humalog was discontinued.  They do also take 1000 mg twice daily metformin.  Last A1c 8.9, recheck today at 8.7.  They do endorse having some lows around the 70s in the mornings, but states that the blood sugar is otherwise relatively erratic and difficult to control.  In reviewing this patient's chart, he has previously been on various insulin regimens.  The patient has a history of Fournier's gangrene, hypoglycemia, and diabetic ketoacidosis.  This limits their ability to take some medications such as SGLT2s. They previously tried Ozempic, but this was discontinued due to anorexia.  He was on glipizide previously but this was also discontinued due to intolerance.  He does have a CGM, and he is established with the internal medicine clinics diabetic nutritionist.  He is also established Dr. Altamese Wolbach of [facility] endocrinology.  Endocrinology recommended Lantus nightly, Humalog before meals, correction scale insulin as well as metformin 1 g daily.  The patient stated that they were unable to tolerate this regiment, and have not returned since.  Plan: Continue with current regiment of 65 units Tresiba daily. Instructed patient to pay close attention to CGM in order to try and tease out which foods do and do not work for him. Recommend patient schedules follow-up with endocrinology in order to see what options they may have for him. May be worthwhile to revisit Ozempic or Glipizide

## 2023-09-12 NOTE — Patient Instructions (Signed)
Thank you, [name] for allowing [name] to provide your care today.  I have ordered the following tests for you:   Lab Orders         Glucose, capillary         POC Hbg A1C       Follow up: 2-3 months for Routine follow up, Diabetes.   Please remember: Follow with your Endocrine doctor.   [facility] [address] [place], [place] [address] Phone: [phone]   We look forward to seeing you next time. Please call our clinic at [phone] if you have any questions or concerns. The best time to call is Monday-Friday from 9am-4pm, but there is someone available 24/7. If after hours or the weekend, call the main hospital number and ask for the Internal Medicine Resident On-Call. If you need medication refills, please notify your pharmacy one week in advance and they will send [name] a request.   Thank you for trusting me with your care. Wishing you the best!  Lovie Macadamia MD [facility]

## 2023-09-12 NOTE — Assessment & Plan Note (Signed)
1+ edema of the feet bilaterally, no crackles, orthopnea, dyspnea.  No orthostatic dizziness, weakness, or lightheadedness.

## 2023-09-12 NOTE — Progress Notes (Unsigned)
Subjective:  CC: Diabetes follow-up  HPI:  [name] is a 58 y.o. person with a past medical history of type 2 diabetes, Fournier's gangrene, DKA, CAD, colostomy, hypertension, and hyperlipidemia who was last seen in our office on 07/27/2023, and is presenting for follow-up regarding her diabetes.  Please see problem based assessment and plan for additional details.  Past Medical History:  Diagnosis Date   CAD (coronary artery disease) 2022     Prox RCA lesion is 100% stenosed.   Prox Cx to Mid Cx lesion is 60% stenosed.   LPAV lesion is 60% stenosed.   Ost LAD to Prox LAD lesion is 40% stenosed.   Diabetes mellitus without complication (HCC) 1995   Type !! 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 Diabetic neuropathy.   Fournier gangrene 2022   Grade I diastolic dysfunction 12/02/2021   Grade I diastolic dysfunction 12/02/2021   History of creation of ostomy [facility])    Hyperlipidemia    Mixed   Hypertension    Essential   Obesity    Peripheral arterial disease (HCC)    occluded left common iliac artery   Proteinuria    Tobacco use disorder     Current Outpatient Medications on File Prior to Visit  Medication Sig Dispense Refill   acetaminophen (TYLENOL) 500 MG tablet Take 2 tablets (1,000 mg total) by mouth every 6 (six) hours as needed for moderate pain (pain score 4-6). 30 tablet 0   amLODipine (NORVASC) 5 MG tablet Take 1 tablet (5 mg total) by mouth daily. 30 tablet 11   amoxicillin-clavulanate (AUGMENTIN) 875-125 MG tablet Take 1 tablet by mouth 2 (two) times daily. 14 tablet 0   aspirin EC 81 MG tablet Take 1 tablet (81 mg total) by mouth daily. Swallow whole. 90 tablet 3   Blood Glucose Monitoring Suppl DEVI May substitute to any manufacturer covered by patient's insurance. 1 each 0   calcium carbonate (TUMS - DOSED IN MG ELEMENTAL CALCIUM) 500 MG chewable tablet Chew 1 tablet by mouth at bedtime.     clopidogrel (PLAVIX) 75 MG tablet Take 1 tablet (75 mg  total) by mouth daily. 30 tablet 6   Continuous Glucose Sensor (DEXCOM G7 SENSOR) MISC USE AS DIRECTED ONCE A WEEK 3 each 0   DULoxetine (CYMBALTA) 60 MG capsule Take 1 capsule (60 mg total) by mouth daily. 90 capsule 3   ezetimibe (ZETIA) 10 MG tablet Take 1 tablet (10 mg total) by mouth daily. 30 tablet 0   furosemide (LASIX) 40 MG tablet Take 1 tablet by mouth once daily 90 tablet 0   gabapentin (NEURONTIN) 100 MG capsule Take 3 capsules (300 mg total) by mouth 3 (three) times daily. 540 capsule 5   gentamicin ointment (GARAMYCIN) 0.1 % Apply 1 Application topically daily. 30 g 3   Glucagon (GVOKE HYPOPEN 1-PACK) 1 MG/0.2ML SOAJ Inject 1 mg into the skin as needed (low blood sugar with impaired consciousness). 0.4 mL 2   Glucose Blood (BLOOD GLUCOSE TEST STRIPS) STRP 1 each by In Vitro route in the morning, at noon, and at bedtime. May substitute to any manufacturer covered by patient's insurance. 300 strip 3   Homeopathic Products (LEG CRAMPS) TABS Take 3 tablets by mouth daily as needed (leg cramps).     HYDROcodone-acetaminophen (NORCO/VICODIN) 5-325 MG tablet Take 1 tablet by mouth every 6 (six) hours as needed. 15 tablet 0   insulin degludec (TRESIBA) 100 UNIT/ML FlexTouch Pen Inject 65 Units into the skin at  bedtime. 24 mL 11   Insulin Pen Needle (BD PEN NEEDLE NANO U/F) 32G X 4 MM MISC 1 Units by Does not apply route at bedtime. 90 each 3   Iron, Ferrous Sulfate, 325 (65 Fe) MG TABS Take 1 tablet by mouth daily.     isosorbide mononitrate (IMDUR) 30 MG 24 hr tablet Take 1 tablet by mouth once daily 90 tablet 3   Lancet Device MISC 1 each by Does not apply route in the morning, at noon, and at bedtime. May substitute to any manufacturer covered by patient's insurance. 1 each 0   metFORMIN (GLUCOPHAGE) 1000 MG tablet Take 1 tablet (1,000 mg total) by mouth 2 (two) times daily with a meal. 180 tablet 1   montelukast (SINGULAIR) 10 MG tablet Take 1 tablet (10 mg total) by mouth daily. 30  tablet 2   Multiple Vitamin (MULTIVITAMIN WITH MINERALS) TABS tablet Take 1 tablet by mouth daily. 90 tablet 1   nitroGLYCERIN (NITROSTAT) 0.4 MG SL tablet Place 1 tablet (0.4 mg total) under the tongue every 5 (five) minutes as needed for chest pain. 20 tablet 3   olmesartan (BENICAR) 20 MG tablet Take 1 tablet (20 mg total) by mouth daily. 90 tablet 3   rosuvastatin (CRESTOR) 40 MG tablet Take 1 tablet (40 mg total) by mouth daily. 90 tablet 3   No current facility-administered medications on file prior to visit.    Review of Systems: Please see assessment and plan for pertinent positives and negatives.  Objective:   Vitals:   09/12/23 1440  BP: (!) 104/55  Pulse: 86  Temp: 97.9 F (36.6 C)  TempSrc: Oral  SpO2: 100%  Weight: 220 lb 9.6 oz (100.1 kg)  Height: 5\' 11"  (1.803 m)    Physical Exam: Constitutional: Well-appearing Cardiovascular: Regular rate and rhythm Pulmonary/Chest: lungs clear to auscultation bilaterally Abdominal: 1+ edema of the feet bilaterally.  Extremities: No edema of the lower extremities bilaterally Psych: Pleasant affect Thought process is linear and is goal-directed.     Assessment & Plan:  Uncontrolled type 2 diabetes mellitus with hyperglycemia, with long-term current use of insulin (HCC) At last visit patient was having hypoglycemic episodes related to their prandial insulin usage.  They were previously on Tresiba 60 units daily and 5 units of Humalog 3 times daily with meals.  At that time Andres Richardson was switched to 65 units daily, and Humalog was discontinued.  They do also take 1000 mg twice daily metformin.  Last A1c 8.9, recheck today at 8.7.  They do endorse having some lows around the 70s in the mornings, but states that the blood sugar is otherwise relatively erratic and difficult to control.  In reviewing this patient's chart, he has previously been on various insulin regimens.  The patient has a history of Fournier's gangrene, hypoglycemia,  and diabetic ketoacidosis.  This limits their ability to take some medications such as SGLT2s. They previously tried Ozempic, but this was discontinued due to anorexia.  He was on glipizide previously but this was also discontinued due to intolerance.  He does have a CGM, and he is established with the internal medicine clinics diabetic nutritionist.  He is also established Dr. Altamese [place] [facility] endocrinology.  Endocrinology recommended Lantus nightly, Humalog before meals, correction scale insulin as well as metformin 1 g daily.  The patient stated that they were unable to tolerate this regiment, and have not returned since.  Plan: Continue with current regiment of 65 units Tresiba daily. Instructed patient  to pay close attention to CGM in order to try and tease out which foods do and do not work for him. Recommend patient schedules follow-up with endocrinology in order to see what options they may have for him. May be worthwhile to revisit Ozempic or Glipizide    (HFpEF) heart failure with preserved ejection fraction (HCC) 1+ edema of the feet bilaterally, no crackles, orthopnea, dyspnea.  No orthostatic dizziness, weakness, or lightheadedness.    Patient discussed with Dr. Willow Ora MD [facility]  PGY-1 Pager: [phone]  Phone: [phone] Date 09/12/2023  Time 11:01 PM

## 2023-09-15 ENCOUNTER — Telehealth: Payer: Self-pay

## 2023-09-15 NOTE — Telephone Encounter (Signed)
called patient and LM that we would like to get him scheduled for an appointment with Dr. Tomasa Rand for reassessment and to discuss ECL at the [facility]

## 2023-09-17 NOTE — Progress Notes (Signed)
Internal Medicine Clinic Attending  Case discussed with the resident at the time of the visit.  We reviewed the resident's history and exam and pertinent patient test results.  I agree with the assessment, diagnosis, and plan of care documented in the resident's note. Given patient has established care with an endocrinologist, best practice would be to allow further DM management to be directed by that professional to avoid dupliation and confusion.

## 2023-09-22 ENCOUNTER — Other Ambulatory Visit: Payer: Self-pay | Admitting: Internal Medicine

## 2023-09-26 ENCOUNTER — Ambulatory Visit (HOSPITAL_COMMUNITY)
Admission: RE | Admit: 2023-09-26 | Discharge: 2023-09-26 | Disposition: A | Discharge status: Home / Self Care | Payer: 59 | Source: Ambulatory Visit | Attending: Cardiology | Admitting: Cardiology

## 2023-09-26 DIAGNOSIS — R0989 Other specified symptoms and signs involving the circulatory and respiratory systems: Secondary | ICD-10-CM | POA: Insufficient documentation

## 2023-10-04 ENCOUNTER — Telehealth: Payer: Self-pay | Admitting: Student

## 2023-10-04 ENCOUNTER — Other Ambulatory Visit: Payer: Self-pay

## 2023-10-04 DIAGNOSIS — E1165 Type 2 diabetes mellitus with hyperglycemia: Secondary | ICD-10-CM

## 2023-10-04 MED ORDER — BD PEN NEEDLE NANO U/F 32G X 4 MM MISC: 1.0000 [IU] | Freq: Every day | 3 refills | Status: AC

## 2023-10-04 NOTE — Telephone Encounter (Signed)
Patient returned call for his test results.  

## 2023-10-04 NOTE — Telephone Encounter (Signed)
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 He is already on good medications for this. This is just something we will continue to monitor in the future.   Dopplers also incidentally showed a mass on the inferior pole of the right thyroid. Per chart review, he has known thyroid nodules which are being monitored. It does look like this nodule may be a bigger than prior imaging in 01/2023.  He is scheduled to have next thyroid ultrasound in 01/2024 but would recommend he discuss this with the provider (Dr. Aundria Rud) that is following his thyroid nodules.   Thank you!    Patient identification verified by 2 forms. Marilynn Rail, RN    Called and spoke to patient  Relayed result message  Patient verbalized understanding, no questions at this time

## 2023-10-06 ENCOUNTER — Ambulatory Visit (HOSPITAL_COMMUNITY): Payer: 59

## 2023-10-06 ENCOUNTER — Ambulatory Visit: Payer: 59

## 2023-11-09 ENCOUNTER — Ambulatory Visit: Payer: Medicare Other | Admitting: Internal Medicine

## 2023-11-09 VITALS — BP 121/56 | HR 88 | Temp 98.0°F | Ht 71.0 in | Wt 211.9 lb

## 2023-11-09 DIAGNOSIS — D509 Iron deficiency anemia, unspecified: Secondary | ICD-10-CM

## 2023-11-09 DIAGNOSIS — S91301A Unspecified open wound, right foot, initial encounter: Secondary | ICD-10-CM

## 2023-11-09 DIAGNOSIS — E1165 Type 2 diabetes mellitus with hyperglycemia: Secondary | ICD-10-CM

## 2023-11-09 DIAGNOSIS — R400 Somnolence: Secondary | ICD-10-CM | POA: Diagnosis not present

## 2023-11-09 DIAGNOSIS — I25110 Atherosclerotic heart disease of native coronary artery with unstable angina pectoris: Secondary | ICD-10-CM

## 2023-11-09 DIAGNOSIS — Z794 Long term (current) use of insulin: Secondary | ICD-10-CM

## 2023-11-09 DIAGNOSIS — R5383 Other fatigue: Secondary | ICD-10-CM

## 2023-11-09 NOTE — Patient Instructions (Addendum)
 Thank you, Mr.Ella V Quigley for allowing [name] to provide your care today.   Diabetes Please bring in dexcom and ALL medications at follow-up.  Please wear your CPAP!!!! I think this is why you are so sleepy. I am also checking your blood count and thyroid labs today.  Follow-up in 1 month to repeat diabetes blood work  I have ordered the following labs for you:  Lab Orders         TSH         CBC no Diff         BMP8+Anion Gap     Referrals ordered today:   Referral Orders         Ambulatory referral to Gastroenterology       I have ordered the following medication/changed the following medications:   Stop the following medications: Medications Discontinued During This Encounter  Medication Reason   amoxicillin-clavulanate (AUGMENTIN) 875-125 MG tablet      Start the following medications: No orders of the defined types were placed in this encounter.    Follow up:  1 month  We look forward to seeing you next time. Please call our clinic at [phone] if you have any questions or concerns. The best time to call is Monday-Friday from 9am-4pm, but there is someone available 24/7. If after hours or the weekend, call the main hospital number and ask for the Internal Medicine Resident On-Call. If you need medication refills, please notify your pharmacy one week in advance and they will send [name] a request.   Thank you for trusting me with your care. Wishing you the best!   Rudene Christians, DO [facility]

## 2023-11-09 NOTE — Progress Notes (Unsigned)
 Subjective:  CC: diabetes follow-up  HPI:  [name] is a 58 y.o. male with a past medical history of diabetes with history of fournier's gangreene and DKA, colostomy, HLD, HFpEF, who presents today for follow-up on blood pressure.   He was last seen   Please see problem based assessment and plan for additional details.  Past Medical History:  Diagnosis Date   CAD (coronary artery disease) 2022     Prox RCA lesion is 100% stenosed.   Prox Cx to Mid Cx lesion is 60% stenosed.   LPAV lesion is 60% stenosed.   Ost LAD to Prox LAD lesion is 40% stenosed.   Diabetes mellitus without complication (HCC) 1995   Type !! 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 Diabetic neuropathy.   Fournier gangrene 2022   Grade I diastolic dysfunction 12/02/2021   Grade I diastolic dysfunction 12/02/2021   History of creation of ostomy [facility])    Hyperlipidemia    Mixed   Hypertension    Essential   Obesity    Peripheral arterial disease (HCC)    occluded left common iliac artery   Proteinuria    Tobacco use disorder     MEDICATIONS: Medication refill history is concerning for nonadherence.  Patient encouraged to bring in all prescription medications at follow-up visit in 1 month. Dexcom Iron tablets Metformin at 1000 mg twice daily Zetia 10 mg daily Montelukast 10 mg daily Olmesartan 20 mg daily Imdur 30 mg daily Gabapentin 300 mg 3 times daily Lasix 40 mg daily Crestor 40 mg daily Amlodipine 5 mg daily Duloxetine 60 mg daily Tresiba 65 units nightly Plavix 75 mg daily Aspirin 81 mg daily  Family History  Problem Relation Age of Onset   CAD Mother    Colon cancer Father     Past Surgical History:  Procedure Laterality Date   BIOPSY  12/04/2021   Procedure: BIOPSY;  Surgeon: Kerin Salen, MD;  Location: [facility];  Service: Gastroenterology;;   ESOPHAGOGASTRODUODENOSCOPY (EGD) WITH PROPOFOL N/A 12/04/2021   Procedure: ESOPHAGOGASTRODUODENOSCOPY (EGD) WITH  PROPOFOL;  Surgeon: Kerin Salen, MD;  Location: [facility]OS[facility];  Service: Gastroenterology;  Laterality: N/A;   INCISION AND DRAINAGE ABSCESS N/A 02/24/2021   Procedure: INCISION AND DRAINAGE PERINEUM;  Surgeon: Sheliah Hatch, De Blanch, MD;  Location: [facility] ORS;  Service: General;  Laterality: N/A;   INCISION AND DRAINAGE ABSCESS N/A 02/25/2021   Procedure: REPEAT WASHOUT OF PERINEUM;  Surgeon: Sheliah Hatch De Blanch, MD;  Location: [facility] ORS;  Service: General;  Laterality: N/A;   LAPAROSCOPIC DIVERTED COLOSTOMY N/A 02/25/2021   Procedure: LAPAROSCOPIC DIVERTING SIGMOID COLOSTOMY;  Surgeon: [name], MD;  Location: [facility] ORS;  Service: General;  Laterality: N/A;   LEFT HEART CATH AND CORONARY ANGIOGRAPHY N/A 05/18/2021   Procedure: LEFT HEART CATH AND CORONARY ANGIOGRAPHY;  Surgeon: Runell Gess, MD;  Location: [facility] INVASIVE CV LAB;  Service: Cardiovascular;  Laterality: N/A;   LOWER EXTREMITY ANGIOGRAPHY Right 06/01/2023   Procedure: Lower Extremity Angiography;  Surgeon: [name], MD;  Location: [facility] INVASIVE CV LAB;  Service: Cardiovascular;  Laterality: Right;   PERIPHERAL VASCULAR INTERVENTION Right 06/01/2023   Procedure: PERIPHERAL VASCULAR INTERVENTION;  Surgeon: [name], MD;  Location: [facility] INVASIVE CV LAB;  Service: Cardiovascular;  Laterality: Right;  R SFA   TRANSMETATARSAL AMPUTATION Right 06/02/2023   Procedure: TRANSMETATARSAL AMPUTATION;  Surgeon: [name], DPM;  Location: [facility] OR;  Service: Orthopedics/Podiatry;  Laterality: Right;     Social History   Socioeconomic  History   Marital status: Single    Spouse name: Not on file   Number of children: Not on file   Years of education: Not on file   Highest education level: Not on file  Occupational History   Not on file  Tobacco Use   Smoking status: Every Day    Current packs/day: 0.25    Types: Cigarettes   Smokeless tobacco: Never   Tobacco comments:    5 cigs per day/Uses Nicorette patch  Vaping  Use   Vaping status: Never Used  Substance and Sexual Activity   Alcohol use: Never   Drug use: Never   Sexual activity: Not on file  Other Topics Concern   Not on file  Social History Narrative   Not on file   Social Drivers of Health   Financial Resource Strain: Low Risk  (09/12/2023)   Overall Financial Resource Strain (CARDIA)    Difficulty of Paying Living Expenses: Not hard at all  Food Insecurity: No Food Insecurity (09/12/2023)   Hunger Vital Sign    Worried About Running Out of Food in the Last Year: Never true    Ran Out of Food in the Last Year: Never true  Transportation Needs: No Transportation Needs (09/12/2023)   PRAPARE - Administrator, Civil Service (Medical): No    Lack of Transportation (Non-Medical): No  Physical Activity: Insufficiently Active (02/16/2023)   Exercise Vital Sign    Days of Exercise per Week: 3 days    Minutes of Exercise per Session: 20 min  Stress: No Stress Concern Present (09/12/2023)   [employer] of Occupational Health - Occupational Stress Questionnaire    Feeling of Stress : Not at all  Social Connections: Moderately Isolated (09/12/2023)   Social Connection and Isolation Panel [NHANES]    Frequency of Communication with Friends and Family: More than three times a week    Frequency of Social Gatherings with Friends and Family: Three times a week    Attends Religious Services: More than 4 times per year    Active Member of Clubs or Organizations: No    Attends Banker Meetings: Never    Marital Status: Divorced  Catering manager Violence: Not At Risk (09/12/2023)   Humiliation, Afraid, Rape, and Kick questionnaire    Fear of Current or Ex-Partner: No    Emotionally Abused: No    Physically Abused: No    Sexually Abused: No    Review of Systems: ROS negative except for what is noted on the assessment and plan.  Objective:   Vitals:   11/09/23 1416  BP: (!) 121/56  Pulse: 88  Temp: 98 F (36.7 C)   TempSrc: Oral  SpO2: 100%  Weight: 211 lb 14.4 oz (96.1 kg)  Height: 5\' 11"  (1.803 m)    Physical Exam: Constitutional: well-appearing, in no acute distress Cardiovascular: regular rate and rhythm, no m/r/g Pulmonary/Chest: normal work of breathing on room air, lungs clear to auscultation bilaterally Abdominal: soft, ostomy in place to left lower quadrant, stoma is well-perfused MSK: Right foot TMA is well-healed, boot remains in place, left lower extremity without wounds Neurological: alert & oriented x 3, normal gait Skin: warm and dry   Assessment & Plan:  Uncontrolled type 2 diabetes mellitus with hyperglycemia, with long-term current use of insulin (HCC) Patient has previously followed with endocrinology for diabetes.  He saw them last fall and states that he would not want to go back to their practice.  Last  hemoglobin A1c was in January at 8.9%.  He has a Dexcom in place but is not linked to our clinic.  He did not bring his receiver in today or any of his medications. He had a colostomy placed several years ago due to Fournier's gangrene secondary to SGLT2 administration.  He has been trying to get his A1c less than 8 for several months in order to qualify for possible reversal of ostomy. Home medications include metformin at 1000 mg twice daily and Tresiba 65 units.  He denies having recent hypoglycemic episodes. P: Follow-up in 1 month for repeat hemoglobin A1c.  It sounds like patient does not want to go to endocrinology and [facility] should take care of diabetes.  I do have high concerns for adherence issues and emphasized to patient that he needs to bring in [facility] receiver at follow-up with all of his medications.  Daytime sleepiness He has been noting some daytime sleepiness for the last several weeks.  He does not wear his CPAP as he feels like it dries him out and he is concerned that his esophageal ulcers will get worse from this.  He reports having repeat endoscopy scheduled  but his blood sugars were out of control that day and procedure was canceled. -Referral placed to GI for repeat endoscopy.  Patient needs to be using CPAP and he is reporting he is hesitant with esophageal ulcers.  Iron deficiency anemia History of esophageal ulcers on EGD April 2023.  He needs to follow-up for repeat endoscopy.  He is reporting some fatigue at visit today.  He denies dark output from ostomy and has not noticed any bright red blood.  He denies dizziness or shortness of breath.  I ask him to stop by lab to get CBC.  unfortunately he did not do so. P: Would recheck CBC at follow-up visit in 1 month  Wound of right foot He is overdue for follow-up with podiatry.  I encouraged him to call office.  His right TMA is well-healed.  His feet remain high risk for wounds.   Patient discussed with Dr. Precious Bard Gorje Iyer, D.O. [facility]  PGY-3 Pager: [phone]  Phone: [phone] Date 11/10/2023  Time 12:25 PM

## 2023-11-10 DIAGNOSIS — R400 Somnolence: Secondary | ICD-10-CM | POA: Insufficient documentation

## 2023-11-10 NOTE — Assessment & Plan Note (Signed)
 History of esophageal ulcers on EGD April 2023.  He needs to follow-up for repeat endoscopy.  He is reporting some fatigue at visit today.  He denies dark output from ostomy and has not noticed any bright red blood.  He denies dizziness or shortness of breath.  I ask him to stop by lab to get CBC.  unfortunately he did not do so. P: Would recheck CBC at follow-up visit in 1 month

## 2023-11-10 NOTE — Assessment & Plan Note (Signed)
 Patient has previously followed with endocrinology for diabetes.  He saw them last fall and states that he would not want to go back to their practice.  Last hemoglobin A1c was in January at 8.9%.  He has a Dexcom in place but is not linked to our clinic.  He did not bring his receiver in today or any of his medications. He had a colostomy placed several years ago due to Fournier's gangrene secondary to SGLT2 administration.  He has been trying to get his A1c less than 8 for several months in order to qualify for possible reversal of ostomy. Home medications include metformin at 1000 mg twice daily and Tresiba 65 units.  He denies having recent hypoglycemic episodes. P: Follow-up in 1 month for repeat hemoglobin A1c.  It sounds like patient does not want to go to endocrinology and [facility] should take care of diabetes.  I do have high concerns for adherence issues and emphasized to patient that he needs to bring in [facility] receiver at follow-up with all of his medications.

## 2023-11-10 NOTE — Assessment & Plan Note (Deleted)
 Heart cath in 2022 showed Prox RCA lesion is 100% stenosed, Prox Cx to Mid Cx lesion is 60% stenosed,  LPAV lesion is 60% stenosed, and Ost LAD to Prox LAD lesion is 40% stenosed. P: Medication management of CAD ASA 81 mg Imdur 30 qd Atorvastatin 40 mg -repeat lipid panel at follow-up

## 2023-11-10 NOTE — Assessment & Plan Note (Signed)
 He is overdue for follow-up with podiatry.  I encouraged him to call office.  His right TMA is well-healed.  His feet remain high risk for wounds.

## 2023-11-10 NOTE — Assessment & Plan Note (Signed)
 He has been noting some daytime sleepiness for the last several weeks.  He does not wear his CPAP as he feels like it dries him out and he is concerned that his esophageal ulcers will get worse from this.  He reports having repeat endoscopy scheduled but his blood sugars were out of control that day and procedure was canceled. -Referral placed to GI for repeat endoscopy.  Patient needs to be using CPAP and he is reporting he is hesitant with esophageal ulcers.

## 2023-11-12 ENCOUNTER — Encounter (HOSPITAL_BASED_OUTPATIENT_CLINIC_OR_DEPARTMENT_OTHER): Payer: Self-pay | Admitting: Emergency Medicine

## 2023-11-12 ENCOUNTER — Emergency Department (HOSPITAL_BASED_OUTPATIENT_CLINIC_OR_DEPARTMENT_OTHER)
Admission: EM | Admit: 2023-11-12 | Discharge: 2023-11-12 | Disposition: A | Discharge status: Home / Self Care | Attending: Emergency Medicine | Admitting: Emergency Medicine

## 2023-11-12 ENCOUNTER — Other Ambulatory Visit: Payer: Self-pay

## 2023-11-12 ENCOUNTER — Emergency Department (HOSPITAL_BASED_OUTPATIENT_CLINIC_OR_DEPARTMENT_OTHER)

## 2023-11-12 DIAGNOSIS — Z7982 Long term (current) use of aspirin: Secondary | ICD-10-CM | POA: Insufficient documentation

## 2023-11-12 DIAGNOSIS — Z7901 Long term (current) use of anticoagulants: Secondary | ICD-10-CM | POA: Insufficient documentation

## 2023-11-12 DIAGNOSIS — K611 Rectal abscess: Secondary | ICD-10-CM | POA: Insufficient documentation

## 2023-11-12 DIAGNOSIS — K5900 Constipation, unspecified: Secondary | ICD-10-CM | POA: Diagnosis present

## 2023-11-12 HISTORY — DX: Disorder of thyroid, unspecified: E07.9

## 2023-11-12 HISTORY — DX: Disorder of kidney and ureter, unspecified: N28.9

## 2023-11-12 LAB — CBC WITH DIFFERENTIAL/PLATELET
Abs Immature Granulocytes: 0.03 10*3/uL (ref 0.00–0.07)
Basophils Absolute: 0.1 10*3/uL (ref 0.0–0.1)
Basophils Relative: 1 %
Eosinophils Absolute: 0.2 10*3/uL (ref 0.0–0.5)
Eosinophils Relative: 2 %
HCT: 32.3 % — ABNORMAL LOW (ref 39.0–52.0)
Hemoglobin: 10.4 g/dL — ABNORMAL LOW (ref 13.0–17.0)
Immature Granulocytes: 0 %
Lymphocytes Relative: 11 %
Lymphs Abs: 1 10*3/uL (ref 0.7–4.0)
MCH: 28.1 pg (ref 26.0–34.0)
MCHC: 32.2 g/dL (ref 30.0–36.0)
MCV: 87.3 fL (ref 80.0–100.0)
Monocytes Absolute: 0.6 10*3/uL (ref 0.1–1.0)
Monocytes Relative: 6 %
Neutro Abs: 7.4 10*3/uL (ref 1.7–7.7)
Neutrophils Relative %: 80 %
Platelets: 227 10*3/uL (ref 150–400)
RBC: 3.7 MIL/uL — ABNORMAL LOW (ref 4.22–5.81)
RDW: 15.7 % — ABNORMAL HIGH (ref 11.5–15.5)
WBC: 9.2 10*3/uL (ref 4.0–10.5)
nRBC: 0 % (ref 0.0–0.2)

## 2023-11-12 LAB — BASIC METABOLIC PANEL WITH GFR
Anion gap: 9 (ref 5–15)
BUN: 24 mg/dL — ABNORMAL HIGH (ref 6–20)
CO2: 23 mmol/L (ref 22–32)
Calcium: 8.2 mg/dL — ABNORMAL LOW (ref 8.9–10.3)
Chloride: 104 mmol/L (ref 98–111)
Creatinine, Ser: 1.74 mg/dL — ABNORMAL HIGH (ref 0.61–1.24)
GFR, Estimated: 45 mL/min — ABNORMAL LOW (ref >60)
Glucose, Bld: 359 mg/dL — ABNORMAL HIGH (ref 70–99)
Potassium: 4.4 mmol/L (ref 3.5–5.1)
Sodium: 136 mmol/L (ref 135–145)

## 2023-11-12 MED ORDER — PIPERACILLIN-TAZOBACTAM 3.375 G IVPB 30 MIN
3.3750 g | Freq: Once | INTRAVENOUS | Status: AC
  Administered 2023-11-12: 3.375 g via INTRAVENOUS
  Filled 2023-11-12: qty 50

## 2023-11-12 MED ORDER — IOHEXOL 300 MG/ML  SOLN
100.0000 mL | Freq: Once | INTRAMUSCULAR | Status: AC | PRN
  Administered 2023-11-12: 100 mL via INTRAVENOUS

## 2023-11-12 MED ORDER — SODIUM CHLORIDE 0.9 % IV SOLN: INTRAVENOUS | Status: DC

## 2023-11-12 MED ORDER — SODIUM CHLORIDE 0.9 % IV BOLUS
1000.0000 mL | Freq: Once | INTRAVENOUS | Status: AC
  Administered 2023-11-12: 1000 mL via INTRAVENOUS

## 2023-11-12 MED ORDER — ONDANSETRON HCL 4 MG/2ML IJ SOLN
4.0000 mg | Freq: Once | INTRAMUSCULAR | Status: AC
  Administered 2023-11-12: 4 mg via INTRAVENOUS
  Filled 2023-11-12: qty 2

## 2023-11-12 MED ORDER — AMOXICILLIN-POT CLAVULANATE 875-125 MG PO TABS: 1.0000 | ORAL_TABLET | Freq: Two times a day (BID) | ORAL | 0 refills | Status: AC

## 2023-11-12 MED ORDER — DOCUSATE SODIUM 100 MG PO CAPS: 100.0000 mg | ORAL_CAPSULE | Freq: Two times a day (BID) | ORAL | 0 refills | Status: DC

## 2023-11-12 MED ORDER — AMOXICILLIN-POT CLAVULANATE 875-125 MG PO TABS: 1.0000 | ORAL_TABLET | Freq: Two times a day (BID) | ORAL | 0 refills | Status: DC

## 2023-11-12 NOTE — Discharge Instructions (Addendum)
 While you were in the emergency room, you had a CT scan done that showed a pocket of infection in your rectum.  We spoke with the surgeon who recommended oral antibiotics for this.  You can take Augmentin once in the morning and once in the evening for the next 14 days.  Is very important that you return to the emergency room if you start to develop increasing pain in that area or fever.  Follow-up with your primary care doctor within 1 week.

## 2023-11-12 NOTE — ED Notes (Signed)
 RN reviewed discharge instructions with pt. Pt verbalized understanding and had no further questions. VSS upon discharge.

## 2023-11-12 NOTE — ED Triage Notes (Signed)
 Pt states since 3/26 he has been constipated (has a colostomy bag) and everytime he has voided he feels pressure in his rectum (like he needs to poop). Today he had some discharge ,mucus and blood from rectum.

## 2023-11-12 NOTE — ED Provider Notes (Signed)
  Physical Exam  BP (!) 161/65   Pulse 78   Temp 98.2 F (36.8 C)   Resp 16   SpO2 100%   Physical Exam  Procedures  Procedures  ED Course / MDM    Medical Decision Making Amount and/or Complexity of Data Reviewed Labs: ordered. Radiology: ordered.  Risk Prescription drug management.   Andres Richardson, have assumed care of this patient.  In brief, 58 year old male history of colostomy due to Fournier's gangrene and subsequent surgeries.  He is here today due to decreased ostomy output, as well as some rectal discharge.  Patient was signed out pending CT imaging.  Patient CT imaging shows a perirectal abscess.  I discussed the patient CT findings with Dr. Derrell Lolling who reviewed the patient's imaging.  He recommended oral antibiotics as the abscess had already had some drainage and PCP follow-up.  Did give the patient a dose of Zosyn here in the emergency room.  Will discharge on Augmentin.  Return precautions discussed extensively with patient at bedside.     Andres Simmonds T, DO 11/12/23 1743

## 2023-11-12 NOTE — ED Notes (Signed)
 Patient returned from CT

## 2023-11-12 NOTE — ED Provider Notes (Signed)
 [place] EMERGENCY DEPARTMENT AT [facility] Provider Note   CSN: [id number] Arrival date & time: 11/12/23  1149     History {Add pertinent medical, surgical, social history, OB history to HPI:1} No chief complaint on file.   Andres Richardson is a 58 y.o. male.  58 year old male presents with abdominal discomfort and constipation times several days.  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  States he noted some mucus discharge from his rectum today.  Denies any fever or chills.  Has had decreased output in his colostomy bag.  No emesis.      Home Medications Prior to Admission medications   Medication Sig Start Date End Date Taking? Authorizing Provider  acetaminophen (TYLENOL) 500 MG tablet Take 2 tablets (1,000 mg total) by mouth every 6 (six) hours as needed for moderate pain (pain score 4-6). 06/06/23   Alexander-Savino, Washington, MD  amLODipine (NORVASC) 5 MG tablet Take 1 tablet (5 mg total) by mouth daily. 03/16/23 03/15/24  Carmina Miller, DO  aspirin EC 81 MG tablet Take 1 tablet (81 mg total) by mouth daily. Swallow whole. 02/16/23   [name], [name], DO  Blood Glucose Monitoring Suppl DEVI May substitute to any manufacturer covered by patient's insurance. 02/16/23   [name], [name], DO  calcium carbonate (TUMS - DOSED IN MG ELEMENTAL CALCIUM) 500 MG chewable tablet Chew 1 tablet by mouth at bedtime.    [provider]  clopidogrel (PLAVIX) 75 MG tablet Take 1 tablet (75 mg total) by mouth daily. 09/08/23   [name], MD  Continuous Glucose Sensor (DEXCOM G7 SENSOR) MISC USE AS DIRECTED ONCE A WEEK 01/12/23   Quincy Simmonds, MD  DULoxetine (CYMBALTA) 60 MG capsule Take 1 capsule (60 mg total) by mouth daily. 02/16/23 02/16/24  [name], [name], DO  ezetimibe (ZETIA) 10 MG tablet Take 1 tablet (10 mg total) by mouth daily. 07/26/23 08/25/23  [name], Florentina Addison, DO  furosemide (LASIX) 40 MG tablet Take 1 tablet by mouth once daily  08/04/23   [name], Florentina Addison, DO  gabapentin (NEURONTIN) 100 MG capsule Take 3 capsules (300 mg total) by mouth 3 (three) times daily. 07/07/23   Alexander-Savino, Washington, MD  gentamicin ointment (GARAMYCIN) 0.1 % Apply 1 Application topically daily. 04/26/23   McDonald, Rachelle Hora, DPM  Glucagon (GVOKE HYPOPEN 1-PACK) 1 MG/0.2ML SOAJ Inject 1 mg into the skin as needed (low blood sugar with impaired consciousness). 03/28/23   Motwani, Carin Hock, MD  Glucose Blood (BLOOD GLUCOSE TEST STRIPS) STRP 1 each by In Vitro route in the morning, at noon, and at bedtime. May substitute to any manufacturer covered by patient's insurance. 02/16/23   [name], Florentina Addison, DO  Homeopathic Products (LEG CRAMPS) TABS Take 3 tablets by mouth daily as needed (leg cramps).    [provider]  insulin degludec (TRESIBA) 100 UNIT/ML FlexTouch Pen Inject 65 Units into the skin at bedtime. 07/08/23   Alexander-Savino, Washington, MD  Insulin Pen Needle (BD PEN NEEDLE NANO U/F) 32G X 4 MM MISC 1 Units by Does not apply route at bedtime. 10/04/23   [name], [name], DO  Iron, Ferrous Sulfate, 325 (65 Fe) MG TABS Take 1 tablet by mouth daily. 06/02/21   [provider]  isosorbide mononitrate (IMDUR) 30 MG 24 hr tablet Take 1 tablet by mouth once daily 09/01/23   [name], Florentina Addison, DO  Lancet Device MISC 1 each by Does not apply route in the morning, at noon, and at bedtime. May substitute  to any manufacturer covered by patient's insurance. 02/16/23   [name], Florentina Addison, DO  metFORMIN (GLUCOPHAGE) 1000 MG tablet Take 1 tablet (1,000 mg total) by mouth 2 (two) times daily with a meal. 09/21/22 05/26/24  Adron Bene, MD  montelukast (SINGULAIR) 10 MG tablet Take 1 tablet (10 mg total) by mouth daily. 09/21/22 02/18/24  Adron Bene, MD  Multiple Vitamin (MULTIVITAMIN WITH MINERALS) TABS tablet Take 1 tablet by mouth daily. 03/10/21   Almon Hercules, MD  nitroGLYCERIN (NITROSTAT) 0.4 MG SL tablet Place 1 tablet (0.4 mg total) under the tongue  every 5 (five) minutes as needed for chest pain. 02/17/23   [name], [name], DO  olmesartan (BENICAR) 20 MG tablet Take 1 tablet (20 mg total) by mouth daily. 07/07/23   Alexander-Savino, Washington, MD  rosuvastatin (CRESTOR) 40 MG tablet Take 1 tablet (40 mg total) by mouth daily. 05/12/23   Parke Poisson, MD      Allergies    Adhesive [tape]    Review of Systems   Review of Systems  All other systems reviewed and are negative.   Physical Exam Updated Vital Signs BP 132/74   Pulse 90   Temp 98.2 F (36.8 C)   Resp 16   SpO2 100%  Physical Exam Vitals and nursing note reviewed.  Constitutional:      General: He is not in acute distress.    Appearance: Normal appearance. He is well-developed. He is not toxic-appearing.  HENT:     Head: Normocephalic and atraumatic.  Eyes:     General: Lids are normal.     Conjunctiva/sclera: Conjunctivae normal.     Pupils: Pupils are equal, round, and reactive to light.  Neck:     Thyroid: No thyroid mass.     Trachea: No tracheal deviation.  Cardiovascular:     Rate and Rhythm: Normal rate and regular rhythm.     Heart sounds: Normal heart sounds. No murmur heard.    No gallop.  Pulmonary:     Effort: Pulmonary effort is normal. No respiratory distress.     Breath sounds: Normal breath sounds. No stridor. No decreased breath sounds, wheezing, rhonchi or rales.  Abdominal:     General: There is no distension.     Palpations: Abdomen is soft.     Tenderness: There is no abdominal tenderness. There is no rebound.    Genitourinary:    Comments: No stool appreciated Musculoskeletal:        General: No tenderness. Normal range of motion.     Cervical back: Normal range of motion and neck supple.  Skin:    General: Skin is warm and dry.     Findings: No abrasion or rash.  Neurological:     Mental Status: He is alert and oriented to person, place, and time. Mental status is at baseline.     GCS: GCS eye subscore is 4. GCS verbal  subscore is 5. GCS motor subscore is 6.     Cranial Nerves: No cranial nerve deficit.     Sensory: No sensory deficit.     Motor: Motor function is intact.  Psychiatric:        Attention and Perception: Attention normal.        Speech: Speech normal.        Behavior: Behavior normal.    ED Results / Procedures / Treatments   Labs (all labs ordered are listed, but only abnormal results are displayed) Labs Reviewed  CBC WITH DIFFERENTIAL/PLATELET  BASIC  METABOLIC PANEL WITH GFR    EKG None  Radiology No results found.  Procedures Procedures  {Document cardiac monitor, telemetry assessment procedure when appropriate:1}  Medications Ordered in ED Medications  0.9 %  sodium chloride infusion (has no administration in time range)    ED Course/ Medical Decision Making/ A&P   {   Click here for ABCD2, HEART and other calculatorsREFRESH Note before signing :1}                              Medical Decision Making Amount and/or Complexity of Data Reviewed Labs: ordered. Radiology: ordered.  Risk Prescription drug management.   ***  {Document critical care time when appropriate:1} {Document review of labs and clinical decision tools ie heart score, Chads2Vasc2 etc:1}  {Document your independent review of radiology images, and any outside records:1} {Document your discussion with family members, caretakers, and with consultants:1} {Document social determinants of health affecting pt's care:1} {Document your decision making why or why not admission, treatments were needed:1} Final Clinical Impression(s) / ED Diagnoses Final diagnoses:  None    Rx / DC Orders ED Discharge Orders     None

## 2023-11-14 NOTE — Progress Notes (Signed)
 Internal Medicine Clinic Attending  Case discussed with the resident at the time of the visit.  We reviewed the resident's history and exam and pertinent patient test results.  I agree with the assessment, diagnosis, and plan of care documented in the resident's note.

## 2023-11-30 ENCOUNTER — Telehealth: Payer: Self-pay

## 2023-11-30 NOTE — Telephone Encounter (Signed)
 Tried to call patient back as he called inquiring about DM shoe status  Dm ppw has been received inserts are being made as of 4/14 everything should be shipped by May 1st  I could not leave a message for patient If he calls back we can set him appt anytime after 5/5 would be safe  \\Anabeth Chilcott Cped, CFo, CFm

## 2023-12-01 ENCOUNTER — Ambulatory Visit (HOSPITAL_COMMUNITY)
Admission: RE | Admit: 2023-12-01 | Discharge: 2023-12-01 | Disposition: A | Discharge status: Home / Self Care | Payer: 59 | Source: Ambulatory Visit | Attending: Vascular Surgery | Admitting: Vascular Surgery

## 2023-12-01 ENCOUNTER — Ambulatory Visit (INDEPENDENT_AMBULATORY_CARE_PROVIDER_SITE_OTHER): Payer: 59 | Admitting: Physician Assistant

## 2023-12-01 ENCOUNTER — Ambulatory Visit (HOSPITAL_BASED_OUTPATIENT_CLINIC_OR_DEPARTMENT_OTHER)
Admission: RE | Admit: 2023-12-01 | Discharge: 2023-12-01 | Disposition: A | Discharge status: Home / Self Care | Payer: 59 | Source: Ambulatory Visit | Attending: Vascular Surgery | Admitting: Vascular Surgery

## 2023-12-01 VITALS — BP 141/81 | HR 85 | Temp 98.3°F | Ht 71.0 in | Wt 215.5 lb

## 2023-12-01 DIAGNOSIS — I739 Peripheral vascular disease, unspecified: Secondary | ICD-10-CM

## 2023-12-01 DIAGNOSIS — Z959 Presence of cardiac and vascular implant and graft, unspecified: Secondary | ICD-10-CM

## 2023-12-01 LAB — VAS US ABI WITH/WO TBI
Left ABI: 0.44
Right ABI: 0.69

## 2023-12-01 NOTE — Progress Notes (Signed)
 Office Note   History of Present Illness   Andres Richardson is a 58 y.o. (Aug 26, 1965) male who presents for follow-up.  He has a history of right lower extremity angiogram with SFA balloon angioplasty and stenting on 06/01/2023 by Dr. Karin Lieu.  This was done for critical limb ischemia with right foot tissue loss.  He subsequently underwent right TMA by podiatry on 06/02/2023.  He returns today for follow-up.  He says that he is doing fairly well.  His right TMA is completely healed.  He is still mobilizing in a boot on the right until he can get his special shoe.  He denies any rest pain or tissue loss.  He is taking a daily aspirin, Plavix, and statin.  Current Outpatient Medications  Medication Sig Dispense Refill   acetaminophen (TYLENOL) 500 MG tablet Take 2 tablets (1,000 mg total) by mouth every 6 (six) hours as needed for moderate pain (pain score 4-6). 30 tablet 0   amLODipine (NORVASC) 5 MG tablet Take 1 tablet (5 mg total) by mouth daily. 30 tablet 11   aspirin EC 81 MG tablet Take 1 tablet (81 mg total) by mouth daily. Swallow whole. 90 tablet 3   Blood Glucose Monitoring Suppl DEVI May substitute to any manufacturer covered by patient's insurance. 1 each 0   calcium carbonate (TUMS - DOSED IN MG ELEMENTAL CALCIUM) 500 MG chewable tablet Chew 1 tablet by mouth at bedtime.     clopidogrel (PLAVIX) 75 MG tablet Take 1 tablet (75 mg total) by mouth daily. 30 tablet 6   Continuous Glucose Sensor (DEXCOM G7 SENSOR) MISC USE AS DIRECTED ONCE A WEEK 3 each 0   docusate sodium (COLACE) 100 MG capsule Take 1 capsule (100 mg total) by mouth every 12 (twelve) hours. 60 capsule 0   DULoxetine (CYMBALTA) 60 MG capsule Take 1 capsule (60 mg total) by mouth daily. 90 capsule 3   furosemide (LASIX) 40 MG tablet Take 1 tablet by mouth once daily 90 tablet 0   gabapentin (NEURONTIN) 100 MG capsule Take 3 capsules (300 mg total) by mouth 3 (three) times daily. 540 capsule 5   gentamicin ointment  (GARAMYCIN) 0.1 % Apply 1 Application topically daily. 30 g 3   Glucagon (GVOKE HYPOPEN 1-PACK) 1 MG/0.2ML SOAJ Inject 1 mg into the skin as needed (low blood sugar with impaired consciousness). 0.4 mL 2   Glucose Blood (BLOOD GLUCOSE TEST STRIPS) STRP 1 each by In Vitro route in the morning, at noon, and at bedtime. May substitute to any manufacturer covered by patient's insurance. 300 strip 3   Homeopathic Products (LEG CRAMPS) TABS Take 3 tablets by mouth daily as needed (leg cramps).     insulin degludec (TRESIBA) 100 UNIT/ML FlexTouch Pen Inject 65 Units into the skin at bedtime. 24 mL 11   Insulin Pen Needle (BD PEN NEEDLE NANO U/F) 32G X 4 MM MISC 1 Units by Does not apply route at bedtime. 90 each 3   Iron, Ferrous Sulfate, 325 (65 Fe) MG TABS Take 1 tablet by mouth daily.     isosorbide mononitrate (IMDUR) 30 MG 24 hr tablet Take 1 tablet by mouth once daily 90 tablet 3   Lancet Device MISC 1 each by Does not apply route in the morning, at noon, and at bedtime. May substitute to any manufacturer covered by patient's insurance. 1 each 0   metFORMIN (GLUCOPHAGE) 1000 MG tablet Take 1 tablet (1,000 mg total) by mouth 2 (two) times daily  with a meal. 180 tablet 1   montelukast (SINGULAIR) 10 MG tablet Take 1 tablet (10 mg total) by mouth daily. 30 tablet 2   Multiple Vitamin (MULTIVITAMIN WITH MINERALS) TABS tablet Take 1 tablet by mouth daily. 90 tablet 1   nitroGLYCERIN (NITROSTAT) 0.4 MG SL tablet Place 1 tablet (0.4 mg total) under the tongue every 5 (five) minutes as needed for chest pain. 20 tablet 3   olmesartan (BENICAR) 20 MG tablet Take 1 tablet (20 mg total) by mouth daily. 90 tablet 3   rosuvastatin (CRESTOR) 40 MG tablet Take 1 tablet (40 mg total) by mouth daily. 90 tablet 3   ezetimibe (ZETIA) 10 MG tablet Take 1 tablet (10 mg total) by mouth daily. 30 tablet 0   No current facility-administered medications for this visit.    REVIEW OF SYSTEMS (negative unless checked):    Cardiac:  []  Chest pain or chest pressure? []  Shortness of breath upon activity? []  Shortness of breath when lying flat? []  Irregular heart rhythm?  Vascular:  []  Pain in calf, thigh, or hip brought on by walking? []  Pain in feet at night that wakes you up from your sleep? []  Blood clot in your veins? []  Leg swelling?  Pulmonary:  []  Oxygen at home? []  Productive cough? []  Wheezing?  Neurologic:  []  Sudden weakness in arms or legs? []  Sudden numbness in arms or legs? []  Sudden onset of difficult speaking or slurred speech? []  Temporary loss of vision in one eye? []  Problems with dizziness?  Gastrointestinal:  []  Blood in stool? []  Vomited blood?  Genitourinary:  []  Burning when urinating? []  Blood in urine?  Psychiatric:  []  Major depression  Hematologic:  []  Bleeding problems? []  Problems with blood clotting?  Dermatologic:  []  Rashes or ulcers?  Constitutional:  []  Fever or chills?  Ear/Nose/Throat:  []  Change in hearing? []  Nose bleeds? []  Sore throat?  Musculoskeletal:  []  Back pain? []  Joint pain? []  Muscle pain?   Physical Examination   Vitals:   12/01/23 1445  BP: (!) 141/81  Pulse: 85  Temp: 98.3 F (36.8 C)  TempSrc: Temporal  SpO2: 98%  Weight: 215 lb 8 oz (97.8 kg)  Height: 5\' 11"  (1.803 m)   Body mass index is 30.06 kg/m.  General:  WDWN in NAD; vital signs documented above Gait: Not observed HENT: WNL, normocephalic Pulmonary: normal non-labored breathing , without rales, rhonchi,  wheezing Cardiac: regular Abdomen: soft, NT, no masses Skin: without rashes Vascular Exam/Pulses: Brisk right PT and peroneal Doppler signals Extremities: Well-healed right TMA Musculoskeletal: no muscle wasting or atrophy  Neurologic: A&O X 3;  No focal weakness or paresthesias are detected Psychiatric:  The pt has Normal affect.  Non-Invasive Vascular imaging   ABI (12/01/2023) R:  ABI: 0.69 (0.72),  PT: mono DP: mono TBI:   amp L:  ABI: 0.44 (0.43),  PT: mono DP: mono TBI: 0.32   RLE Arterial Duplex (12/01/2023) Patent right SFA stent without stenosis.  Unchanged 50% stenosis of the mid SFA, distal to his stent.  Medical Decision Making   Andres Richardson is a 58 y.o. male who presents for surveillance of PAD  Based on the patient's vascular studies, his ABIs bilaterally are essentially unchanged.  His right ABI 0.69 and left ABI 0.44 Arterial duplex demonstrates a patent right SFA stent without stenosis.  There is unchanged 50% stenosis of the mid SFA, just distal to his stent He denies any claudication, rest pain, or tissue loss.  His right TMA is well-healed On exam he has brisk right PT and peroneal Doppler signals Recommend that he continue daily aspirin, Plavix, statin.  He can follow-up with our office in 6 months with repeat right lower extremity arterial duplex and ABIs   Deneise Finlay PA-C [facility] Office: [phone]  Clinic MD: Rosalva Comber

## 2023-12-08 ENCOUNTER — Ambulatory Visit: Admitting: Student

## 2023-12-08 VITALS — BP 125/69 | HR 83 | Ht 71.0 in | Wt 218.9 lb

## 2023-12-08 DIAGNOSIS — I739 Peripheral vascular disease, unspecified: Secondary | ICD-10-CM

## 2023-12-08 DIAGNOSIS — R809 Proteinuria, unspecified: Secondary | ICD-10-CM | POA: Insufficient documentation

## 2023-12-08 DIAGNOSIS — E785 Hyperlipidemia, unspecified: Secondary | ICD-10-CM | POA: Diagnosis not present

## 2023-12-08 DIAGNOSIS — I10 Essential (primary) hypertension: Secondary | ICD-10-CM | POA: Diagnosis not present

## 2023-12-08 DIAGNOSIS — E1151 Type 2 diabetes mellitus with diabetic peripheral angiopathy without gangrene: Secondary | ICD-10-CM | POA: Diagnosis not present

## 2023-12-08 DIAGNOSIS — Z794 Long term (current) use of insulin: Secondary | ICD-10-CM | POA: Diagnosis not present

## 2023-12-08 DIAGNOSIS — E1165 Type 2 diabetes mellitus with hyperglycemia: Secondary | ICD-10-CM | POA: Diagnosis not present

## 2023-12-08 DIAGNOSIS — Z7984 Long term (current) use of oral hypoglycemic drugs: Secondary | ICD-10-CM

## 2023-12-08 LAB — POCT GLYCOSYLATED HEMOGLOBIN (HGB A1C): Hemoglobin A1C: 11.1 % — AB (ref 4.0–5.6)

## 2023-12-08 LAB — GLUCOSE, CAPILLARY: Glucose-Capillary: 325 mg/dL — ABNORMAL HIGH (ref 70–99)

## 2023-12-08 MED ORDER — TRULICITY 0.75 MG/0.5ML ~~LOC~~ SOAJ: 0.7500 mg | SUBCUTANEOUS | 0 refills | Status: DC

## 2023-12-08 MED ORDER — ROSUVASTATIN CALCIUM 40 MG PO TABS: 40.0000 mg | ORAL_TABLET | Freq: Every day | ORAL | 3 refills | Status: AC

## 2023-12-08 MED ORDER — AMLODIPINE BESYLATE 5 MG PO TABS: 5.0000 mg | ORAL_TABLET | Freq: Every day | ORAL | 11 refills | Status: DC

## 2023-12-08 MED ORDER — OLMESARTAN MEDOXOMIL 20 MG PO TABS: 20.0000 mg | ORAL_TABLET | Freq: Every day | ORAL | 3 refills | Status: DC

## 2023-12-08 MED ORDER — ASPIRIN 81 MG PO TBEC: 81.0000 mg | DELAYED_RELEASE_TABLET | Freq: Every day | ORAL | 3 refills | Status: AC

## 2023-12-08 MED ORDER — METFORMIN HCL 1000 MG PO TABS: 1000.0000 mg | ORAL_TABLET | Freq: Two times a day (BID) | ORAL | 1 refills | Status: DC

## 2023-12-08 NOTE — Assessment & Plan Note (Signed)
 SFA balloon angioplasty and stenting on 06/01/2023 by Dr. Rosalva Comber. He subsequently underwent right TMA by podiatry on 06/02/2023. He followed with [facility], had ABIs 12/01/2023 that was unchanged. presently on aspirin , Plavix  and statin.

## 2023-12-08 NOTE — Assessment & Plan Note (Signed)
 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 He was started on ARB losartan  12.5 mg to help with proteinuria, but had stopped due to AKI.

## 2023-12-08 NOTE — Assessment & Plan Note (Signed)
 OP medication regimen consists of metformin  at 1000 mg twice daily and Tresiba  65 units at night. Reports couple of low events "feeling sweaty" once or twice a month. Data is pulled, that shows 58% Very high and 23% in high range, his CBGs are in 300 range majority of the time. Questionable whether he is taking these medications. Per chart review, Ozempic was stopped due to anorexia, however with his uncontrolled DM, will try Trulicity. If he has decreased in appetite again, can consider trying DPP4i.   - Continue Metformin  1000 mg BID - Continue Tresiba  65 units  - Start Trulicity 0.75 mg once a week - return in one month to check Dexcom data

## 2023-12-08 NOTE — Patient Instructions (Addendum)
 Thank you, Mr.Leven V Posch for allowing us  to provide your care today. Today we discussed:  Diabetes - TAKE  Tresiba  65 units at night - START Trulicity 0.75 mg once a week - Continue metformin  1000 mg two times a day  I have ordered the following labs for you:   Lab Orders         Glucose, capillary         POC Hbg A1C      Tests ordered today:  A1c  Referrals ordered today:   Referral Orders  No referral(s) requested today     I have ordered the following medication/changed the following medications:   Stop the following medications: Medications Discontinued During This Encounter  Medication Reason   metFORMIN  (GLUCOPHAGE ) 1000 MG tablet Reorder   aspirin  EC 81 MG tablet Reorder   amLODipine  (NORVASC ) 5 MG tablet Reorder   rosuvastatin  (CRESTOR ) 40 MG tablet Reorder   olmesartan  (BENICAR ) 20 MG tablet Reorder     Start the following medications: Meds ordered this encounter  Medications   metFORMIN  (GLUCOPHAGE ) 1000 MG tablet    Sig: Take 1 tablet (1,000 mg total) by mouth 2 (two) times daily with a meal.    Dispense:  180 tablet    Refill:  1   amLODipine  (NORVASC ) 5 MG tablet    Sig: Take 1 tablet (5 mg total) by mouth daily.    Dispense:  30 tablet    Refill:  11   olmesartan  (BENICAR ) 20 MG tablet    Sig: Take 1 tablet (20 mg total) by mouth daily.    Dispense:  90 tablet    Refill:  3   rosuvastatin  (CRESTOR ) 40 MG tablet    Sig: Take 1 tablet (40 mg total) by mouth daily.    Dispense:  90 tablet    Refill:  3   aspirin  EC 81 MG tablet    Sig: Take 1 tablet (81 mg total) by mouth daily. Swallow whole.    Dispense:  90 tablet    Refill:  3   Dulaglutide (TRULICITY) 0.75 MG/0.5ML SOAJ    Sig: Inject 0.75 mg into the skin once a week.    Dispense:  2 mL    Refill:  0     Follow up:  1 month for diabetes check      Remember:   Should you have any questions or concerns please call the internal medicine clinic at [phone].     Lanney Pitts, DO [facility]

## 2023-12-08 NOTE — Assessment & Plan Note (Signed)
 Lab Results  Component Value Date   LDLCALC 30 06/02/2023   Last LDL 30 checked on 05/2023. Presently on Zetia  10 mg daily and Crestor  40 mg daily.

## 2023-12-08 NOTE — Assessment & Plan Note (Signed)
 BP Readings from Last 3 Encounters:  12/08/23 125/69  12/01/23 (!) 141/81  11/12/23 (!) 155/65   Op medication amlodipine  5 mg and olmesartan  20 mg daily ([facility] to [facility]).

## 2023-12-08 NOTE — Progress Notes (Addendum)
 Established Patient Office Visit  Subjective   Patient ID: Andres Richardson, male    DOB: [birth year]  Age: 58 y.o. MRN: [id number]  Chief Complaint  Patient presents with   Follow-up    4 wk follow up  Med reconciliation     HPI Andres Richardson is a 58 y.o. male with a past medical history of diabetes with history of fournier's gangreene and DKA, colostomy, HLD, HFpEF, right lower extremity angiogram with SFA balloon angioplasty and stenting on 06/01/2023 by Dr. Rosalva Comber, and a right TMA by podiatry on 06/02/2023.  who presents today for follow-up on Diabetes.    Past Medical History:  Diagnosis Date   CAD (coronary artery disease) 2022     Prox RCA lesion is 100% stenosed.   Prox Cx to Mid Cx lesion is 60% stenosed.   LPAV lesion is 60% stenosed.   Ost LAD to Prox LAD lesion is 40% stenosed.   Diabetes mellitus without complication (HCC) 1995   Type !! 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 Diabetic neuropathy.   Fournier gangrene 2022   Grade I diastolic dysfunction 12/02/2021   Grade I diastolic dysfunction 12/02/2021   History of creation of ostomy (HCC)    Hyperlipidemia    Mixed   Hypertension    Essential   Obesity    Peripheral arterial disease (HCC)    occluded left common iliac artery   Proteinuria    Renal disorder    Thyroid  disease    Tobacco use disorder    ROS   AS per assessment and plan  Objective:     BP 125/69 (BP Location: Left Arm, Patient Position: Sitting, Cuff Size: Normal)   Pulse 83   Ht 5\' 11"  (1.803 m)   Wt 218 lb 14.4 oz (99.3 kg)   SpO2 100%   BMI 30.53 kg/m  BP Readings from Last 3 Encounters:  12/08/23 125/69  12/01/23 (!) 141/81  11/12/23 (!) 155/65   Wt Readings from Last 3 Encounters:  12/08/23 218 lb 14.4 oz (99.3 kg)  12/01/23 215 lb 8 oz (97.8 kg)  11/09/23 211 lb 14.4 oz (96.1 kg)   SpO2 Readings from Last 3 Encounters:  12/08/23 100%  12/01/23 98%  11/12/23 100%      Physical Exam  General: no  acute distress Cardiovascular: Regular rate, no murmurs appreciated Pulmonary: Breathing comfortably, no wheezing or crackles Abdominal: soft, ostomy in place to left lower quadrant, stoma is well-perfused MSK: Right foot TMA, boot remains in place, left lower extremity without wounds  Results for orders placed or performed in visit on 12/08/23  Glucose, capillary  Result Value Ref Range   Glucose-Capillary 325 (H) 70 - 99 mg/dL  POC Hbg W0J  Result Value Ref Range   Hemoglobin A1C 11.1 (A) 4.0 - 5.6 %   HbA1c POC (<> result, manual entry)     HbA1c, POC (prediabetic range)     HbA1c, POC (controlled diabetic range)      Last CBC Lab Results  Component Value Date   WBC 9.2 11/12/2023   HGB 10.4 (L) 11/12/2023   HCT 32.3 (L) 11/12/2023   MCV 87.3 11/12/2023   MCH 28.1 11/12/2023   RDW 15.7 (H) 11/12/2023   PLT 227 11/12/2023   Last metabolic panel Lab Results  Component Value Date   GLUCOSE 359 (H) 11/12/2023   NA 136 11/12/2023   K 4.4 11/12/2023   CL 104 11/12/2023   CO2 23 11/12/2023   BUN 24 (  H) 11/12/2023   CREATININE 1.74 (H) 11/12/2023   GFRNONAA 45 (L) 11/12/2023   CALCIUM  8.2 (L) 11/12/2023   PHOS 3.4 06/06/2023   PROT 6.3 (L) 05/26/2023   ALBUMIN  1.9 (L) 06/06/2023   LABGLOB 2.7 01/19/2023   AGRATIO 1.3 01/19/2023   BILITOT 0.6 05/26/2023   ALKPHOS 81 05/26/2023   AST 7 (L) 05/26/2023   ALT 7 05/26/2023   ANIONGAP 9 11/12/2023   Last lipids Lab Results  Component Value Date   CHOL 80 06/02/2023   HDL 24 (L) 06/02/2023   LDLCALC 30 06/02/2023   LDLDIRECT 60.0 03/23/2023   TRIG 130 06/02/2023   CHOLHDL 3.3 06/02/2023   Last hemoglobin A1c Lab Results  Component Value Date   HGBA1C 11.1 (A) 12/08/2023      The ASCVD Risk score (Arnett DK, et al., 2019) failed to calculate for the following reasons:   Risk score cannot be calculated because patient has a medical history suggesting prior/existing ASCVD    Assessment & Plan:  Patient is  discussed with Dr Adriane Albe   Problem List Items Addressed This Visit       Cardiovascular and Mediastinum   Essential hypertension   BP Readings from Last 3 Encounters:  12/08/23 125/69  12/01/23 (!) 141/81  11/12/23 (!) 155/65   Op medication amlodipine  5 mg and olmesartan  20 mg daily ([facility]).      Relevant Medications   amLODipine  (NORVASC ) 5 MG tablet   olmesartan  (BENICAR ) 20 MG tablet   rosuvastatin  (CRESTOR ) 40 MG tablet   aspirin  EC 81 MG tablet   PAD (peripheral artery disease) (HCC)   SFA balloon angioplasty and stenting on 06/01/2023 by Dr. Rosalva Comber. He subsequently underwent right TMA by podiatry on 06/02/2023. He followed with [facility], had ABIs 12/01/2023 that was unchanged. presently on aspirin , Plavix  and statin.       Relevant Medications   amLODipine  (NORVASC ) 5 MG tablet   olmesartan  (BENICAR ) 20 MG tablet   rosuvastatin  (CRESTOR ) 40 MG tablet   aspirin  EC 81 MG tablet     Endocrine   Uncontrolled type 2 diabetes mellitus with hyperglycemia, with long-term current use of insulin  (HCC) - Primary   OP medication regimen consists of metformin  at 1000 mg twice daily and Tresiba  65 units at night. Reports couple of low events "feeling sweaty" once or twice a month. Data is pulled, that shows 58% Very high and 23% in high range, his CBGs are in 300 range majority of the time. Questionable whether he is taking these medications. Per chart review, Ozempic was stopped due to anorexia, however with his uncontrolled DM, will try Trulicity. If he has decreased in appetite again, can consider trying DPP4i.   - Continue Metformin  1000 mg BID - Continue Tresiba  65 units  - Start Trulicity 0.75 mg once a week - return in one month to check Dexcom data      Relevant Medications   metFORMIN  (GLUCOPHAGE ) 1000 MG tablet   olmesartan  (BENICAR ) 20 MG tablet   rosuvastatin  (CRESTOR ) 40 MG tablet   aspirin  EC 81 MG tablet   Dulaglutide (TRULICITY) 0.75 MG/0.5ML  SOAJ   Other Relevant Orders   POC Hbg A1C (Completed)   Glucose, capillary (Completed)   Microalbuminuria due to type 2 diabetes mellitus [facility])   Lab Results  Component Value Date   LABMICR See below: 10/19/2022   LABMICR 3,228.5 10/19/2022  Unable to tolerate SGLT2i due to hx of fournier's gangrene. He was started on  ARB losartan  12.5 mg to help with proteinuria, but had stopped due to AKI.       Relevant Medications   metFORMIN  (GLUCOPHAGE ) 1000 MG tablet   olmesartan  (BENICAR ) 20 MG tablet   rosuvastatin  (CRESTOR ) 40 MG tablet   aspirin  EC 81 MG tablet   Dulaglutide (TRULICITY) 0.75 MG/0.5ML SOAJ     Other   Hyperlipidemia   Lab Results  Component Value Date   LDLCALC 30 06/02/2023   Last LDL 30 checked on 05/2023. Presently on Zetia  10 mg daily and Crestor  40 mg daily.       Relevant Medications   amLODipine  (NORVASC ) 5 MG tablet   olmesartan  (BENICAR ) 20 MG tablet   rosuvastatin  (CRESTOR ) 40 MG tablet   aspirin  EC 81 MG tablet    Return in about 1 month (around 01/07/2024) for Diabetes .    Lanney Pitts, DO

## 2023-12-15 ENCOUNTER — Other Ambulatory Visit: Payer: Self-pay | Admitting: *Deleted

## 2023-12-15 DIAGNOSIS — I739 Peripheral vascular disease, unspecified: Secondary | ICD-10-CM

## 2023-12-15 DIAGNOSIS — Z959 Presence of cardiac and vascular implant and graft, unspecified: Secondary | ICD-10-CM

## 2023-12-15 DIAGNOSIS — I70239 Atherosclerosis of native arteries of right leg with ulceration of unspecified site: Secondary | ICD-10-CM

## 2023-12-16 NOTE — Progress Notes (Signed)
 Internal Medicine Clinic Attending  Case discussed with the resident at the time of the visit.  We reviewed the resident's history and exam and pertinent patient test results.  I agree with the assessment, diagnosis, and plan of care documented in the resident's note.

## 2023-12-30 ENCOUNTER — Telehealth: Payer: Self-pay

## 2023-12-30 NOTE — Telephone Encounter (Signed)
 Diabetic shoes are here/ paperwork expires on 05/10/24

## 2023-12-30 NOTE — Telephone Encounter (Signed)
 LVM to schedule DM shoe PU

## 2023-12-31 ENCOUNTER — Other Ambulatory Visit: Payer: Self-pay | Admitting: Student

## 2023-12-31 DIAGNOSIS — E1165 Type 2 diabetes mellitus with hyperglycemia: Secondary | ICD-10-CM

## 2024-01-02 NOTE — Telephone Encounter (Signed)
 Medication sent to pharmacy

## 2024-01-05 ENCOUNTER — Ambulatory Visit (INDEPENDENT_AMBULATORY_CARE_PROVIDER_SITE_OTHER): Admitting: Student

## 2024-01-05 ENCOUNTER — Encounter: Payer: Self-pay | Admitting: Student

## 2024-01-05 ENCOUNTER — Other Ambulatory Visit: Payer: Self-pay

## 2024-01-05 VITALS — BP 134/53 | HR 79 | Temp 97.9°F | Ht 71.0 in | Wt 228.4 lb

## 2024-01-05 DIAGNOSIS — I10 Essential (primary) hypertension: Secondary | ICD-10-CM | POA: Diagnosis not present

## 2024-01-05 DIAGNOSIS — E1165 Type 2 diabetes mellitus with hyperglycemia: Secondary | ICD-10-CM | POA: Diagnosis not present

## 2024-01-05 DIAGNOSIS — Z7984 Long term (current) use of oral hypoglycemic drugs: Secondary | ICD-10-CM

## 2024-01-05 DIAGNOSIS — Z7985 Long-term (current) use of injectable non-insulin antidiabetic drugs: Secondary | ICD-10-CM

## 2024-01-05 DIAGNOSIS — Z794 Long term (current) use of insulin: Secondary | ICD-10-CM | POA: Diagnosis not present

## 2024-01-05 MED ORDER — INSULIN DEGLUDEC 100 UNIT/ML ~~LOC~~ SOPN: 60.0000 [IU] | PEN_INJECTOR | Freq: Every day | SUBCUTANEOUS | 11 refills | Status: DC

## 2024-01-05 NOTE — Patient Instructions (Signed)
 Thank you so much for coming to the clinic today!   Please stop taking the amlodipine . I will call back regarding the labs.  Please start taking Tresiba  60 units per day as well as the Trulicity once per week.   We will see you in one month.   If you have any questions please feel free to the call the clinic at anytime at [phone]. It was a pleasure seeing you!  Best, Dr. Carolee Churchman

## 2024-01-05 NOTE — Assessment & Plan Note (Signed)
 Last seen in the clinic on 12/08/2023.  At that time, his A1c had increased from 8.9 to 11.1.  His current regimen consist of metformin  1000 mg twice daily, Tresiba  65 units at night, and Trulicity 0.75 mg/week.  His Dexcom noted numerous values in the 300s, and there were questions as to whether he was taking his medications.  He has not been taking his Trulicity during this time as he was unsure how to take it.  He was counseled on how to appropriate administer this medication today.  Multiple lows past two weeks as low as 46 on 01/01/24. Doesn't eat lunch with coincides with lows around 3 PM. Feels warm during these lows. Taking insulin  between 10 PM - 12 AM. Has been taking 70 units.  Given his persistent lows, will decrease this to 60 units.   He is to follow-up in 1 month.  He will likely need prandial dosing but will hold off given changes to his regimen.  He was counseled on the importance of returning with his Dexcom values. - Decrease Tresiba  to 60 units/day - Resume Trulicity 0.75 mg/week - Follow-up urine microalbumin

## 2024-01-05 NOTE — Assessment & Plan Note (Signed)
 Per patient, he is currently taking amlodipine  5 mg/day and olmesartan  20 mg/day.  His blood pressure today has been low.  His diastolic was as low as 29.  Repeat BP values were 49 and 53.  Given that he has not taken his medications this morning, I will discontinue amlodipine  at this time.  He also had an elevated creatinine at his last visit suggestive of an AKI.  Will repeat BMP today.  If this is still elevated, will discontinue his olmesartan . - Follow-up BMP - If creatinine elevated, discontinue olmesartan 

## 2024-01-05 NOTE — Progress Notes (Cosign Needed Addendum)
 CC: Diabetes follow-up  HPI: [name] is a 58 y.o. male living with a history stated below and presents today for diabetes follow-up. Please see problem based assessment and plan for additional details.  Past Medical History:  Diagnosis Date   CAD (coronary artery disease) 2022     Prox RCA lesion is 100% stenosed.   Prox Cx to Mid Cx lesion is 60% stenosed.   LPAV lesion is 60% stenosed.   Ost LAD to Prox LAD lesion is 40% stenosed.   Diabetes mellitus without complication (HCC) 1995   Type !! 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 Diabetic neuropathy.   Fournier gangrene 2022   Grade I diastolic dysfunction 12/02/2021   Grade I diastolic dysfunction 12/02/2021   History of creation of ostomy (HCC)    Hyperlipidemia    Mixed   Hypertension    Essential   Obesity    Peripheral arterial disease (HCC)    occluded left common iliac artery   Proteinuria    Renal disorder    Thyroid  disease    Tobacco use disorder     Current Outpatient Medications on File Prior to Visit  Medication Sig Dispense Refill   TRULICITY 0.75 MG/0.5ML SOAJ INJECT 0.75 MG UNDER THE SKIN ONCE A WEEK 2 mL 0   acetaminophen  (TYLENOL ) 500 MG tablet Take 2 tablets (1,000 mg total) by mouth every 6 (six) hours as needed for moderate pain (pain score 4-6). 30 tablet 0   aspirin  EC 81 MG tablet Take 1 tablet (81 mg total) by mouth daily. Swallow whole. 90 tablet 3   Blood Glucose Monitoring Suppl DEVI May substitute to any manufacturer covered by patient's insurance. 1 each 0   calcium  carbonate (TUMS - DOSED IN MG ELEMENTAL CALCIUM ) 500 MG chewable tablet Chew 1 tablet by mouth at bedtime.     clopidogrel  (PLAVIX ) 75 MG tablet Take 1 tablet (75 mg total) by mouth daily. 30 tablet 6   Continuous Glucose Sensor (DEXCOM G7 SENSOR) MISC USE AS DIRECTED ONCE A WEEK 3 each 0   docusate sodium  (COLACE) 100 MG capsule Take 1 capsule (100 mg total) by mouth every 12 (twelve) hours. 60 capsule 0    DULoxetine  (CYMBALTA ) 60 MG capsule Take 1 capsule (60 mg total) by mouth daily. 90 capsule 3   ezetimibe  (ZETIA ) 10 MG tablet Take 1 tablet (10 mg total) by mouth daily. 30 tablet 0   furosemide  (LASIX ) 40 MG tablet Take 1 tablet by mouth once daily 90 tablet 0   gabapentin  (NEURONTIN ) 100 MG capsule Take 3 capsules (300 mg total) by mouth 3 (three) times daily. 540 capsule 5   gentamicin  ointment (GARAMYCIN ) 0.1 % Apply 1 Application topically daily. 30 g 3   Glucagon  (GVOKE HYPOPEN  1-PACK) 1 MG/0.2ML SOAJ Inject 1 mg into the skin as needed (low blood sugar with impaired consciousness). 0.4 mL 2   Glucose Blood (BLOOD GLUCOSE TEST STRIPS) STRP 1 each by In Vitro route in the morning, at noon, and at bedtime. May substitute to any manufacturer covered by patient's insurance. 300 strip 3   Homeopathic Products (LEG CRAMPS) TABS Take 3 tablets by mouth daily as needed (leg cramps).     Insulin  Pen Needle (BD PEN NEEDLE NANO U/F) 32G X 4 MM MISC 1 Units by Does not apply route at bedtime. 90 each 3   Iron, Ferrous Sulfate , 325 (65 Fe) MG TABS Take 1 tablet by mouth daily.     isosorbide  mononitrate (IMDUR ) 30  MG 24 hr tablet Take 1 tablet by mouth once daily 90 tablet 3   Lancet Device MISC 1 each by Does not apply route in the morning, at noon, and at bedtime. May substitute to any manufacturer covered by patient's insurance. 1 each 0   metFORMIN  (GLUCOPHAGE ) 1000 MG tablet Take 1 tablet (1,000 mg total) by mouth 2 (two) times daily with a meal. 180 tablet 1   montelukast  (SINGULAIR ) 10 MG tablet Take 1 tablet (10 mg total) by mouth daily. 30 tablet 2   Multiple Vitamin (MULTIVITAMIN WITH MINERALS) TABS tablet Take 1 tablet by mouth daily. 90 tablet 1   nitroGLYCERIN  (NITROSTAT ) 0.4 MG SL tablet Place 1 tablet (0.4 mg total) under the tongue every 5 (five) minutes as needed for chest pain. 20 tablet 3   olmesartan  (BENICAR ) 20 MG tablet Take 1 tablet (20 mg total) by mouth daily. 90 tablet 3    rosuvastatin  (CRESTOR ) 40 MG tablet Take 1 tablet (40 mg total) by mouth daily. 90 tablet 3   No current facility-administered medications on file prior to visit.    Family History  Problem Relation Age of Onset   CAD Mother    Colon cancer Father     Social History   Socioeconomic History   Marital status: Single    Spouse name: Not on file   Number of children: Not on file   Years of education: Not on file   Highest education level: Not on file  Occupational History   Not on file  Tobacco Use   Smoking status: Every Day    Current packs/day: 0.25    Types: Cigarettes   Smokeless tobacco: Never   Tobacco comments:    5 cigs per day/Uses Nicorette patch  Vaping Use   Vaping status: Never Used  Substance and Sexual Activity   Alcohol use: Never   Drug use: Never   Sexual activity: Not on file  Other Topics Concern   Not on file  Social History Narrative   Not on file   Social Drivers of Health   Financial Resource Strain: Low Risk  (09/12/2023)   Overall Financial Resource Strain (CARDIA)    Difficulty of Paying Living Expenses: Not hard at all  Food Insecurity: No Food Insecurity (09/12/2023)   Hunger Vital Sign    Worried About Running Out of Food in the Last Year: Never true    Ran Out of Food in the Last Year: Never true  Transportation Needs: No Transportation Needs (09/12/2023)   PRAPARE - Administrator, Civil Service (Medical): No    Lack of Transportation (Non-Medical): No  Physical Activity: Insufficiently Active (02/16/2023)   Exercise Vital Sign    Days of Exercise per Week: 3 days    Minutes of Exercise per Session: 20 min  Stress: No Stress Concern Present (09/12/2023)   [employer] of Occupational Health - Occupational Stress Questionnaire    Feeling of Stress : Not at all  Social Connections: Moderately Isolated (09/12/2023)   Social Connection and Isolation Panel [NHANES]    Frequency of Communication with Friends and Family: More  than three times a week    Frequency of Social Gatherings with Friends and Family: Three times a week    Attends Religious Services: More than 4 times per year    Active Member of Clubs or Organizations: No    Attends Banker Meetings: Never    Marital Status: Divorced  Catering manager Violence: Not At  Risk (09/12/2023)   Humiliation, Afraid, Rape, and Kick questionnaire    Fear of Current or Ex-Partner: No    Emotionally Abused: No    Physically Abused: No    Sexually Abused: No    Review of Systems: ROS negative except for what is noted on the assessment and plan.  Vitals:   01/05/24 1414 01/05/24 1419 01/05/24 1452  BP: (!) 123/29 (!) 125/49 (!) 134/53  Pulse: 84 76 79  Temp: 97.9 F (36.6 C)    TempSrc: Oral    SpO2: 100%    Weight: 228 lb 6.4 oz (103.6 kg)    Height: 5\' 11"  (1.803 m)      Physical Exam: Constitutional: well-appearing in no acute distress HENT: normocephalic atraumatic, mucous membranes moist Eyes: conjunctiva non-erythematous Neck: supple Cardiovascular: regular rate and rhythm, no m/r/g Pulmonary/Chest: normal work of breathing on room air, lungs clear to auscultation bilaterally Abdominal: soft, non-tender, non-distended MSK: normal bulk and tone Neurological: alert & oriented x 3, 5/5 strength in bilateral upper and lower extremities Skin: warm and dry  Assessment & Plan:   Uncontrolled type 2 diabetes mellitus with hyperglycemia, with long-term current use of insulin  (HCC) Last seen in the clinic on 12/08/2023.  At that time, his A1c had increased from 8.9 to 11.1.  His current regimen consist of metformin  1000 mg twice daily, Tresiba  65 units at night, and Trulicity 0.75 mg/week.  His Dexcom noted numerous values in the 300s, and there were questions as to whether he was taking his medications.  He has not been taking his Trulicity during this time as he was unsure how to take it.  He was counseled on how to appropriate administer  this medication today.  Multiple lows past two weeks as low as 46 on 01/01/24. Doesn't eat lunch with coincides with lows around 3 PM. Feels warm during these lows. Taking insulin  between 10 PM - 12 AM. Has been taking 70 units.  Given his persistent lows, will decrease this to 60 units.   He is to follow-up in 1 month.  He will likely need prandial dosing but will hold off given changes to his regimen.  He was counseled on the importance of returning with his Dexcom values. - Decrease Tresiba  to 60 units/day - Resume Trulicity 0.75 mg/week - Follow-up urine microalbumin  Essential hypertension Per patient, he is currently taking amlodipine  5 mg/day and olmesartan  20 mg/day.  His blood pressure today has been low.  His diastolic was as low as 29.  Repeat BP values were 49 and 53.  Given that he has not taken his medications this morning, I will discontinue amlodipine  at this time.  He also had an elevated creatinine at his last visit suggestive of an AKI.  Will repeat BMP today.  If this is still elevated, will discontinue his olmesartan . - Follow-up BMP - If creatinine elevated, discontinue olmesartan   Maxie Spaniel, MD  [facility], PGY-1 Date 01/05/2024 Time 3:03 PM   Addendum:  BMP resulted with potassium value of 5.7. Will recommend to patient discontinuing olmesartan , with nurse visit in 1 week to check BP and repeat BMP.  Patient was discussed with Dr Jarvis Mesa.   Addendum by Dr Jayson Michael, MD

## 2024-01-06 ENCOUNTER — Other Ambulatory Visit: Payer: Self-pay | Admitting: Internal Medicine

## 2024-01-06 DIAGNOSIS — I10 Essential (primary) hypertension: Secondary | ICD-10-CM

## 2024-01-06 LAB — BMP8+ANION GAP
Anion Gap: 14 mmol/L (ref 10.0–18.0)
BUN/Creatinine Ratio: 23 — ABNORMAL HIGH (ref 9–20)
BUN: 37 mg/dL — ABNORMAL HIGH (ref 6–24)
CO2: 16 mmol/L — ABNORMAL LOW (ref 20–29)
Calcium: 8.3 mg/dL — ABNORMAL LOW (ref 8.7–10.2)
Chloride: 111 mmol/L — ABNORMAL HIGH (ref 96–106)
Creatinine, Ser: 1.61 mg/dL — ABNORMAL HIGH (ref 0.76–1.27)
Glucose: 99 mg/dL (ref 70–99)
Potassium: 5.4 mmol/L — ABNORMAL HIGH (ref 3.5–5.2)
Sodium: 141 mmol/L (ref 134–144)
eGFR: 49 mL/min/{1.73_m2} — ABNORMAL LOW (ref >59)

## 2024-01-10 ENCOUNTER — Ambulatory Visit: Payer: Self-pay | Admitting: Student

## 2024-01-10 NOTE — Progress Notes (Signed)
 Attempted to call patient to discuss elevated Cr with potassium of 5.4 but unfortunately no reply. Will attempt to call again tomorrow but patient will need to discontinue his olmesartan  with nurse visit in one week to recheck BP and BMP. If no reply, will send letter.

## 2024-01-11 LAB — MICROALBUMIN / CREATININE URINE RATIO
Creatinine, Urine: 82.5 mg/dL
Microalb/Creat Ratio: 3477 mg/g{creat} — ABNORMAL HIGH (ref 0–29)
Microalbumin, Urine: 2868.8 ug/mL

## 2024-01-11 NOTE — Progress Notes (Signed)
 Attempted again to call patient to discuss abnormal labs.  No response.  See previous note for plan.  Will send letter discussing discontinuation of olmesartan  and to contact the clinic in 1 week for nurse visit.

## 2024-01-13 ENCOUNTER — Ambulatory Visit

## 2024-01-13 ENCOUNTER — Other Ambulatory Visit

## 2024-01-24 ENCOUNTER — Other Ambulatory Visit: Payer: Self-pay | Admitting: Internal Medicine

## 2024-01-24 DIAGNOSIS — E1165 Type 2 diabetes mellitus with hyperglycemia: Secondary | ICD-10-CM

## 2024-01-26 IMAGING — CT CT ABD-PELV W/ CM
2 of 5 series · 14 of 46 positions shown, 16 images · IV contrast (agent unspecified)
Comparison: September 07, 2021

CLINICAL DATA: Abdominal pain, nonlocalized drainage from perineum.
History of necrotizing fasciitis.

EXAM:
CT ABDOMEN AND PELVIS WITH CONTRAST
TECHNIQUE: Multidetector CT imaging of the abdomen and pelvis was performed
using the standard protocol following bolus administration of
intravenous contrast.

[Series 2: axial st · axial · 0.93mm/px · z∈[-626,-106]mm · 11 of 122 slices shown, 13 images]
[im 9/122  soft-tissue]
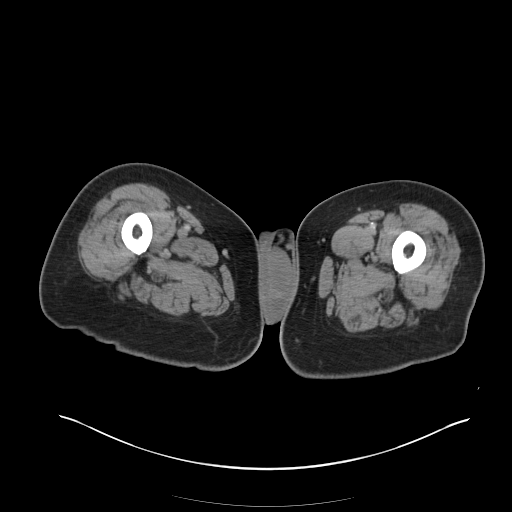
[im 9/122  bone]
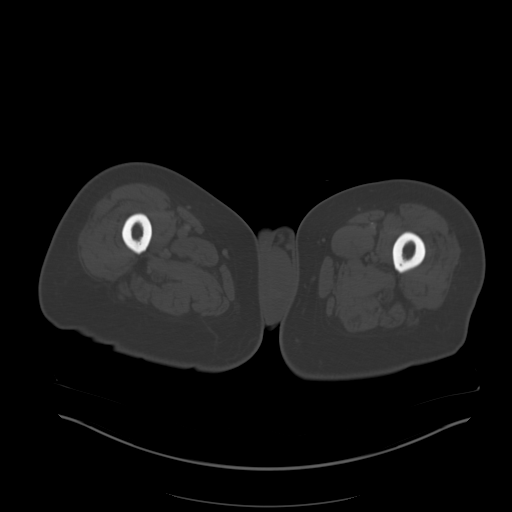
[im 17/122  soft-tissue]
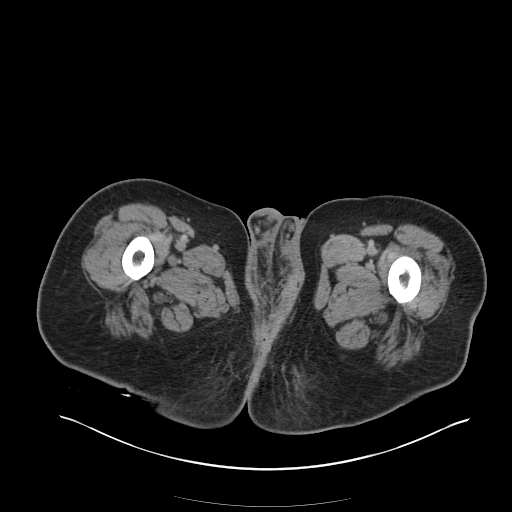
[im 33/122  soft-tissue]
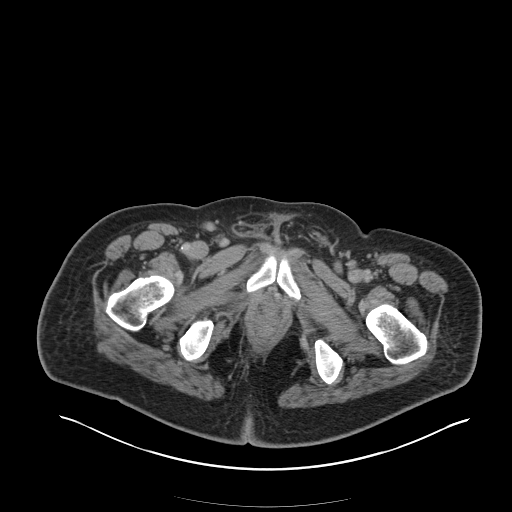
[im 41/122  soft-tissue]
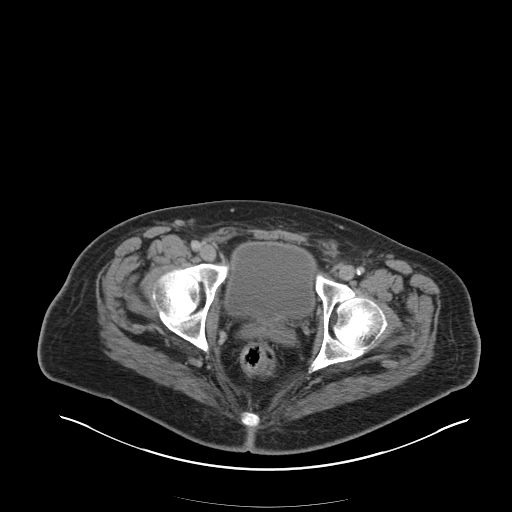
[im 49/122  soft-tissue]
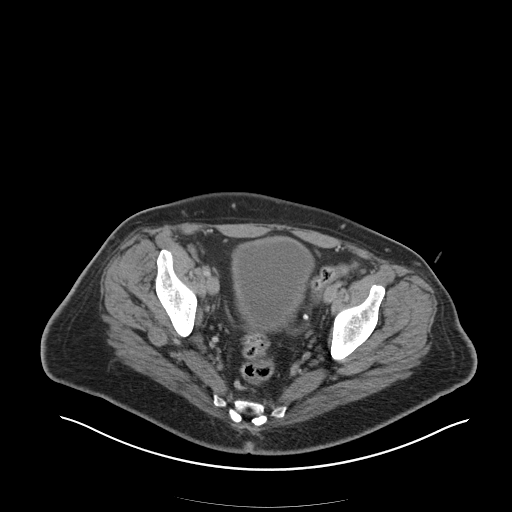
[im 65/122  soft-tissue]
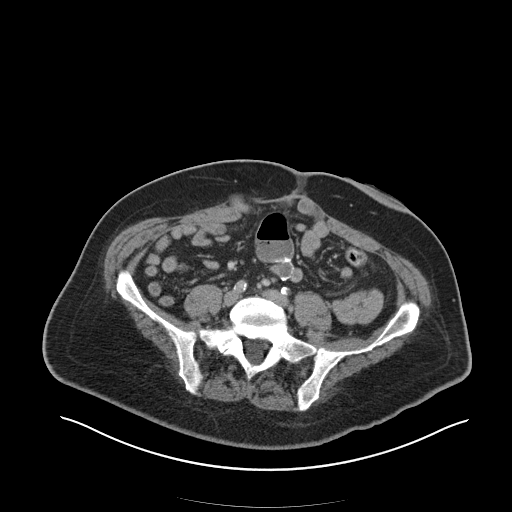
[im 73/122  soft-tissue]
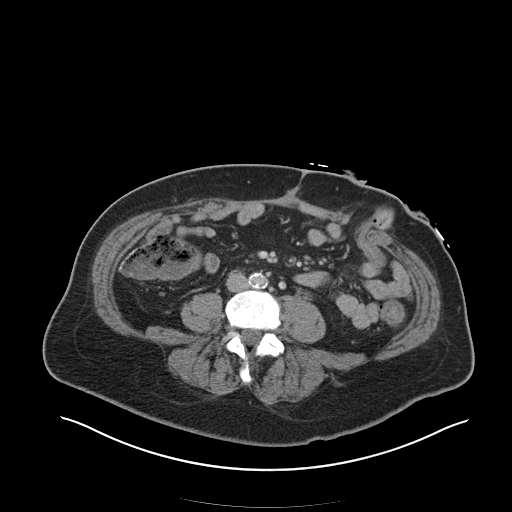
[im 81/122  soft-tissue]
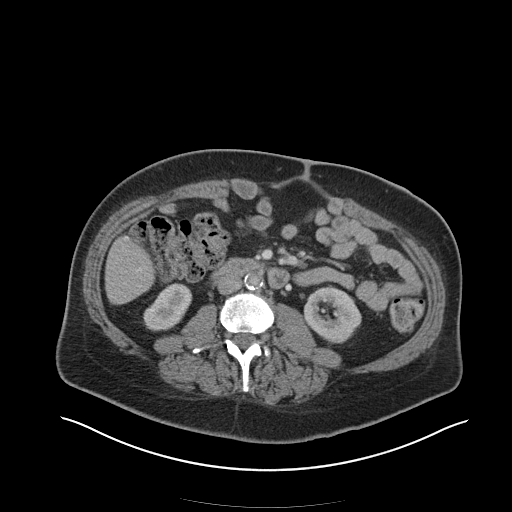
[im 89/122  soft-tissue]
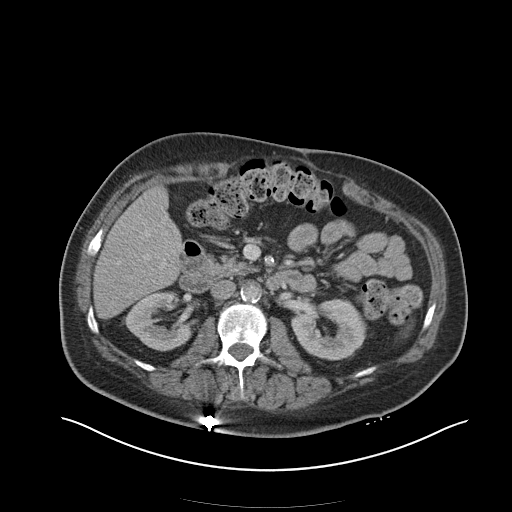
[im 89/122  bone]
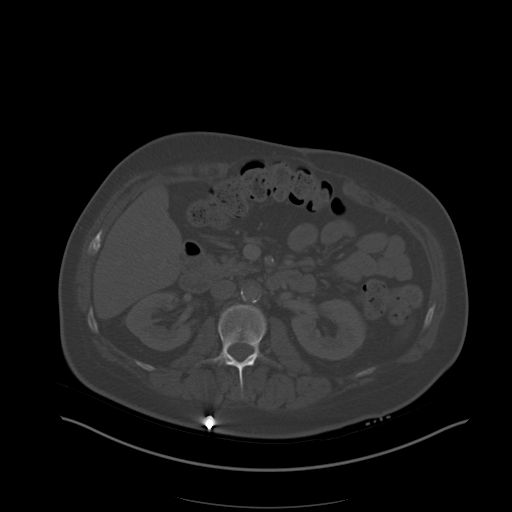
[im 105/122  soft-tissue]
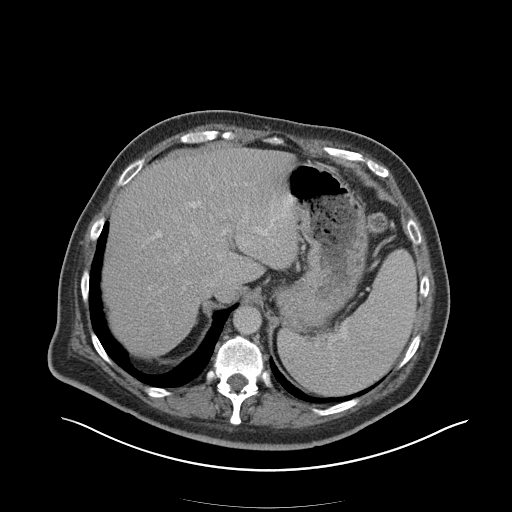
[im 113/122  soft-tissue]
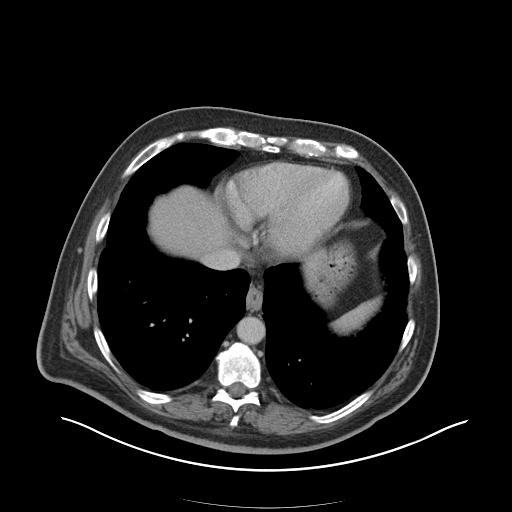

[Series 4: coronal st · coronal · 0.93mm/px · 3 of 152 slices shown]
[im 51/152  soft-tissue]
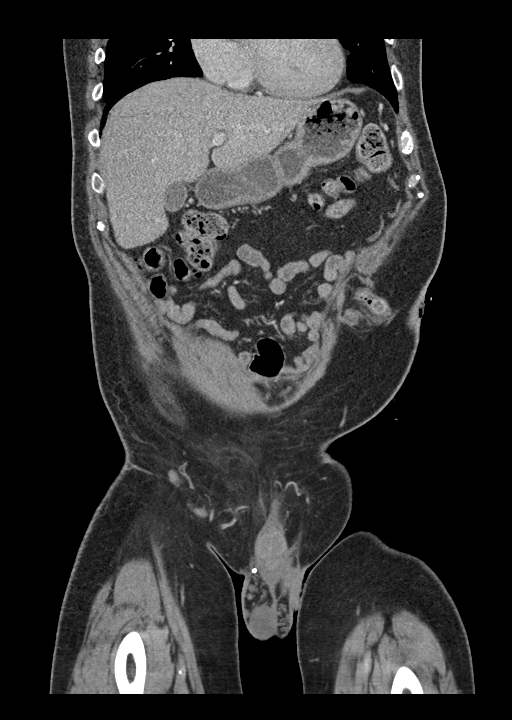
[im 68/152  soft-tissue]
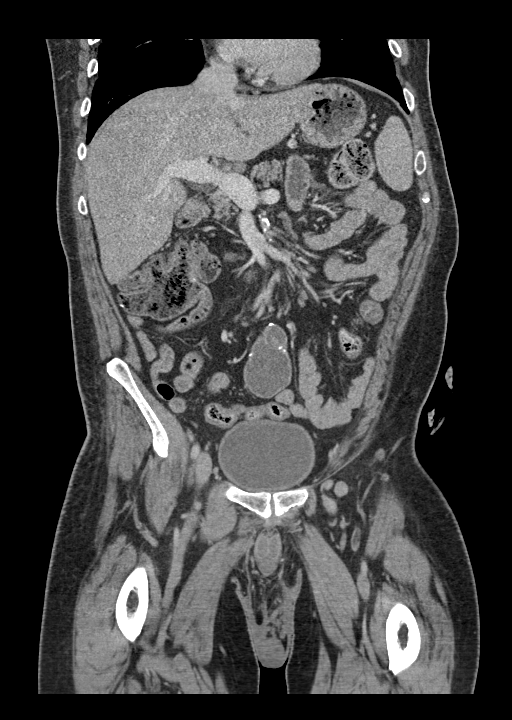
[im 84/152  soft-tissue]
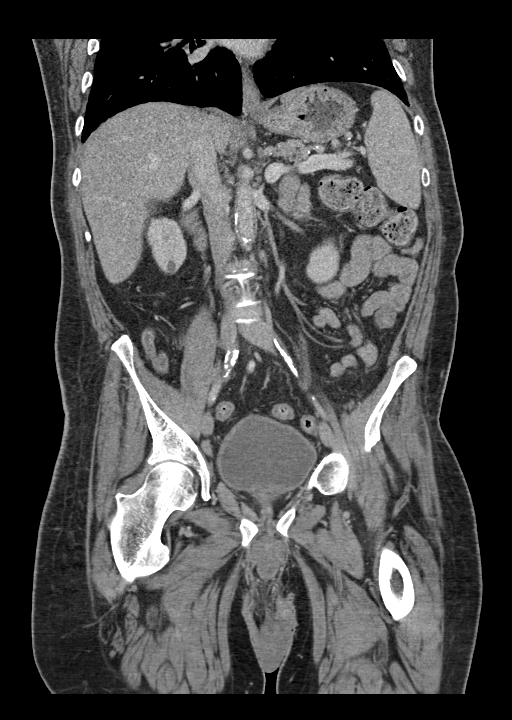

[14 of 46 positions shown; findings below may reference images not displayed]

RADIATION DOSE REDUCTION: This exam was performed according to the
departmental dose-optimization program which includes automated
exposure control, adjustment of the mA and/or kV according to
patient size and/or use of iterative reconstruction technique.

CONTRAST:  100mL OMNIPAQUE IOHEXOL 300 MG/ML  SOLN
FINDINGS: Lower chest: No acute abnormality.

Hepatobiliary: No suspicious hepatic lesion. Subcentimeter hepatic
cysts. Cholelithiasis without findings of acute cholecystitis. No
biliary ductal dilation.

Pancreas: Pancreatic atrophy. No pancreatic ductal dilation or
evidence of acute inflammation.

Spleen: Mild splenomegaly measuring 16.1 cm in maximum axial
dimension is unchanged from prior.

Adrenals/Urinary Tract: Bilateral adrenal glands appear normal. No
hydronephrosis. Kidneys demonstrate symmetric enhancement and
excretion of contrast material. Small right renal cysts are benign
and require no independent follow-up. Urinary bladder is
unremarkable for degree of distension.

Stomach/Bowel: No radiopaque enteric contrast material was
administered. Stomach is unremarkable for degree of distension. No
pathologic dilation of small or large bowel. Surgical changes of
partial right hemicolectomy with ileo colic anastomosis and
diverting loop colostomy in the left anterior abdominal wall.
Diastasis rectus with unchanged nonobstructed small bowel containing
ventral hernias. Prior small bowel resection with similar a
ganglionic section at the anastomotic site.

Vascular/Lymphatic: Aortic and branch vessel atherosclerosis without
abdominal aortic aneurysm. Chronic occlusion of the left common
iliac artery is unchanged from prior. Dilated hemorrhoidal vessels
are unchanged from prior. No pathologically enlarged abdominal or
pelvic lymph nodes.

Reproductive: Prostate is unremarkable.

Other: Near complete resolution of the stranding adjacent to the
right pelvic sidewall and iliac vasculature. Skin thickening along
the left greater than right scrotal crease for instance on image
109/2 extending into the gluteal crease on image 105/2 without
walled-off fluid collection, subcutaneous gas or significant edema.

Musculoskeletal: No acute osseous abnormality. Metallic fragments in
the right posterior subcutaneous tissues unchanged.
IMPRESSION: 1. Skin thickening along the left greater than right scrotal crease
extending into the gluteal crease without walled-off fluid
collection, subcutaneous gas or significant edema, may reflect
cellulitis recommend correlation with direct visualization.
2. Near complete resolution of the stranding adjacent to the right
pelvic sidewall and iliac vasculature.
3. Unchanged appearance of nonobstructed small bowel containing
ventral hernias.
4. Cholelithiasis without findings of acute cholecystitis.
5.  Aortic Atherosclerosis ([id number]).

## 2024-01-27 ENCOUNTER — Other Ambulatory Visit (HOSPITAL_COMMUNITY): Payer: Medicare Other

## 2024-01-31 ENCOUNTER — Ambulatory Visit (HOSPITAL_COMMUNITY)
Admission: RE | Admit: 2024-01-31 | Discharge: 2024-01-31 | Disposition: A | Discharge status: Home / Self Care | Source: Ambulatory Visit | Attending: Internal Medicine | Admitting: Internal Medicine

## 2024-01-31 ENCOUNTER — Encounter: Payer: Self-pay | Admitting: *Deleted

## 2024-01-31 DIAGNOSIS — E041 Nontoxic single thyroid nodule: Secondary | ICD-10-CM | POA: Diagnosis present

## 2024-02-02 ENCOUNTER — Ambulatory Visit: Payer: Medicare Other | Admitting: Internal Medicine

## 2024-02-06 ENCOUNTER — Ambulatory Visit (INDEPENDENT_AMBULATORY_CARE_PROVIDER_SITE_OTHER)

## 2024-02-06 DIAGNOSIS — I739 Peripheral vascular disease, unspecified: Secondary | ICD-10-CM

## 2024-02-06 DIAGNOSIS — M2141 Flat foot [pes planus] (acquired), right foot: Secondary | ICD-10-CM

## 2024-02-06 DIAGNOSIS — L97516 Non-pressure chronic ulcer of other part of right foot with bone involvement without evidence of necrosis: Secondary | ICD-10-CM

## 2024-02-06 DIAGNOSIS — E11628 Type 2 diabetes mellitus with other skin complications: Secondary | ICD-10-CM | POA: Diagnosis not present

## 2024-02-06 DIAGNOSIS — M2142 Flat foot [pes planus] (acquired), left foot: Secondary | ICD-10-CM

## 2024-02-06 DIAGNOSIS — Z89431 Acquired absence of right foot: Secondary | ICD-10-CM | POA: Diagnosis not present

## 2024-02-06 NOTE — Progress Notes (Signed)
 Patient presents today to pick up diabetic shoes and insoles.  Patient was dispensed 1 pair of diabetic shoes and 3 ea custom left inserts and 1 ea right toe filler. Fit was satisfactory. Instructions for break-in and wear was reviewed and a copy was given to the patient.   Re-appointment for regularly scheduled diabetic foot care visits or if they should experience any trouble with the shoes or insoles.

## 2024-02-07 ENCOUNTER — Ambulatory Visit: Admitting: Student

## 2024-02-07 VITALS — BP 97/62 | HR 88 | Temp 98.5°F | Ht 71.0 in | Wt 230.0 lb

## 2024-02-07 DIAGNOSIS — Z7985 Long-term (current) use of injectable non-insulin antidiabetic drugs: Secondary | ICD-10-CM

## 2024-02-07 DIAGNOSIS — I10 Essential (primary) hypertension: Secondary | ICD-10-CM | POA: Diagnosis not present

## 2024-02-07 DIAGNOSIS — Z7984 Long term (current) use of oral hypoglycemic drugs: Secondary | ICD-10-CM

## 2024-02-07 DIAGNOSIS — E875 Hyperkalemia: Secondary | ICD-10-CM

## 2024-02-07 DIAGNOSIS — E1165 Type 2 diabetes mellitus with hyperglycemia: Secondary | ICD-10-CM

## 2024-02-07 DIAGNOSIS — E041 Nontoxic single thyroid nodule: Secondary | ICD-10-CM

## 2024-02-07 DIAGNOSIS — R42 Dizziness and giddiness: Secondary | ICD-10-CM

## 2024-02-07 DIAGNOSIS — Z794 Long term (current) use of insulin: Secondary | ICD-10-CM | POA: Diagnosis not present

## 2024-02-07 LAB — GLUCOSE, CAPILLARY: Glucose-Capillary: 184 mg/dL — ABNORMAL HIGH (ref 70–99)

## 2024-02-07 LAB — POCT GLYCOSYLATED HEMOGLOBIN (HGB A1C): Hemoglobin A1C: 8.3 % — AB (ref 4.0–5.6)

## 2024-02-07 MED ORDER — INSULIN DEGLUDEC 100 UNIT/ML ~~LOC~~ SOPN
50.0000 [IU] | PEN_INJECTOR | Freq: Every day | SUBCUTANEOUS | Status: AC
Start: 2024-02-07 — End: ?

## 2024-02-07 NOTE — Progress Notes (Unsigned)
 CC: Routine Follow Up for hypertension and diabetes after last office visit 01/05/2024  HPI:  Andres Richardson is a 58 y.o. male with pertinent PMH of HTN, HFpEF, CAD, T2DM with CKD stage IIIa and microalbuminuria, prior right foot TMA, and history of Fournier's gangrene s/p colostomy who presents as above. Please see assessment and plan below for further details.  Medications: Current Outpatient Medications  Medication Instructions   Acetaminophen  Extra Strength 1,000 mg, Oral, Every 6 hours PRN   aspirin  EC 81 mg, Oral, Daily, Swallow whole.   BD Pen Needle Nano U/F 1 Units, Does not apply, Daily at bedtime   Blood Glucose Monitoring Suppl DEVI May substitute to any manufacturer covered by patient's insurance.   calcium  carbonate (TUMS - DOSED IN MG ELEMENTAL CALCIUM ) 500 MG chewable tablet 1 tablet, Daily at bedtime   clopidogrel  (PLAVIX ) 75 mg, Oral, Daily   Continuous Glucose Sensor (DEXCOM G7 SENSOR) MISC USE AS DIRECTED ONCE A WEEK   docusate sodium  (COLACE) 100 mg, Oral, Every 12 hours   DULoxetine  (CYMBALTA ) 60 mg, Oral, Daily   ezetimibe  (ZETIA ) 10 mg, Oral, Daily   furosemide  (LASIX ) 40 mg, Oral, Daily   gabapentin  (NEURONTIN ) 300 mg, Oral, 3 times daily   gentamicin  ointment (GARAMYCIN ) 0.1 % 1 Application, Topical, Daily   Glucose Blood (BLOOD GLUCOSE TEST STRIPS) STRP 1 each, In Vitro, 3 times daily, May substitute to any manufacturer covered by [employer].   Gvoke HypoPen  1-Pack 1 mg, Subcutaneous, As needed   Homeopathic Products (LEG CRAMPS) TABS 3 tablets, Daily PRN   insulin  degludec (TRESIBA ) 50 Units, Subcutaneous, Daily at bedtime   Iron, Ferrous Sulfate , 325 (65 Fe) MG TABS 1 tablet, Daily   isosorbide  mononitrate (IMDUR ) 30 mg, Oral, Daily   Lancet Device MISC 1 each, Does not apply, 3 times daily, May substitute to any manufacturer covered by patient's insurance.   metFORMIN  (GLUCOPHAGE ) 1,000 mg, Oral, 2 times daily with meals   montelukast   (SINGULAIR ) 10 mg, Oral, Daily   Multiple Vitamin (MULTIVITAMIN WITH MINERALS) TABS tablet 1 tablet, Oral, Daily   nitroGLYCERIN  (NITROSTAT ) 0.4 mg, Sublingual, Every 5 min PRN   rosuvastatin  (CRESTOR ) 40 mg, Oral, Daily   TRULICITY  0.75 MG/0.5ML SOAJ INJECT 0.75 MG UNDER THE SKIN ONCE A WEEK     Review of Systems:   Pertinent items noted in HPI and/or A&P.  Physical Exam:  Vitals:   02/07/24 1535 02/07/24 1539  BP: 96/61 97/62  Pulse: 91 88  Temp: 98.5 F (36.9 C)   TempSrc: Oral   Weight: 230 lb (104.3 kg)   Height: 5' 11 (1.803 m)     Constitutional: Well-appearing elderly male. In no acute distress. HEENT: Normocephalic, atraumatic, Sclera non-icteric, PERRL, EOM intact Cardio:Regular rate and rhythm. 2+ bilateral radial pulses. Pulm:Clear to auscultation bilaterally. Normal work of breathing on room air. Neuro:Alert and oriented x3.  Psych:Pleasant mood and affect.   Assessment & Plan:   Essential hypertension Blood pressure today 97/62 which is slightly improved from last visit where he was either supposed to stop taking amlodipine  or olmesartan .  He is unsure which medications he definitely takes at home but olmesartan  is still on his medication list and amlodipine  is not.  He is having orthostatic symptoms at home but orthostatic vital signs here are normal without symptoms.  Due to significant polypharmacy and recent low blood pressures and several medication changes including the addition of finerenone by his nephrologist that is also not on our medication list  yet we will have him come back in the next 1 to 2 weeks for a full medication reconciliation and he should bring his medications in with him. - Stop olmesartan  and Lasix  until follow-up in the next 1-2 weeks - Reminder set to ensure patient has a follow-up in the next 2 weeks otherwise I will call the patient to set this up or go over medications over the phone  Uncontrolled type 2 diabetes mellitus with  hyperglycemia, with long-term current use of insulin  (HCC) A1c today improved from 11.1-8.3 over the past 2 months.  At his last visit he was started on Trulicity  0.75 mg weekly and his Tresiba  was decreased from either 65 or 70 units daily to 60 units daily.  He continues to have some lows in the morning with blood sugars as low as the 40s and several that are in the 70s.  No significant side effects after starting Trulicity .  Discussed titration schedule which he is confident that he can do.  Unfortunately he also lost coverage of his CGM but is using fingerstick glucose for now instead. - Decrease Tresiba  to 50 units nightly, titrate by 2 units every 3-4 days if consistently out of range 70-140 - Continue Trulicity  0.75 mg weekly - Continue metformin  1000 mg twice daily  Thyroid  nodule greater than or equal to 1.5 cm in diameter incidentally noted on imaging study Recent thyroid  ultrasound showed interval enlargement of the right middle lobe thyroid  nodule that was previously biopsied in August 2024 with results of benign follicular hyperplasia.  Right upper lobe thyroid  nodule has shown stability for over a year with recommendation of repeat thyroid  ultrasound in 1-2 years.  Last TSH from early 2024 was normal and he is not having any hypo or hyperthyroidism symptoms at this time.  Due to the enlarging nodule and per patient preference we will refer him to [facility] surgery to discuss with a thyroid  surgeon. - Referral to [facility] surgery  Hyperkalemia Hyperkalemia noted on BMP at previous visit 1 month ago without ability to contact the patient to get a follow-up BMP.  BMP today with resolved hyperkalemia but elevated creatinine at 1.9 from 1.6.  Suspect this is due to his recurrent low blood pressures and we have discontinued his antihypertensives and diuretics.  Recommend repeat BMP at his follow-up visit in 1-2 weeks.    Patient discussed with Dr. [place] [facility]  [name],  DO [facility] Internal [facility] Resident PGY-2 Clinic Phone: [phone] Please contact the on call pager at [phone] for any urgent or emergent needs.

## 2024-02-07 NOTE — Patient Instructions (Signed)
 Thank you, Mr.Brannon V Erpelding, for allowing us  to provide your care today. Today we discussed . . .  > Dizziness       - I would like you to stop taking the olmesartan  and when you come back in 1-2 weeks bring all of your medications including the medications that we have recently stopped like the olmesartan  so that we can go through what you are taking and what you are not taking and make sure lines up with our list.  After this we will be able to make good decisions on which medications you need and which ones you do not. > Diabetes       - I would like you to start taking 50 units of Tresiba  every day and continue your other medications as prescribed.  Please check your blood sugar in the morning before eating anything.  If this number is below 70 for 3 days in a row you can decrease the number of units of Tresiba  by 2.  If the number is above 140 for 3 days in a row you can increase the number of units of Tresiba  by 2.  Do not change your insulin  dosing more frequently than every 3-4 days.  If this becomes too complicated or you do not fully understand it just continue with 50 units daily and we will review your glucometer readings at your next visit. > Thyroid        - I will figure out whether or not you will need another thyroid  biopsy and when you come back for your next visit we will make sure that we talk about this plan.   I have ordered the following labs for you:   Lab Orders         BMP8+Anion Gap         Glucose, capillary         POC Hbg A1C        Follow up: 1-2 weeks for medication reconciliation     Remember:  Should you have any questions or concerns please call the internal medicine clinic at [phone].     [name], DO [facility]

## 2024-02-08 LAB — BMP8+ANION GAP
Anion Gap: 16 mmol/L (ref 10.0–18.0)
BUN/Creatinine Ratio: 17 (ref 9–20)
BUN: 33 mg/dL — ABNORMAL HIGH (ref 6–24)
CO2: 20 mmol/L (ref 20–29)
Calcium: 7.9 mg/dL — ABNORMAL LOW (ref 8.7–10.2)
Chloride: 106 mmol/L (ref 96–106)
Creatinine, Ser: 1.9 mg/dL — ABNORMAL HIGH (ref 0.76–1.27)
Glucose: 162 mg/dL — ABNORMAL HIGH (ref 70–99)
Potassium: 4.5 mmol/L (ref 3.5–5.2)
Sodium: 142 mmol/L (ref 134–144)
eGFR: 40 mL/min/{1.73_m2} — ABNORMAL LOW (ref >59)

## 2024-02-08 NOTE — Assessment & Plan Note (Signed)
 Recent thyroid  ultrasound showed interval enlargement of the right middle lobe thyroid  nodule that was previously biopsied in August 2024 with results of benign follicular hyperplasia.  Right upper lobe thyroid  nodule has shown stability for over a year with recommendation of repeat thyroid  ultrasound in 1-2 years.  Last TSH from early 2024 was normal and he is not having any hypo or hyperthyroidism symptoms at this time.  Due to the enlarging nodule and per patient preference we will refer him to [facility] surgery to discuss with a thyroid  surgeon. - Referral to [facility] surgery

## 2024-02-08 NOTE — Assessment & Plan Note (Signed)
 A1c today improved from 11.1-8.3 over the past 2 months.  At his last visit he was started on Trulicity  0.75 mg weekly and his Tresiba  was decreased from either 65 or 70 units daily to 60 units daily.  He continues to have some lows in the morning with blood sugars as low as the 40s and several that are in the 70s.  No significant side effects after starting Trulicity .  Discussed titration schedule which he is confident that he can do.  Unfortunately he also lost coverage of his CGM but is using fingerstick glucose for now instead. - Decrease Tresiba  to 50 units nightly, titrate by 2 units every 3-4 days if consistently out of range 70-140 - Continue Trulicity  0.75 mg weekly - Continue metformin  1000 mg twice daily

## 2024-02-08 NOTE — Assessment & Plan Note (Signed)
 Hyperkalemia noted on BMP at previous visit 1 month ago without ability to contact the patient to get a follow-up BMP.  BMP today with resolved hyperkalemia but elevated creatinine at 1.9 from 1.6.  Suspect this is due to his recurrent low blood pressures and we have discontinued his antihypertensives and diuretics.  Recommend repeat BMP at his follow-up visit in 1-2 weeks.

## 2024-02-08 NOTE — Assessment & Plan Note (Addendum)
 Blood pressure today 97/62 which is slightly improved from last visit where he was either supposed to stop taking amlodipine  or olmesartan .  He is unsure which medications he definitely takes at home but olmesartan  is still on his medication list and amlodipine  is not.  He is having orthostatic symptoms at home but orthostatic vital signs here are normal without symptoms.  Due to significant polypharmacy and recent low blood pressures and several medication changes including the addition of finerenone by his nephrologist that is also not on our medication list yet we will have him come back in the next 1 to 2 weeks for a full medication reconciliation and he should bring his medications in with him. - Stop olmesartan  and Lasix  until follow-up in the next 1-2 weeks - Reminder set to ensure patient has a follow-up in the next 2 weeks otherwise I will call the patient to set this up or go over medications over the phone

## 2024-02-14 NOTE — Progress Notes (Signed)
 Internal Medicine Clinic Attending  Case discussed with the resident at the time of the visit.  We reviewed the resident's history and exam and pertinent patient test results.  I agree with the assessment, diagnosis, and plan of care documented in the resident's note.
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
## 2024-02-22 ENCOUNTER — Ambulatory Visit: Payer: Self-pay

## 2024-02-22 ENCOUNTER — Encounter: Payer: Self-pay | Admitting: Student

## 2024-02-22 ENCOUNTER — Ambulatory Visit: Payer: Self-pay | Attending: Cardiovascular Disease | Admitting: Student

## 2024-02-22 VITALS — BP 110/50 | HR 91 | Ht 71.0 in | Wt 231.0 lb

## 2024-02-22 DIAGNOSIS — I5032 Chronic diastolic (congestive) heart failure: Secondary | ICD-10-CM

## 2024-02-22 DIAGNOSIS — Z01818 Encounter for other preprocedural examination: Secondary | ICD-10-CM

## 2024-02-22 DIAGNOSIS — I10 Essential (primary) hypertension: Secondary | ICD-10-CM

## 2024-02-22 DIAGNOSIS — I2510 Atherosclerotic heart disease of native coronary artery without angina pectoris: Secondary | ICD-10-CM | POA: Diagnosis not present

## 2024-02-22 DIAGNOSIS — I6523 Occlusion and stenosis of bilateral carotid arteries: Secondary | ICD-10-CM

## 2024-02-22 DIAGNOSIS — E118 Type 2 diabetes mellitus with unspecified complications: Secondary | ICD-10-CM

## 2024-02-22 DIAGNOSIS — Z794 Long term (current) use of insulin: Secondary | ICD-10-CM

## 2024-02-22 DIAGNOSIS — E785 Hyperlipidemia, unspecified: Secondary | ICD-10-CM

## 2024-02-22 DIAGNOSIS — I739 Peripheral vascular disease, unspecified: Secondary | ICD-10-CM

## 2024-02-22 DIAGNOSIS — N1831 Chronic kidney disease, stage 3a: Secondary | ICD-10-CM

## 2024-02-22 NOTE — Patient Instructions (Addendum)
 Heart Failure Education: Weigh yourself EVERY morning after you go to the bathroom but before you eat or drink anything. Write this number down in a weight log/diary. If you gain 3 pounds overnight or 5 pounds in a week, call the office. Take your medicines as prescribed. If you have concerns about your medications, please call us  before you stop taking them.  Eat low salt foods--Limit salt (sodium) to 2000 mg per day. This will help prevent your body from holding onto fluid. Read food labels as many processed foods have a lot of sodium, especially canned goods and prepackaged meats. If you would like some assistance choosing low sodium foods, we would be happy to set you up with a nutritionist. Limit all fluids for the day to less than 2 liters (64 ounces). Fluid includes all drinks, coffee, juice, ice chips, soup, jello, and all other liquids. Stay as active as you can everyday. Staying active will give you more energy and make your muscles stronger. Start with 5 minutes at a time and work your way up to 30 minutes a day. Break up your activities--do some in the morning and some in the afternoon. Start with 3 days per week and work your way up to 5 days as you can.  If you have chest pain, feel short of breath, dizzy, or lightheaded, STOP. If you don't feel better after a short rest, call 911. If you do feel better, call the office to let us  know you have symptoms with exercise.   Medication Instructions:  No medication changes were made during today's visit.  *If you need a refill on your cardiac medications before your next appointment, please call your pharmacy*   Lab Work: No labs were ordered during today's visit.  If you have labs (blood work) drawn today and your tests are completely normal, you will receive your results only by: MyChart Message (if you have MyChart) OR A paper copy in the mail If you have any lab test that is abnormal or we need to change your treatment, we will call you  to review the results.   Testing/Procedures: No procedures were ordered during today's visit.    Follow-Up: At [facility], you and your health needs are our priority.  As part of our continuing mission to provide you with exceptional heart care, we have created designated Provider Care Teams.  These Care Teams include your primary Cardiologist (physician) and Advanced Practice Providers (APPs -  Physician Assistants and Nurse Practitioners) who all work together to provide you with the care you need, when you need it.  We recommend signing up for the patient portal called MyChart.  Sign up information is provided on this After Visit Summary.  MyChart is used to connect with patients for Virtual Visits (Telemedicine).  Patients are able to view lab/test results, encounter notes, upcoming appointments, etc.  Non-urgent messages can be sent to your provider as well.   To learn more about what you can do with MyChart, go to [email or web address].    Your next appointment:   6 month(s)  Provider:   Gayatri A Acharya, MD   Other Instructions Thank you for choosing [facility]!

## 2024-02-27 ENCOUNTER — Ambulatory Visit: Payer: Self-pay | Admitting: Student

## 2024-02-27 NOTE — Telephone Encounter (Signed)
 Patient scheduled for 9/9 at 3

## 2024-03-01 ENCOUNTER — Other Ambulatory Visit: Payer: Self-pay

## 2024-03-01 ENCOUNTER — Ambulatory Visit: Admitting: Student

## 2024-03-01 VITALS — BP 117/70 | HR 84 | Temp 98.3°F | Ht 71.0 in | Wt 228.2 lb

## 2024-03-01 DIAGNOSIS — I5032 Chronic diastolic (congestive) heart failure: Secondary | ICD-10-CM

## 2024-03-01 DIAGNOSIS — I10 Essential (primary) hypertension: Secondary | ICD-10-CM

## 2024-03-01 DIAGNOSIS — I739 Peripheral vascular disease, unspecified: Secondary | ICD-10-CM

## 2024-03-01 DIAGNOSIS — E1129 Type 2 diabetes mellitus with other diabetic kidney complication: Secondary | ICD-10-CM

## 2024-03-01 DIAGNOSIS — I5033 Acute on chronic diastolic (congestive) heart failure: Secondary | ICD-10-CM

## 2024-03-01 DIAGNOSIS — Z794 Long term (current) use of insulin: Secondary | ICD-10-CM | POA: Diagnosis not present

## 2024-03-01 DIAGNOSIS — E7800 Pure hypercholesterolemia, unspecified: Secondary | ICD-10-CM

## 2024-03-01 DIAGNOSIS — E0821 Diabetes mellitus due to underlying condition with diabetic nephropathy: Secondary | ICD-10-CM

## 2024-03-01 DIAGNOSIS — I110 Hypertensive heart disease with heart failure: Secondary | ICD-10-CM

## 2024-03-01 DIAGNOSIS — R809 Proteinuria, unspecified: Secondary | ICD-10-CM

## 2024-03-01 DIAGNOSIS — E119 Type 2 diabetes mellitus without complications: Secondary | ICD-10-CM

## 2024-03-01 DIAGNOSIS — E1165 Type 2 diabetes mellitus with hyperglycemia: Secondary | ICD-10-CM | POA: Diagnosis not present

## 2024-03-01 DIAGNOSIS — I25119 Atherosclerotic heart disease of native coronary artery with unspecified angina pectoris: Secondary | ICD-10-CM

## 2024-03-01 DIAGNOSIS — G4733 Obstructive sleep apnea (adult) (pediatric): Secondary | ICD-10-CM

## 2024-03-01 DIAGNOSIS — Z7984 Long term (current) use of oral hypoglycemic drugs: Secondary | ICD-10-CM

## 2024-03-01 NOTE — Assessment & Plan Note (Signed)
 Stopped taking Trulicity , but continuing to take tresiba  60U daily. Sugars at home are in the 160-180 range. Will continue with just insulin  and metformin , not due for A1c today but last one was 8.3 in 02/07/2024

## 2024-03-01 NOTE — Patient Instructions (Signed)
 Thank you so much for coming to the clinic today!   We are checking some labs today to make sure your potassium and kidney function  I will call you with the results  We will see you back in about a month    If you have any questions please feel free to the call the clinic at anytime at [phone]. It was a pleasure seeing you!  Best, Dr. Lianette Broussard

## 2024-03-01 NOTE — Assessment & Plan Note (Signed)
 Continuing aspirin , plavix , and statin

## 2024-03-02 LAB — BASIC METABOLIC PANEL WITH GFR
BUN/Creatinine Ratio: 15 (ref 9–20)
BUN: 24 mg/dL (ref 6–24)
CO2: 20 mmol/L (ref 20–29)
Calcium: 8 mg/dL — ABNORMAL LOW (ref 8.7–10.2)
Chloride: 105 mmol/L (ref 96–106)
Creatinine, Ser: 1.58 mg/dL — ABNORMAL HIGH (ref 0.76–1.27)
Glucose: 385 mg/dL — ABNORMAL HIGH (ref 70–99)
Potassium: 4.1 mmol/L (ref 3.5–5.2)
Sodium: 138 mmol/L (ref 134–144)
eGFR: 50 mL/min/1.73 — ABNORMAL LOW (ref >59)

## 2024-03-02 NOTE — Assessment & Plan Note (Signed)
 Started.

## 2024-03-02 NOTE — Assessment & Plan Note (Addendum)
 Doesn't appear volume overloaded on my exam, continuing his Lasix  40mg . Recently saw cardiology and they wanted to continue him on this.

## 2024-03-02 NOTE — Progress Notes (Signed)
 CC: Medication Reconciliation  HPI:  Andres Richardson is a 58 y.o. male living with a history stated below and presents today for medication reconciliation. Please see problem based assessment and plan for additional details.  Past Medical History:  Diagnosis Date   CAD (coronary artery disease) 2022     Prox RCA lesion is 100% stenosed.   Prox Cx to Mid Cx lesion is 60% stenosed.   LPAV lesion is 60% stenosed.   Ost LAD to Prox LAD lesion is 40% stenosed.   Diabetes mellitus without complication (HCC) 1995   Type !! 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 Diabetic neuropathy.   Fournier gangrene 2022   Grade I diastolic dysfunction 12/02/2021   Grade I diastolic dysfunction 12/02/2021   History of creation of ostomy (HCC)    Hyperlipidemia    Mixed   Hypertension    Essential   Obesity    Peripheral arterial disease (HCC)    occluded left common iliac artery   Proteinuria    Renal disorder    Thyroid  disease    Tobacco use disorder     Current Outpatient Medications on File Prior to Visit  Medication Sig Dispense Refill   acetaminophen  (TYLENOL ) 500 MG tablet Take 2 tablets (1,000 mg total) by mouth every 6 (six) hours as needed for moderate pain (pain score 4-6). 30 tablet 0   amLODipine  (NORVASC ) 5 MG tablet Take 5 mg by mouth every morning.     aspirin  EC 81 MG tablet Take 1 tablet (81 mg total) by mouth daily. Swallow whole. 90 tablet 3   Blood Glucose Monitoring Suppl DEVI May substitute to any manufacturer covered by patient's insurance. 1 each 0   calcium  carbonate (TUMS - DOSED IN MG ELEMENTAL CALCIUM ) 500 MG chewable tablet Chew 1 tablet by mouth at bedtime.     clopidogrel  (PLAVIX ) 75 MG tablet Take 1 tablet (75 mg total) by mouth daily. 30 tablet 6   Continuous Glucose Sensor (DEXCOM G7 SENSOR) MISC USE AS DIRECTED ONCE A WEEK 3 each 0   docusate sodium  (COLACE) 100 MG capsule Take 1 capsule (100 mg total) by mouth every 12 (twelve) hours. 60 capsule 0    DULoxetine  (CYMBALTA ) 60 MG capsule Take 1 capsule (60 mg total) by mouth daily. 90 capsule 3   ezetimibe  (ZETIA ) 10 MG tablet Take 1 tablet (10 mg total) by mouth daily. 30 tablet 0   furosemide  (LASIX ) 40 MG tablet Take 1 tablet by mouth once daily 90 tablet 0   gabapentin  (NEURONTIN ) 100 MG capsule Take 3 capsules (300 mg total) by mouth 3 (three) times daily. 540 capsule 5   gentamicin  ointment (GARAMYCIN ) 0.1 % Apply 1 Application topically daily. 30 g 3   Glucagon  (GVOKE HYPOPEN  1-PACK) 1 MG/0.2ML SOAJ Inject 1 mg into the skin as needed (low blood sugar with impaired consciousness). 0.4 mL 2   Glucose Blood (BLOOD GLUCOSE TEST STRIPS) STRP 1 each by In Vitro route in the morning, at noon, and at bedtime. May substitute to any manufacturer covered by patient's insurance. 300 strip 3   Homeopathic Products (LEG CRAMPS) TABS Take 3 tablets by mouth daily as needed (leg cramps).     insulin  degludec (TRESIBA ) 100 UNIT/ML FlexTouch Pen Inject 50 Units into the skin at bedtime.     Insulin  Pen Needle (BD PEN NEEDLE NANO U/F) 32G X 4 MM MISC 1 Units by Does not apply route at bedtime. 90 each 3   isosorbide  mononitrate (IMDUR ) 30  MG 24 hr tablet Take 1 tablet by mouth once daily 90 tablet 3   KERENDIA 10 MG TABS Take 1 tablet by mouth every morning.     Lancet Device MISC 1 each by Does not apply route in the morning, at noon, and at bedtime. May substitute to any manufacturer covered by patient's insurance. 1 each 0   metFORMIN  (GLUCOPHAGE ) 1000 MG tablet Take 1 tablet (1,000 mg total) by mouth 2 (two) times daily with a meal. 180 tablet 1   nitroGLYCERIN  (NITROSTAT ) 0.4 MG SL tablet Place 1 tablet (0.4 mg total) under the tongue every 5 (five) minutes as needed for chest pain. 20 tablet 3   rosuvastatin  (CRESTOR ) 40 MG tablet Take 1 tablet (40 mg total) by mouth daily. 90 tablet 3   No current facility-administered medications on file prior to visit.    Family History  Problem Relation Age  of Onset   CAD Mother    Colon cancer Father     Social History   Socioeconomic History   Marital status: Single    Spouse name: Not on file   Number of children: Not on file   Years of education: Not on file   Highest education level: Not on file  Occupational History   Not on file  Tobacco Use   Smoking status: Every Day    Current packs/day: 0.25    Types: Cigarettes   Smokeless tobacco: Never   Tobacco comments:    5 cigs per day/Uses Nicorette patch  Vaping Use   Vaping status: Never Used  Substance and Sexual Activity   Alcohol use: Never   Drug use: Never   Sexual activity: Not on file  Other Topics Concern   Not on file  Social History Narrative   Not on file   Social Drivers of Health   Financial Resource Strain: Low Risk  (09/12/2023)   Overall Financial Resource Strain (CARDIA)    Difficulty of Paying Living Expenses: Not hard at all  Food Insecurity: No Food Insecurity (09/12/2023)   Hunger Vital Sign    Worried About Running Out of Food in the Last Year: Never true    Ran Out of Food in the Last Year: Never true  Transportation Needs: No Transportation Needs (09/12/2023)   PRAPARE - Administrator, Civil Service (Medical): No    Lack of Transportation (Non-Medical): No  Physical Activity: Insufficiently Active (02/16/2023)   Exercise Vital Sign    Days of Exercise per Week: 3 days    Minutes of Exercise per Session: 20 min  Stress: No Stress Concern Present (09/12/2023)   [employer] of Occupational Health - Occupational Stress Questionnaire    Feeling of Stress : Not at all  Social Connections: Moderately Isolated (09/12/2023)   Social Connection and Isolation Panel    Frequency of Communication with Friends and Family: More than three times a week    Frequency of Social Gatherings with Friends and Family: Three times a week    Attends Religious Services: More than 4 times per year    Active Member of Clubs or Organizations: No     Attends Banker Meetings: Never    Marital Status: Divorced  Catering manager Violence: Not At Risk (09/12/2023)   Humiliation, Afraid, Rape, and Kick questionnaire    Fear of Current or Ex-Partner: No    Emotionally Abused: No    Physically Abused: No    Sexually Abused: No    Review of  Systems: ROS negative except for what is noted on the assessment and plan.  Vitals:   03/01/24 1505  BP: 117/70  Pulse: 84  Temp: 98.3 F (36.8 C)  TempSrc: Oral  SpO2: 96%  Weight: 228 lb 3.2 oz (103.5 kg)  Height: 5' 11 (1.803 m)    Physical Exam: Constitutional: well-appearing male  in no acute distress HENT: normocephalic atraumatic, mucous membranes moist Eyes: conjunctiva non-erythematous Neck: supple Cardiovascular: regular rate and rhythm, no m/r/g Pulmonary/Chest: normal work of breathing on room air, lungs clear to auscultation bilaterally Abdominal: soft, non-tender, non-distended   Assessment & Plan:   Uncontrolled type 2 diabetes mellitus with hyperglycemia, with long-term current use of insulin  (HCC) Stopped taking Trulicity , but continuing to take tresiba  60U daily. Sugars at home are in the 160-180 range. Will continue with just insulin  and metformin , not due for A1c today but last one was 8.3 in 02/07/2024  PAD (peripheral artery disease) (HCC) Continuing aspirin , Imdur ,  plavix , and statin  (HFpEF) heart failure with preserved ejection fraction (HCC) Doesn't appear volume overloaded on my exam, continuing his Lasix  40mg . Recently saw cardiology and they wanted to continue him on this.   Microalbuminuria due to type 2 diabetes mellitus (HCC) Started  Essential hypertension Blood pressure 117/70, and current regimen is Olmesartan  and Lasix . At hsi last visit this was stopped due to hypotension, but then restarted by his cardiologist office. He does not take his blood pressure at home, but doesn't have orthostatic symptoms at home. Given his concurrent  HFpEF, I think he would benefit from the olmesartan , which he did take before coming into the clinic. Will continue and encouraged patient to obtain a blood pressure cuff. Checking BMP today with recent changes with his Lasix  and Olmesartan .  Patient discussed with Dr. Narendra  Joann Kulpa, M.D. [facility] Internal Medicine, PGY-3 Pager: [phone] Date 03/03/2024 Time 1:29 PM

## 2024-03-03 NOTE — Assessment & Plan Note (Addendum)
 Blood pressure 117/70, and current regimen is Olmesartan  and Lasix . At hsi last visit this was stopped due to hypotension, but then restarted by his cardiologist office. He does not take his blood pressure at home, but doesn't have orthostatic symptoms at home. Given his concurrent HFpEF, I think he would benefit from the olmesartan , which he did take before coming into the clinic. Will continue and encouraged patient to obtain a blood pressure cuff. Checking BMP today with recent changes with his Lasix  and Olmesartan .

## 2024-03-04 NOTE — Progress Notes (Signed)
 Internal Medicine Clinic Attending  Case discussed with the resident at the time of the visit.  We reviewed the resident's history and exam and pertinent patient test results.  I agree with the assessment, diagnosis, and plan of care documented in the resident's note.

## 2024-03-05 ENCOUNTER — Ambulatory Visit: Payer: Self-pay | Admitting: Student

## 2024-03-13 ENCOUNTER — Other Ambulatory Visit (HOSPITAL_COMMUNITY): Payer: Self-pay

## 2024-04-02 ENCOUNTER — Encounter: Payer: Self-pay | Admitting: Student

## 2024-04-02 ENCOUNTER — Ambulatory Visit: Admitting: Student

## 2024-04-02 VITALS — BP 114/74 | HR 93 | Temp 98.0°F | Ht 71.0 in | Wt 227.0 lb

## 2024-04-02 DIAGNOSIS — E1142 Type 2 diabetes mellitus with diabetic polyneuropathy: Secondary | ICD-10-CM | POA: Diagnosis not present

## 2024-04-02 DIAGNOSIS — I10 Essential (primary) hypertension: Secondary | ICD-10-CM | POA: Diagnosis not present

## 2024-04-02 DIAGNOSIS — E1165 Type 2 diabetes mellitus with hyperglycemia: Secondary | ICD-10-CM

## 2024-04-02 DIAGNOSIS — Z794 Long term (current) use of insulin: Secondary | ICD-10-CM

## 2024-04-02 DIAGNOSIS — F17210 Nicotine dependence, cigarettes, uncomplicated: Secondary | ICD-10-CM | POA: Diagnosis not present

## 2024-04-02 DIAGNOSIS — F17200 Nicotine dependence, unspecified, uncomplicated: Secondary | ICD-10-CM

## 2024-04-02 DIAGNOSIS — E785 Hyperlipidemia, unspecified: Secondary | ICD-10-CM

## 2024-04-02 MED ORDER — DULOXETINE HCL 60 MG PO CPEP: 60.0000 mg | ORAL_CAPSULE | Freq: Every day | ORAL | Status: DC

## 2024-04-02 MED ORDER — EZETIMIBE 10 MG PO TABS: 10.0000 mg | ORAL_TABLET | Freq: Every day | ORAL | Status: DC

## 2024-04-02 MED ORDER — DEXCOM G7 SENSOR MISC: 11 refills | Status: DC

## 2024-04-02 NOTE — Patient Instructions (Signed)
 Remember to bring all of the medications that you take (including over the counter medications and supplements) with you to every clinic visit.  This after visit summary is an important review of tests, referrals, and medication changes that were discussed during your visit. If you have questions or concerns, call [phone]. Outside of clinic business hours, call the main hospital at [phone] and ask the operator for the on-call internal medicine resident.   Ozell Kung MD 04/02/2024, 4:07 PM

## 2024-04-02 NOTE — Assessment & Plan Note (Signed)
 Chronic, at goal.  BP Readings from Last 3 Encounters:  04/02/24 114/74  03/01/24 117/70  02/22/24 (!) 110/50   Continue amlodipine  5 mg, imdur  30 mg daily.

## 2024-04-02 NOTE — Assessment & Plan Note (Signed)
 Chronic, stable. Bilateral hands and feet affected. History of right foot TMA. Insensate halfway up shin on LLE. Interosseous muscle wasting in both hands. Refilled cymbalta .

## 2024-04-02 NOTE — Assessment & Plan Note (Signed)
 Chronic, not at goal.  Lab Results  Component Value Date   HGBA1C 8.3 (A) 02/07/2024   HGBA1C 11.1 (A) 12/08/2023   HGBA1C 8.9 (A) 09/12/2023   No new data today. Note hyperglycemia to 385 at last visit. Continue insulin  degludec 50 units daily. GLP-1 tried and discontinued due to anorexia. SGLT-2 inappropriate due to history of Fournier's gangrene. Glipizide  discontinued due to unspecified intolerance. CGM data interpretation and A1c in 1 month. Next step is mealtime insulin .

## 2024-04-02 NOTE — Assessment & Plan Note (Signed)
 Prior quit attempts: yes, cut down significantly Triggers and coping strategies: enjoys a cigarette after a meal Planned quit date: not ready to quit Pharmacotherapy: not ready to quit 3 minutes spent on counseling.  He's down to 4-5 cigarettes/day from a habit of 2.5 packs/day.

## 2024-04-02 NOTE — Progress Notes (Signed)
 Patient name: Andres Richardson Date of birth: 1966-05-26 Date of visit: 04/02/24  Subjective   Reason for visit: diabetes with hyperglycemia  Doing okay, no new concerns today. No hypoglycemia, although he notes he has been without CGM for a while. He was getting it through manufacturer but they have stopped the assistance program he was using.  Current Outpatient Medications  Medication Instructions   Acetaminophen  Extra Strength 1,000 mg, Oral, Every 6 hours PRN   amLODipine  (NORVASC ) 5 mg, Every morning   aspirin  EC 81 mg, Oral, Daily, Swallow whole.   BD Pen Needle Nano U/F 1 Units, Does not apply, Daily at bedtime   Blood Glucose Monitoring Suppl DEVI May substitute to any manufacturer covered by patient's insurance.   calcium  carbonate (TUMS - DOSED IN MG ELEMENTAL CALCIUM ) 500 MG chewable tablet 1 tablet, Daily at bedtime   clopidogrel  (PLAVIX ) 75 mg, Oral, Daily   Continuous Glucose Sensor (DEXCOM G7 SENSOR) MISC USE AS DIRECTED ONCE A WEEK   docusate sodium  (COLACE) 100 mg, Oral, Every 12 hours   DULoxetine  (CYMBALTA ) 60 mg, Oral, Daily   ezetimibe  (ZETIA ) 10 mg, Oral, Daily   furosemide  (LASIX ) 40 mg, Oral, Daily   gabapentin  (NEURONTIN ) 300 mg, Oral, 3 times daily   gentamicin  ointment (GARAMYCIN ) 0.1 % 1 Application, Topical, Daily   Glucose Blood (BLOOD GLUCOSE TEST STRIPS) STRP 1 each, In Vitro, 3 times daily, May substitute to any manufacturer covered by [employer].   Gvoke HypoPen  1-Pack 1 mg, Subcutaneous, As needed   Homeopathic Products (LEG CRAMPS) TABS 3 tablets, Daily PRN   insulin  degludec (TRESIBA ) 50 Units, Subcutaneous, Daily at bedtime   isosorbide  mononitrate (IMDUR ) 30 mg, Oral, Daily   KERENDIA 10 MG TABS 1 tablet, Every morning   Lancet Device MISC 1 each, Does not apply, 3 times daily, May substitute to any manufacturer covered by patient's insurance.   metFORMIN  (GLUCOPHAGE ) 1,000 mg, Oral, 2 times daily with meals   nitroGLYCERIN   (NITROSTAT ) 0.4 mg, Sublingual, Every 5 min PRN   rosuvastatin  (CRESTOR ) 40 mg, Oral, Daily     Objective  Today's Vitals   04/02/24 1457  BP: 114/74  Pulse: 93  Temp: 98 F (36.7 C)  TempSrc: Oral  SpO2: 97%  Weight: 227 lb (103 kg)  Height: 5' 11 (1.803 m)  Body mass index is 31.66 kg/m.   Physical Exam Constitutional:      Appearance: Normal appearance.  Cardiovascular:     Rate and Rhythm: Normal rate and regular rhythm.  Pulmonary:     Effort: Pulmonary effort is normal. No respiratory distress.  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  Neurological:     Mental Status: He is alert.     Cranial Nerves: No facial asymmetry.  Psychiatric:        Mood and Affect: Affect normal.        Speech: Speech normal.        Behavior: Behavior normal.    Diabetic Foot Exam - Simple   Simple Foot Form Diabetic Foot exam was performed with the following findings: Yes 04/02/2024  5:29 PM  Visual Inspection See comments: Yes Sensation Testing See comments: Yes Pulse Check See comments: Yes Comments Right transmetatarsal amputation Non-palpable pulses bilaterally Insensate to monofilament on left halfway up shin       Assessment & Plan   Uncontrolled type 2 diabetes mellitus with  hyperglycemia, with long-term current use of insulin  [facility]) Assessment & Plan: Chronic, not at goal.  Lab Results  Component Value Date   HGBA1C 8.3 (A) 02/07/2024   HGBA1C 11.1 (A) 12/08/2023   HGBA1C 8.9 (A) 09/12/2023   No new data today. Note hyperglycemia to 385 at last visit. Continue insulin  degludec 50 units daily. GLP-1 tried and discontinued due to anorexia. SGLT-2 inappropriate due to history of Fournier's gangrene. Glipizide  discontinued due to unspecified intolerance. CGM data interpretation and A1c in 1 month. Next step is mealtime insulin .  Orders: -     Dexcom G7 Sensor; Apply to the skin  to monitor the blood glucose, replace every 10 days.  Dispense: 3 each; Refill: 11 -     Ambulatory referral to Ophthalmology  Diabetic polyneuropathy associated with type 2 diabetes mellitus (HCC) Assessment & Plan: Chronic, stable. Bilateral hands and feet affected. History of right foot TMA. Insensate halfway up shin on LLE. Interosseous muscle wasting in both hands. Refilled cymbalta .  Orders: -     DULoxetine  HCl; Take 1 capsule (60 mg total) by mouth daily.  Tobacco use disorder Assessment & Plan: Prior quit attempts: yes, cut down significantly Triggers and coping strategies: enjoys a cigarette after a meal Planned quit date: not ready to quit Pharmacotherapy: not ready to quit 3 minutes spent on counseling.  He's down to 4-5 cigarettes/day from a habit of 2.5 packs/day.    Essential hypertension Assessment & Plan: Chronic, at goal.  BP Readings from Last 3 Encounters:  04/02/24 114/74  03/01/24 117/70  02/22/24 (!) 110/50   Continue amlodipine  5 mg, imdur  30 mg daily.   Hyperlipidemia, unspecified hyperlipidemia type -     Ezetimibe ; Take 1 tablet (10 mg total) by mouth daily.    Return in about 1 month (around 05/03/2024) for diabetes and routine preventive medicine exam.  Ozell Kung MD 04/02/2024, 5:43 PM

## 2024-04-03 NOTE — Progress Notes (Signed)
 Internal Medicine Clinic Attending  Case discussed with the resident at the time of the visit.  We reviewed the resident's history and exam and pertinent patient test results.  I agree with the assessment, diagnosis, and plan of care documented in the resident's note.

## 2024-04-04 ENCOUNTER — Other Ambulatory Visit: Payer: Self-pay | Admitting: Vascular Surgery

## 2024-04-24 ENCOUNTER — Telehealth: Payer: Self-pay | Admitting: *Deleted

## 2024-04-24 ENCOUNTER — Ambulatory Visit: Admitting: Gastroenterology

## 2024-04-24 ENCOUNTER — Encounter: Payer: Self-pay | Admitting: Gastroenterology

## 2024-04-24 VITALS — BP 118/60 | HR 93 | Ht 71.0 in | Wt 238.0 lb

## 2024-04-24 DIAGNOSIS — Z8719 Personal history of other diseases of the digestive system: Secondary | ICD-10-CM | POA: Diagnosis not present

## 2024-04-24 DIAGNOSIS — K439 Ventral hernia without obstruction or gangrene: Secondary | ICD-10-CM | POA: Diagnosis not present

## 2024-04-24 DIAGNOSIS — Z933 Colostomy status: Secondary | ICD-10-CM

## 2024-04-24 DIAGNOSIS — K2100 Gastro-esophageal reflux disease with esophagitis, without bleeding: Secondary | ICD-10-CM | POA: Diagnosis not present

## 2024-04-24 DIAGNOSIS — I2510 Atherosclerotic heart disease of native coronary artery without angina pectoris: Secondary | ICD-10-CM

## 2024-04-24 DIAGNOSIS — K611 Rectal abscess: Secondary | ICD-10-CM

## 2024-04-24 MED ORDER — PANTOPRAZOLE SODIUM 40 MG PO TBEC: 40.0000 mg | DELAYED_RELEASE_TABLET | Freq: Every day | ORAL | 3 refills | Status: AC

## 2024-04-24 NOTE — Telephone Encounter (Signed)
 RTC to patient to see if he has any further questions.  Copied from CRM [id number]. Topic: Appointments - Appointment Info/Confirmation >> Apr 24, 2024 11:12 AM Miquel SAILOR wrote: Patient/patient representative is calling for information regarding an appointment.   Verified address for Gastro app 09/09

## 2024-04-24 NOTE — Progress Notes (Unsigned)
 HPI : Andres Richardson is a very pleasant 58 year old male with an extensive medical history to include uncontrolled diabetes, coronary artery disease with complete occlusion of the RCA, peripheral artery disease with history of diabetic foot ulcer, osteomyelitis status post SFA stenting and transmetatarsal amputation, chronic tobacco use, chronic kidney disease obstructive sleep apnea, and a history of Fournier's gangrene requiring diverting colostomy for healing who presents to our office for follow up for possible repeat EGD and colonoscopy.  Please see initial HPI below.  He was initially seen in our office in December 2023 to discuss colonoscopy prior to ostomy takedown and to repeat EGD to assess healing of severe esophagitis.  He was scheduled for an EGD and colonoscopy in January, but the procedure was canceled due to hyperglycemia, with blood sugars in the 400s.  He has not rescheduled, and has not been seen in our office since that initial visit.  Since that time, he has had improvement in his hemoglobin A1c, going from 12.9 to 8.3 as of June this year. Also since that time, he was admitted in July of last year for flash pulmonary edema, hypertension, acute kidney injury with nephrotic range proteinuria.  A subsequent kidney biopsy in January of this year showed changes consistent with nodular diabetic glomerulosclerosis.  In October 2024 he was admitted for osteomyelitis of the right lower extremity secondary to diabetic foot ulcers and peripheral vascular disease.  He underwent drug-eluting stent placement in the SFA followed by a transmetatarsal amputation.  In March of this year, he was seen in the emergency department for a perirectal abscess.  He presented with decreased ostomy output and mucus discharge per rectum.  His CT showed a perirectal abscess.  General surgery was consulted and recommended oral antibiotics.  Since then, the patient denies having any similar symptoms suggestive of  perirectal abscess.  He does occasionally experience drainage of scant amounts of mucus and fluid per rectum, sometimes blood-tinged.  No significant pain.  No fevers or chills.  He was also found to have a thyroid  nodules which has undergone surveillance, with most recent ultrasound in June showing enlargement of the nodules.  He has had an ostomy for approximately six years and is considering a reversal.   Preparation for a previous colonoscopy was extremely difficult due to the ostomy, leading to multiple bag changes and significant mess. He is hesitant to undergo another colonoscopy for colon cancer screening, but would consider a colonoscopy if it were necessary for an ostomy reversal.  He has not seen his colorectal surgeon in quite some time, but states that he is going to make an appointment with him to discuss his candidacy for a reversal. He saw cardiology in July of this year, and recommended Myoview prior to any surgeries.  He has a history of severe, LA grade D esophagitis, but currently denies any typical reflux symptoms. He states that he experiences phlegm and a sensation of throat closure three to four times a month, along with daily morning phlegm and a raspy voice. No regular heartburn, except after consuming spicy foods. He was previously on pantoprazole  for severe erosive esophagitis but is not currently taking it. A barium swallow test in 2023 showed esophageal thickening and narrowing. He experiences difficulty swallowing occasionally, with food sometimes feeling stuck, but generally manages to eat without significant issues.       --------------------------------------------------------------------------------  From original HPI July 27, 2022 Patient was originally seen by Dr. Saintclair of [facility] when he was admitted in  April for hematemesis.  An upper endoscopy at that time showed LA grade D esophagitis from 20 to 40 cm along with numerous superficial esophageal ulcers.   Many superficial duodenal ulcers and severe inflammation were seen in the duodenal bulb. Biopsies of the stomach were negative for H. pylori and biopsies of the esophagus showed acute inflammation with necrosis, negative for malignancy. He underwent a barium swallow May 19 which showed irregular segment narrowing of the distal esophagus, with continued ulceration and scarring.  He was seen by Dr. Herschell in July of this year, and although they plan for a repeat upper endoscopy and a colonoscopy, his insurance is not accepted by [facility], so he transferred care to [facility].   Currently, the patient states that he has frequent GERD symptoms to include constant sour taste in his mouth, which is worse at night.  He takes Tums at night which helps a little bit.  He uses a wedge device between his mattress and box spring to elevate his head of bed.  He used to take Protonix , but he ran out of it several months ago.  His dysphagia comes and goes.  Currently he is able to tolerate regular food.  No episodes of having to forcefully vomit stuck food.  His weight is stable.  He has not had any recurrent hematemesis since the episode in April. He sometimes has abdominal pain related to incisional hernias or if he overeats.  Otherwise he denies problems with abdominal pain   He underwent a diverting colostomy 2 years ago in the setting of Fournier's gangrene.  His reversal had been planned for last November, but was delayed because of his significant coronary artery disease and ongoing symptoms of angina and exertional dyspnea. He was seen by his cardiologist (Dr. Loni) September 18 for cardiology clearance prior to his colostomy reversal.  At that visit he was recommended to continue on aspirin , Imdur  and beta-blocker and statin.  He was felt to be a reasonable risk for sedation for endoscopy, but it would not clear him for general anesthesia until they were able to evaluate him within 30 days of his planned  surgery.   The patient denies chest pain to me currently.  He does admit to ongoing significant exertional dyspnea with minimal activity.  He thinks he can go up a flight of stairs, but would likely have to rest multiple times.   The patient passes formed stool through his ostomy daily.  No blood in stool.  He reports having a colonoscopy many years ago when he was in his 30s which she thinks was normal.  He has not had a colonoscopy since then. His father was diagnosed with colon cancer in his 42s.       EGD December 04, 2021 (Dr. Saintclair) Indication: Hematemesis LA grade D esophagitis from 20 to 40 cm, many superficial esophageal ulcers without active bleeding Antral erythema Many superficial ulcers in the duodenal bulb with severe inflammation/erythema Gastric biopsies with chronic gastritis, no H. pylori.  Esophageal ulcer biopsies with acute inflammation and necrosis, negative for malignancy   Barium swallow Jan 01, 2022 Irregular segmental narrowing of the distal esophagus, likely related to scarring..  Tablet showed no passage despite 3 to 4 minutes of observation Fold thickening elsewhere likely related to esophagitis with small volume reflux seen    Past Medical History:  Diagnosis Date   CAD (coronary artery disease) 2022     Prox RCA lesion is 100% stenosed.   Prox Cx  to Mid Cx lesion is 60% stenosed.   LPAV lesion is 60% stenosed.   Ost LAD to Prox LAD lesion is 40% stenosed.   Diabetes mellitus without complication (HCC) 1995   Type !! 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 Diabetic neuropathy.   Flash pulmonary edema (HCC) 02/18/2023   Fournier gangrene 2022   Grade I diastolic dysfunction 12/02/2021   Grade I diastolic dysfunction 12/02/2021   History of creation of ostomy [facility])    Hyperlipidemia    Mixed   Hypertension    Essential   Obesity    Peripheral arterial disease (HCC)    occluded left common iliac artery   Proteinuria    Renal disorder    Thyroid   disease    Tobacco use disorder      Past Surgical History:  Procedure Laterality Date   BIOPSY  12/04/2021   Procedure: BIOPSY;  Surgeon: Saintclair Jasper, MD;  Location: [facility];  Service: Gastroenterology;;   ESOPHAGOGASTRODUODENOSCOPY (EGD) WITH PROPOFOL  N/A 12/04/2021   Procedure: ESOPHAGOGASTRODUODENOSCOPY (EGD) WITH PROPOFOL ;  Surgeon: Saintclair Jasper, MD;  Location: [facility];  Service: Gastroenterology;  Laterality: N/A;   INCISION AND DRAINAGE ABSCESS N/A 02/24/2021   Procedure: INCISION AND DRAINAGE PERINEUM;  Surgeon: Stevie, Herlene Righter, MD;  Location: [facility] ORS;  Service: General;  Laterality: N/A;   INCISION AND DRAINAGE ABSCESS N/A 02/25/2021   Procedure: REPEAT WASHOUT OF PERINEUM;  Surgeon: Stevie Herlene Righter, MD;  Location: [facility];  Service: General;  Laterality: N/A;   LAPAROSCOPIC DIVERTED COLOSTOMY N/A 02/25/2021   Procedure: LAPAROSCOPIC DIVERTING SIGMOID COLOSTOMY;  Surgeon: Stevie Herlene Righter, MD;  Location: [facility];  Service: General;  Laterality: N/A;   LEFT HEART CATH AND CORONARY ANGIOGRAPHY N/A 05/18/2021   Procedure: LEFT HEART CATH AND CORONARY ANGIOGRAPHY;  Surgeon: Court Dorn PARAS, MD;  Location: [facility] INVASIVE CV LAB;  Service: Cardiovascular;  Laterality: N/A;   LOWER EXTREMITY ANGIOGRAPHY Right 06/01/2023   Procedure: Lower Extremity Angiography;  Surgeon: Lanis Fonda BRAVO, MD;  Location: [facility] INVASIVE CV LAB;  Service: Cardiovascular;  Laterality: Right;   PERIPHERAL VASCULAR INTERVENTION Right 06/01/2023   Procedure: PERIPHERAL VASCULAR INTERVENTION;  Surgeon: Lanis Fonda BRAVO, MD;  Location: [facility] INVASIVE CV LAB;  Service: Cardiovascular;  Laterality: Right;  R SFA   TRANSMETATARSAL AMPUTATION Right 06/02/2023   Procedure: TRANSMETATARSAL AMPUTATION;  Surgeon: Gershon Donnice SAUNDERS, DPM;  Location: [facility] OR;  Service: Orthopedics/Podiatry;  Laterality: Right;   Family History  Problem Relation Age of Onset   CAD Mother    Colon cancer Father 72   Social  History   Tobacco Use   Smoking status: Every Day    Current packs/day: 0.25    Types: Cigarettes   Smokeless tobacco: Never   Tobacco comments:    3 cigs per day/Uses Nicorette patch  Vaping Use   Vaping status: Never Used  Substance Use Topics   Alcohol use: Never   Drug use: Never   Current Outpatient Medications  Medication Sig Dispense Refill   acetaminophen  (TYLENOL ) 500 MG tablet Take 2 tablets (1,000 mg total) by mouth every 6 (six) hours as needed for moderate pain (pain score 4-6). 30 tablet 0   amLODipine  (NORVASC ) 5 MG tablet Take 5 mg by mouth every morning.     aspirin  EC 81 MG tablet Take 1 tablet (81 mg total) by mouth daily. Swallow whole. 90 tablet 3   calcium  carbonate (TUMS - DOSED IN MG ELEMENTAL CALCIUM ) 500 MG chewable tablet Chew 1 tablet by mouth  at bedtime. (Patient taking differently: Chew 1 tablet by mouth at bedtime. As needed)     clopidogrel  (PLAVIX ) 75 MG tablet TAKE ONE TABLET (75MG  TOTAL) BY MOUTH DAILY AT 9AM 30 tablet 3   Continuous Glucose Sensor (DEXCOM G7 SENSOR) MISC Apply to the skin to monitor the blood glucose, replace every 10 days. 3 each 11   DULoxetine  (CYMBALTA ) 60 MG capsule Take 1 capsule (60 mg total) by mouth daily.     ezetimibe  (ZETIA ) 10 MG tablet Take 1 tablet (10 mg total) by mouth daily.     furosemide  (LASIX ) 40 MG tablet Take 1 tablet by mouth once daily 90 tablet 0   gabapentin  (NEURONTIN ) 100 MG capsule Take 3 capsules (300 mg total) by mouth 3 (three) times daily. 540 capsule 5   Glucagon  (GVOKE HYPOPEN  1-PACK) 1 MG/0.2ML SOAJ Inject 1 mg into the skin as needed (low blood sugar with impaired consciousness). 0.4 mL 2   Glucose Blood (BLOOD GLUCOSE TEST STRIPS) STRP 1 each by In Vitro route in the morning, at noon, and at bedtime. May substitute to any manufacturer covered by patient's insurance. 300 strip 3   insulin  degludec (TRESIBA ) 100 UNIT/ML FlexTouch Pen Inject 50 Units into the skin at bedtime. (Patient taking  differently: Inject 60 Units into the skin at bedtime.)     Insulin  Pen Needle (BD PEN NEEDLE NANO U/F) 32G X 4 MM MISC 1 Units by Does not apply route at bedtime. 90 each 3   isosorbide  mononitrate (IMDUR ) 30 MG 24 hr tablet Take 1 tablet by mouth once daily 90 tablet 3   KERENDIA 10 MG TABS Take 1 tablet by mouth every morning.     Lancet Device MISC 1 each by Does not apply route in the morning, at noon, and at bedtime. May substitute to any manufacturer covered by patient's insurance. 1 each 0   metFORMIN  (GLUCOPHAGE ) 1000 MG tablet Take 1 tablet (1,000 mg total) by mouth 2 (two) times daily with a meal. 180 tablet 1   nitroGLYCERIN  (NITROSTAT ) 0.4 MG SL tablet Place 1 tablet (0.4 mg total) under the tongue every 5 (five) minutes as needed for chest pain. 20 tablet 3   rosuvastatin  (CRESTOR ) 40 MG tablet Take 1 tablet (40 mg total) by mouth daily. 90 tablet 3   No current facility-administered medications for this visit.   Allergies  Allergen Reactions   Adhesive [Tape] Rash     Review of Systems: All systems reviewed and negative except where noted in HPI.    No results found.  Physical Exam: BP 118/60   Pulse 93   Ht 5' 11 (1.803 m)   Wt 238 lb (108 kg)   SpO2 98%   BMI 33.19 kg/m  Constitutional: Pleasant,well-developed, Caucasian male in no acute distress. HEENT: Normocephalic and atraumatic. Conjunctivae are normal. No scleral icterus. Neck supple.  Cardiovascular: Normal rate, regular rhythm.  Pulmonary/chest: Effort normal and breath sounds normal. No wheezing, rales or rhonchi. Abdominal: Soft, nondistended, nontender. Bowel sounds active throughout. There are no masses palpable. No hepatomegaly.  Ostomy in place, appliance not removed.  Ventral hernia noted, nontender Rectal: Perianal skin appeared chronically inflamed, with hypertrophic and nodular changes.  Perineal scarring noted from patient's history of Fournier's gangrene.  A small punctate papule was noted on  the right buttock lateral to the anal sphincter.  There was no associated swelling or redness.  No drainage or fluid was expressed. Neurological: Alert and oriented to person place and time. Skin:  Skin is warm and dry. No rashes noted. Psychiatric: Normal mood and affect. Behavior is normal.  CBC    Component Value Date/Time   WBC 9.2 11/12/2023 1346   RBC 3.70 (L) 11/12/2023 1346   HGB 10.4 (L) 11/12/2023 1346   HGB 9.3 (L) 06/22/2023 1047   HCT 32.3 (L) 11/12/2023 1346   HCT 30.3 (L) 06/22/2023 1047   PLT 227 11/12/2023 1346   PLT 256 06/22/2023 1047   MCV 87.3 11/12/2023 1346   MCV 84 06/22/2023 1047   MCH 28.1 11/12/2023 1346   MCHC 32.2 11/12/2023 1346   RDW 15.7 (H) 11/12/2023 1346   RDW 15.2 06/22/2023 1047   LYMPHSABS 1.0 11/12/2023 1346   MONOABS 0.6 11/12/2023 1346   EOSABS 0.2 11/12/2023 1346   BASOSABS 0.1 11/12/2023 1346    CMP     Component Value Date/Time   NA 138 03/01/2024 1539   K 4.1 03/01/2024 1539   CL 105 03/01/2024 1539   CO2 20 03/01/2024 1539   GLUCOSE 385 (H) 03/01/2024 1539   GLUCOSE 359 (H) 11/12/2023 1346   BUN 24 03/01/2024 1539   CREATININE 1.58 (H) 03/01/2024 1539   CALCIUM  8.0 (L) 03/01/2024 1539   PROT 6.3 (L) 05/26/2023 2025   PROT 6.1 01/19/2023 1632   ALBUMIN  1.9 (L) 06/06/2023 0856   ALBUMIN  3.4 (L) 01/19/2023 1632   AST 7 (L) 05/26/2023 2025   ALT 7 05/26/2023 2025   ALKPHOS 81 05/26/2023 2025   BILITOT 0.6 05/26/2023 2025   BILITOT <0.2 01/19/2023 1632   GFRNONAA 45 (L) 11/12/2023 1346   GFRAA >60 05/14/2019 0317       Latest Ref Rng & Units 11/12/2023    1:46 PM 08/29/2023    6:49 AM 06/22/2023   10:47 AM  CBC EXTENDED  WBC 4.0 - 10.5 K/uL 9.2  7.1  6.7   RBC 4.22 - 5.81 MIL/uL 3.70  4.28  3.59   Hemoglobin 13.0 - 17.0 g/dL 89.5  88.4  9.3   HCT 60.9 - 52.0 % 32.3  36.3  30.3   Platelets 150 - 400 K/uL 227  218  256   NEUT# 1.7 - 7.7 K/uL 7.4     Lymph# 0.7 - 4.0 K/uL 1.0         ASSESSMENT AND  PLAN:  58 year old male with complicated medical history as outlined above here discussed potential repeat EGD and colonoscopy prior to ostomy takedown.  His GERD symptoms are doing well, and he is having minimal dysphagia symptoms.  Previous barium swallow showed esophageal wall thickening and narrowing, likely related to ongoing esophagitis which was seen on EGD a month prior.  With his improvement in symptoms, this indicates that his reflux is improving.  He is not currently on a PPI.  He should be on PPI indefinitely given the severity of his reflux esophagitis.  He is high risk for sedation given his cardiopulmonary comorbidities.  I recommended we repeat a barium esophagram as a quick and noninvasive way to assess for interval change in the esophageal wall thickening and narrowing noted previously. The patient does not wish to undergo a colonoscopy for colon cancer screening, but he would consider as part of necessary evaluation prior to ostomy takedown.  It is not certain that he is a good surgical candidate for an ostomy takedown.  He needs to follow-up with his colorectal surgeon to discuss this further.  His numerous comorbidities outlined above as well as his hernias would make for a  difficult and risky colonoscopy. He was seen for a perirectal abscess in March of this year, treated with oral antibiotics alone.  He occasionally has symptoms of perianal drainage.  Perianal exam today showed evidence of chronic inflammatory changes, but no obvious fistula and no findings concerning for developing abscess. MRI of the pelvis may need to be considered, especially prior to consideration for ostomy takedown.  The concern for perianal disease, may influence candidacy for ostomy takedown.  Will defer to surgery.   Gastroesophageal reflux disease with severe erosive esophagitis Severe erosive esophagitis likely due to acid reflux. Previously on pantoprazole , not currently listed. - Restart pantoprazole   once daily. - Order repeat barium swallow to assess esophageal condition.  Colostomy status with consideration for reversal Considering ostomy reversal. Requires surgical candidacy assessment, considering comorbidities and potential hernia complications.  Recent history of perirectal abscess and evidence of chronic perianal inflammation likely also influence decision making for ostomy takedown.  Cardiologist requires stress test before surgery. - Follow up with surgeon to assess candidacy for ostomy reversal. - Coordinate with cardiologist for stress test prior to any surgical procedure.  Perianal inflammation and scarring, status post perirectal abscess, with rectal drainage and intermittent bleeding Intermittent rectal drainage and bleeding. Significant perianal inflammation and scarring from previous surgeries. - Advise ER visit if increased drainage, pain, or fever. -Consider pelvic MRI to assess for fistula. - Discuss potential colonoscopy with surgeon for further evaluation, especially if considering ostomy reversal.  Abdominal wall hernias Presence of abdominal wall hernias may complicate potential endoscopic procedures. Reports pain with certain movements.  Follow-Up Requires coordination with multiple specialists for comprehensive care. - Schedule follow-up appointment in 2-3 months to reassess condition and ensure no follow-up items are missed.  Recording duration: 27 minutes     I spent a total of 45 minutes reviewing the patient's medical record, interviewing and examining the patient, discussing his diagnosis and management of his condition going forward, and documenting in the medical record    D'Mello, Rosalyn, DO

## 2024-04-24 NOTE — Patient Instructions (Signed)
 We have sent the following medications to your pharmacy for you to pick up at your convenience:  Pantoprazole    You have been scheduled for a Barium Esophogram at [facility] Radiology (1st floor of the hospital) on 05/07/2024 at 11:00am. Please arrive 30 minutes prior to your appointment for registration. Make certain not to have anything to eat or drink 3 hours prior to your test. If you need to reschedule for any reason, please contact radiology at [phone] to do so. __________________________________________________________________ A barium swallow is an examination that concentrates on views of the esophagus. This tends to be a double contrast exam (barium and two liquids which, when combined, create a gas to distend the wall of the oesophagus) or single contrast (non-ionic iodine  based). The study is usually tailored to your symptoms so a good history is essential. Attention is paid during the study to the form, structure and configuration of the esophagus, looking for functional disorders (such as aspiration, dysphagia, achalasia, motility and reflux) EXAMINATION You may be asked to change into a gown, depending on the type of swallow being performed. A radiologist and radiographer will perform the procedure. The radiologist will advise you of the type of contrast selected for your procedure and direct you during the exam. You will be asked to stand, sit or lie in several different positions and to hold a small amount of fluid in your mouth before being asked to swallow while the imaging is performed .In some instances you may be asked to swallow barium coated marshmallows to assess the motility of a solid food bolus. The exam can be recorded as a digital or video fluoroscopy procedure. POST PROCEDURE It will take 1-2 days for the barium to pass through your system. To facilitate this, it is important, unless otherwise directed, to increase your fluids for the next 24-48hrs and to resume your  normal diet.  This test typically takes about 30 minutes to perform. __________________________________________________________________________________

## 2024-05-02 ENCOUNTER — Encounter: Payer: Self-pay | Admitting: *Deleted

## 2024-05-03 ENCOUNTER — Other Ambulatory Visit: Payer: Self-pay

## 2024-05-03 ENCOUNTER — Ambulatory Visit: Admitting: Student

## 2024-05-03 ENCOUNTER — Encounter: Payer: Self-pay | Admitting: Student

## 2024-05-03 VITALS — BP 114/46 | HR 90 | Temp 98.1°F | Ht 71.0 in | Wt 232.8 lb

## 2024-05-03 DIAGNOSIS — E1165 Type 2 diabetes mellitus with hyperglycemia: Secondary | ICD-10-CM | POA: Diagnosis not present

## 2024-05-03 DIAGNOSIS — I10 Essential (primary) hypertension: Secondary | ICD-10-CM

## 2024-05-03 DIAGNOSIS — Z933 Colostomy status: Secondary | ICD-10-CM

## 2024-05-03 DIAGNOSIS — Z794 Long term (current) use of insulin: Secondary | ICD-10-CM | POA: Diagnosis not present

## 2024-05-03 LAB — GLUCOSE, CAPILLARY: Glucose-Capillary: 334 mg/dL — ABNORMAL HIGH (ref 70–99)

## 2024-05-03 LAB — POCT GLYCOSYLATED HEMOGLOBIN (HGB A1C): HbA1c, POC (controlled diabetic range): 7.7 % — AB (ref 0.0–7.0)

## 2024-05-03 MED ORDER — DEXCOM G7 SENSOR MISC: 11 refills | Status: AC

## 2024-05-03 NOTE — Patient Instructions (Signed)
 Thank you, Mr.Axle V Maddocks for allowing us  to provide your care today. Today we discussed diabetes and blood pressure.    I have sent the referral to the general surgeon.   I have ordered the following labs for you:   Lab Orders         Glucose, capillary         POC Hbg A1C       Referrals ordered today:    Referral Orders         Ambulatory referral to General Surgery      I have ordered the following medication/changed the following medications:   Stop the following medications: Medications Discontinued During This Encounter  Medication Reason   Continuous Glucose Sensor (DEXCOM G7 SENSOR) MISC Reorder     Start the following medications: Meds ordered this encounter  Medications   Continuous Glucose Sensor (DEXCOM G7 SENSOR) MISC    Sig: Apply to the skin to monitor the blood glucose, replace every 10 days.    Dispense:  3 each    Refill:  11     Follow up: 3 months     Should you have any questions or concerns please call the internal medicine clinic at [phone].     Please note that our late policy has changed.  If you are more than 15 minutes late to your appointment, you may be asked to reschedule your appointment.  Dr. Kandis, D.O. [facility]

## 2024-05-03 NOTE — Assessment & Plan Note (Signed)
 Patient presents with a history of hypertension with a blood pressure today of 114/46. Their hypertension is controlled on a regimen of Imdur  30 mg, amlodipine  5mg .   Patient DENIES symptoms of hypotension at home.   Plan: -Continue current regimen of  Imdur  30 mg, amlodipine  5mg 

## 2024-05-03 NOTE — Progress Notes (Signed)
 Established Patient Office Visit  Subjective   Patient ID: Andres Richardson, male    DOB: [birth year]  Age: 58 y.o. MRN: [id number]  Chief Complaint  Patient presents with   Follow-up    3 months    Hypertension   Diabetes    Andres Richardson is a 58 y.o. who presents to the clinic for a follow up HTN, T2DM, and to notify the clinic that he needs a referral to general surgery per GI to assess for reversal of ostomy. Of note, patient is seeing GI for scant blood in the ostomy bag. Please see problem based assessment and plan for additional details.    Patient Active Problem List   Diagnosis Date Noted   Diabetic polyneuropathy associated with type 2 diabetes mellitus (HCC) 04/02/2024   Daytime sleepiness 11/10/2023   Insomnia 07/27/2023   Tobacco use disorder 07/08/2023   History of transmetatarsal amputation of right foot (HCC) 06/23/2023   (HFpEF) heart failure with preserved ejection fraction (HCC) 06/23/2023   Aortic atherosclerosis (HCC) 05/27/2023   PAD (peripheral artery disease) (HCC) 05/26/2023   Thyroid  nodule greater than or equal to 1.5 cm in diameter incidentally noted on imaging study 10/20/2022   Essential hypertension 10/06/2022   Microalbuminuria due to type 2 diabetes mellitus (HCC) 10/06/2022   Chest wall pain, chronic 09/24/2022   Depression 09/10/2022   Iron deficiency anemia 07/30/2022   Uncontrolled type 2 diabetes mellitus with hyperglycemia, with long-term current use of insulin  (HCC) 12/03/2021   Colostomy in place [facility]) 12/03/2021   Hyperlipidemia 05/15/2021   CAD (coronary artery disease) 05/15/2021      Objective:     BP (!) 114/46 (BP Location: Right Arm, Patient Position: Sitting, Cuff Size: Large)   Pulse 90   Temp 98.1 F (36.7 C) (Oral)   Ht 5' 11 (1.803 m)   Wt 232 lb 12.8 oz (105.6 kg)   SpO2 99%   BMI 32.47 kg/m  BP Readings from Last 3 Encounters:  05/03/24 (!) 114/46  04/24/24 118/60  04/02/24 114/74   Wt Readings from  Last 3 Encounters:  05/03/24 232 lb 12.8 oz (105.6 kg)  04/24/24 238 lb (108 kg)  04/02/24 227 lb (103 kg)      Physical Exam Constitutional:      Appearance: He is obese.     Comments: Chronically ill appearing, in a wheel chair  Cardiovascular:     Rate and Rhythm: Normal rate and regular rhythm.     Heart sounds: No murmur heard. Pulmonary:     Effort: Pulmonary effort is normal. No respiratory distress.     Breath sounds: Normal breath sounds. No wheezing or rales.  Abdominal:     Palpations: Abdomen is soft.     Hernia: A hernia is present. Hernia is present in the ventral area.     Comments: LLQ Colostomy in place with scant blood present   Musculoskeletal:     Right lower leg: No edema.     Left lower leg: No edema.  Skin:    General: Skin is warm and dry.  Neurological:     Mental Status: He is alert.  Psychiatric:        Mood and Affect: Mood and affect normal.     Results for orders placed or performed in visit on 05/03/24  Glucose, capillary  Result Value Ref Range   Glucose-Capillary 334 (H) 70 - 99 mg/dL  POC Hbg J8R  Result Value Ref Range   Hemoglobin  A1C     HbA1c POC (<> result, manual entry)     HbA1c, POC (prediabetic range)     HbA1c, POC (controlled diabetic range) 7.7 (A) 0.0 - 7.0 %    Last metabolic panel Lab Results  Component Value Date   GLUCOSE 385 (H) 03/01/2024   NA 138 03/01/2024   K 4.1 03/01/2024   CL 105 03/01/2024   CO2 20 03/01/2024   BUN 24 03/01/2024   CREATININE 1.58 (H) 03/01/2024   EGFR 50 (L) 03/01/2024   CALCIUM  8.0 (L) 03/01/2024   PHOS 3.4 06/06/2023   PROT 6.3 (L) 05/26/2023   ALBUMIN  1.9 (L) 06/06/2023   LABGLOB 2.7 01/19/2023   AGRATIO 1.3 01/19/2023   BILITOT 0.6 05/26/2023   ALKPHOS 81 05/26/2023   AST 7 (L) 05/26/2023   ALT 7 05/26/2023   ANIONGAP 9 11/12/2023   Last lipids Lab Results  Component Value Date   CHOL 80 06/02/2023   HDL 24 (L) 06/02/2023   LDLCALC 30 06/02/2023   LDLDIRECT 60.0  03/23/2023   TRIG 130 06/02/2023   CHOLHDL 3.3 06/02/2023   Last hemoglobin A1c Lab Results  Component Value Date   HGBA1C 7.7 (A) 05/03/2024      The ASCVD Risk score (Arnett DK, et al., 2019) failed to calculate for the following reasons:   Risk score cannot be calculated because patient has a medical history suggesting prior/existing ASCVD    Assessment & Plan:   Problem List Items Addressed This Visit       Cardiovascular and Mediastinum   Essential hypertension   Patient presents with a history of hypertension with a blood pressure today of 114/46. Their hypertension is controlled on a regimen of Imdur  30 mg, amlodipine  5mg .   Patient DENIES symptoms of hypotension at home.   Plan: -Continue current regimen of  Imdur  30 mg, amlodipine  5mg          Endocrine   Uncontrolled type 2 diabetes mellitus with hyperglycemia, with long-term current use of insulin  (HCC) - Primary (Chronic)   Patient presents with a history of T2DM with a prior A1c of  8.3 in June 2025.  Patient's A1c today is 7.7.  They are on a regimen of Lantus  60 units, metformin  1,000 mg BID.  Patient occasional hypoglycemia, with the lowest at 40 4 months prior, no lows since.  Average fasting blood glucose around 180s-210s. Plan: -Continue regimen of: Lantus  60 units, metformin  1,000 mg BID -Patient declines GLP1 today, he is unable to use Jardiance  due to h/o DKA -A1c today -Ophthalmology referral placed at last visit   -CGM reordered       Relevant Medications   Continuous Glucose Sensor (DEXCOM G7 SENSOR) MISC   Other Relevant Orders   POC Hbg A1C (Completed)     Other   Colostomy in place [facility])   Patient wishes to see General surgery to discuss reversal of colostomy. -Referral sent       Relevant Orders   Ambulatory referral to General Surgery    Return in about 3 months (around 08/02/2024) for DM, HTN.    Damien Lease, DO

## 2024-05-03 NOTE — Assessment & Plan Note (Signed)
 Patient wishes to see General surgery to discuss reversal of colostomy. -Referral sent

## 2024-05-03 NOTE — Assessment & Plan Note (Signed)
 Patient presents with a history of T2DM with a prior A1c of  8.3 in June 2025.  Patient's A1c today is 7.7.  They are on a regimen of Lantus  60 units, metformin  1,000 mg BID.  Patient occasional hypoglycemia, with the lowest at 40 4 months prior, no lows since.  Average fasting blood glucose around 180s-210s. Plan: -Continue regimen of: Lantus  60 units, metformin  1,000 mg BID -Patient declines GLP1 today, he is unable to use Jardiance  due to h/o DKA -A1c today -Ophthalmology referral placed at last visit   -CGM reordered

## 2024-05-05 NOTE — Progress Notes (Signed)
 Internal Medicine Clinic Attending  Case discussed with the resident at the time of the visit.  We reviewed the resident's history and exam and pertinent patient test results.  I agree with the assessment, diagnosis, and plan of care documented in the resident's note.

## 2024-05-07 ENCOUNTER — Inpatient Hospital Stay (HOSPITAL_COMMUNITY): Admission: RE | Admit: 2024-05-07 | Source: Ambulatory Visit

## 2024-05-31 ENCOUNTER — Ambulatory Visit (HOSPITAL_COMMUNITY)

## 2024-05-31 ENCOUNTER — Ambulatory Visit

## 2024-06-01 ENCOUNTER — Other Ambulatory Visit: Payer: Self-pay | Admitting: Student

## 2024-06-01 DIAGNOSIS — E1165 Type 2 diabetes mellitus with hyperglycemia: Secondary | ICD-10-CM

## 2024-06-01 NOTE — Telephone Encounter (Signed)
 Medication sent to pharmacy

## 2024-06-28 ENCOUNTER — Ambulatory Visit: Admitting: Gastroenterology

## 2024-06-28 NOTE — Progress Notes (Deleted)
 HPI :    Andres Richardson is a very pleasant 58 year old male with an extensive medical history to include uncontrolled diabetes, coronary artery disease with complete occlusion of the RCA, peripheral artery disease with history of diabetic foot ulcer, osteomyelitis status post SFA stenting and transmetatarsal amputation, chronic tobacco use, chronic kidney disease obstructive sleep apnea, and a history of Fournier's gangrene requiring diverting colostomy for healing who presents to our office for follow up for possible repeat EGD and colonoscopy.  Please see initial HPI below.   He was initially seen in our office in December 2023 to discuss colonoscopy prior to ostomy takedown and to repeat EGD to assess healing of severe esophagitis.  He was scheduled for an EGD and colonoscopy in January, but the procedure was canceled due to hyperglycemia, with blood sugars in the 400s.  He has not rescheduled, and has not been seen in our office since that initial visit.   Since that time, he has had improvement in his hemoglobin A1c, going from 12.9 to 8.3 as of June this year. Also since that time, he was admitted in July of last year for flash pulmonary edema, hypertension, acute kidney injury with nephrotic range proteinuria.  A subsequent kidney biopsy in January of this year showed changes consistent with nodular diabetic glomerulosclerosis.   In October 2024 he was admitted for osteomyelitis of the right lower extremity secondary to diabetic foot ulcers and peripheral vascular disease.  He underwent drug-eluting stent placement in the SFA followed by a transmetatarsal amputation.   In March of this year, he was seen in the emergency department for a perirectal abscess.  He presented with decreased ostomy output and mucus discharge per rectum.  His CT showed a perirectal abscess.  General surgery was consulted and recommended oral antibiotics.  From original HPI July 27, 2022 Patient was originally  seen by Dr. Saintclair of [facility] when he was admitted in April for hematemesis.  An upper endoscopy at that time showed LA grade D esophagitis from 20 to 40 cm along with numerous superficial esophageal ulcers.  Many superficial duodenal ulcers and severe inflammation were seen in the duodenal bulb. Biopsies of the stomach were negative for H. pylori and biopsies of the esophagus showed acute inflammation with necrosis, negative for malignancy. He underwent a barium swallow May 19 which showed irregular segment narrowing of the distal esophagus, with continued ulceration and scarring.  He was seen by Dr. Herschell in July of this year, and although they plan for a repeat upper endoscopy and a colonoscopy, his insurance is not accepted by [facility], so he transferred care to [facility].   Currently, the patient states that he has frequent GERD symptoms to include constant sour taste in his mouth, which is worse at night.  He takes Tums at night which helps a little bit.  He uses a wedge device between his mattress and box spring to elevate his head of bed.  He used to take Protonix , but he ran out of it several months ago.  His dysphagia comes and goes.  Currently he is able to tolerate regular food.  No episodes of having to forcefully vomit stuck food.  His weight is stable.  He has not had any recurrent hematemesis since the episode in April. He sometimes has abdominal pain related to incisional hernias or if he overeats.  Otherwise he denies problems with abdominal pain   He underwent a diverting colostomy 2 years ago in the setting of  Fournier's gangrene.  His reversal had been planned for last November, but was delayed because of his significant coronary artery disease and ongoing symptoms of angina and exertional dyspnea. He was seen by his cardiologist (Dr. Loni) September 18 for cardiology clearance prior to his colostomy reversal.  At that visit he was recommended to continue on aspirin , Imdur  and  beta-blocker and statin.  He was felt to be a reasonable risk for sedation for endoscopy, but it would not clear him for general anesthesia until they were able to evaluate him within 30 days of his planned surgery.   The patient denies chest pain to me currently.  He does admit to ongoing significant exertional dyspnea with minimal activity.  He thinks he can go up a flight of stairs, but would likely have to rest multiple times.   The patient passes formed stool through his ostomy daily.  No blood in stool.  He reports having a colonoscopy many years ago when he was in his 30s which she thinks was normal.  He has not had a colonoscopy since then. His father was diagnosed with colon cancer in his 52s.       EGD December 04, 2021 (Dr. Saintclair) Indication: Hematemesis LA grade D esophagitis from 20 to 40 cm, many superficial esophageal ulcers without active bleeding Antral erythema Many superficial ulcers in the duodenal bulb with severe inflammation/erythema Gastric biopsies with chronic gastritis, no H. pylori.  Esophageal ulcer biopsies with acute inflammation and necrosis, negative for malignancy   Barium swallow Jan 01, 2022 Irregular segmental narrowing of the distal esophagus, likely related to scarring..  Tablet showed no passage despite 3 to 4 minutes of observation Fold thickening elsewhere likely related to esophagitis with small volume reflux seen  Past Medical History:  Diagnosis Date   CAD (coronary artery disease) 2022     Prox RCA lesion is 100% stenosed.   Prox Cx to Mid Cx lesion is 60% stenosed.   LPAV lesion is 60% stenosed.   Ost LAD to Prox LAD lesion is 40% stenosed.   Diabetes mellitus without complication (HCC) 1995   Type !! 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 Diabetic neuropathy.   Flash pulmonary edema (HCC) 02/18/2023   Fournier gangrene 2022   Grade I diastolic dysfunction 12/02/2021   Grade I diastolic dysfunction 12/02/2021   History of creation of  ostomy [facility])    Hyperlipidemia    Mixed   Hypertension    Essential   Obesity    Peripheral arterial disease    occluded left common iliac artery   Proteinuria    Renal disorder    Thyroid  disease    Tobacco use disorder      Past Surgical History:  Procedure Laterality Date   BIOPSY  12/04/2021   Procedure: BIOPSY;  Surgeon: Saintclair Jasper, MD;  Location: [facility] ENDOSCOPY;  Service: Gastroenterology;;   ESOPHAGOGASTRODUODENOSCOPY (EGD) WITH PROPOFOL  N/A 12/04/2021   Procedure: ESOPHAGOGASTRODUODENOSCOPY (EGD) WITH PROPOFOL ;  Surgeon: Saintclair Jasper, MD;  Location: [facility] ENDOSCOPY;  Service: Gastroenterology;  Laterality: N/A;   INCISION AND DRAINAGE ABSCESS N/A 02/24/2021   Procedure: INCISION AND DRAINAGE PERINEUM;  Surgeon: Stevie Herlene Righter, MD;  Location: [facility] ORS;  Service: General;  Laterality: N/A;   INCISION AND DRAINAGE ABSCESS N/A 02/25/2021   Procedure: REPEAT WASHOUT OF PERINEUM;  Surgeon: Stevie Herlene Righter, MD;  Location: [facility] ORS;  Service: General;  Laterality: N/A;   LAPAROSCOPIC DIVERTED COLOSTOMY N/A 02/25/2021   Procedure: LAPAROSCOPIC DIVERTING SIGMOID COLOSTOMY;  Surgeon: Stevie,  Herlene Righter, MD;  Location: [facility] ORS;  Service: General;  Laterality: N/A;   LEFT HEART CATH AND CORONARY ANGIOGRAPHY N/A 05/18/2021   Procedure: LEFT HEART CATH AND CORONARY ANGIOGRAPHY;  Surgeon: Court Dorn PARAS, MD;  Location: [facility] INVASIVE CV LAB;  Service: Cardiovascular;  Laterality: N/A;   LOWER EXTREMITY ANGIOGRAPHY Right 06/01/2023   Procedure: Lower Extremity Angiography;  Surgeon: Lanis Fonda BRAVO, MD;  Location: [facility] INVASIVE CV LAB;  Service: Cardiovascular;  Laterality: Right;   PERIPHERAL VASCULAR INTERVENTION Right 06/01/2023   Procedure: PERIPHERAL VASCULAR INTERVENTION;  Surgeon: Lanis Fonda BRAVO, MD;  Location: [facility] INVASIVE CV LAB;  Service: Cardiovascular;  Laterality: Right;  R SFA   TRANSMETATARSAL AMPUTATION Right 06/02/2023   Procedure: TRANSMETATARSAL AMPUTATION;  Surgeon:  Gershon Donnice SAUNDERS, DPM;  Location: [facility] OR;  Service: Orthopedics/Podiatry;  Laterality: Right;   Family History  Problem Relation Age of Onset   CAD Mother    Colon cancer Father 56   Social History   Tobacco Use   Smoking status: Every Day    Current packs/day: 0.25    Types: Cigarettes   Smokeless tobacco: Never   Tobacco comments:    3 cigs per day/Uses Nicorette patch  Vaping Use   Vaping status: Never Used  Substance Use Topics   Alcohol use: Never   Drug use: Never   Current Outpatient Medications  Medication Sig Dispense Refill   acetaminophen  (TYLENOL ) 500 MG tablet Take 2 tablets (1,000 mg total) by mouth every 6 (six) hours as needed for moderate pain (pain score 4-6). 30 tablet 0   amLODipine  (NORVASC ) 5 MG tablet Take 5 mg by mouth every morning.     aspirin  EC 81 MG tablet Take 1 tablet (81 mg total) by mouth daily. Swallow whole. 90 tablet 3   calcium  carbonate (TUMS - DOSED IN MG ELEMENTAL CALCIUM ) 500 MG chewable tablet Chew 1 tablet by mouth at bedtime. (Patient taking differently: Chew 1 tablet by mouth at bedtime. As needed)     clopidogrel  (PLAVIX ) 75 MG tablet TAKE ONE TABLET (75MG  TOTAL) BY MOUTH DAILY AT 9AM 30 tablet 3   Continuous Glucose Sensor (DEXCOM G7 SENSOR) MISC Apply to the skin to monitor the blood glucose, replace every 10 days. 3 each 11   DULoxetine  (CYMBALTA ) 60 MG capsule Take 1 capsule (60 mg total) by mouth daily.     ezetimibe  (ZETIA ) 10 MG tablet Take 1 tablet (10 mg total) by mouth daily.     furosemide  (LASIX ) 40 MG tablet Take 1 tablet by mouth once daily 90 tablet 0   gabapentin  (NEURONTIN ) 100 MG capsule Take 3 capsules (300 mg total) by mouth 3 (three) times daily. 540 capsule 5   Glucagon  (GVOKE HYPOPEN  1-PACK) 1 MG/0.2ML SOAJ Inject 1 mg into the skin as needed (low blood sugar with impaired consciousness). 0.4 mL 2   Glucose Blood (BLOOD GLUCOSE TEST STRIPS) STRP 1 each by In Vitro route in the morning, at noon, and at bedtime.  May substitute to any manufacturer covered by patient's insurance. 300 strip 3   insulin  degludec (TRESIBA ) 100 UNIT/ML FlexTouch Pen Inject 50 Units into the skin at bedtime. (Patient taking differently: Inject 60 Units into the skin at bedtime.)     Insulin  Pen Needle (BD PEN NEEDLE NANO U/F) 32G X 4 MM MISC 1 Units by Does not apply route at bedtime. 90 each 3   isosorbide  mononitrate (IMDUR ) 30 MG 24 hr tablet Take 1 tablet by mouth once daily 90  tablet 3   KERENDIA 10 MG TABS Take 1 tablet by mouth every morning.     Lancet Device MISC 1 each by Does not apply route in the morning, at noon, and at bedtime. May substitute to any manufacturer covered by patient's insurance. 1 each 0   metFORMIN  (GLUCOPHAGE ) 1000 MG tablet TAKE ONE TABLET (1000 MG TOTAL) BY MOUTH TWICE DAILY AT 9AM & 5PM WITH A MEAL 180 tablet 11   nitroGLYCERIN  (NITROSTAT ) 0.4 MG SL tablet Place 1 tablet (0.4 mg total) under the tongue every 5 (five) minutes as needed for chest pain. 20 tablet 3   pantoprazole  (PROTONIX ) 40 MG tablet Take 1 tablet (40 mg total) by mouth daily. 90 tablet 3   rosuvastatin  (CRESTOR ) 40 MG tablet Take 1 tablet (40 mg total) by mouth daily. 90 tablet 3   No current facility-administered medications for this visit.   Allergies  Allergen Reactions   Jardiance  [Empagliflozin ]     DKA in the past   Adhesive [Tape] Rash     Review of Systems: All systems reviewed and negative except where noted in HPI.    No results found.  Physical Exam: There were no vitals taken for this visit. Constitutional: Pleasant,well-developed, ***male in no acute distress. HEENT: Normocephalic and atraumatic. Conjunctivae are normal. No scleral icterus. Neck supple.  Cardiovascular: Normal rate, regular rhythm.  Pulmonary/chest: Effort normal and breath sounds normal. No wheezing, rales or rhonchi. Abdominal: Soft, nondistended, nontender. Bowel sounds active throughout. There are no masses palpable. No  hepatomegaly. Extremities: no edema Lymphadenopathy: No cervical adenopathy noted. Neurological: Alert and oriented to person place and time. Skin: Skin is warm and dry. No rashes noted. Psychiatric: Normal mood and affect. Behavior is normal.  CBC    Component Value Date/Time   WBC 9.2 11/12/2023 1346   RBC 3.70 (L) 11/12/2023 1346   HGB 10.4 (L) 11/12/2023 1346   HGB 9.3 (L) 06/22/2023 1047   HCT 32.3 (L) 11/12/2023 1346   HCT 30.3 (L) 06/22/2023 1047   PLT 227 11/12/2023 1346   PLT 256 06/22/2023 1047   MCV 87.3 11/12/2023 1346   MCV 84 06/22/2023 1047   MCH 28.1 11/12/2023 1346   MCHC 32.2 11/12/2023 1346   RDW 15.7 (H) 11/12/2023 1346   RDW 15.2 06/22/2023 1047   LYMPHSABS 1.0 11/12/2023 1346   MONOABS 0.6 11/12/2023 1346   EOSABS 0.2 11/12/2023 1346   BASOSABS 0.1 11/12/2023 1346    CMP     Component Value Date/Time   NA 138 03/01/2024 1539   K 4.1 03/01/2024 1539   CL 105 03/01/2024 1539   CO2 20 03/01/2024 1539   GLUCOSE 385 (H) 03/01/2024 1539   GLUCOSE 359 (H) 11/12/2023 1346   BUN 24 03/01/2024 1539   CREATININE 1.58 (H) 03/01/2024 1539   CALCIUM  8.0 (L) 03/01/2024 1539   PROT 6.3 (L) 05/26/2023 2025   PROT 6.1 01/19/2023 1632   ALBUMIN  1.9 (L) 06/06/2023 0856   ALBUMIN  3.4 (L) 01/19/2023 1632   AST 7 (L) 05/26/2023 2025   ALT 7 05/26/2023 2025   ALKPHOS 81 05/26/2023 2025   BILITOT 0.6 05/26/2023 2025   BILITOT <0.2 01/19/2023 1632   GFRNONAA 45 (L) 11/12/2023 1346   GFRAA >60 05/14/2019 0317       Latest Ref Rng & Units 11/12/2023    1:46 PM 08/29/2023    6:49 AM 06/22/2023   10:47 AM  CBC EXTENDED  WBC 4.0 - 10.5 K/uL 9.2  7.1  6.7   RBC 4.22 - 5.81 MIL/uL 3.70  4.28  3.59   Hemoglobin 13.0 - 17.0 g/dL 89.5  88.4  9.3   HCT 60.9 - 52.0 % 32.3  36.3  30.3   Platelets 150 - 400 K/uL 227  218  256   NEUT# 1.7 - 7.7 K/uL 7.4     Lymph# 0.7 - 4.0 K/uL 1.0         ASSESSMENT AND PLAN:  D'Mello, Rosalyn, DO

## 2024-07-30 ENCOUNTER — Other Ambulatory Visit: Payer: Self-pay | Admitting: *Deleted

## 2024-07-30 MED ORDER — PEN NEEDLES 32G X 4 MM MISC: 3 refills | Status: AC

## 2024-08-06 ENCOUNTER — Other Ambulatory Visit: Payer: Self-pay | Admitting: Vascular Surgery

## 2024-08-23 ENCOUNTER — Telehealth: Payer: Self-pay | Admitting: *Deleted

## 2024-08-23 NOTE — Telephone Encounter (Signed)
 Patient is on Dr London wait list for hospital colonoscopy to be completed.  Upon further chart review, patient was originally to have endoscopy and colonoscopy in the hospital setting as ordered at 07/27/22 office visit with Dr Cunningham(for colonoscopy via ostomy with pouchoscopy prior to ostomy takedown and further evaluation of GERD with esophagitis and dysphagia). Patient was scheduled for 08/30/22. This appointment was cancelled same day due to hyperglycemia (glucose in the 400s).  On 09/15/23, a voicemail was left asking patient to call back to schedule return office visit with Dr Stacia for reassessment and discussion of rescheduling endoscopy and colonoscopy.   A letter was mailed to patient on 02/20/24 requesting patient call office for visit.  Patient called back 02/27/24 and scheduled office visit with Dr Stacia for 04/24/24.  04/24/24 GI office visit details multiple hospital visits and medical complications from 2023 to 04/2024. There was discussion of endoscopy and colonoscopy being ordered again at the hospital, however, it was decided that patient should have a barium swallow first for assessment of the esophagus. Patient was also advised he should also see general surgery to discuss his candidacy for ostomy reversal along with seeing cardiology for stress testing prior to any surgical procedure.   Furthermore, patient was to follow up with Dr Stacia in the office 2-3 months from 04/2024 appointment for reassessment and to ensure no follow-up items were missed.   Barium swallow was ordered but never completed.  Patient was scheduled for 06/28/24 appointment with Dr Stacia, but no showed that appointment.  In the interm, patient did see Dr Herlene Bureau at [facility] on 06/15/24 at which time he was advised that he should go ahead and schedule colonoscopy to assess bleeding causes/colon condition and should also have a cardiac stress test to evaluate  cardiac function.  Unfortunately, to date, it does not appear patient has follow up with cardiology for stress testing needed to proceed with colonoscopy procedure.  I have left a voicemail for patient to call back. If he is still interested in colonoscopy, he will need to have cardiac testing as previously recommended and should also see Dr Stacia again in the office prior to rescheduling.

## 2024-08-29 ENCOUNTER — Other Ambulatory Visit: Payer: Self-pay | Admitting: Student

## 2024-08-29 DIAGNOSIS — I25119 Atherosclerotic heart disease of native coronary artery with unspecified angina pectoris: Secondary | ICD-10-CM

## 2024-08-29 DIAGNOSIS — I5033 Acute on chronic diastolic (congestive) heart failure: Secondary | ICD-10-CM

## 2024-08-29 DIAGNOSIS — E785 Hyperlipidemia, unspecified: Secondary | ICD-10-CM

## 2024-08-29 DIAGNOSIS — E7800 Pure hypercholesterolemia, unspecified: Secondary | ICD-10-CM

## 2024-08-29 DIAGNOSIS — I739 Peripheral vascular disease, unspecified: Secondary | ICD-10-CM

## 2024-08-29 DIAGNOSIS — I10 Essential (primary) hypertension: Secondary | ICD-10-CM

## 2024-08-29 DIAGNOSIS — E1142 Type 2 diabetes mellitus with diabetic polyneuropathy: Secondary | ICD-10-CM

## 2024-08-29 DIAGNOSIS — G4733 Obstructive sleep apnea (adult) (pediatric): Secondary | ICD-10-CM

## 2024-08-29 DIAGNOSIS — E119 Type 2 diabetes mellitus without complications: Secondary | ICD-10-CM

## 2024-08-30 NOTE — Telephone Encounter (Signed)
 Patient last seen 05/03/24 I called the patient to schedule a appointment. I was unable to reach the patient. I lvm for him to give us  a call back.

## 2024-09-06 ENCOUNTER — Ambulatory Visit

## 2024-09-06 ENCOUNTER — Ambulatory Visit (HOSPITAL_COMMUNITY)

## 2024-09-06 ENCOUNTER — Ambulatory Visit (HOSPITAL_COMMUNITY): Attending: Physician Assistant
